# Patient Record
Sex: Female | Born: 1935
Health system: Southern US, Community
[De-identification: ages and names within clinical notes are randomized; demographics above are authoritative.]

## PROBLEM LIST (undated history)

## (undated) DIAGNOSIS — Z8619 Personal history of other infectious and parasitic diseases: Secondary | ICD-10-CM

## (undated) DIAGNOSIS — I10 Essential (primary) hypertension: Secondary | ICD-10-CM

## (undated) DIAGNOSIS — R001 Bradycardia, unspecified: Secondary | ICD-10-CM

## (undated) DIAGNOSIS — N393 Stress incontinence (female) (male): Secondary | ICD-10-CM

## (undated) DIAGNOSIS — I5189 Other ill-defined heart diseases: Secondary | ICD-10-CM

## (undated) DIAGNOSIS — K810 Acute cholecystitis: Secondary | ICD-10-CM

## (undated) DIAGNOSIS — N179 Acute kidney failure, unspecified: Secondary | ICD-10-CM

## (undated) DIAGNOSIS — M1990 Unspecified osteoarthritis, unspecified site: Secondary | ICD-10-CM

## (undated) DIAGNOSIS — N200 Calculus of kidney: Secondary | ICD-10-CM

## (undated) DIAGNOSIS — N201 Calculus of ureter: Secondary | ICD-10-CM

## (undated) DIAGNOSIS — M069 Rheumatoid arthritis, unspecified: Secondary | ICD-10-CM

## (undated) DIAGNOSIS — I519 Heart disease, unspecified: Secondary | ICD-10-CM

## (undated) DIAGNOSIS — D259 Leiomyoma of uterus, unspecified: Secondary | ICD-10-CM

## (undated) DIAGNOSIS — D649 Anemia, unspecified: Secondary | ICD-10-CM

## (undated) DIAGNOSIS — E876 Hypokalemia: Secondary | ICD-10-CM

## (undated) DIAGNOSIS — K5730 Diverticulosis of large intestine without perforation or abscess without bleeding: Secondary | ICD-10-CM

## (undated) DIAGNOSIS — J302 Other seasonal allergic rhinitis: Secondary | ICD-10-CM

## (undated) DIAGNOSIS — M359 Systemic involvement of connective tissue, unspecified: Secondary | ICD-10-CM

## (undated) DIAGNOSIS — N182 Chronic kidney disease, stage 2 (mild): Secondary | ICD-10-CM

## (undated) DIAGNOSIS — D175 Benign lipomatous neoplasm of intra-abdominal organs: Secondary | ICD-10-CM

## (undated) DIAGNOSIS — M47816 Spondylosis without myelopathy or radiculopathy, lumbar region: Secondary | ICD-10-CM

## (undated) DIAGNOSIS — E782 Mixed hyperlipidemia: Secondary | ICD-10-CM

## (undated) DIAGNOSIS — I499 Cardiac arrhythmia, unspecified: Secondary | ICD-10-CM

## (undated) DIAGNOSIS — B0230 Zoster ocular disease, unspecified: Secondary | ICD-10-CM

## (undated) DIAGNOSIS — K219 Gastro-esophageal reflux disease without esophagitis: Secondary | ICD-10-CM

## (undated) DIAGNOSIS — Z87898 Personal history of other specified conditions: Secondary | ICD-10-CM

## (undated) DIAGNOSIS — M810 Age-related osteoporosis without current pathological fracture: Secondary | ICD-10-CM

## (undated) DIAGNOSIS — E119 Type 2 diabetes mellitus without complications: Secondary | ICD-10-CM

## (undated) HISTORY — DX: Rheumatoid arthritis, unspecified: M06.9

## (undated) HISTORY — DX: Bradycardia, unspecified: R00.1

## (undated) HISTORY — DX: Unspecified osteoarthritis, unspecified site: M19.90

## (undated) HISTORY — DX: Mixed hyperlipidemia: E78.2

## (undated) HISTORY — PX: EYE SURGERY: SHX253

## (undated) HISTORY — PX: POSTERIOR LAMINECTOMY / DECOMPRESSION LUMBAR SPINE: SUR740

## (undated) HISTORY — PX: SHOULDER ARTHROSCOPY WITH DISTAL CLAVICLE RESECTION: SHX5675

## (undated) HISTORY — PX: LUMBAR MICRODISCECTOMY: SHX99

## (undated) HISTORY — DX: Essential (primary) hypertension: I10

## (undated) HISTORY — PX: SPINE SURGERY: SHX786

## (undated) HISTORY — DX: Gastro-esophageal reflux disease without esophagitis: K21.9

## (undated) HISTORY — PX: OTHER SURGICAL HISTORY: SHX169

## (undated) HISTORY — DX: Heart disease, unspecified: I51.9

## (undated) HISTORY — DX: Type 2 diabetes mellitus without complications: E11.9

---

## 1998-08-09 ENCOUNTER — Other Ambulatory Visit: Admission: RE | Admit: 1998-08-09 | Discharge: 1998-08-09 | Payer: Self-pay | Admitting: Obstetrics and Gynecology

## 2000-01-06 HISTORY — PX: OTHER SURGICAL HISTORY: SHX169

## 2000-05-26 ENCOUNTER — Other Ambulatory Visit: Admission: RE | Admit: 2000-05-26 | Discharge: 2000-05-26 | Payer: Self-pay | Admitting: Family Medicine

## 2000-06-23 ENCOUNTER — Encounter (HOSPITAL_COMMUNITY): Admission: RE | Admit: 2000-06-23 | Discharge: 2000-07-23 | Payer: Self-pay | Admitting: Family Medicine

## 2000-07-03 ENCOUNTER — Emergency Department (HOSPITAL_COMMUNITY): Admission: EM | Admit: 2000-07-03 | Discharge: 2000-07-04 | Payer: Self-pay | Admitting: *Deleted

## 2000-07-04 ENCOUNTER — Encounter: Payer: Self-pay | Admitting: *Deleted

## 2000-07-05 ENCOUNTER — Encounter: Payer: Self-pay | Admitting: Family Medicine

## 2000-07-05 ENCOUNTER — Ambulatory Visit (HOSPITAL_COMMUNITY): Admission: RE | Admit: 2000-07-05 | Discharge: 2000-07-05 | Payer: Self-pay | Admitting: Family Medicine

## 2000-07-06 ENCOUNTER — Encounter: Payer: Self-pay | Admitting: Urology

## 2000-07-06 ENCOUNTER — Encounter: Admission: RE | Admit: 2000-07-06 | Discharge: 2000-07-06 | Payer: Self-pay | Admitting: Urology

## 2000-07-11 ENCOUNTER — Emergency Department (HOSPITAL_COMMUNITY): Admission: EM | Admit: 2000-07-11 | Discharge: 2000-07-11 | Payer: Self-pay | Admitting: Emergency Medicine

## 2000-07-14 ENCOUNTER — Encounter: Payer: Self-pay | Admitting: Neurosurgery

## 2000-07-16 ENCOUNTER — Encounter: Payer: Self-pay | Admitting: Neurosurgery

## 2000-07-16 ENCOUNTER — Observation Stay (HOSPITAL_COMMUNITY): Admission: RE | Admit: 2000-07-16 | Discharge: 2000-07-17 | Payer: Self-pay | Admitting: Neurosurgery

## 2000-09-07 ENCOUNTER — Ambulatory Visit (HOSPITAL_COMMUNITY): Admission: RE | Admit: 2000-09-07 | Discharge: 2000-09-07 | Payer: Self-pay | Admitting: *Deleted

## 2000-09-07 ENCOUNTER — Encounter: Payer: Self-pay | Admitting: Family Medicine

## 2002-01-19 ENCOUNTER — Ambulatory Visit (HOSPITAL_COMMUNITY): Admission: RE | Admit: 2002-01-19 | Discharge: 2002-01-19 | Payer: Self-pay | Admitting: Family Medicine

## 2002-01-19 ENCOUNTER — Encounter: Payer: Self-pay | Admitting: Family Medicine

## 2002-04-26 ENCOUNTER — Other Ambulatory Visit: Admission: RE | Admit: 2002-04-26 | Discharge: 2002-04-26 | Payer: Self-pay | Admitting: Dermatology

## 2002-12-05 ENCOUNTER — Ambulatory Visit (HOSPITAL_COMMUNITY): Admission: RE | Admit: 2002-12-05 | Discharge: 2002-12-05 | Payer: Self-pay | Admitting: Orthopedic Surgery

## 2003-01-25 ENCOUNTER — Ambulatory Visit (HOSPITAL_COMMUNITY): Admission: RE | Admit: 2003-01-25 | Discharge: 2003-01-25 | Payer: Self-pay | Admitting: Family Medicine

## 2003-08-02 ENCOUNTER — Ambulatory Visit (HOSPITAL_COMMUNITY): Admission: RE | Admit: 2003-08-02 | Discharge: 2003-08-02 | Payer: Self-pay | Admitting: Family Medicine

## 2003-09-06 ENCOUNTER — Ambulatory Visit (HOSPITAL_COMMUNITY): Admission: RE | Admit: 2003-09-06 | Discharge: 2003-09-06 | Payer: Self-pay | Admitting: Gastroenterology

## 2004-01-28 ENCOUNTER — Ambulatory Visit (HOSPITAL_COMMUNITY): Admission: RE | Admit: 2004-01-28 | Discharge: 2004-01-28 | Payer: Self-pay | Admitting: Family Medicine

## 2004-09-05 ENCOUNTER — Encounter: Payer: Self-pay | Admitting: Family Medicine

## 2005-04-09 ENCOUNTER — Ambulatory Visit (HOSPITAL_COMMUNITY): Admission: RE | Admit: 2005-04-09 | Discharge: 2005-04-09 | Payer: Self-pay | Admitting: Family Medicine

## 2006-04-12 ENCOUNTER — Ambulatory Visit (HOSPITAL_COMMUNITY): Admission: RE | Admit: 2006-04-12 | Discharge: 2006-04-12 | Payer: Self-pay | Admitting: Family Medicine

## 2007-01-06 ENCOUNTER — Encounter: Payer: Self-pay | Admitting: Family Medicine

## 2007-01-06 HISTORY — PX: CATARACT EXTRACTION W/ INTRAOCULAR LENS  IMPLANT, BILATERAL: SHX1307

## 2007-01-06 HISTORY — PX: OTHER SURGICAL HISTORY: SHX169

## 2007-04-21 ENCOUNTER — Ambulatory Visit (HOSPITAL_COMMUNITY): Admission: RE | Admit: 2007-04-21 | Discharge: 2007-04-21 | Payer: Self-pay | Admitting: Family Medicine

## 2007-08-22 ENCOUNTER — Encounter: Payer: Self-pay | Admitting: Family Medicine

## 2008-01-06 HISTORY — PX: EXCISION BONE CYST: SHX6616

## 2008-01-06 HISTORY — PX: OTHER SURGICAL HISTORY: SHX169

## 2008-04-20 ENCOUNTER — Encounter: Payer: Self-pay | Admitting: Family Medicine

## 2008-04-23 ENCOUNTER — Ambulatory Visit (HOSPITAL_COMMUNITY): Admission: RE | Admit: 2008-04-23 | Discharge: 2008-04-23 | Payer: Self-pay | Admitting: Family Medicine

## 2008-05-15 ENCOUNTER — Encounter: Payer: Self-pay | Admitting: Family Medicine

## 2008-05-23 ENCOUNTER — Ambulatory Visit: Payer: Self-pay | Admitting: Family Medicine

## 2008-05-23 DIAGNOSIS — E785 Hyperlipidemia, unspecified: Secondary | ICD-10-CM | POA: Insufficient documentation

## 2008-05-23 DIAGNOSIS — I10 Essential (primary) hypertension: Secondary | ICD-10-CM

## 2008-05-23 DIAGNOSIS — E782 Mixed hyperlipidemia: Secondary | ICD-10-CM | POA: Insufficient documentation

## 2008-05-24 ENCOUNTER — Encounter: Payer: Self-pay | Admitting: Family Medicine

## 2008-05-24 LAB — CONVERTED CEMR LAB
Candida species: NEGATIVE
Chlamydia, DNA Probe: NEGATIVE
GC Probe Amp, Genital: NEGATIVE
Gardnerella vaginalis: NEGATIVE
Trichomonal Vaginitis: NEGATIVE

## 2008-05-30 ENCOUNTER — Encounter: Payer: Self-pay | Admitting: Family Medicine

## 2008-05-31 ENCOUNTER — Encounter: Payer: Self-pay | Admitting: Family Medicine

## 2008-06-29 ENCOUNTER — Encounter: Payer: Self-pay | Admitting: Family Medicine

## 2008-06-29 ENCOUNTER — Other Ambulatory Visit: Admission: RE | Admit: 2008-06-29 | Discharge: 2008-06-29 | Payer: Self-pay | Admitting: Family Medicine

## 2008-06-29 ENCOUNTER — Ambulatory Visit: Payer: Self-pay | Admitting: Family Medicine

## 2008-07-02 ENCOUNTER — Telehealth: Payer: Self-pay | Admitting: Family Medicine

## 2008-07-06 ENCOUNTER — Telehealth: Payer: Self-pay | Admitting: Family Medicine

## 2008-07-16 ENCOUNTER — Encounter: Payer: Self-pay | Admitting: Family Medicine

## 2008-07-25 ENCOUNTER — Ambulatory Visit: Payer: Self-pay | Admitting: Family Medicine

## 2008-08-27 ENCOUNTER — Encounter: Admission: RE | Admit: 2008-08-27 | Discharge: 2008-08-27 | Payer: Self-pay | Admitting: Surgery

## 2008-08-30 ENCOUNTER — Encounter (INDEPENDENT_AMBULATORY_CARE_PROVIDER_SITE_OTHER): Payer: Self-pay | Admitting: Surgery

## 2008-08-30 ENCOUNTER — Ambulatory Visit (HOSPITAL_BASED_OUTPATIENT_CLINIC_OR_DEPARTMENT_OTHER): Admission: RE | Admit: 2008-08-30 | Discharge: 2008-08-30 | Payer: Self-pay | Admitting: Surgery

## 2008-08-30 ENCOUNTER — Encounter: Payer: Self-pay | Admitting: Family Medicine

## 2008-08-31 ENCOUNTER — Telehealth: Payer: Self-pay | Admitting: Family Medicine

## 2008-09-12 ENCOUNTER — Ambulatory Visit: Payer: Self-pay | Admitting: Family Medicine

## 2008-09-16 DIAGNOSIS — M0579 Rheumatoid arthritis with rheumatoid factor of multiple sites without organ or systems involvement: Secondary | ICD-10-CM

## 2008-09-16 DIAGNOSIS — B009 Herpesviral infection, unspecified: Secondary | ICD-10-CM

## 2008-12-06 ENCOUNTER — Encounter: Payer: Self-pay | Admitting: Family Medicine

## 2008-12-07 DIAGNOSIS — R5381 Other malaise: Secondary | ICD-10-CM

## 2008-12-07 DIAGNOSIS — R5383 Other fatigue: Secondary | ICD-10-CM

## 2008-12-14 LAB — CONVERTED CEMR LAB
ALT: 12 units/L (ref 0–35)
AST: 19 units/L (ref 0–37)
Albumin: 4.5 g/dL (ref 3.5–5.2)
Alkaline Phosphatase: 64 units/L (ref 39–117)
CO2: 28 meq/L (ref 19–32)
Calcium: 10.2 mg/dL (ref 8.4–10.5)
Cholesterol: 155 mg/dL (ref 0–200)
Creatinine, Ser: 1.18 mg/dL (ref 0.40–1.20)
HDL: 59 mg/dL (ref >39)
Total Bilirubin: 0.6 mg/dL (ref 0.3–1.2)

## 2008-12-21 ENCOUNTER — Encounter: Payer: Self-pay | Admitting: Family Medicine

## 2008-12-24 ENCOUNTER — Encounter: Payer: Self-pay | Admitting: Family Medicine

## 2008-12-26 ENCOUNTER — Encounter: Payer: Self-pay | Admitting: Family Medicine

## 2009-01-05 HISTORY — PX: OTHER SURGICAL HISTORY: SHX169

## 2009-01-10 ENCOUNTER — Encounter: Payer: Self-pay | Admitting: Family Medicine

## 2009-02-08 ENCOUNTER — Ambulatory Visit: Payer: Self-pay | Admitting: Family Medicine

## 2009-04-22 ENCOUNTER — Ambulatory Visit: Payer: Self-pay | Admitting: Family Medicine

## 2009-04-22 DIAGNOSIS — IMO0002 Reserved for concepts with insufficient information to code with codable children: Secondary | ICD-10-CM

## 2009-05-10 ENCOUNTER — Encounter: Payer: Self-pay | Admitting: Family Medicine

## 2009-05-23 ENCOUNTER — Ambulatory Visit: Payer: Self-pay | Admitting: Family Medicine

## 2009-05-23 DIAGNOSIS — R9431 Abnormal electrocardiogram [ECG] [EKG]: Secondary | ICD-10-CM

## 2009-05-23 DIAGNOSIS — I495 Sick sinus syndrome: Secondary | ICD-10-CM

## 2009-05-23 LAB — CONVERTED CEMR LAB
Albumin: 4.5 g/dL (ref 3.5–5.2)
Alkaline Phosphatase: 71 units/L (ref 39–117)
BUN: 28 mg/dL — ABNORMAL HIGH (ref 6–23)
Bilirubin, Direct: 0.1 mg/dL (ref 0.0–0.3)
CO2: 26 meq/L (ref 19–32)
Chloride: 102 meq/L (ref 96–112)
Creatinine, Ser: 1.35 mg/dL — ABNORMAL HIGH (ref 0.40–1.20)
Eosinophils Absolute: 0.2 10*3/uL (ref 0.0–0.7)
Eosinophils Relative: 3 % (ref 0–5)
Glucose, Bld: 104 mg/dL — ABNORMAL HIGH (ref 70–99)
HCT: 38.3 % (ref 36.0–46.0)
Indirect Bilirubin: 0.5 mg/dL (ref 0.0–0.9)
LDL Cholesterol: 63 mg/dL (ref 0–99)
Lymphs Abs: 1.5 10*3/uL (ref 0.7–4.0)
MCHC: 32.6 g/dL (ref 30.0–36.0)
MCV: 93.2 fL (ref 78.0–100.0)
Monocytes Relative: 8 % (ref 3–12)
Platelets: 161 10*3/uL (ref 150–400)
RBC: 4.11 M/uL (ref 3.87–5.11)
Total Bilirubin: 0.6 mg/dL (ref 0.3–1.2)
WBC: 4.9 10*3/uL (ref 4.0–10.5)

## 2009-05-24 ENCOUNTER — Ambulatory Visit (HOSPITAL_COMMUNITY): Admission: RE | Admit: 2009-05-24 | Discharge: 2009-05-24 | Payer: Self-pay | Admitting: Family Medicine

## 2009-05-24 ENCOUNTER — Encounter: Payer: Self-pay | Admitting: Family Medicine

## 2009-05-28 ENCOUNTER — Encounter: Payer: Self-pay | Admitting: Family Medicine

## 2009-05-28 ENCOUNTER — Telehealth: Payer: Self-pay | Admitting: Family Medicine

## 2009-05-29 ENCOUNTER — Encounter: Payer: Self-pay | Admitting: Family Medicine

## 2009-06-19 ENCOUNTER — Encounter: Payer: Self-pay | Admitting: Family Medicine

## 2009-06-26 ENCOUNTER — Inpatient Hospital Stay (HOSPITAL_COMMUNITY): Admission: RE | Admit: 2009-06-26 | Discharge: 2009-07-01 | Payer: Self-pay | Admitting: Specialist

## 2009-06-27 ENCOUNTER — Encounter: Payer: Self-pay | Admitting: Family Medicine

## 2009-06-27 ENCOUNTER — Ambulatory Visit: Payer: Self-pay | Admitting: Physical Medicine & Rehabilitation

## 2009-07-09 ENCOUNTER — Encounter: Payer: Self-pay | Admitting: Family Medicine

## 2009-07-25 ENCOUNTER — Ambulatory Visit: Payer: Self-pay | Admitting: Family Medicine

## 2009-07-25 DIAGNOSIS — E119 Type 2 diabetes mellitus without complications: Secondary | ICD-10-CM | POA: Insufficient documentation

## 2009-08-02 ENCOUNTER — Telehealth: Payer: Self-pay | Admitting: Family Medicine

## 2009-08-13 ENCOUNTER — Encounter: Payer: Self-pay | Admitting: Family Medicine

## 2009-08-28 ENCOUNTER — Ambulatory Visit: Payer: Self-pay | Admitting: Family Medicine

## 2009-08-28 DIAGNOSIS — M6281 Muscle weakness (generalized): Secondary | ICD-10-CM

## 2009-09-03 ENCOUNTER — Encounter (HOSPITAL_COMMUNITY): Admission: RE | Admit: 2009-09-03 | Discharge: 2009-10-03 | Payer: Self-pay | Admitting: Family Medicine

## 2009-09-03 ENCOUNTER — Encounter: Payer: Self-pay | Admitting: Family Medicine

## 2009-10-02 ENCOUNTER — Encounter: Payer: Self-pay | Admitting: Family Medicine

## 2009-10-04 ENCOUNTER — Encounter (HOSPITAL_COMMUNITY): Admission: RE | Admit: 2009-10-04 | Discharge: 2009-10-04 | Payer: Self-pay | Admitting: Family Medicine

## 2009-10-07 ENCOUNTER — Encounter (HOSPITAL_COMMUNITY): Admission: RE | Admit: 2009-10-07 | Discharge: 2009-11-06 | Payer: Self-pay | Admitting: Family Medicine

## 2009-10-21 ENCOUNTER — Encounter: Payer: Self-pay | Admitting: Family Medicine

## 2009-10-24 LAB — CONVERTED CEMR LAB
BUN: 27 mg/dL — ABNORMAL HIGH (ref 6–23)
Chloride: 105 meq/L (ref 96–112)
Glucose, Bld: 101 mg/dL — ABNORMAL HIGH (ref 70–99)
Indirect Bilirubin: 0.5 mg/dL (ref 0.0–0.9)
LDL Cholesterol: 71 mg/dL (ref 0–99)
Potassium: 3.6 meq/L (ref 3.5–5.3)
Total Protein: 6.7 g/dL (ref 6.0–8.3)
Triglycerides: 102 mg/dL (ref <150)
VLDL: 20 mg/dL (ref 0–40)

## 2009-10-28 ENCOUNTER — Ambulatory Visit: Payer: Self-pay | Admitting: Family Medicine

## 2009-11-18 ENCOUNTER — Telehealth: Payer: Self-pay | Admitting: Family Medicine

## 2009-12-31 ENCOUNTER — Telehealth: Payer: Self-pay | Admitting: Family Medicine

## 2010-01-05 HISTORY — PX: BUNIONECTOMY: SHX129

## 2010-01-26 ENCOUNTER — Encounter: Payer: Self-pay | Admitting: Family Medicine

## 2010-02-04 NOTE — Assessment & Plan Note (Signed)
Summary: ov   Vital Signs:  Patient profile:   77 year old female Menstrual status:  postmenopausal Height:      60 inches Weight:      147 pounds BMI:     28.81 O2 Sat:      99 % Pulse rate:   68 / minute Pulse rhythm:   regular Resp:     16 per minute BP sitting:   112 / 70 Cuff size:   regular  Vitals Entered By: Everitt Amber (February 08, 2009 8:08 AM)  Nutrition Counseling: Patient's BMI is greater than 25 and therefore counseled on weight management options. CC: Follow up chronic problems   CC:  Follow up chronic problems.  History of Present Illness: Reports  that she has been  doing well. since her last visit, she had a 2nd surgery to remove a cyst on her shoulder, this last was successful. Denies recent fever or chills. Denies sinus pressure, nasal congestion , ear pain or sore throat. Denies chest congestion, or cough productive of sputum. Denies chest pain, palpitations, PND, orthopnea or leg swelling. Denies abdominal pain, nausea, vomitting, diarrhea or constipation. Denies change in bowel movements or bloody stool. Denies dysuria , frequency, incontinence or hesitancy. Denies  joint pain, swelling, or reduced mobility. Denies headaches, vertigo, seizures. Denies depression, anxiety or insomnia. Denies  rash, lesions, or itch.     Current Medications (verified): 1)  Crestor 10 Mg Tabs (Rosuvastatin Calcium) .... Take 1 Tablet By Mouth Once A Day 2)  Cardizem La 240 Mg Xr24h-Tab (Diltiazem Hcl Coated Beads) .... Take 1 Tab By Mouth At Bedtime 3)  Meloxicam 15 Mg Tabs (Meloxicam) .... Take 1 Tablet By Mouth Once A Day 4)  Methotrexate 2.5 Mg Tabs (Methotrexate Sodium) .... Take 6 Tabs On Thursdays 5)  Lisinopril-Hydrochlorothiazide 20-25 Mg Tabs (Lisinopril-Hydrochlorothiazide) .... Take 1 Tablet By Mouth Once A Day 6)  Calcium 500/d 500-200 Mg-Unit Tabs (Calcium Carbonate-Vitamin D) .... Take 1 Tablet By Mouth Three Times A Day 7)  Aspir-Low 81 Mg Tbec  (Aspirin) .... Take 1 Tablet By Mouth Once A Day 8)  Acyclovir 400 Mg Tabs (Acyclovir) .... Take 1 Tablet By Mouth Two Times A Day  Allergies (verified): 1)  ! Aspirin  Past History:  Past Surgical History: back surgery- 5 years ago  r cataract surgery   2009  cyst removed from right shoulder 08/30/2008, rept surgery same shoulder 01/10/2009  Review of Systems      See HPI Eyes:  Denies blurring and discharge. Heme:  Denies abnormal bruising and bleeding. Allergy:  Denies hives or rash and itching eyes.  Physical Exam  General:  alert, well-hydrated, and overweight-appearing. HEENT: No facial asymmetry,  EOMI, No sinus tenderness, TM's Clear, oropharynx  pink and moist.   Chest: Clear to auscultation bilaterally.  CVS: S1, S2, No murmurs, No S3.   Abd: Soft, Nontender.  ZO:XWRUEAVWU ROM spine, hips, shoulders and knees.  Ext: No edema.   CNS: CN 2-12 intact, power tone and sensation normal throughout.   Skin: Intact, surgical site on left shoulder is healing well. Psych: Good eye contact, normal affect.  Memory intact, not anxious or depressed appearing.      Impression & Recommendations:  Problem # 1:  RHEUMATOID ARTHRITIS (ICD-714.0) Assessment Unchanged  Problem # 2:  UNSPECIFIED ESSENTIAL HYPERTENSION (ICD-401.9) Assessment: Unchanged  Her updated medication list for this problem includes:    Cardizem La 240 Mg Xr24h-tab (Diltiazem hcl coated beads) .Marland Kitchen... Take 1 tab  by mouth at bedtime    Lisinopril-hydrochlorothiazide 20-25 Mg Tabs (Lisinopril-hydrochlorothiazide) .Marland Kitchen... Take 1 tablet by mouth once a day  BP today: 112/70 Prior BP: 110/70 (09/12/2008)  Labs Reviewed: K+: 3.7 (12/06/2008) Creat: : 1.18 (12/06/2008)   Chol: 155 (12/06/2008)   HDL: 59 (12/06/2008)   LDL: 73 (12/06/2008)   TG: 117 (12/06/2008)  Problem # 3:  HYPERLIPIDEMIA (ICD-272.4) Assessment: Comment Only  Her updated medication list for this problem includes:    Crestor 10 Mg Tabs  (Rosuvastatin calcium) .Marland Kitchen... Take 1 tablet by mouth once a day  Orders: T-Hepatic Function [id number]) T-Lipid Profile [id number])  Labs Reviewed: SGOT: 19 (12/06/2008)   SGPT: 12 (12/06/2008)   HDL:59 (12/06/2008)  LDL:73 (12/06/2008)  Chol:155 (12/06/2008)  Trig:117 (12/06/2008)  Problem # 4:  HSV (ICD-054.9) Assessment: Improved continue prophylactic acyclovir  Complete Medication List: 1)  Crestor 10 Mg Tabs (Rosuvastatin calcium) .... Take 1 tablet by mouth once a day 2)  Cardizem La 240 Mg Xr24h-tab (Diltiazem hcl coated beads) .... Take 1 tab by mouth at bedtime 3)  Meloxicam 15 Mg Tabs (Meloxicam) .... Take 1 tablet by mouth once a day 4)  Methotrexate 2.5 Mg Tabs (Methotrexate sodium) .... Take 6 tabs on thursdays 5)  Lisinopril-hydrochlorothiazide 20-25 Mg Tabs (Lisinopril-hydrochlorothiazide) .... Take 1 tablet by mouth once a day 6)  Calcium 500/d 500-200 Mg-unit Tabs (Calcium carbonate-vitamin d) .... Take 1 tablet by mouth three times a day 7)  Aspir-low 81 Mg Tbec (Aspirin) .... Take 1 tablet by mouth once a day 8)  Acyclovir 400 Mg Tabs (Acyclovir) .... Take 1 tablet by mouth two times a day  Other Orders: T-Basic Metabolic Panel [id number]) T-CBC w/Diff [id number]) T-TSH [id number])  Patient Instructions: 1)  Please schedule a CPE  in 4 .5months. 2)  BMP prior to visit, ICD-9: 3)  Hepatic Panel prior to visit, ICD-9: 4)  Lipid Panel prior to visit, ICD-9:  fasting labs in 4.5 months 5)  TSH prior to visit, ICD-9: 6)  CBC w/ Diff prior to visit, ICD-9: 7)  No med changes at this time, you are doing very well. 8)  PLS remember your Thelma Barge is due in April, pls schedule , if you have probs pls call us. Prescriptions: LISINOPRIL-HYDROCHLOROTHIAZIDE 20-25 MG TABS (LISINOPRIL-HYDROCHLOROTHIAZIDE) Take 1 tablet by mouth once a day  #90 x 2   Entered by:   Everitt Amber   Authorized by:   [name] MD   Signed by:   Everitt Amber on 02/08/2009    Method used:   Printed then faxed to ...       [facility]* (retail)       [address]       [place], [place]  [address]       Ph: [phone]       Fax: [phone]   RxID:   [id number] MELOXICAM 15 MG TABS (MELOXICAM) Take 1 tablet by mouth once a day  #90 x 2   Entered by:   Everitt Amber   Authorized by:   [name] MD   Signed by:   Everitt Amber on 02/08/2009   Method used:   Printed then faxed to ...       [facility]* (retail)       [address]       [place], [place]  [address]       Ph:  [phone]       Fax: [phone]   RxID:   [id number] CARDIZEM LA 240 MG XR24H-TAB (DILTIAZEM HCL COATED BEADS) Take 1 tab by mouth at bedtime  #90 x 2   Entered by:   Everitt Amber   Authorized by:   [name] MD   Signed by:   Everitt Amber on 02/08/2009   Method used:   Printed then faxed to ...       [facility]* (retail)       [address]       [place], [place]  [address]       Ph: [phone]       Fax: [phone]   RxID:   [id number]

## 2010-02-04 NOTE — Medication Information (Signed)
Summary: Tax adviser   Imported By: Lind Guest 04/24/2009 13:06:10  _____________________________________________________________________  External Attachment:    Type:   Image     Comment:   External Document

## 2010-02-04 NOTE — Assessment & Plan Note (Signed)
Summary: OV   Vital Signs:  Patient profile:   77 year old female Menstrual status:  postmenopausal Height:      60 inches Weight:      131.25 pounds BMI:     25.73 O2 Sat:      97 % Pulse rate:   57 / minute Pulse rhythm:   regular Resp:     16 per minute BP sitting:   100 / 60  Vitals Entered By: Everitt Amber LPN (August 28, 2009 8:30 AM)  Nutrition Counseling: Patient's BMI is greater than 25 and therefore counseled on weight management options. CC: Follow up chronic problems, back bothers her when she tries to straighten up and walk    CC:  Follow up chronic problems and back bothers her when she tries to straighten up and walk .  History of Present Illness: Reports  that she has not been doing well Denies recent fever or chills. Denies sinus pressure, nasal congestion , ear pain or sore throat. Denies chest congestion, or cough productive of sputum. Denies chest pain, palpitations, PND, orthopnea or leg swelling. Denies abdominal pain, nausea, vomitting, diarrhea or constipation. Denies change in bowel movements or bloody stool. Denies dysuria , frequency, incontinence or hesitancy. She c/o her back being bent over, and difficulty styraightening it, now wants to go therapy Denies headaches, vertigo, seizures. Denies depression, anxiety or insomnia. Denies  rash, lesions, or itch.     Allergies: 1)  ! Aspirin  Review of Systems      See HPI General:  Complains of fatigue. Eyes:  Complains of vision loss-both eyes; denies blurring, discharge, eye pain, and red eye. MS:  Complains of low back pain; pt reports weakness. Endo:  Denies excessive thirst, excessive urination, and heat intolerance; pt has modified her diet to reduce the risk of developing diabetes. Heme:  Denies abnormal bruising and bleeding. Allergy:  Denies hives or rash and itching eyes.  Physical Exam  General:  Well-developed,well-nourished,in no acute distress; alert,appropriate and cooperative  throughout examination HEENT: No facial asymmetry,  EOMI, No sinus tenderness, TM's Clear, oropharynx  pink and moist.   Chest: Clear to auscultation bilaterally.  CVS: S1, S2, No murmurs, No S3.   Abd: Soft, Nontender.  MS: decreased  ROM spine, hips, shoulders and knees.  Ext: No edema.   CNS: CN 2-12 intact, power tone and sensation normal throughout.   Skin: Intact, no visible lesions or rashes.  Psych: Good eye contact, normal affect.  Memory intact, not anxious or depressed appearing.    Impression & Recommendations:  Problem # 1:  MUSCLE WEAKNESS (GENERALIZED) (ICD-728.87) Assessment Deteriorated  Orders: Physical Therapy Referral (PT)  Problem # 2:  BACK PAIN WITH RADICULOPATHY (ICD-729.2) Assessment: Improved  Problem # 3:  UNSPECIFIED ESSENTIAL HYPERTENSION (ICD-401.9) Assessment: Unchanged  Her updated medication list for this problem includes:    Cardizem La 240 Mg Xr24h-tab (Diltiazem hcl coated beads) .Marland Kitchen... Take 1 tab by mouth at bedtime    Lisinopril-hydrochlorothiazide 20-25 Mg Tabs (Lisinopril-hydrochlorothiazide) .Marland Kitchen... Take 1 tablet by mouth once a day  BP today: 100/60 Prior BP: 110/62 (07/25/2009)  Labs Reviewed: K+: 3.6 (05/23/2009) Creat: : 1.35 (05/23/2009)   Chol: 148 (05/23/2009)   HDL: 67 (05/23/2009)   LDL: 63 (05/23/2009)   TG: 88 (05/23/2009)  Problem # 4:  HYPERLIPIDEMIA (ICD-272.4) Assessment: Unchanged  Her updated medication list for this problem includes:    Crestor 10 Mg Tabs (Rosuvastatin calcium) .Marland Kitchen... Take 1 tablet by mouth once a day  Labs Reviewed: SGOT: 18 (05/23/2009)   SGPT: 13 (05/23/2009)   HDL:67 (05/23/2009), 59 (12/06/2008)  LDL:63 (05/23/2009), 73 (12/06/2008)  Chol:148 (05/23/2009), 155 (12/06/2008)  Trig:88 (05/23/2009), 117 (12/06/2008)  Complete Medication List: 1)  Crestor 10 Mg Tabs (Rosuvastatin calcium) .... Take 1 tablet by mouth once a day 2)  Cardizem La 240 Mg Xr24h-tab (Diltiazem hcl coated beads) ....  Take 1 tab by mouth at bedtime 3)  Meloxicam 15 Mg Tabs (Meloxicam) .... Take 1 tablet by mouth once a day 4)  Methotrexate 2.5 Mg Tabs (Methotrexate sodium) .... Take 6 tabs on thursdays 5)  Lisinopril-hydrochlorothiazide 20-25 Mg Tabs (Lisinopril-hydrochlorothiazide) .... Take 1 tablet by mouth once a day 6)  Calcium 500/d 500-200 Mg-unit Tabs (Calcium carbonate-vitamin d) .... Take 1 tablet by mouth three times a day 7)  Acyclovir 400 Mg Tabs (Acyclovir) .... Take 1 tablet by mouth two times a day 8)  Multivitamins Tabs (Multiple vitamin) .... Take 1 tablet by mouth once a day  Other Orders: Pneumococcal Vaccine ([address]) Admin 1st Vaccine ([address]) Admin 1st Vaccine [facility]) [phone])  Patient Instructions: 1)  F?U as before. 2)  you will get a pneumonia shot today. 3)  pls call and come in mid September for your flu shot. 4)  you will be referred to physical therapy for weakness in the back Prescriptions: MELOXICAM 15 MG TABS (MELOXICAM) Take 1 tablet by mouth once a day  #90 x 0   Entered by:   Everitt Amber LPN   Authorized by:   [name] MD   Signed by:   Everitt Amber LPN on 14/78/2956   Method used:   Printed then faxed to ...       [facility] (mail-order)             , [place]         Ph: [phone]       Fax: [phone]   RxID:   [id number] METHOTREXATE 2.5 MG TABS (METHOTREXATE SODIUM) Take 6 tabs on thursdays  #72 x 0   Entered by:   Everitt Amber LPN   Authorized by:   [name] MD   Signed by:   Everitt Amber LPN on 03/47/4259   Method used:   Printed then faxed to ...       [facility] (mail-order)             , [place]         Ph: [phone]       Fax: [phone]   RxID:   [id number] CARDIZEM LA 240 MG XR24H-TAB (DILTIAZEM HCL COATED BEADS) Take 1 tab by mouth at bedtime  #90 x 1   Entered by:   Everitt Amber LPN   Authorized by:   [name] MD   Signed by:   Everitt Amber LPN on 32/20/2542   Method used:   Printed then faxed to ...       [facility]  (mail-order)             , [place]         Ph: [phone]       Fax: [phone]   RxID:   [id number] CRESTOR 10 MG TABS (ROSUVASTATIN CALCIUM) Take 1 tablet by mouth once a day  #90 x 1   Entered by:   Everitt Amber LPN   Authorized by:   [name] MD   Signed by:   Everitt Amber LPN on 50/09/3816   Method used:   Printed then faxed  to .Marland KitchenMarland Kitchen       [facility] (mail-order)             , [place]         Ph: [phone]       Fax: [phone]   RxID:   [id number]     Pneumovax    Vaccine Type: Pneumovax    Site: right deltoid    Mfr: Merck    Dose: 0.5 ml    Route: IM    Given by: Everitt Amber LPN    Exp. Date: 01/22/2011    Lot #: [id number]

## 2010-02-04 NOTE — Letter (Signed)
Summary: PHYSICAL THERAPY  PHYSICAL THERAPY   Imported By: Lind Guest 10/08/2009 08:30:07  _____________________________________________________________________  External Attachment:    Type:   Image     Comment:   External Document

## 2010-02-04 NOTE — Assessment & Plan Note (Signed)
Summary: office visit   Vital Signs:  Patient profile:   77 year old female Menstrual status:  postmenopausal Height:      60 inches Weight:      131.75 pounds BMI:     25.82 O2 Sat:      98 % on Room air Pulse rate:   57 / minute Pulse rhythm:   regular Resp:     16 per minute BP sitting:   140 / 80  (left arm)  Vitals Entered By: Adella Hare LPN (April 22, 2009 11:18 AM)  Nutrition Counseling: Patient's BMI is greater than 25 and therefore counseled on weight management options.  O2 Flow:  Room air CC: arthritis pain left leg from ankle to groin Is Patient Diabetic? No Pain Assessment Patient in pain? yes     Location: left leg Intensity: 9 Type: sharp Onset of pain  With activity   CC:  arthritis pain left leg from ankle to groin.  History of Present Illness: 1 week h/o low back pain radiating down right leg. no inciting factor noted. Pt has had back surgery in the past. She states she has been using a cane for the past week, and is requesting a walker with a bench to assist her mobility, as she feels unsafe walking for any length of time. She can recall no inciting incident to bring on these symptoms. She denies head or chest congestion. Shee denies incontinence of stool or urine   Current Medications (verified): 1)  Crestor 10 Mg Tabs (Rosuvastatin Calcium) .... Take 1 Tablet By Mouth Once A Day 2)  Cardizem La 240 Mg Xr24h-Tab (Diltiazem Hcl Coated Beads) .... Take 1 Tab By Mouth At Bedtime 3)  Meloxicam 15 Mg Tabs (Meloxicam) .... Take 1 Tablet By Mouth Once A Day 4)  Methotrexate 2.5 Mg Tabs (Methotrexate Sodium) .... Take 6 Tabs On Thursdays 5)  Lisinopril-Hydrochlorothiazide 20-25 Mg Tabs (Lisinopril-Hydrochlorothiazide) .... Take 1 Tablet By Mouth Once A Day 6)  Calcium 500/d 500-200 Mg-Unit Tabs (Calcium Carbonate-Vitamin D) .... Take 1 Tablet By Mouth Three Times A Day 7)  Acyclovir 400 Mg Tabs (Acyclovir) .... Take 1 Tablet By Mouth Two Times A  Day  Allergies (verified): 1)  ! Aspirin  Review of Systems      See HPI General:  Complains of fatigue and weakness; denies chills and fever. Eyes:  Denies blurring and discharge. ENT:  Denies hoarseness, nasal congestion, sinus pressure, and sore throat. CV:  Denies chest pain or discomfort, palpitations, and swelling of feet. Resp:  Denies cough and sputum productive. GI:  Denies abdominal pain, constipation, diarrhea, nausea, and vomiting; asprin causeing abd pain only, states she takes ibuprofen with no problem. GU:  Denies dysuria and urinary frequency. MS:  Complains of low back pain; acute low back pain radiating to right leg. Psych:  Denies anxiety and depression. Heme:  Denies abnormal bruising and bleeding. Allergy:  Complains of seasonal allergies.  Physical Exam  General:  alert, well-hydrated, and overweight-appearing.Pt ambulating with a cane and is limping HEENT: No facial asymmetry,  EOMI, No sinus tenderness, TM's Clear, oropharynx  pink and moist.   Chest: Clear to auscultation bilaterally.  CVS: S1, S2, No murmurs, No S3.   Abd: Soft, Nontender.  ZO:XWRUEAVWU ROM spine,adequate in left hip, decreased in right hip, adequate in  shoulders and knees.  Ext: No edema.   CNS: CN 2-12 intact, power tone and sensation normal throughout.   Skin: Intact, surgical site on left shoulder is  healing well. Psych: Good eye contact, normal affect.  Memory intact, not anxious or depressed appearing.      Impression & Recommendations:  Problem # 1:  BACK PAIN WITH RADICULOPATHY (ICD-729.2) Assessment Deteriorated  Orders: Depo- Medrol 80mg  (J1040) Ketorolac-Toradol 15mg  (N8295) Admin of Therapeutic Inj  intramuscular or subcutaneous ([address]) ibuprofen and prednisopne dose pack sent in, script for walker with wheels provided, pt to call if symptoms worsen or do not resolve within a reasonable time  Problem # 2:  UNSPECIFIED ESSENTIAL HYPERTENSION (ICD-401.9) Assessment:  Deteriorated  Her updated medication list for this problem includes:    Cardizem La 240 Mg Xr24h-tab (Diltiazem hcl coated beads) .Marland Kitchen... Take 1 tab by mouth at bedtime    Lisinopril-hydrochlorothiazide 20-25 Mg Tabs (Lisinopril-hydrochlorothiazide) .Marland Kitchen... Take 1 tablet by mouth once a day  BP today: 140/80 Prior BP: 112/70 (02/08/2009)  Labs Reviewed: K+: 3.7 (12/06/2008) Creat: : 1.18 (12/06/2008)   Chol: 155 (12/06/2008)   HDL: 59 (12/06/2008)   LDL: 73 (12/06/2008)   TG: 117 (12/06/2008)  Problem # 3:  HYPERLIPIDEMIA (ICD-272.4) Assessment: Comment Only  Her updated medication list for this problem includes:    Crestor 10 Mg Tabs (Rosuvastatin calcium) .Marland Kitchen... Take 1 tablet by mouth once a day  Labs Reviewed: SGOT: 19 (12/06/2008)   SGPT: 12 (12/06/2008), will need rept labs in2 months   HDL:59 (12/06/2008)  LDL:73 (12/06/2008)  Chol:155 (12/06/2008)  Trig:117 (12/06/2008)  Complete Medication List: 1)  Crestor 10 Mg Tabs (Rosuvastatin calcium) .... Take 1 tablet by mouth once a day 2)  Cardizem La 240 Mg Xr24h-tab (Diltiazem hcl coated beads) .... Take 1 tab by mouth at bedtime 3)  Meloxicam 15 Mg Tabs (Meloxicam) .... Take 1 tablet by mouth once a day 4)  Methotrexate 2.5 Mg Tabs (Methotrexate sodium) .... Take 6 tabs on thursdays 5)  Lisinopril-hydrochlorothiazide 20-25 Mg Tabs (Lisinopril-hydrochlorothiazide) .... Take 1 tablet by mouth once a day 6)  Calcium 500/d 500-200 Mg-unit Tabs (Calcium carbonate-vitamin d) .... Take 1 tablet by mouth three times a day 7)  Acyclovir 400 Mg Tabs (Acyclovir) .... Take 1 tablet by mouth two times a day 8)  Prednisone (pak) 5 Mg Tabs (Prednisone) .... Use as directed 9)  Ibuprofen 600 Mg Tabs (Ibuprofen) .... Take 1 tablet by mouth three times a day  Patient Instructions: 1)  Please schedule a follow-up appointment in 1 month. 2)  Orvis Brill will get injections today and meds are also sent in. 3)  pls call if  your symptoms are no better in the  next 3 days. Prescriptions: ACYCLOVIR 400 MG TABS (ACYCLOVIR) Take 1 tablet by mouth two times a day  #180 x 0   Entered by:   Adella Hare LPN   Authorized by:   [name] MD   Signed by:   Adella Hare LPN on 86/57/8469   Method used:   Electronically to        [facility]* (mail-order)             ,          Ph: [phone]       Fax: [phone]   RxID:   [id number] CALCIUM 500/D 500-200 MG-UNIT TABS (CALCIUM CARBONATE-VITAMIN D) Take 1 tablet by mouth three times a day  #90 x 11   Entered by:   Adella Hare LPN   Authorized by:   [name] MD   Signed by:   Adella Hare LPN on 75/64/3329   Method used:  Electronically to        [facility]* (mail-order)             ,          Ph: [phone]       Fax: [phone]   RxID:   [id number] LISINOPRIL-HYDROCHLOROTHIAZIDE 20-25 MG TABS (LISINOPRIL-HYDROCHLOROTHIAZIDE) Take 1 tablet by mouth once a day  #90 x 0   Entered by:   Adella Hare LPN   Authorized by:   [name] MD   Signed by:   Adella Hare LPN on 25/85/2778   Method used:   Electronically to        [facility]* (mail-order)             ,          Ph: [phone]       Fax: [phone]   RxID:   [id number] METHOTREXATE 2.5 MG TABS (METHOTREXATE SODIUM) Take 6 tabs on thursdays  #72 x 0   Entered by:   Adella Hare LPN   Authorized by:   [name] MD   Signed by:   Adella Hare LPN on 98/33/8250   Method used:   Electronically to        [facility]* (mail-order)             ,          Ph: [phone]       Fax: [phone]   RxID:   [id number] MELOXICAM 15 MG TABS (MELOXICAM) Take 1 tablet by mouth once a day  #90 x 0   Entered by:   Adella Hare LPN   Authorized by:   [name] MD   Signed by:   Adella Hare LPN on 22/97/9892   Method used:   Electronically to        [facility]* (mail-order)             ,          Ph: [phone]       Fax: [phone]   RxID:    [id number] CARDIZEM LA 240 MG XR24H-TAB (DILTIAZEM HCL COATED BEADS) Take 1 tab by mouth at bedtime  #90 x 0   Entered by:   Adella Hare LPN   Authorized by:   [name] MD   Signed by:   Adella Hare LPN on 41/28/7867   Method used:   Electronically to        [facility]* (mail-order)             ,          Ph: [phone]       Fax: [phone]   RxID:   [id number] CRESTOR 10 MG TABS (ROSUVASTATIN CALCIUM) Take 1 tablet by mouth once a day  #90 x 0   Entered by:   Adella Hare LPN   Authorized by:   [name] MD   Signed by:   Adella Hare LPN on 17/00/1749   Method used:   Electronically to        [facility]* (mail-order)             ,          Ph: [phone]       Fax: [phone]   RxID:   [id number] IBUPROFEN 600 MG TABS (IBUPROFEN) Take 1 tablet by mouth three times a day  #30 x 0   Entered and Authorized by:   [name] MD  Signed by:   [name] MD on 04/22/2009   Method used:   Electronically to        [facility]* (retail)       [address]       [place], [place]  [address]       Ph: [phone]       Fax: [phone]   RxID:   [id number] PREDNISONE (PAK) 5 MG TABS (PREDNISONE) Use as directed  #21 x 0   Entered and Authorized by:   [name] MD   Signed by:   [name] MD on 04/22/2009   Method used:   Electronically to        [facility]* (retail)       [address]       [place], [place]  [address]       Ph: [phone]       Fax: [phone]   RxID:   [id number]    Medication Administration  Injection # 1:    Medication: Depo- Medrol 80mg     Diagnosis: BACK PAIN WITH RADICULOPATHY (ICD-729.2)    Route: IM    Site: RUOQ gluteus    Exp Date: 11/11    Lot #: [id number]    Mfr: [employer]    Patient tolerated injection without complications    Given by: Adella Hare LPN (April 22, 2009 12:00  PM)  Injection # 2:    Medication: Ketorolac-Toradol 15mg     Diagnosis: BACK PAIN WITH RADICULOPATHY (ICD-729.2)    Route: IM    Site: LUOQ gluteus    Exp Date: 12/06/2010    Lot #: [id number]    Mfr: hospira    Comments: toradol 60mg  given    Patient tolerated injection without complications    Given by: Adella Hare LPN (April 22, 2009 12:01 PM)  Orders Added: 1)  Est. Patient Level IV [01093] 2)  Depo- Medrol 80mg  [J1040] 3)  Ketorolac-Toradol 15mg  [J1885] 4)  Admin of Therapeutic Inj  intramuscular or subcutaneous [23557]

## 2010-02-04 NOTE — Letter (Signed)
Summary: walker  walker   Imported By: Lind Guest 05/29/2009 08:15:10  _____________________________________________________________________  External Attachment:    Type:   Image     Comment:   External Document

## 2010-02-04 NOTE — Progress Notes (Signed)
Summary: concerned for sister  Phone Note Call from Patient   Summary of Call: patient called and would like to discuss her sister that is in [place], she states she has a bad cold, she states she has a deep cold in her chest, [facility] Records, please call Mrs. Backstrom at [phone].  Initial call taken by: Curtis Sites,  August 02, 2009 8:58 AM  Follow-up for Phone Call        pls ask her respectfully to b4ing this to the nurse's attention and ask that they discuss with her doc over thereDr. Felecia Shelling, i am unable too prescribe for lillie since she is under his care there Follow-up by: [name] MD,  August 02, 2009 12:25 PM  Additional Follow-up for Phone Call Additional follow up Details #1::        She is aware and will talk to the nurses and fanta Additional Follow-up by: Everitt Amber LPN,  August 02, 2009 1:58 PM

## 2010-02-04 NOTE — Progress Notes (Signed)
Summary: [facility]  [facility]   Imported By: Lind Guest 08/16/2009 15:43:38  _____________________________________________________________________  External Attachment:    Type:   Image     Comment:   External Document

## 2010-02-04 NOTE — Op Note (Signed)
Summary: [facility]  [facility]   Imported By: Lind Guest 02/12/2009 11:24:42  _____________________________________________________________________  External Attachment:    Type:   Image     Comment:   External Document

## 2010-02-04 NOTE — Letter (Signed)
Summary: narcotic contract  narcotic contract   Imported By: Lind Guest 05/27/2009 09:31:36  _____________________________________________________________________  External Attachment:    Type:   Image     Comment:   External Document

## 2010-02-04 NOTE — Progress Notes (Signed)
[facility]: [facility]   Imported By: Lind Guest 07/17/2009 11:29:30  _____________________________________________________________________  External Attachment:    Type:   Image     Comment:   External Document

## 2010-02-04 NOTE — Assessment & Plan Note (Signed)
Summary: office visit   Vital Signs:  Patient profile:   77 year old female Menstrual status:  postmenopausal Height:      60 inches Weight:      135.50 pounds O2 Sat:      97 % on Room air Pulse rate:   72 / minute Pulse rhythm:   regular Resp:     16 per minute BP sitting:   108 / 60  (left arm)  Vitals Entered By: Mauricia Area CMA (October 28, 2009 9:26 AM)  O2 Flow:  Room air CC: follow up   CC:  follow up.  History of Present Illness: Reports  thatshe has been doing well. Denies recent fever or chills. Denies sinus pressure, nasal congestion , ear pain or sore throat. Denies chest congestion, or cough productive of sputum. Denies chest pain, palpitations, PND, orthopnea or leg swelling. Denies abdominal pain, nausea, vomitting, diarrhea or constipation. Denies change in bowel movements or bloody stool. Denies dysuria , frequency, incontinence or hesitancy. Reports that her back pain is greatly improved since both the surgery and her reent therapy. Denies headaches, vertigo, seizures. Denies depression, anxiety or insomnia. Denies  rash, lesions, or itch.     Current Medications (verified): 1)  Crestor 10 Mg Tabs (Rosuvastatin Calcium) .... Take 1 Tablet By Mouth Once A Day 2)  Cardizem La 240 Mg Xr24h-Tab (Diltiazem Hcl Coated Beads) .... Take 1 Tab By Mouth At Bedtime 3)  Meloxicam 15 Mg Tabs (Meloxicam) .... Take 1 Tablet By Mouth Once A Day 4)  Methotrexate 2.5 Mg Tabs (Methotrexate Sodium) .... Take 6 Tabs On Thursdays 5)  Lisinopril-Hydrochlorothiazide 20-25 Mg Tabs (Lisinopril-Hydrochlorothiazide) .... Take 1 Tablet By Mouth Once A Day 6)  Calcium 500/d 500-200 Mg-Unit Tabs (Calcium Carbonate-Vitamin D) .... Take 1 Tablet By Mouth Three Times A Day 7)  Acyclovir 400 Mg Tabs (Acyclovir) .... Take 1 Tablet By Mouth Two Times A Day 8)  Multivitamins  Tabs (Multiple Vitamin) .... Take 1 Tablet By Mouth Once A Day  Allergies (verified): 1)  ! Aspirin  Review  of Systems      See HPI General:  Complains of weakness. Eyes:  Denies eye pain and red eye. MS:  Complains of joint pain, low back pain, muscle aches, and stiffness. Endo:  Denies excessive thirst and excessive urination. Heme:  Denies abnormal bruising and bleeding. Allergy:  Denies hives or rash and itching eyes.  Physical Exam  General:  Well-developed,well-nourished,in no acute distress; alert,appropriate and cooperative throughout examination HEENT: No facial asymmetry,  EOMI, No sinus tenderness, TM's Clear, oropharynx  pink and moist.   Chest: Clear to auscultation bilaterally.  CVS: S1, S2, No murmurs, No S3.   Abd: Soft, Nontender.  MS: Decreased  ROM spine,and normal in  hips, shoulders and knees.  Ext: No edema.   CNS: CN 2-12 intact, power tone and sensation normal throughout.   Skin: Intact, no visible lesions or rashes.  Psych: Good eye contact, normal affect.  Memory intact, not anxious or depressed appearing.    Impression & Recommendations:  Problem # 1:  BACK PAIN WITH RADICULOPATHY (ICD-729.2) Assessment Improved  Problem # 2:  HYPERLIPIDEMIA (ICD-272.4) Assessment: Unchanged  Her updated medication list for this problem includes:    Crestor 10 Mg Tabs (Rosuvastatin calcium) .Marland Kitchen... Take 1 tablet by mouth once a day  Orders: T-Lipid Profile [id number]) T-Hepatic Function [phone])  Labs Reviewed: SGOT: 24 (10/24/2009)   SGPT: 18 (10/24/2009)   HDL:66 (10/24/2009), 67 (05/23/2009)  LDL:71 (  10/24/2009), 63 (05/23/2009)  Chol:157 (10/24/2009), 148 (05/23/2009)  Trig:102 (10/24/2009), 88 (05/23/2009)  Problem # 3:  RHEUMATOID ARTHRITIS (ICD-714.0) Assessment: Unchanged  Orders: Medicare Electronic Prescription (H4742)  Problem # 4:  UNSPECIFIED ESSENTIAL HYPERTENSION (ICD-401.9) Assessment: Unchanged  Her updated medication list for this problem includes:    Cardizem La 240 Mg Xr24h-tab (Diltiazem hcl coated beads) .Marland Kitchen... Take 1 tab by mouth at  bedtime    Lisinopril-hydrochlorothiazide 20-25 Mg Tabs (Lisinopril-hydrochlorothiazide) .Marland Kitchen... Take 1 tablet by mouth once a day  Orders: T-Basic Metabolic Panel [phone])  BP today: 108/60 Prior BP: 100/60 (08/28/2009)  Labs Reviewed: K+: 3.6 (10/24/2009) Creat: : 1.04 (10/24/2009)   Chol: 157 (10/24/2009)   HDL: 66 (10/24/2009)   LDL: 71 (10/24/2009)   TG: 102 (10/24/2009)  Problem # 5:  IMPAIRED FASTING GLUCOSE (ICD-790.21) Assessment: Improved  Labs Reviewed: Creat: 1.04 (10/24/2009)     Complete Medication List: 1)  Crestor 10 Mg Tabs (Rosuvastatin calcium) .... Take 1 tablet by mouth once a day 2)  Cardizem La 240 Mg Xr24h-tab (Diltiazem hcl coated beads) .... Take 1 tab by mouth at bedtime 3)  Meloxicam 15 Mg Tabs (Meloxicam) .... Take 1 tablet by mouth once a day 4)  Methotrexate 2.5 Mg Tabs (Methotrexate sodium) .... Take 6 tabs on thursdays 5)  Lisinopril-hydrochlorothiazide 20-25 Mg Tabs (Lisinopril-hydrochlorothiazide) .... Take 1 tablet by mouth once a day 6)  Calcium 500/d 500-200 Mg-unit Tabs (Calcium carbonate-vitamin d) .... Take 1 tablet by mouth three times a day 7)  Acyclovir 400 Mg Tabs (Acyclovir) .... Take 1 tablet by mouth two times a day 8)  Multivitamins Tabs (Multiple vitamin) .... Take 1 tablet by mouth once a day  Other Orders: T-TSH [phone]) T-Vitamin D (25-Hydroxy) [id number]) Influenza Vaccine NON MCR [phone])  Patient Instructions: 1)  F/U end February or early March. 2)  You are not diabetic, but you still need to watch and cut back on sweets and carbs. 3)  Your cholesterol is great, pls cut back th crestor to one tablet at BEDTIME on Monday, Wednesday, Friday and Sunday only 4)  Pls follow a low fat diet. 5)  I am very happy that you are pain free. Prescriptions: METHOTREXATE 2.5 MG TABS (METHOTREXATE SODIUM) Take 6 tabs on thursdays  #72 x 3   Entered by:   Jaime Boothe LPN   Authorized by:     MD   Signed by:    Jaime Boothe LPN on 10/28/2009   Method used:   Electronically to        [facility]* (retail)       [address]       [place], [place]  [address]       Ph: [phone]       Fax: [phone]   RxID:   [id number]    Orders Added: 1)  Est. Patient Level IV [46270] 2)  Medicare Electronic Prescription [G8553] 3)  T-Basic Metabolic Panel [phone] 4)  T-Lipid Profile [[id number]] 5)  T-Hepatic Function [[id number]] 6)  T-TSH [[id number]] 7)  T-Vitamin D (25-Hydroxy) [[id number]] 8)  Influenza Vaccine NON MCR [00028]   Immunizations Administered:  Influenza Vaccine # 1:    Vaccine Type: Fluvax Non-MCR    Site: right deltoid    Mfr: novartis    Dose: 0.5 ml    Route: IM    Given by: Adella Hare LPN    Exp. Date: 05/2010    Lot #: [id number]  VIS given: 07/30/09 version given October 28, 2009.   Immunizations Administered:  Influenza Vaccine # 1:    Vaccine Type: Fluvax Non-MCR    Site: right deltoid    Mfr: novartis    Dose: 0.5 ml    Route: IM    Given by: Adella Hare LPN    Exp. Date: 05/2010    Lot #: [id number]    VIS given: 07/30/09 version given October 28, 2009.

## 2010-02-04 NOTE — Assessment & Plan Note (Signed)
Summary: OV   Vital Signs:  Patient profile:   77 year old female Menstrual status:  postmenopausal Height:      60 inches Weight:      143.25 pounds Pulse rate:   45 / minute Pulse rhythm:   regular BP sitting:   110 / 70  (right arm)  Vitals Entered By: Everitt Amber LPN (May 23, 2009 3:16 PM)  History of Present Illness: Pt is in today crying stating she has been having uncontrolled pain from back disease with nerve compression. She has been to ortho who attempted a prednisone taper reportedly with no result , and was subsequently referred to neurosurg, who has advised her that she needs surgery. She has been unable to sleep because of pain. She is here for medical clearance for surgery.  Allergies: 1)  ! Aspirin  Review of Systems      See HPI General:  Complains of fatigue, malaise, and sleep disorder. Eyes:  Denies discharge. ENT:  Denies nasal congestion, sinus pressure, and sore throat. CV:  Denies chest pain or discomfort, difficulty breathing while lying down, palpitations, shortness of breath with exertion, and swelling of feet. Resp:  Denies cough and sputum productive. GI:  Denies abdominal pain, constipation, diarrhea, nausea, and vomiting. GU:  Denies dysuria and urinary frequency. MS:  Complains of low back pain, mid back pain, and muscle weakness. Derm:  Denies itching, lesion(s), and rash. Neuro:  Denies headaches, seizures, and sensation of room spinning. Psych:  Complains of anxiety and depression; denies suicidal thoughts/plans, thoughts of violence, and unusual visions or sounds; due to uncontrolled pain. Endo:  Denies excessive thirst and excessive urination. Heme:  Denies abnormal bruising, bleeding, and enlarge lymph nodes. Allergy:  Denies hives or rash, itching eyes, and seasonal allergies.  Physical Exam  General:  Well-developed,well-nourished,in no acute distress; alertpt crying due to pain. HEENT: No facial asymmetry,  EOMI, No sinus  tenderness, TM's Clear, oropharynx  pink and moist.   Chest: Clear to auscultation bilaterally.  CVS: S1, S2, No murmurs, No S3.   Abd: Soft, Nontender.  ZO:[id number]  ROM spine, hips, shoulders and knees.  Ext: No edema.   CNS: CN 2-12 intact, power tone and sensation normal throughout.   Skin: Intact, no visible lesions or rashes.  Psych: Good eye contact, normal affect.  Memory intact, not anxious or depressed appearing.    Impression & Recommendations:  Problem # 1:  BACK PAIN WITH RADICULOPATHY (ICD-729.2) Assessment Deteriorated needs preop clearance for surgery  Problem # 2:  SINUS BRADYCARDIA (ICD-427.81) Assessment: Comment Only card to see pt for med clearance  Problem # 3:  UNSPECIFIED ESSENTIAL HYPERTENSION (ICD-401.9) Assessment: Comment Only  Her updated medication list for this problem includes:    Cardizem La 240 Mg Xr24h-tab (Diltiazem hcl coated beads) .Marland Kitchen... Take 1 tab by mouth at bedtime    Lisinopril-hydrochlorothiazide 20-25 Mg Tabs (Lisinopril-hydrochlorothiazide) .Marland Kitchen... Take 1 tablet by mouth once a day  Orders: EKG w/ Interpretation (93000)  sinus brady with non specific tst changes CXR- 2view (CXR) Cardiology Referral (Cardiology)  Problem # 4:  RHEUMATOID ARTHRITIS (ICD-714.0) Assessment: Unchanged continue methotrexate  Problem # 5:  HSV (ICD-054.9) Assessment: Comment Only continue acyclovir daily for prevention  Complete Medication List: 1)  Crestor 10 Mg Tabs (Rosuvastatin calcium) .... Take 1 tablet by mouth once a day 2)  Cardizem La 240 Mg Xr24h-tab (Diltiazem hcl coated beads) .... Take 1 tab by mouth at bedtime 3)  Meloxicam 15 Mg Tabs (Meloxicam) .Marland KitchenMarland KitchenMarland Kitchen  Take 1 tablet by mouth once a day 4)  Methotrexate 2.5 Mg Tabs (Methotrexate sodium) .... Take 6 tabs on thursdays 5)  Lisinopril-hydrochlorothiazide 20-25 Mg Tabs (Lisinopril-hydrochlorothiazide) .... Take 1 tablet by mouth once a day 6)  Calcium 500/d 500-200 Mg-unit Tabs (Calcium  carbonate-vitamin d) .... Take 1 tablet by mouth three times a day 7)  Acyclovir 400 Mg Tabs (Acyclovir) .... Take 1 tablet by mouth two times a day 8)  Tramadol Hcl 50 Mg Tabs (Tramadol hcl) .... Take 1 tab by mouth at bedtime  as needed 9)  Duragesic-25 25 Mcg/hr Pt72 (Fentanyl) .... Change every 3 days  Other Orders: T-Basic Metabolic Panel [phone]) T-Hepatic Function [phone]) T-Lipid Profile [phone]) T-CBC w/Diff [phone]) T-Vitamin D (25-Hydroxy) [phone])  Patient Instructions: 1)  f/u in 6 weeks. 2)  you will be referred to cardiology for further evaluation. 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       [facility]* (retail)       [address]       [place], [place]  [address]       Ph: [phone]       Fax: [phone]   RxID:   [id number]

## 2010-02-04 NOTE — Miscellaneous (Signed)
Summary: NARC REFILL  Clinical Lists Changes  Medications: Rx of DURAGESIC-25 25 MCG/HR PT72 (FENTANYL) change every 3 days;  #10 x 0;  Signed;  Entered by: Everitt Amber LPN;  Authorized by: [name] MD;  Method used: Handwritten    Prescriptions: DURAGESIC-25 25 MCG/HR PT72 (FENTANYL) change every 3 days  #10 x 0   Entered by:   Everitt Amber LPN   Authorized by:   [name] MD   Signed by:   Everitt Amber LPN on 62/95/2841   Method used:   Handwritten   RxID:   [id number]

## 2010-02-04 NOTE — Consult Note (Signed)
Summary: Consultation Report  Consultation Report   Imported By: Lind Guest 07/02/2009 13:00:34  _____________________________________________________________________  External Attachment:    Type:   Image     Comment:   External Document

## 2010-02-04 NOTE — Progress Notes (Signed)
Summary: medication  Phone Note Call from Patient   Summary of Call: patient needs her Acyclovir 400 mg and methotrexatc 2.5 mg called into [facility] ASAP please.  There number is [phone] Initial call taken by: Curtis Sites,  November 18, 2009 4:14 PM    Prescriptions: ACYCLOVIR 400 MG TABS (ACYCLOVIR) Take 1 tablet by mouth two times a day  #180 x 0   Entered by:   Adella Hare LPN   Authorized by:   [name] MD   Signed by:   Adella Hare LPN on 84/69/6295   Method used:   Faxed to ...       [facility] (mail-order)             , [place]         Ph: [phone]       Fax: [phone]   RxID:   [id number] METHOTREXATE 2.5 MG TABS (METHOTREXATE SODIUM) Take 6 tabs on thursdays  #72 x 0   Entered by:   Adella Hare LPN   Authorized by:   [name] MD   Signed by:   Adella Hare LPN on 33/29/5188   Method used:   Faxed to ...       [facility] (mail-order)             , [place]         Ph: [phone]       Fax: [phone]   RxID:   [id number]

## 2010-02-04 NOTE — Assessment & Plan Note (Signed)
Summary: F UP   Vital Signs:  Patient profile:   77 year old female Menstrual status:  postmenopausal Height:      60 inches Weight:      135.50 pounds BMI:     26.56 O2 Sat:      96 % Pulse rate:   54 / minute Pulse rhythm:   regular Resp:     16 per minute BP sitting:   110 / 62 Cuff size:   regular  Vitals Entered By: Everitt Amber LPN (July 25, 2009 9:51 AM)  Nutrition Counseling: Patient's BMI is greater than 25 and therefore counseled on weight management options. CC: Follow up chronic problems   CC:  Follow up chronic problems.  History of Present Illness: Reports  that she has been doing well, following her recent back surgery. She had a few in home rehab sessions, and since then, she has continued to improve.She no longer uses or needs the duragesic patch. During her recent hospitalisation, she was found to have an elevated HBA1C in the prediabetic range, and this needs to be followed closely.She is asymtomatic as far as diabetes is concerned, and of note , in the recent past she has been on rpeated steroid bursts to address her pain issues. Denies recent fever or chills. Denies sinus pressure, nasal congestion , ear pain or sore throat. Denies chest congestion, or cough productive of sputum. Denies chest pain, palpitations, PND, orthopnea or leg swelling. Denies abdominal pain, nausea, vomitting, diarrhea or constipation. Denies change in bowel movements or bloody stool. Denies dysuria , frequency, incontinence or hesitancy.  Denies depression, anxiety or insomnia. Denies  rash, lesions, or itch.     Current Medications (verified): 1)  Crestor 10 Mg Tabs (Rosuvastatin Calcium) .... Take 1 Tablet By Mouth Once A Day 2)  Cardizem La 240 Mg Xr24h-Tab (Diltiazem Hcl Coated Beads) .... Take 1 Tab By Mouth At Bedtime 3)  Meloxicam 15 Mg Tabs (Meloxicam) .... Take 1 Tablet By Mouth Once A Day 4)  Methotrexate 2.5 Mg Tabs (Methotrexate Sodium) .... Take 6 Tabs On  Thursdays 5)  Lisinopril-Hydrochlorothiazide 20-25 Mg Tabs (Lisinopril-Hydrochlorothiazide) .... Take 1 Tablet By Mouth Once A Day 6)  Calcium 500/d 500-200 Mg-Unit Tabs (Calcium Carbonate-Vitamin D) .... Take 1 Tablet By Mouth Three Times A Day 7)  Acyclovir 400 Mg Tabs (Acyclovir) .... Take 1 Tablet By Mouth Two Times A Day 8)  Duragesic-25 25 Mcg/hr Pt72 (Fentanyl) .... Change Every 3 Days 9)  Multivitamins  Tabs (Multiple Vitamin) .... Take 1 Tablet By Mouth Once A Day  Allergies (verified): 1)  ! Aspirin  Past History:  Past medical, surgical, family and social histories (including risk factors) reviewed, and no changes noted (except as noted below).  Past Medical History: Hypertension hyperlipidemia iMPAIRED GLUCOSE TOLERANCE 2011  Past Surgical History: back surgery- , 2002 and 2011  r cataract surgery   2009,, and left cataract in 200  cyst removed from right shoulder 08/30/2008, rept surgery same shoulder 01/10/2009 BACK SURGERY 2011  Family History: Reviewed history from 05/23/2008 and no changes required. Mother-deceased-stroke Father-Deceased-Cancer 2 brothers-deceased-was on dialysis for years before death 2 sisters-diabetic  Social History: Reviewed history from 05/23/2008 and no changes required. Married x 54 yrs. one daughter. Never Smoked, second hand Alcohol use-no Drug use-no Retired, tobacco industry  Review of Systems      See HPI Eyes:  Denies discharge and red eye. MS:  Complains of joint pain, low back pain, mid back pain, and stiffness;  markedly improved back pain, has reduced mobility of shoulders , and hips. Endo:  Denies cold intolerance, excessive hunger, excessive thirst, excessive urination, heat intolerance, polyuria, and weight change. Heme:  Denies abnormal bruising and bleeding. Allergy:  Denies hives or rash and itching eyes.  Physical Exam  General:  Well-developed,well-nourished,in no acute distress; alert,appropriate and  cooperative throughout examination HEENT: No facial asymmetry,  EOMI, No sinus tenderness, TM's Clear, oropharynx  pink and moist.   Chest: Clear to auscultation bilaterally.  CVS: S1, S2, No murmurs, No S3.   Abd: Soft, Nontender.  MS: decreased OM spine, hips, shoulders and knees.  Ext: No edema.   CNS: CN 2-12 intact, power tone and sensation normal throughout.   Skin: Intact, no visible lesions or rashes.  Psych: Good eye contact, normal affect.  Memory intact, not anxious or depressed appearing.    Impression & Recommendations:  Problem # 1:  IMPAIRED FASTING GLUCOSE (ICD-790.21) Assessment Comment Only  Orders: T- Hemoglobin A1C ([id number]), pt counselled re the need to reduce carbs and sweets and to lose weight  Problem # 2:  BACK PAIN WITH RADICULOPATHY (ICD-729.2) Assessment: Improved s/p recent back surgery  Problem # 3:  HYPERLIPIDEMIA (ICD-272.4) Assessment: Improved  Her updated medication list for this problem includes:    Crestor 10 Mg Tabs (Rosuvastatin calcium) .Marland Kitchen... Take 1 tablet by mouth once a day  Orders: T-Hepatic Function ([id number]) T-Lipid Profile [phone])  Labs Reviewed: SGOT: 18 (05/23/2009)   SGPT: 13 (05/23/2009)   HDL:67 (05/23/2009), 59 (12/06/2008)  LDL:63 (05/23/2009), 73 (12/06/2008)  Chol:148 (05/23/2009), 155 (12/06/2008)  Trig:88 (05/23/2009), 117 (12/06/2008)  Complete Medication List: 1)  Crestor 10 Mg Tabs (Rosuvastatin calcium) .... Take 1 tablet by mouth once a day 2)  Cardizem La 240 Mg Xr24h-tab (Diltiazem hcl coated beads) .... Take 1 tab by mouth at bedtime 3)  Meloxicam 15 Mg Tabs (Meloxicam) .... Take 1 tablet by mouth once a day 4)  Methotrexate 2.5 Mg Tabs (Methotrexate sodium) .... Take 6 tabs on thursdays 5)  Lisinopril-hydrochlorothiazide 20-25 Mg Tabs (Lisinopril-hydrochlorothiazide) .... Take 1 tablet by mouth once a day 6)  Calcium 500/d 500-200 Mg-unit Tabs (Calcium carbonate-vitamin d) .... Take 1 tablet  by mouth three times a day 7)  Acyclovir 400 Mg Tabs (Acyclovir) .... Take 1 tablet by mouth two times a day 8)  Multivitamins Tabs (Multiple vitamin) .... Take 1 tablet by mouth once a day  Other Orders: T-Basic Metabolic Panel [phone]) T-Vitamin D (25-Hydroxy) [phone])  Patient Instructions: 1)  Please schedule a follow-up appointment in 3 months. 2)  I am extremely happythat you have done so well with your surgery. 3)  Keep active, and continue to do well and straighten the back 4)  Discontinue duragesic, and continue all other meds please

## 2010-02-04 NOTE — Progress Notes (Signed)
Summary: Office Visit  Office Visit   Imported By: Lind Guest 05/31/2009 08:43:19  _____________________________________________________________________  External Attachment:    Type:   Image     Comment:   External Document

## 2010-02-04 NOTE — Letter (Signed)
Summary: rehabilitation center  rehabilitation center   Imported By: Lind Guest 09/18/2009 08:56:32  _____________________________________________________________________  External Attachment:    Type:   Image     Comment:   External Document

## 2010-02-04 NOTE — Progress Notes (Signed)
Summary: nurse   Phone Note Call from Patient   Summary of Call: would like to talk with nurse about surgery. [phone]  Initial call taken by: Rudene Anda,  May 28, 2009 3:52 PM  Follow-up for Phone Call        wanted you to know that either the patch or the nighttime pain meds are making her vomit. She had her cardio eval today for clearance and they faxed everything over for her surgery, Still hasn't heard when its going to be but she is not going to use the patches anymore.  Follow-up by: Everitt Amber LPN,  May 28, 2009 4:13 PM     Appended Document: nurse  do not take the pill anymore, pt advised of this

## 2010-02-04 NOTE — Letter (Signed)
Summary: PHYSICAL THERAPY REPORT  PHYSICAL THERAPY REPORT   Imported By: Lind Guest 10/22/2009 09:17:41  _____________________________________________________________________  External Attachment:    Type:   Image     Comment:   External Document

## 2010-02-04 NOTE — Letter (Signed)
Summary: TRANSFERRED RECORDS  TRANSFERRED RECORDS   Imported By: Lind Guest 05/29/2009 11:28:11  _____________________________________________________________________  External Attachment:    Type:   Image     Comment:   External Document

## 2010-02-04 NOTE — Letter (Signed)
Summary: MEDICAL RELEASE  MEDICAL RELEASE   Imported By: Lind Guest 05/10/2009 10:28:19  _____________________________________________________________________  External Attachment:    Type:   Image     Comment:   External Document

## 2010-02-06 NOTE — Progress Notes (Signed)
Summary: [facility] / MEDICINE  Phone Note Call from Patient   Summary of Call: [facility] TOLD HER TO CALL us AND SEND IN FOR HER ACYCLOVIR Initial call taken by: Lind Guest,  December 31, 2009 1:45 PM    Prescriptions: ACYCLOVIR 400 MG TABS (ACYCLOVIR) Take 1 tablet by mouth two times a day  #180 x 0   Entered by:   Adella Hare LPN   Authorized by:   [name] MD   Signed by:   Adella Hare LPN on 04/54/0981   Method used:   Faxed to ...       [facility] (mail-order)             , [place]         Ph: [phone]       Fax: [phone]   RxID:   [id number]

## 2010-02-07 NOTE — Progress Notes (Signed)
[facility]   Imported By: Lind Guest 05/30/2008 09:01:42  _____________________________________________________________________  External Attachment:    Type:   Image     Comment:   External Document

## 2010-02-18 ENCOUNTER — Telehealth: Payer: Self-pay | Admitting: Family Medicine

## 2010-02-26 NOTE — Progress Notes (Signed)
Summary: medicines  Phone Note Call from Patient   Summary of Call: needs her   lisinopril,  acylovir, and meloxicam   [facility] [place] [facility] refills Initial call taken by: Lind Guest,  February 18, 2010 4:04 PM    Prescriptions: ACYCLOVIR 400 MG TABS (ACYCLOVIR) Take 1 tablet by mouth two times a day  #180 x 0   Entered by:   Everitt Amber LPN   Authorized by:   [name] MD   Signed by:   Everitt Amber LPN on 54/09/8117   Method used:   [facility]        [facility] [phone][facility]retail[facility]       [address]       [place], [place]  [address]       Ph: [phone]       Fax: [phone]   RxID:   [id number] LISINOPRIL-HYDROCHLOROTHIAZIDE 20-25 MG TABS (LISINOPRIL-HYDROCHLOROTHIAZIDE) Take 1 tablet by mouth once a day  #90 x 0   Entered by:   Everitt Amber LPN   Authorized by:   [name] MD   Signed by:   Everitt Amber LPN on 34/74/2595   Method used:   [facility]        [facility] [phone]* (retail)       [address]       [place], [place]  [address]       Ph: [phone]       Fax: [phone]   RxID:   [id number] MELOXICAM 15 MG TABS (MELOXICAM) Take 1 tablet by mouth once a day  #90 x 0   Entered by:   Everitt Amber LPN   Authorized by:   [name] MD   Signed by:   Everitt Amber LPN on 62/37/6283   Method used:   [facility]ronically to        [facility] [phone]* (retail)       [address]       [place], [place]  [address]       Ph: [phone]       Fax: [phone]   RxID:   [id number]

## 2010-03-03 LAB — CONVERTED CEMR LAB
ALT: 12 units/L (ref 0–35)
Albumin: 4.4 g/dL (ref 3.5–5.2)
Bilirubin, Direct: 0.1 mg/dL (ref 0.0–0.3)
Cholesterol: 143 mg/dL (ref 0–200)
Glucose, Bld: 100 mg/dL — ABNORMAL HIGH (ref 70–99)
HDL: 47 mg/dL (ref >39)
Potassium: 3.9 meq/L (ref 3.5–5.3)
Sodium: 140 meq/L (ref 135–145)
TSH: 1.426 microintl units/mL (ref 0.350–4.500)
Total CHOL/HDL Ratio: 3
Total Protein: 7 g/dL (ref 6.0–8.3)
Triglycerides: 92 mg/dL (ref <150)
VLDL: 18 mg/dL (ref 0–40)
Vit D, 25-Hydroxy: 31 ng/mL (ref 30–89)

## 2010-03-06 ENCOUNTER — Encounter: Payer: Self-pay | Admitting: Family Medicine

## 2010-03-06 ENCOUNTER — Ambulatory Visit (INDEPENDENT_AMBULATORY_CARE_PROVIDER_SITE_OTHER): Payer: Medicare Other | Admitting: Family Medicine

## 2010-03-06 DIAGNOSIS — M069 Rheumatoid arthritis, unspecified: Secondary | ICD-10-CM

## 2010-03-06 DIAGNOSIS — I498 Other specified cardiac arrhythmias: Secondary | ICD-10-CM

## 2010-03-06 DIAGNOSIS — E785 Hyperlipidemia, unspecified: Secondary | ICD-10-CM

## 2010-03-06 DIAGNOSIS — I10 Essential (primary) hypertension: Secondary | ICD-10-CM

## 2010-03-10 ENCOUNTER — Encounter: Payer: Self-pay | Admitting: Family Medicine

## 2010-03-13 ENCOUNTER — Encounter: Payer: Self-pay | Admitting: *Deleted

## 2010-03-18 NOTE — Assessment & Plan Note (Signed)
Summary: follow up   Vital Signs:  Patient profile:   77 year old female Menstrual status:  postmenopausal Height:      60 inches Weight:      134.25 pounds BMI:     26.31 O2 Sat:      98 % Pulse rate:   41 / minute Pulse rhythm:   regular Resp:     16 per minute BP sitting:   110 / 60  Vitals Entered By: Everitt Amber LPN (March 06, 6043 8:59 AM)  Nutrition Counseling: Patient's BMI is greater than 25 and therefore counseled on weight management options. CC: Follow up chronic problems   CC:  Follow up chronic problems.  History of Present Illness: Pt has a 1 month h/o increased exerional fatigue. She denies palpitations, leg swelling, PND or orthopnea. She has intermittent chest discomfort, noo aggravating or relieving factiors noted, no radiaiting, no associated nausea, diaphoresis or leg swelling, PND or orthopnea. She has had no new meds. Reports  that she otherwise has no concernas and is here for routine follopw up Denies recent fever or chills. Denies sinus pressure, nasal congestion , ear pain or sore throat. Denies chest congestion, or cough productive of sputum.  Denies abdominal pain, nausea, vomitting, diarrhea or constipation. Denies change in bowel movements or bloody stool. Denies dysuria , frequency, incontinence or hesitancyDenies  joint pain, swelling, or reduced mobility. Denies headaches, vertigo, seizures. Denies depression, anxiety or insomnia. Denies  rash, lesions, or itch.     Allergies (verified): 1)  ! Aspirin  Review of Systems      See HPI General:  Complains of fatigue and malaise. Eyes:  Denies discharge and eye pain. MS:  Complains of low back pain and mid back pain; chronic and controlled. Endo:  Denies cold intolerance, excessive hunger, excessive thirst, and excessive urination. Heme:  Denies abnormal bruising and bleeding. Allergy:  Denies hives or rash and itching eyes.  Physical Exam  General:  Well-developed,well-nourished,in  no acute distress; alert,appropriate and cooperative throughout examination HEENT: No facial asymmetry,  EOMI, No sinus tenderness,  oropharynx  pink and moist.   Chest: Clear to auscultation bilaterally. No reproducible chest wall tenderness CVS: S1, S2, No murmurs, No S3.   Abd: Soft, Nontender.  MS: Decreased  ROM spine,and normal in  hips, shoulders and knees.  Ext: No edema.   CNS: CN 2-12 intact, power tone and sensation normal throughout.   Skin: Intact, no visible lesions or rashes.  Psych: Good eye contact, normal affect.  Memory intact, not anxious or depressed appearing.    Impression & Recommendations:  Problem # 1:  ABNORMAL ELECTROCARDIOGRAM (ICD-794.31) Assessment Comment Only  Orders: Cardiology Referral (Cardiology) bradycardia , rate is 41, with possible anterior ischemias and 1 month h/o fatigue  Problem # 2:  HYPERLIPIDEMIA (ICD-272.4) Assessment: Unchanged  Her updated medication list for this problem includes:    Crestor 10 Mg Tabs (Rosuvastatin calcium) .Marland Kitchen... Take 1 tablet by mouth once a day  Labs Reviewed: SGOT: 19 (03/03/2010)   SGPT: 12 (03/03/2010)   HDL:47 (03/03/2010), 66 (10/24/2009)  LDL:78 (03/03/2010), 71 (10/24/2009)  Chol:143 (03/03/2010), 157 (10/24/2009)  Trig:92 (03/03/2010), 102 (10/24/2009)  Problem # 3:  BACK PAIN WITH RADICULOPATHY (ICD-729.2) Assessment: Improved  Problem # 4:  UNSPECIFIED ESSENTIAL HYPERTENSION (ICD-401.9) Assessment: Comment Only  The following medications were removed from the medication list:    Cardizem La 240 Mg Xr24h-tab (Diltiazem hcl coated beads) .Marland Kitchen... Take 1 tab by mouth at bedtime Her  updated medication list for this problem includes:    Lisinopril-hydrochlorothiazide 20-25 Mg Tabs (Lisinopril-hydrochlorothiazide) .Marland Kitchen... Take 1 tablet by mouth once a day    Cardizem Cd 120 Mg Xr24h-cap (Diltiazem hcl coated beads) .Marland Kitchen... Take 1 capsule by mouth once a day stop cardizem 240mg  effective 03/06/2010  BP  today: 110/60 Prior BP: 108/60 (10/28/2009)  Labs Reviewed: K+: 3.9 (03/03/2010) Creat: : 1.06 (03/03/2010)   Chol: 143 (03/03/2010)   HDL: 47 (03/03/2010)   LDL: 78 (03/03/2010)   TG: 92 (03/03/2010)  Complete Medication List: 1)  Crestor 10 Mg Tabs (Rosuvastatin calcium) .... Take 1 tablet by mouth once a day 2)  Meloxicam 15 Mg Tabs (Meloxicam) .... Take 1 tablet by mouth once a day 3)  Methotrexate 2.5 Mg Tabs (Methotrexate sodium) .... Take 6 tabs on thursdays 4)  Lisinopril-hydrochlorothiazide 20-25 Mg Tabs (Lisinopril-hydrochlorothiazide) .... Take 1 tablet by mouth once a day 5)  Calcium 500/d 500-200 Mg-unit Tabs (Calcium carbonate-vitamin d) .... Take 1 tablet by mouth three times a day 6)  Acyclovir 400 Mg Tabs (Acyclovir) .... Take 1 tablet by mouth two times a day 7)  Multivitamins Tabs (Multiple vitamin) .... Take 1 tablet by mouth once a day 8)  Cardizem Cd 120 Mg Xr24h-cap (Diltiazem hcl coated beads) .... Take 1 capsule by mouth once a day stop cardizem 240mg  effective 03/06/2010  Other Orders: EKG w/ Interpretation (93000)  Patient Instructions: 1)  cPE  in 4 to 4.5 months, also mD visit in 2.5 weeks 2)  Mamogram is due in May, pls sched in April 3)  your labs are great,  4)  Belated Happy birthday, and congrats on the upcoming wedding. 5)  Keep the card games  going 6)  yOUR heart rate is slow, pLS sTOP cardizem 240 mg today. 7)  sTART new cardizem 120mg  one daily today, you will be referred for cardiology to asses you because of the slow heart rate and fatigue with an abnormal ekg Prescriptions: CARDIZEM CD 120 MG XR24H-CAP (DILTIAZEM HCL COATED BEADS) Take 1 capsule by mouth once a day stop cardizem 240mg  effective 03/06/2010  #30 x 2   Entered and Authorized by:   [name] MD   Signed by:   [name] MD on 03/06/2010   Method used:   Print[facility] ...       [facility]  [address] [phone]* (retail)       [address]       [place], [place]  [address]       Ph: [phone]       Fax: [phone]   RxID:   [id number]    Orders Added: 1)  Est. Patient Level IV [24401] 2)  EKG w/ Interpretation [93000] 3)  Cardiology Referral [Cardiology]

## 2010-03-18 NOTE — Letter (Signed)
Summary: [facility]/C [facility]  D/C [facility]   Imported By: Lind Guest 03/10/2010 14:46:10  _____________________________________________________________________  External Attachment:    Type:   Image     Comment:   External Document

## 2010-03-23 LAB — BASIC METABOLIC PANEL
BUN: 13 mg/dL (ref 6–23)
BUN: 16 mg/dL (ref 6–23)
BUN: 18 mg/dL (ref 6–23)
CO2: 28 mEq/L (ref 19–32)
Calcium: 10.4 mg/dL (ref 8.4–10.5)
Calcium: 9.3 mg/dL (ref 8.4–10.5)
Calcium: 9.5 mg/dL (ref 8.4–10.5)
Chloride: 100 mEq/L (ref 96–112)
Creatinine, Ser: 1.06 mg/dL (ref 0.4–1.2)
Creatinine, Ser: 1.14 mg/dL (ref 0.4–1.2)
GFR calc Af Amer: >60 mL/min (ref >60)
GFR calc non Af Amer: 42 mL/min — ABNORMAL LOW (ref >60)
GFR calc non Af Amer: 51 mL/min — ABNORMAL LOW (ref >60)
GFR calc non Af Amer: 58 mL/min — ABNORMAL LOW (ref >60)
Glucose, Bld: 116 mg/dL — ABNORMAL HIGH (ref 70–99)
Glucose, Bld: 155 mg/dL — ABNORMAL HIGH (ref 70–99)
Sodium: 141 mEq/L (ref 135–145)

## 2010-03-23 LAB — URINE MICROSCOPIC-ADD ON

## 2010-03-23 LAB — URINALYSIS, ROUTINE W REFLEX MICROSCOPIC
Bilirubin Urine: NEGATIVE
Bilirubin Urine: NEGATIVE
Glucose, UA: NEGATIVE mg/dL
Ketones, ur: NEGATIVE mg/dL
Specific Gravity, Urine: 1.008 (ref 1.005–1.030)
Specific Gravity, Urine: 1.008 (ref 1.005–1.030)
Urobilinogen, UA: 0.2 mg/dL (ref 0.0–1.0)
pH: 6.5 (ref 5.0–8.0)
pH: 7 (ref 5.0–8.0)
pH: 7.5 (ref 5.0–8.0)

## 2010-03-23 LAB — GLUCOSE, CAPILLARY
Glucose-Capillary: 100 mg/dL — ABNORMAL HIGH (ref 70–99)
Glucose-Capillary: 102 mg/dL — ABNORMAL HIGH (ref 70–99)
Glucose-Capillary: 113 mg/dL — ABNORMAL HIGH (ref 70–99)
Glucose-Capillary: 166 mg/dL — ABNORMAL HIGH (ref 70–99)
Glucose-Capillary: 180 mg/dL — ABNORMAL HIGH (ref 70–99)
Glucose-Capillary: 84 mg/dL (ref 70–99)
Glucose-Capillary: 90 mg/dL (ref 70–99)
Glucose-Capillary: 94 mg/dL (ref 70–99)

## 2010-03-23 LAB — CBC
HCT: 25.9 % — ABNORMAL LOW (ref 36.0–46.0)
Hemoglobin: 8.7 g/dL — ABNORMAL LOW (ref 12.0–15.0)
MCH: 31.3 pg (ref 26.0–34.0)
MCHC: 32.4 g/dL (ref 30.0–36.0)
MCHC: 33 g/dL (ref 30.0–36.0)
MCV: 96.3 fL (ref 78.0–100.0)
Platelets: 182 10*3/uL (ref 150–400)
RBC: 2.77 MIL/uL — ABNORMAL LOW (ref 3.87–5.11)
RDW: 15.1 % (ref 11.5–15.5)
RDW: 15.5 % (ref 11.5–15.5)
WBC: 10.5 10*3/uL (ref 4.0–10.5)

## 2010-03-23 LAB — HEMOGLOBIN A1C: Hgb A1c MFr Bld: 6.5 % — ABNORMAL HIGH (ref <5.7)

## 2010-03-23 LAB — URINE CULTURE: Special Requests: NEGATIVE

## 2010-03-24 LAB — APTT: aPTT: 27 seconds (ref 24–37)

## 2010-03-24 LAB — URINALYSIS, ROUTINE W REFLEX MICROSCOPIC
Glucose, UA: NEGATIVE mg/dL
Nitrite: NEGATIVE
pH: 6.5 (ref 5.0–8.0)

## 2010-03-24 LAB — COMPREHENSIVE METABOLIC PANEL
AST: 23 U/L (ref 0–37)
BUN: 22 mg/dL (ref 6–23)
CO2: 30 mEq/L (ref 19–32)
Calcium: 10.6 mg/dL — ABNORMAL HIGH (ref 8.4–10.5)
Creatinine, Ser: 1.29 mg/dL — ABNORMAL HIGH (ref 0.4–1.2)
GFR calc Af Amer: 49 mL/min — ABNORMAL LOW (ref >60)
GFR calc non Af Amer: 40 mL/min — ABNORMAL LOW (ref >60)

## 2010-03-24 LAB — CBC
HCT: 34.3 % — ABNORMAL LOW (ref 36.0–46.0)
MCHC: 34 g/dL (ref 30.0–36.0)
MCV: 95.7 fL (ref 78.0–100.0)
RBC: 3.59 MIL/uL — ABNORMAL LOW (ref 3.87–5.11)

## 2010-03-24 LAB — SURGICAL PCR SCREEN: Staphylococcus aureus: NEGATIVE

## 2010-03-24 LAB — PROTIME-INR
INR: 1.1 (ref 0.00–1.49)
Prothrombin Time: 14.1 seconds (ref 11.6–15.2)

## 2010-03-24 LAB — URINE MICROSCOPIC-ADD ON

## 2010-03-31 ENCOUNTER — Other Ambulatory Visit: Payer: Self-pay | Admitting: Family Medicine

## 2010-04-01 MED ORDER — DILTIAZEM HCL ER COATED BEADS 120 MG PO CP24: 120.0000 mg | ORAL_CAPSULE | Freq: Every day | ORAL | Status: DC

## 2010-04-07 ENCOUNTER — Encounter: Payer: Self-pay | Admitting: Cardiology

## 2010-04-07 ENCOUNTER — Ambulatory Visit: Payer: Medicare Other | Admitting: Cardiology

## 2010-04-08 ENCOUNTER — Encounter: Payer: Self-pay | Admitting: Cardiology

## 2010-04-08 ENCOUNTER — Ambulatory Visit (INDEPENDENT_AMBULATORY_CARE_PROVIDER_SITE_OTHER): Payer: Medicare Other | Admitting: Cardiology

## 2010-04-08 VITALS — BP 147/68 | HR 62 | Ht 61.0 in | Wt 138.0 lb

## 2010-04-08 DIAGNOSIS — I498 Other specified cardiac arrhythmias: Secondary | ICD-10-CM

## 2010-04-08 DIAGNOSIS — I10 Essential (primary) hypertension: Secondary | ICD-10-CM

## 2010-04-08 DIAGNOSIS — R5383 Other fatigue: Secondary | ICD-10-CM

## 2010-04-08 DIAGNOSIS — I495 Sick sinus syndrome: Secondary | ICD-10-CM

## 2010-04-08 DIAGNOSIS — E785 Hyperlipidemia, unspecified: Secondary | ICD-10-CM

## 2010-04-08 DIAGNOSIS — R5381 Other malaise: Secondary | ICD-10-CM

## 2010-04-08 DIAGNOSIS — R9431 Abnormal electrocardiogram [ECG] [EKG]: Secondary | ICD-10-CM

## 2010-04-08 NOTE — Assessment & Plan Note (Signed)
Possibly related to symptomatic bradycardia, and therefore agree with reduction in Cardizem CD dosing. She does report some improvement after the recent dose change, and one could consider discontinuing the medicine altogether, perhaps choosing Norvasc for blood pressure control without adverse reduction in heart rate.

## 2010-04-08 NOTE — Assessment & Plan Note (Signed)
Blood pressure elevated today. If upward trend remains a problem with reduced dose Cardizem CD, it might be worth considering discontinuing Cardizem altogether, and using Norvasc which should not adversely affect heart rate. Other medical therapy looks reasonable.

## 2010-04-08 NOTE — Assessment & Plan Note (Signed)
Could be contributing to fatigue. Agree with reduction in Cardizem CD dosing.

## 2010-04-08 NOTE — Assessment & Plan Note (Addendum)
Overall nonspecific, intervals are normal, poor R wave progression is noted with nonspecific ST-T changes. In light of her fatigue, a 2-D echocardiogram will also be obtained to exclude any significant cardiac structural abnormalities or LV dysfunction.

## 2010-04-08 NOTE — Progress Notes (Signed)
Clinical Summary [name] is a 77 y.o.female referred for cardiology consultation by Dr. Lodema Hong. She presents with a gradual, several month history of exertional fatigue. She has been noted to be bradycardic in the past, and when she saw Dr. Lodema Hong most recently, Cardizem CD was reduced from 240 mg to 120 mg daily. [name] has not experienced any definite dizziness or syncope, no exertional chest pain. She has NYHA class 2-3 dyspnea exertion at most. She feels that with this reduction in Cardizem dosing, her symptoms are improving.  ECG is reviewed below, showing sinus bradycardia in the 50s, decreased anterior R-wave progression with nonspecific ST-T wave changes.  She otherwise reports no orthopnea, PND, lower extremity edema. She has had chronic problems over the years with arthritic pain.   Allergies  Allergen Reactions  . Aspirin     REACTION: burns stomach    Current outpatient prescriptions:acyclovir (ZOVIRAX) 400 MG tablet, Take 400 mg by mouth 2 (two) times daily. Take 1 tablet by mouth two times a day , Disp: , Rfl: ;  calcium-vitamin D (CALCIUM 500+D) 500-200 MG-UNIT per tablet, Take 1 tablet by mouth 3 (three) times daily. Take one tablet by mouth three times a day , Disp: , Rfl: ;  diltiazem (CARDIZEM CD) 120 MG 24 hr capsule, Take 1 capsule (120 mg total) by mouth daily., Disp: 30 capsule, Rfl: 3 lisinopril-hydrochlorothiazide (PRINZIDE,ZESTORETIC) 20-25 MG per tablet, Take 1 tablet by mouth daily. Take one tablet by mouth once a day , Disp: , Rfl: ;  meloxicam (MOBIC) 15 MG tablet, Take 15 mg by mouth daily. Take 1 tablet by mouth once a day , Disp: , Rfl: ;  methotrexate 2.5 MG tablet, Take 2.5 mg by mouth as directed. Take 6 tablets by mouth on Thursdays , Disp: , Rfl:  Multiple Vitamin (MULTIVITAMIN) capsule, Take 1 capsule by mouth daily. Take 1 tablet by mouth once a day , Disp: , Rfl: ;  rosuvastatin (CRESTOR) 10 MG tablet, Take 10 mg by mouth daily. Take one tablet by mouth  once a day , Disp: , Rfl:   Past Medical History  Diagnosis Date  . Essential hypertension, benign   . Mixed hyperlipidemia   . Rheumatoid arthritis   . Osteoarthritis   . Bradycardia     Sinus - noted 6/11    Past Surgical History  Procedure Date  . Bilateral cataract surgery 2009  . L2-l3 discectomy 2002  . Right shoulder cyst excision 2010  . L3-l4, l4-l5 lumbar decompression 2011    Family History  Problem Relation Age of Onset  . Stroke Mother   . Cancer Father   . Diabetes Sister   . Diabetes Sister   . Kidney failure Brother   . Kidney failure Brother     Social History [name] reports that she has never smoked. She has never used smokeless tobacco. [name] reports that she does not drink alcohol.  Review of Systems No fevers, chills, or unusual weight change. No chest pain, progressive shortness of breath, cough, hemoptysis, or wheezing. No palpitations, dizziness, or syncope. No dysphasia or odynophagia. Stable appetite with no abdominal pain, melena, or hematochezia. No orthopnea, PND, or lower extremity edema. No focal motor weakness, memory problems, or speech deficits. Otherwise systems reviewed and negative except as already outlined.   Physical Examination Filed Vitals:   04/08/10 1339  BP: 147/68  Pulse: 62  Normally nourished appearing elderly woman, looking somewhat younger than stated age, in no acute distress. HEENT:  Conjunctiva and lids are normal, oropharynx with moist mucosa. Neck: Supple, no elevated JVP or carotid bruits, without megaly. Lungs: Clear to auscultation, nontender. Cardiac: Regular rate and rhythm, no S3 or gallop, soft systolic murmur at the base. Abdomen: Soft, nontender, bowel sounds present. Skin: Warm and dry. Musculoseletal: Arthritic deformities noted, evidence of rheumatoid arthritis. Extremities: No pitting edema, distal pulses full. Neuropsychiatric: Alert and oriented x3, affect appropriate   ECG Sinus  bradycardia at 53 beats per minute, PR interval 170 ms, QRS 86 ms, diminished anterior R-wave progression, nonspecific ST-T changes.   Problem List and Plan

## 2010-04-08 NOTE — Patient Instructions (Signed)
Your physician recommends that you schedule a follow-up appointment in: as needed Your physician has requested that you have an echocardiogram. Echocardiography is a painless test that uses sound waves to create images of your heart. It provides your doctor with information about the size and shape of your heart and how well your heart's chambers and valves are working. This procedure takes approximately one hour. There are no restrictions for this procedure. Your physician recommends that you continue on your current medications as directed. Please refer to the Current Medication list given to you today.

## 2010-04-08 NOTE — Assessment & Plan Note (Signed)
Followed by Dr. Lodema Hong, on Crestor.

## 2010-04-12 LAB — DIFFERENTIAL
Basophils Absolute: 0 10*3/uL (ref 0.0–0.1)
Basophils Relative: 1 % (ref 0–1)
Lymphocytes Relative: 31 % (ref 12–46)
Neutro Abs: 2.6 10*3/uL (ref 1.7–7.7)
Neutrophils Relative %: 58 % (ref 43–77)

## 2010-04-12 LAB — BASIC METABOLIC PANEL
BUN: 30 mg/dL — ABNORMAL HIGH (ref 6–23)
CO2: 28 mEq/L (ref 19–32)
Calcium: 10.1 mg/dL (ref 8.4–10.5)
Creatinine, Ser: 1.47 mg/dL — ABNORMAL HIGH (ref 0.4–1.2)
GFR calc non Af Amer: 35 mL/min — ABNORMAL LOW (ref >60)
Glucose, Bld: 85 mg/dL (ref 70–99)
Sodium: 140 mEq/L (ref 135–145)

## 2010-04-12 LAB — CBC
MCHC: 34.2 g/dL (ref 30.0–36.0)
Platelets: 231 10*3/uL (ref 150–400)
RDW: 15.1 % (ref 11.5–15.5)

## 2010-04-14 ENCOUNTER — Ambulatory Visit (HOSPITAL_COMMUNITY)
Admission: RE | Admit: 2010-04-14 | Discharge: 2010-04-14 | Disposition: A | Discharge status: Home / Self Care | Payer: Medicare Other | Source: Ambulatory Visit | Attending: Cardiology | Admitting: Cardiology

## 2010-04-14 DIAGNOSIS — I10 Essential (primary) hypertension: Secondary | ICD-10-CM | POA: Insufficient documentation

## 2010-04-14 DIAGNOSIS — I4891 Unspecified atrial fibrillation: Secondary | ICD-10-CM | POA: Insufficient documentation

## 2010-04-17 ENCOUNTER — Encounter: Payer: Self-pay | Admitting: Cardiology

## 2010-04-28 ENCOUNTER — Encounter: Payer: Self-pay | Admitting: Family Medicine

## 2010-04-29 ENCOUNTER — Encounter: Payer: Self-pay | Admitting: Family Medicine

## 2010-04-29 ENCOUNTER — Ambulatory Visit (INDEPENDENT_AMBULATORY_CARE_PROVIDER_SITE_OTHER): Payer: Medicare Other | Admitting: Family Medicine

## 2010-04-29 ENCOUNTER — Other Ambulatory Visit: Payer: Self-pay | Admitting: Family Medicine

## 2010-04-29 VITALS — BP 110/60 | HR 69 | Resp 16 | Ht 59.5 in | Wt 139.0 lb

## 2010-04-29 DIAGNOSIS — I10 Essential (primary) hypertension: Secondary | ICD-10-CM

## 2010-04-29 DIAGNOSIS — Z139 Encounter for screening, unspecified: Secondary | ICD-10-CM

## 2010-04-29 DIAGNOSIS — E785 Hyperlipidemia, unspecified: Secondary | ICD-10-CM

## 2010-04-29 DIAGNOSIS — Z23 Encounter for immunization: Secondary | ICD-10-CM

## 2010-04-29 DIAGNOSIS — M069 Rheumatoid arthritis, unspecified: Secondary | ICD-10-CM

## 2010-04-29 MED ORDER — ROSUVASTATIN CALCIUM 10 MG PO TABS: 10.0000 mg | ORAL_TABLET | Freq: Every day | ORAL | Status: DC

## 2010-04-29 MED ORDER — LISINOPRIL-HYDROCHLOROTHIAZIDE 20-25 MG PO TABS: 1.0000 | ORAL_TABLET | Freq: Every day | ORAL | Status: DC

## 2010-04-29 MED ORDER — METHOTREXATE SODIUM 2.5 MG PO TABS: 2.5000 mg | ORAL_TABLET | ORAL | Status: DC

## 2010-04-29 MED ORDER — MELOXICAM 15 MG PO TABS: 15.0000 mg | ORAL_TABLET | Freq: Every day | ORAL | Status: DC

## 2010-04-29 NOTE — Patient Instructions (Signed)
F/u in 4 months.  I am happy that you are doing better.  Continue current medication.

## 2010-04-30 NOTE — Progress Notes (Signed)
  Subjective:    Patient ID: Stacy Meyers, female    DOB: [birth year], 77 y.o.   MRN: [id number]  HPI The PT is here for follow up and re-evaluation of chronic medical conditions, medication management and review of recent lab and radiology data.  Preventive health is updated, specifically  Cancer screening, and Immunization.   Questions or concerns regarding consultations or procedures which the PT has had in the interim are  Addressed. She has been evaluated by cardiology  Since last seen The PT denies any adverse reactions to current medications since the last visit.  There are no new concerns.  There are no specific complaints       Review of Systems Denies recent fever or chills. Denies sinus pressure, nasal congestion, ear pain or sore throat. Denies chest congestion, productive cough or wheezing. Denies chest pains, palpitations, paroxysmal nocturnal dyspnea, orthopnea and leg swelling Denies abdominal pain, nausea, vomiting,diarrhea or constipation.  Denies rectal bleeding or change in bowel movement. Denies dysuria, frequency, hesitancy or incontinence. Denies joint pain, swelling and limitation in mobility. Denies headaches, seizure, numbness, or tingling. Denies depression, anxiety or insomnia. Denies skin break down or rash.        Objective:   Physical Exam    Patient alert and oriented and in no Cardiopulmonary distress.  HEENT: No facial asymmetry, EOMI, no sinus tenderness, TM's clear, Oropharynx pink and moist.  Neck supple no adenopathy.  Chest: Clear to auscultation bilaterally.  CVS: S1, S2 no murmurs, no S3.  ABD: Soft non tender. Bowel sounds normal.  Ext: No edema  [id number]  ROM spine,adequate in shoulders, hips and knees.  Skin: Intact, no ulcerations or rash noted.  Psych: Good eye contact, normal affect. Memory intact not anxious or depressed appearing.  CNS: CN 2-12 intact, power, tone and sensation normal  throughout.     Assessment & Plan:  1.Hypertension:Controlled, no changes in medication.  2. Bradycardia: resolved with change in med 3. Osteoarthritis : unchanged 4. HSV2: controlled on med

## 2010-05-20 NOTE — Op Note (Signed)
Stacy Meyers, Stacy Meyers                 ACCOUNT NO.:  [id number]   MEDICAL RECORD NO.:  [id number]          PATIENT TYPE:  AMB   LOCATION:  [facility]                          FACILITY:  [facility]   PHYSICIAN:  Thomas A. Cornett, M.D.DATE OF BIRTH:  [birth year]   DATE OF PROCEDURE:  08/30/2008  DATE OF DISCHARGE:                               OPERATIVE REPORT   PREOPERATIVE DIAGNOSIS:  Right shoulder cyst measuring 3 x 4 cm.   POSTOPERATIVE DIAGNOSIS:  Subcutaneous right shoulder cyst measuring 3.4  cm.   PROCEDURE:  Excision of right shoulder cyst measuring 3 x 40 cm,  subcutaneous.   SURGEON:  Maisie Fus A. Cornett, MD   ANESTHESIA:  MAC with 0.25% Sensorcaine.   SPECIMEN:  Right shoulder cyst to pathology.   INDICATIONS FOR PROCEDURE:  The patient is a 77 year old female with a  right shoulder cyst.  It presented about a year ago and Dr. [name] aspirated this, but has returned causing more discomfort and she  wished to have it removed.  She presents today for the above.  This was  felt to be a possible synovial cyst.   DESCRIPTION OF PROCEDURE:  The patient was brought to the operating room  and placed supine.  After induction of general anesthesia, the right  shoulder region was prepped and draped in a sterile fashion.  Sensorcaine 0.25% with epinephrine was injected over the cyst overlying  the right shoulder overlying the deltoid muscle.  Incision was made and  the entire cyst capsule was identified.  We then excised the cyst off  the muscle.  The fluid inside was a viscous clear fluid consistent with  a synovial cyst.  The entire cyst was removed.  I saw no communication  with the joint cavity.  Once this was removed, I saw no evidence of any  leakage of any joint fluid.  We then closed the wound after irrigating  in layers using a 3-0 Vicryl deep layer to secure the skin back down to  the muscle and 4-0 Monocryl was used to close the skin in a subcuticular  fashion.  Dermabond  was applied.  A compression dressing was were  applied on the top of this.  All final counts of sponge, needle, and  instrument were found to be correct in this portion of the case.  The  patient was awoke and taken to recovery in satisfactory condition.      Thomas A. Cornett, M.D.  Electronically Signed     TAC/MEDQ  D:  08/30/2008  T:  08/31/2008  Job:  [id number]   cc:   Sheliah Mends, MD

## 2010-05-23 NOTE — Op Note (Signed)
[facility]  Patient:    Stacy Meyers, Stacy Meyers                        MRN: [id number] Proc. Date: 07/16/00 Adm. Date:  [id number] Attending:  Coletta Memos                           Operative Report  PREOPERATIVE DIAGNOSES: 1. Bilateral disk herniation at L2-3 on the left side. 2. Left L2 radiculopathy.  POSTOPERATIVE DIAGNOSES: 1. Bilateral disk herniation at L2-3 on the left side. 2. Left L2 radiculopathy.  OPERATION:  Far lateral diskectomy with microdissection L2-3, left side.  COMPLICATIONS:  None.  SURGEON:  Kyle L. Franky Macho, M.D.  ASSISTANT:  Cristi Loron, M.D.  INDICATIONS:  Ms. Baxendale is a 77 year old woman who has had severe pain radiating into the groin and anterior thigh.  On MRI, she has a large lateral disk herniation at L2-3 on the left side.  She has obvious compression of the left L2 nerve root.  DESCRIPTION OF PROCEDURE:   Ms. Lombardozzi was brought to the operating room, intubated, placed under general anesthesia without difficulty.  She was placed prone on the Wilson frame, and all pressure points were properly padded.  Her back was prepped, and she was draped in a sterile fashion.  I took a localizing x-ray, and it shows the needle to be pointing at the spinous process of L2.  Using that as a guide, I infiltrated 0.5% lidocaine 1:200,000 epinephrine, approximately 14 cc into the paraspinous musculature underneath the skin in the midline.  I then made my skin incision with a #10 blade, took this down to the thoracolumbar fascia.  I exposed the lamina and retracted out to the level of the facet.  I placed the double interganglionic knife inferior to the lamina of L2.  Inferior to the lamina I had exposed, and this was L2 on x-ray.  I then used that and further exposed the lamina.  I then exposed the pars interarticularis of L2.   After doing that, I brought the microscope into the operative field and then used the high-speed air  drill to drill out a small portion of the pars.  I got underneath the bone and removed the ligamentum flavum and exposed the nerve root.  The L2 pedicle was then clearly identified as was the nerve root, and there was a large hump of disk which was caudal to the nerve root, pressing it up against the pedicle.  I opened it with a #15 blade and then removed disk material in a progressive fashion. Dr. Lovell Sheehan helped with removal of the disk material.  I then inspected underneath the L2 nerve root and felt that there was no more compression.  I then irrigated.  I then placed Depo-Medrol 2 cc.  I then closed the wound in layered fashion with Vicryl sutures.  I took the microscope out of the field and reapproximated the skin edges with Vicryl.  Dermabond was used for a sterile dressing.  The patient was then rolled supine and extubated, moving all extremities well. DD:  07/16/00 TD:  07/16/00 Job: [id number]

## 2010-05-23 NOTE — H&P (Signed)
[place]. [facility]  Patient:    Stacy Meyers, Stacy Meyers                          MRN: [id number] O Adm. Date:  07/16/00 Attending:  Ronaldo Miyamoto L. Franky Macho, M.D.                         History and Physical  ADMITTING DIAGNOSES: 1. Displaced disk, left L2-3. 2. Left L2 radiculopathy.  INDICATIONS:  Stacy Meyers is a 77 year old retired woman who has had severe pain in the left lower extremity for approximately the last month.  It has gotten worse over the last two weeks.  She took a trip to the [facility] Emergency Room, had a CT scan done and was told she had both gallstones and kidney stones.  She was then sent to see Dr. Geraldo Pitter and then Dr. Lindaann Slough for the kidney stones.  It was his impression that the pain which radiated into the left groin and thigh anteriorly was not consistent with nephrolithiasis, so he ordered an MRI scan of the lumbar spine which showed a large disk at L2-3, extra- and intraforaminally.  Stacy Meyers has not had any weakness in the lower extremities, but she has had pain which is constant and made worse by standing or walking.  She does have some tingling in the left thigh.  She has had no bowel or bladder dysfunction.  She is mildly constipated since starting to take pain medications.  FAMILY HISTORY:  Her mother died at age 86 with cerebrovascular accident. Father died at age 68 with cancer.  PAST SURGICAL HISTORY:  She has had no previous operations.  ALLERGIES:  She has an allergy to ASPIRIN.  SOCIAL HISTORY:  She does not smoke, drink or use illicit drugs.  She has lost 15 to 20 pounds in the past three months.  She is 5-feet tall and weighs 138 pounds.  REVIEW OF SYSTEMS:  Positive for weight loss, eye glasses, eye infections, balance problems, sinus headaches, sore throat, chest pain, hypertension, hypercholesterolemia, swelling in her feet, leg pain with walking, shortness of breath, indigestion, nausea, vomiting,  abdominal pain, UTIs, painful urination, blood in her urine, nephrolithiasis, back pain, arthritis, increased appetite and excessive urination and thirst.  She denies allergic, hematologic, psychiatric, neurologic and skin problems.  PHYSICAL EXAMINATION:  VITAL SIGNS:  She has a pulse of 64.  NEUROLOGIC:  She is alert and oriented x 4 and answers all questions appropriate.  Memory, language, attention span and fund of knowledge are normal.  Cranial nerve exam is as follows:  Pupils are equal, round and reactive to light with full extraocular movements and normal visual fields. She has a normal funduscopic exam.  Hearing is intact to finger rub bilaterally.  She has symmetric facial sensation and movement.  Uvula elevates in the midline.  Shoulder shrug normal.  Tongue protrudes in the midline.  She has 5/5 strength in the upper and lower extremities.  She can take a 14-inch step without difficulty.  She can toe walk and heel walk and do deep knee bends.  She has 2+ reflexes at the knees and ankles with no clonus.  The toes are downgoing to plantar stimulation.  Pinprick and proprioception are normal.   NECK:  She has no cervical masses or bruits.  LUNGS:  Lung fields are clear.  HEART:  Regular rhythm and rate.  There are  no murmurs or rubs.  Pulses good at the wrists and feet bilaterally.  IMAGING STUDY:  MRI shows a displaced disk at L2-3 on the left side which is intra- and extraforaminal, compromising the left L2 nerve root.  No thecal sac involvement or L3 nerve root involvement.  ASSESSMENT AND PLAN:  Stacy Meyers is a 77 year old woman with an acute left L2 radiculopathy.  This is in agreement with her pain and the disk on MRI. Due to the size of the disk, I do not think conservative treatment would be beneficial and I have recommended and she has agreed to undergo operative therapy.  Risks of the procedure including bleeding, infection, no pain relief, bowel or bladder  dysfunction, need for further surgery and recurrent disk herniation were explained.  She understands and wishes to proceed. DD:  07/16/00 TD:  07/16/00 Job: [id number]

## 2010-05-23 NOTE — Op Note (Signed)
NAME:  Stacy Meyers, Stacy Meyers                           ACCOUNT NO.:  [id number]   MEDICAL RECORD NO.:  [id number]                   PATIENT TYPE:  AMB   LOCATION:  ENDO                                 FACILITY:  [facility]   PHYSICIAN:  Jordan Hawks. Elnoria Howard, MD                 DATE OF BIRTH:  July 10, 1932   DATE OF PROCEDURE:  09/06/2003  DATE OF DISCHARGE:                                 OPERATIVE REPORT   PROCEDURE:  Colonoscopy.   SURGEON:  Jordan Hawks. Elnoria Howard, M.D.   INDICATIONS FOR PROCEDURE:  Average risk colorectal cancer screening.   INSTRUMENT USED:  Olympus adult colonoscope.   PHYSICAL EXAMINATION:  GENERAL APPEARANCE:  The patient is in no acute  distress, alert and oriented.  VITAL SIGNS:  Stable.  LUNGS:  Clear to auscultation bilaterally.  CARDIOVASCULAR:  Regular rate and rhythm without murmurs, rubs, or gallops.  ABDOMEN:  Obese, soft, nontender and nondistended with positive bowel  sounds.   CONSENT:  Informed consent was obtained, explaining the risks of bleeding,  infection, perforation, risk of medication reactions, 10% missed rate for  small colon cancer or polyp and the risk of death.   DESCRIPTION OF PROCEDURE:  After adequate sedation was achieved with Versed  6.5 mg IV and Demerol 75 mg IV, the colonoscope was inserted in the anus and  advanced to the cecum under direct visualization in the left lateral  decubitus position.  The patient was noted to have a redundant and floppy  colon, particularly in the sigmoid and descending colon region.  The patient  was noted to have a good prep and multiple large diverticula were seen in  the ascending colon.  No masses, polyps, inflammation, ulcerations or  erosions were noted throughout the colon.  The patient was documented to  have external hemorrhoids on retroflexion. The patient tolerated the  procedure well.  No complications were encountered.   RECOMMENDATIONS:  1.  The patient should maintain a high fiber diet.  2.  Repeat  colonoscopy in 10 years.                                               Jordan Hawks Elnoria Howard, MD    PDH/MEDQ  D:  09/06/2003  T:  09/06/2003  Job:  [id number]

## 2010-05-27 ENCOUNTER — Ambulatory Visit (HOSPITAL_COMMUNITY)
Admission: RE | Admit: 2010-05-27 | Discharge: 2010-05-27 | Disposition: A | Discharge status: Home / Self Care | Payer: Medicare Other | Source: Ambulatory Visit | Attending: Family Medicine | Admitting: Family Medicine

## 2010-05-27 DIAGNOSIS — Z1231 Encounter for screening mammogram for malignant neoplasm of breast: Secondary | ICD-10-CM | POA: Insufficient documentation

## 2010-05-27 DIAGNOSIS — Z139 Encounter for screening, unspecified: Secondary | ICD-10-CM

## 2010-05-28 ENCOUNTER — Other Ambulatory Visit: Payer: Self-pay | Admitting: Family Medicine

## 2010-06-11 ENCOUNTER — Other Ambulatory Visit: Payer: Self-pay | Admitting: Family Medicine

## 2010-06-11 MED ORDER — ACYCLOVIR 400 MG PO TABS: 400.0000 mg | ORAL_TABLET | Freq: Two times a day (BID) | ORAL | Status: DC

## 2010-06-11 NOTE — Telephone Encounter (Signed)
Sent as requested.

## 2010-06-13 ENCOUNTER — Other Ambulatory Visit: Payer: Self-pay | Admitting: Family Medicine

## 2010-06-15 ENCOUNTER — Other Ambulatory Visit: Payer: Self-pay | Admitting: Family Medicine

## 2010-07-14 ENCOUNTER — Telehealth: Payer: Self-pay | Admitting: Family Medicine

## 2010-07-14 DIAGNOSIS — M069 Rheumatoid arthritis, unspecified: Secondary | ICD-10-CM

## 2010-07-14 MED ORDER — ACYCLOVIR 400 MG PO TABS: 400.0000 mg | ORAL_TABLET | Freq: Two times a day (BID) | ORAL | Status: DC

## 2010-07-14 MED ORDER — DILTIAZEM HCL ER COATED BEADS 120 MG PO CP24: 120.0000 mg | ORAL_CAPSULE | Freq: Every day | ORAL | Status: DC

## 2010-07-14 MED ORDER — METHOTREXATE SODIUM 2.5 MG PO TABS: 2.5000 mg | ORAL_TABLET | ORAL | Status: DC

## 2010-07-14 NOTE — Telephone Encounter (Signed)
meds sent as requested 

## 2010-08-22 ENCOUNTER — Other Ambulatory Visit: Payer: Self-pay | Admitting: Family Medicine

## 2010-08-28 ENCOUNTER — Ambulatory Visit (INDEPENDENT_AMBULATORY_CARE_PROVIDER_SITE_OTHER): Payer: Medicare Other | Admitting: Family Medicine

## 2010-08-28 ENCOUNTER — Encounter: Payer: Self-pay | Admitting: Family Medicine

## 2010-08-28 VITALS — BP 124/78 | HR 61 | Temp 98.0°F | Resp 16 | Ht 59.5 in | Wt 137.1 lb

## 2010-08-28 DIAGNOSIS — R7301 Impaired fasting glucose: Secondary | ICD-10-CM

## 2010-08-28 DIAGNOSIS — IMO0002 Reserved for concepts with insufficient information to code with codable children: Secondary | ICD-10-CM

## 2010-08-28 DIAGNOSIS — J302 Other seasonal allergic rhinitis: Secondary | ICD-10-CM

## 2010-08-28 DIAGNOSIS — M549 Dorsalgia, unspecified: Secondary | ICD-10-CM

## 2010-08-28 DIAGNOSIS — I10 Essential (primary) hypertension: Secondary | ICD-10-CM

## 2010-08-28 DIAGNOSIS — J309 Allergic rhinitis, unspecified: Secondary | ICD-10-CM

## 2010-08-28 DIAGNOSIS — M069 Rheumatoid arthritis, unspecified: Secondary | ICD-10-CM

## 2010-08-28 DIAGNOSIS — E785 Hyperlipidemia, unspecified: Secondary | ICD-10-CM

## 2010-08-28 MED ORDER — METHYLPREDNISOLONE ACETATE 80 MG/ML IJ SUSP
80.0000 mg | Freq: Once | INTRAMUSCULAR | Status: AC
  Administered 2010-08-28: 80 mg via INTRAMUSCULAR

## 2010-08-28 MED ORDER — KETOROLAC TROMETHAMINE 30 MG/ML IJ SOLN
60.0000 mg | Freq: Once | INTRAMUSCULAR | Status: AC
  Administered 2010-08-28: 60 mg via INTRAMUSCULAR

## 2010-08-28 MED ORDER — PREDNISONE (PAK) 5 MG PO TABS: 5.0000 mg | ORAL_TABLET | ORAL | Status: DC

## 2010-08-28 MED ORDER — FLUTICASONE PROPIONATE 50 MCG/ACT NA SUSP: 1.0000 | Freq: Every day | NASAL | Status: DC

## 2010-08-28 MED ORDER — ACYCLOVIR 400 MG PO TABS: 400.0000 mg | ORAL_TABLET | Freq: Two times a day (BID) | ORAL | Status: DC

## 2010-08-28 MED ORDER — DILTIAZEM HCL ER COATED BEADS 120 MG PO CP24: 120.0000 mg | ORAL_CAPSULE | Freq: Every day | ORAL | Status: DC

## 2010-08-28 NOTE — Patient Instructions (Signed)
CPE   Early January.  Fasting labs before next visit.  You have uncontrolled allergies and arthritis flare , injections and med sent in.  Call if you get fever, or chills in the next week with green sputum.

## 2010-08-31 NOTE — Assessment & Plan Note (Signed)
Uncontrolled , medication prescribed 

## 2010-08-31 NOTE — Assessment & Plan Note (Signed)
Controlled, no change in medication  

## 2010-08-31 NOTE — Assessment & Plan Note (Signed)
Deteriorated, anti inflammatory injection administered

## 2010-08-31 NOTE — Progress Notes (Signed)
  Subjective:    Patient ID: Stacy Meyers, female    DOB: Jun 29, 1932, 77 y.o.   MRN: [id number]  HPI The PT is here for follow up and re-evaluation of chronic medical conditions, medication management and review of any available recent lab and radiology data.  Preventive health is updated, specifically  Cancer screening and Immunization.   Questions or concerns regarding consultations or procedures which the PT has had in the interim are  addressed. The PT denies any adverse reactions to current medications since the last visit.  3 day h/o excessive nasal congestion with clear drainage, excessive sneezing and cough.She denies any fever or chills or green sputum 1 week  H/o increased back pain radiating down right leg, no associited trauma     Review of Systems See HPI Denies recent fever or chills. C/o sinus pressure  Denies chest congestion, productive cough or wheezing. Denies chest pains, palpitations and leg swelling Denies abdominal pain, nausea, vomiting,diarrhea or constipation.   Denies dysuria, frequency, hesitancy or incontinence. Denies headaches, seizures, numbness, or tingling. Denies depression, anxiety or insomnia. Denies skin break down or rash.        Objective:   Physical Exam Patient alert and oriented and in no cardiopulmonary distress.  HEENT: No facial asymmetry, EOMI, no sinus tenderness,  oropharynx pink and moist.  Neck supple no adenopathy.Nasal mucosa erythematous and edematous  Chest: Clear to auscultation bilaterally.  CVS: S1, S2 no murmurs, no S3.  ABD: Soft non tender. Bowel sounds normal.  Ext: No edema  MS: decreased  ROM spine,adequate in  shoulders, hips and knees.  Skin: Intact, no ulcerations or rash noted.  Psych: Good eye contact, normal affect. Memory intact not anxious or depressed appearing.  CNS: CN 2-12 intact, power, tone and sensation normal throughout.        Assessment & Plan:

## 2010-09-17 ENCOUNTER — Other Ambulatory Visit: Payer: Self-pay | Admitting: Family Medicine

## 2010-09-17 ENCOUNTER — Ambulatory Visit (INDEPENDENT_AMBULATORY_CARE_PROVIDER_SITE_OTHER): Payer: Medicare Other

## 2010-09-17 DIAGNOSIS — Z23 Encounter for immunization: Secondary | ICD-10-CM

## 2010-09-17 MED ORDER — INFLUENZA VAC TYPES A & B PF IM SUSP: 0.5000 mL | Freq: Once | INTRAMUSCULAR | Status: DC

## 2010-09-19 NOTE — Progress Notes (Signed)
Patient received with no adverse effects

## 2010-09-22 ENCOUNTER — Other Ambulatory Visit: Payer: Self-pay | Admitting: Family Medicine

## 2010-10-10 ENCOUNTER — Telehealth: Payer: Self-pay | Admitting: Family Medicine

## 2010-10-10 DIAGNOSIS — M069 Rheumatoid arthritis, unspecified: Secondary | ICD-10-CM

## 2010-10-10 MED ORDER — METHOTREXATE SODIUM 2.5 MG PO TABS: ORAL_TABLET | ORAL | Status: DC

## 2010-10-10 NOTE — Telephone Encounter (Signed)
Refill sent.

## 2010-10-11 ENCOUNTER — Other Ambulatory Visit: Payer: Self-pay | Admitting: Family Medicine

## 2010-12-11 ENCOUNTER — Other Ambulatory Visit: Payer: Self-pay | Admitting: Family Medicine

## 2010-12-13 ENCOUNTER — Encounter (HOSPITAL_COMMUNITY): Payer: Self-pay | Admitting: *Deleted

## 2010-12-13 ENCOUNTER — Other Ambulatory Visit: Payer: Self-pay

## 2010-12-13 ENCOUNTER — Inpatient Hospital Stay (HOSPITAL_COMMUNITY)
Admission: EM | Admit: 2010-12-13 | Discharge: 2010-12-20 | DRG: 418 | Disposition: A | Discharge status: Home / Self Care | Payer: Medicare Other | Attending: Internal Medicine | Admitting: Internal Medicine

## 2010-12-13 DIAGNOSIS — I10 Essential (primary) hypertension: Secondary | ICD-10-CM | POA: Diagnosis present

## 2010-12-13 DIAGNOSIS — D649 Anemia, unspecified: Secondary | ICD-10-CM | POA: Diagnosis present

## 2010-12-13 DIAGNOSIS — R8281 Pyuria: Secondary | ICD-10-CM | POA: Diagnosis present

## 2010-12-13 DIAGNOSIS — N179 Acute kidney failure, unspecified: Secondary | ICD-10-CM | POA: Diagnosis present

## 2010-12-13 DIAGNOSIS — D62 Acute posthemorrhagic anemia: Secondary | ICD-10-CM | POA: Diagnosis not present

## 2010-12-13 DIAGNOSIS — I498 Other specified cardiac arrhythmias: Secondary | ICD-10-CM | POA: Diagnosis not present

## 2010-12-13 DIAGNOSIS — I519 Heart disease, unspecified: Secondary | ICD-10-CM | POA: Diagnosis present

## 2010-12-13 DIAGNOSIS — I495 Sick sinus syndrome: Secondary | ICD-10-CM | POA: Diagnosis present

## 2010-12-13 DIAGNOSIS — M0579 Rheumatoid arthritis with rheumatoid factor of multiple sites without organ or systems involvement: Secondary | ICD-10-CM | POA: Diagnosis present

## 2010-12-13 DIAGNOSIS — R82998 Other abnormal findings in urine: Secondary | ICD-10-CM | POA: Diagnosis present

## 2010-12-13 DIAGNOSIS — I5189 Other ill-defined heart diseases: Secondary | ICD-10-CM | POA: Diagnosis present

## 2010-12-13 DIAGNOSIS — K8000 Calculus of gallbladder with acute cholecystitis without obstruction: Principal | ICD-10-CM | POA: Diagnosis present

## 2010-12-13 DIAGNOSIS — R079 Chest pain, unspecified: Secondary | ICD-10-CM | POA: Diagnosis present

## 2010-12-13 DIAGNOSIS — K821 Hydrops of gallbladder: Secondary | ICD-10-CM | POA: Diagnosis present

## 2010-12-13 DIAGNOSIS — K8020 Calculus of gallbladder without cholecystitis without obstruction: Secondary | ICD-10-CM | POA: Diagnosis present

## 2010-12-13 DIAGNOSIS — M069 Rheumatoid arthritis, unspecified: Secondary | ICD-10-CM | POA: Diagnosis present

## 2010-12-13 DIAGNOSIS — E785 Hyperlipidemia, unspecified: Secondary | ICD-10-CM

## 2010-12-13 DIAGNOSIS — K810 Acute cholecystitis: Secondary | ICD-10-CM | POA: Diagnosis present

## 2010-12-13 DIAGNOSIS — E876 Hypokalemia: Secondary | ICD-10-CM | POA: Diagnosis present

## 2010-12-13 HISTORY — DX: Anemia, unspecified: D64.9

## 2010-12-13 HISTORY — DX: Acute cholecystitis: K81.0

## 2010-12-13 HISTORY — DX: Essential (primary) hypertension: I10

## 2010-12-13 HISTORY — DX: Other ill-defined heart diseases: I51.89

## 2010-12-13 HISTORY — DX: Acute kidney failure, unspecified: N17.9

## 2010-12-13 HISTORY — DX: Hypokalemia: E87.6

## 2010-12-13 HISTORY — DX: Systemic involvement of connective tissue, unspecified: M35.9

## 2010-12-13 HISTORY — DX: Hypercalcemia: E83.52

## 2010-12-13 MED ORDER — GI COCKTAIL ~~LOC~~
30.0000 mL | Freq: Once | ORAL | Status: AC
  Administered 2010-12-14: 30 mL via ORAL
  Filled 2010-12-13: qty 30

## 2010-12-13 NOTE — ED Notes (Signed)
Pt c/o pain in lower back/rib cage area that radiates around to chest.

## 2010-12-14 ENCOUNTER — Emergency Department (HOSPITAL_COMMUNITY): Payer: Medicare Other

## 2010-12-14 ENCOUNTER — Observation Stay (HOSPITAL_COMMUNITY): Payer: Medicare Other

## 2010-12-14 ENCOUNTER — Encounter (HOSPITAL_COMMUNITY): Payer: Self-pay | Admitting: Internal Medicine

## 2010-12-14 ENCOUNTER — Other Ambulatory Visit: Payer: Self-pay

## 2010-12-14 DIAGNOSIS — R079 Chest pain, unspecified: Secondary | ICD-10-CM | POA: Diagnosis present

## 2010-12-14 DIAGNOSIS — K8020 Calculus of gallbladder without cholecystitis without obstruction: Secondary | ICD-10-CM | POA: Diagnosis present

## 2010-12-14 DIAGNOSIS — D649 Anemia, unspecified: Secondary | ICD-10-CM | POA: Diagnosis present

## 2010-12-14 DIAGNOSIS — N179 Acute kidney failure, unspecified: Secondary | ICD-10-CM | POA: Diagnosis present

## 2010-12-14 DIAGNOSIS — E876 Hypokalemia: Secondary | ICD-10-CM

## 2010-12-14 DIAGNOSIS — I10 Essential (primary) hypertension: Secondary | ICD-10-CM | POA: Diagnosis present

## 2010-12-14 HISTORY — DX: Acute kidney failure, unspecified: N17.9

## 2010-12-14 HISTORY — DX: Hypokalemia: E87.6

## 2010-12-14 HISTORY — DX: Anemia, unspecified: D64.9

## 2010-12-14 LAB — DIFFERENTIAL
Basophils Relative: 0 % (ref 0–1)
Eosinophils Absolute: 0.1 10*3/uL (ref 0.0–0.7)
Monocytes Absolute: 0.2 10*3/uL (ref 0.1–1.0)
Monocytes Relative: 2 % — ABNORMAL LOW (ref 3–12)

## 2010-12-14 LAB — COMPREHENSIVE METABOLIC PANEL
Albumin: 4 g/dL (ref 3.5–5.2)
BUN: 27 mg/dL — ABNORMAL HIGH (ref 6–23)
Creatinine, Ser: 1.46 mg/dL — ABNORMAL HIGH (ref 0.50–1.10)
Total Protein: 6.7 g/dL (ref 6.0–8.3)

## 2010-12-14 LAB — CARDIAC PANEL(CRET KIN+CKTOT+MB+TROPI)
Relative Index: 2 (ref 0.0–2.5)
Troponin I: <0.3 ng/mL (ref <0.30)
Troponin I: <0.3 ng/mL (ref <0.30)

## 2010-12-14 LAB — POCT I-STAT TROPONIN I

## 2010-12-14 LAB — CBC
HCT: 33.3 % — ABNORMAL LOW (ref 36.0–46.0)
Hemoglobin: 11.3 g/dL — ABNORMAL LOW (ref 12.0–15.0)
MCH: 32.9 pg (ref 26.0–34.0)
MCHC: 33.9 g/dL (ref 30.0–36.0)

## 2010-12-14 LAB — HEPATIC FUNCTION PANEL
ALT: 14 U/L (ref 0–35)
AST: 21 U/L (ref 0–37)
Alkaline Phosphatase: 76 U/L (ref 39–117)
Bilirubin, Direct: <0.1 mg/dL (ref 0.0–0.3)
Total Bilirubin: 0.5 mg/dL (ref 0.3–1.2)

## 2010-12-14 MED ORDER — ROSUVASTATIN CALCIUM 20 MG PO TABS
10.0000 mg | ORAL_TABLET | Freq: Every day | ORAL | Status: DC
  Administered 2010-12-14 – 2010-12-18 (×4): 10 mg via ORAL
  Filled 2010-12-14 (×4): qty 1

## 2010-12-14 MED ORDER — LISINOPRIL 10 MG PO TABS
20.0000 mg | ORAL_TABLET | Freq: Every day | ORAL | Status: DC
  Administered 2010-12-14 – 2010-12-20 (×6): 20 mg via ORAL
  Filled 2010-12-14 (×6): qty 2

## 2010-12-14 MED ORDER — POTASSIUM CHLORIDE IN NACL 40-0.9 MEQ/L-% IV SOLN
INTRAVENOUS | Status: AC
  Administered 2010-12-14: 12:00:00 via INTRAVENOUS
  Filled 2010-12-14: qty 1000

## 2010-12-14 MED ORDER — FENTANYL CITRATE 0.05 MG/ML IJ SOLN
25.0000 ug | INTRAMUSCULAR | Status: DC | PRN
  Administered 2010-12-14 – 2010-12-17 (×2): 25 ug via INTRAVENOUS
  Filled 2010-12-14 (×2): qty 2

## 2010-12-14 MED ORDER — SODIUM CHLORIDE 0.9 % IJ SOLN
INTRAMUSCULAR | Status: AC
  Filled 2010-12-14: qty 3

## 2010-12-14 MED ORDER — NITROGLYCERIN 0.4 MG SL SUBL
0.4000 mg | SUBLINGUAL_TABLET | Freq: Once | SUBLINGUAL | Status: AC
  Administered 2010-12-14: 0.4 mg via SUBLINGUAL

## 2010-12-14 MED ORDER — FENTANYL CITRATE 0.05 MG/ML IJ SOLN
25.0000 ug | Freq: Once | INTRAMUSCULAR | Status: AC
  Administered 2010-12-14: 25 ug via INTRAVENOUS
  Filled 2010-12-14: qty 2

## 2010-12-14 MED ORDER — MORPHINE SULFATE 4 MG/ML IJ SOLN
INTRAMUSCULAR | Status: AC
  Administered 2010-12-14: 1 mg
  Filled 2010-12-14: qty 1

## 2010-12-14 MED ORDER — ONDANSETRON HCL 4 MG/2ML IJ SOLN
4.0000 mg | Freq: Four times a day (QID) | INTRAMUSCULAR | Status: DC | PRN
  Administered 2010-12-14 – 2010-12-17 (×3): 4 mg via INTRAVENOUS
  Filled 2010-12-14 (×3): qty 2

## 2010-12-14 MED ORDER — IOHEXOL 350 MG/ML SOLN
100.0000 mL | Freq: Once | INTRAVENOUS | Status: AC | PRN
  Administered 2010-12-14: 100 mL via INTRAVENOUS

## 2010-12-14 MED ORDER — ASPIRIN EC 81 MG PO TBEC
81.0000 mg | DELAYED_RELEASE_TABLET | Freq: Every day | ORAL | Status: DC
  Administered 2010-12-14 – 2010-12-18 (×4): 81 mg via ORAL
  Filled 2010-12-14 (×4): qty 1

## 2010-12-14 MED ORDER — NITROGLYCERIN 0.4 MG SL SUBL
0.4000 mg | SUBLINGUAL_TABLET | Freq: Once | SUBLINGUAL | Status: AC
  Administered 2010-12-14: 0.4 mg via SUBLINGUAL
  Filled 2010-12-14: qty 25

## 2010-12-14 MED ORDER — POTASSIUM CHLORIDE 20 MEQ PO PACK
40.0000 meq | PACK | Freq: Once | ORAL | Status: AC
  Administered 2010-12-14: 40 meq via ORAL
  Filled 2010-12-14: qty 2

## 2010-12-14 MED ORDER — PANTOPRAZOLE SODIUM 40 MG IV SOLR
40.0000 mg | INTRAVENOUS | Status: DC
  Administered 2010-12-14 – 2010-12-15 (×2): 40 mg via INTRAVENOUS
  Filled 2010-12-14: qty 40

## 2010-12-14 MED ORDER — ONDANSETRON HCL 4 MG PO TABS
4.0000 mg | ORAL_TABLET | Freq: Four times a day (QID) | ORAL | Status: DC | PRN
  Administered 2010-12-19: 4 mg via ORAL
  Filled 2010-12-14: qty 1

## 2010-12-14 MED ORDER — DILTIAZEM HCL ER COATED BEADS 120 MG PO CP24
120.0000 mg | ORAL_CAPSULE | Freq: Every day | ORAL | Status: DC
  Administered 2010-12-14 – 2010-12-20 (×6): 120 mg via ORAL
  Filled 2010-12-14 (×6): qty 1

## 2010-12-14 MED ORDER — NITROGLYCERIN 2 % TD OINT
1.0000 [in_us] | TOPICAL_OINTMENT | Freq: Three times a day (TID) | TRANSDERMAL | Status: DC
  Administered 2010-12-14: 1 [in_us] via TOPICAL
  Filled 2010-12-14: qty 1

## 2010-12-14 MED ORDER — HYDROCHLOROTHIAZIDE 25 MG PO TABS
25.0000 mg | ORAL_TABLET | Freq: Every day | ORAL | Status: DC
Start: 2010-12-14 — End: 2010-12-14

## 2010-12-14 MED ORDER — MORPHINE SULFATE 2 MG/ML IJ SOLN
1.0000 mg | INTRAMUSCULAR | Status: DC | PRN
  Administered 2010-12-14: 1 mg via INTRAVENOUS
  Filled 2010-12-14: qty 1

## 2010-12-14 MED ORDER — POTASSIUM CHLORIDE CRYS ER 20 MEQ PO TBCR
20.0000 meq | EXTENDED_RELEASE_TABLET | Freq: Two times a day (BID) | ORAL | Status: AC
  Administered 2010-12-14: 20 meq via ORAL
  Filled 2010-12-14 (×2): qty 1

## 2010-12-14 MED ORDER — ALUM & MAG HYDROXIDE-SIMETH 200-200-20 MG/5ML PO SUSP
30.0000 mL | Freq: Four times a day (QID) | ORAL | Status: DC | PRN
  Administered 2010-12-14: 30 mL via ORAL
  Filled 2010-12-14: qty 30

## 2010-12-14 MED ORDER — LISINOPRIL-HYDROCHLOROTHIAZIDE 20-25 MG PO TABS: 1.0000 | ORAL_TABLET | Freq: Every day | ORAL | Status: DC

## 2010-12-14 NOTE — ED Provider Notes (Signed)
History     CSN: [id number] Arrival date & time: 12/13/2010 11:47 PM   First MD Initiated Contact with Patient 12/13/10 2355      Chief Complaint  Patient presents with  . Chest Pain  . Back Pain    rib pain    (Consider location/radiation/quality/duration/timing/severity/associated sxs/prior treatment) Patient is a 77 y.o. female presenting with chest pain and back pain. The history is provided by the patient. No language interpreter was used.  Chest Pain The chest pain began 3 - 5 hours ago. Chest pain occurs constantly. The chest pain is unchanged. Associated with: nothing. At its most intense, the pain is at 8/10. The pain is currently at 8/10. The severity of the pain is severe. The quality of the pain is described as sharp. The pain does not radiate (originally left axillary x 1 hour now sscp ). Exacerbated by: nothing. Pertinent negatives for primary symptoms include no fever, no fatigue, no syncope, no shortness of breath, no cough, no wheezing, no palpitations, no nausea, no vomiting, no dizziness and no altered mental status.  Pertinent negatives for associated symptoms include no claudication, no diaphoresis, no lower extremity edema, no near-syncope and no weakness. She tried nothing for the symptoms. Risk factors include being elderly (htn and cholesterol).  Her past medical history is significant for hypertension.  Pertinent negatives for past medical history include no aneurysm.  Pertinent negatives for family medical history include: family history of aortic dissection.  Procedure history is negative for cardiac catheterization.    Back Pain  Associated symptoms include chest pain. Pertinent negatives include no fever and no weakness.    Past Medical History  Diagnosis Date  . Essential hypertension, benign   . Mixed hyperlipidemia   . Rheumatoid arthritis   . Osteoarthritis   . Bradycardia     Sinus - noted 6/11    Past Surgical History  Procedure Date  .  Bilateral cataract surgery 2009  . L2-l3 discectomy 2002  . Right shoulder cyst excision 2010  . L3-l4, l4-l5 lumbar decompression 2011    Family History  Problem Relation Age of Onset  . Stroke Mother   . Cancer Father   . Diabetes Sister   . Diabetes Sister   . Kidney failure Brother   . Kidney failure Brother     History  Substance Use Topics  . Smoking status: Never Smoker   . Smokeless tobacco: Never Used   Comment: Second hand smoke   . Alcohol Use: No    OB History    Grav Para Term Preterm Abortions TAB SAB Ect Mult Living                  Review of Systems  Constitutional: Negative for fever, diaphoresis and fatigue.  HENT: Negative for facial swelling.   Eyes: Negative for discharge.  Respiratory: Negative for cough, shortness of breath and wheezing.   Cardiovascular: Positive for chest pain. Negative for palpitations, claudication, syncope and near-syncope.  Gastrointestinal: Negative for nausea and vomiting.  Genitourinary: Negative for difficulty urinating.  Musculoskeletal: Positive for back pain.  Neurological: Negative for dizziness and weakness.  Hematological: Negative.   Psychiatric/Behavioral: Negative.  Negative for altered mental status.    Allergies  Aspirin  Home Medications   Current Outpatient Rx  Name Route Sig Dispense Refill  . ACYCLOVIR 400 MG PO TABS Oral Take 1 tablet (400 mg total) by mouth 2 (two) times daily. 180 tablet 0  . ASPIRIN 81 MG PO TABS  Oral Take 81 mg by mouth daily.      Marland Kitchen DILTIAZEM HCL ER COATED BEADS 120 MG PO CP24 Oral Take 1 capsule (120 mg total) by mouth daily. 90 capsule 0  . LISINOPRIL-HYDROCHLOROTHIAZIDE 20-25 MG PO TABS  TAKE 1 TABLET BY MOUTH ONCE A DAY 90 tablet 0  . MELOXICAM 15 MG PO TABS  TAKE 1 TABLET BY MOUTH ONCE A DAY 90 tablet 0  . METHOTREXATE SODIUM 2.5 MG PO TABS  Take 6 tablets by mouth on Thursdays 72 tablet 1  . ROSUVASTATIN CALCIUM 10 MG PO TABS Oral Take 1 tablet (10 mg total) by mouth  daily. Take one tablet by mouth once a day 90 tablet 2    BP 176/69  Temp 98.3 F (36.8 C)  Resp 20  Ht 5\' 2"  (1.575 m)  Wt 138 lb (62.596 kg)  BMI 25.24 kg/m2  SpO2 99%  Physical Exam  Vitals reviewed. Constitutional: She is oriented to person, place, and time. She appears well-developed and well-nourished.  HENT:  Head: Normocephalic and atraumatic.  Eyes: Conjunctivae and EOM are normal. Pupils are equal, round, and reactive to light.  Neck: Normal range of motion. Neck supple. No thyromegaly present.  Cardiovascular: Normal rate and regular rhythm.  Exam reveals no friction rub.   Pulmonary/Chest: Effort normal and breath sounds normal. She has no wheezes. She has no rales. She exhibits no tenderness.  Abdominal: Soft. Bowel sounds are normal. There is no tenderness. There is no rebound and no guarding.  Musculoskeletal: Normal range of motion. She exhibits no edema.  Neurological: She is alert and oriented to person, place, and time.  Skin: Skin is warm and dry.  Psychiatric: Thought content normal.    ED Course  Procedures (including critical care time)   Labs Reviewed  CBC  DIFFERENTIAL  COMPREHENSIVE METABOLIC PANEL  LIPASE, BLOOD  I-STAT TROPONIN I   No results found.   No diagnosis found.    MDM   Date: 12/14/2010  Rate: 79  Rhythm: sinus arrhythmia  QRS Axis: normal  Intervals: normal  ST/T Wave abnormalities: nonspecific ST changes  Conduction Disutrbances:none  Narrative Interpretation:   Old EKG Reviewed: none available   The patient appears reasonably stabilized for admission considering the current resources, flow, and capabilities available in the ED at this time, and I doubt any other [facility] requiring further screening and/or treatment in the ED prior to admission.  MDM Reviewed: vitals Interpretation: labs, ECG and x-ray Consults: admitting MD       Keairra Bardon K Ilham Roughton-Rasch, MD 12/14/10 [phone]

## 2010-12-14 NOTE — ED Notes (Signed)
Family at bedside. 

## 2010-12-14 NOTE — ED Notes (Signed)
Pt states chest pain and back pain now relieved after 3rd nitro, complains now only of stomach pain.

## 2010-12-14 NOTE — Progress Notes (Signed)
The patient is a 77 year old woman with a past medical history significant for hypertension, bradycardia, hyperlipidemia, and rheumatoid arthritis. She was admitted this morning by Dr. Onalee Hua for chest pain. The patient was briefly seen. Her lab data and vital signs were reviewed.  The the CT angiogram of her chest was negative for PE, however, it did reveal a distended gallbladder with a stone in the gallbladder neck. This prompted an order for an ultrasound of her abdomen. It revealed a 2.7 cm gallstone in the gallbladder neck, but no radiographic signs of acute cholecystitis. A HIDA scan will be ordered as it is suspected that she is having biliary colic.  She is also noted to have hypokalemia, acute renal insufficiency, and hypercalcemia. Hydrochlorothiazide will be discontinued. She will be continued on IV fluid hydration with potassium chloride added. We will replete potassium chloride orally as well. We'll follow her electrolytes closely.  We'll downgrade her diet to a clear liquid diet as she had an episode of nausea and vomiting this morning.

## 2010-12-14 NOTE — ED Notes (Signed)
Family at bedside. Patient was helped in using the bedpan.

## 2010-12-14 NOTE — ED Notes (Signed)
Pt states C/P decreased to a 4 from a 6; pt can tell a effect of the nitro; pt states stomach continues to hurt

## 2010-12-14 NOTE — ED Notes (Signed)
Pt c/o CP started at 2100 on 12/8 non radiating center of chest; no N/V

## 2010-12-14 NOTE — ED Notes (Signed)
Second dose nitro given 0.4mg 

## 2010-12-14 NOTE — H&P (Signed)
PCP:   [name], MD, MD   Chief Complaint:  Chest pain radiating to the back for one day  HPI: 77 year old female presents with substernal chest pain and pain between the blades of her shoulders and the back that has been waxing and waning over the last several hours. She has heartburn she thought this was initially just her regular heartburn however when the pain started in the back she got concerned and came to the emergency department. She denies any diaphoresis nausea vomiting or shortness of breath associated with chest pain. She states it sharp in nature and but also burning. She denies any fevers at home or any recent illnesses. The pain comes and goes and it had gotten better in the emergency department but now she is in the room and she does got to the bathroom and the pain is back now. She denies any coughing up any blood she denies any cough denies any dizziness.  Review of Systems:  Otherwise negative  Past Medical History: Past Medical History  Diagnosis Date  . Essential hypertension, benign   . Mixed hyperlipidemia   . Rheumatoid arthritis   . Osteoarthritis   . Bradycardia     Sinus - noted 6/11   Past Surgical History  Procedure Date  . Bilateral cataract surgery 2009  . L2-l3 discectomy 2002  . Right shoulder cyst excision 2010  . L3-l4, l4-l5 lumbar decompression 2011    Medications: Prior to Admission medications   Medication Sig Start Date End Date Taking? Authorizing Provider  acyclovir (ZOVIRAX) 400 MG tablet Take 1 tablet (400 mg total) by mouth 2 (two) times daily. 08/28/10  Yes [name], MD  aspirin 81 MG tablet Take 81 mg by mouth daily.     Yes Historical Provider, MD  diltiazem (CARDIZEM CD) 120 MG 24 hr capsule Take 1 capsule (120 mg total) by mouth daily. 08/28/10  Yes [name], MD  lisinopril-hydrochlorothiazide (PRINZIDE,ZESTORETIC) 20-25 MG per tablet TAKE 1 TABLET BY MOUTH ONCE A DAY 10/11/10  Yes [name], MD    meloxicam (MOBIC) 15 MG tablet TAKE 1 TABLET BY MOUTH ONCE A DAY 12/11/10  Yes [name], MD  methotrexate 2.5 MG tablet Take 6 tablets by mouth on Thursdays 10/10/10  Yes [name], MD  rosuvastatin (CRESTOR) 10 MG tablet Take 1 tablet (10 mg total) by mouth daily. Take one tablet by mouth once a day 04/29/10  Yes [name], MD    Allergies:   Allergies  Allergen Reactions  . Aspirin     REACTION: burns stomach (uncoated)    Social History:  reports that she has never smoked. She has never used smokeless tobacco. She reports that she does not drink alcohol or use illicit drugs.  Family History: Family History  Problem Relation Age of Onset  . Stroke Mother   . Cancer Father   . Diabetes Sister   . Diabetes Sister   . Kidney failure Brother   . Kidney failure Brother     Physical Exam: Filed Vitals:   12/14/10 0111 12/14/10 0144 12/14/10 0150 12/14/10 0350  BP: 100/51 130/65 135/56 128/56  Pulse: 52 55 64 46  Temp:    98.1 F (36.7 C)  TempSrc:    Oral  Resp:    20  Height:    5' 1.5" (1.562 m)  Weight:    61.1 kg (134 lb 11.2 oz)  SpO2:    98%   General appearance: alert, cooperative and no distress Resp:  clear to auscultation bilaterally Cardio: regular rate and rhythm, S1, S2 normal, no murmur, click, rub or gallop GI: soft, non-tender; bowel sounds normal; no masses,  no organomegaly Extremities: extremities normal, atraumatic, no cyanosis or edema Pulses: 2+ and symmetric Skin: Skin color, texture, turgor normal. No rashes or lesions Neurologic: Grossly normal   Labs on Admission:   [facility] 12/14/10 0011  NA 139  K 3.2*  CL 99  CO2 30  GLUCOSE 121*  BUN 27*  CREATININE 1.46*  CALCIUM 11.2*  MG --  PHOS --    Basename 12/14/10 0011  AST 23  ALT 15  ALKPHOS 83  BILITOT 0.4  PROT 6.7  ALBUMIN 4.0    Basename 12/14/10 0011  LIPASE 29  AMYLASE --    Basename 12/14/10 0011  WBC 7.2  NEUTROABS 5.2  HGB 11.3*  HCT  33.3*  MCV 97.1  PLT 243   Radiological Exams on Admission: Dg Chest 2 View  12/14/2010  *RADIOLOGY REPORT*  Clinical Data: Chest pain and left shoulder pain  CHEST - 2 VIEW  Comparison: 05/24/2009  Findings: The heart size and pulmonary vascularity are normal. The lungs appear clear and expanded without focal air space disease or consolidation. No blunting of the costophrenic angles.  Scattered calcified granulomas in the lungs.  Degenerative changes in the thoracic spine and shoulders.  Stable appearance since previous study.  IMPRESSION: No evidence of active pulmonary disease.  Original Report Authenticated By: Marlon Pel, M.D.    Assessment/Plan Present on Admission:  77 year old female with a history of hypertension rheumatoid arthritis who presents with atypical chest pain that radiates to her back  .Chest pain with radiation to the back we'll obtained a stat CTA of her chest to rule out dissection and also ascertain for pulmonary emboli. We will serial cardiac enzymes place on telemetry provide her with some morphine right now and nitroglycerin paste. We'll also obtain a 2-D echo which may be able to be done as an outpatient if her other workup is negative. Placed on coated baby aspirin 12-lead EKG shows no acute issues. History rheumatoid arthritis this is stable Hypertension stable  Bryanda Mikel A 12/14/2010, 5:02 AM

## 2010-12-14 NOTE — ED Notes (Signed)
Patient is resting comfortably. 

## 2010-12-14 NOTE — ED Notes (Signed)
Pt rates CP at 2 continues at center of chest. 2 SL nitro have been given, Last dose 7 min. B/P 108/60 HR 59

## 2010-12-15 ENCOUNTER — Observation Stay (HOSPITAL_COMMUNITY): Payer: Medicare Other

## 2010-12-15 ENCOUNTER — Encounter (HOSPITAL_COMMUNITY): Payer: Self-pay

## 2010-12-15 DIAGNOSIS — I059 Rheumatic mitral valve disease, unspecified: Secondary | ICD-10-CM

## 2010-12-15 DIAGNOSIS — I5189 Other ill-defined heart diseases: Secondary | ICD-10-CM | POA: Diagnosis present

## 2010-12-15 LAB — IRON AND TIBC
Iron: 13 ug/dL — ABNORMAL LOW (ref 42–135)
Saturation Ratios: 6 % — ABNORMAL LOW (ref 20–55)
UIBC: 214 ug/dL (ref 125–400)

## 2010-12-15 LAB — COMPREHENSIVE METABOLIC PANEL
Albumin: 3.2 g/dL — ABNORMAL LOW (ref 3.5–5.2)
BUN: 15 mg/dL (ref 6–23)
Calcium: 10.4 mg/dL (ref 8.4–10.5)
Creatinine, Ser: 0.93 mg/dL (ref 0.50–1.10)
GFR calc Af Amer: 67 mL/min — ABNORMAL LOW (ref >90)
Glucose, Bld: 130 mg/dL — ABNORMAL HIGH (ref 70–99)
Potassium: 3.9 mEq/L (ref 3.5–5.1)
Total Protein: 6.1 g/dL (ref 6.0–8.3)

## 2010-12-15 LAB — CBC
HCT: 31.1 % — ABNORMAL LOW (ref 36.0–46.0)
Hemoglobin: 10.4 g/dL — ABNORMAL LOW (ref 12.0–15.0)
MCV: 98.1 fL (ref 78.0–100.0)
RBC: 3.17 MIL/uL — ABNORMAL LOW (ref 3.87–5.11)
RDW: 14.7 % (ref 11.5–15.5)
WBC: 11.4 10*3/uL — ABNORMAL HIGH (ref 4.0–10.5)

## 2010-12-15 LAB — CARDIAC PANEL(CRET KIN+CKTOT+MB+TROPI)
Relative Index: INVALID (ref 0.0–2.5)
Troponin I: <0.3 ng/mL (ref <0.30)

## 2010-12-15 LAB — T4, FREE: Free T4: 0.97 ng/dL (ref 0.80–1.80)

## 2010-12-15 MED ORDER — TECHNETIUM TC 99M MEBROFENIN IV KIT
5.0000 | PACK | Freq: Once | INTRAVENOUS | Status: AC | PRN
  Administered 2010-12-15: 5.2 via INTRAVENOUS

## 2010-12-15 MED ORDER — POTASSIUM CHLORIDE IN NACL 20-0.9 MEQ/L-% IV SOLN
INTRAVENOUS | Status: DC
  Administered 2010-12-15: 16:00:00 via INTRAVENOUS

## 2010-12-15 MED ORDER — MORPHINE SULFATE 2 MG/ML IJ SOLN: 2.0000 mg | Freq: Once | INTRAMUSCULAR | Status: DC

## 2010-12-15 MED ORDER — ACYCLOVIR 400 MG PO TABS
400.0000 mg | ORAL_TABLET | Freq: Two times a day (BID) | ORAL | Status: DC
  Administered 2010-12-15: 400 mg via ORAL
  Filled 2010-12-15 (×4): qty 1

## 2010-12-15 MED ORDER — PIPERACILLIN-TAZOBACTAM 3.375 G IVPB
INTRAVENOUS | Status: AC
  Filled 2010-12-15: qty 100

## 2010-12-15 MED ORDER — PANTOPRAZOLE SODIUM 40 MG PO TBEC
40.0000 mg | DELAYED_RELEASE_TABLET | Freq: Every day | ORAL | Status: DC
  Administered 2010-12-16 – 2010-12-20 (×4): 40 mg via ORAL
  Filled 2010-12-15 (×5): qty 1

## 2010-12-15 MED ORDER — SODIUM CHLORIDE 0.9 % IJ SOLN
INTRAMUSCULAR | Status: AC
  Administered 2010-12-15: 14:00:00
  Filled 2010-12-15: qty 6

## 2010-12-15 MED ORDER — SODIUM CHLORIDE 0.9 % IJ SOLN
INTRAMUSCULAR | Status: AC
  Administered 2010-12-15: 16:00:00
  Filled 2010-12-15: qty 3

## 2010-12-15 MED ORDER — ACYCLOVIR 200 MG PO CAPS
ORAL_CAPSULE | ORAL | Status: AC
  Filled 2010-12-15: qty 2

## 2010-12-15 MED ORDER — PIPERACILLIN-TAZOBACTAM 3.375 G IVPB
3.3750 g | Freq: Three times a day (TID) | INTRAVENOUS | Status: DC
  Administered 2010-12-15 – 2010-12-19 (×12): 3.375 g via INTRAVENOUS
  Filled 2010-12-15 (×21): qty 50

## 2010-12-15 NOTE — Progress Notes (Signed)
UR Chart Review Completed  

## 2010-12-15 NOTE — Consult Note (Signed)
ANTIBIOTIC CONSULT NOTE - INITIAL  Pharmacy Consult for Zosyn Indication: empiric ABX Rx  Allergies  Allergen Reactions  . Aspirin     REACTION: burns stomach (uncoated)   Patient Measurements: Height: 5' 1.5" (156.2 cm) Weight: 134 lb 11.2 oz (61.1 kg) IBW/kg (Calculated) : 48.95   Vital Signs: Temp: 98.6 F (37 C) (12/10 1442) Temp src: Oral (12/10 1442) BP: 117/71 mmHg (12/10 1442) Pulse Rate: 57  (12/10 1442) Intake/Output from previous day: 12/09 0701 - 12/10 0700 In: 1070 [P.O.:120; I.V.:950] Out: 276 [Urine:275; Emesis/NG output:1] Intake/Output from this shift:    Labs:  [facility] 12/15/10 0459 12/14/10 0011  WBC 11.4* 7.2  HGB 10.4* 11.3*  PLT 207 243  LABCREA -- --  CREATININE 0.93 1.46*   Estimated Creatinine Clearance: 43.7 ml/min (by C-G formula based on Cr of 0.93). No results found for this basename: VANCOTROUGH:2,VANCOPEAK:2,VANCORANDOM:2,GENTTROUGH:2,GENTPEAK:2,GENTRANDOM:2,TOBRATROUGH:2,TOBRAPEAK:2,TOBRARND:2,AMIKACINPEAK:2,AMIKACINTROU:2,AMIKACIN:2, in the last 72 hours   Microbiology: No results found for this or any previous visit (from the past 720 hour(s)).  Medical History: Past Medical History  Diagnosis Date  . Essential hypertension, benign   . Mixed hyperlipidemia   . Rheumatoid arthritis   . Osteoarthritis   . Bradycardia     Sinus - noted 6/11  . Hypokalemia 12/14/2010  . HTN (hypertension) 12/14/2010  . ARF (acute renal failure) 12/14/2010  . Hypercalcemia 12/14/2010  . Anemia 12/14/2010  . Cholelithiasis 12/14/2010  . Collagen vascular disease   . Diastolic dysfunction 12/15/2010    Grade 1 per 2-D echocardiogram   Medications:  Scheduled:    . acyclovir  400 mg Oral BID  . aspirin EC  81 mg Oral Daily  . diltiazem  120 mg Oral Daily  . lisinopril  20 mg Oral Daily  . pantoprazole  40 mg Oral Q1200  . piperacillin-tazobactam (ZOSYN)  IV  3.375 g Intravenous Q8H  . potassium chloride  20 mEq Oral BID  . rosuvastatin  10  mg Oral Daily  . sodium chloride      . sodium chloride      . sodium chloride      . DISCONTD:  morphine injection  2 mg Intravenous Once  . DISCONTD: pantoprazole (PROTONIX) IV  40 mg Intravenous Q24H   Assessment: SCr improved clcr > 40  Goal of Therapy:  Eradicate infection.  Plan: Zosyn 3.375gm iv q8hrs Monitor labs per protocol  Valrie Hart A 12/15/2010,6:13 PM

## 2010-12-15 NOTE — Progress Notes (Signed)
*  PRELIMINARY RESULTS* Echocardiogram 2D Echocardiogram has been performed.  Stacy Meyers 12/15/2010, 10:46 AM

## 2010-12-15 NOTE — Progress Notes (Signed)
Subjective: The patient says that she feels a little bit better. She says that her abdomen is sore but it is less sore than yesterday. She still has some upper and lower back pain. She denies further nausea.  Objective: Vital signs in last 24 hours: Filed Vitals:   12/14/10 2144 12/15/10 0428 12/15/10 1228 12/15/10 1442  BP: 122/75 118/60  117/71  Pulse: 79 66  57  Temp: 98.3 F (36.8 C) 99.4 F (37.4 C)  98.6 F (37 C)  TempSrc: Oral Oral  Oral  Resp: 16 20  18   Height:      Weight:      SpO2: 99% 99% 100%     Intake/Output Summary (Last 24 hours) at 12/15/10 1453 Last data filed at 12/14/10 2149  Gross per 24 hour  Intake    120 ml  Output    276 ml  Net   -156 ml    Weight change:   Exam: General: The patient is a pleasant elderly after American woman who is currently laying in bed in no acute distress. Lungs: Decreased breath sounds in the bases otherwise clear. Heart: S1, S2, with a soft systolic murmur. Abdomen: Positive bowel sounds, soft, mildly tender in the epigastrium, no rebound, no guarding, no masses. Extremities: No pedal edema.  Lab Results: Basic Metabolic Panel:  Basename 12/15/10 0459 12/14/10 0011  NA 137 139  K 3.9 3.2*  CL 104 99  CO2 26 30  GLUCOSE 130* 121*  BUN 15 27*  CREATININE 0.93 1.46*  CALCIUM 10.4 11.2*  MG -- --  PHOS -- --   Liver Function Tests:  [facility] 12/15/10 0459 12/14/10 0753  AST 17 21  ALT 13 14  ALKPHOS 69 76  BILITOT 0.5 0.5  PROT 6.1 6.2  ALBUMIN 3.2* 3.5    Basename 12/14/10 0753 12/14/10 0011  LIPASE 20 29  AMYLASE -- --   No results found for this basename: AMMONIA:2 in the last 72 hours CBC:  Basename 12/15/10 0459 12/14/10 0011  WBC 11.4* 7.2  NEUTROABS -- 5.2  HGB 10.4* 11.3*  HCT 31.1* 33.3*  MCV 98.1 97.1  PLT 207 243   Cardiac Enzymes:  Basename 12/14/10 1543 12/14/10 0753 12/14/10  CKTOTAL 98 109 75  CKMB 2.0 2.2 1.7  CKMBINDEX -- -- --  TROPONINI <0.30 <0.30 <0.30   BNP: No  components found with this basename: POCBNP:3 D-Dimer: No results found for this basename: DDIMER:2 in the last 72 hours CBG: No results found for this basename: GLUCAP:6 in the last 72 hours Hemoglobin A1C: No results found for this basename: HGBA1C in the last 72 hours Fasting Lipid Panel: No results found for this basename: CHOL,HDL,LDLCALC,TRIG,CHOLHDL,LDLDIRECT in the last 72 hours Thyroid Function Tests:  Basename 12/15/10 0459  TSH 0.490  T4TOTAL --  FREET4 0.97  T3FREE --  THYROIDAB --   Anemia Panel: No results found for this basename: VITAMINB12,FOLATE,FERRITIN,TIBC,IRON,RETICCTPCT in the last 72 hours Coagulation: No results found for this basename: LABPROT:2,INR:2 in the last 72 hours Urine Drug Screen: Drugs of Abuse  No results found for this basename: labopia, cocainscrnur, labbenz, amphetmu, thcu, labbarb    Alcohol Level: No results found for this basename: ETH:2 in the last 72 hours  Micro: No results found for this or any previous visit (from the past 240 hour(s)).  Studies/Results: Dg Chest 2 View  12/14/2010  *RADIOLOGY REPORT*  Clinical Data: Chest pain and left shoulder pain  CHEST - 2 VIEW  Comparison: 05/24/2009  Findings:  The heart size and pulmonary vascularity are normal. The lungs appear clear and expanded without focal air space disease or consolidation. No blunting of the costophrenic angles.  Scattered calcified granulomas in the lungs.  Degenerative changes in the thoracic spine and shoulders.  Stable appearance since previous study.  IMPRESSION: No evidence of active pulmonary disease.  Original Report Authenticated By: Marlon Pel, M.D.   Ct Angio Chest W/cm &/or Wo Cm  12/14/2010  *RADIOLOGY REPORT*  Clinical Data:  Left shoulder and arm pain.  Chest pressure.  CT ANGIOGRAPHY CHEST WITH CONTRAST  Technique:  Multidetector CT imaging of the chest was performed using the standard protocol during bolus administration of intravenous  contrast.  Multiplanar CT image reconstructions including MIPs were obtained to evaluate the vascular anatomy.  Contrast: OMNIPAQUE IOHEXOL 350 MG/ML IV SOLN  Comparison:  None  Findings:  Technically adequate study with good opacification of the central and segmental pulmonary arteries.  No focal filling defects.  No evidence of significant pulmonary embolus.  Normal caliber thoracic aorta without evidence of dissection.  Mild calcification in the aorta.  Calcification in the coronary arteries.  No pleural effusions.  No significant lymphadenopathy in the chest.  Scattered fibrosis in the lungs.  Mild dependent atelectasis.  No focal airspace consolidation or interstitial infiltration.  No pneumothorax.  Airways appear patent.  Small esophageal hiatal hernia.  The esophagus is decompressed. Suggestion of a cyst in the lateral segment left lobe of the liver. Large stone in the neck of the gallbladder measuring about 2.5 cm diameter.  The gallbladder appears moderately distended without wall thickening or edema.  Degenerative changes in the thoracic spine and shoulders.  Review of the MIP images confirms the above findings.  IMPRESSION: No evidence of significant pulmonary embolus.  Incidental note of a distended gallbladder with stone in the gallbladder neck.  Coronary calcifications.  Esophageal hiatal hernia.  Original Report Authenticated By: Marlon Pel, M.D.   US Abdomen Complete  12/14/2010  *RADIOLOGY REPORT*  Clinical Data:  Abdominal pain, evaluate for cholelithiasis  COMPLETE ABDOMINAL ULTRASOUND  Comparison:  Partial comparison to CT chest dated 12/14/2010  Findings:  Gallbladder:  2.1 cm gallstone in the gallbladder neck.  No gallbladder wall thickening or pericholecystic fluid.  Negative sonographic Murphy's sign.  Common bile duct:  Measures 8 mm.  Liver:  No focal lesion identified.  Within normal limits in parenchymal echogenicity.  IVC:  Appears normal.  Pancreas:  Poorly visualized  due to overlying bowel gas.  Spleen:  Measures 2.7 cm.  Right Kidney:  Measures 8.3 cm.  1.6 x 1.1 x 1.4 cm lateral interpolar cyst.  No hydronephrosis.  Left Kidney:  Measures 11.8 cm.  Cortical thinning.  2.0 x 2.0 x 2.1 cm upper pole cyst.  Abdominal aorta:  No aneurysm identified.  Atherosclerotic calcifications.  IMPRESSION: Cholelithiasis, without associated findings to suggest acute cholecystitis.  Negative sonographic Murphy's sign.  Original Report Authenticated By: Charline Bills, M.D.    Medications: I have reviewed the patient's current medications.  Assessment: Principal Problem:  *Chest pain Active Problems:  SINUS BRADYCARDIA  Rheumatoid arthritis  Hypokalemia  HTN (hypertension)  ARF (acute renal failure)  Hypercalcemia  Anemia  Cholelithiasis  Diastolic dysfunction   1.Chest pain. Myocardial infarction ruled out. CT angiogram of the chest was negative for PE. Her 2-D echocardiogram revealed preserved LV function and mild grade 1 diastolic dysfunction. Protonix has been ordered empirically for possible esophagitis or gastroesophageal reflux disease.  Cholelithiasis with  a large gallstone in the gallbladder neck. It is possible that the patient's chest pain may be coming from biliary colic. Her lipase and liver transaminases are unremarkable. HIDA scan is pending. If abnormal, will consult general surgery.  Acute renal failure, secondary to prerenal azotemia. Resolved with IV fluids.  Hypokalemia. Resolved with repletion.  Hypercalcemia. Likely secondary to dehydration. Resolved with IV fluid hydration and discontinuation of hydrochlorothiazide.  Sinus bradycardia. She is asymptomatic. This may be secondary to calcium channel blocker. Her TSH and free T4 are within normal limits.  Normocytic anemia. Anemia studies are pending.  Rheumatoid arthritis. She is treated chronically with methotrexate and Mobic.  Hypertension. She is being maintained on diltiazem and  lisinopril, however hydrochlorothiazide was discontinued due to to hypercalcemia and dehydration.    Plan: Continue symptomatic treatment. Will check the results of the HIDA scan. We'll check the results of the anemia studies.    LOS: 2 days   Terryann Verbeek 12/15/2010, 2:53 PM

## 2010-12-16 ENCOUNTER — Encounter (HOSPITAL_COMMUNITY): Payer: Self-pay | Admitting: Internal Medicine

## 2010-12-16 DIAGNOSIS — R8281 Pyuria: Secondary | ICD-10-CM | POA: Diagnosis present

## 2010-12-16 DIAGNOSIS — K810 Acute cholecystitis: Secondary | ICD-10-CM | POA: Diagnosis present

## 2010-12-16 LAB — BASIC METABOLIC PANEL
BUN: 16 mg/dL (ref 6–23)
CO2: 27 mEq/L (ref 19–32)
Chloride: 104 mEq/L (ref 96–112)
GFR calc non Af Amer: 42 mL/min — ABNORMAL LOW (ref >90)
Glucose, Bld: 103 mg/dL — ABNORMAL HIGH (ref 70–99)
Potassium: 4 mEq/L (ref 3.5–5.1)
Sodium: 138 mEq/L (ref 135–145)

## 2010-12-16 LAB — URINALYSIS, ROUTINE W REFLEX MICROSCOPIC
Ketones, ur: NEGATIVE mg/dL
Nitrite: NEGATIVE
Protein, ur: NEGATIVE mg/dL
Urobilinogen, UA: 0.2 mg/dL (ref 0.0–1.0)

## 2010-12-16 LAB — CBC
HCT: 28.5 % — ABNORMAL LOW (ref 36.0–46.0)
Hemoglobin: 9.5 g/dL — ABNORMAL LOW (ref 12.0–15.0)
MCH: 32.8 pg (ref 26.0–34.0)
MCV: 98.3 fL (ref 78.0–100.0)
RBC: 2.9 MIL/uL — ABNORMAL LOW (ref 3.87–5.11)

## 2010-12-16 LAB — URINE MICROSCOPIC-ADD ON

## 2010-12-16 LAB — FERRITIN: Ferritin: 137 ng/mL (ref 10–291)

## 2010-12-16 MED ORDER — ACYCLOVIR 200 MG PO CAPS
400.0000 mg | ORAL_CAPSULE | Freq: Two times a day (BID) | ORAL | Status: DC
  Administered 2010-12-16 – 2010-12-20 (×8): 400 mg via ORAL
  Filled 2010-12-16 (×13): qty 2

## 2010-12-16 NOTE — Progress Notes (Signed)
Subjective: The patient says that she feels a little bit better. She is tolerating her full liquid diet. She has had no further nausea or vomiting. Her chest pain has resolved. She still has some discomfort in her back and epigastrium.  Objective: Vital signs in last 24 hours: Filed Vitals:   12/15/10 1228 12/15/10 1442 12/15/10 2200 12/16/10 0549  BP:  117/71 120/70 112/73  Pulse:  57 82 64  Temp:  98.6 F (37 C) 100.7 F (38.2 C) 98.4 F (36.9 C)  TempSrc:  Oral Oral Oral  Resp:  18  22  Height:      Weight:      SpO2: 100%  94% 99%    Intake/Output Summary (Last 24 hours) at 12/16/10 1107 Last data filed at 12/16/10 0900  Gross per 24 hour  Intake    120 ml  Output    700 ml  Net   -580 ml    Weight change:   Exam: General: The patient is a pleasant elderly after American woman who is currently laying in bed in no acute distress. Lungs: Decreased breath sounds in the bases otherwise clear. Heart: S1, S2, with a soft systolic murmur. Abdomen: Positive bowel sounds, soft, mildly tender in the epigastrium, no rebound, no guarding, no masses. Extremities: No pedal edema. Back: Mild tenderness around further thoracic- lumbar muscles without spasm.  Lab Results: Basic Metabolic Panel:  Basename 12/16/10 0529 12/15/10 0459  NA 138 137  K 4.0 3.9  CL 104 104  CO2 27 26  GLUCOSE 103* 130*  BUN 16 15  CREATININE 1.22* 0.93  CALCIUM 10.0 10.4  MG -- --  PHOS -- --   Liver Function Tests:  [facility] 12/15/10 0459 12/14/10 0753  AST 17 21  ALT 13 14  ALKPHOS 69 76  BILITOT 0.5 0.5  PROT 6.1 6.2  ALBUMIN 3.2* 3.5    Basename 12/14/10 0753 12/14/10 0011  LIPASE 20 29  AMYLASE -- --   No results found for this basename: AMMONIA:2 in the last 72 hours CBC:  Basename 12/16/10 0529 12/15/10 0459 12/14/10 0011  WBC 10.0 11.4* --  NEUTROABS -- -- 5.2  HGB 9.5* 10.4* --  HCT 28.5* 31.1* --  MCV 98.3 98.1 --  PLT 192 207 --   Cardiac Enzymes:  Basename  12/14/10 1543 12/14/10 0753 12/14/10  CKTOTAL 98 109 75  CKMB 2.0 2.2 1.7  CKMBINDEX -- -- --  TROPONINI <0.30 <0.30 <0.30   BNP: No components found with this basename: POCBNP:3 D-Dimer: No results found for this basename: DDIMER:2 in the last 72 hours CBG: No results found for this basename: GLUCAP:6 in the last 72 hours Hemoglobin A1C: No results found for this basename: HGBA1C in the last 72 hours Fasting Lipid Panel: No results found for this basename: CHOL,HDL,LDLCALC,TRIG,CHOLHDL,LDLDIRECT in the last 72 hours Thyroid Function Tests:  Basename 12/15/10 0459  TSH 0.490  T4TOTAL --  FREET4 0.97  T3FREE --  THYROIDAB --   Anemia Panel:  Basename 12/15/10 0459  VITAMINB12 318  FOLATE --  FERRITIN 137  TIBC 227*  IRON 13*  RETICCTPCT --   Coagulation: No results found for this basename: LABPROT:2,INR:2 in the last 72 hours Urine Drug Screen: Drugs of Abuse  No results found for this basename: labopia,  cocainscrnur,  labbenz,  amphetmu,  thcu,  labbarb    Alcohol Level: No results found for this basename: ETH:2 in the last 72 hours  Micro: No results found for this or  any previous visit (from the past 240 hour(s)).  Studies/Results: Nm Hepatobiliary Liver Func  12/15/2010  *RADIOLOGY REPORT*  Clinical Data: Cholelithiasis, pain  NUCLEAR MEDICINE HEPATOHBILIARY INCLUDE GB  Radiopharmaceutical: 5.2 mCi Tc-34m mebrofenin  Comparison: None Correlation:  Ultrasound abdomen 12/14/2010  Findings: Normal hepatocellular function with prompt tracer excretion from bloodstream. Prompt excretion of tracer into the biliary tree. Small bowel visualized by 12 minutes. Gallbladder does not visualize by 1 hour. Patient unable to receive morphine, has experienced significant nausea and vomiting with morphine administrations in past 24 hours. Delayed images of the gallbladder were performed at 1.5 and 4 hours. No delayed visualization of the gallbladder is identified by 4 hours.  Findings are compatible with cystic duct obstruction and acute cholecystitis.  IMPRESSION: Normal hepatocellular function. Nonvisualization of the gallbladder by 4 hours compatible with cystic duct obstruction or acute cholecystitis.  Original Report Authenticated By: Lollie Marrow, M.D.    Medications: I have reviewed the patient's current medications.  Assessment: Principal Problem:  *Chest pain Active Problems:  SINUS BRADYCARDIA  Rheumatoid arthritis  Hypokalemia  HTN (hypertension)  ARF (acute renal failure)  Hypercalcemia  Anemia  Cholelithiasis  Diastolic dysfunction  Pyuria  Acute cholecystitis   1.Chest pain. Myocardial infarction ruled out. CT angiogram of the chest was negative for PE. Her 2-D echocardiogram revealed preserved LV function and mild grade 1 diastolic dysfunction. Protonix was added empirically. It is likely that her chest pain was a consequence of acute cholecystitis.  Cholelithiasis with a large gallstone in the gallbladder neck and acute cholecystitis per HIDA scan and clinically. She is symptomatic and will likely require cholecystectomy. I discussed the patient with general surgeon Dr. Leticia Penna. He recommended starting Zosyn empirically which was done yesterday. We will await his official evaluation and consultation. Currently, the patient is much more comfortable and she is tolerating a full liquid diet. Her liver transaminases and lipase have been unremarkable.  Acute renal failure, secondary to prerenal azotemia. Resolved with IV fluids.  Hypokalemia. Resolved with repletion.  Hypercalcemia. Likely secondary to dehydration. Resolved with IV fluid hydration and discontinuation of hydrochlorothiazide.  Sinus bradycardia. She is asymptomatic. This may be secondary to calcium channel blocker. Her TSH and free T4 are within normal limits.  Normocytic anemia. Her anemia panel is indicative of mild iron deficiency. Also, it is likely that she has anemia of  chronic disease given her rheumatoid arthritis. We'll hold off on starting iron supplementation until after discharge..  Rheumatoid arthritis. She is treated chronically with methotrexate and Mobic.  Hypertension. She is being maintained on diltiazem and lisinopril, however hydrochlorothiazide was discontinued due to to hypercalcemia and dehydration.    Plan: Continue symptomatic treatment. Awaiting general surgery's formal evaluation.    LOS: 3 days   Stacy Meyers 12/16/2010, 11:07 AM

## 2010-12-16 NOTE — Consult Note (Signed)
Reason for Consult:Abdominal pain (RUQ) Referring Physician: [facility]  Stacy Meyers is an 77 y.o. female.  HPI: Patient presented with several day history of sharp sub sternal and epigastric abdominal pain. No relieving or exacerbating factors.  +Chills but no fevers.  Some nausea.  NO emesis.  No change in BM although did have one episode of constipation ~1-2 wks ago.  No continued effects.  NO melena, hematochezia.  No history of Juandice.  No significant family history for biliary disease.  1 pregnancy.  Questionable changes with fatty food intake.  Past Medical History  Diagnosis Date  . Essential hypertension, benign   . Mixed hyperlipidemia   . Rheumatoid arthritis   . Osteoarthritis   . Bradycardia     Sinus - noted 6/11  . Hypokalemia 12/14/2010  . HTN (hypertension) 12/14/2010  . ARF (acute renal failure) 12/14/2010  . Hypercalcemia 12/14/2010  . Anemia 12/14/2010  . Cholelithiasis 12/14/2010  . Collagen vascular disease   . Diastolic dysfunction 12/15/2010    Grade 1 per 2-D echocardiogram  . Acute cholecystitis 12/16/2010    Past Surgical History  Procedure Date  . Bilateral cataract surgery 2009  . L2-l3 discectomy 2002  . Right shoulder cyst excision 2010  . L3-l4, l4-l5 lumbar decompression 2011    Family History  Problem Relation Age of Onset  . Stroke Mother   . Cancer Father   . Diabetes Sister   . Diabetes Sister   . Kidney failure Brother   . Kidney failure Brother     Social History:  reports that she has never smoked. She has never used smokeless tobacco. She reports that she does not drink alcohol or use illicit drugs.  Allergies:  Allergies  Allergen Reactions  . Aspirin     REACTION: burns stomach (uncoated)    Medications:  I have reviewed the patient's current medications. Prior to Admission:  Prescriptions prior to admission  Medication Sig Dispense Refill  . acyclovir (ZOVIRAX) 400 MG tablet Take 1 tablet (400 mg total) by  mouth 2 (two) times daily.  180 tablet  0  . aspirin 81 MG tablet Take 81 mg by mouth daily.        Marland Kitchen diltiazem (CARDIZEM CD) 120 MG 24 hr capsule Take 1 capsule (120 mg total) by mouth daily.  90 capsule  0  . lisinopril-hydrochlorothiazide (PRINZIDE,ZESTORETIC) 20-25 MG per tablet TAKE 1 TABLET BY MOUTH ONCE A DAY  90 tablet  0  . meloxicam (MOBIC) 15 MG tablet TAKE 1 TABLET BY MOUTH ONCE A DAY  90 tablet  0  . methotrexate 2.5 MG tablet Take 6 tablets by mouth on Thursdays  72 tablet  1  . rosuvastatin (CRESTOR) 10 MG tablet Take 10 mg by mouth every other day.         Scheduled:   . acyclovir  400 mg Oral BID  . aspirin EC  81 mg Oral Daily  . diltiazem  120 mg Oral Daily  . lisinopril  20 mg Oral Daily  . pantoprazole  40 mg Oral Q1200  . piperacillin-tazobactam (ZOSYN)  IV  3.375 g Intravenous Q8H  . rosuvastatin  10 mg Oral Daily  . DISCONTD: acyclovir  400 mg Oral BID   Continuous:   . DISCONTD: 0.9 % NaCl with KCl 20 mEq / L 50 mL/hr at 12/15/10 1747   WJX:BJYN & mag hydroxide-simeth, fentaNYL, ondansetron (ZOFRAN) IV, ondansetron  Results for orders placed during the hospital encounter of 12/13/10 (from  the past 48 hour(s))  COMPREHENSIVE METABOLIC PANEL     Status: Abnormal   Collection Time   12/15/10  4:59 AM      Component Value Range Comment   Sodium 137  135 - 145 (mEq/L)    Potassium 3.9  3.5 - 5.1 (mEq/L) DELTA CHECK NOTED   Chloride 104  96 - 112 (mEq/L)    CO2 26  19 - 32 (mEq/L)    Glucose, Bld 130 (*) 70 - 99 (mg/dL)    BUN 15  6 - 23 (mg/dL)    Creatinine, Ser 1.19  0.50 - 1.10 (mg/dL)    Calcium 14.7  8.4 - 10.5 (mg/dL)    Total Protein 6.1  6.0 - 8.3 (g/dL)    Albumin 3.2 (*) 3.5 - 5.2 (g/dL)    AST 17  0 - 37 (U/L)    ALT 13  0 - 35 (U/L)    Alkaline Phosphatase 69  39 - 117 (U/L)    Total Bilirubin 0.5  0.3 - 1.2 (mg/dL)    GFR calc non Af Amer 58 (*) >90 (mL/min)    GFR calc Af Amer 67 (*) >90 (mL/min)   CBC     Status: Abnormal   Collection  Time   12/15/10  4:59 AM      Component Value Range Comment   WBC 11.4 (*) 4.0 - 10.5 (K/uL)    RBC 3.17 (*) 3.87 - 5.11 (MIL/uL)    Hemoglobin 10.4 (*) 12.0 - 15.0 (g/dL)    HCT 82.9 (*) 56.2 - 46.0 (%)    MCV 98.1  78.0 - 100.0 (fL)    MCH 32.8  26.0 - 34.0 (pg)    MCHC 33.4  30.0 - 36.0 (g/dL)    RDW 13.0  86.5 - 78.4 (%)    Platelets 207  150 - 400 (K/uL)   TSH     Status: Normal   Collection Time   12/15/10  4:59 AM      Component Value Range Comment   TSH 0.490  0.350 - 4.500 (uIU/mL)   T4, FREE     Status: Normal   Collection Time   12/15/10  4:59 AM      Component Value Range Comment   Free T4 0.97  0.80 - 1.80 (ng/dL)   FERRITIN     Status: Normal   Collection Time   12/15/10  4:59 AM      Component Value Range Comment   Ferritin 137  10 - 291 (ng/mL)   IRON AND TIBC     Status: Abnormal   Collection Time   12/15/10  4:59 AM      Component Value Range Comment   Iron 13 (*) 42 - 135 (ug/dL)    TIBC 696 (*) 295 - 470 (ug/dL)    Saturation Ratios 6 (*) 20 - 55 (%)    UIBC 214  125 - 400 (ug/dL)   VITAMIN M84     Status: Normal   Collection Time   12/15/10  4:59 AM      Component Value Range Comment   Vitamin B-12 318  211 - 911 (pg/mL)   URINALYSIS, ROUTINE W REFLEX MICROSCOPIC     Status: Abnormal   Collection Time   12/16/10  4:04 AM      Component Value Range Comment   Color, Urine YELLOW  YELLOW     APPearance CLEAR  CLEAR     Specific Gravity, Urine <1.005 (*) 1.005 -  1.030     pH 6.0  5.0 - 8.0     Glucose, UA NEGATIVE  NEGATIVE (mg/dL)    Hgb urine dipstick SMALL (*) NEGATIVE     Bilirubin Urine NEGATIVE  NEGATIVE     Ketones, ur NEGATIVE  NEGATIVE (mg/dL)    Protein, ur NEGATIVE  NEGATIVE (mg/dL)    Urobilinogen, UA 0.2  0.0 - 1.0 (mg/dL)    Nitrite NEGATIVE  NEGATIVE     Leukocytes, UA TRACE (*) NEGATIVE    URINE MICROSCOPIC-ADD ON     Status: Abnormal   Collection Time   12/16/10  4:04 AM      Component Value Range Comment   Squamous  Epithelial / LPF FEW (*) RARE     WBC, UA 3-6  <3 (WBC/hpf)    RBC / HPF 3-6  <3 (RBC/hpf)   BASIC METABOLIC PANEL     Status: Abnormal   Collection Time   12/16/10  5:29 AM      Component Value Range Comment   Sodium 138  135 - 145 (mEq/L)    Potassium 4.0  3.5 - 5.1 (mEq/L)    Chloride 104  96 - 112 (mEq/L)    CO2 27  19 - 32 (mEq/L)    Glucose, Bld 103 (*) 70 - 99 (mg/dL)    BUN 16  6 - 23 (mg/dL)    Creatinine, Ser 1.61 (*) 0.50 - 1.10 (mg/dL)    Calcium 09.6  8.4 - 10.5 (mg/dL)    GFR calc non Af Amer 42 (*) >90 (mL/min)    GFR calc Af Amer 49 (*) >90 (mL/min)   CBC     Status: Abnormal   Collection Time   12/16/10  5:29 AM      Component Value Range Comment   WBC 10.0  4.0 - 10.5 (K/uL)    RBC 2.90 (*) 3.87 - 5.11 (MIL/uL)    Hemoglobin 9.5 (*) 12.0 - 15.0 (g/dL)    HCT 04.5 (*) 40.9 - 46.0 (%)    MCV 98.3  78.0 - 100.0 (fL)    MCH 32.8  26.0 - 34.0 (pg)    MCHC 33.3  30.0 - 36.0 (g/dL)    RDW 81.1  91.4 - 78.2 (%)    Platelets 192  150 - 400 (K/uL)     Nm Hepatobiliary Liver Func  12/15/2010  *RADIOLOGY REPORT*  Clinical Data: Cholelithiasis, pain  NUCLEAR MEDICINE HEPATOHBILIARY INCLUDE GB  Radiopharmaceutical: 5.2 mCi Tc-34m mebrofenin  Comparison: None Correlation:  Ultrasound abdomen 12/14/2010  Findings: Normal hepatocellular function with prompt tracer excretion from bloodstream. Prompt excretion of tracer into the biliary tree. Small bowel visualized by 12 minutes. Gallbladder does not visualize by 1 hour. Patient unable to receive morphine, has experienced significant nausea and vomiting with morphine administrations in past 24 hours. Delayed images of the gallbladder were performed at 1.5 and 4 hours. No delayed visualization of the gallbladder is identified by 4 hours. Findings are compatible with cystic duct obstruction and acute cholecystitis.  IMPRESSION: Normal hepatocellular function. Nonvisualization of the gallbladder by 4 hours compatible with cystic duct  obstruction or acute cholecystitis.  Original Report Authenticated By: Lollie Marrow, M.D.    Review of Systems  Constitutional: Positive for chills. Negative for fever, weight loss, malaise/fatigue and diaphoresis.  HENT: Negative.   Eyes: Negative.   Respiratory: Negative.   Cardiovascular: Positive for chest pain. Negative for palpitations.  Gastrointestinal: Positive for nausea and abdominal pain (Epigastric). Negative for vomiting,  diarrhea, constipation, blood in stool and melena.  Genitourinary: Negative.   Skin: Negative.   Neurological: Negative.  Negative for weakness.  Endo/Heme/Allergies: Negative.   Psychiatric/Behavioral: Negative.    Blood pressure 107/56, pulse 56, temperature 98.7 F (37.1 C), temperature source Oral, resp. rate 18, height 5' 1.5" (1.562 m), weight 61.1 kg (134 lb 11.2 oz), SpO2 100.00%. Physical Exam  Constitutional: She is oriented to person, place, and time. She appears well-developed and well-nourished.       elderly  HENT:  Head: Normocephalic and atraumatic.  Eyes: Conjunctivae and EOM are normal. Pupils are equal, round, and reactive to light. No scleral icterus.  Neck: Normal range of motion. Neck supple. No thyromegaly present.  Cardiovascular: Normal rate, regular rhythm and normal heart sounds.   Respiratory: Effort normal and breath sounds normal. No respiratory distress. She has no wheezes.  GI: Soft. Bowel sounds are normal. She exhibits no distension and no mass. There is tenderness (mild to moderate RUQ tenderness.  Questionable Murphy's.  No diffuse peritoneal signs.). There is no rebound and no guarding.  Musculoskeletal: Normal range of motion.  Lymphadenopathy:    She has no cervical adenopathy.  Neurological: She is alert and oriented to person, place, and time.  Skin: Skin is warm and dry. No erythema.    Assessment/Plan: Acute cholecystitis.  Risks benefits and alternatives to surgical intervention were discussed with the  patient.  Continue diet as tolerated.  NPO at midnight for pre-op.  Continue coverage with Zosyn.  Continue DVT prophylaxis.  Will plan to proceed to OR in AM as discussed with patient and family.  Alessandra Sawdey C 12/16/2010, 9:39 PM

## 2010-12-17 ENCOUNTER — Encounter (HOSPITAL_COMMUNITY)
Admission: EM | Disposition: A | Discharge status: Home / Self Care | Payer: Self-pay | Source: Home / Self Care | Attending: Internal Medicine

## 2010-12-17 ENCOUNTER — Encounter (HOSPITAL_COMMUNITY): Payer: Self-pay | Admitting: Anesthesiology

## 2010-12-17 ENCOUNTER — Other Ambulatory Visit: Payer: Self-pay | Admitting: General Surgery

## 2010-12-17 ENCOUNTER — Observation Stay (HOSPITAL_COMMUNITY): Payer: Medicare Other | Admitting: Anesthesiology

## 2010-12-17 HISTORY — PX: CHOLECYSTECTOMY: SHX55

## 2010-12-17 LAB — URINE CULTURE

## 2010-12-17 LAB — DIFFERENTIAL
Basophils Absolute: 0 10*3/uL (ref 0.0–0.1)
Lymphocytes Relative: 10 % — ABNORMAL LOW (ref 12–46)
Monocytes Absolute: 0.8 10*3/uL (ref 0.1–1.0)
Neutro Abs: 6.7 10*3/uL (ref 1.7–7.7)

## 2010-12-17 LAB — CBC
HCT: 28.9 % — ABNORMAL LOW (ref 36.0–46.0)
Platelets: 195 10*3/uL (ref 150–400)
RBC: 2.94 MIL/uL — ABNORMAL LOW (ref 3.87–5.11)
RDW: 14.6 % (ref 11.5–15.5)
WBC: 8.4 10*3/uL (ref 4.0–10.5)

## 2010-12-17 LAB — BASIC METABOLIC PANEL
Chloride: 107 mEq/L (ref 96–112)
GFR calc non Af Amer: 47 mL/min — ABNORMAL LOW (ref >90)
Glucose, Bld: 97 mg/dL (ref 70–99)
Potassium: 3.5 mEq/L (ref 3.5–5.1)
Sodium: 140 mEq/L (ref 135–145)

## 2010-12-17 LAB — SURGICAL PCR SCREEN: Staphylococcus aureus: NEGATIVE

## 2010-12-17 SURGERY — LAPAROSCOPIC CHOLECYSTECTOMY
Anesthesia: General | Site: Abdomen | Wound class: Contaminated

## 2010-12-17 MED ORDER — BUPIVACAINE HCL (PF) 0.5 % IJ SOLN
INTRAMUSCULAR | Status: AC
  Filled 2010-12-17: qty 30

## 2010-12-17 MED ORDER — BUPIVACAINE HCL (PF) 0.5 % IJ SOLN
INTRAMUSCULAR | Status: DC | PRN
  Administered 2010-12-17: 10 mL

## 2010-12-17 MED ORDER — FENTANYL CITRATE 0.05 MG/ML IJ SOLN
INTRAMUSCULAR | Status: DC | PRN
  Administered 2010-12-17 (×3): 50 ug via INTRAVENOUS

## 2010-12-17 MED ORDER — ONDANSETRON HCL 4 MG/2ML IJ SOLN
INTRAMUSCULAR | Status: AC
  Administered 2010-12-17: 4 mg via INTRAVENOUS
  Filled 2010-12-17: qty 2

## 2010-12-17 MED ORDER — GLYCOPYRROLATE 0.2 MG/ML IJ SOLN
INTRAMUSCULAR | Status: DC | PRN
  Administered 2010-12-17: .4 mg via INTRAVENOUS

## 2010-12-17 MED ORDER — LIDOCAINE HCL (PF) 1 % IJ SOLN
INTRAMUSCULAR | Status: AC
  Filled 2010-12-17: qty 5

## 2010-12-17 MED ORDER — GLYCOPYRROLATE 0.2 MG/ML IJ SOLN
INTRAMUSCULAR | Status: AC
  Filled 2010-12-17: qty 2

## 2010-12-17 MED ORDER — ONDANSETRON HCL 4 MG/2ML IJ SOLN
4.0000 mg | Freq: Once | INTRAMUSCULAR | Status: AC
  Administered 2010-12-17: 4 mg via INTRAVENOUS

## 2010-12-17 MED ORDER — ENOXAPARIN SODIUM 40 MG/0.4ML ~~LOC~~ SOLN
40.0000 mg | SUBCUTANEOUS | Status: DC
  Administered 2010-12-18 – 2010-12-20 (×3): 40 mg via SUBCUTANEOUS
  Filled 2010-12-17 (×3): qty 0.4

## 2010-12-17 MED ORDER — ONDANSETRON HCL 4 MG/2ML IJ SOLN: 4.0000 mg | Freq: Once | INTRAMUSCULAR | Status: DC | PRN

## 2010-12-17 MED ORDER — MIDAZOLAM HCL 2 MG/2ML IJ SOLN
1.0000 mg | INTRAMUSCULAR | Status: DC | PRN
  Administered 2010-12-17: 2 mg via INTRAVENOUS

## 2010-12-17 MED ORDER — MIDAZOLAM HCL 2 MG/2ML IJ SOLN
INTRAMUSCULAR | Status: AC
  Administered 2010-12-17: 2 mg via INTRAVENOUS
  Filled 2010-12-17: qty 2

## 2010-12-17 MED ORDER — ENOXAPARIN SODIUM 40 MG/0.4ML ~~LOC~~ SOLN: 40.0000 mg | SUBCUTANEOUS | Status: DC

## 2010-12-17 MED ORDER — FENTANYL CITRATE 0.05 MG/ML IJ SOLN
25.0000 ug | INTRAMUSCULAR | Status: DC | PRN
  Administered 2010-12-17 (×2): 50 ug via INTRAVENOUS

## 2010-12-17 MED ORDER — SODIUM CHLORIDE 0.9 % IR SOLN
Status: DC | PRN
  Administered 2010-12-17: 1000 mL

## 2010-12-17 MED ORDER — LACTATED RINGERS IV SOLN
INTRAVENOUS | Status: DC
  Administered 2010-12-17 (×3): via INTRAVENOUS

## 2010-12-17 MED ORDER — PROPOFOL 10 MG/ML IV EMUL
INTRAVENOUS | Status: AC
  Filled 2010-12-17: qty 20

## 2010-12-17 MED ORDER — ROCURONIUM BROMIDE 100 MG/10ML IV SOLN
INTRAVENOUS | Status: DC | PRN
  Administered 2010-12-17: 25 mg via INTRAVENOUS

## 2010-12-17 MED ORDER — ROCURONIUM BROMIDE 50 MG/5ML IV SOLN
INTRAVENOUS | Status: AC
  Filled 2010-12-17: qty 1

## 2010-12-17 MED ORDER — MORPHINE SULFATE 2 MG/ML IJ SOLN
1.0000 mg | INTRAMUSCULAR | Status: DC | PRN
  Administered 2010-12-17 (×2): 2 mg via INTRAVENOUS
  Filled 2010-12-17 (×2): qty 1

## 2010-12-17 MED ORDER — FENTANYL CITRATE 0.05 MG/ML IJ SOLN
INTRAMUSCULAR | Status: AC
  Administered 2010-12-17: 50 ug via INTRAVENOUS
  Filled 2010-12-17: qty 2

## 2010-12-17 MED ORDER — ROSUVASTATIN CALCIUM 20 MG PO TABS
10.0000 mg | ORAL_TABLET | ORAL | Status: DC
  Administered 2010-12-19: 10 mg via ORAL
  Filled 2010-12-17: qty 1

## 2010-12-17 MED ORDER — NEOSTIGMINE METHYLSULFATE 1 MG/ML IJ SOLN
INTRAMUSCULAR | Status: DC | PRN
  Administered 2010-12-17: 2 mg via INTRAVENOUS

## 2010-12-17 MED ORDER — PROPOFOL 10 MG/ML IV BOLUS
INTRAVENOUS | Status: DC | PRN
  Administered 2010-12-17: 110 mg via INTRAVENOUS

## 2010-12-17 MED ORDER — ENOXAPARIN SODIUM 40 MG/0.4ML ~~LOC~~ SOLN
40.0000 mg | Freq: Once | SUBCUTANEOUS | Status: AC
  Administered 2010-12-17: 40 mg via SUBCUTANEOUS
  Filled 2010-12-17: qty 0.4

## 2010-12-17 MED ORDER — SODIUM CHLORIDE 0.9 % IR SOLN
Status: DC | PRN
  Administered 2010-12-17: 3000 mL

## 2010-12-17 MED ORDER — LIDOCAINE HCL 1 % IJ SOLN
INTRAMUSCULAR | Status: DC | PRN
  Administered 2010-12-17: 30 mg via INTRADERMAL

## 2010-12-17 MED ORDER — HEMOSTATIC AGENTS (NO CHARGE) OPTIME
TOPICAL | Status: DC | PRN
  Administered 2010-12-17: 1 via TOPICAL

## 2010-12-17 MED ORDER — LACTATED RINGERS IV SOLN: INTRAVENOUS | Status: DC

## 2010-12-17 MED ORDER — HYDROCODONE-ACETAMINOPHEN 5-325 MG PO TABS
1.0000 | ORAL_TABLET | ORAL | Status: DC | PRN
  Administered 2010-12-18: 2 via ORAL
  Administered 2010-12-18 – 2010-12-20 (×5): 1 via ORAL
  Filled 2010-12-17 (×2): qty 1
  Filled 2010-12-17: qty 2
  Filled 2010-12-17 (×3): qty 1

## 2010-12-17 MED ORDER — FENTANYL CITRATE 0.05 MG/ML IJ SOLN
INTRAMUSCULAR | Status: AC
  Administered 2010-12-17: 50 ug via INTRAVENOUS
  Filled 2010-12-17: qty 5

## 2010-12-17 SURGICAL SUPPLY — 48 items
APL SKNCLS STERI-STRIP NONHPOA (GAUZE/BANDAGES/DRESSINGS) ×1
APPLIER CLIP UNV 5X34 EPIX (ENDOMECHANICALS) ×2 IMPLANT
APR XCLPCLP 20M/L UNV 34X5 (ENDOMECHANICALS) ×1
BAG HAMPER (MISCELLANEOUS) ×2 IMPLANT
BAG SPEC RTRVL LRG 6X4 10 (ENDOMECHANICALS) ×1
BENZOIN TINCTURE PRP APPL 2/3 (GAUZE/BANDAGES/DRESSINGS) ×2 IMPLANT
CLOTH BEACON ORANGE TIMEOUT ST (SAFETY) ×2 IMPLANT
COVER LIGHT HANDLE STERIS (MISCELLANEOUS) ×4 IMPLANT
DECANTER SPIKE VIAL GLASS SM (MISCELLANEOUS) ×2 IMPLANT
DEVICE TROCAR PUNCTURE CLOSURE (ENDOMECHANICALS) ×2 IMPLANT
DURAPREP 26ML APPLICATOR (WOUND CARE) ×2 IMPLANT
ELECT REM PT RETURN 9FT ADLT (ELECTROSURGICAL) ×2
ELECTRODE REM PT RTRN 9FT ADLT (ELECTROSURGICAL) ×1 IMPLANT
EVACUATOR DRAINAGE 10X20 100CC (DRAIN) IMPLANT
EVACUATOR SILICONE 100CC (DRAIN) ×2
FILTER SMOKE EVAC LAPAROSHD (FILTER) ×2 IMPLANT
FORMALIN 10 PREFIL 120ML (MISCELLANEOUS) ×2 IMPLANT
GLOVE BIOGEL PI IND STRL 7.0 (GLOVE) IMPLANT
GLOVE BIOGEL PI IND STRL 7.5 (GLOVE) ×1 IMPLANT
GLOVE BIOGEL PI IND STRL 8.5 (GLOVE) IMPLANT
GLOVE BIOGEL PI INDICATOR 7.0 (GLOVE) ×1
GLOVE BIOGEL PI INDICATOR 7.5 (GLOVE) ×1
GLOVE BIOGEL PI INDICATOR 8.5 (GLOVE) ×1
GLOVE ECLIPSE 6.5 STRL STRAW (GLOVE) ×1 IMPLANT
GLOVE ECLIPSE 7.0 STRL STRAW (GLOVE) ×2 IMPLANT
GLOVE ECLIPSE 8.0 STRL XLNG CF (GLOVE) ×1 IMPLANT
GOWN BRE IMP SLV AUR XL STRL (GOWN DISPOSABLE) ×2 IMPLANT
GOWN PREVENTION PLUS XLARGE (GOWN DISPOSABLE) IMPLANT
GOWN STRL REIN 3XL LVL4 (GOWN DISPOSABLE) ×1 IMPLANT
GOWN STRL REIN XL XLG (GOWN DISPOSABLE) ×3 IMPLANT
HEMOSTAT SNOW SURGICEL 2X4 (HEMOSTASIS) ×2 IMPLANT
INST SET LAPROSCOPIC AP (KITS) ×2 IMPLANT
IV NS IRRIG 3000ML ARTHROMATIC (IV SOLUTION) ×2 IMPLANT
KIT ROOM TURNOVER APOR (KITS) ×2 IMPLANT
KIT TROCAR LAP CHOLE (TROCAR) ×2 IMPLANT
MANIFOLD NEPTUNE II (INSTRUMENTS) ×2 IMPLANT
PACK LAP CHOLE LZT030E (CUSTOM PROCEDURE TRAY) ×2 IMPLANT
PAD ARMBOARD 7.5X6 YLW CONV (MISCELLANEOUS) ×2 IMPLANT
POUCH SPECIMEN RETRIEVAL 10MM (ENDOMECHANICALS) ×2 IMPLANT
SET BASIN LINEN APH (SET/KITS/TRAYS/PACK) ×2 IMPLANT
SET TUBE IRRIG SUCTION NO TIP (IRRIGATION / IRRIGATOR) ×1 IMPLANT
SLEEVE Z-THREAD 5X100MM (TROCAR) ×2 IMPLANT
STRIP CLOSURE SKIN 1/2X4 (GAUZE/BANDAGES/DRESSINGS) ×2 IMPLANT
SUT ETHILON 3 0 FSL (SUTURE) ×1 IMPLANT
SUT MNCRL AB 4-0 PS2 18 (SUTURE) ×4 IMPLANT
SUT VIC AB 2-0 CT2 27 (SUTURE) ×4 IMPLANT
SYR 20CC LL (SYRINGE) ×1 IMPLANT
WARMER LAPAROSCOPE (MISCELLANEOUS) ×2 IMPLANT

## 2010-12-17 NOTE — Op Note (Signed)
Patient:  Stacy Meyers  DOB:  [birth year]  MRN:  [id number]   Preop Diagnosis:  Acute cholecystitis  Postop Diagnosis:  Hydrops of the gallbladder  Procedure:  Laparoscopic cholecystectomy with intra-abdominal drain placement  Surgeon:  Dr. Tilford Pillar  Anes:  General endotracheal  Indications:  Patient is a 77 year old female presented with right upper quadrant epigastric abdominal pain. Workup and evaluation was consistent for acute cholecystitis. Risks benefits alternatives of a laparoscopic possible open cholecystectomy were discussed at length the patient and family. Their questions and concerns were addressed patient was aware of risk of bleeding, infection, bile leak, small bowel and a, common bile duct injury, as well as intraoperative cardiac and pulmonary events. After obtaining consent patient was taken to the operating room for the planned procedure.  Procedure note:  Patient was taken to the OR was placed in supine position on the OR table which time the general anesthetic is a Optician, dispensing. Once patient was asleep she was endotracheally intubated by the nurse anesthetist. Her abdomen is then prepped and draped in usual fashion. A stab incision is created supraumbilically with 11 blade scalpel. Additional dissection down to subcuticular tissues carried using a Coker clamp was utilized to grasp the anterior wall fascia and lift this anteriorly. A Veress needle inserted saline drop test is utilized confirm intraperitoneal placement and then pneumoperitoneum was obtained. Once sufficient pneumoperitoneum was obtained an 11 mm trochars inserted over a laparoscope allowing visualization the trocar entering into the peritoneal cavity. At this time the inner cannulas removed the laparoscope was reinserted there is no is a trocar peritoneal placement injury. At this point the remaining trochars replaced with a 5 mm trocar in the epigastrium a 5 mm troponin midline between the other 2 midline  trochars. At one trochars placed in the right lateral bowel wall. At this point the patient's placed into a reverse Trendelenburg left lateral decubitus position. The omentum is intimately adherent to the fundus of the gallbladder. Upon bluntly stripped in the omentum off of the fundus of the gallbladder was noted the gallbladder is constant and thickened. A Weck needle is brought to the field was utilized to aspirate the gallbladder and contents. The returning aspirate was clear consistent with hydrops of the gallbladder. Upon decompression of the gallbladder the fundus is grasped and lifted up and over the right lobe the liver. Blunt dissection was carried out using the tip of the suction irrigator to dissect the medial and free of the overlying peritoneum. At this point the cystic duct is identified entering into the infundibulum. A window was created behind the cystic duct. 3 endoclips placed proximally one distally and the cystic duct was divided between 2 most distal clips. Similarly the cystic artery is identified with 2 endoclips placed proximally one distally and the cystic artery divided between 2 most is a clips. At this point I began dissection of the gallbladder free from the gallbladder fossa. This dissection was used due to the amount and inflammation and the limited to the electrocautery. Hemostasis was obtained during this dissection with electrocautery off the surface the gallbladder fossa remained raw. Upon freeing the gallbladder is placed into an Endo Catch bag having exchanged the 10 mm scope for a 5 mm scope. The Endo Catch bag was placed into the right lower quadrant and the laparoscopic was again exchanged for a 10 mm scope. At this point I copiously irrigated the surgical field. Inspection the gallbladder fossa and indicated it was extremely Molly Maduro using along the  edges of the gallbladder fossa. This was slowed with electrocautery by also opted to place a piece of Surgicel snow into the  gallbladder fossa to assist with hemostasis. The endoclips were inspected there is no evidence of bleeding or bile leak. At this time I turned my attention to placement of the JP drain.  A 10 flat JP drain was advanced to the epigastric trocar site and was retrieved through the right lateral trocar site. The drain was placed adjacent to the gallbladder fossa and up along the right colon gutter. At this point attention was turned to closure. The gallbladder was retrieved was removed through the umbilical trocar site and intact Endo Catch bag. The gallbladder was placed in the back table was sent as a permanent specimen to pathology. Additionally at this point the pneumoperitoneum was evacuated the trochars removed, a 2-0 Vicryl suture was utilized to reapproximate the fascia at the umbilical trocar site. Local anesthetic was instilled. A 4-0 Monocryl utilized to reapproximate this incisions 3 midline incisions. A 3-0 nylon was utilized to secure the drain to the skin at the right lateral trocar site. At this point the skin was washed and dried a moistened dry towel. Benzoin is applied around the midline incisions. Half-inch Steri-Strips are placed in the midline incisions. A drain sponge was placed around the drain on the right lateral trocar site. At this point the drapes removed the patient was allowed to come out of general static is transferred to the postanesthetic care unit in stable condition. At the conclusion of the procedure all instrument sponge and needle counts are correct. Patient tolerated seizure well.  Complications:  None apparent  EBL:  350 ML  Specimen:  Gallbladder and large stone

## 2010-12-17 NOTE — Anesthesia Procedure Notes (Signed)
Procedure Name: Intubation Date/Time: 12/17/2010 10:54 AM Performed by: Glynn Octave Pre-anesthesia Checklist: Patient identified, Patient being monitored, Timeout performed, Emergency Drugs available and Suction available Patient Re-evaluated:Patient Re-evaluated prior to inductionOxygen Delivery Method: Circle System Utilized Preoxygenation: Pre-oxygenation with 100% oxygen Intubation Type: IV induction Ventilation: Mask ventilation without difficulty Laryngoscope Size: Mac and 3 Grade View: Grade II Tube type: Oral Tube size: 7.0 mm Number of attempts: 1 Airway Equipment and Method: stylet Placement Confirmation: ETT inserted through vocal cords under direct vision,  positive ETCO2 and breath sounds checked- equal and bilateral Secured at: 20 cm Tube secured with: Tape Dental Injury: Teeth and Oropharynx as per pre-operative assessment

## 2010-12-17 NOTE — Progress Notes (Signed)
UR Chart Review Completed  

## 2010-12-17 NOTE — Anesthesia Postprocedure Evaluation (Signed)
  Anesthesia Post-op Note  Patient: Stacy Meyers  Procedure(s) Performed:  LAPAROSCOPIC CHOLECYSTECTOMY  Patient Location: PACU  Anesthesia Type: General  Level of Consciousness: awake, alert  and oriented  Airway and Oxygen Therapy: Patient Spontanous Breathing and Patient connected to face mask oxygen  Post-op Pain: none  Post-op Assessment: Post-op Vital signs reviewed, Patient's Cardiovascular Status Stable, Respiratory Function Stable and No signs of Nausea or vomiting  Post-op Vital Signs: Reviewed and stable  Complications: No apparent anesthesia complications

## 2010-12-17 NOTE — Transfer of Care (Signed)
Immediate Anesthesia Transfer of Care Note  Patient: Stacy Meyers  Procedure(s) Performed:  LAPAROSCOPIC CHOLECYSTECTOMY  Patient Location: PACU  Anesthesia Type: General  Level of Consciousness: awake, alert  and oriented  Airway & Oxygen Therapy: Patient Spontanous Breathing  Post-op Assessment: Report given to PACU RN  Post vital signs: Reviewed and stable  Complications: No apparent anesthesia complications

## 2010-12-17 NOTE — Progress Notes (Signed)
Day of Surgery  Subjective: Patient's symptomatology feels somewhat better. No pain. No nausea or vomiting.  Objective: Vital signs in last 24 hours: Temp:  [98.5 F (36.9 C)-99.2 F (37.3 C)] 98.5 F (36.9 C) (12/12 0948) Pulse Rate:  [56-68] 62  (12/12 0948) Resp:  [18-28] 27  (12/12 1010) BP: (107-151)/(56-76) 151/71 mmHg (12/12 1010) SpO2:  [96 %-100 %] 97 % (12/12 1010) Last BM Date: 12/17/10  Intake/Output from previous day: 12/11 0701 - 12/12 0700 In: 7771.3 [P.O.:600; I.V.:6921.3; IV Piggyback:250] Out: 300 [Urine:300] Intake/Output this shift:    General appearance: alert and no distress Resp: clear to auscultation bilaterally Cardio: regular rate and rhythm GI: Positive bowel sounds, soft, flat, thin, mild to moderate right upper quadrant abdominal pain. No classic Eulah Pont sign is elicited. No diffuse peritoneal signs.  Lab Results:   [facility] 12/17/10 0530 12/16/10 0529  WBC 8.4 10.0  HGB 9.7* 9.5*  HCT 28.9* 28.5*  PLT 195 192   BMET  Basename 12/17/10 0530 12/16/10 0529  NA 140 138  K 3.5 4.0  CL 107 104  CO2 27 27  GLUCOSE 97 103*  BUN 11 16  CREATININE 1.11* 1.22*  CALCIUM 9.6 10.0   PT/INR No results found for this basename: LABPROT:2,INR:2 in the last 72 hours ABG No results found for this basename: PHART:2,PCO2:2,PO2:2,HCO3:2 in the last 72 hours  Studies/Results: Nm Hepatobiliary Liver Func  12/15/2010  *RADIOLOGY REPORT*  Clinical Data: Cholelithiasis, pain  NUCLEAR MEDICINE HEPATOHBILIARY INCLUDE GB  Radiopharmaceutical: 5.2 mCi Tc-60m mebrofenin  Comparison: None Correlation:  Ultrasound abdomen 12/14/2010  Findings: Normal hepatocellular function with prompt tracer excretion from bloodstream. Prompt excretion of tracer into the biliary tree. Small bowel visualized by 12 minutes. Gallbladder does not visualize by 1 hour. Patient unable to receive morphine, has experienced significant nausea and vomiting with morphine administrations in past  24 hours. Delayed images of the gallbladder were performed at 1.5 and 4 hours. No delayed visualization of the gallbladder is identified by 4 hours. Findings are compatible with cystic duct obstruction and acute cholecystitis.  IMPRESSION: Normal hepatocellular function. Nonvisualization of the gallbladder by 4 hours compatible with cystic duct obstruction or acute cholecystitis.  Original Report Authenticated By: Lollie Marrow, M.D.    Anti-infectives: Anti-infectives     Start     Dose/Rate Route Frequency Ordered Stop   12/16/10 1000   acyclovir (ZOVIRAX) 200 MG capsule 400 mg        400 mg Oral 2 times daily 12/16/10 0807     12/15/10 2200   acyclovir (ZOVIRAX) tablet 400 mg  Status:  Discontinued        400 mg Oral 2 times daily 12/15/10 1746 12/16/10 0808   12/15/10 1900   piperacillin-tazobactam (ZOSYN) IVPB 3.375 g        3.375 g 12.5 mL/hr over 240 Minutes Intravenous Every 8 hours 12/15/10 1812            Assessment/Plan: s/p Procedure(s): LAPAROSCOPIC CHOLECYSTECTOMY Acute cholecystitis. Risks benefits alternatives of a laparoscopic possible open cholecystectomy were again discussed with the patient and family including but not limited to risk of bleeding, infection, bile leak, small bowel injury, common bile duct injury, as well as intraoperative cardiac and pulmonary events. Her questions and concerns are addressed. Patient is still on IV antibiotics. Still on DVT prophylaxis. Will plan to proceed to the operating room as discussed for a laparoscopic possible open cholecystectomy. Patient's questions and concerns as well as family's questions and concerns are  addressed the fullest.  LOS: 4 days    Camrie Stock C 12/17/2010

## 2010-12-17 NOTE — Anesthesia Preprocedure Evaluation (Addendum)
Anesthesia Evaluation  Patient identified by MRN, date of birth, ID band Patient awake    Reviewed: Allergy & Precautions, H&P , NPO status , Patient's Chart, lab work & pertinent test results  History of Anesthesia Complications Negative for: history of anesthetic complications  Airway Mallampati: II      Dental  (+) Teeth Intact   Pulmonary neg pulmonary ROS,    Pulmonary exam normal       Cardiovascular hypertension, Pt. on medications + dysrhythmias (hx irreg, not evident now.) Regular Normal    Neuro/Psych    GI/Hepatic   Endo/Other    Renal/GU      Musculoskeletal  (+) Arthritis -, Osteoarthritis,    Abdominal   Peds  Hematology   Anesthesia Other Findings   Reproductive/Obstetrics                           Anesthesia Physical Anesthesia Plan  ASA: II  Anesthesia Plan: General   Post-op Pain Management:    Induction: Intravenous  Airway Management Planned: Oral ETT  Additional Equipment:   Intra-op Plan:   Post-operative Plan: Extubation in OR  Informed Consent: I have reviewed the patients History and Physical, chart, labs and discussed the procedure including the risks, benefits and alternatives for the proposed anesthesia with the patient or authorized representative who has indicated his/her understanding and acceptance.     Plan Discussed with:   Anesthesia Plan Comments:         Anesthesia Quick Evaluation

## 2010-12-17 NOTE — Progress Notes (Signed)
Subjective: Patient is seen just before being taken down to the OR. She says that she had a good night. She is still having some epigastric discomfort but good results with analgesics.  Objective: Vital signs in last 24 hours: Filed Vitals:   12/17/10 1020 12/17/10 1025 12/17/10 1030 12/17/10 1035  BP: 145/68 145/70 142/65 149/63  Pulse:      Temp:      TempSrc:      Resp: 28 23 26 25   Height:      Weight:      SpO2: 96% 93% 93% 93%    Intake/Output Summary (Last 24 hours) at 12/17/10 1152 Last data filed at 12/17/10 1148  Gross per 24 hour  Intake 7951.33 ml  Output    400 ml  Net 7551.33 ml    Weight change:   Exam: General: The patient is a pleasant elderly after American woman who is currently laying in bed in no acute distress. Lungs: Decreased breath sounds in the bases otherwise clear. Heart: S1, S2, with a soft systolic murmur. Abdomen: Positive bowel sounds, soft, mildly tender in the epigastrium, no rebound, no guarding, no masses. Extremities: No pedal edema. Back: Mild tenderness around further thoracic- lumbar muscles without spasm.  Lab Results: Basic Metabolic Panel:  Basename 12/17/10 0530 12/16/10 0529  NA 140 138  K 3.5 4.0  CL 107 104  CO2 27 27  GLUCOSE 97 103*  BUN 11 16  CREATININE 1.11* 1.22*  CALCIUM 9.6 10.0  MG -- --  PHOS -- --   Liver Function Tests:  Basename 12/15/10 0459  AST 17  ALT 13  ALKPHOS 69  BILITOT 0.5  PROT 6.1  ALBUMIN 3.2*   No results found for this basename: LIPASE:2,AMYLASE:2 in the last 72 hours No results found for this basename: AMMONIA:2 in the last 72 hours CBC:  Basename 12/17/10 0530 12/16/10 0529  WBC 8.4 10.0  NEUTROABS 6.7 --  HGB 9.7* 9.5*  HCT 28.9* 28.5*  MCV 98.3 98.3  PLT 195 192   Cardiac Enzymes:  Basename 12/14/10 1543  CKTOTAL 98  CKMB 2.0  CKMBINDEX --  TROPONINI <0.30   BNP: No components found with this basename: POCBNP:3 D-Dimer: No results found for this basename:  DDIMER:2 in the last 72 hours CBG: No results found for this basename: GLUCAP:6 in the last 72 hours Hemoglobin A1C: No results found for this basename: HGBA1C in the last 72 hours Fasting Lipid Panel: No results found for this basename: CHOL,HDL,LDLCALC,TRIG,CHOLHDL,LDLDIRECT in the last 72 hours Thyroid Function Tests:  Basename 12/15/10 0459  TSH 0.490  T4TOTAL --  FREET4 0.97  T3FREE --  THYROIDAB --   Anemia Panel:  Basename 12/15/10 0459  VITAMINB12 318  FOLATE --  FERRITIN 137  TIBC 227*  IRON 13*  RETICCTPCT --   Coagulation: No results found for this basename: LABPROT:2,INR:2 in the last 72 hours Urine Drug Screen: Drugs of Abuse  No results found for this basename: labopia,  cocainscrnur,  labbenz,  amphetmu,  thcu,  labbarb    Alcohol Level: No results found for this basename: ETH:2 in the last 72 hours  Micro: Recent Results (from the past 240 hour(s))  SURGICAL PCR SCREEN     Status: Normal   Collection Time   12/17/10 12:06 AM      Component Value Range Status Comment   MRSA, PCR NEGATIVE  NEGATIVE  Final    Staphylococcus aureus NEGATIVE  NEGATIVE  Final     Studies/Results:  Nm Hepatobiliary Liver Func  12/15/2010  *RADIOLOGY REPORT*  Clinical Data: Cholelithiasis, pain  NUCLEAR MEDICINE HEPATOHBILIARY INCLUDE GB  Radiopharmaceutical: 5.2 mCi Tc-30m mebrofenin  Comparison: None Correlation:  Ultrasound abdomen 12/14/2010  Findings: Normal hepatocellular function with prompt tracer excretion from bloodstream. Prompt excretion of tracer into the biliary tree. Small bowel visualized by 12 minutes. Gallbladder does not visualize by 1 hour. Patient unable to receive morphine, has experienced significant nausea and vomiting with morphine administrations in past 24 hours. Delayed images of the gallbladder were performed at 1.5 and 4 hours. No delayed visualization of the gallbladder is identified by 4 hours. Findings are compatible with cystic duct obstruction  and acute cholecystitis.  IMPRESSION: Normal hepatocellular function. Nonvisualization of the gallbladder by 4 hours compatible with cystic duct obstruction or acute cholecystitis.  Original Report Authenticated By: Lollie Marrow, M.D.    Medications: I have reviewed the patient's current medications.  Assessment: Principal Problem:  *Chest pain Active Problems:  SINUS BRADYCARDIA  Rheumatoid arthritis  Hypokalemia  HTN (hypertension)  ARF (acute renal failure)  Hypercalcemia  Anemia  Cholelithiasis  Diastolic dysfunction  Pyuria  Acute cholecystitis   1.Chest pain. Myocardial infarction ruled out. CT angiogram of the chest was negative for PE. Her 2-D echocardiogram revealed preserved LV function and mild grade 1 diastolic dysfunction. Protonix was added empirically. It is likely that her chest pain was a consequence of acute cholecystitis.  Cholelithiasis with a large gallstone in the gallbladder neck and acute cholecystitis per HIDA scan and clinically. She will undergo cholecystectomy today. She is on Zosyn empirically.  Acute renal failure, secondary to prerenal azotemia. Resolving with IV fluids.  Hypokalemia. Resolved with repletion.  Hypercalcemia. Likely secondary to dehydration. Resolved with IV fluid hydration and discontinuation of hydrochlorothiazide.  Sinus bradycardia. She is asymptomatic. This may be secondary to calcium channel blocker. Her TSH and free T4 are within normal limits.  Normocytic anemia. Her anemia panel is indicative of mild iron deficiency. Also, it is likely that she has anemia of chronic disease given her rheumatoid arthritis. We'll hold off on starting iron supplementation until after discharge..  Rheumatoid arthritis. She is treated chronically with methotrexate and Mobic.  Hypertension. She is being maintained on diltiazem and lisinopril, however hydrochlorothiazide was discontinued due to to hypercalcemia and  dehydration.    Plan: Continue symptomatic treatment. Cholecystectomy today.    LOS: 4 days   Shalandra Leu 12/17/2010, 11:52 AM

## 2010-12-18 ENCOUNTER — Encounter (HOSPITAL_COMMUNITY): Payer: Self-pay | Admitting: Anesthesiology

## 2010-12-18 LAB — CBC
HCT: 24.9 % — ABNORMAL LOW (ref 36.0–46.0)
Hemoglobin: 8.3 g/dL — ABNORMAL LOW (ref 12.0–15.0)
RBC: 2.55 MIL/uL — ABNORMAL LOW (ref 3.87–5.11)
WBC: 7 10*3/uL (ref 4.0–10.5)

## 2010-12-18 LAB — BASIC METABOLIC PANEL
BUN: 11 mg/dL (ref 6–23)
CO2: 26 mEq/L (ref 19–32)
Chloride: 105 mEq/L (ref 96–112)
GFR calc non Af Amer: 45 mL/min — ABNORMAL LOW (ref >90)
Glucose, Bld: 88 mg/dL (ref 70–99)
Potassium: 3.5 mEq/L (ref 3.5–5.1)
Sodium: 138 mEq/L (ref 135–145)

## 2010-12-18 LAB — HEMOGLOBIN AND HEMATOCRIT, BLOOD: HCT: 23.6 % — ABNORMAL LOW (ref 36.0–46.0)

## 2010-12-18 NOTE — Progress Notes (Signed)
1 Day Post-Op  Subjective: Comfortable.  NO nausea.  No fevers or chills.  Pain well controlled.  Ambulating.  Some lightheadedness.  No CP or SOB.  Objective: Vital signs in last 24 hours: Temp:  [97.7 F (36.5 C)-98.7 F (37.1 C)] 98.7 F (37.1 C) (12/13 1004) Pulse Rate:  [63-71] 67  (12/13 1004) Resp:  [16-20] 20  (12/13 1004) BP: (105-132)/(61-76) 106/61 mmHg (12/13 1004) SpO2:  [97 %-100 %] 98 % (12/13 1004) Last BM Date: 12/17/10  Intake/Output from previous day: 12/12 0701 - 12/13 0700 In: 1030 [I.V.:1000; IV Piggyback:30] Out: 1690 [Urine:850; Drains:540; Blood:300] Intake/Output this shift: Total I/O In: -  Out: 450 [Urine:450]  General appearance: alert and no distress Resp: clear to auscultation bilaterally Cardio: regular rate and rhythm GI: +bs, soft, flat, mild expected tenderness.  NO peritoneal signs.  JP serosang.  Lab Results:   Basename 12/18/10 0504 12/17/10 0530  WBC 7.0 8.4  HGB 8.3* 9.7*  HCT 24.9* 28.9*  PLT 184 195   BMET  Basename 12/18/10 0504 12/17/10 0530  NA 138 140  K 3.5 3.5  CL 105 107  CO2 26 27  GLUCOSE 88 97  BUN 11 11  CREATININE 1.15* 1.11*  CALCIUM 9.1 9.6   PT/INR No results found for this basename: LABPROT:2,INR:2 in the last 72 hours ABG No results found for this basename: PHART:2,PCO2:2,PO2:2,HCO3:2 in the last 72 hours  Studies/Results: No results found.  Anti-infectives: Anti-infectives     Start     Dose/Rate Route Frequency Ordered Stop   12/16/10 1000   acyclovir (ZOVIRAX) 200 MG capsule 400 mg        400 mg Oral 2 times daily 12/16/10 0807     12/15/10 2200   acyclovir (ZOVIRAX) tablet 400 mg  Status:  Discontinued        400 mg Oral 2 times daily 12/15/10 1746 12/16/10 0808   12/15/10 1900   piperacillin-tazobactam (ZOSYN) IVPB 3.375 g        3.375 g 12.5 mL/hr over 240 Minutes Intravenous Every 8 hours 12/15/10 1812            Assessment/Plan: s/p Procedure(s): LAPAROSCOPIC  CHOLECYSTECTOMY Continue to advance diet.  Acute blood loss anemia likely compounded by continued fluid replacement, will re-check H&H for this afternoon.  If less than 8 will transfuse as discussed.  Continue activity as tolerated.  Likely d/c over the weekend if stable.  LOS: 5 days    Stacy Meyers C 12/18/2010

## 2010-12-18 NOTE — Anesthesia Postprocedure Evaluation (Signed)
  Anesthesia Post-op Note  Patient: Stacy Meyers  Procedure(s) Performed:  LAPAROSCOPIC CHOLECYSTECTOMY  Patient Location: room 332  Anesthesia Type: General  Level of Consciousness: awake, alert , oriented and patient cooperative  Airway and Oxygen Therapy: Patient Spontanous Breathing  Post-op Pain: none  Post-op Assessment: Post-op Vital signs reviewed, Patient's Cardiovascular Status Stable, Respiratory Function Stable, Patent Airway, No signs of Nausea or vomiting, Adequate PO intake and Pain level controlled  Post-op Vital Signs: Reviewed and stable  Complications: No apparent anesthesia complications

## 2010-12-18 NOTE — Consult Note (Signed)
ANTIBIOTIC CONSULT NOTE  Pharmacy Consult for Zosyn Indication: empiric ABX Rx  Allergies  Allergen Reactions  . Aspirin     REACTION: burns stomach (uncoated)   Patient Measurements: Height: 5' 1.5" (156.2 cm) Weight: 134 lb 11.2 oz (61.1 kg) IBW/kg (Calculated) : 48.95   Vital Signs: Temp: 98.1 F (36.7 C) (12/13 0551) Temp src: Oral (12/13 0551) BP: 127/75 mmHg (12/13 0551) Pulse Rate: 71  (12/13 0551) Intake/Output from previous day: 12/12 0701 - 12/13 0700 In: 1030 [I.V.:1000; IV Piggyback:30] Out: 1690 [Urine:850; Drains:540; Blood:300] Intake/Output from this shift:    Labs:  [facility] 12/18/10 0504 12/17/10 0530 12/16/10 0529  WBC 7.0 8.4 10.0  HGB 8.3* 9.7* 9.5*  PLT 184 195 192  LABCREA -- -- --  CREATININE 1.15* 1.11* 1.22*   Estimated Creatinine Clearance: 35.3 ml/min (by C-G formula based on Cr of 1.15). No results found for this basename: VANCOTROUGH:2,VANCOPEAK:2,VANCORANDOM:2,GENTTROUGH:2,GENTPEAK:2,GENTRANDOM:2,TOBRATROUGH:2,TOBRAPEAK:2,TOBRARND:2,AMIKACINPEAK:2,AMIKACINTROU:2,AMIKACIN:2, in the last 72 hours   Microbiology: Recent Results (from the past 720 hour(s))  URINE CULTURE     Status: Normal   Collection Time   12/16/10  4:04 AM      Component Value Range Status Comment   Specimen Description URINE, CLEAN CATCH   Final    Special Requests NONE   Final    Setup Time [id number]   Final    Colony Count NO GROWTH   Final    Culture NO GROWTH   Final    Report Status 12/17/2010 FINAL   Final   SURGICAL PCR SCREEN     Status: Normal   Collection Time   12/17/10 12:06 AM      Component Value Range Status Comment   MRSA, PCR NEGATIVE  NEGATIVE  Final    Staphylococcus aureus NEGATIVE  NEGATIVE  Final     Medical History: Past Medical History  Diagnosis Date  . Essential hypertension, benign   . Mixed hyperlipidemia   . Rheumatoid arthritis   . Osteoarthritis   . Bradycardia     Sinus - noted 6/11  . Hypokalemia 12/14/2010  . HTN  (hypertension) 12/14/2010  . ARF (acute renal failure) 12/14/2010  . Hypercalcemia 12/14/2010  . Anemia 12/14/2010  . Cholelithiasis 12/14/2010  . Collagen vascular disease   . Diastolic dysfunction 12/15/2010    Grade 1 per 2-D echocardiogram  . Acute cholecystitis 12/16/2010   Medications:  Scheduled:     . acyclovir  400 mg Oral BID  . aspirin EC  81 mg Oral Daily  . diltiazem  120 mg Oral Daily  . enoxaparin  40 mg Subcutaneous Q24H  . glycopyrrolate      . lidocaine      . lisinopril  20 mg Oral Daily  . ondansetron (ZOFRAN) IV  4 mg Intravenous Once  . pantoprazole  40 mg Oral Q1200  . piperacillin-tazobactam (ZOSYN)  IV  3.375 g Intravenous Q8H  . propofol      . rocuronium      . rosuvastatin  10 mg Oral Daily  . rosuvastatin  10 mg Oral QODAY  . DISCONTD: enoxaparin  40 mg Subcutaneous Q24H   Assessment: Estimated Creatinine Clearance: 35.3 ml/min (by C-G formula based on Cr of 1.15). S/P Laparoscopic cholecystectomy with intra-abdominal drain placement on 12/17/2010.  Goal of Therapy:  Eradicate infection.  Plan: Zosyn 3.375gm iv q8hrs Monitor labs per protocol  Mady Gemma 12/18/2010,9:54 AM

## 2010-12-18 NOTE — Progress Notes (Signed)
Subjective: Tolerating solid food.  Objective: Vital signs in last 24 hours: Filed Vitals:   12/18/10 0206 12/18/10 0551 12/18/10 1004 12/18/10 1430  BP: 116/72 127/75 106/61 103/62  Pulse: 67 71 67 62  Temp: 98 F (36.7 C) 98.1 F (36.7 C) 98.7 F (37.1 C) 98.5 F (36.9 C)  TempSrc: Oral Oral    Resp: 20 16 20 20   Height:      Weight:      SpO2: 99% 97% 98% 95%    Intake/Output Summary (Last 24 hours) at 12/18/10 1807 Last data filed at 12/18/10 1803  Gross per 24 hour  Intake     50 ml  Output   1105 ml  Net  -1055 ml    Weight change:   Walking around with nurse tech  Lab Results: Basic Metabolic Panel:  Basename 12/18/10 0504 12/17/10 0530  NA 138 140  K 3.5 3.5  CL 105 107  CO2 26 27  GLUCOSE 88 97  BUN 11 11  CREATININE 1.15* 1.11*  CALCIUM 9.1 9.6  MG -- --  PHOS -- --   Liver Function Tests: No results found for this basename: AST:2,ALT:2,ALKPHOS:2,BILITOT:2,PROT:2,ALBUMIN:2 in the last 72 hours No results found for this basename: LIPASE:2,AMYLASE:2 in the last 72 hours No results found for this basename: AMMONIA:2 in the last 72 hours CBC:  Basename 12/18/10 1622 12/18/10 0504 12/17/10 0530  WBC -- 7.0 8.4  NEUTROABS -- -- 6.7  HGB 7.9* 8.3* --  HCT 23.6* 24.9* --  MCV -- 97.6 98.3  PLT -- 184 195   Cardiac Enzymes: No results found for this basename: CKTOTAL:3,CKMB:3,CKMBINDEX:3,TROPONINI:3 in the last 72 hours BNP: No components found with this basename: POCBNP:3 D-Dimer: No results found for this basename: DDIMER:2 in the last 72 hours CBG: No results found for this basename: GLUCAP:6 in the last 72 hours Hemoglobin A1C: No results found for this basename: HGBA1C in the last 72 hours Fasting Lipid Panel: No results found for this basename: CHOL,HDL,LDLCALC,TRIG,CHOLHDL,LDLDIRECT in the last 72 hours Thyroid Function Tests: No results found for this basename: TSH,T4TOTAL,FREET4,T3FREE,THYROIDAB in the last 72 hours Anemia  Panel: No results found for this basename: VITAMINB12,FOLATE,FERRITIN,TIBC,IRON,RETICCTPCT in the last 72 hours Coagulation: No results found for this basename: LABPROT:2,INR:2 in the last 72 hours Urine Drug Screen: Drugs of Abuse  No results found for this basename: labopia,  cocainscrnur,  labbenz,  amphetmu,  thcu,  labbarb    Alcohol Level: No results found for this basename: ETH:2 in the last 72 hours  Micro: Recent Results (from the past 240 hour(s))  URINE CULTURE     Status: Normal   Collection Time   12/16/10  4:04 AM      Component Value Range Status Comment   Specimen Description URINE, CLEAN CATCH   Final    Special Requests NONE   Final    Setup Time [id number]   Final    Colony Count NO GROWTH   Final    Culture NO GROWTH   Final    Report Status 12/17/2010 FINAL   Final   SURGICAL PCR SCREEN     Status: Normal   Collection Time   12/17/10 12:06 AM      Component Value Range Status Comment   MRSA, PCR NEGATIVE  NEGATIVE  Final    Staphylococcus aureus NEGATIVE  NEGATIVE  Final     Assessment:  Doing well post cholecystectomy. Management per Dr. Dian Situ. Medical issues stable.   LOS: 5 days  Stacy Meyers L 12/18/2010, 6:07 PM

## 2010-12-19 LAB — BASIC METABOLIC PANEL
BUN: 10 mg/dL (ref 6–23)
CO2: 27 mEq/L (ref 19–32)
Chloride: 103 mEq/L (ref 96–112)
GFR calc Af Amer: 55 mL/min — ABNORMAL LOW (ref >90)
Potassium: 3.5 mEq/L (ref 3.5–5.1)

## 2010-12-19 LAB — CBC
HCT: 32.1 % — ABNORMAL LOW (ref 36.0–46.0)
Hemoglobin: 10.9 g/dL — ABNORMAL LOW (ref 12.0–15.0)
MCH: 31.8 pg (ref 26.0–34.0)
MCHC: 34 g/dL (ref 30.0–36.0)
MCV: 93.6 fL (ref 78.0–100.0)
Platelets: 211 K/uL (ref 150–400)
RBC: 3.43 MIL/uL — ABNORMAL LOW (ref 3.87–5.11)
RDW: 15.7 % — ABNORMAL HIGH (ref 11.5–15.5)
WBC: 6.9 K/uL (ref 4.0–10.5)

## 2010-12-19 MED ORDER — SODIUM CHLORIDE 0.9 % IJ SOLN
3.0000 mL | Freq: Two times a day (BID) | INTRAMUSCULAR | Status: DC
  Administered 2010-12-19 – 2010-12-20 (×3): 3 mL via INTRAVENOUS
  Filled 2010-12-19: qty 3
  Filled 2010-12-19: qty 6

## 2010-12-19 MED ORDER — SODIUM CHLORIDE 0.9 % IJ SOLN
3.0000 mL | INTRAMUSCULAR | Status: DC | PRN
  Administered 2010-12-19 – 2010-12-20 (×4): 3 mL via INTRAVENOUS

## 2010-12-19 NOTE — Progress Notes (Signed)
Subjective: Tolerating solid food.  Objective: Vital signs in last 24 hours: Filed Vitals:   12/19/10 0547 12/19/10 0657 12/19/10 0945 12/19/10 1411  BP: 133/74 122/71 112/69 133/71  Pulse: 68 71 64 61  Temp: 98.1 F (36.7 C) 97.9 F (36.6 C) 98.1 F (36.7 C) 97.6 F (36.4 C)  TempSrc: Oral Oral Oral Oral  Resp: 20 20 18 18   Height:      Weight:      SpO2:  98%  94%    Intake/Output Summary (Last 24 hours) at 12/19/10 1629 Last data filed at 12/19/10 1400  Gross per 24 hour  Intake   1613 ml  Output    605 ml  Net   1008 ml    Weight change:   Comfortable Lungs: CTA w/o WRR CV RRR w/o MGR Abd: S, NT, ND Ext: no CCE  Lab Results: Basic Metabolic Panel:  Basename 12/19/10 1110 12/18/10 0504  NA 137 138  K 3.5 3.5  CL 103 105  CO2 27 26  GLUCOSE 89 88  BUN 10 11  CREATININE 1.10 1.15*  CALCIUM 9.5 9.1  MG -- --  PHOS -- --   Liver Function Tests: No results found for this basename: AST:2,ALT:2,ALKPHOS:2,BILITOT:2,PROT:2,ALBUMIN:2 in the last 72 hours No results found for this basename: LIPASE:2,AMYLASE:2 in the last 72 hours No results found for this basename: AMMONIA:2 in the last 72 hours CBC:  Basename 12/19/10 1110 12/18/10 1622 12/18/10 0504 12/17/10 0530  WBC 6.9 -- 7.0 --  NEUTROABS -- -- -- 6.7  HGB 10.9* 7.9* -- --  HCT 32.1* 23.6* -- --  MCV 93.6 -- 97.6 --  PLT 211 -- 184 --   Cardiac Enzymes: No results found for this basename: CKTOTAL:3,CKMB:3,CKMBINDEX:3,TROPONINI:3 in the last 72 hours BNP: No components found with this basename: POCBNP:3 D-Dimer: No results found for this basename: DDIMER:2 in the last 72 hours CBG: No results found for this basename: GLUCAP:6 in the last 72 hours Hemoglobin A1C: No results found for this basename: HGBA1C in the last 72 hours Fasting Lipid Panel: No results found for this basename: CHOL,HDL,LDLCALC,TRIG,CHOLHDL,LDLDIRECT in the last 72 hours Thyroid Function Tests: No results found for this  basename: TSH,T4TOTAL,FREET4,T3FREE,THYROIDAB in the last 72 hours Anemia Panel: No results found for this basename: VITAMINB12,FOLATE,FERRITIN,TIBC,IRON,RETICCTPCT in the last 72 hours Coagulation: No results found for this basename: LABPROT:2,INR:2 in the last 72 hours Urine Drug Screen: Drugs of Abuse  No results found for this basename: labopia,  cocainscrnur,  labbenz,  amphetmu,  thcu,  labbarb    Alcohol Level: No results found for this basename: ETH:2 in the last 72 hours  Micro: Recent Results (from the past 240 hour(s))  URINE CULTURE     Status: Normal   Collection Time   12/16/10  4:04 AM      Component Value Range Status Comment   Specimen Description URINE, CLEAN CATCH   Final    Special Requests NONE   Final    Setup Time [id number]   Final    Colony Count NO GROWTH   Final    Culture NO GROWTH   Final    Report Status 12/17/2010 FINAL   Final   SURGICAL PCR SCREEN     Status: Normal   Collection Time   12/17/10 12:06 AM      Component Value Range Status Comment   MRSA, PCR NEGATIVE  NEGATIVE  Final    Staphylococcus aureus NEGATIVE  NEGATIVE  Final     Assessment:  Hemoglobin improved.  Home tomorrow if stable   LOS: 6 days   Stacy Meyers L 12/19/2010, 4:29 PM

## 2010-12-19 NOTE — Progress Notes (Signed)
2 Days Post-Op  Subjective: Feels much better. Tolerating regular diet. Normal bowel function. Sore but no significant nominal pain. No nausea.  Objective: Vital signs in last 24 hours: Temp:  [97.8 F (36.6 Meyers)-98.5 F (36.9 Meyers)] 98.1 F (36.7 Meyers) (12/14 0945) Pulse Rate:  [60-73] 64  (12/14 0945) Resp:  [18-20] 18  (12/14 0945) BP: (102-140)/(58-84) 112/69 mmHg (12/14 0945) SpO2:  [93 %-98 %] 98 % (12/14 0657) Last BM Date: 12/17/10  Intake/Output from previous day: 12/13 0701 - 12/14 0700 In: 1250 [I.V.:500; Blood:700; IV Piggyback:50] Out: 865 [Urine:850; Drains:15] Intake/Output this shift:    General appearance: alert and no distress GI: Positive bowel sounds, soft, flat, moderate right upper quadrant abdominal tenderness as expected. No peritoneal signs. JP drainage is serosang more on the serous side. Seizures are clean dry and intact  Lab Results:   Basename 12/18/10 1622 12/18/10 0504 12/17/10 0530  WBC -- 7.0 8.4  HGB 7.9* 8.3* --  HCT 23.6* 24.9* --  PLT -- 184 195   BMET  Basename 12/18/10 0504 12/17/10 0530  NA 138 140  K 3.5 3.5  CL 105 107  CO2 26 27  GLUCOSE 88 97  BUN 11 11  CREATININE 1.15* 1.11*  CALCIUM 9.1 9.6   PT/INR No results found for this basename: LABPROT:2,INR:2 in the last 72 hours ABG No results found for this basename: PHART:2,PCO2:2,PO2:2,HCO3:2 in the last 72 hours  Studies/Results: No results found.  Anti-infectives: Anti-infectives     Start     Dose/Rate Route Frequency Ordered Stop   12/16/10 1000   acyclovir (ZOVIRAX) 200 MG capsule 400 mg        400 mg Oral 2 times daily 12/16/10 0807     12/15/10 2200   acyclovir (ZOVIRAX) tablet 400 mg  Status:  Discontinued        400 mg Oral 2 times daily 12/15/10 1746 12/16/10 0808   12/15/10 1900   piperacillin-tazobactam (ZOSYN) IVPB 3.375 g        3.375 g 12.5 mL/hr over 240 Minutes Intravenous Every 8 hours 12/15/10 1812            Assessment/Plan: s/p  Procedure(s): LAPAROSCOPIC CHOLECYSTECTOMY Acute blood loss anemia. Patient has responded appropriately clinically care transfusions. Morning labs are still pending. I did discuss with the patient continuing to monitor hemoglobin levels however given her current evaluation my suspicion of any ongoing bleeding is extremely low. At this point we'll continue regular diet. Continue activity as tolerated. As long as hemoglobin is stable we'll likely consider discharge tomorrow.  LOS: 6 days    Stacy Meyers 12/19/2010

## 2010-12-20 LAB — CBC
Platelets: 228 10*3/uL (ref 150–400)
RBC: 3.48 MIL/uL — ABNORMAL LOW (ref 3.87–5.11)
WBC: 6.9 10*3/uL (ref 4.0–10.5)

## 2010-12-20 LAB — BASIC METABOLIC PANEL
CO2: 26 mEq/L (ref 19–32)
Chloride: 104 mEq/L (ref 96–112)
Sodium: 140 mEq/L (ref 135–145)

## 2010-12-20 LAB — TYPE AND SCREEN
Antibody Screen: NEGATIVE
Unit division: 0

## 2010-12-20 MED ORDER — HYDROCODONE-ACETAMINOPHEN 5-325 MG PO TABS: 1.0000 | ORAL_TABLET | ORAL | Status: AC | PRN

## 2010-12-20 MED ORDER — DIPHENHYDRAMINE HCL 25 MG PO CAPS
50.0000 mg | ORAL_CAPSULE | Freq: Four times a day (QID) | ORAL | Status: DC | PRN
  Administered 2010-12-20: 50 mg via ORAL
  Filled 2010-12-20: qty 2

## 2010-12-20 NOTE — Discharge Summary (Signed)
Physician Discharge Summary  Patient ID: THULA STEWART MRN: [id number] DOB/AGE: 04-25-1932 77 y.o.  Admit date: 12/13/2010 Discharge date: 12/20/2010  Admission Diagnoses: Chest pain Discharge Diagnoses: Acute cholecystitis (hydrops of the gallbladder) Principal Problem:  *Chest pain Active Problems:  SINUS BRADYCARDIA  Rheumatoid arthritis  Hypokalemia  HTN (hypertension)  ARF (acute renal failure)  Hypercalcemia  Anemia  Cholelithiasis  Diastolic dysfunction  Pyuria  Acute cholecystitis   Discharged Condition: stable  Hospital Course: Patient was admitted by the hospitalist service for chest pain. Workup was conducted which alleviated a cardiac etiology. Workup did demonstrate acute cholecystitis at which time surgical intervention was discussed. She did eventually go to the operating room and was noted to have hydrops of the gallbladder. She tolerated procedure well. She did have acute blood loss anemia following the procedure and did require transfusion of packed red blood cells. She responded to this appropriately. At this point she remained stable. She is comfortable. She is tolerating a regular diet. Plans are made for discharge home.  Consults: general surgery  Significant Diagnostic Studies: radiology: CT scan: Abdomen and pelvis  Treatments: surgery: Cholecystectomy  Discharge Exam: Blood pressure 143/80, pulse 77, temperature 97.6 F (36.4 C), temperature source Oral, resp. rate 18, height 5' 1.5" (1.562 m), weight 61.1 kg (134 lb 11.2 oz), SpO2 94.00%. General appearance: alert and no distress Resp: clear to auscultation bilaterally Cardio: regular rate and rhythm GI: Positive bowel sounds, soft, expected tenderness. No peritoneal signs. Incisions are clean dry and intact. Steri-Strips are in place. JP drain was removed today by myself.  Disposition:   Discharge Orders    Future Appointments: Provider: Department: Dept Phone: Center:   01/08/2011 8:30 AM  [name], MD [facility] [phone] [facility]     Future Orders Please Complete By Expires   Diet - low sodium heart healthy      Increase activity slowly      Discharge instructions      Comments:   Increase activity as tolerated.   Driving Restrictions      Comments:   No driving while on pain medications.    Lifting restrictions      Comments:   No lifting over 20lbs for 4-5 weeks post-op.   Discharge wound care:      Comments:   Clean surgical sites with soap and water.  May shower the morning after surgery unless instructed by Dr. Leticia Penna otherwise.  No soaking for 2-3 weeks.    If adhesive strips are in place, they may be removed in 1-2 weeks while in the shower.  Anticipate some drainage from drain site.   Call MD for:  temperature >100.4      Call MD for:  persistant nausea and vomiting      Call MD for:  severe uncontrolled pain      Call MD for:  redness, tenderness, or signs of infection (pain, swelling, redness, odor or green/yellow discharge around incision site)        Current Discharge Medication List    START taking these medications   Details  HYDROcodone-acetaminophen (NORCO) 5-325 MG per tablet Take 1-2 tablets by mouth every 4 (four) hours as needed. Qty: 45 tablet, Refills: 0      CONTINUE these medications which have NOT CHANGED   Details  acyclovir (ZOVIRAX) 400 MG tablet Take 1 tablet (400 mg total) by mouth 2 (two) times daily. Qty: 180 tablet, Refills: 0    aspirin 81 MG tablet Take 81 mg  by mouth daily.      diltiazem (CARDIZEM CD) 120 MG 24 hr capsule Take 1 capsule (120 mg total) by mouth daily. Qty: 90 capsule, Refills: 0    lisinopril-hydrochlorothiazide (PRINZIDE,ZESTORETIC) 20-25 MG per tablet TAKE 1 TABLET BY MOUTH ONCE A DAY Qty: 90 tablet, Refills: 0    meloxicam (MOBIC) 15 MG tablet TAKE 1 TABLET BY MOUTH ONCE A DAY Qty: 90 tablet, Refills: 0    methotrexate 2.5 MG tablet Take 6 tablets by mouth on Thursdays Qty: 72  tablet, Refills: 1   Associated Diagnoses: Rheumatoid arthritis    rosuvastatin (CRESTOR) 10 MG tablet Take 10 mg by mouth every other day.         Follow-up Information    Follow up with [name], MD in 2 weeks.      Follow up with Plez Belton C in 1 week.   Contact information:   [address] [place] [address] [phone]          Signed: Fabio Bering 12/20/2010, 3:15 PM

## 2010-12-21 NOTE — Progress Notes (Signed)
Discharge instructions and prescriptions given, verbalized understanding, condition stable, out via w/c with staff.

## 2010-12-24 ENCOUNTER — Encounter (HOSPITAL_COMMUNITY): Payer: Self-pay | Admitting: General Surgery

## 2010-12-26 ENCOUNTER — Other Ambulatory Visit: Payer: Self-pay | Admitting: Family Medicine

## 2011-01-05 LAB — LIPID PANEL
Cholesterol: 165 mg/dL (ref 0–200)
HDL: 40 mg/dL (ref >39)
Total CHOL/HDL Ratio: 4.1 Ratio

## 2011-01-05 LAB — BASIC METABOLIC PANEL
BUN: 29 mg/dL — ABNORMAL HIGH (ref 6–23)
CO2: 28 mEq/L (ref 19–32)
Chloride: 104 mEq/L (ref 96–112)
Glucose, Bld: 106 mg/dL — ABNORMAL HIGH (ref 70–99)
Potassium: 3.7 mEq/L (ref 3.5–5.3)
Sodium: 140 mEq/L (ref 135–145)

## 2011-01-05 LAB — HEPATIC FUNCTION PANEL
ALT: 12 U/L (ref 0–35)
AST: 19 U/L (ref 0–37)
Albumin: 4 g/dL (ref 3.5–5.2)
Alkaline Phosphatase: 76 U/L (ref 39–117)
Total Bilirubin: 0.5 mg/dL (ref 0.3–1.2)

## 2011-01-05 LAB — HEMOGLOBIN A1C: Hgb A1c MFr Bld: 5.2 % (ref <5.7)

## 2011-01-07 ENCOUNTER — Encounter: Payer: Self-pay | Admitting: Family Medicine

## 2011-01-08 ENCOUNTER — Other Ambulatory Visit (HOSPITAL_COMMUNITY)
Admission: RE | Admit: 2011-01-08 | Discharge: 2011-01-08 | Disposition: A | Discharge status: Home / Self Care | Payer: Medicare Other | Source: Ambulatory Visit | Attending: Family Medicine | Admitting: Family Medicine

## 2011-01-08 ENCOUNTER — Ambulatory Visit (INDEPENDENT_AMBULATORY_CARE_PROVIDER_SITE_OTHER): Payer: Medicare Other | Admitting: Family Medicine

## 2011-01-08 ENCOUNTER — Encounter: Payer: Self-pay | Admitting: Family Medicine

## 2011-01-08 VITALS — BP 110/70 | HR 58 | Resp 16 | Ht 59.5 in | Wt 132.0 lb

## 2011-01-08 DIAGNOSIS — Z0000 Encounter for general adult medical examination without abnormal findings: Secondary | ICD-10-CM

## 2011-01-08 DIAGNOSIS — E785 Hyperlipidemia, unspecified: Secondary | ICD-10-CM

## 2011-01-08 DIAGNOSIS — I10 Essential (primary) hypertension: Secondary | ICD-10-CM

## 2011-01-08 DIAGNOSIS — M069 Rheumatoid arthritis, unspecified: Secondary | ICD-10-CM

## 2011-01-08 DIAGNOSIS — Z124 Encounter for screening for malignant neoplasm of cervix: Secondary | ICD-10-CM | POA: Insufficient documentation

## 2011-01-08 DIAGNOSIS — B356 Tinea cruris: Secondary | ICD-10-CM | POA: Insufficient documentation

## 2011-01-08 MED ORDER — MELOXICAM 15 MG PO TABS: 15.0000 mg | ORAL_TABLET | Freq: Every day | ORAL | Status: DC

## 2011-01-08 MED ORDER — METHOTREXATE SODIUM 2.5 MG PO TABS: ORAL_TABLET | ORAL | Status: DC

## 2011-01-08 MED ORDER — DILTIAZEM HCL ER COATED BEADS 120 MG PO CP24: 120.0000 mg | ORAL_CAPSULE | Freq: Every day | ORAL | Status: DC

## 2011-01-08 MED ORDER — ROSUVASTATIN CALCIUM 10 MG PO TABS: 10.0000 mg | ORAL_TABLET | ORAL | Status: DC

## 2011-01-08 MED ORDER — LISINOPRIL-HYDROCHLOROTHIAZIDE 20-25 MG PO TABS: 1.0000 | ORAL_TABLET | Freq: Every day | ORAL | Status: DC

## 2011-01-08 MED ORDER — ACYCLOVIR 400 MG PO TABS: 400.0000 mg | ORAL_TABLET | Freq: Two times a day (BID) | ORAL | Status: DC

## 2011-01-08 MED ORDER — TERBINAFINE HCL 1 % EX CREA: TOPICAL_CREAM | Freq: Two times a day (BID) | CUTANEOUS | Status: DC

## 2011-01-08 NOTE — Patient Instructions (Addendum)
F/u in 4.5 months.  Call if you need me before.  Fasting labs next week.  No med changes.  I hope you continue to do well  Med is sent in for the rash

## 2011-01-10 ENCOUNTER — Encounter: Payer: Self-pay | Admitting: Family Medicine

## 2011-01-10 NOTE — Assessment & Plan Note (Signed)
Controlled, no change in medication  

## 2011-01-10 NOTE — Assessment & Plan Note (Signed)
Controlled, no change in medication  rept labs prior to next visit

## 2011-01-10 NOTE — Progress Notes (Signed)
Subjective:     Patient ID: Stacy Meyers, female   DOB: Jun 04, 1932, 78 y.o.   MRN: [id number]  HPI The PT is here for annual exam and re-evaluation of chronic medical conditions, medication management and review of any available recent lab and radiology data.  Preventive health is updated, specifically  Cancer screening and Immunization.   Questions or concerns regarding consultations or procedures which the PT has had in the interim are  Addressed.She had acute cholecystitis followed by uncomplicated cholecystectomy since her last visit The PT denies any adverse reactions to current medications since the last visit.  There are no new concerns.  C/o pruritic rash in inguinal region       Review of Systems See HPI Denies recent fever or chills. Denies sinus pressure, nasal congestion, ear pain or sore throat. Denies chest congestion, productive cough or wheezing. Denies chest pains, palpitations and leg swelling Denies abdominal pain, nausea, vomiting,diarrhea or constipation.   Denies dysuria, frequency, hesitancy or incontinence. Denies joint pain, swelling and limitation in mobility. Denies headaches, seizures, numbness, or tingling. Denies depression, anxiety or insomnia.         Objective:   Physical Exam Pleasant well nourished female, alert and oriented x 3, in no cardio-pulmonary distress. Afebrile. HEENT No facial trauma or asymetry. Sinuses non tender.  EOMI, PERTL,   External ears normal, tympanic membranes clear. Oropharynx moist, no exudate, poor dentition. Neck: supple, no adenopathy,JVD or thyromegaly.No bruits.  Chest: Clear to ascultation bilaterally.No crackles or wheezes. Non tender to palpation  Breast: No asymetry,no masses. No nipple discharge or inversion. No axillary or supraclavicular adenopathy  Cardiovascular system; Heart sounds normal,  S1 and  S2 ,no S3.  No murmur, or thrill. Apical beat not displaced Peripheral pulses  normal.  Abdomen: Soft, non tender, no organomegaly or masses. No bruits. Bowel sounds normal. No guarding, tenderness or rebound.  Rectal:  No mass. Guaiac negative stool.  GU: External genitalia normal. No lesions. Vaginal canal normal.No discharge. Uterus atrophc, no adnexal masses, no cervical motion or adnexal tenderness.  Musculoskeletal exam: Decreased though adequate ROM  of spine,adequate in  hips , shoulders and knees. No deformity ,swelling or crepitus noted. No muscle wasting or atrophy.   Neurologic: Cranial nerves 2 to 12 intact. Power, tone ,sensation and reflexes normal throughout. No disturbance in gait. No tremor.  Skin: Intact, .tinea cruris Pigmentation normal throughout  Psych; Normal mood and affect. Judgement and concentration normal     Assessment:         Plan:

## 2011-01-10 NOTE — Assessment & Plan Note (Signed)
Stable on chronic medication, no change

## 2011-01-10 NOTE — Assessment & Plan Note (Signed)
Acute flare med prescribed 

## 2011-01-27 DIAGNOSIS — E785 Hyperlipidemia, unspecified: Secondary | ICD-10-CM | POA: Diagnosis not present

## 2011-01-27 DIAGNOSIS — Z0000 Encounter for general adult medical examination without abnormal findings: Secondary | ICD-10-CM | POA: Diagnosis not present

## 2011-01-28 LAB — LIPID PANEL
Cholesterol: 153 mg/dL (ref 0–200)
Total CHOL/HDL Ratio: 3.4 Ratio
Triglycerides: 114 mg/dL (ref <150)
VLDL: 23 mg/dL (ref 0–40)

## 2011-01-28 LAB — COMPREHENSIVE METABOLIC PANEL
AST: 17 U/L (ref 0–37)
Alkaline Phosphatase: 79 U/L (ref 39–117)
BUN: 39 mg/dL — ABNORMAL HIGH (ref 6–23)
Calcium: 10.6 mg/dL — ABNORMAL HIGH (ref 8.4–10.5)
Chloride: 103 mEq/L (ref 96–112)
Creat: 1 mg/dL (ref 0.50–1.10)

## 2011-02-18 ENCOUNTER — Ambulatory Visit (INDEPENDENT_AMBULATORY_CARE_PROVIDER_SITE_OTHER): Payer: Medicare Other | Admitting: Family Medicine

## 2011-02-18 ENCOUNTER — Encounter: Payer: Self-pay | Admitting: Family Medicine

## 2011-02-18 VITALS — BP 104/62 | HR 61 | Temp 98.9°F | Resp 16 | Ht 59.5 in | Wt 130.1 lb

## 2011-02-18 DIAGNOSIS — M069 Rheumatoid arthritis, unspecified: Secondary | ICD-10-CM

## 2011-02-18 DIAGNOSIS — E785 Hyperlipidemia, unspecified: Secondary | ICD-10-CM | POA: Diagnosis not present

## 2011-02-18 DIAGNOSIS — J209 Acute bronchitis, unspecified: Secondary | ICD-10-CM | POA: Insufficient documentation

## 2011-02-18 DIAGNOSIS — I10 Essential (primary) hypertension: Secondary | ICD-10-CM

## 2011-02-18 MED ORDER — CEFTRIAXONE SODIUM 1 G IJ SOLR
500.0000 mg | Freq: Once | INTRAMUSCULAR | Status: AC
  Administered 2011-02-18: 500 mg via INTRAMUSCULAR

## 2011-02-18 MED ORDER — AZITHROMYCIN 250 MG PO TABS: ORAL_TABLET | ORAL | Status: AC

## 2011-02-18 MED ORDER — BENZONATATE 100 MG PO CAPS: 100.0000 mg | ORAL_CAPSULE | Freq: Four times a day (QID) | ORAL | Status: DC | PRN

## 2011-02-18 NOTE — Progress Notes (Signed)
  Subjective:    Patient ID: Stacy Meyers, female    DOB: 22-Mar-1932, 78 y.o.   MRN: [id number]  HPI 5 day h/o generalized aches with head and chest congestion, cough productive of yellow sputum, generalized body aches and fatigue   Review of Systems See HPI c/o recent fever or chills. Denies sinus pressure, nasal congestion, ear pain or sore throat.  Denies chest pains, palpitations and leg swelling Denies abdominal pain, nausea, vomiting,diarrhea or constipation.   Denies dysuria, frequency, hesitancy or incontinence. Denies joint pain, swelling and limitation in mobility. Denies headaches, seizures, numbness, or tingling. Denies depression, anxiety or insomnia. Denies skin break down or rash.        Objective:   Physical Exam  Patient alert and oriented and in no cardiopulmonary distress.Ill appearing  HEENT: No facial asymmetry, EOMI, no sinus tenderness,  oropharynx pink and moist.  Neck supple no adenopathy.  Chest:decreased air entry, bilateral crackles, no wheezes CVS: S1, S2 no murmurs, no S3.  ABD: Soft non tender. Bowel sounds normal.  Ext: No edema  MS: Adequate ROM spine, shoulders, hips and knees.       Assessment & Plan:

## 2011-02-18 NOTE — Patient Instructions (Signed)
F/u as before.  You will get Rocephin 500mg IiM in the office , and an antibioric and decongestants are sent to the pharmacy.  Happy Birthday, and I hope you feel better soon!

## 2011-03-08 NOTE — Assessment & Plan Note (Signed)
Hyperlipidemia:Low fat diet discussed and encouraged.  Controlled, no change in medication   

## 2011-03-08 NOTE — Assessment & Plan Note (Signed)
Antibiotic administered and prescribed 

## 2011-03-08 NOTE — Assessment & Plan Note (Signed)
Controlled, no change in medication  

## 2011-03-18 ENCOUNTER — Other Ambulatory Visit: Payer: Self-pay | Admitting: Family Medicine

## 2011-04-07 ENCOUNTER — Ambulatory Visit (INDEPENDENT_AMBULATORY_CARE_PROVIDER_SITE_OTHER): Payer: Medicare Other | Admitting: Family Medicine

## 2011-04-07 ENCOUNTER — Encounter: Payer: Self-pay | Admitting: Family Medicine

## 2011-04-07 ENCOUNTER — Ambulatory Visit (HOSPITAL_COMMUNITY)
Admission: RE | Admit: 2011-04-07 | Discharge: 2011-04-07 | Disposition: A | Discharge status: Home / Self Care | Payer: Medicare Other | Source: Ambulatory Visit | Attending: Family Medicine | Admitting: Family Medicine

## 2011-04-07 VITALS — BP 110/66 | HR 70 | Temp 98.5°F | Resp 16 | Ht 59.5 in | Wt 125.8 lb

## 2011-04-07 DIAGNOSIS — I10 Essential (primary) hypertension: Secondary | ICD-10-CM

## 2011-04-07 DIAGNOSIS — J209 Acute bronchitis, unspecified: Secondary | ICD-10-CM | POA: Diagnosis not present

## 2011-04-07 DIAGNOSIS — R634 Abnormal weight loss: Secondary | ICD-10-CM | POA: Diagnosis not present

## 2011-04-07 DIAGNOSIS — M069 Rheumatoid arthritis, unspecified: Secondary | ICD-10-CM | POA: Diagnosis not present

## 2011-04-07 DIAGNOSIS — J40 Bronchitis, not specified as acute or chronic: Secondary | ICD-10-CM | POA: Diagnosis not present

## 2011-04-07 DIAGNOSIS — I517 Cardiomegaly: Secondary | ICD-10-CM | POA: Insufficient documentation

## 2011-04-07 DIAGNOSIS — R9389 Abnormal findings on diagnostic imaging of other specified body structures: Secondary | ICD-10-CM | POA: Diagnosis not present

## 2011-04-07 MED ORDER — LEVOFLOXACIN 750 MG PO TABS: 750.0000 mg | ORAL_TABLET | Freq: Every day | ORAL | Status: AC

## 2011-04-07 MED ORDER — CEFTRIAXONE SODIUM 1 G IJ SOLR
500.0000 mg | Freq: Once | INTRAMUSCULAR | Status: AC
Start: 2011-04-07 — End: 2011-04-07
  Administered 2011-04-07: 500 mg via INTRAMUSCULAR

## 2011-04-07 MED ORDER — ALBUTEROL SULFATE (5 MG/ML) 0.5% IN NEBU
2.5000 mg | INHALATION_SOLUTION | Freq: Once | RESPIRATORY_TRACT | Status: AC
  Administered 2011-04-07: 2.5 mg via RESPIRATORY_TRACT

## 2011-04-07 MED ORDER — BENZONATATE 100 MG PO CAPS: 100.0000 mg | ORAL_CAPSULE | Freq: Four times a day (QID) | ORAL | Status: DC | PRN

## 2011-04-07 MED ORDER — IPRATROPIUM BROMIDE 0.02 % IN SOLN
0.5000 mg | Freq: Once | RESPIRATORY_TRACT | Status: AC
  Administered 2011-04-07: 0.5 mg via RESPIRATORY_TRACT

## 2011-04-07 NOTE — Patient Instructions (Addendum)
F/u in 2.5 weeks.   You are being treated for acute bronchitis. You need a CXR. Medication is sent to the pharmacy  And you will have a breathing treatment and rocephin in the office also.   You are being referred to Dr Karilyn Cota regarding weight loss and early satiety, and we will attempt to locate your last colonoscopy

## 2011-04-07 NOTE — Progress Notes (Signed)
  Subjective:    Patient ID: Stacy Meyers, female    DOB: [birth year], 78 y.o.   MRN: [id number]  HPI 4 day h/o deep cough with congestion chills and low grade fever. Feels weak, has occasional nausea. Denies nasal or sinus congestion, no sore throat. Significant unintentional weight loss with early satiety, ost noted following her cholecystectomy less than 6 months ago. Denies chane in bowel movements   Review of Systems See HPI  Denies chest pains, palpitations and leg swelling Denies abdominal pain, nausea, vomiting,diarrhea or constipation.   Denies dysuria, frequency, hesitancy or incontinence. Denies joint pain, swelling and limitation in mobility. Denies headaches, seizures, numbness, or tingling. Denies depression, anxiety or insomnia. Denies skin break down or rash.        Objective:   Physical Exam Patient alert and oriented and in no cardiopulmonary distress.Ill appearing  HEENT: No facial asymmetry, EOMI, no sinus tenderness,  oropharynx pink and moist.  Neck supple no adenopathy.  Chest: decreased air entry, scattered crackles, no wheezes CVS: S1, S2 no murmurs, no S3.  ABD: Soft non tender. Bowel sounds normal.  Ext: No edema  MS: Adequate ROM spine, shoulders, hips and knees.  Skin: Intact, no ulcerations or rash noted.  Psych: Good eye contact, normal affect. Memory intact not anxious or depressed appearing.  CNS: CN 2-12 intact, power, tone and sensation normal throughout.        Assessment & Plan:

## 2011-04-10 ENCOUNTER — Ambulatory Visit (INDEPENDENT_AMBULATORY_CARE_PROVIDER_SITE_OTHER): Payer: Medicare Other | Admitting: Family Medicine

## 2011-04-10 ENCOUNTER — Ambulatory Visit (HOSPITAL_COMMUNITY)
Admission: RE | Admit: 2011-04-10 | Discharge: 2011-04-10 | Disposition: A | Discharge status: Home / Self Care | Payer: Medicare Other | Source: Ambulatory Visit | Attending: Family Medicine | Admitting: Family Medicine

## 2011-04-10 ENCOUNTER — Encounter: Payer: Self-pay | Admitting: Family Medicine

## 2011-04-10 VITALS — BP 110/70 | HR 73 | Resp 18 | Ht 59.5 in | Wt 122.0 lb

## 2011-04-10 DIAGNOSIS — IMO0001 Reserved for inherently not codable concepts without codable children: Secondary | ICD-10-CM

## 2011-04-10 DIAGNOSIS — I10 Essential (primary) hypertension: Secondary | ICD-10-CM

## 2011-04-10 DIAGNOSIS — N3090 Cystitis, unspecified without hematuria: Secondary | ICD-10-CM

## 2011-04-10 DIAGNOSIS — R109 Unspecified abdominal pain: Secondary | ICD-10-CM | POA: Diagnosis not present

## 2011-04-10 DIAGNOSIS — R350 Frequency of micturition: Secondary | ICD-10-CM | POA: Diagnosis not present

## 2011-04-10 DIAGNOSIS — R112 Nausea with vomiting, unspecified: Secondary | ICD-10-CM | POA: Insufficient documentation

## 2011-04-10 DIAGNOSIS — E860 Dehydration: Secondary | ICD-10-CM

## 2011-04-10 DIAGNOSIS — N3000 Acute cystitis without hematuria: Secondary | ICD-10-CM

## 2011-04-10 LAB — HEPATIC FUNCTION PANEL
ALT: 16 U/L (ref 0–35)
AST: 20 U/L (ref 0–37)
Albumin: 3.9 g/dL (ref 3.5–5.2)
Alkaline Phosphatase: 76 U/L (ref 39–117)

## 2011-04-10 LAB — BASIC METABOLIC PANEL
BUN: 32 mg/dL — ABNORMAL HIGH (ref 6–23)
Chloride: 97 mEq/L (ref 96–112)
Glucose, Bld: 104 mg/dL — ABNORMAL HIGH (ref 70–99)
Potassium: 3.4 mEq/L — ABNORMAL LOW (ref 3.5–5.3)
Sodium: 137 mEq/L (ref 135–145)

## 2011-04-10 LAB — CBC WITH DIFFERENTIAL/PLATELET
HCT: 34.2 % — ABNORMAL LOW (ref 36.0–46.0)
Hemoglobin: 11.4 g/dL — ABNORMAL LOW (ref 12.0–15.0)
Lymphocytes Relative: 17 % (ref 12–46)
Lymphs Abs: 1.1 10*3/uL (ref 0.7–4.0)
Monocytes Absolute: 0.3 10*3/uL (ref 0.1–1.0)
Monocytes Relative: 5 % (ref 3–12)
Neutro Abs: 4.8 10*3/uL (ref 1.7–7.7)
Neutrophils Relative %: 77 % (ref 43–77)
RBC: 3.61 MIL/uL — ABNORMAL LOW (ref 3.87–5.11)
WBC: 6.2 10*3/uL (ref 4.0–10.5)

## 2011-04-10 LAB — POCT URINALYSIS DIPSTICK
Ketones, UA: NEGATIVE
Leukocytes, UA: NEGATIVE
Protein, UA: 100
Spec Grav, UA: >=1.03

## 2011-04-10 LAB — LIPASE: Lipase: 17 U/L (ref 11–59)

## 2011-04-10 MED ORDER — ONDANSETRON HCL 4 MG/2ML IJ SOLN
4.0000 mg | Freq: Once | INTRAMUSCULAR | Status: AC
  Administered 2011-04-10: 4 mg via INTRAMUSCULAR

## 2011-04-10 MED ORDER — PROMETHAZINE HCL 12.5 MG PO TABS: 12.5000 mg | ORAL_TABLET | Freq: Four times a day (QID) | ORAL | Status: DC | PRN

## 2011-04-10 NOTE — Progress Notes (Signed)
  Subjective:    Patient ID: Stacy Meyers, female    DOB: [birth year], 78 y.o.   MRN: [id number]  HPI  Pt rerturns feeling weaker and more ill than she did 3 days ago. States she is unable to keep anything down, vomiting constantly, and denies diareah. No other family member similarly affected. C/o increased urinary frequency with passing small quantities of urine, and mild dysuria. Denies flank pain, has had  chills , no documented fever  Review of Systems See HPI Denies recent fever or chills. Denies chest pains, palpitations and leg swelling Denies joint pain, swelling and limitation in mobility. Denies headaches, seizures, numbness, or tingling. Denies depression, anxiety or insomnia. Denies skin break down or rash.        Objective:   Physical Exam Patient alert and oriented and in no cardiopulmonary distress.Ill appearing  HEENT: No facial asymmetry, EOMI, no sinus tenderness,  Mucosa dry.  Neck supple no adenopathy.  Chest: adequate air entry, few crackles , no wheezes CVS: S1, S2 no murmurs, no S3.  ABD: Soft diffuse superficial tenderness, no guarding or rebound, hyperactive bowel sounds. No renal angle tenderness . No suprapubic tenderness Ext: No edema  MS: Adequate ROM spine, shoulders, hips and knees.  Skin: Intact, no ulcerations or rash noted.  Psych: Good eye contact, normal affect. Memory intact not anxious or depressed appearing.  CNS: CN 2-12 intact, power, tone and sensation normal throughout.        Assessment & Plan:

## 2011-04-10 NOTE — Patient Instructions (Signed)
F/u in 2 weeks.  You will get injection for nausea today in the office and medication is sent to your pharmacy, this has a sleepy side effect, so be careful.  Labs will be done today and an xray of your abdomen. We will contact you at home today with the results, if very abnormal i will send you to the Ed for further evaluation  I hope that you feel better soon  You will get information on the BRAT diet, it is important to drink small quantities often so that you do not get dehydrated

## 2011-04-11 DIAGNOSIS — N3001 Acute cystitis with hematuria: Secondary | ICD-10-CM | POA: Insufficient documentation

## 2011-04-11 DIAGNOSIS — N3000 Acute cystitis without hematuria: Secondary | ICD-10-CM | POA: Insufficient documentation

## 2011-04-11 HISTORY — DX: Acute cystitis with hematuria: N30.01

## 2011-04-11 LAB — URINALYSIS
Glucose, UA: NEGATIVE mg/dL
Ketones, ur: NEGATIVE mg/dL
Protein, ur: 30 mg/dL — AB
Specific Gravity, Urine: 1.024 (ref 1.005–1.030)
Urobilinogen, UA: 0.2 mg/dL (ref 0.0–1.0)

## 2011-04-11 NOTE — Assessment & Plan Note (Signed)
Controlled, no change in medication  

## 2011-04-11 NOTE — Assessment & Plan Note (Signed)
Acute onset, symptomatic treatment and investigation , with close follow up

## 2011-04-11 NOTE — Assessment & Plan Note (Signed)
Acutely symptomatic, in house UA abnormal , will send to lab for further evaluation

## 2011-04-12 LAB — URINE CULTURE: Colony Count: NO GROWTH

## 2011-04-13 DIAGNOSIS — E860 Dehydration: Secondary | ICD-10-CM | POA: Diagnosis not present

## 2011-04-13 NOTE — Progress Notes (Signed)
Addended by: Kandis Fantasia B on: 04/13/2011 05:07 PM   Modules accepted: Orders

## 2011-04-13 NOTE — Assessment & Plan Note (Signed)
Referral to GI for further eval, most has occurred since cholecystecytomy

## 2011-04-13 NOTE — Assessment & Plan Note (Signed)
Stable on medication

## 2011-04-13 NOTE — Assessment & Plan Note (Signed)
Controlled, no change in medication  

## 2011-04-13 NOTE — Assessment & Plan Note (Signed)
Acute episode, antibiotic administered and prescribed 

## 2011-04-15 DIAGNOSIS — E860 Dehydration: Secondary | ICD-10-CM | POA: Diagnosis not present

## 2011-04-15 LAB — BASIC METABOLIC PANEL
CO2: 26 mEq/L (ref 19–32)
Calcium: 10.4 mg/dL (ref 8.4–10.5)
Chloride: 101 mEq/L (ref 96–112)
Glucose, Bld: 104 mg/dL — ABNORMAL HIGH (ref 70–99)
Sodium: 140 mEq/L (ref 135–145)

## 2011-04-16 ENCOUNTER — Inpatient Hospital Stay (HOSPITAL_COMMUNITY)
Admission: EM | Admit: 2011-04-16 | Discharge: 2011-04-18 | DRG: 684 | Disposition: A | Discharge status: Home / Self Care | Payer: Medicare Other | Attending: Internal Medicine | Admitting: Internal Medicine

## 2011-04-16 ENCOUNTER — Encounter (HOSPITAL_COMMUNITY): Payer: Self-pay | Admitting: *Deleted

## 2011-04-16 DIAGNOSIS — K429 Umbilical hernia without obstruction or gangrene: Secondary | ICD-10-CM | POA: Diagnosis present

## 2011-04-16 DIAGNOSIS — N281 Cyst of kidney, acquired: Secondary | ICD-10-CM | POA: Diagnosis not present

## 2011-04-16 DIAGNOSIS — R9431 Abnormal electrocardiogram [ECG] [EKG]: Secondary | ICD-10-CM

## 2011-04-16 DIAGNOSIS — E782 Mixed hyperlipidemia: Secondary | ICD-10-CM | POA: Diagnosis present

## 2011-04-16 DIAGNOSIS — M1990 Unspecified osteoarthritis, unspecified site: Secondary | ICD-10-CM | POA: Diagnosis present

## 2011-04-16 DIAGNOSIS — Z79899 Other long term (current) drug therapy: Secondary | ICD-10-CM

## 2011-04-16 DIAGNOSIS — E860 Dehydration: Secondary | ICD-10-CM | POA: Diagnosis not present

## 2011-04-16 DIAGNOSIS — N289 Disorder of kidney and ureter, unspecified: Secondary | ICD-10-CM

## 2011-04-16 DIAGNOSIS — T59811A Toxic effect of smoke, accidental (unintentional), initial encounter: Secondary | ICD-10-CM | POA: Diagnosis present

## 2011-04-16 DIAGNOSIS — D649 Anemia, unspecified: Secondary | ICD-10-CM | POA: Diagnosis present

## 2011-04-16 DIAGNOSIS — I498 Other specified cardiac arrhythmias: Secondary | ICD-10-CM

## 2011-04-16 DIAGNOSIS — I5189 Other ill-defined heart diseases: Secondary | ICD-10-CM

## 2011-04-16 DIAGNOSIS — E785 Hyperlipidemia, unspecified: Secondary | ICD-10-CM

## 2011-04-16 DIAGNOSIS — R011 Cardiac murmur, unspecified: Secondary | ICD-10-CM | POA: Diagnosis present

## 2011-04-16 DIAGNOSIS — B356 Tinea cruris: Secondary | ICD-10-CM

## 2011-04-16 DIAGNOSIS — R112 Nausea with vomiting, unspecified: Secondary | ICD-10-CM | POA: Diagnosis present

## 2011-04-16 DIAGNOSIS — B009 Herpesviral infection, unspecified: Secondary | ICD-10-CM

## 2011-04-16 DIAGNOSIS — R5381 Other malaise: Secondary | ICD-10-CM | POA: Diagnosis not present

## 2011-04-16 DIAGNOSIS — T59891A Toxic effect of other specified gases, fumes and vapors, accidental (unintentional), initial encounter: Secondary | ICD-10-CM | POA: Diagnosis present

## 2011-04-16 DIAGNOSIS — M069 Rheumatoid arthritis, unspecified: Secondary | ICD-10-CM | POA: Diagnosis present

## 2011-04-16 DIAGNOSIS — I495 Sick sinus syndrome: Secondary | ICD-10-CM

## 2011-04-16 DIAGNOSIS — M6281 Muscle weakness (generalized): Secondary | ICD-10-CM

## 2011-04-16 DIAGNOSIS — R634 Abnormal weight loss: Secondary | ICD-10-CM | POA: Diagnosis present

## 2011-04-16 DIAGNOSIS — E869 Volume depletion, unspecified: Secondary | ICD-10-CM | POA: Diagnosis not present

## 2011-04-16 DIAGNOSIS — R7301 Impaired fasting glucose: Secondary | ICD-10-CM

## 2011-04-16 DIAGNOSIS — Z9089 Acquired absence of other organs: Secondary | ICD-10-CM | POA: Diagnosis not present

## 2011-04-16 DIAGNOSIS — N179 Acute kidney failure, unspecified: Principal | ICD-10-CM | POA: Diagnosis present

## 2011-04-16 DIAGNOSIS — I10 Essential (primary) hypertension: Secondary | ICD-10-CM | POA: Diagnosis present

## 2011-04-16 DIAGNOSIS — R5383 Other fatigue: Secondary | ICD-10-CM | POA: Diagnosis not present

## 2011-04-16 DIAGNOSIS — N3000 Acute cystitis without hematuria: Secondary | ICD-10-CM

## 2011-04-16 DIAGNOSIS — E876 Hypokalemia: Secondary | ICD-10-CM | POA: Diagnosis present

## 2011-04-16 DIAGNOSIS — J209 Acute bronchitis, unspecified: Secondary | ICD-10-CM | POA: Diagnosis not present

## 2011-04-16 DIAGNOSIS — J302 Other seasonal allergic rhinitis: Secondary | ICD-10-CM

## 2011-04-16 DIAGNOSIS — IMO0002 Reserved for concepts with insufficient information to code with codable children: Secondary | ICD-10-CM

## 2011-04-16 DIAGNOSIS — R8281 Pyuria: Secondary | ICD-10-CM

## 2011-04-16 DIAGNOSIS — Y92009 Unspecified place in unspecified non-institutional (private) residence as the place of occurrence of the external cause: Secondary | ICD-10-CM

## 2011-04-16 LAB — URINALYSIS, ROUTINE W REFLEX MICROSCOPIC
Glucose, UA: NEGATIVE mg/dL
Hgb urine dipstick: NEGATIVE
Leukocytes, UA: NEGATIVE
Protein, ur: NEGATIVE mg/dL
Specific Gravity, Urine: >1.03 — ABNORMAL HIGH (ref 1.005–1.030)
Urobilinogen, UA: 0.2 mg/dL (ref 0.0–1.0)

## 2011-04-16 LAB — DIFFERENTIAL
Basophils Absolute: 0 10*3/uL (ref 0.0–0.1)
Basophils Relative: 0 % (ref 0–1)
Eosinophils Absolute: 0 10*3/uL (ref 0.0–0.7)
Eosinophils Relative: 1 % (ref 0–5)
Monocytes Absolute: 0.5 10*3/uL (ref 0.1–1.0)
Monocytes Relative: 11 % (ref 3–12)

## 2011-04-16 LAB — CBC
HCT: 31.9 % — ABNORMAL LOW (ref 36.0–46.0)
Hemoglobin: 10.8 g/dL — ABNORMAL LOW (ref 12.0–15.0)
MCH: 31.6 pg (ref 26.0–34.0)
MCHC: 33.9 g/dL (ref 30.0–36.0)
MCV: 93.3 fL (ref 78.0–100.0)
RDW: 15.9 % — ABNORMAL HIGH (ref 11.5–15.5)

## 2011-04-16 LAB — COMPREHENSIVE METABOLIC PANEL
AST: 20 U/L (ref 0–37)
Albumin: 4 g/dL (ref 3.5–5.2)
BUN: 50 mg/dL — ABNORMAL HIGH (ref 6–23)
Calcium: 11.4 mg/dL — ABNORMAL HIGH (ref 8.4–10.5)
Creatinine, Ser: 2.21 mg/dL — ABNORMAL HIGH (ref 0.50–1.10)
GFR calc non Af Amer: 20 mL/min — ABNORMAL LOW (ref >90)
Total Bilirubin: 0.3 mg/dL (ref 0.3–1.2)

## 2011-04-16 LAB — CARDIAC PANEL(CRET KIN+CKTOT+MB+TROPI)
Relative Index: 1.7 (ref 0.0–2.5)
Total CK: 103 U/L (ref 7–177)

## 2011-04-16 LAB — PROTIME-INR
INR: 1.16 (ref 0.00–1.49)
Prothrombin Time: 15 seconds (ref 11.6–15.2)

## 2011-04-16 MED ORDER — ACETAMINOPHEN 650 MG RE SUPP: 650.0000 mg | Freq: Four times a day (QID) | RECTAL | Status: DC | PRN

## 2011-04-16 MED ORDER — SODIUM CHLORIDE 0.9 % IJ SOLN
INTRAMUSCULAR | Status: AC
  Administered 2011-04-17: 3 mL
  Filled 2011-04-16: qty 3

## 2011-04-16 MED ORDER — SODIUM CHLORIDE 0.9 % IV SOLN
INTRAVENOUS | Status: AC
  Administered 2011-04-16: 20:00:00 via INTRAVENOUS

## 2011-04-16 MED ORDER — POTASSIUM CHLORIDE IN NACL 20-0.9 MEQ/L-% IV SOLN
INTRAVENOUS | Status: AC
  Administered 2011-04-16: 21:00:00 via INTRAVENOUS

## 2011-04-16 MED ORDER — ONDANSETRON HCL 4 MG PO TABS: 4.0000 mg | ORAL_TABLET | Freq: Four times a day (QID) | ORAL | Status: DC | PRN

## 2011-04-16 MED ORDER — ONDANSETRON HCL 4 MG/2ML IJ SOLN
4.0000 mg | Freq: Once | INTRAMUSCULAR | Status: AC
  Administered 2011-04-16: 4 mg via INTRAVENOUS
  Filled 2011-04-16: qty 2

## 2011-04-16 MED ORDER — ATORVASTATIN CALCIUM 20 MG PO TABS
20.0000 mg | ORAL_TABLET | Freq: Every day | ORAL | Status: DC
  Administered 2011-04-17: 20 mg via ORAL
  Filled 2011-04-16: qty 1

## 2011-04-16 MED ORDER — ALBUTEROL SULFATE HFA 108 (90 BASE) MCG/ACT IN AERS
2.0000 | INHALATION_SPRAY | Freq: Four times a day (QID) | RESPIRATORY_TRACT | Status: DC
  Administered 2011-04-16 – 2011-04-18 (×6): 2 via RESPIRATORY_TRACT
  Filled 2011-04-16: qty 6.7

## 2011-04-16 MED ORDER — SODIUM CHLORIDE 0.9 % IV SOLN
INTRAVENOUS | Status: DC
  Administered 2011-04-16: 19:00:00 via INTRAVENOUS

## 2011-04-16 MED ORDER — BENZONATATE 100 MG PO CAPS: 100.0000 mg | ORAL_CAPSULE | Freq: Four times a day (QID) | ORAL | Status: DC | PRN

## 2011-04-16 MED ORDER — DILTIAZEM HCL ER COATED BEADS 120 MG PO CP24
120.0000 mg | ORAL_CAPSULE | Freq: Every day | ORAL | Status: DC
  Administered 2011-04-17 – 2011-04-18 (×2): 120 mg via ORAL
  Filled 2011-04-16 (×2): qty 1

## 2011-04-16 MED ORDER — ONDANSETRON HCL 4 MG/2ML IJ SOLN: 4.0000 mg | Freq: Four times a day (QID) | INTRAMUSCULAR | Status: DC | PRN

## 2011-04-16 MED ORDER — ACETAMINOPHEN 325 MG PO TABS: 650.0000 mg | ORAL_TABLET | Freq: Four times a day (QID) | ORAL | Status: DC | PRN

## 2011-04-16 MED ORDER — ACYCLOVIR 400 MG PO TABS
400.0000 mg | ORAL_TABLET | Freq: Two times a day (BID) | ORAL | Status: DC
  Administered 2011-04-16: 400 mg via ORAL
  Filled 2011-04-16 (×2): qty 1

## 2011-04-16 MED ORDER — PANTOPRAZOLE SODIUM 40 MG IV SOLR
40.0000 mg | Freq: Two times a day (BID) | INTRAVENOUS | Status: DC
  Administered 2011-04-16 – 2011-04-18 (×4): 40 mg via INTRAVENOUS
  Filled 2011-04-16 (×4): qty 40

## 2011-04-16 MED ORDER — ALBUTEROL SULFATE (5 MG/ML) 0.5% IN NEBU: 2.5000 mg | INHALATION_SOLUTION | RESPIRATORY_TRACT | Status: DC | PRN

## 2011-04-16 MED ORDER — FLUTICASONE PROPIONATE 50 MCG/ACT NA SUSP
1.0000 | Freq: Every day | NASAL | Status: DC
  Administered 2011-04-17 – 2011-04-18 (×2): 1 via NASAL
  Filled 2011-04-16: qty 16

## 2011-04-16 MED ORDER — ACYCLOVIR 200 MG PO CAPS
ORAL_CAPSULE | ORAL | Status: AC
  Filled 2011-04-16: qty 2

## 2011-04-16 MED ORDER — SODIUM CHLORIDE 0.9 % IV BOLUS (SEPSIS)
500.0000 mL | Freq: Once | INTRAVENOUS | Status: AC
  Administered 2011-04-16: 500 mL via INTRAVENOUS

## 2011-04-16 NOTE — H&P (Signed)
Stacy Meyers is an 78 y.o. female.    PCP: [name], MD, MD   Chief Complaint: Dehydration  HPI: This is a 78 year old, African American female, who is quite pleasant to talk to, who has a history of hypertension, Rheumatoid arthritis, who was in her usual state of health, she tells me, till back in December when she had some abdominal pain and was diagnosed with the cholelithiasis. She underwent surgery and had her gallbladder removed. Subsequently everything was going fine till about 3 weeks ago, when she started having nausea along with vomiting. She started throwing up anything that she would eat or drink. She also had dry heaves. The symptoms would be there day and night. She's lost about 10 pounds over the course of the last 2 months. The vomiting would be about 4-5 times during the course of the day without any blood. She denies any abdominal pain. She denies any diarrhea. She's never had these symptoms before. Denies any history of diabetes. She does mention, that she's got a knot in her stomach, which is more prominent when she is standing up and goes away when she's lying down. However, she doesn't have any pain associated with the knot. She went to her doctor's office last week and was diagnosed with acute bronchitis. She was treated with antibiotics and inhalers and she is much improved. She still has a lingering cough. However, it should, be noted that her symptoms of nausea and vomiting predates the diagnosis of bronchitis. And, then on Wednesday she had blood work done, and the results of which were available today, which showed that patient was dehydrated, and she was asked to come in to the hospital for further evaluation.   Home Medications: Prior to Admission medications   Medication Sig Start Date End Date Taking? Authorizing Provider  acyclovir (ZOVIRAX) 400 MG tablet Take 1 tablet (400 mg total) by mouth 2 (two) times daily. 01/08/11  Yes Kerri Perches, MD    benzonatate (TESSALON PERLES) 100 MG capsule Take 1 capsule (100 mg total) by mouth every 6 (six) hours as needed for cough. 04/07/11 04/06/12 Yes Kerri Perches, MD  diltiazem (CARDIZEM CD) 120 MG 24 hr capsule Take 1 capsule (120 mg total) by mouth daily. 01/08/11  Yes Kerri Perches, MD  fluticasone (FLONASE) 50 MCG/ACT nasal spray Place 1 spray into the nose daily.   Yes Historical Provider, MD  levofloxacin (LEVAQUIN) 750 MG tablet Take 1 tablet (750 mg total) by mouth daily. 04/07/11 04/17/11 Yes Kerri Perches, MD  lisinopril-hydrochlorothiazide (PRINZIDE,ZESTORETIC) 20-25 MG per tablet Take 1 tablet by mouth daily. 01/08/11  Yes Kerri Perches, MD  meloxicam (MOBIC) 15 MG tablet Take 1 tablet (15 mg total) by mouth daily. 01/08/11  Yes Kerri Perches, MD  methotrexate 2.5 MG tablet Take 6 tablets by mouth on Thursdays 01/08/11  Yes Kerri Perches, MD  promethazine (PHENERGAN) 12.5 MG tablet Take 12.5 mg by mouth every 6 (six) hours as needed. For nausea and vomiting   Yes Historical Provider, MD  rosuvastatin (CRESTOR) 10 MG tablet Take 1 tablet (10 mg total) by mouth every other day. 01/08/11  Yes Kerri Perches, MD    Allergies:  Allergies  Allergen Reactions  . Aspirin     REACTION: burns stomach (uncoated)    Past Medical History: Past Medical History  Diagnosis Date  . Essential hypertension, benign   . Mixed hyperlipidemia   . Rheumatoid arthritis   . Osteoarthritis   .  Bradycardia     Sinus - noted 6/11  . Hypokalemia 12/14/2010  . HTN (hypertension) 12/14/2010  . ARF (acute renal failure) 12/14/2010  . Hypercalcemia 12/14/2010  . Anemia 12/14/2010  . Cholelithiasis 12/14/2010  . Collagen vascular disease   . Diastolic dysfunction 12/15/2010    Grade 1 per 2-D echocardiogram  . Acute cholecystitis 12/16/2010    Past Surgical History  Procedure Date  . Bilateral cataract surgery 2009  . L2-l3 discectomy 2002  . Right shoulder cyst excision 2010  .  L3-l4, l4-l5 lumbar decompression 2011  . Cholecystectomy 12/17/2010    Procedure: LAPAROSCOPIC CHOLECYSTECTOMY;  Surgeon: Fabio Bering;  Location: [facility];  Service: General;  Laterality: N/A;  . Spine surgery     Social History:  reports that she has never smoked. She has never used smokeless tobacco. She reports that she does not drink alcohol or use illicit drugs. her husband, however, does smoke quite heavily and, so, that is quite significant secondhand smoking.  Family History:  Family History  Problem Relation Age of Onset  . Stroke Mother   . Cancer Father   . Diabetes Sister   . Diabetes Sister   . Kidney failure Brother   . Kidney failure Brother     Review of Systems - History obtained from the patient General ROS: positive for  - fatigue Psychological ROS: negative Ophthalmic ROS: negative ENT ROS: negative Allergy and Immunology ROS: negative Hematological and Lymphatic ROS: negative Endocrine ROS: negative Respiratory ROS: positive for - cough Cardiovascular ROS: no chest pain or dyspnea on exertion Gastrointestinal ROS: as in hpi Genito-Urinary ROS: negative Musculoskeletal ROS: negative Neurological ROS: negative Dermatological ROS: negative  Physical Examination Blood pressure 134/74, pulse 71, temperature 97.9 F (36.6 C), temperature source Oral, resp. rate 20, height 5\' 2"  (1.575 m), weight 54.432 kg (120 lb), SpO2 100.00%.  General appearance: alert, cooperative, appears stated age and no distress Head: Normocephalic, without obvious abnormality, atraumatic Eyes: conjunctivae/corneas clear. PERRL, EOM's intact.  Throat: Dry. Mucous membranes were noted Neck: no adenopathy, no carotid bruit, no JVD, supple, symmetrical, trachea midline and thyroid not enlarged, symmetric, no tenderness/mass/nodules Resp: Few scattered wheezing, especially in the right lung. no crackles Cardio: regular rate and rhythm, S1, S2 normal, systolic murmur was appreciated  in the aortic area. No click, rub or gallop GI: soft, non-tender; bowel sounds normal; no masses,  no organomegaly. Umbilical hernia is present, which was easily reducible Extremities: extremities normal, atraumatic, no cyanosis or edema. Changes of rheumatoid arthritis were noted Pulses: 2+ and symmetric Skin: Skin color, texture, turgor normal. No rashes or lesions Lymph nodes: Cervical, supraclavicular, and axillary nodes normal. Neurologic: Grossly normal  Laboratory Data: Results for orders placed during the hospital encounter of 04/16/11 (from the past 48 hour(s))  CBC     Status: Abnormal   Collection Time   04/16/11  4:47 PM      Component Value Range Comment   WBC 4.5  4.0 - 10.5 (K/uL)    RBC 3.42 (*) 3.87 - 5.11 (MIL/uL)    Hemoglobin 10.8 (*) 12.0 - 15.0 (g/dL)    HCT 10.9 (*) 60.4 - 46.0 (%)    MCV 93.3  78.0 - 100.0 (fL)    MCH 31.6  26.0 - 34.0 (pg)    MCHC 33.9  30.0 - 36.0 (g/dL)    RDW 54.0 (*) 98.1 - 15.5 (%)    Platelets 155  150 - 400 (K/uL)   DIFFERENTIAL  Status: Normal   Collection Time   04/16/11  4:47 PM      Component Value Range Comment   Neutrophils Relative 50  43 - 77 (%)    Neutro Abs 2.3  1.7 - 7.7 (K/uL)    Lymphocytes Relative 38  12 - 46 (%)    Lymphs Abs 1.7  0.7 - 4.0 (K/uL)    Monocytes Relative 11  3 - 12 (%)    Monocytes Absolute 0.5  0.1 - 1.0 (K/uL)    Eosinophils Relative 1  0 - 5 (%)    Eosinophils Absolute 0.0  0.0 - 0.7 (K/uL)    Basophils Relative 0  0 - 1 (%)    Basophils Absolute 0.0  0.0 - 0.1 (K/uL)   COMPREHENSIVE METABOLIC PANEL     Status: Abnormal   Collection Time   04/16/11  4:47 PM      Component Value Range Comment   Sodium 140  135 - 145 (mEq/L)    Potassium 3.4 (*) 3.5 - 5.1 (mEq/L)    Chloride 99  96 - 112 (mEq/L)    CO2 30  19 - 32 (mEq/L)    Glucose, Bld 82  70 - 99 (mg/dL)    BUN 50 (*) 6 - 23 (mg/dL)    Creatinine, Ser 4.78 (*) 0.50 - 1.10 (mg/dL)    Calcium 29.5 (*) 8.4 - 10.5 (mg/dL)    Total Protein  7.3  6.0 - 8.3 (g/dL)    Albumin 4.0  3.5 - 5.2 (g/dL)    AST 20  0 - 37 (U/L)    ALT 9  0 - 35 (U/L)    Alkaline Phosphatase 68  39 - 117 (U/L)    Total Bilirubin 0.3  0.3 - 1.2 (mg/dL)    GFR calc non Af Amer 20 (*) >90 (mL/min)    GFR calc Af Amer 24 (*) >90 (mL/min)   PROTIME-INR     Status: Normal   Collection Time   04/16/11  4:47 PM      Component Value Range Comment   Prothrombin Time 15.0  11.6 - 15.2 (seconds)    INR 1.16  0.00 - 1.49    APTT     Status: Normal   Collection Time   04/16/11  4:47 PM      Component Value Range Comment   aPTT 28  24 - 37 (seconds)   URINALYSIS, ROUTINE W REFLEX MICROSCOPIC     Status: Abnormal   Collection Time   04/16/11  5:09 PM      Component Value Range Comment   Color, Urine YELLOW  YELLOW     APPearance CLEAR  CLEAR     Specific Gravity, Urine >1.030 (*) 1.005 - 1.030     pH 5.5  5.0 - 8.0     Glucose, UA NEGATIVE  NEGATIVE (mg/dL)    Hgb urine dipstick NEGATIVE  NEGATIVE     Bilirubin Urine NEGATIVE  NEGATIVE     Ketones, ur TRACE (*) NEGATIVE (mg/dL)    Protein, ur NEGATIVE  NEGATIVE (mg/dL)    Urobilinogen, UA 0.2  0.0 - 1.0 (mg/dL)    Nitrite NEGATIVE  NEGATIVE     Leukocytes, UA NEGATIVE  NEGATIVE  MICROSCOPIC NOT DONE ON URINES WITH NEGATIVE PROTEIN, BLOOD, LEUKOCYTES, NITRITE, OR GLUCOSE <1000 mg/dL.    Radiology Reports: No results found.  Electrocardiogram: Not done yet  Assessment/Plan  Principal Problem:  *ARF (acute renal failure) Active Problems:  Hypokalemia  HTN (hypertension)  Hypercalcemia  Anemia  Weight loss, non-intentional  Nausea & vomiting  Systolic murmur   #1 acute renal failure with dehydration: This is likely due to the fact, that she's had nausea, vomiting, for the last 3 weeks. With IV hydration this should improve.  #2 hypercalcemia: This is likely due to her dehydrated state. We will recheck her calcium levels when she is better hydrated and if it still remains persistently high  further evaluation may be necessary.  #3 nausea, vomiting, weight loss: Etiology for this remains unclear. Abdominal x-rays done 5 days ago, were unremarkable. LFTs are normal. She's not a diabetic. We will go ahead and check an EKG. We will get an ultrasound of her abdomen. She'll be given a trial of liquids tonight as she hasn't had any vomiting since this morning. If she continues to have symptoms GI evaluation may be necessary. We will also give her proton pump inhibitors  #4 hypokalemia: Will be repleted.  #5 systolic murmur in the aortic area. Encouraged the patient to discuss this further with her PCP.  #6 history of hypertension. Monitor her blood pressure closely. We will hold her ACE inhibitor and her diuretic because of the renal failure.  #7 history of anemia: This is been chronic since last year. Defer management to PCP.  Is a full code. DVT, prophylaxis will be initiated.  Further management decisions will depend on results of further testing and patient's response to treatment.  [facility]  [facility] Pager [phone]  04/16/2011, 7:36 PM

## 2011-04-16 NOTE — ED Notes (Signed)
Pt presents to ED per Dr simpson's office instructions to r/o dehydration. Pt states Has not "felt good in 3 weeks", N/V/D, treated with phenergan at home without success. Pt reports weakness, decreased appetite and weight loss.  Tenting skin, dry oral mucous and dry flaky skin noted.

## 2011-04-16 NOTE — ED Notes (Signed)
Sent in from Dr [employer] office for dehydration, recent weight loss, history of nausea and vomiting

## 2011-04-16 NOTE — ED Provider Notes (Signed)
History     CSN: [id number]  Arrival date & time 04/16/11  1337   First MD Initiated Contact with Patient 04/16/11 1623      Chief Complaint  Patient presents with  . Dehydration    (Consider location/radiation/quality/duration/timing/severity/associated sxs/prior treatment) HPI Patient with nausea and vomiting began three weeks ago.  Patient had labs checked at Dr. Anthony Sar office yesterday.  Dr. Lodema Hong called today and told patient she needed to come to hospital based on labs.  Patient denies fever, light headedness, but weak.  She denies abdominal pain or fever.  States she has lost 10 lbs in two months. Constipation with small amount of stool last night.   Past Medical History  Diagnosis Date  . Essential hypertension, benign   . Mixed hyperlipidemia   . Rheumatoid arthritis   . Osteoarthritis   . Bradycardia     Sinus - noted 6/11  . Hypokalemia 12/14/2010  . HTN (hypertension) 12/14/2010  . ARF (acute renal failure) 12/14/2010  . Hypercalcemia 12/14/2010  . Anemia 12/14/2010  . Cholelithiasis 12/14/2010  . Collagen vascular disease   . Diastolic dysfunction 12/15/2010    Grade 1 per 2-D echocardiogram  . Acute cholecystitis 12/16/2010    Past Surgical History  Procedure Date  . Bilateral cataract surgery 2009  . L2-l3 discectomy 2002  . Right shoulder cyst excision 2010  . L3-l4, l4-l5 lumbar decompression 2011  . Cholecystectomy 12/17/2010    Procedure: LAPAROSCOPIC CHOLECYSTECTOMY;  Surgeon: Fabio Bering;  Location: [facility];  Service: General;  Laterality: N/A;  . Spine surgery     Family History  Problem Relation Age of Onset  . Stroke Mother   . Cancer Father   . Diabetes Sister   . Diabetes Sister   . Kidney failure Brother   . Kidney failure Brother     History  Substance Use Topics  . Smoking status: Never Smoker   . Smokeless tobacco: Never Used   Comment: Second hand smoke   . Alcohol Use: No    OB History    Grav Para Term Preterm  Abortions TAB SAB Ect Mult Living                  Review of Systems  All other systems reviewed and are negative.    Allergies  Aspirin  Home Medications   Current Outpatient Rx  Name Route Sig Dispense Refill  . ACYCLOVIR 400 MG PO TABS Oral Take 1 tablet (400 mg total) by mouth 2 (two) times daily. 180 tablet 5  . BENZONATATE 100 MG PO CAPS Oral Take 1 capsule (100 mg total) by mouth every 6 (six) hours as needed for cough. 30 capsule 0  . DILTIAZEM HCL ER COATED BEADS 120 MG PO CP24 Oral Take 1 capsule (120 mg total) by mouth daily. 90 capsule 5  . FLUTICASONE PROPIONATE 50 MCG/ACT NA SUSP Nasal Place 1 spray into the nose daily.    Marland Kitchen LEVOFLOXACIN 750 MG PO TABS Oral Take 1 tablet (750 mg total) by mouth daily. 7 tablet 0  . LISINOPRIL-HYDROCHLOROTHIAZIDE 20-25 MG PO TABS Oral Take 1 tablet by mouth daily. 90 tablet 4  . MELOXICAM 15 MG PO TABS Oral Take 1 tablet (15 mg total) by mouth daily. 90 tablet 5  . METHOTREXATE SODIUM 2.5 MG PO TABS  Take 6 tablets by mouth on Thursdays 72 tablet 5  . PROMETHAZINE HCL 12.5 MG PO TABS Oral Take 12.5 mg by mouth every 6 (six) hours  as needed. For nausea and vomiting    . ROSUVASTATIN CALCIUM 10 MG PO TABS Oral Take 1 tablet (10 mg total) by mouth every other day. 90 tablet 4    BP 134/74  Pulse 71  Temp(Src) 97.9 F (36.6 C) (Oral)  Resp 20  Ht 5\' 2"  (1.575 m)  Wt 120 lb (54.432 kg)  BMI 21.95 kg/m2  SpO2 100%  Physical Exam  Nursing note and vitals reviewed. Constitutional: She appears well-developed and well-nourished.  HENT:  Head: Normocephalic and atraumatic.  Eyes: EOM are normal. Pupils are equal, round, and reactive to light.       Conjunctiva pale  Neck: Normal range of motion. Neck supple.  Cardiovascular: Normal rate, regular rhythm, normal heart sounds and intact distal pulses.   Pulmonary/Chest: Effort normal and breath sounds normal.  Abdominal: Soft. Bowel sounds are normal.  Musculoskeletal: Normal range of  motion.  Neurological: She is alert.  Skin: Skin is warm and dry.  Psychiatric: She has a normal mood and affect. Thought content normal.    ED Course  Procedures (including critical care time)  Labs Reviewed - No data to display No results found.   No diagnosis found.    MDM   Results for orders placed during the hospital encounter of 04/16/11  CBC      Component Value Range   WBC 4.5  4.0 - 10.5 (K/uL)   RBC 3.42 (*) 3.87 - 5.11 (MIL/uL)   Hemoglobin 10.8 (*) 12.0 - 15.0 (g/dL)   HCT 16.1 (*) 09.6 - 46.0 (%)   MCV 93.3  78.0 - 100.0 (fL)   MCH 31.6  26.0 - 34.0 (pg)   MCHC 33.9  30.0 - 36.0 (g/dL)   RDW 04.5 (*) 40.9 - 15.5 (%)   Platelets 155  150 - 400 (K/uL)  DIFFERENTIAL      Component Value Range   Neutrophils Relative 50  43 - 77 (%)   Neutro Abs 2.3  1.7 - 7.7 (K/uL)   Lymphocytes Relative 38  12 - 46 (%)   Lymphs Abs 1.7  0.7 - 4.0 (K/uL)   Monocytes Relative 11  3 - 12 (%)   Monocytes Absolute 0.5  0.1 - 1.0 (K/uL)   Eosinophils Relative 1  0 - 5 (%)   Eosinophils Absolute 0.0  0.0 - 0.7 (K/uL)   Basophils Relative 0  0 - 1 (%)   Basophils Absolute 0.0  0.0 - 0.1 (K/uL)  COMPREHENSIVE METABOLIC PANEL      Component Value Range   Sodium 140  135 - 145 (mEq/L)   Potassium 3.4 (*) 3.5 - 5.1 (mEq/L)   Chloride 99  96 - 112 (mEq/L)   CO2 30  19 - 32 (mEq/L)   Glucose, Bld 82  70 - 99 (mg/dL)   BUN 50 (*) 6 - 23 (mg/dL)   Creatinine, Ser 8.11 (*) 0.50 - 1.10 (mg/dL)   Calcium 91.4 (*) 8.4 - 10.5 (mg/dL)   Total Protein 7.3  6.0 - 8.3 (g/dL)   Albumin 4.0  3.5 - 5.2 (g/dL)   AST 20  0 - 37 (U/L)   ALT 9  0 - 35 (U/L)   Alkaline Phosphatase 68  39 - 117 (U/L)   Total Bilirubin 0.3  0.3 - 1.2 (mg/dL)   GFR calc non Af Amer 20 (*) >90 (mL/min)   GFR calc Af Amer 24 (*) >90 (mL/min)  URINALYSIS, ROUTINE W REFLEX MICROSCOPIC      Component Value  Range   Color, Urine YELLOW  YELLOW    APPearance CLEAR  CLEAR    Specific Gravity, Urine >1.030 (*) 1.005 -  1.030    pH 5.5  5.0 - 8.0    Glucose, UA NEGATIVE  NEGATIVE (mg/dL)   Hgb urine dipstick NEGATIVE  NEGATIVE    Bilirubin Urine NEGATIVE  NEGATIVE    Ketones, ur TRACE (*) NEGATIVE (mg/dL)   Protein, ur NEGATIVE  NEGATIVE (mg/dL)   Urobilinogen, UA 0.2  0.0 - 1.0 (mg/dL)   Nitrite NEGATIVE  NEGATIVE    Leukocytes, UA NEGATIVE  NEGATIVE   PROTIME-INR      Component Value Range   Prothrombin Time 15.0  11.6 - 15.2 (seconds)   INR 1.16  0.00 - 1.49   APTT      Component Value Range   aPTT 28  24 - 37 (seconds)   Patient with renal insufficiency due to volume depletion. She is also anemic with some decrease in her hemoglobin. Hemoccult is pending. Discussed with Dr. Karilyn Cota and patient will be admitted for volume repletion. She will be admitted to team 2.      Hilario Quarry, MD 04/16/11 2059

## 2011-04-17 ENCOUNTER — Inpatient Hospital Stay (HOSPITAL_COMMUNITY): Payer: Medicare Other

## 2011-04-17 LAB — CBC
HCT: 27.8 % — ABNORMAL LOW (ref 36.0–46.0)
Hemoglobin: 9.3 g/dL — ABNORMAL LOW (ref 12.0–15.0)
MCH: 31.4 pg (ref 26.0–34.0)
MCHC: 33.5 g/dL (ref 30.0–36.0)
RDW: 15.8 % — ABNORMAL HIGH (ref 11.5–15.5)

## 2011-04-17 LAB — TSH: TSH: 0.915 u[IU]/mL (ref 0.350–4.500)

## 2011-04-17 LAB — COMPREHENSIVE METABOLIC PANEL
ALT: 7 U/L (ref 0–35)
AST: 17 U/L (ref 0–37)
Alkaline Phosphatase: 54 U/L (ref 39–117)
CO2: 28 mEq/L (ref 19–32)
Calcium: 9.8 mg/dL (ref 8.4–10.5)
GFR calc Af Amer: 27 mL/min — ABNORMAL LOW (ref >90)
GFR calc non Af Amer: 23 mL/min — ABNORMAL LOW (ref >90)
Glucose, Bld: 88 mg/dL (ref 70–99)
Potassium: 3.7 mEq/L (ref 3.5–5.1)
Sodium: 140 mEq/L (ref 135–145)
Total Protein: 5.9 g/dL — ABNORMAL LOW (ref 6.0–8.3)

## 2011-04-17 MED ORDER — ACYCLOVIR 200 MG PO CAPS
400.0000 mg | ORAL_CAPSULE | Freq: Two times a day (BID) | ORAL | Status: DC
  Administered 2011-04-17 – 2011-04-18 (×3): 400 mg via ORAL
  Filled 2011-04-17 (×7): qty 2

## 2011-04-17 MED ORDER — SODIUM CHLORIDE 0.9 % IJ SOLN
INTRAMUSCULAR | Status: AC
  Administered 2011-04-17: 10 mL
  Filled 2011-04-17: qty 3

## 2011-04-17 MED ORDER — SODIUM CHLORIDE 0.9 % IJ SOLN
INTRAMUSCULAR | Status: AC
  Administered 2011-04-17: 20 mL
  Filled 2011-04-17: qty 3

## 2011-04-17 NOTE — Progress Notes (Signed)
   CARE MANAGEMENT NOTE 04/17/2011  Patient:  Stacy Meyers, Stacy Meyers   Account Number:  [id number]  Date Initiated:  04/17/2011  Documentation initiated by:  Rosemary Holms  Subjective/Objective Assessment:   Pt admitted from home. Admitted with dehydration and wants to go home today     Action/Plan:   Spoke to pt at bedside and discussed HH. Pt choose [facility] if HH ordered.   Anticipated DC Date:  04/17/2011   Anticipated DC Plan:  HOME/SELF CARE      DC Planning Services  CM consult      Choice offered to / List presented to:             Status of service:  In process, will continue to follow Medicare Important Message given?   (If response is "NO", the following Medicare IM given date fields will be blank) Date Medicare IM given:   Date Additional Medicare IM given:    Discharge Disposition:    Per UR Regulation:    If discussed at Long Length of Stay Meetings, dates discussed:    Comments:  04/17/11 1600 [place] Blyden Leanord Hawking RN BSN CM

## 2011-04-17 NOTE — Progress Notes (Signed)
UR Chart Review Completed  

## 2011-04-17 NOTE — Progress Notes (Signed)
Subjective: This lady was admitted with nausea and vomiting and was clinically and biochemically dehydrated. She has been started on intravenous fluids. She feels better. She wants to start a diet. She is due to have ultrasound of the abdomen. The etiology of her dehydration and symptoms and not entirely clear at this point. She does not appear to have a UTI. She had cholecystectomy, laparoscopically, in December 2012 and seemed to be doing well postoperatively. She has gotten sick in the last 3 weeks or so. She denies having any significant diarrhea.           Physical Exam: Blood pressure 104/68, pulse 66, temperature 98.2 F (36.8 C), temperature source Oral, resp. rate 20, height 5\' 2"  (1.575 m), weight 54.432 kg (120 lb), SpO2 96.00%. She does look systemically well at this point. She is not clinically dehydrated now. Abdomen is soft and really nontender. She does have an umbilical hernia which reduces easily. The scars secondary to her laparoscopic cholecystectomy appear to be noninfected. Heart sounds are present and normal. Lung fields are clear. She is alert and oriented.   Investigations:  Recent Results (from the past 240 hour(s))  URINE CULTURE     Status: Normal   Collection Time   04/10/11 11:20 AM      Component Value Range Status Comment   Colony Count NO GROWTH   Final    Organism ID, Bacteria NO GROWTH   Final      Basic Metabolic Panel:  Basename 04/17/11 0557 04/16/11 1647  NA 140 140  K 3.7 3.4*  CL 105 99  CO2 28 30  GLUCOSE 88 82  BUN 43* 50*  CREATININE 2.00* 2.21*  CALCIUM 9.8 11.4*  MG -- --  PHOS -- --   Liver Function Tests:  [facility] 04/17/11 0557 04/16/11 1647  AST 17 20  ALT 7 9  ALKPHOS 54 68  BILITOT 0.3 0.3  PROT 5.9* 7.3  ALBUMIN 3.1* 4.0     CBC:  Basename 04/17/11 0557 04/16/11 1647  WBC 4.6 4.5  NEUTROABS -- 2.3  HGB 9.3* 10.8*  HCT 27.8* 31.9*  MCV 93.9 93.3  PLT 147* 155        Medications: I have reviewed the  patient's current medications.  Impression: 1. Dehydration/acute renal failure, improving. 2. Nausea and vomiting, unclear source. 3. Hypercalcemia, improved. 4. Hypertension.      Plan: 1. Continue IV fluids. 2. Await ultrasound of the abdomen results. 3. Start a diet.     LOS: 1 day   Wilson Singer Pager [phone]  04/17/2011, 10:17 AM

## 2011-04-18 LAB — CBC
HCT: 27.8 % — ABNORMAL LOW (ref 36.0–46.0)
Hemoglobin: 9.5 g/dL — ABNORMAL LOW (ref 12.0–15.0)
MCH: 32 pg (ref 26.0–34.0)
MCHC: 34.2 g/dL (ref 30.0–36.0)
MCV: 93.6 fL (ref 78.0–100.0)
Platelets: 135 K/uL — ABNORMAL LOW (ref 150–400)
RBC: 2.97 MIL/uL — ABNORMAL LOW (ref 3.87–5.11)
RDW: 16 % — ABNORMAL HIGH (ref 11.5–15.5)
WBC: 5.6 K/uL (ref 4.0–10.5)

## 2011-04-18 LAB — COMPREHENSIVE METABOLIC PANEL
AST: 14 U/L (ref 0–37)
Albumin: 2.7 g/dL — ABNORMAL LOW (ref 3.5–5.2)
BUN: 29 mg/dL — ABNORMAL HIGH (ref 6–23)
Calcium: 9.5 mg/dL (ref 8.4–10.5)
Creatinine, Ser: 1.63 mg/dL — ABNORMAL HIGH (ref 0.50–1.10)
Total Protein: 5.3 g/dL — ABNORMAL LOW (ref 6.0–8.3)

## 2011-04-18 MED ORDER — SODIUM CHLORIDE 0.9 % IJ SOLN
INTRAMUSCULAR | Status: AC
  Administered 2011-04-18: 10 mL
  Filled 2011-04-18: qty 3

## 2011-04-18 MED ORDER — SODIUM CHLORIDE 0.9 % IJ SOLN
INTRAMUSCULAR | Status: AC
  Filled 2011-04-18: qty 3

## 2011-04-18 NOTE — Discharge Summary (Signed)
Physician Discharge Summary  Patient ID: Stacy Meyers MRN: [id number] DOB/AGE: April 05, 1932 78 y.o. Primary Care Physician:Margaret Lodema Hong, MD, MD Admit date: 04/16/2011 Discharge date: 04/18/2011    Discharge Diagnoses:  1. Dehydration/acute renal failure, improving. 2. Hypercalcemia secondary to dehydration resolved. 3. Anemia, will likely need outpatient investigation. No active bleeding. 4. Hypertension.    Medication List  As of 04/18/2011 10:40 AM   STOP taking these medications         levofloxacin 750 MG tablet      lisinopril-hydrochlorothiazide 20-25 MG per tablet      meloxicam 15 MG tablet         TAKE these medications         acyclovir 400 MG tablet   Commonly known as: ZOVIRAX   Take 1 tablet (400 mg total) by mouth 2 (two) times daily.      benzonatate 100 MG capsule   Commonly known as: TESSALON   Take 1 capsule (100 mg total) by mouth every 6 (six) hours as needed for cough.      diltiazem 120 MG 24 hr capsule   Commonly known as: CARDIZEM CD   Take 1 capsule (120 mg total) by mouth daily.      fluticasone 50 MCG/ACT nasal spray   Commonly known as: FLONASE   Place 1 spray into the nose daily.      methotrexate 2.5 MG tablet   Take 6 tablets by mouth on Thursdays      promethazine 12.5 MG tablet   Commonly known as: PHENERGAN   Take 12.5 mg by mouth every 6 (six) hours as needed. For nausea and vomiting      rosuvastatin 10 MG tablet   Commonly known as: CRESTOR   Take 1 tablet (10 mg total) by mouth every other day.            Discharged Condition: Stable and improved.    Consults: None.  Significant Diagnostic Studies: Dg Chest 2 View  04/07/2011  *RADIOLOGY REPORT*  Clinical Data: Recurrent bronchitis for 2 months  CHEST - 2 VIEW  Comparison: Chest x-ray of 12/14/2010 and CT chest of the same date  Findings: No active infiltrate or effusion is seen.  Mediastinal contours are stable.  The heart is mildly enlarged and stable.  The  descending thoracic aorta is slightly ectatic.  No acute bony abnormality is seen.  IMPRESSION: Stable chest x-ray.  No active lung disease.  Stable borderline cardiomegaly.  Original Report Authenticated By: Juline Patch, M.D.   US Abdomen Complete  04/17/2011  *RADIOLOGY REPORT*  Clinical Data:  Nausea, vomiting  COMPLETE ABDOMINAL ULTRASOUND  Comparison:  12/14/2010  Findings:  Gallbladder:  Surgically absent  Common bile duct:  4.5 mm in diameter within normal limits.  Liver:  No focal lesion identified. A cyst is noted in the left hepatic lobe measures 1.6 x 2.1 x 1.7 cm.  IVC:  Appears normal.  Pancreas:  No focal abnormality seen.  Spleen:  Measures 6 cm in length.  Normal echogenicity.  Right Kidney:  Measures 9.3 cm in length. No hydronephrosis or diagnostic renal calculus again noted a cyst measures 1.7 x 1.5 cm on the prior exam measures 1.6 x 1.4 cm.  Left Kidney:  Measures 12.5 cm in length.  No hydronephrosis or diagnostic renal calculus.  Again noted left renal cyst in upper pole measures 2.2 x 1.7 cm on the prior exam measures 2 x 2.1 cm.  Abdominal aorta:  No aneurysm  identified. Measures up to 2.1 cm in diameter.  Again noted atherosclerotic calcifications.  IMPRESSION:  1.  Surgically absent gallbladder.  Normal CBD. 2.  Bilateral renal cyst again noted.  No hydronephrosis or diagnostic renal calculus. 3.  No aortic aneurysm.  Original Report Authenticated By: Natasha Mead, M.D.   Dg Abd 2 Views  04/10/2011  *RADIOLOGY REPORT*  Clinical Data: Abdominal pain with nausea and vomiting.  ABDOMEN - 2 VIEW  Comparison: Radiographs dated 06/18/2009  Findings: No free air or free fluid in the abdomen.  Bowel gas pattern is normal.  Calcified fibroid in the pelvis.  No acute osseous abnormality.  Mild arthritic changes of both hips and evidence of previous lower lumbar spine surgery.  Previous cholecystectomy.  Tiny calcifications in both kidneys.  IMPRESSION: No acute abnormalities of the abdomen.   Original Report Authenticated By: Gwynn Burly, M.D.    Lab Results: Basic Metabolic Panel:  Basename 04/18/11 0436 04/17/11 0557  NA 141 140  K 4.1 3.7  CL 109 105  CO2 25 28  GLUCOSE 121* 88  BUN 29* 43*  CREATININE 1.63* 2.00*  CALCIUM 9.5 9.8  MG -- --  PHOS -- --   Liver Function Tests:  [facility] 04/18/11 0436 04/17/11 0557  AST 14 17  ALT 6 7  ALKPHOS 51 54  BILITOT 0.2* 0.3  PROT 5.3* 5.9*  ALBUMIN 2.7* 3.1*     CBC:  Basename 04/18/11 0436 04/17/11 0557 04/16/11 1647  WBC 5.6 4.6 --  NEUTROABS -- -- 2.3  HGB 9.5* 9.3* --  HCT 27.8* 27.8* --  MCV 93.6 93.9 --  PLT 135* 147* --    Recent Results (from the past 240 hour(s))  URINE CULTURE     Status: Normal   Collection Time   04/10/11 11:20 AM      Component Value Range Status Comment   Colony Count NO GROWTH   Final    Organism ID, Bacteria NO GROWTH   Final      Hospital Course: This very pleasant 79 year old lady was admitted with symptoms of nausea with vomiting. She had lost 10 pounds in the last 2 months or so. When she came to the emergency room she was dehydrated and in acute renal failure. There was no obvious cause of the symptoms and simple intravenous fluids seem to have improved her significantly. She did get an ultrasound of the abdomen as she was complaining of some abdominal pain and this was unremarkable. In particular there was no evidence of hydronephrosis and no other intra-abdominal pathology. Today she is extremely well and wants to go home.  Discharge Exam: Blood pressure 114/66, pulse 61, temperature 98.1 F (36.7 C), temperature source Oral, resp. rate 20, height 5\' 2"  (1.575 m), weight 54.432 kg (120 lb), SpO2 96.00%. She looks systemically well. She looks much better hydrated. Heart sounds are present nontender abdomen is soft nontender. She has a reducible umbilical hernia. She is alert and oriented without focal neurological signs.  Disposition: Home. I've encouraged her to  make sure she drinks plenty of oral fluids when she does go home. She will see her primary care physician in due course.  Discharge Orders    Future Appointments: Provider: Department: Dept Phone: Center:   04/24/2011 10:00 AM Kerri Perches, MD [facility] [phone] [facility]   06/08/2011 9:30 AM Kerri Perches, MD [facility] [phone] [facility]     Future Orders Please Complete By Expires   Diet - low  sodium heart healthy      Increase activity slowly         Follow-up Information    Follow up with [name], MD .         Signed: Wilson Singer Pager [phone]  04/18/2011, 10:40 AM

## 2011-04-18 NOTE — Progress Notes (Signed)
Writer reviewed and discussed discharge medications and instructions, verbalized understanding.  Encouraged to call with any questions that may arise.  Pt took all belongings and staff member wheel chaired pt out in stable condition and in no distress.   Encouraged to make follow up appt with Dr. Lodema Meyers on Monday morning.

## 2011-04-24 ENCOUNTER — Ambulatory Visit (INDEPENDENT_AMBULATORY_CARE_PROVIDER_SITE_OTHER): Payer: Medicare Other | Admitting: Family Medicine

## 2011-04-24 ENCOUNTER — Telehealth: Payer: Self-pay | Admitting: Family Medicine

## 2011-04-24 ENCOUNTER — Encounter: Payer: Self-pay | Admitting: Family Medicine

## 2011-04-24 VITALS — BP 130/70 | HR 59 | Resp 15 | Ht 59.5 in | Wt 124.0 lb

## 2011-04-24 DIAGNOSIS — R112 Nausea with vomiting, unspecified: Secondary | ICD-10-CM | POA: Diagnosis not present

## 2011-04-24 DIAGNOSIS — I10 Essential (primary) hypertension: Secondary | ICD-10-CM | POA: Diagnosis not present

## 2011-04-24 DIAGNOSIS — J309 Allergic rhinitis, unspecified: Secondary | ICD-10-CM | POA: Diagnosis not present

## 2011-04-24 DIAGNOSIS — J302 Other seasonal allergic rhinitis: Secondary | ICD-10-CM

## 2011-04-24 DIAGNOSIS — E785 Hyperlipidemia, unspecified: Secondary | ICD-10-CM

## 2011-04-24 DIAGNOSIS — D649 Anemia, unspecified: Secondary | ICD-10-CM

## 2011-04-24 NOTE — Assessment & Plan Note (Signed)
Controlled, no change in medication  

## 2011-04-24 NOTE — Progress Notes (Signed)
  Subjective:    Patient ID: Stacy Meyers, female    DOB: August 08, 1932, 78 y.o.   MRN: [id number]  HPI Pt recently hospitalized with acute renal failure due to dehydration from recurrent nausea and vomiting. She feels much better, and has adjustment in medications, no longer on lisinopril/hctz, also mobic Pt noted to be anemic, was referred to dr Karilyn Cota , but prefers to see dr Darrick Penna so I will refer her there . Concerned about umbilical hernia, however easily reducible, so I have reassured her no surgical intervention needed. Appetite and energy are both good     Review of Systems Please advise patient Cholesterol is elevated.  They need to increase fresh or frozen fruit and vegetable intake.  Reduce red meats max twice weekly.  Increase white meat, baked, grilled or broiled e.g. Fish, [place] breast and chicken breast.   Denies recent fever or chills. Denies sinus pressure, nasal congestion, ear pain or sore throat. Denies chest congestion, productive cough or wheezing. Denies chest pains, palpitations and leg swelling Denies abdominal pain, nausea, vomiting,diarrhea or constipation.   Denies dysuria, frequency, hesitancy or incontinence. Chronic  joint pain, swelling and limitation in mobility. Denies headaches, seizures, numbness, or tingling. Denies depression, anxiety or insomnia. Denies skin break down or rash.       . Objective:   Physical Exam  Patient alert and oriented and in no cardiopulmonary distress.Marked improvement in appearance  HEENT: No facial asymmetry, EOMI, no sinus tenderness,  oropharynx pink and moist.  Neck supple no adenopathy.  Chest: Clear to auscultation bilaterally.  CVS: S1, S2 no murmurs, no S3.  ABD: Soft non tender. Bowel sounds normal.Paraumbilical hernia, easily reucible  Ext: No edema  MS: Adequate though reduced  ROM spine, shoulders, hips and knees.  Skin: Intact, no ulcerations or rash noted.  Psych: Good eye contact, normal  affect. Memory intact not anxious or depressed appearing.  CNS: CN 2-12 intact, power, tone and sensation normal throughout.       Assessment & Plan:

## 2011-04-24 NOTE — Assessment & Plan Note (Signed)
Improved significantly. 

## 2011-04-24 NOTE — Patient Instructions (Signed)
F/u first week in June. Please call if you need to see me before  Chem 7, non fasting on 05/04/2011   Chem 7 non fasting in June before visit.  Per your request, you are referred to Dr Darrick Penna re anemia, since your last colonoscopy was 8 years ago. Please cancel your appointment with Dr Karilyn Cota, since you prefer to see Dr Darrick Penna  Medications as per discharge summary list as we reviewed them

## 2011-04-24 NOTE — Assessment & Plan Note (Signed)
Hyperlipidemia:Low fat diet discussed and encouraged.   Continue crestor

## 2011-04-24 NOTE — Assessment & Plan Note (Signed)
Currently increased symptoms, on flonase

## 2011-04-27 ENCOUNTER — Telehealth: Payer: Self-pay | Admitting: Family Medicine

## 2011-04-27 NOTE — Telephone Encounter (Signed)
Pt has OV with NUR office °

## 2011-04-27 NOTE — Telephone Encounter (Signed)
This patient was in my work queue and already has OV scheduled with Dr Patty Sermons office on Wednesday.

## 2011-04-28 DIAGNOSIS — D649 Anemia, unspecified: Secondary | ICD-10-CM | POA: Diagnosis not present

## 2011-04-29 ENCOUNTER — Telehealth (INDEPENDENT_AMBULATORY_CARE_PROVIDER_SITE_OTHER): Payer: Self-pay | Admitting: *Deleted

## 2011-04-29 ENCOUNTER — Ambulatory Visit (INDEPENDENT_AMBULATORY_CARE_PROVIDER_SITE_OTHER): Payer: Medicare Other | Admitting: Internal Medicine

## 2011-04-29 ENCOUNTER — Other Ambulatory Visit (INDEPENDENT_AMBULATORY_CARE_PROVIDER_SITE_OTHER): Payer: Self-pay | Admitting: *Deleted

## 2011-04-29 ENCOUNTER — Encounter (INDEPENDENT_AMBULATORY_CARE_PROVIDER_SITE_OTHER): Payer: Self-pay | Admitting: Internal Medicine

## 2011-04-29 VITALS — BP 132/62 | HR 56 | Temp 97.7°F | Ht 62.0 in | Wt 124.4 lb

## 2011-04-29 DIAGNOSIS — R634 Abnormal weight loss: Secondary | ICD-10-CM

## 2011-04-29 DIAGNOSIS — R112 Nausea with vomiting, unspecified: Secondary | ICD-10-CM

## 2011-04-29 DIAGNOSIS — D649 Anemia, unspecified: Secondary | ICD-10-CM | POA: Diagnosis not present

## 2011-04-29 DIAGNOSIS — R1013 Epigastric pain: Secondary | ICD-10-CM

## 2011-04-29 DIAGNOSIS — R5383 Other fatigue: Secondary | ICD-10-CM

## 2011-04-29 DIAGNOSIS — R531 Weakness: Secondary | ICD-10-CM

## 2011-04-29 DIAGNOSIS — E860 Dehydration: Secondary | ICD-10-CM | POA: Diagnosis not present

## 2011-04-29 DIAGNOSIS — R6883 Chills (without fever): Secondary | ICD-10-CM

## 2011-04-29 DIAGNOSIS — R5381 Other malaise: Secondary | ICD-10-CM | POA: Diagnosis not present

## 2011-04-29 DIAGNOSIS — N19 Unspecified kidney failure: Secondary | ICD-10-CM

## 2011-04-29 MED ORDER — PEG-KCL-NACL-NASULF-NA ASC-C 100 G PO SOLR: 1.0000 | Freq: Once | ORAL | Status: DC

## 2011-04-29 NOTE — Progress Notes (Addendum)
Subjective:     Patient ID: Stacy Meyers, female   DOB: 09/06/1932, 78 y.o.   MRN: [id number]  HPI Referred by Dr. Lodema Hong for unintentional weight loss.  Since February she has lost about 6 pounds. Recent CBC  04/18/11 revealed H and H 9.5 and 27.8, MCV 93.6.  12/18/2011 H and H 7.9 and 23.6. In December she received 2 units of blood. Cholecystectomy in December. She denies prior hx of anemia. Her stools are brown in color. No melena or bright red rectal bleeding. She says her appetite is good since recent stay in hospital 2 weeks ago for acute renal failure and dehydration. No abdominal pain.  No dysphagia. No acid reflux. Last colonoscopy 2005 Dr. Elnoria Howard: Multiple large diverticula were seen in the ascending colon. No masses, polyps, inflammation, ulcerations or erosions were noted throughout the colon. 12/2010: Iron 13, TIBC 227, Ferritin `137 05/01/2011 Iron 33,UIBC211, TIBC 244, %Sat 14, Ferritin  Review of Systems see  hpi Current Outpatient Prescriptions  Medication Sig Dispense Refill  . acetaminophen (TYLENOL) 500 MG tablet Take 500 mg by mouth every 6 (six) hours as needed.      Marland Kitchen acyclovir (ZOVIRAX) 400 MG tablet Take 1 tablet (400 mg total) by mouth 2 (two) times daily.  180 tablet  5  . benzonatate (TESSALON PERLES) 100 MG capsule Take 1 capsule (100 mg total) by mouth every 6 (six) hours as needed for cough.  30 capsule  0  . fluticasone (FLONASE) 50 MCG/ACT nasal spray Place 1 spray into the nose daily.      . methotrexate 2.5 MG tablet Take 6 tablets by mouth on Thursdays  72 tablet  5  . promethazine (PHENERGAN) 12.5 MG tablet Take 12.5 mg by mouth every 6 (six) hours as needed. For nausea and vomiting      . rosuvastatin (CRESTOR) 10 MG tablet Take 1 tablet (10 mg total) by mouth every other day.  90 tablet  4  . diltiazem (CARDIZEM CD) 120 MG 24 hr capsule Take 1 capsule (120 mg total) by mouth daily.  90 capsule  5  . peg 3350 powder (MOVIPREP) SOLR Take 1 kit (100 g total) by  mouth once.  1 kit  0   Past Medical History  Diagnosis Date  . Essential hypertension, benign   . Mixed hyperlipidemia   . Rheumatoid arthritis   . Osteoarthritis   . Bradycardia     Sinus - noted 6/11  . Hypokalemia 12/14/2010  . HTN (hypertension) 12/14/2010  . ARF (acute renal failure) 12/14/2010  . Hypercalcemia 12/14/2010  . Anemia 12/14/2010  . Cholelithiasis 12/14/2010  . Collagen vascular disease   . Diastolic dysfunction 12/15/2010    Grade 1 per 2-D echocardiogram  . Acute cholecystitis 12/16/2010   Past Surgical History  Procedure Date  . Bilateral cataract surgery 2009  . L2-l3 discectomy 2002  . Right shoulder cyst excision 2010  . L3-l4, l4-l5 lumbar decompression 2011  . Cholecystectomy 12/17/2010    Procedure: LAPAROSCOPIC CHOLECYSTECTOMY;  Surgeon: Fabio Bering;  Location: [facility];  Service: General;  Laterality: N/A;  . Spine surgery    History   Social History  . Marital Status: Married    Spouse Name: N/A    Number of Children: 1  . Years of Education: N/A   Occupational History  . Retired     Therapist, music   Social History Main Topics  . Smoking status: Never Smoker   . Smokeless tobacco: Never Used  Comment: Second hand smoke   . Alcohol Use: No  . Drug Use: No  . Sexually Active: Not on file   Other Topics Concern  . Not on file   Social History Narrative  . No narrative on file   Family Status  Relation Status Death Age  . Mother Deceased   . Father Deceased   . Sister Alive   . Sister Alive   . Brother Deceased   . Brother Deceased    Allergies  Allergen Reactions  . Aspirin     REACTION: burns stomach (uncoated)       Objective:   Physical ExamAlert and oriented. Skin warm and dry. Oral mucosa is moist.   . Sclera anicteric, conjunctivae is pink. Thyroid not enlarged. No cervical lymphadenopathy. Lungs clear. Heart regular rate and rhythm.  Abdomen is soft. Bowel sounds are positive. No hepatomegaly. No abdominal masses  felt. No tenderness.  No edema to lower extremities. Patient is alert and oriented.  Filed Vitals:   04/29/11 1006  Height: 5\' 2"  (1.575 m)  Weight: 124 lb 6.4 oz (56.427 kg)        Assessment:    anemia. GI blood loss needs to be ruled. Colonic neoplasm needs to be ruled out.    Plan:   Iron, ferritin, TIBC Colonoscopy

## 2011-04-29 NOTE — Telephone Encounter (Signed)
Patient needs movi prep 

## 2011-04-30 LAB — CBC WITH DIFFERENTIAL/PLATELET
Basophils Absolute: 0 10*3/uL (ref 0.0–0.1)
Eosinophils Relative: 2 % (ref 0–5)
HCT: 30.9 % — ABNORMAL LOW (ref 36.0–46.0)
Lymphocytes Relative: 25 % (ref 12–46)
Lymphs Abs: 1.2 10*3/uL (ref 0.7–4.0)
MCV: 96.6 fL (ref 78.0–100.0)
Monocytes Absolute: 0.4 10*3/uL (ref 0.1–1.0)
RDW: 17 % — ABNORMAL HIGH (ref 11.5–15.5)
WBC: 4.6 10*3/uL (ref 4.0–10.5)

## 2011-05-01 LAB — FERRITIN

## 2011-05-01 LAB — IRON AND TIBC

## 2011-05-02 ENCOUNTER — Other Ambulatory Visit: Payer: Self-pay | Admitting: Family Medicine

## 2011-05-12 ENCOUNTER — Telehealth: Payer: Self-pay | Admitting: Family Medicine

## 2011-05-12 NOTE — Telephone Encounter (Signed)
Spoke with EMS, they need to contact oncology I have not seen her for years  Spoke with her mother expres

## 2011-05-18 ENCOUNTER — Encounter (INDEPENDENT_AMBULATORY_CARE_PROVIDER_SITE_OTHER): Payer: Self-pay

## 2011-05-20 ENCOUNTER — Encounter (HOSPITAL_COMMUNITY): Payer: Self-pay | Admitting: Pharmacy Technician

## 2011-05-21 MED ORDER — SODIUM CHLORIDE 0.45 % IV SOLN
Freq: Once | INTRAVENOUS | Status: AC
  Administered 2011-05-22: 10:00:00 via INTRAVENOUS

## 2011-05-22 ENCOUNTER — Encounter (HOSPITAL_COMMUNITY): Payer: Self-pay | Admitting: *Deleted

## 2011-05-22 ENCOUNTER — Encounter (HOSPITAL_COMMUNITY)
Admission: RE | Disposition: A | Discharge status: Home / Self Care | Payer: Self-pay | Source: Ambulatory Visit | Attending: Internal Medicine

## 2011-05-22 ENCOUNTER — Ambulatory Visit (HOSPITAL_COMMUNITY)
Admission: RE | Admit: 2011-05-22 | Discharge: 2011-05-22 | Disposition: A | Discharge status: Home / Self Care | Payer: Medicare Other | Source: Ambulatory Visit | Attending: Internal Medicine | Admitting: Internal Medicine

## 2011-05-22 DIAGNOSIS — K5730 Diverticulosis of large intestine without perforation or abscess without bleeding: Secondary | ICD-10-CM | POA: Insufficient documentation

## 2011-05-22 DIAGNOSIS — D175 Benign lipomatous neoplasm of intra-abdominal organs: Secondary | ICD-10-CM | POA: Insufficient documentation

## 2011-05-22 DIAGNOSIS — R634 Abnormal weight loss: Secondary | ICD-10-CM

## 2011-05-22 DIAGNOSIS — K644 Residual hemorrhoidal skin tags: Secondary | ICD-10-CM | POA: Diagnosis not present

## 2011-05-22 DIAGNOSIS — D509 Iron deficiency anemia, unspecified: Secondary | ICD-10-CM

## 2011-05-22 DIAGNOSIS — K6389 Other specified diseases of intestine: Secondary | ICD-10-CM

## 2011-05-22 DIAGNOSIS — D649 Anemia, unspecified: Secondary | ICD-10-CM

## 2011-05-22 HISTORY — PX: COLONOSCOPY: SHX5424

## 2011-05-22 SURGERY — COLONOSCOPY
Anesthesia: Moderate Sedation

## 2011-05-22 MED ORDER — MEPERIDINE HCL 50 MG/ML IJ SOLN
INTRAMUSCULAR | Status: DC | PRN
  Administered 2011-05-22 (×2): 25 mg via INTRAVENOUS

## 2011-05-22 MED ORDER — MEPERIDINE HCL 50 MG/ML IJ SOLN
INTRAMUSCULAR | Status: AC
  Filled 2011-05-22: qty 1

## 2011-05-22 MED ORDER — MIDAZOLAM HCL 5 MG/5ML IJ SOLN
INTRAMUSCULAR | Status: DC | PRN
  Administered 2011-05-22 (×2): 1 mg via INTRAVENOUS
  Administered 2011-05-22: 2 mg via INTRAVENOUS
  Administered 2011-05-22: 1 mg via INTRAVENOUS

## 2011-05-22 MED ORDER — STERILE WATER FOR IRRIGATION IR SOLN
Status: DC | PRN
  Administered 2011-05-22: 10:00:00

## 2011-05-22 MED ORDER — MIDAZOLAM HCL 5 MG/5ML IJ SOLN
INTRAMUSCULAR | Status: DC
Start: 2011-05-22 — End: 2011-05-22
  Filled 2011-05-22: qty 10

## 2011-05-22 NOTE — Op Note (Signed)
COLONOSCOPY PROCEDURE REPORT  PATIENT:  Stacy Meyers  MR#:  [id number] Birthdate:  [birth year], 78 y.o., female Endoscopist:  Dr. Malissa Hippo, MD Referred By:  Dr. [name], MD Procedure Date: 05/22/2011  Procedure:   Colonoscopy  Indications:  Patient is 78 year old African female who was recently evaluated for anorexia and weight loss and found to have anemia with low serum iron low saturation but normal serum ferritin. Her stool is guaiac-negative and there is no history of melena or rectal bleeding. She is actually feeling better and her appetite has returned. She is undergoing colonoscopy for diagnostic purposes. Her last exam was several years ago  Informed Consent:  The procedure and risks were reviewed with the patient and informed consent was obtained.  Medications:  Demerol 50 mg IV Versed 5 mg IV  Description of procedure:  After a digital rectal exam was performed, that colonoscope was advanced from the anus through the rectum and colon to the area of the cecum, ileocecal valve and appendiceal orifice. The cecum was deeply intubated. These structures were well-seen and photographed for the record. From the level of the cecum and ileocecal valve, the scope was slowly and cautiously withdrawn. The mucosal surfaces were carefully surveyed utilizing scope tip to flexion to facilitate fold flattening as needed. The scope was pulled down into the rectum where a thorough exam including retroflexion was performed.  Findings:   Prep satisfactory. Diverticula throughout the colon quite a few at ascending colon. Tortuous splenic flexure and some difficulty encountered in passing the scope across it. 20 mm submucosal lipoma at proximal transverse colon just distal to hepatic flexure. It was palpated with closed biopsy forceps. Normal rectal mucosa. Prominent hemorrhoids below the dentate line  Therapeutic/Diagnostic Maneuvers Performed:  None  Complications:  None  Cecal  Withdrawal Time:  7 minutes  Impression:  Examination performed to cecum. Pancolonic diverticulosis. Most of the diverticula are located at ascending colon. 20 mm submucosal lipoma at proximal transverse colon. External  hemorrhoids  Recommendations:  Standard instructions given. Since patient is feeling better we'll hold off further workup. She will have CBC next month at the time of office visit with Dr. Lodema Hong.  Fae Blossom U  05/22/2011 11:42 AM  CC: Dr. [name], MD, MD & Dr. Bonnetta Barry ref. provider found

## 2011-05-22 NOTE — Discharge Instructions (Signed)
Resume usual medications. High-fiber diet. No driving for 24 hours.  Colonoscopy Care After Read the instructions outlined below and refer to this sheet in the next few weeks. These discharge instructions provide you with general information on caring for yourself after you leave the hospital. Your doctor may also give you specific instructions. While your treatment has been planned according to the most current medical practices available, unavoidable complications occasionally occur. If you have any problems or questions after discharge, call your doctor. HOME CARE INSTRUCTIONS ACTIVITY:  You may resume your regular activity, but move at a slower pace for the next 24 hours.   Take frequent rest periods for the next 24 hours.   Walking will help get rid of the air and reduce the bloated feeling in your belly (abdomen).   No driving for 24 hours (because of the medicine (anesthesia) used during the test).   You may shower.   Do not sign any important legal documents or operate any machinery for 24 hours (because of the anesthesia used during the test).  NUTRITION:  Drink plenty of fluids.   You may resume your normal diet as instructed by your doctor.   Begin with a light meal and progress to your normal diet. Heavy or fried foods are harder to digest and may make you feel sick to your stomach (nauseated).   Avoid alcoholic beverages for 24 hours or as instructed.  MEDICATIONS:  You may resume your normal medications unless your doctor tells you otherwise.  WHAT TO EXPECT TODAY:  Some feelings of bloating in the abdomen.   Passage of more gas than usual.   Spotting of blood in your stool or on the toilet paper.  IF YOU HAD POLYPS REMOVED DURING THE COLONOSCOPY:  No aspirin products for 7 days or as instructed.   No alcohol for 7 days or as instructed.   Eat a soft diet for the next 24 hours.  FINDING OUT THE RESULTS OF YOUR TEST Not all test results are available  during your visit. If your test results are not back during the visit, make an appointment with your caregiver to find out the results. Do not assume everything is normal if you have not heard from your caregiver or the medical facility. It is important for you to follow up on all of your test results.  SEEK IMMEDIATE MEDICAL CARE IF:  You have more than a spotting of blood in your stool.   Your belly is swollen (abdominal distention).   You are nauseated or vomiting.   You have a fever.   You have abdominal pain or discomfort that is severe or gets worse throughout the day.   High Fiber Diet A high fiber diet changes your normal diet to include more whole grains, legumes, fruits, and vegetables. Changes in the diet involve replacing refined carbohydrates with unrefined foods. The calorie level of the diet is essentially unchanged. The Dietary Reference Intake (recommended amount) for adult males is 38 g per day. For adult females, it is 25 g per day. Pregnant and lactating women should consume 28 g of fiber per day. Fiber is the intact part of a plant that is not broken down during digestion. Functional fiber is fiber that has been isolated from the plant to provide a beneficial effect in the body. PURPOSE  Increase stool bulk.   Ease and regulate bowel movements.   Lower cholesterol.  INDICATIONS THAT YOU NEED MORE FIBER  Constipation and hemorrhoids.   Uncomplicated  diverticulosis (intestine condition) and irritable bowel syndrome.   Weight management.   As a protective measure against hardening of the arteries (atherosclerosis), diabetes, and cancer.  NOTE OF CAUTION If you have a digestive or bowel problem, ask your caregiver for advice before adding high fiber foods to your diet. Some of the following medical problems are such that a high fiber diet should not be used without consulting your caregiver:  Acute diverticulitis (intestine infection).   Partial small bowel  obstructions.   Complicated diverticular disease involving bleeding, rupture (perforation), or abscess (boil, furuncle).   Presence of autonomic neuropathy (nerve damage) or gastric paresis (stomach cannot empty itself).  GUIDELINES FOR INCREASING FIBER  Start adding fiber to the diet slowly. A gradual increase of about 5 more grams (2 slices of whole-wheat bread, 2 servings of most fruits or vegetables, or 1 bowl of high fiber cereal) per day is best. Too rapid an increase in fiber may result in constipation, flatulence, and bloating.   Drink enough water and fluids to keep your urine clear or pale yellow. Water, juice, or caffeine-free drinks are recommended. Not drinking enough fluid may cause constipation.   Eat a variety of high fiber foods rather than one type of fiber.   Try to increase your intake of fiber through using high fiber foods rather than fiber pills or supplements that contain small amounts of fiber.   The goal is to change the types of food eaten. Do not supplement your present diet with high fiber foods, but replace foods in your present diet.  INCLUDE A VARIETY OF FIBER SOURCES  Replace refined and processed grains with whole grains, canned fruits with fresh fruits, and incorporate other fiber sources. White rice, white breads, and most bakery goods contain little or no fiber.   Brown whole-grain rice, buckwheat oats, and many fruits and vegetables are all good sources of fiber. These include: broccoli, Brussels sprouts, cabbage, cauliflower, beets, sweet potatoes, white potatoes (skin on), carrots, tomatoes, eggplant, squash, berries, fresh fruits, and dried fruits.   Cereals appear to be the richest source of fiber. Cereal fiber is found in whole grains and bran. Bran is the fiber-rich outer coat of cereal grain, which is largely removed in refining. In whole-grain cereals, the bran remains. In breakfast cereals, the largest amount of fiber is found in those with "bran"  in their names. The fiber content is sometimes indicated on the label.   You may need to include additional fruits and vegetables each day.   In baking, for 1 cup white flour, you may use the following substitutions:   1 cup whole-wheat flour minus 2 tbs.    cup white flour plus  cup whole-wheat flour.  Diverticulosis Diverticulosis is a common condition that develops when small pouches (diverticula) form in the wall of the colon. The risk of diverticulosis increases with age. It happens more often in people who eat a low-fiber diet. Most individuals with diverticulosis have no symptoms. Those individuals with symptoms usually experience abdominal pain, constipation, or loose stools (diarrhea). HOME CARE INSTRUCTIONS  Increase the amount of fiber in your diet as directed by your caregiver or dietician. This may reduce symptoms of diverticulosis.  Your caregiver may recommend taking a dietary fiber supplement.  Drink at least 6 to 8 glasses of water each day to prevent constipation.  Try not to strain when you have a bowel movement.  Your caregiver may recommend avoiding nuts and seeds to prevent complications, although this is still  an uncertain benefit.  Only take over-the-counter or prescription medicines for pain, discomfort, or fever as directed by your caregiver.  FOODS WITH HIGH FIBER CONTENT INCLUDE: Fruits. Apple, peach, pear, tangerine, raisins, prunes.  Vegetables. Brussels sprouts, asparagus, broccoli, cabbage, carrot, cauliflower, romaine lettuce, spinach, summer squash, tomato, winter squash, zucchini.  Starchy Vegetables. Baked beans, kidney beans, lima beans, split peas, lentils, potatoes (with skin).  Grains. Whole wheat bread, brown rice, bran flake cereal, plain oatmeal, white rice, shredded wheat, bran muffins.  SEEK IMMEDIATE MEDICAL CARE IF:  You develop increasing pain or severe bloating.  You have an oral temperature above 102 F (38.9 C), not controlled by  medicine.  You develop vomiting or bowel movements that are bloody or black.  Document Released: 09/19/2003 Document Revised: 12/11/2010 Document Reviewed: 05/22/2009 [facility] Patient Information 2012 [place], [place].

## 2011-05-22 NOTE — H&P (Signed)
Stacy Meyers is an 78 y.o. female.   Chief Complaint: Patient is here for colonoscopy. HPI: Patient is 78 year old African female who was recently by Dr. Lodema Hong for anorexia weight loss and anemia. Stool has been guaiac negative. There is no history of melena or rectal bleeding. Patient's serum iron and saturation were low but ferritin was normal. She says she is feeling better. She believes she developed anorexia and weight loss secondary to bronchitis. She denies abdominal pain diarrhea or constipation. Family history is negative for colorectal carcinoma.  Past Medical History  Diagnosis Date  . Essential hypertension, benign   . Mixed hyperlipidemia   . Rheumatoid arthritis   . Osteoarthritis   . Bradycardia     Sinus - noted 6/11  . Hypokalemia 12/14/2010  . HTN (hypertension) 12/14/2010  . ARF (acute renal failure) 12/14/2010  . Hypercalcemia 12/14/2010  . Anemia 12/14/2010  . Cholelithiasis 12/14/2010  . Collagen vascular disease   . Diastolic dysfunction 12/15/2010    Grade 1 per 2-D echocardiogram  . Acute cholecystitis 12/16/2010    Past Surgical History  Procedure Date  . Bilateral cataract surgery 2009  . L2-l3 discectomy 2002  . Right shoulder cyst excision 2010  . L3-l4, l4-l5 lumbar decompression 2011  . Cholecystectomy 12/17/2010    Procedure: LAPAROSCOPIC CHOLECYSTECTOMY;  Surgeon: Fabio Bering;  Location: [facility];  Service: General;  Laterality: N/A;  . Spine surgery     Family History  Problem Relation Age of Onset  . Stroke Mother   . Cancer Father   . Diabetes Sister   . Diabetes Sister   . Kidney failure Brother   . Kidney failure Brother    Social History:  reports that she has never smoked. She has never used smokeless tobacco. She reports that she does not drink alcohol or use illicit drugs.  Allergies:  Allergies  Allergen Reactions  . Aspirin Other (See Comments)    REACTION: burns stomach (uncoated)    Medications Prior to Admission    Medication Sig Dispense Refill  . acetaminophen (TYLENOL) 500 MG tablet Take 1,000 mg by mouth every 6 (six) hours as needed. FOR PAIN      . acyclovir (ZOVIRAX) 400 MG tablet Take 1 tablet (400 mg total) by mouth 2 (two) times daily.  180 tablet  5  . diltiazem (CARDIZEM CD) 120 MG 24 hr capsule       . fluticasone (FLONASE) 50 MCG/ACT nasal spray Place 1 spray into the nose every morning.       . methotrexate 2.5 MG tablet Take 15 mg by mouth once a week. Take 6 tablets by mouth on Thursdays      . peg 3350 powder (MOVIPREP) SOLR Take 1 kit (100 g total) by mouth once.  1 kit  0  . promethazine (PHENERGAN) 12.5 MG tablet Take 12.5 mg by mouth every 6 (six) hours as needed. For nausea and vomiting      . rosuvastatin (CRESTOR) 10 MG tablet Take 1 tablet (10 mg total) by mouth every other day.  90 tablet  4  . trolamine salicylate (ASPERCREME) 10 % cream Apply 1 application topically 2 (two) times daily as needed. FOR ARTHRITIS        No results found for this or any previous visit (from the past 48 hour(s)). No results found.  ROS  Blood pressure 168/74, pulse 75, temperature 98.2 F (36.8 C), temperature source Oral, resp. rate 18, height 5\' 3"  (1.6 m), weight 123  lb (55.792 kg), SpO2 100.00%. Physical Exam  Constitutional: She appears well-developed and well-nourished.  HENT:  Mouth/Throat: Oropharynx is clear and moist.  Eyes: Conjunctivae are normal. No scleral icterus.  Neck: No thyromegaly present.  Cardiovascular: Normal rate, regular rhythm and normal heart sounds.   No murmur heard. Respiratory: Effort normal and breath sounds normal.  GI: Soft. She exhibits no distension and no mass. There is no tenderness.  Musculoskeletal: She exhibits no edema.  Lymphadenopathy:    She has no cervical adenopathy.  Neurological: She is alert.  Skin: Skin is warm and dry.     Assessment/Plan Anemia with low serum iron and saturation. Diagnostic colonoscopy  Kyonna Frier  U 05/22/2011, 10:38 AM

## 2011-05-27 ENCOUNTER — Encounter (HOSPITAL_COMMUNITY): Payer: Self-pay | Admitting: Internal Medicine

## 2011-06-08 ENCOUNTER — Encounter: Payer: Self-pay | Admitting: Family Medicine

## 2011-06-08 ENCOUNTER — Ambulatory Visit (INDEPENDENT_AMBULATORY_CARE_PROVIDER_SITE_OTHER): Payer: Medicare Other | Admitting: Family Medicine

## 2011-06-08 VITALS — HR 67 | Resp 16 | Ht 59.5 in | Wt 122.0 lb

## 2011-06-08 DIAGNOSIS — M25562 Pain in left knee: Secondary | ICD-10-CM | POA: Insufficient documentation

## 2011-06-08 DIAGNOSIS — I10 Essential (primary) hypertension: Secondary | ICD-10-CM

## 2011-06-08 DIAGNOSIS — M25569 Pain in unspecified knee: Secondary | ICD-10-CM

## 2011-06-08 DIAGNOSIS — E785 Hyperlipidemia, unspecified: Secondary | ICD-10-CM | POA: Diagnosis not present

## 2011-06-08 DIAGNOSIS — M069 Rheumatoid arthritis, unspecified: Secondary | ICD-10-CM | POA: Diagnosis not present

## 2011-06-08 MED ORDER — PREDNISONE (PAK) 5 MG PO TABS: 5.0000 mg | ORAL_TABLET | ORAL | Status: DC

## 2011-06-08 MED ORDER — METHYLPREDNISOLONE ACETATE 40 MG/ML IJ SUSP
40.0000 mg | Freq: Once | INTRAMUSCULAR | Status: AC
  Administered 2011-06-08: 40 mg via INTRAMUSCULAR

## 2011-06-08 MED ORDER — KETOROLAC TROMETHAMINE 30 MG/ML IJ SOLN: 30.0000 mg | Freq: Once | INTRAMUSCULAR | Status: DC

## 2011-06-08 MED ORDER — KETOROLAC TROMETHAMINE 60 MG/2ML IJ SOLN
30.0000 mg | Freq: Once | INTRAMUSCULAR | Status: AC
  Administered 2011-06-08: 30 mg via INTRAMUSCULAR

## 2011-06-08 MED ORDER — RANITIDINE HCL 150 MG PO TABS: 150.0000 mg | ORAL_TABLET | Freq: Every day | ORAL | Status: DC

## 2011-06-08 NOTE — Patient Instructions (Addendum)
F/u in 2 month  Injections today for knee pain and medication also sent in.  OK to take ONE alleve tablet once daily for the next 1 week also.   Please accept my condolence at your recent loss

## 2011-06-08 NOTE — Progress Notes (Signed)
  Subjective:    Patient ID: Stacy Meyers, female    DOB: November 20, 1932, 78 y.o.   MRN: [id number]  HPI The PT is here for follow up and re-evaluation of chronic medical conditions, medication management and review of any available recent lab and radiology data.  Preventive health is updated, specifically  Cancer screening and Immunization.   Questions or concerns regarding consultations or procedures which the PT has had in the interim are  addressed. The PT denies any adverse reactions to current medications since the last visit.  Recently unexpectedly lost her only daughter, and now her grandson 1 week later was hit by a car, she is devastated, but doing the best she can. C/o increased and uncontrolled bilateral knee pain, denies falls, wants injections today for this      Review of Systems See HPI Denies recent fever or chills. Denies sinus pressure, nasal congestion, ear pain or sore throat. Denies chest congestion, productive cough or wheezing. Denies chest pains, palpitations and leg swelling Denies abdominal pain, nausea, vomiting,diarrhea or constipation.   Denies dysuria, frequency, hesitancy or incontinence.  Denies headaches, seizures, numbness, or tingling.  Denies skin break down or rash.        Objective:   Physical Exam Patient alert and oriented and in no cardiopulmonary distress.  HEENT: No facial asymmetry, EOMI, no sinus tenderness,  oropharynx pink and moist.  Neck supple no adenopathy.  Chest: Clear to auscultation bilaterally.  CVS: S1, S2 no murmurs, no S3.  ABD: Soft non tender. Bowel sounds normal.  Ext: No edema  MS: Adequate ROM spine, shoulders, hips and reduced in the knees.  Skin: Intact, no ulcerations or rash noted.  Psych: Good eye contact, normal affect. Memory intact anxious and  depressed appearing.  CNS: CN 2-12 intact, power, tone and sensation normal throughout.        Assessment & Plan:

## 2011-06-15 ENCOUNTER — Other Ambulatory Visit: Payer: Self-pay | Admitting: Family Medicine

## 2011-06-15 DIAGNOSIS — Z139 Encounter for screening, unspecified: Secondary | ICD-10-CM

## 2011-06-16 ENCOUNTER — Ambulatory Visit (HOSPITAL_COMMUNITY)
Admission: RE | Admit: 2011-06-16 | Discharge: 2011-06-16 | Disposition: A | Discharge status: Home / Self Care | Payer: Medicare Other | Source: Ambulatory Visit | Attending: Family Medicine | Admitting: Family Medicine

## 2011-06-16 DIAGNOSIS — Z1231 Encounter for screening mammogram for malignant neoplasm of breast: Secondary | ICD-10-CM | POA: Diagnosis not present

## 2011-06-16 DIAGNOSIS — Z139 Encounter for screening, unspecified: Secondary | ICD-10-CM

## 2011-06-21 NOTE — Assessment & Plan Note (Signed)
Stable on chronic meds 

## 2011-06-21 NOTE — Assessment & Plan Note (Signed)
Increased and uncontrolled, injections in the office today

## 2011-06-21 NOTE — Assessment & Plan Note (Signed)
Controlled, no change in medication  

## 2011-06-27 ENCOUNTER — Other Ambulatory Visit: Payer: Self-pay | Admitting: Family Medicine

## 2011-08-10 ENCOUNTER — Encounter: Payer: Self-pay | Admitting: Family Medicine

## 2011-08-10 ENCOUNTER — Ambulatory Visit (INDEPENDENT_AMBULATORY_CARE_PROVIDER_SITE_OTHER): Payer: Medicare Other | Admitting: Family Medicine

## 2011-08-10 VITALS — BP 130/76 | HR 63 | Resp 18 | Ht 59.5 in | Wt 128.1 lb

## 2011-08-10 DIAGNOSIS — B009 Herpesviral infection, unspecified: Secondary | ICD-10-CM

## 2011-08-10 DIAGNOSIS — E785 Hyperlipidemia, unspecified: Secondary | ICD-10-CM

## 2011-08-10 DIAGNOSIS — I10 Essential (primary) hypertension: Secondary | ICD-10-CM | POA: Diagnosis not present

## 2011-08-10 DIAGNOSIS — M069 Rheumatoid arthritis, unspecified: Secondary | ICD-10-CM

## 2011-08-10 NOTE — Assessment & Plan Note (Signed)
On daily anti viral to reduce frequency.

## 2011-08-10 NOTE — Assessment & Plan Note (Signed)
Controlled, no change in medication DASH diet and commitment to daily physical activity for a minimum of 30 minutes discussed and encouraged, as a part of hypertension management. The importance of attaining a healthy weight is also discussed.  

## 2011-08-10 NOTE — Progress Notes (Signed)
  Subjective:    Patient ID: Stacy Meyers, female    DOB: 24-Dec-1932, 78 y.o.   MRN: [id number]  HPI The PT is here for follow up and re-evaluation of chronic medical conditions, medication management and review of any available recent lab and radiology data.  Preventive health is updated, specifically  Cancer screening and Immunization.   Questions or concerns regarding consultations or procedures which the PT has had in the interim are  addressed. The PT denies any adverse reactions to current medications since the last visit.  There are no new concerns.  There are no specific complaints       Review of Systems See HPI Denies recent fever or chills. Denies sinus pressure, nasal congestion, ear pain or sore throat. Denies chest congestion, productive cough or wheezing. Denies chest pains, palpitations and leg swelling Denies abdominal pain, nausea, vomiting,diarrhea or constipation.   Denies dysuria, frequency, hesitancy or incontinence. Denies joint pain, swelling and limitation in mobility. Denies headaches, seizures, numbness, or tingling. Denies depression, anxiety or insomnia. Denies skin break down or rash.        Objective:   Physical Exam  Patient alert and oriented and in no cardiopulmonary distress.  HEENT: No facial asymmetry, EOMI, no sinus tenderness,  oropharynx pink and moist.  Neck supple no adenopathy.  Chest: Clear to auscultation bilaterally.  CVS: S1, S2 no murmurs, no S3.  ABD: Soft non tender. Bowel sounds normal.  Ext: No edema  MS: Adequate ROM spine, shoulders, hips and knees.  Skin: Intact, no ulcerations or rash noted.  Psych: Good eye contact, normal affect. Memory intact not anxious or depressed appearing.  CNS: CN 2-12 intact, power, tone and sensation normal throughout.      Assessment & Plan:

## 2011-08-10 NOTE — Assessment & Plan Note (Signed)
Recent flare affecting right ankle, corrected with advil

## 2011-08-10 NOTE — Patient Instructions (Addendum)
Annual wellness in 3.5 months  Please call in October for your flu vaccine  No medication changes at this time  I am happy and thankful that you are much better  You need a shingles vaccine, you will get a script for this

## 2011-08-10 NOTE — Assessment & Plan Note (Signed)
Hyperlipidemia:Low fat diet discussed and encouraged.  Controlled, no change in medication Updated labs needed

## 2011-08-14 DIAGNOSIS — H52229 Regular astigmatism, unspecified eye: Secondary | ICD-10-CM | POA: Diagnosis not present

## 2011-08-14 DIAGNOSIS — H353190 Nonexudative age-related macular degeneration, unspecified eye, stage unspecified: Secondary | ICD-10-CM | POA: Diagnosis not present

## 2011-08-14 DIAGNOSIS — H524 Presbyopia: Secondary | ICD-10-CM | POA: Diagnosis not present

## 2011-08-14 DIAGNOSIS — H5200 Hypermetropia, unspecified eye: Secondary | ICD-10-CM | POA: Diagnosis not present

## 2011-09-20 ENCOUNTER — Other Ambulatory Visit: Payer: Self-pay | Admitting: Family Medicine

## 2011-10-07 ENCOUNTER — Other Ambulatory Visit: Payer: Self-pay | Admitting: Family Medicine

## 2011-11-16 ENCOUNTER — Encounter: Payer: Self-pay | Admitting: Family Medicine

## 2011-11-16 ENCOUNTER — Ambulatory Visit (INDEPENDENT_AMBULATORY_CARE_PROVIDER_SITE_OTHER): Payer: Medicare Other | Admitting: Family Medicine

## 2011-11-16 VITALS — BP 120/72 | HR 67 | Resp 15 | Ht 59.5 in | Wt 130.1 lb

## 2011-11-16 DIAGNOSIS — M069 Rheumatoid arthritis, unspecified: Secondary | ICD-10-CM

## 2011-11-16 DIAGNOSIS — J40 Bronchitis, not specified as acute or chronic: Secondary | ICD-10-CM | POA: Diagnosis not present

## 2011-11-16 MED ORDER — ALBUTEROL SULFATE HFA 108 (90 BASE) MCG/ACT IN AERS: 2.0000 | INHALATION_SPRAY | RESPIRATORY_TRACT | Status: DC | PRN

## 2011-11-16 MED ORDER — AZITHROMYCIN 250 MG PO TABS: ORAL_TABLET | ORAL | Status: AC

## 2011-11-16 MED ORDER — PREDNISONE 10 MG PO TABS: ORAL_TABLET | ORAL | Status: DC

## 2011-11-16 NOTE — Progress Notes (Signed)
  Subjective:    Patient ID: Stacy Meyers, female    DOB: September 24, 1932, 78 y.o.   MRN: [id number]  HPI  Patient presents with a cough with congestion for the past week. She's a history of bronchitis requiring hospitalization one year ago. She has no history of COPD or asthma and is a nonsmoker. Cough is productive of white sputum. She's also had sneezing and occasional wheezing. She albuterol inhaler a  couple of times but needs this refilled. Her husband is also sick. She tried over-the-counter Alka-Seltzer.   She also thinks her methotrexate is not working for her rheumatoid arthritis. Evidently she's been on this for a few years and is no longer an active followup with rheumatology. She states that she occasionally uses Tylenol and takes an Aleve once in a while for her pain which helps significantly.  Review of Systems  GEN- denies fatigue, fever, weight loss,weakness, recent illness HEENT- denies eye drainage, change in vision, nasal discharge CVS- denies chest pain, palpitations RESP- denies SOB, +cough,+ wheeze ABD- denies N/V, change in stools, abd pain GU- denies dysuria, hematuria, dribbling, incontinence MSK- + joint pain, +muscle aches, injury Neuro- denies headache, dizziness, syncope, seizure activity      Objective:   Physical Exam GEN- NAD, alert and oriented x3 HEENT- PERRL, EOMI, non injected sclera, pink conjunctiva, MMM, non injected, TM clear bilat no effusion, no maxillary sinus tenderness, ,  Clear Nasal drainage  Neck- Supple, no LAD CVS- RRR, no murmur RESP-mild rhonchi, few bilateral wheeze, normal WOB, speaking in full sentences, no retractions ABD-NABS,soft,NT,ND  EXT- No edema Pulses- Radial 2+         Assessment & Plan:

## 2011-11-16 NOTE — Patient Instructions (Signed)
I am treating for bronchitis  Take the antibiotics- Zpak Prednisone 1 tablet twice a day for 5 days  Use the inhaler at bedtime, and then every 4 hours as needed during the day  Continue your other medications Call if you do not improve Keep previous f/u appointment

## 2011-11-16 NOTE — Assessment & Plan Note (Signed)
I think with her current illness she feels her arthritis may be worse, explained how illness can cause more muscle aches and joint pains. i advised her to continue MTX, her previous visits she had been doing well. She can use Aleve for short period of time, otherwise acetaminophen. She has f/u with PCP in December

## 2011-11-16 NOTE — Assessment & Plan Note (Signed)
Antibiotics, low dose prednisone, albuterol inhaler, no signs of respiratory distress or hypoxia

## 2011-12-10 ENCOUNTER — Ambulatory Visit (INDEPENDENT_AMBULATORY_CARE_PROVIDER_SITE_OTHER): Payer: Medicare Other | Admitting: Family Medicine

## 2011-12-10 ENCOUNTER — Encounter: Payer: Self-pay | Admitting: Family Medicine

## 2011-12-10 VITALS — BP 128/70 | HR 54 | Resp 16 | Ht 59.5 in | Wt 134.0 lb

## 2011-12-10 DIAGNOSIS — M069 Rheumatoid arthritis, unspecified: Secondary | ICD-10-CM

## 2011-12-10 DIAGNOSIS — Z0000 Encounter for general adult medical examination without abnormal findings: Secondary | ICD-10-CM

## 2011-12-10 DIAGNOSIS — R5381 Other malaise: Secondary | ICD-10-CM

## 2011-12-10 DIAGNOSIS — D649 Anemia, unspecified: Secondary | ICD-10-CM | POA: Diagnosis not present

## 2011-12-10 DIAGNOSIS — I10 Essential (primary) hypertension: Secondary | ICD-10-CM | POA: Diagnosis not present

## 2011-12-10 DIAGNOSIS — R7301 Impaired fasting glucose: Secondary | ICD-10-CM

## 2011-12-10 DIAGNOSIS — E785 Hyperlipidemia, unspecified: Secondary | ICD-10-CM | POA: Diagnosis not present

## 2011-12-10 MED ORDER — MELOXICAM 7.5 MG PO TABS: ORAL_TABLET | ORAL | Status: AC

## 2011-12-10 MED ORDER — FLUTICASONE PROPIONATE 50 MCG/ACT NA SUSP: 1.0000 | Freq: Every morning | NASAL | Status: DC

## 2011-12-10 MED ORDER — DILTIAZEM HCL ER COATED BEADS 120 MG PO CP24: ORAL_CAPSULE | ORAL | Status: DC

## 2011-12-10 NOTE — Patient Instructions (Addendum)
F/U in 4 month. Call if you need me before please.  Please get the flu vaccine at your pharmacy, you need this   After you finish this course of methotrexate, stop, use mobic one daily as needed for arthritis pain  Fasting lipid, cmp and cbc in 4 months, just before next visit

## 2011-12-10 NOTE — Progress Notes (Signed)
Subjective:    Patient ID: Stacy Meyers, female    DOB: 11-15-1932, 78 y.o.   MRN: [id number]  HPI Preventive Screening-Counseling & Management   Patient present here today for a Medicare annual wellness visit.   Current Problems (verified)   Medications Prior to Visit Allergies (verified)   PAST HISTORY  Family :history: 2sisters living one deceased. One sister had dementia, no know cancer in siblings, her daughter did have breast cancer  Social History Married, only child died in the past 12 months, unexpectedly, still adjusting to the loss , but overall coping well No  nicotine, alcohol or illicit drug use   Risk Factors  Current exercise habits:  walks approx 15 to 20  minutes at least 3 days per week, will try to increase this  Dietary issues discussed:low fat, low carb , low sodium diet rich in fruit and veg   Cardiac risk factors:   Depression Screen Grieving loss of her only daughter in may 2013, coping well using er faith (Note: if answer to either of the following is "Yes", a more complete depression screening is indicated)   Over the past two weeks, have you felt down, depressed or hopeless? No  Over the past two weeks, have you felt little interest or pleasure in doing things? No  Have you lost interest or pleasure in daily life? No  Do you often feel hopeless? No  Do you cry easily over simple problems? No   Activities of Daily Living  In your present state of health, do you have any difficulty performing the following activities?  Driving?: No Managing money?: No Feeding yourself?:No Getting from bed to chair?:sometimes has had back surgery Climbing a flight of stairs?:sometimes Preparing food and eating?:No Bathing or showering?:No Getting dressed?:No Getting to the toilet?:No Using the toilet?:No Moving around from place to place?: No  Fall Risk Assessment In the past year have you fallen or had a near fall?:No Are you currently taking any  medications that make you dizziness?:No   Hearing Difficulties: No Do you often ask people to speak up or repeat themselves?:No Do you experience ringing or noises in your ears?:No Do you have difficulty understanding soft or whispered voices?:No  Cognitive Testing  Alert? Yes Normal Appearance?Yes  Oriented to person? Yes Place? Yes  Time? Yes  Displays appropriate judgment?Yes  Can read the correct time from a watch face? yes Are you having problems remembering things?No  Advanced Directives have been discussed with the patient?Yes , full code but no interest in prolonging support if brain dead or extremely poor prognosis   List the Names of Other Physician/Practitioners you currently use: Dr Charise Killian, Dr Donato Heinz dentist     Assessment:    Annual Wellness Exam   Plan:    During the course of the visit the patient was educated and counseled about appropriate screening and preventive services including:  A healthy diet is rich in fruit, vegetables and whole grains. Poultry fish, nuts and beans are a healthy choice for protein rather then red meat. A low sodium diet and drinking 64 ounces of water daily is generally recommended. Oils and sweet should be limited. Carbohydrates especially for those who are diabetic or overweight, should be limited to 30-45 gram per meal. It is important to eat on a regular schedule, at least 3 times daily. Snacks should be primarily fruits, vegetables or nuts. It is important that you exercise regularly at least 30 minutes 5 times a week. If you develop chest  pain, have severe difficulty breathing, or feel very tired, stop exercising immediately and seek medical attention  Immunization reviewed and updated. Cancer screening reviewed and updated    Patient Instructions (the written plan) was given to the patient.  Medicare Attestation  I have personally reviewed:  The patient's medical and social history  Their use of alcohol, tobacco or illicit  drugs  Their current medications and supplements  The patient's functional ability including ADLs,fall risks, home safety risks, cognitive, and hearing and visual impairment  Diet and physical activities  Evidence for depression or mood disorders  The patient's weight, height, BMI, and visual acuity have been recorded in the chart. I have made referrals, counseling, and provided education to the patient based on review of the above and I have provided the patient with a written personalized care plan for preventive services.   Dr. Charise Killian   Review of Systems     Objective:   Physical Exam        Assessment & Plan:

## 2011-12-27 DIAGNOSIS — Z0000 Encounter for general adult medical examination without abnormal findings: Secondary | ICD-10-CM | POA: Insufficient documentation

## 2011-12-27 NOTE — Assessment & Plan Note (Signed)
Annual wellnes exam completed as documented. Pt has a high level of function , small risk is slightly increased due to orthopedic issues, several back surgeries in the past. Memory and depression screen are normal Pt is cognizant of good health habits and is interested in maintaining good health for as long as possible. End of life issues have been discussed and she has done so in recent past when hospitalized

## 2011-12-28 ENCOUNTER — Other Ambulatory Visit: Payer: Self-pay | Admitting: Family Medicine

## 2012-01-25 ENCOUNTER — Other Ambulatory Visit: Payer: Self-pay | Admitting: Family Medicine

## 2012-02-04 ENCOUNTER — Other Ambulatory Visit: Payer: Self-pay

## 2012-02-04 MED ORDER — ALBUTEROL SULFATE HFA 108 (90 BASE) MCG/ACT IN AERS: 2.0000 | INHALATION_SPRAY | RESPIRATORY_TRACT | Status: DC | PRN

## 2012-03-23 IMAGING — MG MM DIGITAL SCREENING
4 series · 4 of 4 positions shown · non-contrast
Comparison: none

DG SCREEN MAMMOGRAM BILATERAL
Bilateral CC and MLO view(s) were taken.
Technologist: [REDACTED]

DIGITAL SCREENING MAMMOGRAM WITH CAD:
There are scattered fibroglandular densities.  No masses or malignant type calcifications are 
identified.  Compared with prior studies.
Images were processed with CAD.

[L CC]
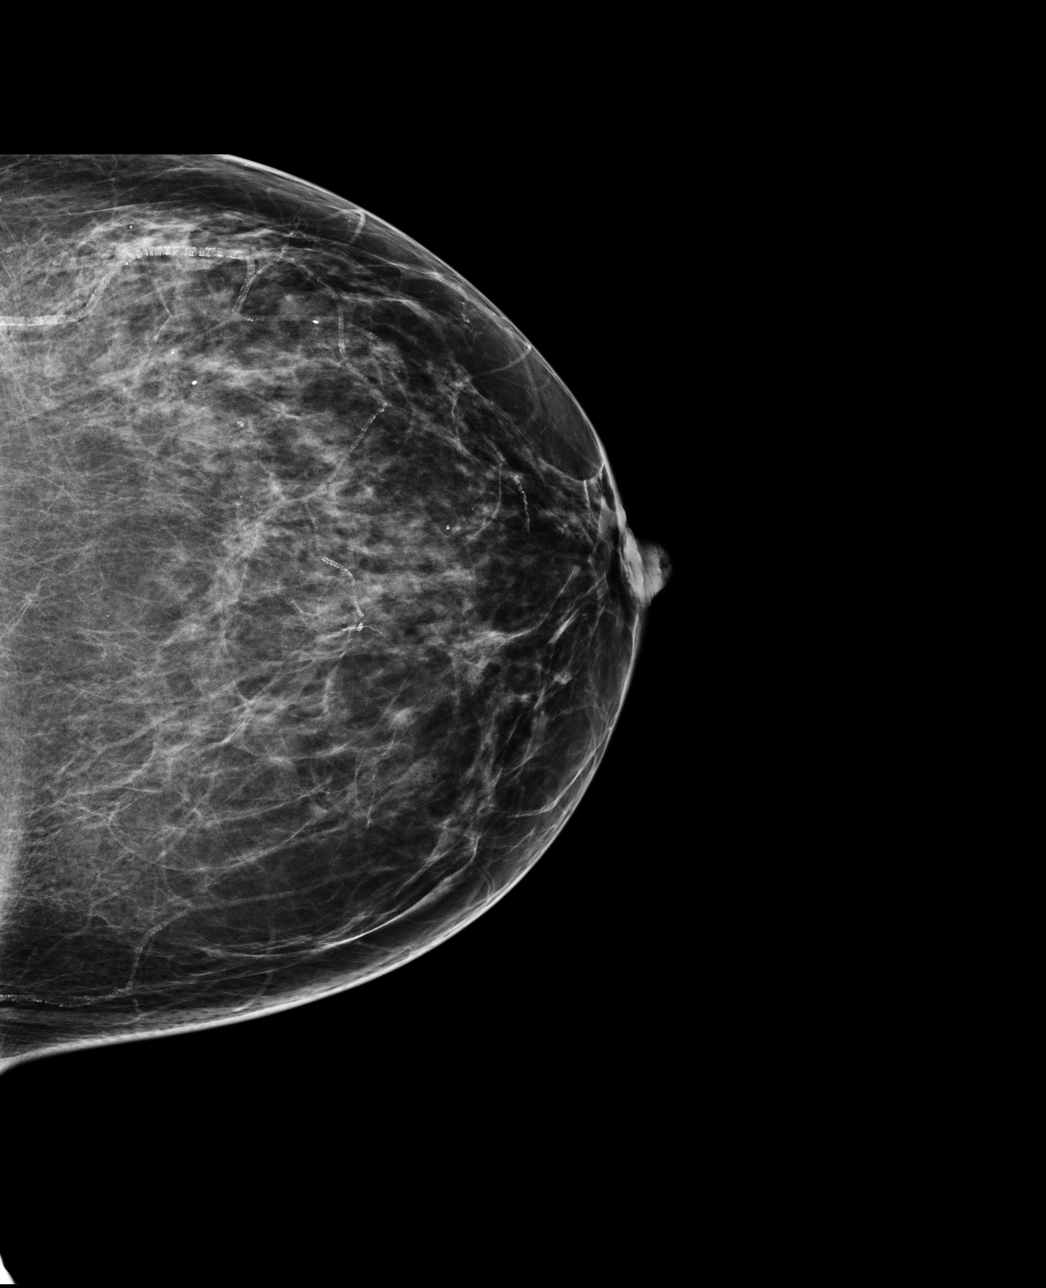

[L MLO]
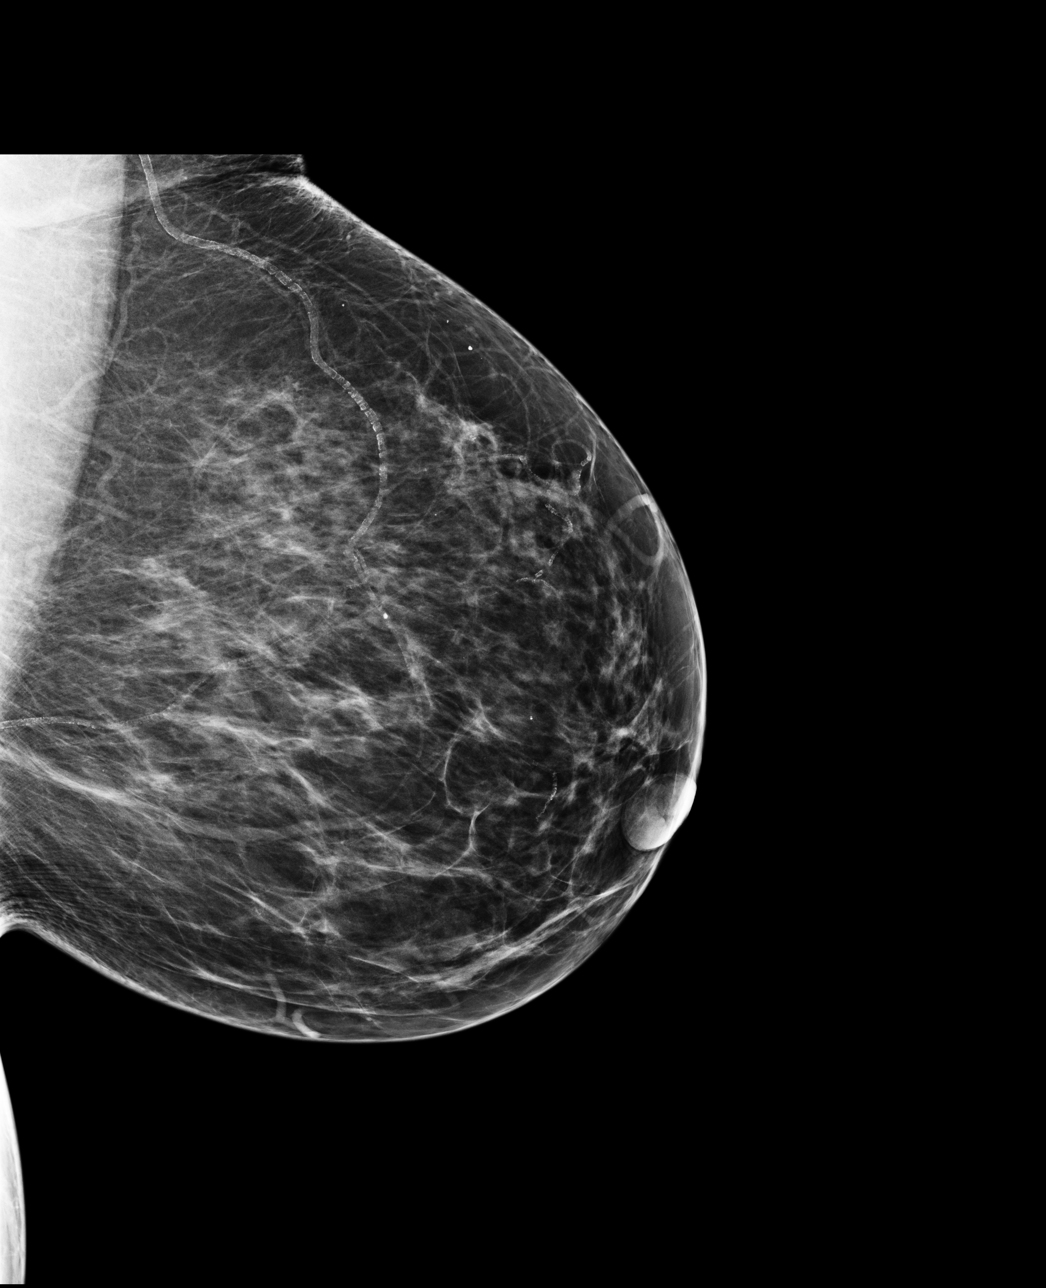

[R CC]
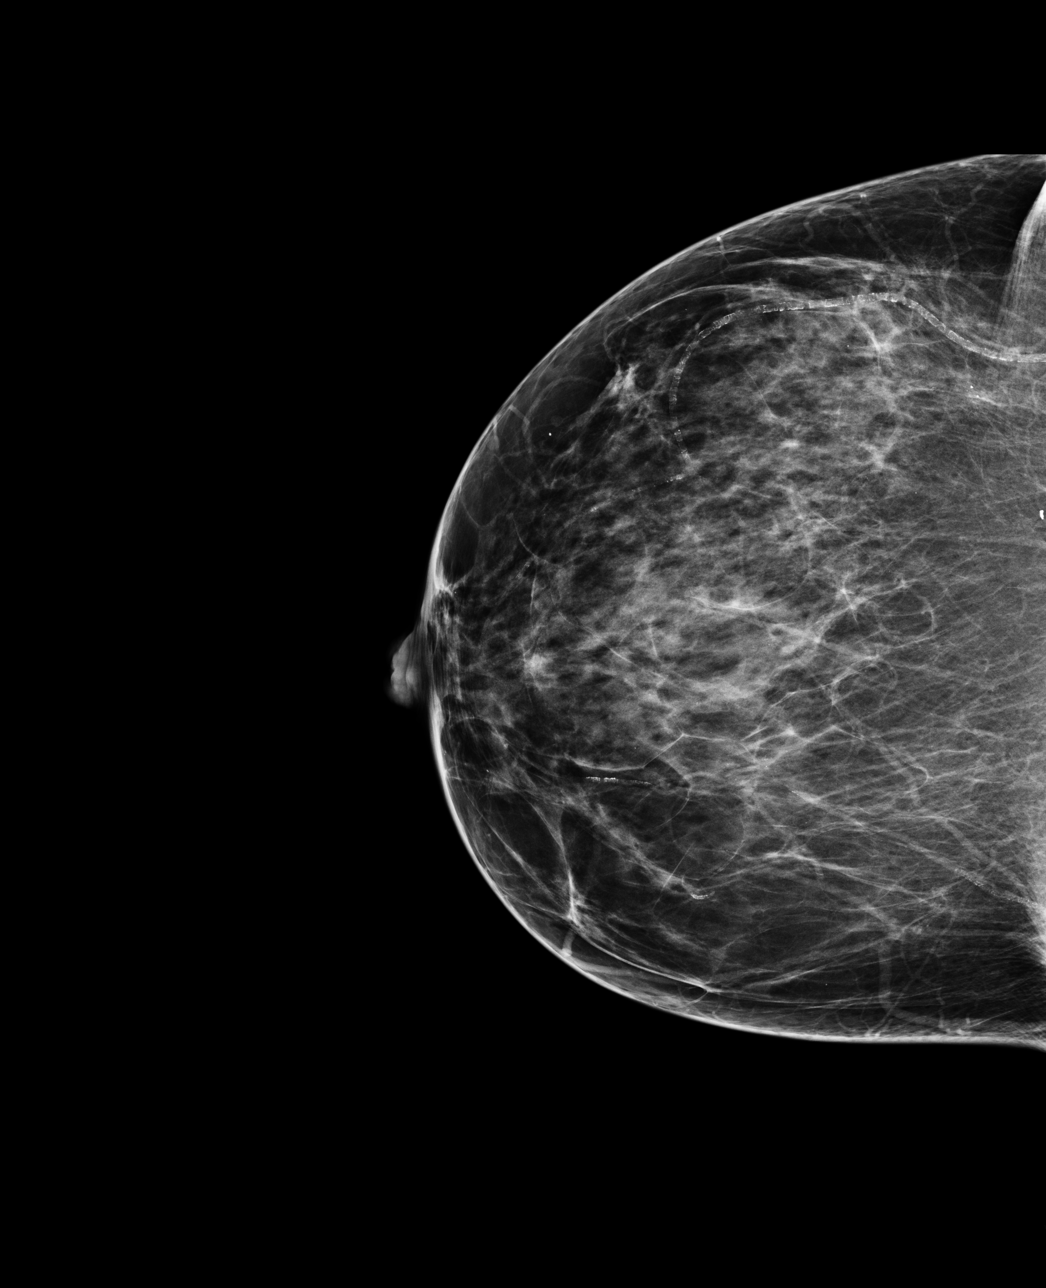

[R MLO]
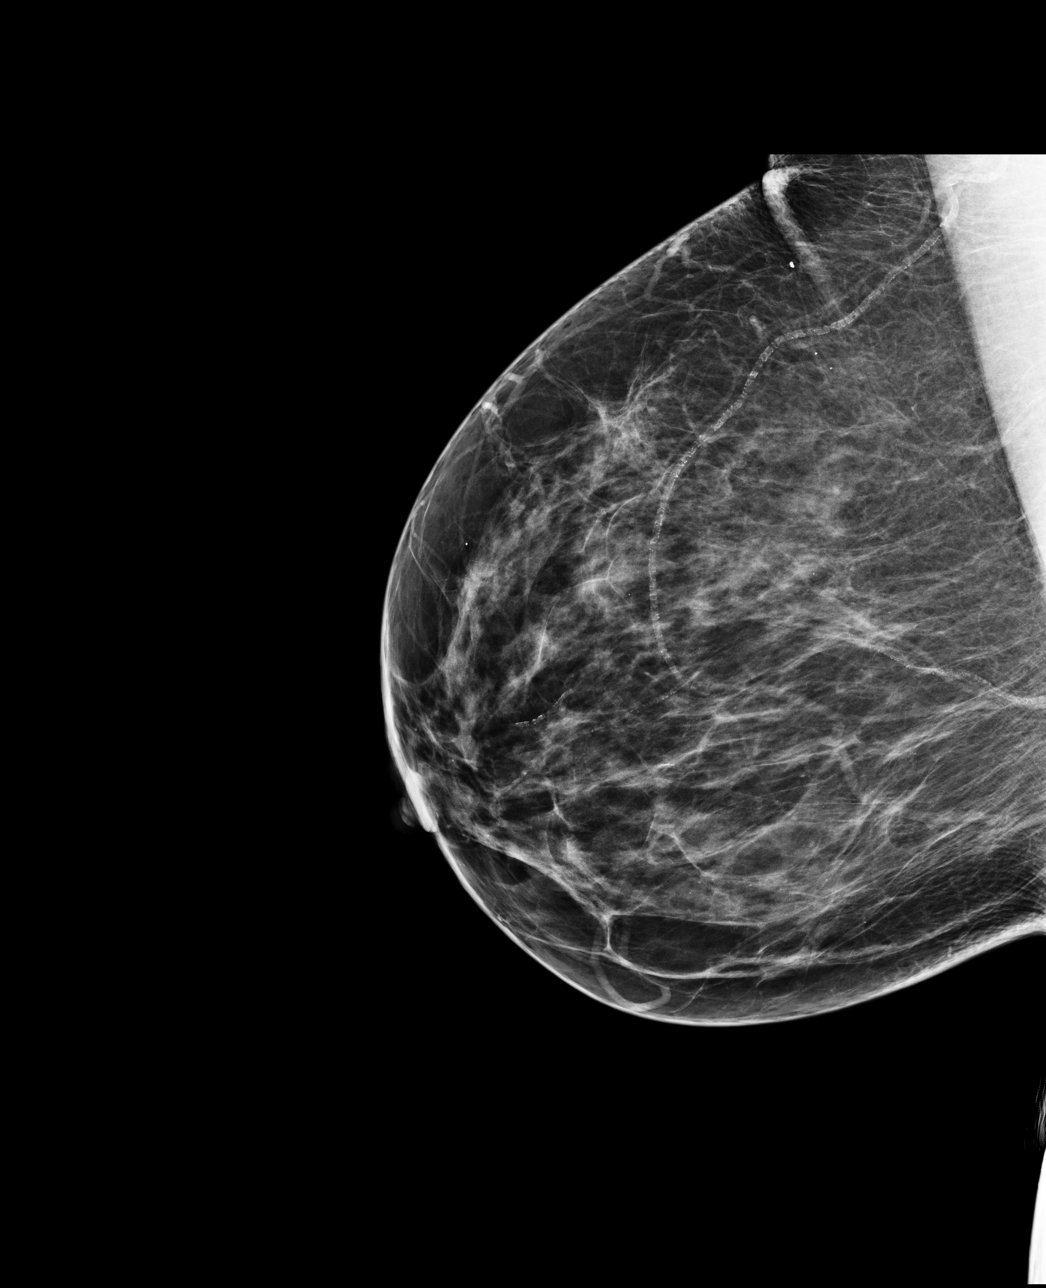

[4 of 4 positions shown; findings below may reference images not displayed]

IMPRESSION: No specific mammographic evidence of malignancy.  Next screening mammogram is recommended in one 
year.

A result letter of this screening mammogram will be mailed directly to the patient.

ASSESSMENT: Negative - BI-RADS 1

Screening mammogram in 1 year.
,

## 2012-03-23 IMAGING — CR DG CHEST 2V
2 series · 2 of 2 positions shown · non-contrast
Comparison: PA and lateral chest 08/27/2008.

CLINICAL DATA: Hypertension.

CHEST - 2 VIEW

[view not recorded (1 of 2)]
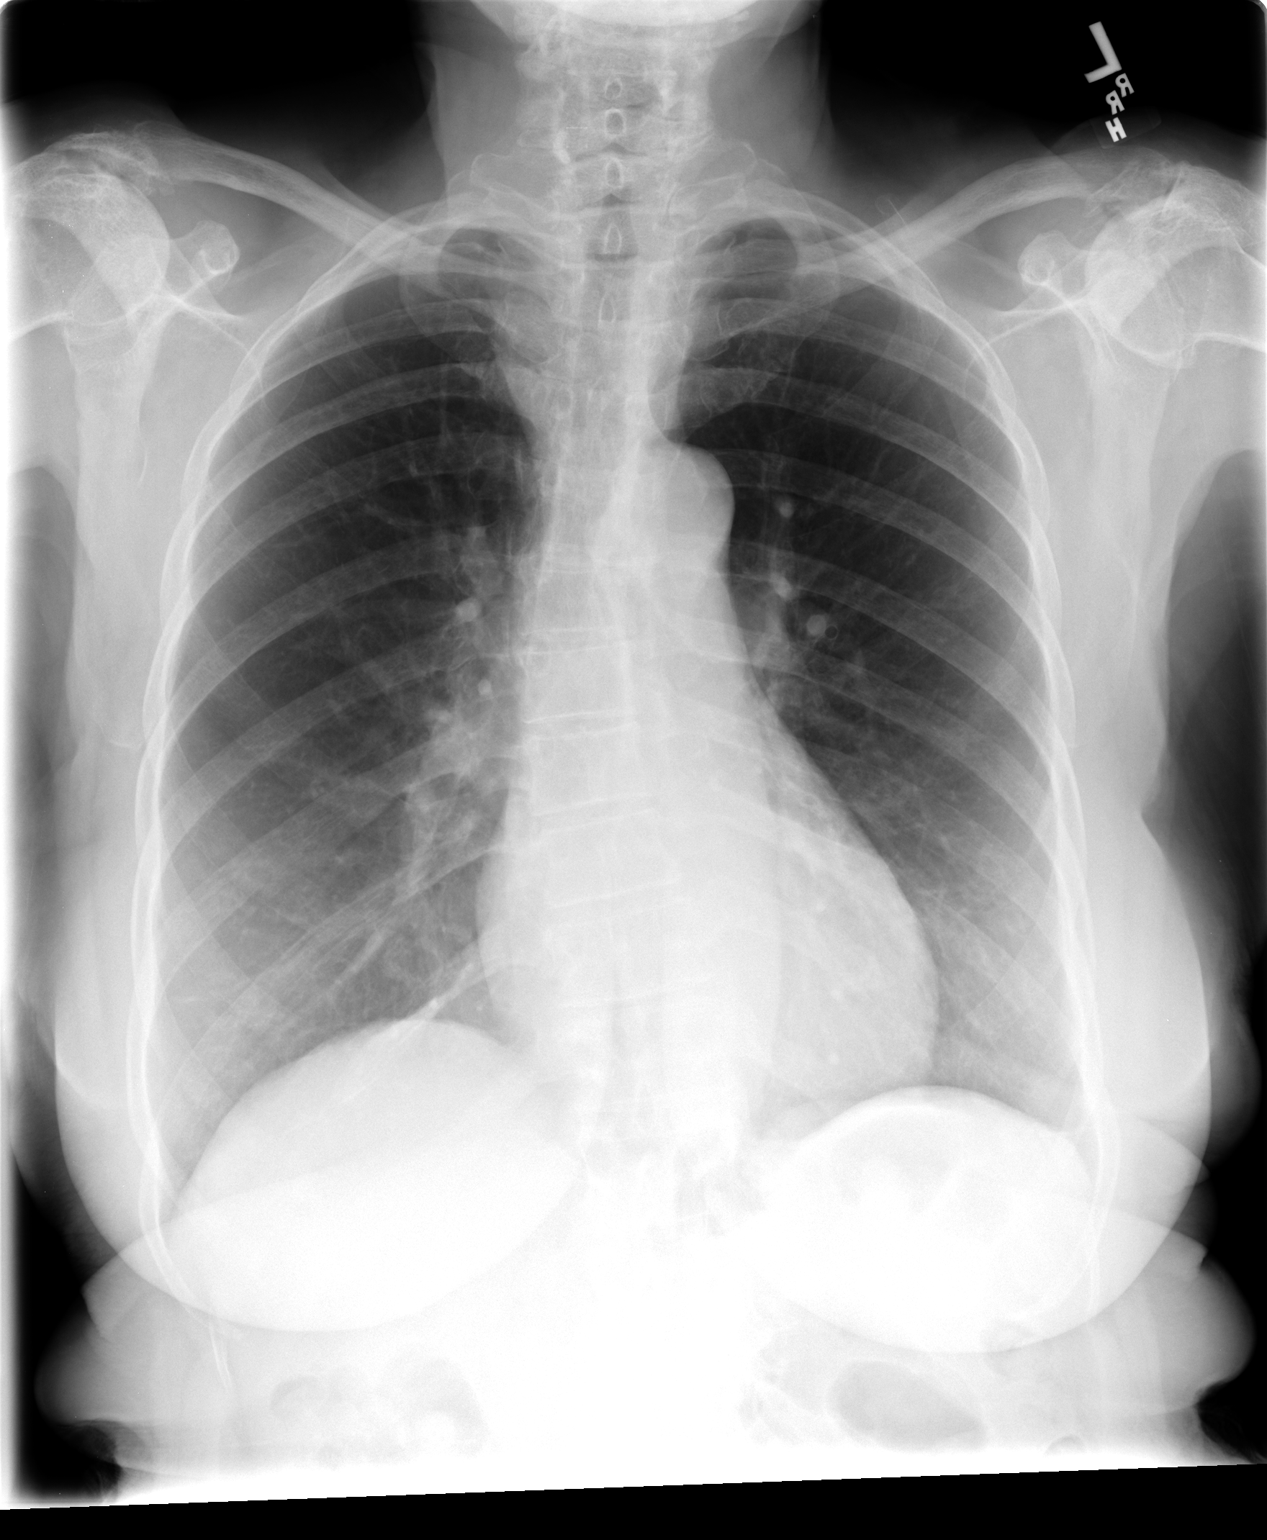

[view not recorded (2 of 2)]
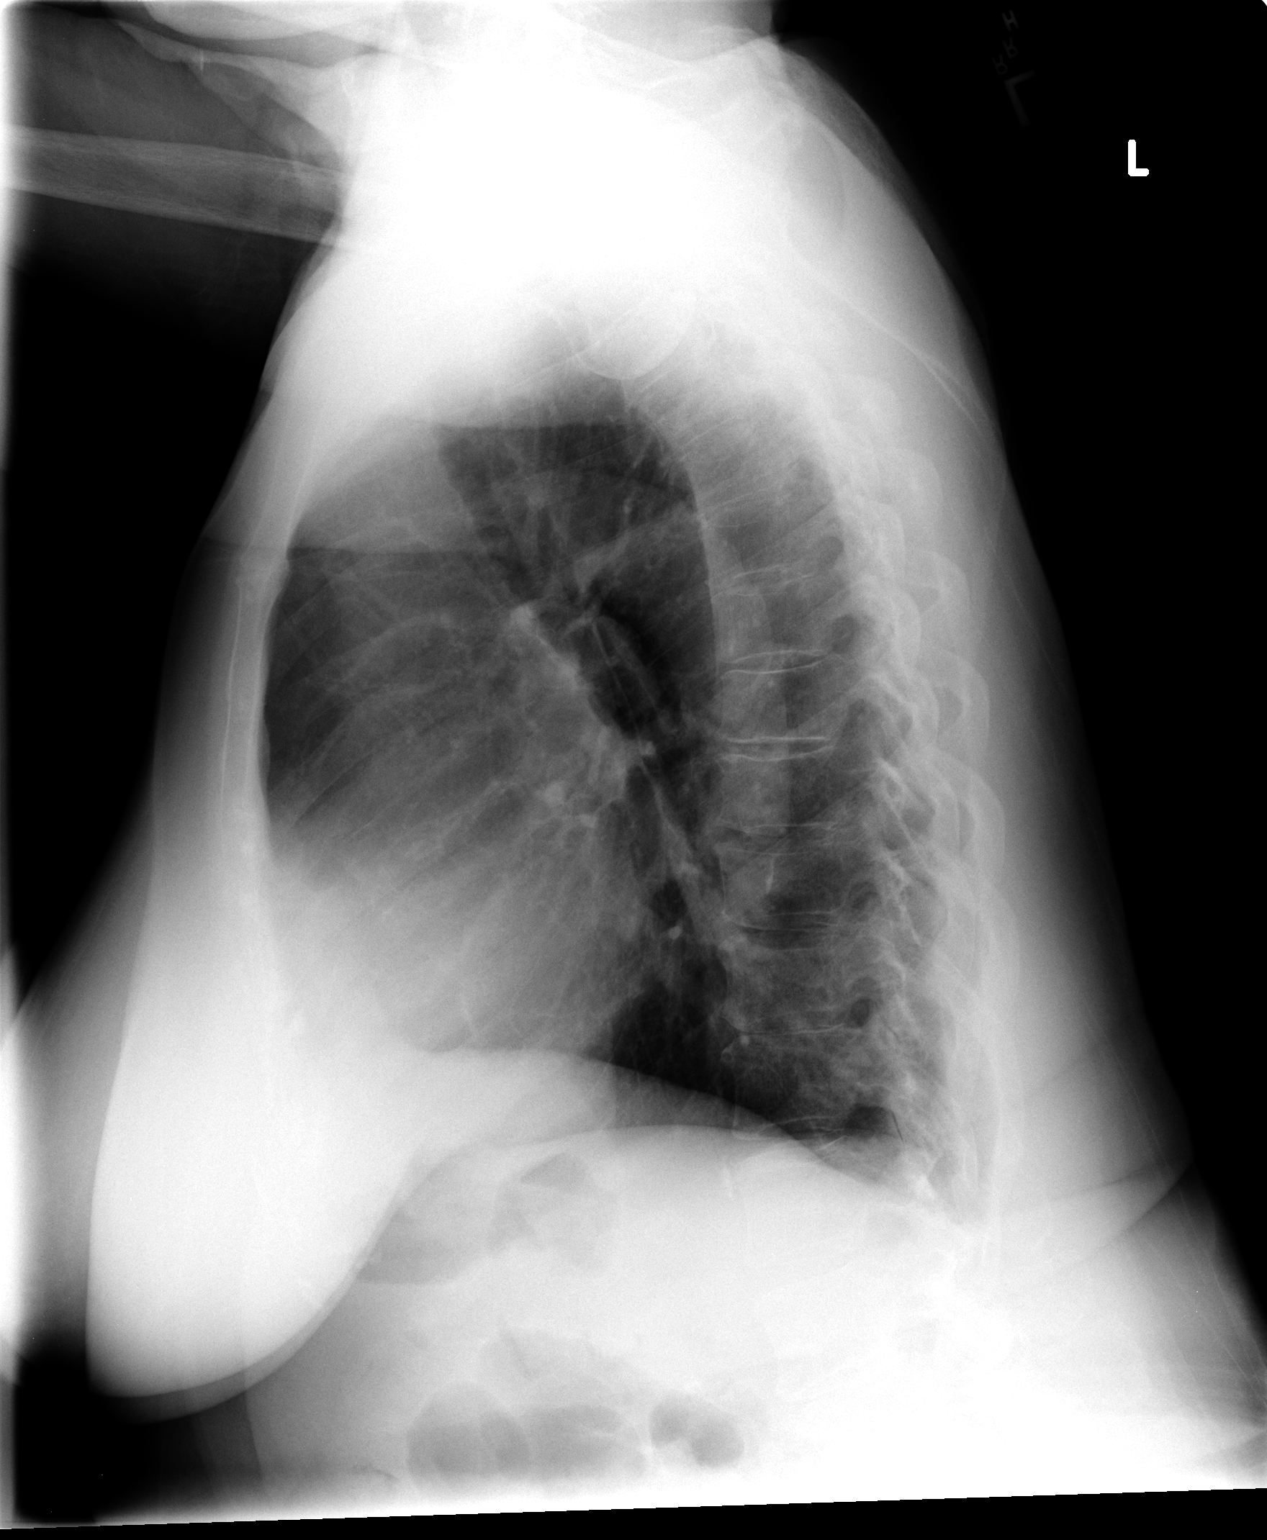

[2 of 2 positions shown; findings below may reference images not displayed]

FINDINGS: Lungs are clear.  No pleural effusion.  Heart size
normal.  No focal bony abnormality.  Degenerative disease about the
shoulders noted.
IMPRESSION: No acute disease.

## 2012-03-28 ENCOUNTER — Other Ambulatory Visit: Payer: Self-pay | Admitting: Family Medicine

## 2012-04-12 DIAGNOSIS — E785 Hyperlipidemia, unspecified: Secondary | ICD-10-CM | POA: Diagnosis not present

## 2012-04-12 DIAGNOSIS — D649 Anemia, unspecified: Secondary | ICD-10-CM | POA: Diagnosis not present

## 2012-04-12 DIAGNOSIS — I10 Essential (primary) hypertension: Secondary | ICD-10-CM | POA: Diagnosis not present

## 2012-04-12 LAB — CBC WITH DIFFERENTIAL/PLATELET
Basophils Absolute: 0 10*3/uL (ref 0.0–0.1)
Eosinophils Absolute: 0.1 10*3/uL (ref 0.0–0.7)
Eosinophils Relative: 3 % (ref 0–5)
Lymphocytes Relative: 32 % (ref 12–46)
Lymphs Abs: 1.8 10*3/uL (ref 0.7–4.0)
MCH: 30.3 pg (ref 26.0–34.0)
Neutrophils Relative %: 58 % (ref 43–77)
Platelets: 233 10*3/uL (ref 150–400)
RBC: 4.03 MIL/uL (ref 3.87–5.11)
RDW: 14.3 % (ref 11.5–15.5)
WBC: 5.6 10*3/uL (ref 4.0–10.5)

## 2012-04-12 LAB — LIPID PANEL
HDL: 43 mg/dL (ref >39)
Total CHOL/HDL Ratio: 3.1 Ratio
VLDL: 19 mg/dL (ref 0–40)

## 2012-04-12 LAB — COMPREHENSIVE METABOLIC PANEL
ALT: 9 U/L (ref 0–35)
AST: 16 U/L (ref 0–37)
CO2: 26 mEq/L (ref 19–32)
Creat: 0.9 mg/dL (ref 0.50–1.10)
Sodium: 140 mEq/L (ref 135–145)
Total Bilirubin: 0.5 mg/dL (ref 0.3–1.2)
Total Protein: 7 g/dL (ref 6.0–8.3)

## 2012-04-13 ENCOUNTER — Ambulatory Visit (INDEPENDENT_AMBULATORY_CARE_PROVIDER_SITE_OTHER): Payer: Medicare Other | Admitting: Family Medicine

## 2012-04-13 ENCOUNTER — Encounter: Payer: Self-pay | Admitting: Family Medicine

## 2012-04-13 VITALS — BP 140/80 | HR 80 | Resp 16 | Ht 59.5 in | Wt 140.4 lb

## 2012-04-13 DIAGNOSIS — E785 Hyperlipidemia, unspecified: Secondary | ICD-10-CM

## 2012-04-13 DIAGNOSIS — I10 Essential (primary) hypertension: Secondary | ICD-10-CM | POA: Diagnosis not present

## 2012-04-13 DIAGNOSIS — M069 Rheumatoid arthritis, unspecified: Secondary | ICD-10-CM | POA: Diagnosis not present

## 2012-04-13 DIAGNOSIS — R5383 Other fatigue: Secondary | ICD-10-CM

## 2012-04-13 DIAGNOSIS — B009 Herpesviral infection, unspecified: Secondary | ICD-10-CM

## 2012-04-13 DIAGNOSIS — R5381 Other malaise: Secondary | ICD-10-CM | POA: Diagnosis not present

## 2012-04-13 MED ORDER — MELOXICAM 7.5 MG PO TABS: 7.5000 mg | ORAL_TABLET | Freq: Every day | ORAL | Status: DC

## 2012-04-13 NOTE — Progress Notes (Signed)
  Subjective:    Patient ID: Stacy Meyers, female    DOB: 1932-06-30, 79 y.o.   MRN: [id number]  HPI The PT is here for follow up and re-evaluation of chronic medical conditions, medication management and review of any available recent lab and radiology data.  Preventive health is updated, specifically  Cancer screening and Immunization.   Questions or concerns regarding consultations or procedures which the PT has had in the interim are  addressed. The PT denies any adverse reactions to current medications since the last visit.  There are no new concerns.  There are no specific complaints       Review of Systems    See HPI Denies recent fever or chills. Denies sinus pressure, nasal congestion, ear pain or sore throat. Denies chest congestion, productive cough or wheezing. Denies chest pains, palpitations and leg swelling Denies abdominal pain, nausea, vomiting,diarrhea or constipation.   Denies dysuria, frequency, hesitancy or incontinence. Improved  joint pain, on new med Denies headaches, seizures, numbness, or tingling. Denies depression, anxiety or insomnia. Denies skin break down or rash.     Objective:   Physical Exam  Patient alert and oriented and in no cardiopulmonary distress.  HEENT: No facial asymmetry, EOMI, no sinus tenderness,  oropharynx pink and moist.  Neck supple no adenopathy.  Chest: Clear to auscultation bilaterally.  CVS: S1, S2 no murmurs, no S3.  ABD: Soft non tender. Bowel sounds normal.  Ext: No edema  MS: Adequate though reduced  ROM spine, shoulders, hips and knees.  Skin: Intact, no ulcerations or rash noted.  Psych: Good eye contact, normal affect. Memory intact not anxious or depressed appearing.  CNS: CN 2-12 intact, power, tone and sensation normal throughout.       Assessment & Plan:

## 2012-04-13 NOTE — Patient Instructions (Addendum)
Annual wellness early October, call if you need  Me beforre  Blood pressure, and labs are excellent. Exam is good  No changes in meds  i am thankful that you feel well  You need to take calcium with D 1200mg /1000IU  OK to take shingles shot, since you are not on methotrexate  Fasting lipid, cmp, TSH in October

## 2012-04-14 ENCOUNTER — Ambulatory Visit: Payer: Medicare Other | Admitting: Family Medicine

## 2012-04-16 NOTE — Assessment & Plan Note (Signed)
Controlled, no change in medication DASH diet and commitment to daily physical activity for a minimum of 30 minutes discussed and encouraged, as a part of hypertension management. The importance of attaining a healthy weight is also discussed.  

## 2012-04-16 NOTE — Assessment & Plan Note (Signed)
Improved, feels better off methotrexate and on meloxicam

## 2012-04-16 NOTE — Assessment & Plan Note (Signed)
Controlled, no change in medication  

## 2012-04-16 NOTE — Assessment & Plan Note (Signed)
Controlled, no change in medication Hyperlipidemia:Low fat diet discussed and encouraged.  \ 

## 2012-05-10 ENCOUNTER — Telehealth: Payer: Self-pay | Admitting: Family Medicine

## 2012-05-10 NOTE — Telephone Encounter (Signed)
pls set up ov for eval of new headache and hand swelling this week if possible please

## 2012-05-10 NOTE — Telephone Encounter (Signed)
pls refer to neurology re headaches, I have entered the order

## 2012-05-11 ENCOUNTER — Encounter: Payer: Self-pay | Admitting: Family Medicine

## 2012-05-11 ENCOUNTER — Ambulatory Visit (INDEPENDENT_AMBULATORY_CARE_PROVIDER_SITE_OTHER): Payer: Medicare Other | Admitting: Family Medicine

## 2012-05-11 VITALS — BP 170/80 | HR 60 | Resp 16 | Ht 59.5 in | Wt 141.1 lb

## 2012-05-11 DIAGNOSIS — R51 Headache: Secondary | ICD-10-CM

## 2012-05-11 DIAGNOSIS — I10 Essential (primary) hypertension: Secondary | ICD-10-CM

## 2012-05-11 DIAGNOSIS — J302 Other seasonal allergic rhinitis: Secondary | ICD-10-CM

## 2012-05-11 DIAGNOSIS — M67439 Ganglion, unspecified wrist: Secondary | ICD-10-CM | POA: Insufficient documentation

## 2012-05-11 DIAGNOSIS — M6740 Ganglion, unspecified site: Secondary | ICD-10-CM | POA: Diagnosis not present

## 2012-05-11 DIAGNOSIS — M67432 Ganglion, left wrist: Secondary | ICD-10-CM

## 2012-05-11 DIAGNOSIS — J309 Allergic rhinitis, unspecified: Secondary | ICD-10-CM

## 2012-05-11 DIAGNOSIS — R519 Headache, unspecified: Secondary | ICD-10-CM | POA: Insufficient documentation

## 2012-05-11 DIAGNOSIS — M069 Rheumatoid arthritis, unspecified: Secondary | ICD-10-CM

## 2012-05-11 MED ORDER — AMLODIPINE BESYLATE 2.5 MG PO TABS: 2.5000 mg | ORAL_TABLET | Freq: Every day | ORAL | Status: DC

## 2012-05-11 MED ORDER — BUTALBITAL-APAP-CAFFEINE 50-325-40 MG PO TABS: ORAL_TABLET | ORAL | Status: DC

## 2012-05-11 NOTE — Progress Notes (Signed)
  Subjective:    Patient ID: Stacy Meyers, female    DOB: [birth year], 79 y.o.   MRN: [id number]  HPI Pt in with new concerns of frontal headache at night which she is waking up wit also for the past week. Has noted increased allergy symptoms of frontal pressure also. No fever chills , sore throat or productive cough. Also c/o painful and enlarging cyst on wrist which she wuld like removed, no inciting trauma , nod/c from lesion   Review of Systems See HPI Denies recent fever or chills.  Denies chest congestion, productive cough or wheezing. Denies chest pains, palpitations and leg swelling Denies abdominal pain, nausea, vomiting,diarrhea or constipation.   Denies dysuria, frequency, hesitancy or incontinence. Denies joint pain, swelling and limitation in mobility. Denies , seizures, numbness, or tingling. Denies depression, anxiety or insomnia.1 year anniversarry of the death of her only child Denies skin break down or rash.        Objective:   Physical Exam Patient alert and oriented and in no cardiopulmonary distress.  HEENT: No facial asymmetry, EOMI, frontal  sinus tenderness,  oropharynx pink and moist.  Neck supple no adenopathy.TM clear bilaterally  Chest: Clear to auscultation bilaterally.  CVS: S1, S2 no murmurs, no S3.  ABD: Soft non tender. Bowel sounds normal.  Ext: No edema  MS: Adequate ROM spine, shoulders, hips and knees.  Skin: Intact, no ulcerations or rash noted.  Psych: Good eye contact, normal affect. Memory intact not anxious or depressed appearing.  CNS: CN 2-12 intact, power, tone and sensation normal throughout.        Assessment & Plan:

## 2012-05-11 NOTE — Patient Instructions (Addendum)
F/u in 4 weeks, pls call if headache persists.  New additional medication for blood pressure, start taking at 9pm every night  New medication for headache, 1 at night only if needed.   You are referred to Dr Hilda Lias re cyst on hand  No lab needed today, sugar was excellent when last checked.  Please stop over eating sweets!

## 2012-05-15 NOTE — Assessment & Plan Note (Signed)
Improved and stable on meloxicam

## 2012-05-15 NOTE — Assessment & Plan Note (Signed)
1 week h/o night time headache , no focal deficits, trial of fioricet , as needed

## 2012-05-15 NOTE — Assessment & Plan Note (Signed)
Increased and uncontrolled , contributing to new headaches

## 2012-05-15 NOTE — Assessment & Plan Note (Signed)
Uncontrolled.Likely contributing to new headaches Dose increase in norvasc.DASH diet and commitment to daily physical activity for a minimum of 30 minutes discussed and encouraged, as a part of hypertension management. The importance of attaining a healthy weight is also discussed.

## 2012-05-15 NOTE — Assessment & Plan Note (Signed)
Painful and inconvenient , refer to ortho

## 2012-05-15 NOTE — Progress Notes (Addendum)
  Subjective:    Patient ID: Stacy Meyers, female    DOB: December 23, 1932, 79 y.o.   MRN: [id number]  HPI    Review of Systems     Objective:   Physical Exam Ganglion cyst of left  wrist       Assessment & Plan:

## 2012-05-16 ENCOUNTER — Other Ambulatory Visit: Payer: Self-pay | Admitting: Family Medicine

## 2012-05-16 ENCOUNTER — Telehealth: Payer: Self-pay | Admitting: Family Medicine

## 2012-05-16 DIAGNOSIS — I10 Essential (primary) hypertension: Secondary | ICD-10-CM | POA: Diagnosis not present

## 2012-05-16 DIAGNOSIS — M6740 Ganglion, unspecified site: Secondary | ICD-10-CM | POA: Diagnosis not present

## 2012-05-16 MED ORDER — PREDNISONE (PAK) 5 MG PO TABS: 5.0000 mg | ORAL_TABLET | ORAL | Status: DC

## 2012-05-16 NOTE — Telephone Encounter (Signed)
Spoke with pt , pls refer to dr Gerilyn Pilgrim for headaches, new and uncontrolled also for brain scan. She alsoc c/o increased joint pain, prednisone dose pack has been sent in and she is aware

## 2012-05-18 ENCOUNTER — Ambulatory Visit (HOSPITAL_COMMUNITY)
Admission: RE | Admit: 2012-05-18 | Discharge: 2012-05-18 | Disposition: A | Discharge status: Home / Self Care | Payer: Medicare Other | Source: Ambulatory Visit | Attending: Family Medicine | Admitting: Family Medicine

## 2012-05-18 ENCOUNTER — Encounter (HOSPITAL_COMMUNITY): Payer: Self-pay

## 2012-05-18 DIAGNOSIS — R51 Headache: Secondary | ICD-10-CM | POA: Insufficient documentation

## 2012-05-23 DIAGNOSIS — M159 Polyosteoarthritis, unspecified: Secondary | ICD-10-CM | POA: Diagnosis not present

## 2012-05-23 DIAGNOSIS — I776 Arteritis, unspecified: Secondary | ICD-10-CM | POA: Diagnosis not present

## 2012-05-23 DIAGNOSIS — I10 Essential (primary) hypertension: Secondary | ICD-10-CM | POA: Diagnosis not present

## 2012-05-25 DIAGNOSIS — R51 Headache: Secondary | ICD-10-CM | POA: Diagnosis not present

## 2012-05-25 DIAGNOSIS — I776 Arteritis, unspecified: Secondary | ICD-10-CM | POA: Diagnosis not present

## 2012-06-01 ENCOUNTER — Encounter (HOSPITAL_COMMUNITY): Payer: Self-pay | Admitting: Pharmacy Technician

## 2012-06-01 ENCOUNTER — Other Ambulatory Visit: Payer: Self-pay | Admitting: Radiology

## 2012-06-01 ENCOUNTER — Other Ambulatory Visit: Payer: Self-pay

## 2012-06-01 ENCOUNTER — Encounter (HOSPITAL_COMMUNITY)
Admission: RE | Admit: 2012-06-01 | Discharge: 2012-06-01 | Disposition: A | Discharge status: Home / Self Care | Payer: Medicare Other | Source: Ambulatory Visit | Attending: Orthopaedic Surgery | Admitting: Orthopaedic Surgery

## 2012-06-01 ENCOUNTER — Encounter (HOSPITAL_COMMUNITY): Payer: Self-pay

## 2012-06-01 DIAGNOSIS — Z01810 Encounter for preprocedural cardiovascular examination: Secondary | ICD-10-CM | POA: Diagnosis not present

## 2012-06-01 DIAGNOSIS — M6740 Ganglion, unspecified site: Secondary | ICD-10-CM | POA: Diagnosis not present

## 2012-06-01 DIAGNOSIS — Z79899 Other long term (current) drug therapy: Secondary | ICD-10-CM | POA: Diagnosis not present

## 2012-06-01 DIAGNOSIS — Z01812 Encounter for preprocedural laboratory examination: Secondary | ICD-10-CM | POA: Diagnosis not present

## 2012-06-01 DIAGNOSIS — I10 Essential (primary) hypertension: Secondary | ICD-10-CM | POA: Diagnosis not present

## 2012-06-01 HISTORY — DX: Cardiac arrhythmia, unspecified: I49.9

## 2012-06-01 LAB — CBC WITH DIFFERENTIAL/PLATELET
Basophils Absolute: 0 10*3/uL (ref 0.0–0.1)
Basophils Relative: 0 % (ref 0–1)
Eosinophils Relative: 0 % (ref 0–5)
HCT: 37.8 % (ref 36.0–46.0)
Hemoglobin: 12.4 g/dL (ref 12.0–15.0)
MCH: 29.9 pg (ref 26.0–34.0)
MCHC: 32.8 g/dL (ref 30.0–36.0)
MCV: 91.1 fL (ref 78.0–100.0)
Monocytes Absolute: 0.1 10*3/uL (ref 0.1–1.0)
Monocytes Relative: 1 % — ABNORMAL LOW (ref 3–12)
Neutro Abs: 7.1 10*3/uL (ref 1.7–7.7)
RDW: 13.4 % (ref 11.5–15.5)

## 2012-06-01 LAB — URINALYSIS, ROUTINE W REFLEX MICROSCOPIC
Glucose, UA: 250 mg/dL — AB
Leukocytes, UA: NEGATIVE
Protein, ur: 30 mg/dL — AB
Specific Gravity, Urine: >1.03 — ABNORMAL HIGH (ref 1.005–1.030)
Urobilinogen, UA: 0.2 mg/dL (ref 0.0–1.0)

## 2012-06-01 LAB — COMPREHENSIVE METABOLIC PANEL
AST: 15 U/L (ref 0–37)
Albumin: 3.7 g/dL (ref 3.5–5.2)
BUN: 23 mg/dL (ref 6–23)
Calcium: 10.4 mg/dL (ref 8.4–10.5)
Chloride: 104 mEq/L (ref 96–112)
Creatinine, Ser: 0.81 mg/dL (ref 0.50–1.10)
GFR calc non Af Amer: 68 mL/min — ABNORMAL LOW (ref >90)
Total Bilirubin: 0.3 mg/dL (ref 0.3–1.2)

## 2012-06-01 LAB — URINE MICROSCOPIC-ADD ON

## 2012-06-01 MED ORDER — CHLORHEXIDINE GLUCONATE 4 % EX LIQD: 60.0000 mL | Freq: Once | CUTANEOUS | Status: DC

## 2012-06-01 NOTE — Patient Instructions (Addendum)
Stacy Meyers  06/01/2012   Your procedure is scheduled on:  06/02/2012  Report to [facility] at  920  AM.  Call this number if you have problems the morning of surgery: [phone]   Remember:   Do not eat food or drink liquids after midnight.   Take these medicines the morning of surgery with A SIP OF WATER: norvasc,fioricet,diltiazem, mobic, prednisone   Do not wear jewelry, make-up or nail polish.  Do not wear lotions, powders, or perfumes.   Do not shave 48 hours prior to surgery. Men may shave face and neck.  Do not bring valuables to the hospital.  Contacts, dentures or bridgework may not be worn into surgery.  Leave suitcase in the car. After surgery it may be brought to your room.  For patients admitted to the hospital, checkout time is 11:00 AM the day of discharge.   Patients discharged the day of surgery will not be allowed to drive  home.  Name and phone number of your driver: family  Special Instructions: Shower using CHG 2 nights before surgery and the night before surgery.  If you shower the day of surgery use CHG.  Use special wash - you have one bottle of CHG for all showers.  You should use approximately 1/3 of the bottle for each shower.   Please read over the following fact sheets that you were given: Pain Booklet, Coughing and Deep Breathing, MRSA Information, Surgical Site Infection Prevention, Anesthesia Post-op Instructions and Care and Recovery After Surgery Ganglion Cyst A ganglion cyst is a noncancerous, fluid-filled lump that occurs near joints or tendons. The ganglion cyst grows out of a joint or the lining of a tendon. It most often develops in the hand or wrist but can also develop in the shoulder, elbow, hip, knee, ankle, or foot. The round or oval ganglion can be pea sized or larger than a grape. Increased activity may enlarge the size of the cyst because more fluid starts to build up.  CAUSES  It is not completely known what causes a ganglion cyst to  grow. However, it may be related to:  Inflammation or irritation around the joint.  An injury.  Repetitive movements or overuse.  Arthritis. SYMPTOMS  A lump most often appears in the hand or wrist, but can occur in other areas of the body. Generally, the lump is painless without other symptoms. However, sometimes pain can be felt during activity or when pressure is applied to the lump. The lump may even be tender to the touch. Tingling, pain, numbness, or muscle weakness can occur if the ganglion cyst presses on a nerve. Your grip may be weak and you may have less movement in your joints.  DIAGNOSIS  Ganglion cysts are most often diagnosed based on a physical exam, noting where the cyst is and how it looks. Your caregiver will feel the lump and may shine a light alongside it. If it is a ganglion, a light often shines through it. Your caregiver may order an X-ray, ultrasound, or MRI to rule out other conditions. TREATMENT  Ganglions usually go away on their own without treatment. If pain or other symptoms are involved, treatment may be needed. Treatment is also needed if the ganglion limits your movement or if it gets infected. Treatment options include:  Wearing a wrist or finger brace or splint.  Taking anti-inflammatory medicine.  Draining fluid from the lump with a needle (aspiration).  Injecting a steroid into  the joint.  Surgery to remove the ganglion cyst and its stalk that is attached to the joint or tendon. However, ganglion cysts can grow back. HOME CARE INSTRUCTIONS   Do not press on the ganglion, poke it with a needle, or hit it with a heavy object. You may rub the lump gently and often. Sometimes fluid moves out of the cyst.  Only take medicines as directed by your caregiver.  Wear your brace or splint as directed by your caregiver. SEEK MEDICAL CARE IF:   Your ganglion becomes larger or more painful.  You have increased redness, red streaks, or swelling.  You have  pus coming from the lump.  You have weakness or numbness in the affected area. MAKE SURE YOU:   Understand these instructions.  Will watch your condition.  Will get help right away if you are not doing well or get worse. Document Released: 12/20/1999 Document Revised: 09/16/2011 Document Reviewed: 02/15/2007 [facility] Patient Information 2014 [place], [place]. PATIENT INSTRUCTIONS POST-ANESTHESIA  IMMEDIATELY FOLLOWING SURGERY:  Do not drive or operate machinery for the first twenty four hours after surgery.  Do not make any important decisions for twenty four hours after surgery or while taking narcotic pain medications or sedatives.  If you develop intractable nausea and vomiting or a severe headache please notify your doctor immediately.  FOLLOW-UP:  Please make an appointment with your surgeon as instructed. You do not need to follow up with anesthesia unless specifically instructed to do so.  WOUND CARE INSTRUCTIONS (if applicable):  Keep a dry clean dressing on the anesthesia/puncture wound site if there is drainage.  Once the wound has quit draining you may leave it open to air.  Generally you should leave the bandage intact for twenty four hours unless there is drainage.  If the epidural site drains for more than 36-48 hours please call the anesthesia department.  QUESTIONS?:  Please feel free to call your physician or the hospital operator if you have any questions, and they will be happy to assist you.

## 2012-06-01 NOTE — H&P (Signed)
Stacy Meyers is an 79 y.o. female.   Chief Complaint: ganglion cyst left dorsal wrist HPI: She has had a ganglion cyst of the left dorsal wrist for several weeks.  It began growing slowly but now it is bothering her a lot.  She saw me a couple of weeks ago and had it aspirated.  It returned in two days she says.  She says it is uncomfortable.  She would like to have it excised.  It is not red.  It does not cause numbness.  It does not limit her ROM.  It is just tender and bothersome.  I have told her it could recur even if excised.  I have gone over the risks and imponderables of the procedure of surgical excision as an outpatient.  It is an elective case.  She asked appropriate questions and appears to understand.  She wants to do it as soon as possible.  I have told her I have an opening tomorrow and she said that would be ideal.    Past Medical History  Diagnosis Date  . Essential hypertension, benign   . Mixed hyperlipidemia   . Rheumatoid arthritis(714.0)   . Osteoarthritis   . Bradycardia     Sinus - noted 6/11  . Hypokalemia 12/14/2010  . HTN (hypertension) 12/14/2010  . ARF (acute renal failure) 12/14/2010  . Hypercalcemia 12/14/2010  . Anemia 12/14/2010  . Cholelithiasis 12/14/2010  . Collagen vascular disease   . Diastolic dysfunction 12/15/2010    Grade 1 per 2-D echocardiogram  . Acute cholecystitis 12/16/2010    Past Surgical History  Procedure Laterality Date  . Bilateral cataract surgery  2009  . L2-l3 discectomy  2002  . Right shoulder cyst excision  2010  . L3-l4, l4-l5 lumbar decompression  2011  . Cholecystectomy  12/17/2010    Procedure: LAPAROSCOPIC CHOLECYSTECTOMY;  Surgeon: Fabio Bering;  Location: [facility] ORS;  Service: General;  Laterality: N/A;  . Colonoscopy  05/22/2011    Procedure: COLONOSCOPY;  Surgeon: Malissa Hippo, MD;  Location: [facility] END[facility] SUITE;  Service: Endoscopy;  Laterality: N/A;  1045  . Spine surgery      twice, approx 2006, 2011    Family History   Problem Relation Age of Onset  . Stroke Mother   . Cancer Father   . Diabetes Sister   . Diabetes Sister   . Kidney failure Brother   . Kidney failure Brother    Social History:  reports that she has never smoked. She has never used smokeless tobacco. She reports that she does not drink alcohol or use illicit drugs.  Allergies:  Allergies  Allergen Reactions  . Aspirin Other (See Comments)    REACTION: burns stomach (uncoated)    No prescriptions prior to admission    No results found for this or any previous visit (from the past 48 hour(s)). No results found.  Review of Systems  Cardiovascular:       History of hypertension, heart disease.  Musculoskeletal: Positive for back pain and joint pain (Pain left dorsal wrist from ganglion cyst for several weeks.  Aspirated but it recurred.  Tender.  Bothers her.).    There were no vitals taken for this visit. Physical Exam  Constitutional: She is oriented to person, place, and time. She appears well-developed and well-nourished.  HENT:  Head: Normocephalic and atraumatic.  Eyes: Conjunctivae and EOM are normal. Pupils are equal, round, and reactive to light.  Neck: Normal range of motion. Neck supple.  Cardiovascular: Normal rate, regular rhythm, normal heart sounds and intact distal pulses.   Respiratory: Effort normal and breath sounds normal.  GI: Soft. Bowel sounds are normal.  Musculoskeletal: She exhibits tenderness (Left dorsal wrist with small ganglion about 4 cm x 1.5 cm, in center of dorsal wrist.  Tender.  No redness.  No change in ROM of wrist.  NV intact.).       Hands: Neurological: She is alert and oriented to person, place, and time. She has normal reflexes.  Skin: Skin is warm and dry.  Psychiatric: She has a normal mood and affect. Her behavior is normal. Judgment and thought content normal.     Assessment/Plan Ganglion cyst left dorsal wrist.  For surgical excision as an outpatient. She will have a volar  splint post operative and then have sutures and the splint removed in two weeks.  Kieu Quiggle 06/01/2012, 9:22 AM

## 2012-06-02 ENCOUNTER — Encounter (HOSPITAL_COMMUNITY)
Admission: RE | Disposition: A | Discharge status: Home / Self Care | Payer: Self-pay | Source: Ambulatory Visit | Attending: Orthopaedic Surgery

## 2012-06-02 ENCOUNTER — Ambulatory Visit (HOSPITAL_COMMUNITY)
Admission: RE | Admit: 2012-06-02 | Discharge: 2012-06-02 | Disposition: A | Discharge status: Home / Self Care | Payer: Medicare Other | Source: Ambulatory Visit | Attending: Orthopaedic Surgery | Admitting: Orthopaedic Surgery

## 2012-06-02 ENCOUNTER — Encounter (HOSPITAL_COMMUNITY): Payer: Self-pay | Admitting: *Deleted

## 2012-06-02 ENCOUNTER — Encounter (HOSPITAL_COMMUNITY): Payer: Self-pay | Admitting: Anesthesiology

## 2012-06-02 ENCOUNTER — Ambulatory Visit (HOSPITAL_COMMUNITY): Payer: Medicare Other | Admitting: Anesthesiology

## 2012-06-02 DIAGNOSIS — M6740 Ganglion, unspecified site: Secondary | ICD-10-CM | POA: Diagnosis not present

## 2012-06-02 DIAGNOSIS — I10 Essential (primary) hypertension: Secondary | ICD-10-CM | POA: Diagnosis not present

## 2012-06-02 DIAGNOSIS — Z01810 Encounter for preprocedural cardiovascular examination: Secondary | ICD-10-CM | POA: Insufficient documentation

## 2012-06-02 DIAGNOSIS — Z01812 Encounter for preprocedural laboratory examination: Secondary | ICD-10-CM | POA: Insufficient documentation

## 2012-06-02 DIAGNOSIS — Z79899 Other long term (current) drug therapy: Secondary | ICD-10-CM | POA: Insufficient documentation

## 2012-06-02 HISTORY — PX: GANGLION CYST EXCISION: SHX1691

## 2012-06-02 SURGERY — EXCISION, GANGLION CYST, WRIST
Anesthesia: Monitor Anesthesia Care | Site: Wrist | Laterality: Left | Wound class: Clean

## 2012-06-02 MED ORDER — FENTANYL CITRATE 0.05 MG/ML IJ SOLN
INTRAMUSCULAR | Status: AC
  Filled 2012-06-02: qty 2

## 2012-06-02 MED ORDER — HYDROCODONE-ACETAMINOPHEN 7.5-325 MG PO TABS: 1.0000 | ORAL_TABLET | ORAL | Status: DC | PRN

## 2012-06-02 MED ORDER — PROPOFOL INFUSION 10 MG/ML OPTIME
INTRAVENOUS | Status: DC | PRN
  Administered 2012-06-02: 25 ug/kg/min via INTRAVENOUS

## 2012-06-02 MED ORDER — PROPOFOL 10 MG/ML IV BOLUS
INTRAVENOUS | Status: DC | PRN
  Administered 2012-06-02: 12 mg via INTRAVENOUS
  Administered 2012-06-02 (×5): 6 mg via INTRAVENOUS

## 2012-06-02 MED ORDER — GLYCOPYRROLATE 0.2 MG/ML IJ SOLN
INTRAMUSCULAR | Status: DC | PRN
  Administered 2012-06-02: 0.2 mg via INTRAVENOUS

## 2012-06-02 MED ORDER — LACTATED RINGERS IV SOLN
INTRAVENOUS | Status: DC
  Administered 2012-06-02: 1000 mL via INTRAVENOUS

## 2012-06-02 MED ORDER — SODIUM CHLORIDE 0.9 % IJ SOLN
INTRAMUSCULAR | Status: AC
  Filled 2012-06-02: qty 10

## 2012-06-02 MED ORDER — LIDOCAINE HCL (PF) 1 % IJ SOLN
INTRAMUSCULAR | Status: AC
  Filled 2012-06-02: qty 5

## 2012-06-02 MED ORDER — SODIUM CHLORIDE 0.9 % IR SOLN
Status: DC | PRN
  Administered 2012-06-02: 1000 mL

## 2012-06-02 MED ORDER — FENTANYL CITRATE 0.05 MG/ML IJ SOLN
25.0000 ug | INTRAMUSCULAR | Status: DC | PRN
  Administered 2012-06-02: 25 ug via INTRAVENOUS

## 2012-06-02 MED ORDER — LIDOCAINE HCL (PF) 0.5 % IJ SOLN
INTRAMUSCULAR | Status: AC
  Filled 2012-06-02: qty 50

## 2012-06-02 MED ORDER — MIDAZOLAM HCL 2 MG/2ML IJ SOLN
1.0000 mg | INTRAMUSCULAR | Status: DC | PRN
  Administered 2012-06-02: 2 mg via INTRAVENOUS

## 2012-06-02 MED ORDER — ONDANSETRON HCL 4 MG/2ML IJ SOLN: 4.0000 mg | Freq: Once | INTRAMUSCULAR | Status: DC | PRN

## 2012-06-02 MED ORDER — MIDAZOLAM HCL 2 MG/2ML IJ SOLN
INTRAMUSCULAR | Status: AC
  Filled 2012-06-02: qty 2

## 2012-06-02 MED ORDER — FENTANYL CITRATE 0.05 MG/ML IJ SOLN
INTRAMUSCULAR | Status: DC | PRN
  Administered 2012-06-02 (×4): 25 ug via INTRAVENOUS

## 2012-06-02 MED ORDER — PROPOFOL 10 MG/ML IV EMUL
INTRAVENOUS | Status: AC
  Filled 2012-06-02: qty 20

## 2012-06-02 MED ORDER — GLYCOPYRROLATE 0.2 MG/ML IJ SOLN
INTRAMUSCULAR | Status: AC
  Filled 2012-06-02: qty 1

## 2012-06-02 MED ORDER — HYDRALAZINE HCL 20 MG/ML IJ SOLN
INTRAMUSCULAR | Status: DC | PRN
  Administered 2012-06-02 (×4): 2 mg via INTRAVENOUS

## 2012-06-02 MED ORDER — FENTANYL CITRATE 0.05 MG/ML IJ SOLN
25.0000 ug | INTRAMUSCULAR | Status: DC | PRN
  Administered 2012-06-02 (×3): 50 ug via INTRAVENOUS

## 2012-06-02 MED ORDER — HYDRALAZINE HCL 20 MG/ML IJ SOLN
INTRAMUSCULAR | Status: AC
  Filled 2012-06-02: qty 1

## 2012-06-02 SURGICAL SUPPLY — 38 items
BAG HAMPER (MISCELLANEOUS) ×2 IMPLANT
BANDAGE ELASTIC 4 VELCRO NS (GAUZE/BANDAGES/DRESSINGS) ×2 IMPLANT
BANDAGE ESMARK 4X12 BL STRL LF (DISPOSABLE) ×1 IMPLANT
BLADE MINI RND TIP GREEN BEAV (BLADE) ×1 IMPLANT
BNDG CMPR 12X4 ELC STRL LF (DISPOSABLE) ×1
BNDG ESMARK 4X12 BLUE STRL LF (DISPOSABLE) ×2
CLOTH BEACON ORANGE TIMEOUT ST (SAFETY) ×2 IMPLANT
COVER LIGHT HANDLE STERIS (MISCELLANEOUS) ×4 IMPLANT
CUFF TOURNIQUET SINGLE 18IN (TOURNIQUET CUFF) ×2 IMPLANT
DRSG XEROFORM 1X8 (GAUZE/BANDAGES/DRESSINGS) ×2 IMPLANT
DURAPREP 26ML APPLICATOR (WOUND CARE) ×2 IMPLANT
GLOVE BIO SURGEON STRL SZ8 (GLOVE) ×2 IMPLANT
GLOVE BIO SURGEON STRL SZ8.5 (GLOVE) ×2 IMPLANT
GLOVE BIOGEL PI IND STRL 7.0 (GLOVE) IMPLANT
GLOVE BIOGEL PI INDICATOR 7.0 (GLOVE) ×2
GLOVE EXAM NITRILE MD LF STRL (GLOVE) ×2 IMPLANT
GLOVE OPTIFIT SS 6.5 STRL BRWN (GLOVE) ×1 IMPLANT
GLOVE SS BIOGEL STRL SZ 6.5 (GLOVE) IMPLANT
GLOVE SUPERSENSE BIOGEL SZ 6.5 (GLOVE) ×1
GOWN STRL REIN XL XLG (GOWN DISPOSABLE) ×4 IMPLANT
KIT ROOM TURNOVER APOR (KITS) ×2 IMPLANT
MANIFOLD NEPTUNE II (INSTRUMENTS) ×2 IMPLANT
NS IRRIG 1000ML POUR BTL (IV SOLUTION) ×2 IMPLANT
PACK BASIC LIMB (CUSTOM PROCEDURE TRAY) ×2 IMPLANT
PAD ARMBOARD 7.5X6 YLW CONV (MISCELLANEOUS) ×2 IMPLANT
PAD CAST 3X4 CTTN HI CHSV (CAST SUPPLIES) IMPLANT
PADDING CAST COTTON 3X4 STRL (CAST SUPPLIES) ×2
SET BASIN LINEN APH (SET/KITS/TRAYS/PACK) ×2 IMPLANT
SPLINT IMMOBILIZER J 3INX20FT (CAST SUPPLIES) ×1
SPLINT J IMMOBILIZER 3X20FT (CAST SUPPLIES) ×1 IMPLANT
SPONGE GAUZE 4X4 12PLY (GAUZE/BANDAGES/DRESSINGS) ×2 IMPLANT
SUT CHROMIC 3 0 SH 27 (SUTURE) ×2 IMPLANT
SUT ETHILON 3 0 FSL (SUTURE) ×2 IMPLANT
SUT PLAIN 3 0 SH 27IN (SUTURE) IMPLANT
SUT SILK 2 0 FSL 18 (SUTURE) IMPLANT
TAPE CLOTH SILK CARING 3INX10 (GAUZE/BANDAGES/DRESSINGS) ×1 IMPLANT
TOWEL OR 17X26 4PK STRL BLUE (TOWEL DISPOSABLE) ×2 IMPLANT
WATER STERILE IRR 1000ML POUR (IV SOLUTION) ×2 IMPLANT

## 2012-06-02 NOTE — Progress Notes (Signed)
The History and Physical is unchanged. I have examined the patient. The patient is medically able to have surgery on the left dorsal wrist . Darreld Mclean

## 2012-06-02 NOTE — Anesthesia Postprocedure Evaluation (Signed)
Anesthesia Post Note  Patient: Stacy Meyers  Procedure(s) Performed: Procedure(s) (LRB): Excision ganglion left dorsal wrist (Left)  Anesthesia type: MAC  Patient location: PACU  Post pain: Pain level controlled  Post assessment: Post-op Vital signs reviewed, Patient's Cardiovascular Status Stable, Respiratory Function Stable, Patent Airway, No signs of Nausea or vomiting and Pain level controlled  Last Vitals:  Filed Vitals:   06/02/12 1003  BP: 129/63  Pulse:   Temp: 36.7 C  Resp: 24    Post vital signs: Reviewed and stable  Level of consciousness: awake and alert   Complications: No apparent anesthesia complications

## 2012-06-02 NOTE — Transfer of Care (Signed)
Immediate Anesthesia Transfer of Care Note  Patient: Stacy Meyers  Procedure(s) Performed: Procedure(s) (LRB): Excision ganglion left dorsal wrist (Left)  Patient Location: PACU  Anesthesia Type: MAC  Level of Consciousness: awake  Airway & Oxygen Therapy: Patient Spontanous Breathing. Nasal cannula  Post-op Assessment: Report given to PACU RN, Post -op Vital signs reviewed and stable and Patient moving all extremities  Post vital signs: Reviewed and stable  Complications: No apparent anesthesia complications

## 2012-06-02 NOTE — Brief Op Note (Signed)
06/02/2012  9:54 AM  PATIENT:  Sherol Dade  79 y.o. female  PRE-OPERATIVE DIAGNOSIS:  ganglion cyst left wrist  POST-OPERATIVE DIAGNOSIS:  ganglion cyst left wrist  PROCEDURE:  Procedure(s): Excision ganglion left dorsal wrist (Left) dorsally  SURGEON:  Surgeon(s) and Role:    * Darreld Mclean, MD - Primary  PHYSICIAN ASSISTANT:   ASSISTANTS: none   ANESTHESIA:   regional  EBL:  Total I/O In: 600 [I.V.:600] Out: 0   BLOOD ADMINISTERED:none  DRAINS: none   LOCAL MEDICATIONS USED:  NONE  SPECIMEN:  Source of Specimen:  left dorsal wrist  DISPOSITION OF SPECIMEN:  PATHOLOGY  COUNTS:  YES  TOURNIQUET:  * Missing tourniquet times found for documented tourniquets in log:  [id number] *  DICTATION: .Other Dictation: Dictation Number [id number]  PLAN OF CARE: Discharge to home after PACU  PATIENT DISPOSITION:  PACU - hemodynamically stable.   Delay start of Pharmacological VTE agent (>24hrs) due to surgical blood loss or risk of bleeding: not applicable

## 2012-06-02 NOTE — Anesthesia Preprocedure Evaluation (Signed)
Anesthesia Evaluation  Patient identified by MRN, date of birth, ID band Patient awake    Reviewed: Allergy & Precautions, H&P , NPO status , Patient's Chart, lab work & pertinent test results  History of Anesthesia Complications Negative for: history of anesthetic complications  Airway Mallampati: II      Dental  (+) Teeth Intact   Pulmonary neg pulmonary ROS,    Pulmonary exam normal       Cardiovascular hypertension, Pt. on medications + dysrhythmias (hx irreg, not evident now.) Rhythm:Regular Rate:Normal     Neuro/Psych    GI/Hepatic   Endo/Other    Renal/GU      Musculoskeletal  (+) Arthritis -, Osteoarthritis,    Abdominal   Peds  Hematology   Anesthesia Other Findings   Reproductive/Obstetrics                           Anesthesia Physical Anesthesia Plan  ASA: III  Anesthesia Plan: Bier Block and MAC   Post-op Pain Management:    Induction: Intravenous  Airway Management Planned: Nasal Cannula  Additional Equipment:   Intra-op Plan:   Post-operative Plan:   Informed Consent: I have reviewed the patients History and Physical, chart, labs and discussed the procedure including the risks, benefits and alternatives for the proposed anesthesia with the patient or authorized representative who has indicated his/her understanding and acceptance.     Plan Discussed with:   Anesthesia Plan Comments:         Anesthesia Quick Evaluation

## 2012-06-02 NOTE — Anesthesia Procedure Notes (Signed)
Procedure Name: MAC Date/Time: 06/02/2012 9:10 AM Performed by: Franco Nones Pre-anesthesia Checklist: Patient identified, Emergency Drugs available, Suction available, Timeout performed and Patient being monitored Patient Re-evaluated:Patient Re-evaluated prior to inductionOxygen Delivery Method: Nasal Cannula    Anesthesia Regional Block:  Bier block (IV Regional)  Pre-Anesthetic Checklist: ,, timeout performed, Correct Patient, Correct Site, Correct Laterality, Correct Procedure,, site marked, surgical consent,, at surgeon's request  Laterality: Left     Needles:  Injection technique: Single-shot  Needle Type: Other      Needle Gauge: 22 and 22 G    Additional Needles: Bier block (IV Regional)  Nerve Stimulator or Paresthesia:   Additional Responses:  Pulse checked post tourniquet inflation. IV NSL discontinued post injection. Narrative:  Start time: 06/02/2012 9:18 AM End time: 06/02/2012 9:22 AM  Performed by: Personally  Resident/CRNA: Minerva Areola  Bier block (IV Regional)

## 2012-06-03 ENCOUNTER — Encounter (HOSPITAL_COMMUNITY): Payer: Self-pay | Admitting: Orthopaedic Surgery

## 2012-06-03 DIAGNOSIS — M6740 Ganglion, unspecified site: Secondary | ICD-10-CM | POA: Diagnosis not present

## 2012-06-03 NOTE — Op Note (Signed)
NAMEMASSIE, COGLIANO                 ACCOUNT NO.:  [id number]  MEDICAL RECORD NO.:  [id number]  LOCATION:  [facility]                          FACILITY:  [facility]  PHYSICIAN:  J. Darreld Mclean, M.D. DATE OF BIRTH:  05/22/1932  DATE OF PROCEDURE: DATE OF DISCHARGE:  06/02/2012                              OPERATIVE REPORT   PREOPERATIVE DIAGNOSIS:  Ganglion cyst, left dorsal wrist.  POSTOPERATIVE DIAGNOSIS:  Ganglion cyst, left dorsal wrist.  PROCEDURE:  Excision ganglion cyst, left dorsal wrist.  ANESTHESIA:  Regional Bier block.  Please refer to anesthesia record for tourniquet time.  No drains.  Volar plaster splint applied during the procedure.  SURGEON:  J. Darreld Mclean, M.D.  INDICATIONS:  The patient is a 79 year old female who has developed a cyst on the dorsum on the left wrist over the last several months.  I attempted aspiration, but the cyst returned after several days. She requested to have it excised.  It is causing no numbness and no decreased range of motion of her wrist.  She says it is very irritating. I went over the risks and imponderables particularly including the possibility of that recurring postoperatively.  She appears to understand the risk of procedure and elective outpatient procedure.  DESCRIPTION OF PROCEDURE:  The patient was seen in the holding area and the left wrist was identified as correct surgical site.  I placed a mark over the lesion.  The patient was brought to the operating room and given Bier block anesthesia while supine.  The patient was prepped and draped in usual manner.  Generalized time-out identifying the patient as Ms. Cudd and doing a left dorsal wrist for excision of the ganglion.  All instrumentation were properly positioned and working.  The OR team knew each other.  Incision made directly over the cyst.  With careful dissection, the cyst was exposed.  Had a large broad-based stalk and went down to the joint itself.  I excised  the lesion.  The retinaculum around the wrist was repaired using 2-0 chromic and then 2-0 plain and subcuticular 3-0 nylon suture was used in the skin.  Tourniquet was deflated in usual fashion.  She had sterile dressing applied and a volar plaster splint applied.  ACE bandage applied loosely.  She will be given appropriate analgesia for pain.  I will see her in the office in approximately 2 weeks.  She will continue wearing the splint.  If any difficulties, she will contact me through the office hospital beeper system.          ______________________________ Shela Commons. Darreld Mclean, M.D.     JWK/MEDQ  D:  06/02/2012  T:  06/03/2012  Job:  [id number]

## 2012-06-08 ENCOUNTER — Ambulatory Visit: Payer: Medicare Other | Admitting: Family Medicine

## 2012-06-28 DIAGNOSIS — R51 Headache: Secondary | ICD-10-CM | POA: Diagnosis not present

## 2012-07-05 ENCOUNTER — Other Ambulatory Visit: Payer: Self-pay | Admitting: Family Medicine

## 2012-07-20 ENCOUNTER — Other Ambulatory Visit: Payer: Self-pay | Admitting: Family Medicine

## 2012-07-27 ENCOUNTER — Encounter: Payer: Self-pay | Admitting: Family Medicine

## 2012-07-27 ENCOUNTER — Ambulatory Visit (INDEPENDENT_AMBULATORY_CARE_PROVIDER_SITE_OTHER): Payer: Medicare Other | Admitting: Family Medicine

## 2012-07-27 VITALS — BP 164/82 | HR 84 | Resp 16 | Wt 144.8 lb

## 2012-07-27 DIAGNOSIS — E785 Hyperlipidemia, unspecified: Secondary | ICD-10-CM | POA: Diagnosis not present

## 2012-07-27 DIAGNOSIS — M25469 Effusion, unspecified knee: Secondary | ICD-10-CM

## 2012-07-27 DIAGNOSIS — I10 Essential (primary) hypertension: Secondary | ICD-10-CM | POA: Diagnosis not present

## 2012-07-27 DIAGNOSIS — M069 Rheumatoid arthritis, unspecified: Secondary | ICD-10-CM

## 2012-07-27 DIAGNOSIS — M25461 Effusion, right knee: Secondary | ICD-10-CM

## 2012-07-27 MED ORDER — PREDNISONE 5 MG PO TABS: 5.0000 mg | ORAL_TABLET | Freq: Two times a day (BID) | ORAL | Status: AC

## 2012-07-27 MED ORDER — METHYLPREDNISOLONE ACETATE 40 MG/ML IJ SUSP
40.0000 mg | Freq: Once | INTRAMUSCULAR | Status: AC
  Administered 2012-07-27: 40 mg via INTRAMUSCULAR

## 2012-07-27 MED ORDER — KETOROLAC TROMETHAMINE 60 MG/2ML IJ SOLN
30.0000 mg | Freq: Once | INTRAMUSCULAR | Status: AC
  Administered 2012-07-27: 30 mg via INTRAMUSCULAR

## 2012-07-27 MED ORDER — AMLODIPINE BESYLATE 2.5 MG PO TABS: 2.5000 mg | ORAL_TABLET | Freq: Every day | ORAL | Status: DC

## 2012-07-27 NOTE — Assessment & Plan Note (Signed)
Acute right knee swelling with reduced mobility. Toradol and depo medrol in office followed by prednisone x5 days Pt to call back if no relief for ortho referral

## 2012-07-27 NOTE — Assessment & Plan Note (Signed)
Controlled, no change in medication Hyperlipidemia:Low fat diet discussed and encouraged.  \ 

## 2012-07-27 NOTE — Addendum Note (Signed)
Addended by: Kandis Fantasia B on: 07/27/2012 02:49 PM   Modules accepted: Orders

## 2012-07-27 NOTE — Assessment & Plan Note (Signed)
Uncontrolled, inadvertently, never started the amlodipine prescribed at last visit. Importance of same discussed , she will start tooday

## 2012-07-27 NOTE — Patient Instructions (Addendum)
F/u in mid September, please call if you need me before   Your blood pressure is high, please collect and start taking amlodipine 2.5mg  once daily, continue the medication you have been on before  Toradol 30mg  and depo medrol 40mg  im in the office today for arthritis in right knee followed by prednisone for 5 days.  You are referred to rheumatologist in [facility] for help with your rheumatoid arthritis, we will call you with the appointment

## 2012-07-27 NOTE — Assessment & Plan Note (Signed)
Severe disease had been on methotrexate for years, recently discontinued in the past year. Would benefit from rheumatology eval, she is referred for same

## 2012-07-27 NOTE — Progress Notes (Signed)
  Subjective:    Patient ID: Stacy Meyers, female    DOB: 1932/05/30, 79 y.o.   MRN: [id number]  HPI 3 day h/o increased pain and swelling of right knee esp posterior aspect, with no inciting trauma  Increased deformity and stiffness in fingers off methotrexate, will refer to rheumaotology.     Review of Systems See HPI Denies recent fever or chills. Denies sinus pressure, nasal congestion, ear pain or sore throat. Denies chest congestion, productive cough or wheezing. Denies chest pains, palpitations and leg swelling Denies abdominal pain, nausea, vomiting,diarrhea or constipation.   Denies dysuria, frequency, hesitancy or incontinence.  Denies headaches, seizures, numbness, or tingling. Denies depression, anxiety or insomnia. Denies skin break down or rash.        Objective:   Physical Exam  Patient alert and oriented and in no cardiopulmonary distress.  HEENT: No facial asymmetry, EOMI, no sinus tenderness,  oropharynx pink and moist.  Neck supple no adenopathy.  Chest: Clear to auscultation bilaterally.  CVS: S1, S2 no murmurs, no S3.  ABD: Soft non tender. Bowel sounds normal.  Ext: No edema  MS: Adequate though reduced ROM spine, shoulders, hips and decreased in right knee.  Skin: Intact, no ulcerations or rash noted.  Psych: Good eye contact, normal affect. Memory intact not anxious or depressed appearing.  CNS: CN 2-12 intact, power, tone and sensation normal throughout.       Assessment & Plan:

## 2012-08-11 DIAGNOSIS — M2040 Other hammer toe(s) (acquired), unspecified foot: Secondary | ICD-10-CM | POA: Diagnosis not present

## 2012-08-11 DIAGNOSIS — M2010 Hallux valgus (acquired), unspecified foot: Secondary | ICD-10-CM | POA: Diagnosis not present

## 2012-08-29 DIAGNOSIS — H5200 Hypermetropia, unspecified eye: Secondary | ICD-10-CM | POA: Diagnosis not present

## 2012-08-29 DIAGNOSIS — H353190 Nonexudative age-related macular degeneration, unspecified eye, stage unspecified: Secondary | ICD-10-CM | POA: Diagnosis not present

## 2012-08-29 DIAGNOSIS — H52229 Regular astigmatism, unspecified eye: Secondary | ICD-10-CM | POA: Diagnosis not present

## 2012-09-09 DIAGNOSIS — R300 Dysuria: Secondary | ICD-10-CM | POA: Diagnosis not present

## 2012-09-09 DIAGNOSIS — M25549 Pain in joints of unspecified hand: Secondary | ICD-10-CM | POA: Diagnosis not present

## 2012-09-09 DIAGNOSIS — R5381 Other malaise: Secondary | ICD-10-CM | POA: Diagnosis not present

## 2012-09-09 DIAGNOSIS — Z111 Encounter for screening for respiratory tuberculosis: Secondary | ICD-10-CM | POA: Diagnosis not present

## 2012-09-09 DIAGNOSIS — Z79899 Other long term (current) drug therapy: Secondary | ICD-10-CM | POA: Diagnosis not present

## 2012-09-09 DIAGNOSIS — M25569 Pain in unspecified knee: Secondary | ICD-10-CM | POA: Diagnosis not present

## 2012-09-09 DIAGNOSIS — M069 Rheumatoid arthritis, unspecified: Secondary | ICD-10-CM | POA: Diagnosis not present

## 2012-09-09 DIAGNOSIS — M2550 Pain in unspecified joint: Secondary | ICD-10-CM | POA: Diagnosis not present

## 2012-09-09 DIAGNOSIS — M25579 Pain in unspecified ankle and joints of unspecified foot: Secondary | ICD-10-CM | POA: Diagnosis not present

## 2012-09-20 ENCOUNTER — Other Ambulatory Visit: Payer: Self-pay | Admitting: Family Medicine

## 2012-09-27 DIAGNOSIS — M25549 Pain in joints of unspecified hand: Secondary | ICD-10-CM | POA: Diagnosis not present

## 2012-09-27 DIAGNOSIS — M25569 Pain in unspecified knee: Secondary | ICD-10-CM | POA: Diagnosis not present

## 2012-09-27 DIAGNOSIS — M25529 Pain in unspecified elbow: Secondary | ICD-10-CM | POA: Diagnosis not present

## 2012-09-27 DIAGNOSIS — M069 Rheumatoid arthritis, unspecified: Secondary | ICD-10-CM | POA: Diagnosis not present

## 2012-09-28 DIAGNOSIS — Z79899 Other long term (current) drug therapy: Secondary | ICD-10-CM | POA: Diagnosis not present

## 2012-10-12 DIAGNOSIS — M2550 Pain in unspecified joint: Secondary | ICD-10-CM | POA: Diagnosis not present

## 2012-10-12 DIAGNOSIS — R5381 Other malaise: Secondary | ICD-10-CM | POA: Diagnosis not present

## 2012-10-12 DIAGNOSIS — Z23 Encounter for immunization: Secondary | ICD-10-CM | POA: Diagnosis not present

## 2012-10-12 DIAGNOSIS — Z111 Encounter for screening for respiratory tuberculosis: Secondary | ICD-10-CM | POA: Diagnosis not present

## 2012-10-12 DIAGNOSIS — Z79899 Other long term (current) drug therapy: Secondary | ICD-10-CM | POA: Diagnosis not present

## 2012-10-12 DIAGNOSIS — R300 Dysuria: Secondary | ICD-10-CM | POA: Diagnosis not present

## 2012-10-13 ENCOUNTER — Encounter (INDEPENDENT_AMBULATORY_CARE_PROVIDER_SITE_OTHER): Payer: Self-pay

## 2012-10-13 ENCOUNTER — Encounter: Payer: Self-pay | Admitting: Family Medicine

## 2012-10-13 ENCOUNTER — Ambulatory Visit (INDEPENDENT_AMBULATORY_CARE_PROVIDER_SITE_OTHER): Payer: Medicare Other | Admitting: Family Medicine

## 2012-10-13 VITALS — BP 140/70 | HR 74 | Resp 16 | Ht 59.5 in | Wt 144.0 lb

## 2012-10-13 DIAGNOSIS — M069 Rheumatoid arthritis, unspecified: Secondary | ICD-10-CM

## 2012-10-13 DIAGNOSIS — I10 Essential (primary) hypertension: Secondary | ICD-10-CM | POA: Diagnosis not present

## 2012-10-13 DIAGNOSIS — Z13820 Encounter for screening for osteoporosis: Secondary | ICD-10-CM | POA: Diagnosis not present

## 2012-10-13 DIAGNOSIS — J309 Allergic rhinitis, unspecified: Secondary | ICD-10-CM | POA: Diagnosis not present

## 2012-10-13 DIAGNOSIS — J302 Other seasonal allergic rhinitis: Secondary | ICD-10-CM

## 2012-10-13 DIAGNOSIS — E785 Hyperlipidemia, unspecified: Secondary | ICD-10-CM

## 2012-10-13 MED ORDER — DILTIAZEM HCL ER COATED BEADS 120 MG PO CP24: ORAL_CAPSULE | ORAL | Status: DC

## 2012-10-13 MED ORDER — AMLODIPINE BESYLATE 2.5 MG PO TABS: 2.5000 mg | ORAL_TABLET | Freq: Every day | ORAL | Status: DC

## 2012-10-13 MED ORDER — ALENDRONATE SODIUM 70 MG PO TABS: 70.0000 mg | ORAL_TABLET | ORAL | Status: DC

## 2012-10-13 MED ORDER — CALCIUM CARBONATE-VITAMIN D 500-200 MG-UNIT PO TABS: 1.0000 | ORAL_TABLET | Freq: Two times a day (BID) | ORAL | Status: DC

## 2012-10-13 MED ORDER — ROSUVASTATIN CALCIUM 10 MG PO TABS: ORAL_TABLET | ORAL | Status: DC

## 2012-10-13 NOTE — Patient Instructions (Signed)
Pelvic and breast in 4 month, call if you need me before  You are referred for bone density test.  STOP meloxicam, ranitidine and albuterol, I have taken those away.  Start daily calcium and a once  Weekly bone builder , this is sent to your pharmacy  I am trying to get labs from yesterday to see blood tests done and urine,before I order labs for next visit, will call about these if needed

## 2012-10-13 NOTE — Progress Notes (Signed)
  Subjective:    Patient ID: Stacy Meyers, female    DOB: [birth year], 79 y.o.   MRN: [id number]  HPI The PT is here for follow up and re-evaluation of chronic medical conditions, medication management and review of any available recent lab and radiology data.  Preventive health is updated, specifically  Cancer screening and Immunization.   Questions or concerns regarding consultations or procedures which the PT has had in the interim are  Addressed.Has been seeing rheumatologist and is extremely happy with the results of reduced pain and increased mobility The PT denies any adverse reactions to current medications since the last visit.  There are no new concerns.  There are no specific complaints       Review of Systems See HPI Denies recent fever or chills. Denies sinus pressure, nasal congestion, ear pain or sore throat. Denies chest congestion, productive cough or wheezing. Denies chest pains, palpitations and leg swelling Denies abdominal pain, nausea, vomiting,diarrhea or constipation.   Denies dysuria, frequency, hesitancy or incontinence.  Denies headaches, seizures, numbness, or tingling. Denies depression, anxiety or insomnia. Denies skin break down or rash.        Objective:   Physical Exam  Patient alert and oriented and in no cardiopulmonary distress.  HEENT: No facial asymmetry, EOMI, no sinus tenderness,  oropharynx pink and moist.  Neck supple no adenopathy.  Chest: Clear to auscultation bilaterally.  CVS: S1, S2 no murmurs, no S3.  ABD: Soft non tender. Bowel sounds normal.  Ext: No edema  MS: Adequate ROM spine, shoulders, hips and knees.  Skin: Intact, no ulcerations or rash noted.  Psych: Good eye contact, normal affect. Memory intact not anxious or depressed appearing.  CNS: CN 2-12 intact, power, tone and sensation normal throughout.       Assessment & Plan:

## 2012-10-15 NOTE — Assessment & Plan Note (Signed)
Controlled, no change in medication DASH diet and commitment to daily physical activity for a minimum of 30 minutes discussed and encouraged, as a part of hypertension management. The importance of attaining a healthy weight is also discussed.  

## 2012-10-15 NOTE — Assessment & Plan Note (Signed)
Controlled, no change in medication Updated lab needed  

## 2012-10-15 NOTE — Assessment & Plan Note (Addendum)
Now being treated by rheumatologist with marked improvement in symptoms, is able to do things she had not beenm for years, pain free on low dose prednisone , recently started on arava

## 2012-10-15 NOTE — Assessment & Plan Note (Signed)
No current flare, continue flonase as needed

## 2012-10-19 ENCOUNTER — Ambulatory Visit (HOSPITAL_COMMUNITY)
Admission: RE | Admit: 2012-10-19 | Discharge: 2012-10-19 | Disposition: A | Discharge status: Home / Self Care | Payer: Medicare Other | Source: Ambulatory Visit | Attending: Family Medicine | Admitting: Family Medicine

## 2012-10-19 DIAGNOSIS — M810 Age-related osteoporosis without current pathological fracture: Secondary | ICD-10-CM | POA: Diagnosis not present

## 2012-10-19 DIAGNOSIS — Z13820 Encounter for screening for osteoporosis: Secondary | ICD-10-CM

## 2012-11-06 ENCOUNTER — Other Ambulatory Visit: Payer: Self-pay | Admitting: Family Medicine

## 2012-12-08 DIAGNOSIS — M069 Rheumatoid arthritis, unspecified: Secondary | ICD-10-CM | POA: Diagnosis not present

## 2012-12-08 DIAGNOSIS — M25519 Pain in unspecified shoulder: Secondary | ICD-10-CM | POA: Diagnosis not present

## 2012-12-08 DIAGNOSIS — Z09 Encounter for follow-up examination after completed treatment for conditions other than malignant neoplasm: Secondary | ICD-10-CM | POA: Diagnosis not present

## 2012-12-08 DIAGNOSIS — M25529 Pain in unspecified elbow: Secondary | ICD-10-CM | POA: Diagnosis not present

## 2012-12-12 DIAGNOSIS — Z79899 Other long term (current) drug therapy: Secondary | ICD-10-CM | POA: Diagnosis not present

## 2012-12-13 ENCOUNTER — Telehealth: Payer: Self-pay | Admitting: Family Medicine

## 2012-12-13 DIAGNOSIS — I10 Essential (primary) hypertension: Secondary | ICD-10-CM

## 2012-12-13 NOTE — Telephone Encounter (Signed)
Will forward to pcp

## 2012-12-13 NOTE — Telephone Encounter (Signed)
pls get lab from the office and hand the result to me so I can address this

## 2012-12-14 ENCOUNTER — Other Ambulatory Visit: Payer: Self-pay | Admitting: Family Medicine

## 2012-12-14 MED ORDER — POTASSIUM CHLORIDE CRYS ER 20 MEQ PO TBCR: 20.0000 meq | EXTENDED_RELEASE_TABLET | Freq: Every day | ORAL | Status: DC

## 2012-12-14 NOTE — Telephone Encounter (Signed)
I have called Dr Tama Gander office twice and had them to fax her results to both fax numbers but its not coming through. She did tell me that her potassium was 3.3. Please advise

## 2012-12-14 NOTE — Telephone Encounter (Signed)
I have sent klor con 20 meq one daily to her pharmacy let her know and she needs chem 7 drawn first week of January for f/u , pls order and let her know

## 2012-12-16 NOTE — Addendum Note (Signed)
Addended by: Abner Greenspan on: 12/16/2012 08:33 AM   Modules accepted: Orders

## 2012-12-16 NOTE — Telephone Encounter (Signed)
Patient aware and lab order mailed to her

## 2012-12-19 ENCOUNTER — Other Ambulatory Visit: Payer: Self-pay | Admitting: Family Medicine

## 2013-01-10 ENCOUNTER — Other Ambulatory Visit: Payer: Self-pay | Admitting: Family Medicine

## 2013-01-10 DIAGNOSIS — E785 Hyperlipidemia, unspecified: Secondary | ICD-10-CM | POA: Diagnosis not present

## 2013-01-10 DIAGNOSIS — I10 Essential (primary) hypertension: Secondary | ICD-10-CM | POA: Diagnosis not present

## 2013-01-10 LAB — BASIC METABOLIC PANEL
BUN: 23 mg/dL (ref 6–23)
CO2: 27 mEq/L (ref 19–32)
Calcium: 9.8 mg/dL (ref 8.4–10.5)
Chloride: 105 mEq/L (ref 96–112)
Creat: 0.89 mg/dL (ref 0.50–1.10)
Glucose, Bld: 87 mg/dL (ref 70–99)
Potassium: 3.7 mEq/L (ref 3.5–5.3)
Sodium: 139 mEq/L (ref 135–145)

## 2013-01-11 LAB — HEPATIC FUNCTION PANEL
ALBUMIN: 3.8 g/dL (ref 3.5–5.2)
ALK PHOS: 56 U/L (ref 39–117)
ALT: 9 U/L (ref 0–35)
AST: 16 U/L (ref 0–37)
BILIRUBIN INDIRECT: 0.2 mg/dL (ref 0.0–0.9)
Bilirubin, Direct: 0.1 mg/dL (ref 0.0–0.3)
TOTAL PROTEIN: 6.6 g/dL (ref 6.0–8.3)
Total Bilirubin: 0.3 mg/dL (ref 0.3–1.2)

## 2013-01-11 LAB — LIPID PANEL
CHOLESTEROL: 148 mg/dL (ref 0–200)
HDL: 60 mg/dL (ref >39)
LDL Cholesterol: 71 mg/dL (ref 0–99)
TRIGLYCERIDES: 83 mg/dL (ref <150)
Total CHOL/HDL Ratio: 2.5 Ratio
VLDL: 17 mg/dL (ref 0–40)

## 2013-02-16 ENCOUNTER — Other Ambulatory Visit (HOSPITAL_COMMUNITY)
Admission: RE | Admit: 2013-02-16 | Discharge: 2013-02-16 | Disposition: A | Discharge status: Home / Self Care | Payer: Medicare Other | Source: Ambulatory Visit | Attending: Family Medicine | Admitting: Family Medicine

## 2013-02-16 ENCOUNTER — Ambulatory Visit (INDEPENDENT_AMBULATORY_CARE_PROVIDER_SITE_OTHER): Payer: Medicare Other | Admitting: Family Medicine

## 2013-02-16 ENCOUNTER — Encounter (INDEPENDENT_AMBULATORY_CARE_PROVIDER_SITE_OTHER): Payer: Self-pay

## 2013-02-16 ENCOUNTER — Encounter: Payer: Self-pay | Admitting: Family Medicine

## 2013-02-16 VITALS — BP 144/80 | HR 64 | Resp 16 | Ht 59.0 in | Wt 148.0 lb

## 2013-02-16 DIAGNOSIS — Z124 Encounter for screening for malignant neoplasm of cervix: Secondary | ICD-10-CM

## 2013-02-16 DIAGNOSIS — Z1211 Encounter for screening for malignant neoplasm of colon: Secondary | ICD-10-CM | POA: Diagnosis not present

## 2013-02-16 DIAGNOSIS — I10 Essential (primary) hypertension: Secondary | ICD-10-CM

## 2013-02-16 DIAGNOSIS — Z0000 Encounter for general adult medical examination without abnormal findings: Secondary | ICD-10-CM

## 2013-02-16 DIAGNOSIS — R7301 Impaired fasting glucose: Secondary | ICD-10-CM

## 2013-02-16 DIAGNOSIS — Z1231 Encounter for screening mammogram for malignant neoplasm of breast: Secondary | ICD-10-CM

## 2013-02-16 DIAGNOSIS — E785 Hyperlipidemia, unspecified: Secondary | ICD-10-CM

## 2013-02-16 DIAGNOSIS — Z79899 Other long term (current) drug therapy: Secondary | ICD-10-CM | POA: Diagnosis not present

## 2013-02-16 DIAGNOSIS — IMO0002 Reserved for concepts with insufficient information to code with codable children: Secondary | ICD-10-CM

## 2013-02-16 LAB — HEMOCCULT GUIAC POC 1CARD (OFFICE): Fecal Occult Blood, POC: NEGATIVE

## 2013-02-16 NOTE — Assessment & Plan Note (Signed)
The patient is for an annual exam. Health maintenance is reviewed and updated, specifically  recommended,  screening tests and immunizations. Recent lab and radiologic data is reviewed also.

## 2013-02-16 NOTE — Patient Instructions (Signed)
F/U in  4 months, call if you need me before  It is important that you exercise regularly at least 30 minutes 5 times a week. If you develop chest pain, have severe difficulty breathing, or feel very tired, stop exercising immediately and seek medical attention    A healthy diet is rich in fruit, vegetables and whole grains. Poultry fish, nuts and beans are a healthy choice for protein rather then red meat. A low sodium diet and drinking 64 ounces of water daily is generally recommended. Oils and sweet should be limited. Carbohydrates especially for those who are diabetic or overweight, should be limited to 45 to 60 gram per meal. It is important to eat on a regular schedule, at least 3 times daily. Snacks should be primarily fruits, vegetables or nuts.  Adequate rest, generally 6 to 8 hours per night is important for good health.Good sleep hygiene involves setting a regular bedtime, and turning off all sound and light in your sleep environment.Limiting caffeine intake will also help with the ability to rest well  Lab work needs to be done 3 to 5 days before your follow up visit please.Fasting lipid, cmp, TSH, hBA1C  All medications need to be brought to every visit  Mammogram past due , this will be scheduled

## 2013-02-19 ENCOUNTER — Encounter: Payer: Self-pay | Admitting: Family Medicine

## 2013-02-19 NOTE — Progress Notes (Signed)
   Subjective:    Patient ID: Stacy Meyers, female    DOB: 1932-10-01, 80 y.o.   MRN: [id number]  HPI The patient is for an annual exam. Health maintenance is reviewed and updated, specifically  recommended,  screening tests and immunizations. Recent lab and radiologic data is reviewed also.    Review of Systems See HPI Denies recent fever or chills. Denies sinus pressure, nasal congestion, ear pain or sore throat. Denies chest congestion, productive cough or wheezing. Denies chest pains, palpitations and leg swelling Denies abdominal pain, nausea, vomiting,diarrhea or constipation.   Denies dysuria, frequency, hesitancy or incontinence. Denies joint pain, swelling and limitation in mobility. Denies headaches, seizures, numbness, or tingling. Denies depression, anxiety or insomnia. Denies skin break down or rash.        Objective:   Physical Exam Pleasant well nourished female, alert and oriented x 3, in no cardio-pulmonary distress. Afebrile. HEENT No facial trauma or asymetry. Sinuses non tender.  EOMI, PERTL, fundoscopic exam  no hemorhage or exudate.  External ears normal, tympanic membranes clear. Oropharynx moist, no exudate, fairly good dentition. Neck: supple, no adenopathy,JVD or thyromegaly.No bruits.  Chest: Clear to ascultation bilaterally.No crackles or wheezes. Non tender to palpation  Breast: No asymetry,no masses. No nipple discharge or inversion. No axillary or supraclavicular adenopathy  Cardiovascular system; Heart sounds normal,  S1 and  S2 ,no S3.  No murmur, or thrill. Apical beat not displaced Peripheral pulses normal.  Abdomen: Soft, non tender, no organomegaly or masses. No bruits. Bowel sounds normal. No guarding, tenderness or rebound.  Rectal:  No mass. Guaiac negative stool.  GU: External genitalia normal. No lesions. Vaginal canal normal.No discharge. Uterus atrophic, no adnexal masses, no cervical motion or adnexal  tenderness.  Musculoskeletal exam: Decreased  ROM of spine,adequate in  hips , shoulders and knees. Marked deformity of fingers due to rheumatoid disease. No muscle wasting or atrophy.   Neurologic: Cranial nerves 2 to 12 intact. Power, tone ,sensation and reflexes normal throughout. No disturbance in gait. No tremor.  Skin: Intact, no ulceration, erythema , scaling or rash noted. Pigmentation normal throughout  Psych; Normal mood and affect. Judgement and concentration normal        Assessment & Plan:  Routine general medical examination at a health care facility The patient is for an annual exam. Health maintenance is reviewed and updated, specifically  recommended,  screening tests and immunizations. Recent lab and radiologic data is reviewed also.

## 2013-02-21 ENCOUNTER — Ambulatory Visit (HOSPITAL_COMMUNITY): Payer: Medicare Other

## 2013-02-28 ENCOUNTER — Ambulatory Visit (HOSPITAL_COMMUNITY): Payer: Medicare Other

## 2013-03-06 ENCOUNTER — Ambulatory Visit (HOSPITAL_COMMUNITY)
Admission: RE | Admit: 2013-03-06 | Discharge: 2013-03-06 | Disposition: A | Discharge status: Home / Self Care | Payer: Medicare Other | Source: Ambulatory Visit | Attending: Family Medicine | Admitting: Family Medicine

## 2013-03-06 DIAGNOSIS — Z1231 Encounter for screening mammogram for malignant neoplasm of breast: Secondary | ICD-10-CM | POA: Diagnosis not present

## 2013-04-04 DIAGNOSIS — H353190 Nonexudative age-related macular degeneration, unspecified eye, stage unspecified: Secondary | ICD-10-CM | POA: Diagnosis not present

## 2013-04-04 DIAGNOSIS — H524 Presbyopia: Secondary | ICD-10-CM | POA: Diagnosis not present

## 2013-04-06 ENCOUNTER — Other Ambulatory Visit: Payer: Self-pay | Admitting: Family Medicine

## 2013-04-08 ENCOUNTER — Other Ambulatory Visit: Payer: Self-pay | Admitting: Family Medicine

## 2013-04-10 ENCOUNTER — Other Ambulatory Visit: Payer: Self-pay

## 2013-04-10 ENCOUNTER — Telehealth: Payer: Self-pay | Admitting: Family Medicine

## 2013-04-10 MED ORDER — ACYCLOVIR 400 MG PO TABS: ORAL_TABLET | ORAL | Status: DC

## 2013-04-10 NOTE — Telephone Encounter (Signed)
Attempted to reach patient to see what pharmacy she prefers.  No answer.  Will send to local pharmacy since patient states that she is out of medicine.

## 2013-04-12 DIAGNOSIS — Z79899 Other long term (current) drug therapy: Secondary | ICD-10-CM | POA: Diagnosis not present

## 2013-04-25 DIAGNOSIS — Z09 Encounter for follow-up examination after completed treatment for conditions other than malignant neoplasm: Secondary | ICD-10-CM | POA: Diagnosis not present

## 2013-04-25 DIAGNOSIS — M2550 Pain in unspecified joint: Secondary | ICD-10-CM | POA: Diagnosis not present

## 2013-04-25 DIAGNOSIS — M069 Rheumatoid arthritis, unspecified: Secondary | ICD-10-CM | POA: Diagnosis not present

## 2013-05-01 ENCOUNTER — Other Ambulatory Visit: Payer: Self-pay

## 2013-05-01 MED ORDER — POTASSIUM CHLORIDE CRYS ER 20 MEQ PO TBCR: 20.0000 meq | EXTENDED_RELEASE_TABLET | Freq: Every day | ORAL | Status: DC

## 2013-05-01 MED ORDER — ALENDRONATE SODIUM 70 MG PO TABS: 70.0000 mg | ORAL_TABLET | ORAL | Status: DC

## 2013-06-14 DIAGNOSIS — Z79899 Other long term (current) drug therapy: Secondary | ICD-10-CM | POA: Diagnosis not present

## 2013-07-06 ENCOUNTER — Other Ambulatory Visit: Payer: Self-pay | Admitting: Family Medicine

## 2013-07-10 NOTE — Telephone Encounter (Signed)
Pt called stating her pharmacy has been trying to call about her RX. Pt said her acyclovir needs to be refilled, [facility]. Please advise [phone] pt said she is out and needs it today

## 2013-07-27 ENCOUNTER — Ambulatory Visit: Payer: Medicare Other | Admitting: Family Medicine

## 2013-07-27 DIAGNOSIS — M069 Rheumatoid arthritis, unspecified: Secondary | ICD-10-CM | POA: Diagnosis not present

## 2013-07-27 DIAGNOSIS — M25519 Pain in unspecified shoulder: Secondary | ICD-10-CM | POA: Diagnosis not present

## 2013-07-27 DIAGNOSIS — M25529 Pain in unspecified elbow: Secondary | ICD-10-CM | POA: Diagnosis not present

## 2013-07-27 DIAGNOSIS — M25549 Pain in joints of unspecified hand: Secondary | ICD-10-CM | POA: Diagnosis not present

## 2013-07-31 DIAGNOSIS — I10 Essential (primary) hypertension: Secondary | ICD-10-CM | POA: Diagnosis not present

## 2013-07-31 DIAGNOSIS — R7301 Impaired fasting glucose: Secondary | ICD-10-CM | POA: Diagnosis not present

## 2013-07-31 DIAGNOSIS — E785 Hyperlipidemia, unspecified: Secondary | ICD-10-CM | POA: Diagnosis not present

## 2013-07-31 LAB — COMPREHENSIVE METABOLIC PANEL
ALBUMIN: 3.9 g/dL (ref 3.5–5.2)
ALT: 9 U/L (ref 0–35)
AST: 12 U/L (ref 0–37)
Alkaline Phosphatase: 55 U/L (ref 39–117)
BUN: 29 mg/dL — ABNORMAL HIGH (ref 6–23)
CHLORIDE: 103 meq/L (ref 96–112)
CO2: 28 meq/L (ref 19–32)
Calcium: 10.7 mg/dL — ABNORMAL HIGH (ref 8.4–10.5)
Creat: 1.03 mg/dL (ref 0.50–1.10)
GLUCOSE: 96 mg/dL (ref 70–99)
POTASSIUM: 3.8 meq/L (ref 3.5–5.3)
SODIUM: 139 meq/L (ref 135–145)
TOTAL PROTEIN: 6.5 g/dL (ref 6.0–8.3)
Total Bilirubin: 0.3 mg/dL (ref 0.2–1.2)

## 2013-07-31 LAB — TSH: TSH: 1.782 u[IU]/mL (ref 0.350–4.500)

## 2013-07-31 LAB — LIPID PANEL
Cholesterol: 127 mg/dL (ref 0–200)
HDL: 59 mg/dL (ref >39)
LDL CALC: 53 mg/dL (ref 0–99)
Total CHOL/HDL Ratio: 2.2 Ratio
Triglycerides: 77 mg/dL (ref <150)
VLDL: 15 mg/dL (ref 0–40)

## 2013-07-31 LAB — HEMOGLOBIN A1C
HEMOGLOBIN A1C: 6.5 % — AB (ref <5.7)
Mean Plasma Glucose: 140 mg/dL — ABNORMAL HIGH (ref <117)

## 2013-08-03 ENCOUNTER — Ambulatory Visit (INDEPENDENT_AMBULATORY_CARE_PROVIDER_SITE_OTHER): Payer: Medicare Other | Admitting: Family Medicine

## 2013-08-03 ENCOUNTER — Encounter: Payer: Self-pay | Admitting: Family Medicine

## 2013-08-03 ENCOUNTER — Encounter (INDEPENDENT_AMBULATORY_CARE_PROVIDER_SITE_OTHER): Payer: Self-pay

## 2013-08-03 VITALS — BP 140/70 | HR 75 | Resp 16 | Ht 59.5 in | Wt 148.1 lb

## 2013-08-03 DIAGNOSIS — Z23 Encounter for immunization: Secondary | ICD-10-CM | POA: Insufficient documentation

## 2013-08-03 DIAGNOSIS — R7301 Impaired fasting glucose: Secondary | ICD-10-CM

## 2013-08-03 DIAGNOSIS — I10 Essential (primary) hypertension: Secondary | ICD-10-CM

## 2013-08-03 DIAGNOSIS — E785 Hyperlipidemia, unspecified: Secondary | ICD-10-CM | POA: Diagnosis not present

## 2013-08-03 DIAGNOSIS — M069 Rheumatoid arthritis, unspecified: Secondary | ICD-10-CM

## 2013-08-03 NOTE — Patient Instructions (Addendum)
F/u in 4 month, call if you need me before  Prevnar today   Exam today is good except that Bp slightly high, eat mor veg and reduce salt , walk every day and try to lose 3 to 5 pounds  Blood sugar is high, reduce sweets, pies , cookies, you do not want to have to take medication for diabetes!  HBA1C, CBC and chem 7 and EGFR in 4 month, non fast  We will review list with fruits on low in sugar with you

## 2013-08-03 NOTE — Assessment & Plan Note (Addendum)
Slightly elevated, lifestyle change only DASH diet and commitment to daily physical activity for a minimum of 30 minutes discussed and encouraged, as a part of hypertension management. The importance of attaining a healthy weight is also discussed.

## 2013-08-03 NOTE — Assessment & Plan Note (Signed)
Controlled, no change in medication Hyperlipidemia:Low fat diet discussed and encouraged.  \ 

## 2013-08-03 NOTE — Assessment & Plan Note (Signed)
prevnar administered

## 2013-08-03 NOTE — Assessment & Plan Note (Signed)
Patient educated about the importance of limiting  Carbohydrate intake , the need to commit to daily physical activity for a minimum of 30 minutes , and to commit weight loss. The fact that changes in all these areas will reduce or eliminate all together the development of diabetes is stressed.   Updated lab needed at/ before next visit.  

## 2013-08-03 NOTE — Progress Notes (Signed)
   Subjective:    Patient ID: Stacy Meyers, female    DOB: [birth year], 80 y.o.   MRN: [id number]  HPI The PT is here for follow up and re-evaluation of chronic medical conditions, medication management and review of any available recent lab and radiology data.  Preventive health is updated, specifically  Cancer screening and Immunization.   Questions or concerns regarding consultations or procedures which the PT has had in the interim are  Addressed.Seeing rheumatology, difficulty with access to desired medication for her arthritis and is back on methotrexate, joints are doing fairluy well, also maintained on daily prednisone The PT denies any adverse reactions to current medications since the last visit.  C/o sore painful bump on left labial fold x 1 wewek wants it checked, denies drainage form this ,  Fever  Or chills     Review of Systems See HPI Denies recent fever or chills. Denies sinus pressure, nasal congestion, ear pain or sore throat. Denies chest congestion, productive cough or wheezing. Denies chest pains, palpitations and leg swelling Denies abdominal pain, nausea, vomiting,diarrhea or constipation.   Denies dysuria, frequency, hesitancy or incontinence.  Denies headaches, seizures, numbness, or tingling. Denies depression, anxiety or insomnia.         Objective:   Physical Exam BP 140/70  Pulse 75  Resp 16  Ht 4' 11.5" (1.511 m)  Wt 148 lb 1.9 oz (67.187 kg)  BMI 29.43 kg/m2  SpO2 96% Patient alert and oriented and in no cardiopulmonary distress.  HEENT: No facial asymmetry, EOMI,   oropharynx pink and moist.  Neck supple no JVD, no mass.  Chest: Clear to auscultation bilaterally.  CVS: S1, S2 no murmurs, no S3.Regular rate.  ABD: Soft non tender.  Genitalia: external genitalia examined and palpated, no lesion present at this time in area of concern Ext: No edema  MS: Adequate ROM spine, shoulders, hips and knees.Deformity of digits from rheumatoid   disease  Skin: Intact, no ulcerations or rash noted.  Psych: Good eye contact, normal affect. Memory intact not anxious or depressed appearing.  CNS: CN 2-12 intact, power,  normal throughout.no focal deficits noted.        Assessment & Plan:  HTN (hypertension) Slightly elevated, lifestyle change only DASH diet and commitment to daily physical activity for a minimum of 30 minutes discussed and encouraged, as a part of hypertension management. The importance of attaining a healthy weight is also discussed.   HYPERLIPIDEMIA Controlled, no change in medication Hyperlipidemia:Low fat diet discussed and encouraged.    Impaired fasting glucose Patient educated about the importance of limiting  Carbohydrate intake , the need to commit to daily physical activity for a minimum of 30 minutes , and to commit weight loss. The fact that changes in all these areas will reduce or eliminate all together the development of diabetes is stressed.   Updated lab needed at/ before next visit.   Need for vaccination with 13-polyvalent pneumococcal conjugate vaccine prevnar administered  Rheumatoid arthritis(714.0) Improved pain and function on methotrexate and prednisone, treated by rheumatology

## 2013-08-06 NOTE — Assessment & Plan Note (Signed)
Improved pain and function on methotrexate and prednisone, treated by rheumatology

## 2013-08-17 DIAGNOSIS — Z79899 Other long term (current) drug therapy: Secondary | ICD-10-CM | POA: Diagnosis not present

## 2013-09-17 ENCOUNTER — Other Ambulatory Visit: Payer: Self-pay | Admitting: Family Medicine

## 2013-10-02 ENCOUNTER — Other Ambulatory Visit: Payer: Self-pay

## 2013-10-02 MED ORDER — ACYCLOVIR 400 MG PO TABS: ORAL_TABLET | ORAL | Status: DC

## 2013-10-04 ENCOUNTER — Other Ambulatory Visit: Payer: Self-pay

## 2013-10-04 MED ORDER — AMLODIPINE BESYLATE 2.5 MG PO TABS: ORAL_TABLET | ORAL | Status: DC

## 2013-10-04 MED ORDER — DILTIAZEM HCL ER COATED BEADS 120 MG PO CP24: ORAL_CAPSULE | ORAL | Status: DC

## 2013-10-05 DIAGNOSIS — Z23 Encounter for immunization: Secondary | ICD-10-CM | POA: Diagnosis not present

## 2013-10-28 ENCOUNTER — Other Ambulatory Visit: Payer: Self-pay | Admitting: Family Medicine

## 2013-10-30 DIAGNOSIS — Z79899 Other long term (current) drug therapy: Secondary | ICD-10-CM | POA: Diagnosis not present

## 2013-10-30 DIAGNOSIS — M5137 Other intervertebral disc degeneration, lumbosacral region: Secondary | ICD-10-CM | POA: Diagnosis not present

## 2013-10-30 DIAGNOSIS — M19041 Primary osteoarthritis, right hand: Secondary | ICD-10-CM | POA: Diagnosis not present

## 2013-10-30 DIAGNOSIS — M0589 Other rheumatoid arthritis with rheumatoid factor of multiple sites: Secondary | ICD-10-CM | POA: Diagnosis not present

## 2013-12-04 ENCOUNTER — Encounter: Payer: Self-pay | Admitting: Family Medicine

## 2013-12-04 ENCOUNTER — Other Ambulatory Visit: Payer: Self-pay | Admitting: Family Medicine

## 2013-12-04 ENCOUNTER — Ambulatory Visit (INDEPENDENT_AMBULATORY_CARE_PROVIDER_SITE_OTHER): Payer: Medicare Other | Admitting: Family Medicine

## 2013-12-04 ENCOUNTER — Encounter (INDEPENDENT_AMBULATORY_CARE_PROVIDER_SITE_OTHER): Payer: Self-pay

## 2013-12-04 VITALS — BP 136/70 | HR 61 | Resp 16 | Ht 60.0 in | Wt 148.0 lb

## 2013-12-04 DIAGNOSIS — E785 Hyperlipidemia, unspecified: Secondary | ICD-10-CM | POA: Diagnosis not present

## 2013-12-04 DIAGNOSIS — N3090 Cystitis, unspecified without hematuria: Secondary | ICD-10-CM | POA: Diagnosis not present

## 2013-12-04 DIAGNOSIS — R7309 Other abnormal glucose: Secondary | ICD-10-CM | POA: Diagnosis not present

## 2013-12-04 DIAGNOSIS — H547 Unspecified visual loss: Secondary | ICD-10-CM | POA: Diagnosis not present

## 2013-12-04 DIAGNOSIS — R7301 Impaired fasting glucose: Secondary | ICD-10-CM

## 2013-12-04 DIAGNOSIS — M069 Rheumatoid arthritis, unspecified: Secondary | ICD-10-CM

## 2013-12-04 DIAGNOSIS — I10 Essential (primary) hypertension: Secondary | ICD-10-CM

## 2013-12-04 DIAGNOSIS — R7303 Prediabetes: Secondary | ICD-10-CM

## 2013-12-04 DIAGNOSIS — R351 Nocturia: Secondary | ICD-10-CM | POA: Diagnosis not present

## 2013-12-04 LAB — CBC WITH DIFFERENTIAL/PLATELET
BASOS PCT: 0 % (ref 0–1)
Basophils Absolute: 0 10*3/uL (ref 0.0–0.1)
Eosinophils Absolute: 0.1 10*3/uL (ref 0.0–0.7)
Eosinophils Relative: 1 % (ref 0–5)
HCT: 38.2 % (ref 36.0–46.0)
Hemoglobin: 12.8 g/dL (ref 12.0–15.0)
Lymphocytes Relative: 35 % (ref 12–46)
Lymphs Abs: 2.6 10*3/uL (ref 0.7–4.0)
MCH: 31.4 pg (ref 26.0–34.0)
MCHC: 33.5 g/dL (ref 30.0–36.0)
MCV: 93.6 fL (ref 78.0–100.0)
MONOS PCT: 7 % (ref 3–12)
MPV: 8.9 fL — AB (ref 9.4–12.4)
Monocytes Absolute: 0.5 10*3/uL (ref 0.1–1.0)
NEUTROS ABS: 4.3 10*3/uL (ref 1.7–7.7)
NEUTROS PCT: 57 % (ref 43–77)
Platelets: 239 10*3/uL (ref 150–400)
RBC: 4.08 MIL/uL (ref 3.87–5.11)
RDW: 15.3 % (ref 11.5–15.5)
WBC: 7.5 10*3/uL (ref 4.0–10.5)

## 2013-12-04 LAB — POCT URINALYSIS DIPSTICK
BILIRUBIN UA: NEGATIVE
Glucose, UA: NEGATIVE
Ketones, UA: NEGATIVE
LEUKOCYTES UA: NEGATIVE
Nitrite, UA: NEGATIVE
Protein, UA: NEGATIVE
SPEC GRAV UA: 1.02
Urobilinogen, UA: 0.2
pH, UA: 5.5

## 2013-12-04 NOTE — Assessment & Plan Note (Signed)
Patient educated about the importance of limiting  Carbohydrate intake , the need to commit to daily physical activity for a minimum of 30 minutes , and to commit weight loss. The fact that changes in all these areas will reduce or eliminate all together the development of diabetes is stressed.   Updated lab needed at/ before next visit.  

## 2013-12-04 NOTE — Assessment & Plan Note (Signed)
Notes reduced vision refer for eye exam

## 2013-12-04 NOTE — Progress Notes (Signed)
   Subjective:    Patient ID: Stacy Meyers, female    DOB: 1932/09/03, 80 y.o.   MRN: [id number]  HPI The PT is here for follow up and re-evaluation of chronic medical conditions, medication management and review of any available recent lab and radiology data.  Preventive health is updated, specifically  Cancer screening and Immunization.   Questions or concerns regarding consultations or procedures which the PT has had in the interim are  addressed. The PT denies any adverse reactions to current medications since the last visit.  C/o excess urination esp at night, excess thirst, fatigue and reduced vision, has been over eating raisins to help her excess urination unfortunately and has had HBa1C of 6.5 earlier this  Year Marked improvement in joint pain and stiffness, and some days walsk as much as 2 miles     Review of Systems See HPI Denies recent fever or chills. Denies sinus pressure, nasal congestion, ear pain or sore throat. Denies chest congestion, productive cough or wheezing. Denies chest pains, palpitations and leg swelling Denies abdominal pain, nausea, vomiting,diarrhea or constipation.   Denies uncontrolled  joint pain, swelling and limitation in mobility. Denies headaches, seizures, numbness, or tingling. Denies depression, anxiety or insomnia. Denies skin break down or rash.        Objective:   Physical Exam BP 136/70 mmHg  Pulse 61  Resp 16  Ht 5' (1.524 m)  Wt 148 lb (67.132 kg)  BMI 28.90 kg/m2  SpO2 96% Patient alert and oriented and in no cardiopulmonary distress.  HEENT: No facial asymmetry, EOMI,   oropharynx pink and moist.  Neck supple no JVD, no mass.  Chest: Clear to auscultation bilaterally.  CVS: S1, S2 no murmurs, no S3.Regular rate.  ABD: Soft non tender.   Ext: No edema  MS: Adequate though reduced  ROM spine, shoulders, hips and knees.Swelling and deformity of IP joints  Skin: Intact, no ulcerations or rash noted.  Psych: Good  eye contact, normal affect. Memory intact not anxious or depressed appearing.  CNS: CN 2-12 intact, power,  normal throughout.no focal deficits noted.        Assessment & Plan:  Essential hypertension, benign Controlled, no change in medication DASH diet and commitment to daily physical activity for a minimum of 30 minutes discussed and encouraged, as a part of hypertension management. The importance of attaining a healthy weight is also discussed.   Impaired fasting glucose Patient educated about the importance of limiting  Carbohydrate intake , the need to commit to daily physical activity for a minimum of 30 minutes , and to commit weight loss. The fact that changes in all these areas will reduce or eliminate all together the development of diabetes is stressed.   Updated lab needed at/ before next visit.   Rheumatoid arthritis Improvement in pain stiffness and function since starting care by rheumatology  Hyperlipidemia Controlled, no change in medication Hyperlipidemia:Low fat diet discussed and encouraged.  Updated lab needed at/ before next visit.   Nocturia urianlysis in office is slightly abnormal, will send on for further testing  Poor vision Notes reduced vision refer for eye exam

## 2013-12-04 NOTE — Assessment & Plan Note (Signed)
Improvement in pain stiffness and function since starting care by rheumatology

## 2013-12-04 NOTE — Assessment & Plan Note (Signed)
urianlysis in office is slightly abnormal, will send on for further testing

## 2013-12-04 NOTE — Patient Instructions (Addendum)
F/u in 3.5 month, call if you need me before  You need HBA1C, cmp and EGFR, CBC, we are checking to see if you need to return for rept blood draw today  Urine is being tested for infection, stop eating excess raisins as this raises your blood sugar please   You are referred for eye exam  Continue to keep active  Need HBA1C, fasting lipid , cmp and eGFR in 3.5 month 

## 2013-12-04 NOTE — Assessment & Plan Note (Signed)
Controlled, no change in medication Hyperlipidemia:Low fat diet discussed and encouraged.  Updated lab needed at/ before next visit.  

## 2013-12-04 NOTE — Assessment & Plan Note (Signed)
Controlled, no change in medication DASH diet and commitment to daily physical activity for a minimum of 30 minutes discussed and encouraged, as a part of hypertension management. The importance of attaining a healthy weight is also discussed.  

## 2013-12-05 LAB — COMPLETE METABOLIC PANEL WITH GFR
ALBUMIN: 4 g/dL (ref 3.5–5.2)
ALT: 9 U/L (ref 0–35)
AST: 15 U/L (ref 0–37)
Alkaline Phosphatase: 50 U/L (ref 39–117)
BUN: 22 mg/dL (ref 6–23)
CALCIUM: 10 mg/dL (ref 8.4–10.5)
CHLORIDE: 106 meq/L (ref 96–112)
CO2: 25 meq/L (ref 19–32)
Creat: 1.02 mg/dL (ref 0.50–1.10)
GFR, EST AFRICAN AMERICAN: 60 mL/min
GFR, Est Non African American: 52 mL/min — ABNORMAL LOW
GLUCOSE: 93 mg/dL (ref 70–99)
POTASSIUM: 3.8 meq/L (ref 3.5–5.3)
SODIUM: 142 meq/L (ref 135–145)
TOTAL PROTEIN: 6.4 g/dL (ref 6.0–8.3)
Total Bilirubin: 0.5 mg/dL (ref 0.2–1.2)

## 2013-12-05 LAB — HEMOGLOBIN A1C
Hgb A1c MFr Bld: 6.3 % — ABNORMAL HIGH (ref <5.7)
Mean Plasma Glucose: 134 mg/dL — ABNORMAL HIGH (ref <117)

## 2013-12-05 NOTE — Addendum Note (Signed)
Addended by: Eual Fines on: 12/05/2013 08:16 AM   Modules accepted: Orders

## 2013-12-06 LAB — URINE CULTURE
Colony Count: NO GROWTH
Organism ID, Bacteria: NO GROWTH

## 2013-12-13 ENCOUNTER — Other Ambulatory Visit: Payer: Self-pay | Admitting: Family Medicine

## 2014-01-02 ENCOUNTER — Other Ambulatory Visit: Payer: Self-pay | Admitting: Family Medicine

## 2014-02-02 DIAGNOSIS — M2550 Pain in unspecified joint: Secondary | ICD-10-CM | POA: Diagnosis not present

## 2014-02-27 DIAGNOSIS — Z79899 Other long term (current) drug therapy: Secondary | ICD-10-CM | POA: Diagnosis not present

## 2014-03-02 DIAGNOSIS — M0579 Rheumatoid arthritis with rheumatoid factor of multiple sites without organ or systems involvement: Secondary | ICD-10-CM | POA: Diagnosis not present

## 2014-03-02 DIAGNOSIS — M810 Age-related osteoporosis without current pathological fracture: Secondary | ICD-10-CM | POA: Diagnosis not present

## 2014-03-02 DIAGNOSIS — R899 Unspecified abnormal finding in specimens from other organs, systems and tissues: Secondary | ICD-10-CM | POA: Diagnosis not present

## 2014-03-02 DIAGNOSIS — Z79899 Other long term (current) drug therapy: Secondary | ICD-10-CM | POA: Diagnosis not present

## 2014-03-10 ENCOUNTER — Other Ambulatory Visit: Payer: Self-pay | Admitting: Family Medicine

## 2014-03-20 ENCOUNTER — Other Ambulatory Visit: Payer: Self-pay | Admitting: Family Medicine

## 2014-03-20 DIAGNOSIS — Z1231 Encounter for screening mammogram for malignant neoplasm of breast: Secondary | ICD-10-CM

## 2014-03-21 ENCOUNTER — Ambulatory Visit (HOSPITAL_COMMUNITY)
Admission: RE | Admit: 2014-03-21 | Discharge: 2014-03-21 | Disposition: A | Discharge status: Home / Self Care | Payer: Medicare Other | Source: Ambulatory Visit | Attending: Family Medicine | Admitting: Family Medicine

## 2014-03-21 DIAGNOSIS — Z1231 Encounter for screening mammogram for malignant neoplasm of breast: Secondary | ICD-10-CM | POA: Diagnosis not present

## 2014-03-30 DIAGNOSIS — E785 Hyperlipidemia, unspecified: Secondary | ICD-10-CM | POA: Diagnosis not present

## 2014-03-30 DIAGNOSIS — R7309 Other abnormal glucose: Secondary | ICD-10-CM | POA: Diagnosis not present

## 2014-03-30 LAB — LIPID PANEL
CHOL/HDL RATIO: 1.8 ratio
CHOLESTEROL: 120 mg/dL (ref 0–200)
HDL: 65 mg/dL (ref >=46)
LDL Cholesterol: 38 mg/dL (ref 0–99)
Triglycerides: 84 mg/dL (ref <150)
VLDL: 17 mg/dL (ref 0–40)

## 2014-03-30 LAB — HEMOGLOBIN A1C
Hgb A1c MFr Bld: 6.6 % — ABNORMAL HIGH (ref <5.7)
MEAN PLASMA GLUCOSE: 143 mg/dL — AB (ref <117)

## 2014-03-31 ENCOUNTER — Other Ambulatory Visit: Payer: Self-pay | Admitting: Family Medicine

## 2014-04-03 ENCOUNTER — Ambulatory Visit (INDEPENDENT_AMBULATORY_CARE_PROVIDER_SITE_OTHER): Payer: Medicare Other | Admitting: Family Medicine

## 2014-04-03 ENCOUNTER — Encounter: Payer: Self-pay | Admitting: Family Medicine

## 2014-04-03 VITALS — BP 120/70 | HR 60 | Resp 16 | Ht 60.0 in | Wt 147.4 lb

## 2014-04-03 DIAGNOSIS — R7309 Other abnormal glucose: Secondary | ICD-10-CM | POA: Diagnosis not present

## 2014-04-03 DIAGNOSIS — M069 Rheumatoid arthritis, unspecified: Secondary | ICD-10-CM | POA: Diagnosis not present

## 2014-04-03 DIAGNOSIS — R7303 Prediabetes: Secondary | ICD-10-CM

## 2014-04-03 DIAGNOSIS — I10 Essential (primary) hypertension: Secondary | ICD-10-CM | POA: Diagnosis not present

## 2014-04-03 DIAGNOSIS — I5189 Other ill-defined heart diseases: Secondary | ICD-10-CM

## 2014-04-03 DIAGNOSIS — J302 Other seasonal allergic rhinitis: Secondary | ICD-10-CM | POA: Diagnosis not present

## 2014-04-03 DIAGNOSIS — I519 Heart disease, unspecified: Secondary | ICD-10-CM

## 2014-04-03 DIAGNOSIS — E663 Overweight: Secondary | ICD-10-CM

## 2014-04-03 DIAGNOSIS — E785 Hyperlipidemia, unspecified: Secondary | ICD-10-CM

## 2014-04-03 NOTE — Patient Instructions (Signed)
Annual Wellness in 4 month, call if you need me before  It is important that you exercise regularly at least 30 minutes 5 times a week. If you develop chest pain, have severe difficulty breathing, or feel very tired, stop exercising immediately and seek medical attention    Labs are excellent  Non fast cmp and EGFR in May when you are getting labs  Stop sweet tea so blood sugar improves

## 2014-04-04 ENCOUNTER — Other Ambulatory Visit: Payer: Self-pay | Admitting: Family Medicine

## 2014-04-28 DIAGNOSIS — E663 Overweight: Secondary | ICD-10-CM | POA: Insufficient documentation

## 2014-04-28 NOTE — Assessment & Plan Note (Signed)
Deteriorated, pt diabetic managed with diet, now on daily prednisone for rheumatoid arthritis.  Patient educated about the importance of limiting  Carbohydrate intake , the need to commit to daily physical activity for a minimum of 30 minutes , and to commit weight loss. The fact that changes in all these areas will reduce or eliminate all together the need to take medication for  diabetes is stressed.   Diabetic Labs Latest Ref Rng 03/30/2014 12/04/2013 07/31/2013 01/10/2013 06/01/2012  HbA1c <5.7 % 6.6(H) 6.3(H) 6.5(H) - -  Chol 0 - 200 mg/dL 120 - 127 148 -  HDL >=46 mg/dL 65 - 59 60 -  Calc LDL 0 - 99 mg/dL 38 - 53 71 -  Triglycerides <150 mg/dL 84 - 77 83 -  Creatinine 0.50 - 1.10 mg/dL - 1.02 1.03 0.89 0.81   BP/Weight 04/03/2014 12/04/2013 08/03/2013 02/16/2013 10/13/2012 07/27/2012 04/23/6220  Systolic BP 979 892 119 417 408 144 818  Diastolic BP 70 70 70 80 70 82 77  Wt. (Lbs) 147.4 148 148.12 148 144 144.8 142  BMI 28.79 28.9 29.43 29.88 28.61 30.27 29.69   No flowsheet data found.

## 2014-04-28 NOTE — Assessment & Plan Note (Signed)
Controlled, no change in medication Hyperlipidemia:Low fat diet discussed and encouraged.   Lipid Panel  Lab Results  Component Value Date   CHOL 120 03/30/2014   HDL 65 03/30/2014   LDLCALC 38 03/30/2014   TRIG 84 03/30/2014   CHOLHDL 1.8 03/30/2014

## 2014-04-28 NOTE — Assessment & Plan Note (Signed)
Controlled, no change in medication  

## 2014-04-28 NOTE — Progress Notes (Signed)
Stacy Meyers     MRN: [id number]      DOB: 11-Dec-1932   HPI Stacy Meyers is here for follow up and re-evaluation of chronic medical conditions, medication management and review of any available recent lab and radiology data.  Preventive health is updated, specifically  Cancer screening and Immunization.   Questions or concerns regarding consultations or procedures which the PT has had in the interim are  addressed. The PT denies any adverse reactions to current medications since the last visit.  There are no new concerns.  There are no specific complaints   ROS Denies recent fever or chills. Denies sinus pressure, nasal congestion, ear pain or sore throat. Denies chest congestion, productive cough or wheezing. Denies chest pains, palpitations and leg swelling Denies abdominal pain, nausea, vomiting,diarrhea or constipation.   Denies dysuria, frequency, hesitancy or incontinence. Denies joint pain, swelling and limitation in mobility. Denies headaches, seizures, numbness, or tingling. Denies depression, anxiety or insomnia. Denies skin break down or rash.   PE  BP 120/70 mmHg  Pulse 60  Resp 16  Ht 5' (1.524 m)  Wt 147 lb 6.4 oz (66.86 kg)  BMI 28.79 kg/m2  SpO2 98%  Patient alert and oriented and in no cardiopulmonary distress.  HEENT: No facial asymmetry, EOMI,   oropharynx pink and moist.  Neck supple no JVD, no mass.  Chest: Clear to auscultation bilaterally.  CVS: S1, S2 no murmurs, no S3.Regular rate.  ABD: Soft non tender.   Ext: No edema  MS: Adequate ROM spine, shoulders, hips and knees.  Skin: Intact, no ulcerations or rash noted.  Psych: Good eye contact, normal affect. Memory intact not anxious or depressed appearing.  CNS: CN 2-12 intact, power,  normal throughout.no focal deficits noted.   Assessment & Plan   Essential hypertension, benign Controlled, no change in medication DASH diet and commitment to daily physical activity for a minimum of 30  minutes discussed and encouraged, as a part of hypertension management. The importance of attaining a healthy weight is also discussed.  BP/Weight 04/03/2014 12/04/2013 08/03/2013 02/16/2013 10/13/2012 07/27/2012 0/16/0109  Systolic BP 323 557 322 025 427 062 376  Diastolic BP 70 70 70 80 70 82 77  Wt. (Lbs) 147.4 148 148.12 148 144 144.8 142  BMI 28.79 28.9 29.43 29.88 28.61 30.27 29.69         Seasonal allergies Controlled, no change in medication    Prediabetes Deteriorated, pt diabetic managed with diet, now on daily prednisone for rheumatoid arthritis.  Patient educated about the importance of limiting  Carbohydrate intake , the need to commit to daily physical activity for a minimum of 30 minutes , and to commit weight loss. The fact that changes in all these areas will reduce or eliminate all together the need to take medication for  diabetes is stressed.   Diabetic Labs Latest Ref Rng 03/30/2014 12/04/2013 07/31/2013 01/10/2013 06/01/2012  HbA1c <5.7 % 6.6(H) 6.3(H) 6.5(H) - -  Chol 0 - 200 mg/dL 120 - 127 148 -  HDL >=46 mg/dL 65 - 59 60 -  Calc LDL 0 - 99 mg/dL 38 - 53 71 -  Triglycerides <150 mg/dL 84 - 77 83 -  Creatinine 0.50 - 1.10 mg/dL - 1.02 1.03 0.89 0.81   BP/Weight 04/03/2014 12/04/2013 08/03/2013 02/16/2013 10/13/2012 07/27/2012 2/83/1517  Systolic BP 616 073 710 626 948 546 270  Diastolic BP 70 70 70 80 70 82 77  Wt. (Lbs) 147.4 148 148.12 148 144 144.8  142  BMI 28.79 28.9 29.43 29.88 28.61 30.27 29.69   No flowsheet data found.      Rheumatoid arthritis Marked improvement in pain control and function of hands, continue with rheumatology   Hyperlipidemia LDL goal <100 Controlled, no change in medication Hyperlipidemia:Low fat diet discussed and encouraged.   Lipid Panel  Lab Results  Component Value Date   CHOL 120 03/30/2014   HDL 65 03/30/2014   LDLCALC 38 03/30/2014   TRIG 84 03/30/2014   CHOLHDL 1.8 76/22/6333         Diastolic  dysfunction Asymptomatic and stable   Overweight (BMI 25.0-29.9) Unchanged Patient re-educated about  the importance of commitment to a  minimum of 150 minutes of exercise per week.  The importance of healthy food choices with portion control discussed. Encouraged to start a food diary, count calories and to consider  joining a support group. Sample diet sheets offered. Goals set by the patient for the next several months.   Weight /BMI 04/03/2014 12/04/2013 08/03/2013  WEIGHT 147 lb 6.4 oz 148 lb 148 lb 1.9 oz  HEIGHT 5\' 0"  5\' 0"  4' 11.5"  BMI 28.79 kg/m2 28.9 kg/m2 29.43 kg/m2    Current exercise per week 150 minutes.

## 2014-04-28 NOTE — Assessment & Plan Note (Signed)
Asymptomatic and stable 

## 2014-04-28 NOTE — Assessment & Plan Note (Signed)
Controlled, no change in medication DASH diet and commitment to daily physical activity for a minimum of 30 minutes discussed and encouraged, as a part of hypertension management. The importance of attaining a healthy weight is also discussed.  BP/Weight 04/03/2014 12/04/2013 08/03/2013 02/16/2013 10/13/2012 07/27/2012 2/84/1324  Systolic BP 401 027 253 664 403 474 259  Diastolic BP 70 70 70 80 70 82 77  Wt. (Lbs) 147.4 148 148.12 148 144 144.8 142  BMI 28.79 28.9 29.43 29.88 28.61 30.27 29.69

## 2014-04-28 NOTE — Assessment & Plan Note (Signed)
Marked improvement in pain control and function of hands, continue with rheumatology

## 2014-04-28 NOTE — Assessment & Plan Note (Signed)
Unchanged Patient re-educated about  the importance of commitment to a  minimum of 150 minutes of exercise per week.  The importance of healthy food choices with portion control discussed. Encouraged to start a food diary, count calories and to consider  joining a support group. Sample diet sheets offered. Goals set by the patient for the next several months.   Weight /BMI 04/03/2014 12/04/2013 08/03/2013  WEIGHT 147 lb 6.4 oz 148 lb 148 lb 1.9 oz  HEIGHT 5\' 0"  5\' 0"  4' 11.5"  BMI 28.79 kg/m2 28.9 kg/m2 29.43 kg/m2    Current exercise per week 150 minutes.

## 2014-04-30 DIAGNOSIS — Z79899 Other long term (current) drug therapy: Secondary | ICD-10-CM | POA: Diagnosis not present

## 2014-05-04 DIAGNOSIS — N182 Chronic kidney disease, stage 2 (mild): Secondary | ICD-10-CM | POA: Diagnosis not present

## 2014-05-04 DIAGNOSIS — N179 Acute kidney failure, unspecified: Secondary | ICD-10-CM | POA: Diagnosis not present

## 2014-05-07 ENCOUNTER — Other Ambulatory Visit: Payer: Self-pay | Admitting: Nephrology

## 2014-05-07 DIAGNOSIS — N182 Chronic kidney disease, stage 2 (mild): Secondary | ICD-10-CM

## 2014-05-10 ENCOUNTER — Ambulatory Visit: Payer: Medicare Other

## 2014-05-10 ENCOUNTER — Ambulatory Visit
Admission: RE | Admit: 2014-05-10 | Discharge: 2014-05-10 | Disposition: A | Discharge status: Home / Self Care | Payer: Medicare Other | Source: Ambulatory Visit | Attending: Nephrology | Admitting: Nephrology

## 2014-05-10 DIAGNOSIS — N189 Chronic kidney disease, unspecified: Secondary | ICD-10-CM | POA: Diagnosis not present

## 2014-05-10 DIAGNOSIS — N182 Chronic kidney disease, stage 2 (mild): Secondary | ICD-10-CM

## 2014-05-11 ENCOUNTER — Other Ambulatory Visit: Payer: Medicare Other

## 2014-05-14 ENCOUNTER — Ambulatory Visit (INDEPENDENT_AMBULATORY_CARE_PROVIDER_SITE_OTHER): Payer: Medicare Other

## 2014-05-14 VITALS — Wt 147.0 lb

## 2014-05-14 DIAGNOSIS — Z7689 Persons encountering health services in other specified circumstances: Secondary | ICD-10-CM | POA: Diagnosis not present

## 2014-05-14 DIAGNOSIS — Z111 Encounter for screening for respiratory tuberculosis: Secondary | ICD-10-CM

## 2014-05-16 LAB — TB SKIN TEST
Induration: 0 mm
TB Skin Test: NEGATIVE

## 2014-05-28 DIAGNOSIS — M0579 Rheumatoid arthritis with rheumatoid factor of multiple sites without organ or systems involvement: Secondary | ICD-10-CM | POA: Diagnosis not present

## 2014-05-28 DIAGNOSIS — M810 Age-related osteoporosis without current pathological fracture: Secondary | ICD-10-CM | POA: Diagnosis not present

## 2014-05-28 DIAGNOSIS — Z79899 Other long term (current) drug therapy: Secondary | ICD-10-CM | POA: Diagnosis not present

## 2014-05-28 DIAGNOSIS — N189 Chronic kidney disease, unspecified: Secondary | ICD-10-CM | POA: Diagnosis not present

## 2014-06-13 DIAGNOSIS — N182 Chronic kidney disease, stage 2 (mild): Secondary | ICD-10-CM | POA: Diagnosis not present

## 2014-06-13 DIAGNOSIS — N179 Acute kidney failure, unspecified: Secondary | ICD-10-CM | POA: Diagnosis not present

## 2014-06-19 ENCOUNTER — Telehealth: Payer: Self-pay | Admitting: Family Medicine

## 2014-06-19 NOTE — Telephone Encounter (Signed)
Pls le her know I have a msg from her kidney Doc requesting her to stop calcium and D and she needs to follow this advice based on hisercommendations, thanks

## 2014-06-22 NOTE — Telephone Encounter (Signed)
Patient has stopped the med as recommended

## 2014-07-04 ENCOUNTER — Other Ambulatory Visit: Payer: Self-pay | Admitting: Family Medicine

## 2014-07-19 DIAGNOSIS — N183 Chronic kidney disease, stage 3 (moderate): Secondary | ICD-10-CM | POA: Diagnosis not present

## 2014-07-19 DIAGNOSIS — N179 Acute kidney failure, unspecified: Secondary | ICD-10-CM | POA: Diagnosis not present

## 2014-07-19 DIAGNOSIS — N182 Chronic kidney disease, stage 2 (mild): Secondary | ICD-10-CM | POA: Diagnosis not present

## 2014-07-23 ENCOUNTER — Other Ambulatory Visit: Payer: Self-pay | Admitting: Family Medicine

## 2014-07-26 DIAGNOSIS — Z79899 Other long term (current) drug therapy: Secondary | ICD-10-CM | POA: Diagnosis not present

## 2014-07-30 DIAGNOSIS — N189 Chronic kidney disease, unspecified: Secondary | ICD-10-CM | POA: Diagnosis not present

## 2014-07-30 DIAGNOSIS — M810 Age-related osteoporosis without current pathological fracture: Secondary | ICD-10-CM | POA: Diagnosis not present

## 2014-07-30 DIAGNOSIS — M0579 Rheumatoid arthritis with rheumatoid factor of multiple sites without organ or systems involvement: Secondary | ICD-10-CM | POA: Diagnosis not present

## 2014-07-30 DIAGNOSIS — M5137 Other intervertebral disc degeneration, lumbosacral region: Secondary | ICD-10-CM | POA: Diagnosis not present

## 2014-09-03 ENCOUNTER — Encounter: Payer: Self-pay | Admitting: Family Medicine

## 2014-09-03 ENCOUNTER — Ambulatory Visit (INDEPENDENT_AMBULATORY_CARE_PROVIDER_SITE_OTHER): Payer: Medicare Other | Admitting: Family Medicine

## 2014-09-03 VITALS — BP 142/70 | HR 64 | Resp 18 | Ht 60.0 in | Wt 146.0 lb

## 2014-09-03 DIAGNOSIS — Z23 Encounter for immunization: Secondary | ICD-10-CM | POA: Diagnosis not present

## 2014-09-03 DIAGNOSIS — R7309 Other abnormal glucose: Secondary | ICD-10-CM

## 2014-09-03 DIAGNOSIS — R7303 Prediabetes: Secondary | ICD-10-CM

## 2014-09-03 DIAGNOSIS — I10 Essential (primary) hypertension: Secondary | ICD-10-CM

## 2014-09-03 DIAGNOSIS — Z0000 Encounter for general adult medical examination without abnormal findings: Secondary | ICD-10-CM | POA: Insufficient documentation

## 2014-09-03 DIAGNOSIS — E785 Hyperlipidemia, unspecified: Secondary | ICD-10-CM

## 2014-09-03 NOTE — Progress Notes (Signed)
Subjective:    Patient ID: Stacy Meyers, female    DOB: [birth year], 81 y.o.   MRN: [id number]  HPI Preventive Screening-Counseling & Management   Patient present here today for a Medicare annual wellness visit.   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 No  Over the past two weeks, have you felt little interest or pleasure in doing things? No  Have you lost interest or pleasure in daily life? No  Do you often feel hopeless? No  Do you cry easily over simple problems? No   Activities of Daily Living  In your present state of health, do you have any difficulty performing the following activities?  Driving?: No Managing money?: No Feeding yourself?:No Getting from bed to chair?:No Climbing a flight of stairs?:No Preparing food and eating?:No Bathing or showering?:No Getting dressed?:No Getting to the toilet?:No Using the toilet?:No Moving around from place to place?: No  Fall Risk Assessment In the past year have you fallen or had a near fall?:No Are you currently taking any medications that make you dizzy?:No   Hearing Difficulties: No Do you often ask people to speak up or repeat themselves?:No Do you experience ringing or noises in your ears?:No Do you have difficulty understanding soft or whispered voices?:No  Cognitive Testing  Alert? Yes Normal Appearance?Yes  Oriented to person? Yes Place? Yes  Time? Yes    Displays appropriate judgment?Yes  Can read the correct time from a watch face? yes Are you having problems remembering things?No age appropriate  Advanced Directives have been discussed with the patient?Yes and brochure provided, full code   List the Names of Other Physician/Practitioners you currently use:  careteams updated   Indicate any recent Medical Services you may have received from other than [facility] providers in the past year (date may be approximate).   Assessment:    Annual Wellness Exam   Plan:    Medicare Attestation  I have personally reviewed:  The patient's medical and social history  Their use of alcohol, tobacco or illicit drugs  Their current medications and supplements  The patient's functional ability including ADLs,fall risks, home safety risks, cognitive, and hearing and visual impairment  Diet and physical activities  Evidence for depression or mood disorders  The patient's weight, height, BMI, and visual acuity have been recorded in the chart. I have made referrals, counseling, and provided education to the patient based on review of the above and I have provided the patient with a written personalized care plan for preventive services.      Review of Systems     Objective:   Physical Exam BP 142/70 mmHg  Pulse 64  Resp 18  Ht 5' (1.524 m)  Wt 146 lb 0.6 oz (66.243 kg)  BMI 28.52 kg/m2  SpO2 95%        Assessment & Plan:  Medicare annual wellness visit, subsequent Annual exam as documented. Counseling done  re healthy lifestyle  involving commitment to 150 minutes exercise per week, heart healthy diet, and attaining healthy weight.The importance of adequate sleep also discussed. Regular seat belt use and home safety, is also discussed. Changes in health habits are decided on by the patient with goals and time frames  set for achieving them. Immunization and cancer screening needs are specifically addressed at this visit.   Need for  prophylactic vaccination and inoculation against influenza After obtaining informed consent, the vaccine is  administered by LPN.

## 2014-09-03 NOTE — Patient Instructions (Signed)
F/u in January, call if you need me before  Fasting labs in January  Keep up excellent health habits, eat more vegetables , and less salt so that blood pressure inmproves  Flu vaccine today  Please work on good  health habits so that your health will improve. 1. Commitment to daily physical activity for 30 to 60  minutes, if you are able to do this.  2. Commitment to wise food choices. Aim for half of your  food intake to be vegetable and fruit, one quarter starchy foods, and one quarter protein. Try to eat on a regular schedule  3 meals per day, snacking between meals should be limited to vegetables or fruits or small portions of nuts. 64 ounces of water per day is generally recommended, unless you have specific health conditions, like heart failure or kidney failure where you will need to limit fluid intake.  3. Commitment to sufficient and a  good quality of physical and mental rest daily, generally between 6 to 8 hours per day.  WITH PERSISTANCE AND PERSEVERANCE, THE IMPOSSIBLE , BECOMES THE NORM!  Thanks for choosing [facility], we consider it a privelige to serve you.

## 2014-09-03 NOTE — Assessment & Plan Note (Signed)

## 2014-09-10 ENCOUNTER — Encounter: Payer: Self-pay | Admitting: Family Medicine

## 2014-09-10 DIAGNOSIS — Z23 Encounter for immunization: Secondary | ICD-10-CM | POA: Insufficient documentation

## 2014-09-10 NOTE — Assessment & Plan Note (Signed)
After obtaining informed consent, the vaccine is  administered by LPN.  

## 2014-09-12 ENCOUNTER — Other Ambulatory Visit: Payer: Self-pay | Admitting: Family Medicine

## 2014-09-15 ENCOUNTER — Other Ambulatory Visit: Payer: Self-pay | Admitting: Family Medicine

## 2014-09-21 ENCOUNTER — Other Ambulatory Visit: Payer: Self-pay | Admitting: Family Medicine

## 2014-10-03 ENCOUNTER — Other Ambulatory Visit: Payer: Self-pay | Admitting: Family Medicine

## 2014-10-15 ENCOUNTER — Other Ambulatory Visit: Payer: Self-pay | Admitting: Family Medicine

## 2014-11-05 DIAGNOSIS — Z79899 Other long term (current) drug therapy: Secondary | ICD-10-CM | POA: Diagnosis not present

## 2014-12-19 ENCOUNTER — Other Ambulatory Visit: Payer: Self-pay | Admitting: Family Medicine

## 2015-01-24 DIAGNOSIS — E785 Hyperlipidemia, unspecified: Secondary | ICD-10-CM | POA: Diagnosis not present

## 2015-01-24 DIAGNOSIS — Z79899 Other long term (current) drug therapy: Secondary | ICD-10-CM | POA: Diagnosis not present

## 2015-01-24 DIAGNOSIS — I10 Essential (primary) hypertension: Secondary | ICD-10-CM | POA: Diagnosis not present

## 2015-01-24 DIAGNOSIS — R7309 Other abnormal glucose: Secondary | ICD-10-CM | POA: Diagnosis not present

## 2015-01-25 DIAGNOSIS — M810 Age-related osteoporosis without current pathological fracture: Secondary | ICD-10-CM | POA: Diagnosis not present

## 2015-01-25 DIAGNOSIS — M4056 Lordosis, unspecified, lumbar region: Secondary | ICD-10-CM | POA: Diagnosis not present

## 2015-01-25 DIAGNOSIS — M0579 Rheumatoid arthritis with rheumatoid factor of multiple sites without organ or systems involvement: Secondary | ICD-10-CM | POA: Diagnosis not present

## 2015-01-25 DIAGNOSIS — Z79899 Other long term (current) drug therapy: Secondary | ICD-10-CM | POA: Diagnosis not present

## 2015-01-25 LAB — COMPLETE METABOLIC PANEL WITH GFR
ALBUMIN: 4.1 g/dL (ref 3.6–5.1)
ALT: 10 U/L (ref 6–29)
AST: 16 U/L (ref 10–35)
Alkaline Phosphatase: 60 U/L (ref 33–130)
BILIRUBIN TOTAL: 0.6 mg/dL (ref 0.2–1.2)
BUN: 21 mg/dL (ref 7–25)
CALCIUM: 10 mg/dL (ref 8.6–10.4)
CO2: 28 mmol/L (ref 20–31)
CREATININE: 1.08 mg/dL — AB (ref 0.60–0.88)
Chloride: 103 mmol/L (ref 98–110)
GFR, Est African American: 56 mL/min — ABNORMAL LOW (ref >=60)
GFR, Est Non African American: 49 mL/min — ABNORMAL LOW (ref >=60)
Glucose, Bld: 89 mg/dL (ref 65–99)
Potassium: 4.1 mmol/L (ref 3.5–5.3)
Sodium: 139 mmol/L (ref 135–146)
Total Protein: 6.3 g/dL (ref 6.1–8.1)

## 2015-01-25 LAB — LIPID PANEL
CHOL/HDL RATIO: 2.4 ratio (ref <=5.0)
CHOLESTEROL: 125 mg/dL (ref 125–200)
HDL: 52 mg/dL (ref >=46)
LDL Cholesterol: 51 mg/dL (ref <130)
TRIGLYCERIDES: 111 mg/dL (ref <150)
VLDL: 22 mg/dL (ref <30)

## 2015-01-25 LAB — TSH: TSH: 2.354 u[IU]/mL (ref 0.350–4.500)

## 2015-01-25 LAB — HEMOGLOBIN A1C
Hgb A1c MFr Bld: 6.6 % — ABNORMAL HIGH (ref <5.7)
Mean Plasma Glucose: 143 mg/dL — ABNORMAL HIGH (ref <117)

## 2015-02-04 ENCOUNTER — Ambulatory Visit (INDEPENDENT_AMBULATORY_CARE_PROVIDER_SITE_OTHER): Payer: Medicare Other | Admitting: Family Medicine

## 2015-02-04 ENCOUNTER — Other Ambulatory Visit: Payer: Self-pay | Admitting: Family Medicine

## 2015-02-04 ENCOUNTER — Encounter: Payer: Self-pay | Admitting: Family Medicine

## 2015-02-04 VITALS — BP 130/74 | HR 60 | Resp 18 | Ht 60.0 in | Wt 143.0 lb

## 2015-02-04 DIAGNOSIS — B009 Herpesviral infection, unspecified: Secondary | ICD-10-CM

## 2015-02-04 DIAGNOSIS — E785 Hyperlipidemia, unspecified: Secondary | ICD-10-CM | POA: Diagnosis not present

## 2015-02-04 DIAGNOSIS — E663 Overweight: Secondary | ICD-10-CM

## 2015-02-04 DIAGNOSIS — M069 Rheumatoid arthritis, unspecified: Secondary | ICD-10-CM

## 2015-02-04 DIAGNOSIS — I10 Essential (primary) hypertension: Secondary | ICD-10-CM | POA: Diagnosis not present

## 2015-02-04 MED ORDER — ROSUVASTATIN CALCIUM 5 MG PO TABS: ORAL_TABLET | ORAL | Status: DC

## 2015-02-04 NOTE — Patient Instructions (Signed)
Annual physical exam in 4 month, call if you need me sooner  HAPPY BIRTHDAY in [place]...83!  Fasting lab in 4 months  REDUCE crestor to 5mg  tablet, though script states take every day, please take every other day  Follow diet rich in vegetable and fruit and keep active  Care not to fall  Thankful you feel and are doing well!

## 2015-02-04 NOTE — Progress Notes (Signed)
Subjective:    Patient ID: Stacy Meyers, female    DOB: 11-02-1932, 82 y.o.   MRN: [id number]  HPI   Stacy Meyers     MRN: [id number]      DOB: February 07, 1932   HPI Stacy Meyers is here for follow up and re-evaluation of chronic medical conditions, medication management and review of any available recent lab and radiology data.  Preventive health is updated, specifically  Cancer screening and Immunization.   Questions or concerns regarding consultations or procedures which the PT has had in the interim are  addressed. The PT denies any adverse reactions to current medications since the last visit.  There are no new concerns.  There are no specific complaints   ROS Denies recent fever or chills. Denies sinus pressure, nasal congestion, ear pain or sore throat. Denies chest congestion, productive cough or wheezing. Denies chest pains, palpitations and leg swelling Denies abdominal pain, nausea, vomiting,diarrhea or constipation.   Denies dysuria, frequency, hesitancy or incontinence. Denies uncontrolled joint pain, swelling and limitation in mobility.Reports improved level of function Denies headaches, seizures, numbness, or tingling. Denies depression, anxiety or insomnia. Denies skin break down or rash.   PE  BP 130/74 mmHg  Pulse 60  Resp 18  Ht 5' (1.524 m)  Wt 143 lb (64.864 kg)  BMI 27.93 kg/m2  SpO2 99%  Patient alert and oriented and in no cardiopulmonary distress.  HEENT: No facial asymmetry, EOMI,   oropharynx pink and moist.  Neck supple no JVD, no mass.  Chest: Clear to auscultation bilaterally.  CVS: S1, S2 no murmurs, no S3.Regular rate.  ABD: Soft non tender.   Ext: No edema  MS: Adequate though reduced  ROM spine, shoulders, hips and knees.Deformity of fingers is decreased   Skin: Intact, no ulcerations or rash noted.  Psych: Good eye contact, normal affect. Memory intact not anxious or depressed appearing.  CNS: CN 2-12 intact, power,  normal  throughout.no focal deficits noted.   Assessment & Plan  Essential hypertension, benign Controlled, no change in medication DASH diet and commitment to daily physical activity for a minimum of 30 minutes discussed and encouraged, as a part of hypertension management. The importance of attaining a healthy weight is also discussed.  BP/Weight 02/04/2015 09/03/2014 05/14/2014 04/03/2014 12/04/2013 08/03/2013 [id number]  Systolic BP [id number] - [id number]  Diastolic BP 74 70 - 70 70 70 80  Wt. (Lbs) 143 146.04 147 147.4 148 148.12 148  BMI 27.93 28.52 28.71 28.79 28.9 29.43 29.88        Rheumatoid arthritis Improved pain and function, treated by rheumatology, doing very well, needs daily prednisone for function , attempt at weaning off was unsuccessful  Hyperlipidemia LDL goal <100 Hyperlipidemia:Low fat diet discussed and encouraged.   Lipid Panel  Lab Results  Component Value Date   CHOL 125 01/24/2015   HDL 52 01/24/2015   LDLCALC 51 01/24/2015   TRIG 111 01/24/2015   CHOLHDL 2.4 01/24/2015   Reduce crestor to alt day, may be able to d/c in the future     Herpes simplex virus (HSV) infection Maintains daily antiviral, asymptomatic  Overweight (BMI 25.0-29.9) Improved . Patient re-educated about  the importance of commitment to a  minimum of 150 minutes of exercise per week.  The importance of healthy food choices with portion control discussed. Encouraged to start a food diary, count calories and to consider  joining a support group. Sample diet sheets offered. Goals set by  the patient for the next several months.   Weight /BMI 02/04/2015 09/03/2014 05/14/2014  WEIGHT 143 lb 146 lb 0.6 oz 147 lb  HEIGHT 5\' 0"  5\' 0"  -  BMI 27.93 kg/m2 28.52 kg/m2 28.71 kg/m2    Current exercise per week 120 minutes.       Review of Systems     Objective:   Physical Exam        Assessment & Plan:

## 2015-02-18 NOTE — Assessment & Plan Note (Signed)
Controlled, no change in medication DASH diet and commitment to daily physical activity for a minimum of 30 minutes discussed and encouraged, as a part of hypertension management. The importance of attaining a healthy weight is also discussed.  BP/Weight 02/04/2015 09/03/2014 05/14/2014 04/03/2014 12/04/2013 08/03/2013 [id number]  Systolic BP [id number] - [id number]  Diastolic BP 74 70 - 70 70 70 80  Wt. (Lbs) 143 146.04 147 147.4 148 148.12 148  BMI 27.93 28.52 28.71 28.79 28.9 29.43 29.88

## 2015-02-18 NOTE — Assessment & Plan Note (Signed)
Improved . Patient re-educated about  the importance of commitment to a  minimum of 150 minutes of exercise per week.  The importance of healthy food choices with portion control discussed. Encouraged to start a food diary, count calories and to consider  joining a support group. Sample diet sheets offered. Goals set by the patient for the next several months.   Weight /BMI 02/04/2015 09/03/2014 05/14/2014  WEIGHT 143 lb 146 lb 0.6 oz 147 lb  HEIGHT 5\' 0"  5\' 0"  -  BMI 27.93 kg/m2 28.52 kg/m2 28.71 kg/m2    Current exercise per week 120 minutes.

## 2015-02-18 NOTE — Assessment & Plan Note (Signed)
Maintains daily antiviral, asymptomatic

## 2015-02-18 NOTE — Assessment & Plan Note (Signed)
Hyperlipidemia:Low fat diet discussed and encouraged.   Lipid Panel  Lab Results  Component Value Date   CHOL 125 01/24/2015   HDL 52 01/24/2015   LDLCALC 51 01/24/2015   TRIG 111 01/24/2015   CHOLHDL 2.4 01/24/2015   Reduce crestor to alt day, may be able to d/c in the future

## 2015-02-18 NOTE — Assessment & Plan Note (Addendum)
Improved pain and function, treated by rheumatology, doing very well, needs daily prednisone for function , attempt at weaning off was unsuccessful

## 2015-03-02 ENCOUNTER — Other Ambulatory Visit: Payer: Self-pay | Admitting: Family Medicine

## 2015-04-02 DIAGNOSIS — Z79899 Other long term (current) drug therapy: Secondary | ICD-10-CM | POA: Diagnosis not present

## 2015-04-15 ENCOUNTER — Other Ambulatory Visit: Payer: Self-pay | Admitting: Family Medicine

## 2015-04-20 ENCOUNTER — Other Ambulatory Visit: Payer: Self-pay | Admitting: Family Medicine

## 2015-04-26 ENCOUNTER — Other Ambulatory Visit: Payer: Self-pay | Admitting: Family Medicine

## 2015-04-26 DIAGNOSIS — Z1231 Encounter for screening mammogram for malignant neoplasm of breast: Secondary | ICD-10-CM

## 2015-05-01 ENCOUNTER — Ambulatory Visit (HOSPITAL_COMMUNITY): Payer: Medicare Other

## 2015-05-02 ENCOUNTER — Ambulatory Visit (HOSPITAL_COMMUNITY)
Admission: RE | Admit: 2015-05-02 | Discharge: 2015-05-02 | Disposition: A | Discharge status: Home / Self Care | Payer: Medicare Other | Source: Ambulatory Visit | Attending: Family Medicine | Admitting: Family Medicine

## 2015-05-02 DIAGNOSIS — Z1231 Encounter for screening mammogram for malignant neoplasm of breast: Secondary | ICD-10-CM | POA: Insufficient documentation

## 2015-05-24 DIAGNOSIS — E785 Hyperlipidemia, unspecified: Secondary | ICD-10-CM | POA: Diagnosis not present

## 2015-05-25 LAB — LIPID PANEL
Cholesterol: 150 mg/dL (ref 125–200)
HDL: 57 mg/dL (ref >=46)
LDL CALC: 75 mg/dL (ref <130)
Total CHOL/HDL Ratio: 2.6 Ratio (ref <=5.0)
Triglycerides: 92 mg/dL (ref <150)
VLDL: 18 mg/dL (ref <30)

## 2015-05-26 ENCOUNTER — Other Ambulatory Visit: Payer: Self-pay | Admitting: Family Medicine

## 2015-05-28 ENCOUNTER — Ambulatory Visit (INDEPENDENT_AMBULATORY_CARE_PROVIDER_SITE_OTHER): Payer: Medicare Other | Admitting: Family Medicine

## 2015-05-28 ENCOUNTER — Encounter: Payer: Self-pay | Admitting: Family Medicine

## 2015-05-28 VITALS — BP 128/74 | HR 60 | Resp 16 | Ht 60.0 in | Wt 145.0 lb

## 2015-05-28 DIAGNOSIS — M069 Rheumatoid arthritis, unspecified: Secondary | ICD-10-CM

## 2015-05-28 DIAGNOSIS — M25561 Pain in right knee: Secondary | ICD-10-CM | POA: Insufficient documentation

## 2015-05-28 DIAGNOSIS — M19012 Primary osteoarthritis, left shoulder: Secondary | ICD-10-CM

## 2015-05-28 DIAGNOSIS — I10 Essential (primary) hypertension: Secondary | ICD-10-CM

## 2015-05-28 DIAGNOSIS — E785 Hyperlipidemia, unspecified: Secondary | ICD-10-CM | POA: Diagnosis not present

## 2015-05-28 DIAGNOSIS — Z1211 Encounter for screening for malignant neoplasm of colon: Secondary | ICD-10-CM

## 2015-05-28 DIAGNOSIS — R7303 Prediabetes: Secondary | ICD-10-CM

## 2015-05-28 DIAGNOSIS — M85411 Solitary bone cyst, right shoulder: Secondary | ICD-10-CM

## 2015-05-28 DIAGNOSIS — M19011 Primary osteoarthritis, right shoulder: Secondary | ICD-10-CM | POA: Insufficient documentation

## 2015-05-28 DIAGNOSIS — Z111 Encounter for screening for respiratory tuberculosis: Secondary | ICD-10-CM

## 2015-05-28 LAB — HEMOCCULT GUIAC POC 1CARD (OFFICE): FECAL OCCULT BLD: NEGATIVE

## 2015-05-28 MED ORDER — POTASSIUM CHLORIDE CRYS ER 20 MEQ PO TBCR: 20.0000 meq | EXTENDED_RELEASE_TABLET | Freq: Every day | ORAL | Status: DC

## 2015-05-28 MED ORDER — ROSUVASTATIN CALCIUM 5 MG PO TABS: ORAL_TABLET | ORAL | Status: DC

## 2015-05-28 NOTE — Assessment & Plan Note (Signed)
Controlled, no change in medication  

## 2015-05-28 NOTE — Progress Notes (Signed)
Subjective:    Patient ID: Stacy Meyers, female    DOB: 1932-10-27, 82 y.o.   MRN: [id number]  HPI   Stacy Meyers     MRN: [id number]      DOB: Oct 18, 1932   HPI Stacy Meyers is here for follow up and re-evaluation of chronic medical conditions, medication management and review of any available recent lab and radiology data.  Preventive health is updated, specifically  Cancer screening and Immunization.   Questions or concerns regarding consultations or procedures which the PT has had in the interim are  addressed. The PT denies any adverse reactions to current medications since the last visit.  C/o weakness in right leg x 4 month, no falls, and increased pain and reduced mobility of right knee , also both shoulders, needs help with dressing and grooming herself due to marked limitation in mobility of the shoulders, and notes cyst "popped up" on her right shoulder in past several days Needs TB skin test placed ROS Denies recent fever or chills. Denies sinus pressure, nasal congestion, ear pain or sore throat. Denies chest congestion, productive cough or wheezing. Denies chest pains, palpitations and leg swelling Denies abdominal pain, nausea, vomiting,diarrhea or constipation.   Denies dysuria, frequency, hesitancy or incontinence.  Denies headaches, seizures, numbness, or tingling. Denies depression, anxiety or insomnia. Denies skin break down or rash.   PE  BP 128/74 mmHg  Pulse 60  Resp 16  Ht 5' (1.524 m)  Wt 145 lb (65.772 kg)  BMI 28.32 kg/m2  SpO2 98%  Patient alert and oriented and in no cardiopulmonary distress.  HEENT: No facial asymmetry, EOMI,   oropharynx pink and moist.  Neck decreased ROM no JVD, no mass.  Chest: Clear to auscultation bilaterally.  CVS: S1, S2 no murmurs, no S3.Regular rate.  ABD: Soft non tender.   Ext: No edema  [id number] ROM spine, markedly reduced ROM shoulders, deformity of right knee, cyst in posterior fossa right knee, valgus  deformity of right knee with abnormal gait Cyst on right shoulder, max dia approx 3cm , non tender  Skin: Intact, no ulcerations or rash noted.  Psych: Good eye contact, normal affect. Memory intact not anxious or depressed appearing.  CNS: CN 2-12 intact, power,  normal throughout.no focal deficits noted.   Assessment & Plan   Solitary bone cyst of right shoulder Painless cyst on right shoulder x 4 to 6 weeks, refer to ortho for eval and management  Right knee pain Deformity and stiffness of right knee with Bakers cyst, limitation in mobility, with unsteady gait worsening in past 4 to 6 weeks, refer to ortho for eval and management Fall precautions discussed, uses a cane at times, esp when outside of her home  Osteoarthritis of both shoulders Severe limitation in mobility of both shoulders, impairing ability to function independently, unable to fully extend shoulders or raise them above her head, refer to OT/PT twice weekly for 6 weeks  Essential hypertension, benign Controlled, no change in medication   Hyperlipidemia LDL goal <100 Hyperlipidemia:Low fat diet discussed and encouraged.   Lipid Panel  Lab Results  Component Value Date   CHOL 150 05/24/2015   HDL 57 05/24/2015   LDLCALC 75 05/24/2015   TRIG 92 05/24/2015   CHOLHDL 2.6 05/24/2015  Controlled, no change in medication       Rheumatoid arthritis Significant joint deformity and limitation in mobility, treated by rheumatology  Prediabetes Patient educated about the importance of limiting  Carbohydrate intake ,  the need to commit to daily physical activity for a minimum of 30 minutes , and to commit weight loss. The fact that changes in all these areas will reduce or eliminate all together the development of diabetes is stressed.  Updated lab needed at/ before next visit.   Diabetic Labs Latest Ref Rng 05/24/2015 01/24/2015 03/30/2014 12/04/2013 07/31/2013  HbA1c <5.7 % - 6.6(H) 6.6(H) 6.3(H) 6.5(H)  Chol  125 - 200 mg/dL 150 125 120 - 127  HDL >=46 mg/dL 57 52 65 - 59  Calc LDL <130 mg/dL 75 51 38 - 53  Triglycerides <150 mg/dL 92 111 84 - 77  Creatinine 0.60 - 0.88 mg/dL - 1.08(H) - 1.02 1.03   BP/Weight 05/28/2015 02/04/2015 09/03/2014 05/14/2014 04/03/2014 12/04/2013 [id number]  Systolic BP 0000000 [id number] - 123456 [id number]  Diastolic BP 74 74 70 - 70 70 70  Wt. (Lbs) 145 143 146.04 147 147.4 148 148.12  BMI 28.32 27.93 28.52 28.71 28.79 28.9 29.43   No flowsheet data found.          Review of Systems     Objective:   Physical Exam        Assessment & Plan:

## 2015-05-28 NOTE — Assessment & Plan Note (Signed)
Deformity and stiffness of right knee with Bakers cyst, limitation in mobility, with unsteady gait worsening in past 4 to 6 weeks, refer to ortho for eval and management Fall precautions discussed, uses a cane at times, esp when outside of her home

## 2015-05-28 NOTE — Assessment & Plan Note (Signed)
Patient educated about the importance of limiting  Carbohydrate intake , the need to commit to daily physical activity for a minimum of 30 minutes , and to commit weight loss. The fact that changes in all these areas will reduce or eliminate all together the development of diabetes is stressed.  Updated lab needed at/ before next visit.   Diabetic Labs Latest Ref Rng 05/24/2015 01/24/2015 03/30/2014 12/04/2013 07/31/2013  HbA1c <5.7 % - 6.6(H) 6.6(H) 6.3(H) 6.5(H)  Chol 125 - 200 mg/dL 150 125 120 - 127  HDL >=46 mg/dL 57 52 65 - 59  Calc LDL <130 mg/dL 75 51 38 - 53  Triglycerides <150 mg/dL 92 111 84 - 77  Creatinine 0.60 - 0.88 mg/dL - 1.08(H) - 1.02 1.03   BP/Weight 05/28/2015 02/04/2015 09/03/2014 05/14/2014 04/03/2014 12/04/2013 [id number]  Systolic BP 0000000 [id number] A999333 - 123456 [id number]  Diastolic BP 74 74 70 - 70 70 70  Wt. (Lbs) 145 143 146.04 147 147.4 148 148.12  BMI 28.32 27.93 28.52 28.71 28.79 28.9 29.43   No flowsheet data found.

## 2015-05-28 NOTE — Assessment & Plan Note (Signed)
Painless cyst on right shoulder x 4 to 6 weeks, refer to ortho for eval and management

## 2015-05-28 NOTE — Assessment & Plan Note (Signed)
Severe limitation in mobility of both shoulders, impairing ability to function independently, unable to fully extend shoulders or raise them above her head, refer to OT/PT twice weekly for 6 weeks

## 2015-05-28 NOTE — Progress Notes (Signed)
   Subjective:    Patient ID: Stacy Meyers, female    DOB: 02-Mar-1932, 82 y.o.   MRN: [id number]  HPI    Review of Systems     Objective:   Physical Exam  Rectal: no mass, heme negative stool      Assessment & Plan:

## 2015-05-28 NOTE — Assessment & Plan Note (Signed)
Significant joint deformity and limitation in mobility, treated by rheumatology

## 2015-05-28 NOTE — Assessment & Plan Note (Signed)
Hyperlipidemia:Low fat diet discussed and encouraged.   Lipid Panel  Lab Results  Component Value Date   CHOL 150 05/24/2015   HDL 57 05/24/2015   LDLCALC 75 05/24/2015   TRIG 92 05/24/2015   CHOLHDL 2.6 05/24/2015  Controlled, no change in medication

## 2015-05-28 NOTE — Patient Instructions (Addendum)
Annual wellness in 4.5 month, call if you need me sooner  TB test placed today, return on Thursday for reading Rectal exam shows no blood in stool  You are referred to Dr Luna Glasgow to evaluate cyst on right shoulder and also your right knee   Use cane so you do not fall  Excellent cholesterol.  You are referred to OT /PT for help with reduced shoulder mobility , twice weekly for 6 weeks  Pls have non fasting lab HBA1C drawn in July when you get labs for your rheumatologist  Thank you  for choosing [facility]. We consider it a privelige to serve you.  Delivering excellent health care in a caring and  compassionate way is our goal.  Partnering with you,  so that together we can achieve this goal is our strategy.     Fall Prevention in the Home  Falls can cause injuries. They can happen to people of all ages. There are many things you can do to make your home safe and to help prevent falls.  WHAT CAN I DO ON THE OUTSIDE OF MY HOME?  Regularly fix the edges of walkways and driveways and fix any cracks.  Remove anything that might make you trip as you walk through a door, such as a raised step or threshold.  Trim any bushes or trees on the path to your home.  Use bright outdoor lighting.  Clear any walking paths of anything that might make someone trip, such as rocks or tools.  Regularly check to see if handrails are loose or broken. Make sure that both sides of any steps have handrails.  Any raised decks and porches should have guardrails on the edges.  Have any leaves, snow, or ice cleared regularly.  Use sand or salt on walking paths during winter.  Clean up any spills in your garage right away. This includes oil or grease spills. WHAT CAN I DO IN THE BATHROOM?   Use night lights.  Install grab bars by the toilet and in the tub and shower. Do not use towel bars as grab bars.  Use non-skid mats or decals in the tub or shower.  If you need to sit down in  the shower, use a plastic, non-slip stool.  Keep the floor dry. Clean up any water that spills on the floor as soon as it happens.  Remove soap buildup in the tub or shower regularly.  Attach bath mats securely with double-sided non-slip rug tape.  Do not have throw rugs and other things on the floor that can make you trip. WHAT CAN I DO IN THE BEDROOM?  Use night lights.  Make sure that you have a light by your bed that is easy to reach.  Do not use any sheets or blankets that are too big for your bed. They should not hang down onto the floor.  Have a firm chair that has side arms. You can use this for support while you get dressed.  Do not have throw rugs and other things on the floor that can make you trip. WHAT CAN I DO IN THE KITCHEN?  Clean up any spills right away.  Avoid walking on wet floors.  Keep items that you use a lot in easy-to-reach places.  If you need to reach something above you, use a strong step stool that has a grab bar.  Keep electrical cords out of the way.  Do not use floor polish or wax that makes floors slippery.  If you must use wax, use non-skid floor wax.  Do not have throw rugs and other things on the floor that can make you trip. WHAT CAN I DO WITH MY STAIRS?  Do not leave any items on the stairs.  Make sure that there are handrails on both sides of the stairs and use them. Fix handrails that are broken or loose. Make sure that handrails are as long as the stairways.  Check any carpeting to make sure that it is firmly attached to the stairs. Fix any carpet that is loose or worn.  Avoid having throw rugs at the top or bottom of the stairs. If you do have throw rugs, attach them to the floor with carpet tape.  Make sure that you have a light switch at the top of the stairs and the bottom of the stairs. If you do not have them, ask someone to add them for you. WHAT ELSE CAN I DO TO HELP PREVENT FALLS?  Wear shoes that:  Do not have high  heels.  Have rubber bottoms.  Are comfortable and fit you well.  Are closed at the toe. Do not wear sandals.  If you use a stepladder:  Make sure that it is fully opened. Do not climb a closed stepladder.  Make sure that both sides of the stepladder are locked into place.  Ask someone to hold it for you, if possible.  Clearly mark and make sure that you can see:  Any grab bars or handrails.  First and last steps.  Where the edge of each step is.  Use tools that help you move around (mobility aids) if they are needed. These include:  Canes.  Walkers.  Scooters.  Crutches.  Turn on the lights when you go into a dark area. Replace any light bulbs as soon as they burn out.  Set up your furniture so you have a clear path. Avoid moving your furniture around.  If any of your floors are uneven, fix them.  If there are any pets around you, be aware of where they are.  Review your medicines with your doctor. Some medicines can make you feel dizzy. This can increase your chance of falling. Ask your doctor what other things that you can do to help prevent falls.   This information is not intended to replace advice given to you by your health care provider. Make sure you discuss any questions you have with your health care provider.   Document Released: 10/18/2008 Document Revised: 05/08/2014 Document Reviewed: 01/26/2014 Elsevier Interactive Patient Education [employer].

## 2015-05-30 LAB — TB SKIN TEST
INDURATION: 0 mm
TB SKIN TEST: NEGATIVE

## 2015-06-11 ENCOUNTER — Encounter: Payer: Self-pay | Admitting: Orthopaedic Surgery

## 2015-06-11 ENCOUNTER — Ambulatory Visit (INDEPENDENT_AMBULATORY_CARE_PROVIDER_SITE_OTHER): Payer: Medicare Other

## 2015-06-11 ENCOUNTER — Telehealth: Payer: Self-pay | Admitting: Family Medicine

## 2015-06-11 ENCOUNTER — Ambulatory Visit (INDEPENDENT_AMBULATORY_CARE_PROVIDER_SITE_OTHER): Payer: Medicare Other | Admitting: Orthopaedic Surgery

## 2015-06-11 VITALS — BP 114/67 | HR 65 | Temp 97.5°F | Ht 58.75 in | Wt 142.6 lb

## 2015-06-11 DIAGNOSIS — M05711 Rheumatoid arthritis with rheumatoid factor of right shoulder without organ or systems involvement: Secondary | ICD-10-CM

## 2015-06-11 DIAGNOSIS — M25561 Pain in right knee: Secondary | ICD-10-CM

## 2015-06-11 DIAGNOSIS — J302 Other seasonal allergic rhinitis: Secondary | ICD-10-CM

## 2015-06-11 DIAGNOSIS — M25511 Pain in right shoulder: Secondary | ICD-10-CM | POA: Diagnosis not present

## 2015-06-11 DIAGNOSIS — I10 Essential (primary) hypertension: Secondary | ICD-10-CM

## 2015-06-11 NOTE — Telephone Encounter (Signed)
Spoke with patient and notified her that pcp is unavailable.  Advised her that she try otc Sudafed x 5 days.  She will seek care at urgent care if not feeling any better by Thursday 6/8

## 2015-06-11 NOTE — Telephone Encounter (Signed)
Patient is complaining of coughing a lot, bringing up a lot of white mucos, congestion is all in her head, symptoms about a week, denies fever

## 2015-06-11 NOTE — Telephone Encounter (Signed)
Noted that patient is currently at ortho being seen.  Will contact later in the day

## 2015-06-11 NOTE — Progress Notes (Signed)
Subjective:  My right shoulder hurts and has a cyst    Patient ID: Stacy Meyers, female    DOB: 29-Sep-1932, 82 y.o.   MRN: [id number]  Shoulder Pain  The pain is present in the right shoulder. This is a chronic problem. The current episode started more than 1 year ago. There has been no history of extremity trauma. The problem occurs daily. The problem has been gradually worsening. The quality of the pain is described as aching. The pain is at a severity of 3/10. The pain is mild. The symptoms are aggravated by activity and cold. She has tried acetaminophen, heat, NSAIDS and rest for the symptoms. The treatment provided moderate relief. Family history includes rheumatoid arthritis. Family history does not include gout. There is no history of diabetes or gout.   She has long history of rheumatoid arthritis involving multiple joints including the right shoulder, both hands and right knee.  The right shoulder has developed a large cystic lesion over the [facility] joint area that is tender and bothering her over the last several months.  It is not red, it has not drained.  She has no trauma, no paresthesias.   Review of Systems  HENT: Negative for congestion.   Respiratory: Negative for cough and shortness of breath.   Cardiovascular: Negative for chest pain and leg swelling.  Endocrine: Positive for cold intolerance.  Musculoskeletal: Positive for back pain, joint swelling, arthralgias and gait problem. Negative for gout.  Allergic/Immunologic: Positive for environmental allergies.   Past Medical History  Diagnosis Date  . Essential hypertension, benign   . Mixed hyperlipidemia   . Rheumatoid arthritis(714.0)   . Osteoarthritis   . Bradycardia     Sinus - noted 6/11  . Hypokalemia 12/14/2010  . HTN (hypertension) 12/14/2010  . Hypercalcemia 12/14/2010  . Anemia 12/14/2010  . Cholelithiasis 12/14/2010  . Collagen vascular disease ([place])   . Diastolic dysfunction [id number]    Grade 1 per 2-D  echocardiogram  . Acute cholecystitis 12/16/2010  . ARF (acute renal failure) ([place]) 12/14/2010  . Dysrhythmia     Past Surgical History  Procedure Laterality Date  . Bilateral cataract surgery  2009  . L2-l3 discectomy  2002  . Right shoulder cyst excision  2010  . L3-l4, l4-l5 lumbar decompression  2011  . Cholecystectomy  12/17/2010    Procedure: LAPAROSCOPIC CHOLECYSTECTOMY;  Surgeon: Donato Heinz;  Location: [facility] ORS;  Service: General;  Laterality: N/A;  . Colonoscopy  05/22/2011    Procedure: COLONOSCOPY;  Surgeon: Rogene Houston, MD;  Location: [facility] ENDO SUITE;  Service: Endoscopy;  Laterality: N/A;  1045  . Bunionectomy Right 2012  . Ganglion cyst excision Left 06/02/2012    Procedure: Excision ganglion left dorsal wrist;  Surgeon: Sanjuana Kava, MD;  Location: [facility] ORS;  Service: Orthopedics;  Laterality: Left;  Marland Kitchen Eye surgery Bilateral     cataract  . Spine surgery      twice, approx 2006, 2011    Current Outpatient Prescriptions on File Prior to Visit  Medication Sig Dispense Refill  . acetaminophen (TYLENOL) 500 MG tablet Take 1,000 mg by mouth every 6 (six) hours as needed. Reported on 05/28/2015    . acyclovir (ZOVIRAX) 400 MG tablet TAKE 1 TABLET BY MOUTH TWICE A DAY 180 tablet 2  . alendronate (FOSAMAX) 70 MG tablet TAKE 1 TABLET BY MOUTH EVERY 7 DAYS. TAKE WITH A FULL GLASS OF WATER ON AN EMPTY STOMACH. 12 tablet 1  . amLODipine (NORVASC)  2.5 MG tablet TAKE 1 TABLET BY MOUTH EVERY DAY 90 tablet 1  . calcium-vitamin D (OSCAL 500/200 D-3) 500-200 MG-UNIT per tablet Take 1 tablet by mouth 2 (two) times daily. 180 tablet 3  . diltiazem (CARDIZEM CD) 120 MG 24 hr capsule TAKE ONE CAPSULE BY MOUTH EVERY DAY 90 capsule 1  . folic acid (FOLVITE) 1 MG tablet Take 1 mg by mouth daily.    . methotrexate (RHEUMATREX) 2.5 MG tablet Take 2.5 mg by mouth once a week. Caution:Chemotherapy. Protect from light.    . potassium chloride SA (KLOR-CON M20) 20 MEQ tablet Take 1 tablet (20 mEq  total) by mouth daily. 90 tablet 1  . predniSONE (DELTASONE) 5 MG tablet Take 5 mg by mouth daily.    . rosuvastatin (CRESTOR) 5 MG tablet One tablet every other day 90 tablet 1   No current facility-administered medications on file prior to visit.    Social History   Social History  . Marital Status: Married    Spouse Name: N/A  . Number of Children: 1  . Years of Education: N/A   Occupational History  . Retired     Astronomer   Social History Main Topics  . Smoking status: Never Smoker   . Smokeless tobacco: Never Used     Comment: Second hand smoke   . Alcohol Use: No  . Drug Use: No  . Sexual Activity: Not Currently   Other Topics Concern  . Not on file   Social History Narrative    BP 114/67 mmHg  Pulse 65  Temp(Src) 97.5 F (36.4 C)  Ht 4' 10.75" (1.492 m)  Wt 142 lb 9.6 oz (64.683 kg)  BMI 29.06 kg/m2     Objective:   Physical Exam  Constitutional: She is oriented to person, place, and time. She appears well-developed and well-nourished.  HENT:  Head: Normocephalic and atraumatic.  Eyes: Conjunctivae and EOM are normal. Pupils are equal, round, and reactive to light.  Neck: Normal range of motion. Neck supple.  Cardiovascular: Normal rate, regular rhythm and intact distal pulses.   Pulmonary/Chest: Effort normal.  Abdominal: Soft.  Musculoskeletal: She exhibits tenderness (She has rheumatoid deformities of the hands bilaterally, ulnar deviation.  Right shoulder limited motion, large cyst over the [facility] joint size of very large  bing cherry.  Not red.).       Right shoulder: She exhibits decreased range of motion, tenderness and crepitus.       Arms: Neurological: She is alert and oriented to person, place, and time. She displays normal reflexes. No cranial nerve deficit. She exhibits normal muscle tone. Coordination normal.  Skin: Skin is warm and dry.  Psychiatric: She has a normal mood and affect. Her behavior is normal. Judgment and thought content  normal.    X-rays were done of the right shoulder, reported separately.  PROCEDURE NOTE  After appropriate prep and patient permission, I aspirated by sterile technique the large cyst of the right shoulder over the [facility] joint of over 8 cc of clear gelatinous material tolerated well.     Assessment & Plan:   Encounter Diagnoses  Name Primary?  . Rheumatoid arthritis involving right shoulder with positive rheumatoid factor ([place])   . Right shoulder pain Yes  . Right knee pain   . Essential hypertension, benign   . Seasonal allergies    Return in one month.  Call if any problem.  Electronically Signed Sanjuana Kava, MD 6/6/201710:45 AM

## 2015-06-16 ENCOUNTER — Telehealth: Payer: Self-pay | Admitting: Family Medicine

## 2015-06-16 MED ORDER — MONTELUKAST SODIUM 10 MG PO TABS: 10.0000 mg | ORAL_TABLET | Freq: Every day | ORAL | Status: DC

## 2015-06-16 MED ORDER — PROMETHAZINE-DM 6.25-15 MG/5ML PO SYRP: ORAL_SOLUTION | ORAL | Status: DC

## 2015-06-16 MED ORDER — PENICILLIN V POTASSIUM 500 MG PO TABS: 500.0000 mg | ORAL_TABLET | Freq: Three times a day (TID) | ORAL | Status: DC

## 2015-06-16 NOTE — Telephone Encounter (Signed)
2 week h/o head congestion, did have fever now resolved, weak, coughing all night, pt aware meds have  Been sent in, penicillin, singulair and  Phenergan dm, she will  Keep appt in am also

## 2015-06-17 ENCOUNTER — Ambulatory Visit (INDEPENDENT_AMBULATORY_CARE_PROVIDER_SITE_OTHER): Payer: Medicare Other | Admitting: Family Medicine

## 2015-06-17 ENCOUNTER — Ambulatory Visit (HOSPITAL_COMMUNITY)
Admission: RE | Admit: 2015-06-17 | Discharge: 2015-06-17 | Disposition: A | Discharge status: Home / Self Care | Payer: Medicare Other | Source: Ambulatory Visit | Attending: Family Medicine | Admitting: Family Medicine

## 2015-06-17 ENCOUNTER — Encounter: Payer: Self-pay | Admitting: Family Medicine

## 2015-06-17 VITALS — BP 120/74 | HR 91 | Temp 98.3°F | Resp 16 | Ht <= 58 in | Wt 143.4 lb

## 2015-06-17 DIAGNOSIS — J209 Acute bronchitis, unspecified: Secondary | ICD-10-CM

## 2015-06-17 DIAGNOSIS — R05 Cough: Secondary | ICD-10-CM | POA: Diagnosis not present

## 2015-06-17 DIAGNOSIS — I10 Essential (primary) hypertension: Secondary | ICD-10-CM | POA: Diagnosis not present

## 2015-06-17 DIAGNOSIS — M069 Rheumatoid arthritis, unspecified: Secondary | ICD-10-CM | POA: Diagnosis not present

## 2015-06-17 DIAGNOSIS — J0101 Acute recurrent maxillary sinusitis: Secondary | ICD-10-CM

## 2015-06-17 DIAGNOSIS — J0100 Acute maxillary sinusitis, unspecified: Secondary | ICD-10-CM | POA: Insufficient documentation

## 2015-06-17 MED ORDER — IPRATROPIUM BROMIDE 0.02 % IN SOLN
0.5000 mg | Freq: Once | RESPIRATORY_TRACT | Status: AC
  Administered 2015-06-17: 0.5 mg via RESPIRATORY_TRACT

## 2015-06-17 MED ORDER — METHYLPREDNISOLONE ACETATE 80 MG/ML IJ SUSP
80.0000 mg | Freq: Once | INTRAMUSCULAR | Status: AC
  Administered 2015-06-17: 80 mg via INTRAMUSCULAR

## 2015-06-17 MED ORDER — BENZONATATE 100 MG PO CAPS: 100.0000 mg | ORAL_CAPSULE | Freq: Two times a day (BID) | ORAL | Status: DC | PRN

## 2015-06-17 MED ORDER — ALBUTEROL SULFATE (2.5 MG/3ML) 0.083% IN NEBU
2.5000 mg | INHALATION_SOLUTION | Freq: Once | RESPIRATORY_TRACT | Status: AC
  Administered 2015-06-17: 2.5 mg via RESPIRATORY_TRACT

## 2015-06-17 MED ORDER — PREDNISONE 5 MG PO TABS: ORAL_TABLET | ORAL | Status: DC

## 2015-06-17 MED ORDER — CEFTRIAXONE SODIUM 500 MG IJ SOLR
500.0000 mg | Freq: Once | INTRAMUSCULAR | Status: AC
  Administered 2015-06-17: 500 mg via INTRAMUSCULAR

## 2015-06-17 MED ORDER — PROMETHAZINE-CODEINE 6.25-10 MG/5ML PO SYRP: ORAL_SOLUTION | ORAL | Status: DC

## 2015-06-17 NOTE — Progress Notes (Signed)
   Subjective:    Patient ID: Stacy Meyers, female    DOB: 1932/10/07, 82 y.o.   MRN: [id number]  HPI 2 week h/o worsening head and chest congestion, associated with fever and chills intermittently. Nasal drainage has thickened , and is yellowish green, and at times bloody. Sputum is thick and yellow. C/o bilateral ear pressure, denies hearing loss and sore throat. Increasing fatigue , poor appetitie and sleep disturbed by cough. No improvement with OTC medication. Has been out of [place] for 3 weeks due to illness    Review of Systems See HPI  Denies chest pains, palpitations and leg swelling Denies abdominal pain, nausea, vomiting,diarrhea or constipation.   Denies dysuria, frequency, hesitancy or incontinence. Denies joint pain, swelling and limitation in mobility. Denies headaches, seizures, numbness, or tingling. Denies depression, anxiety or insomnia. Denies skin break down or rash.        Objective:   Physical Exam BP 120/74 mmHg  Pulse 91  Temp(Src) 98.3 F (36.8 C)  Resp 16  Ht 4\' 10"  (1.473 m)  Wt 143 lb 6.4 oz (65.046 kg)  BMI 29.98 kg/m2  SpO2 97% Patient alert and oriented and in no cardiopulmonary distress.  HEENT: No facial asymmetry, EOMI,   oropharynx pink and moist.  Neck supple no JVD, no mass. Nasal congestion, erythema and edema , mild maxillary tenderness Chest: decreased air entry, bilateral wheeze, few crackles in bases  CVS: S1, S2 no murmurs, no S3.Regular rate.  ABD: Soft non tender.   Ext: No edema  MS: Adequate ROM spine, shoulders, hips and knees.  Skin: Intact, no ulcerations or rash noted.  Psych: Good eye contact, normal affect. Memory intact not anxious or depressed appearing.  CNS: CN 2-12 intact, power,  normal throughout.no focal deficits noted.        Assessment & Plan:  Acute bronchitis Neb treatment, Rocephin and depo medrol in office, continue penicillinn, tessalon perles added and pred dose pack and cough  suppressant  Acute maxillary sinusitis Antibiotic prescribed  Essential hypertension, benign Controlled, no change in medication   Rheumatoid arthritis Reports flare of pain with sudafed , which she stopped taking

## 2015-06-17 NOTE — Patient Instructions (Signed)
F/u as before, call if you need me sooner  You are treated for sinusitis and acute bronchitis.  CXR today  PLEASE take entire course of penicillin prescribed  New medication sent are prednisone, once finished prescribed course resume once daily prednisone Tessalon perles to help to clear chest, and cough syrup, use only at night, has sleepy side effect, careful not to fall

## 2015-06-17 NOTE — Assessment & Plan Note (Signed)
Reports flare of pain with sudafed , which she stopped taking

## 2015-06-17 NOTE — Assessment & Plan Note (Signed)
Controlled, no change in medication  

## 2015-06-17 NOTE — Assessment & Plan Note (Signed)
Antibiotic prescribed 

## 2015-06-17 NOTE — Assessment & Plan Note (Addendum)
Neb treatment, Rocephin and depo medrol in office, continue penicillinn, tessalon perles added and pred dose pack and cough suppressant

## 2015-06-20 ENCOUNTER — Encounter: Payer: Self-pay | Admitting: Orthopaedic Surgery

## 2015-06-20 ENCOUNTER — Ambulatory Visit (INDEPENDENT_AMBULATORY_CARE_PROVIDER_SITE_OTHER): Payer: Medicare Other | Admitting: Orthopaedic Surgery

## 2015-06-20 VITALS — BP 145/77 | HR 65 | Temp 98.1°F | Ht <= 58 in | Wt 140.0 lb

## 2015-06-20 DIAGNOSIS — I10 Essential (primary) hypertension: Secondary | ICD-10-CM

## 2015-06-20 DIAGNOSIS — M05711 Rheumatoid arthritis with rheumatoid factor of right shoulder without organ or systems involvement: Secondary | ICD-10-CM | POA: Diagnosis not present

## 2015-06-20 DIAGNOSIS — J302 Other seasonal allergic rhinitis: Secondary | ICD-10-CM

## 2015-06-20 DIAGNOSIS — M25511 Pain in right shoulder: Secondary | ICD-10-CM

## 2015-06-20 NOTE — Progress Notes (Signed)
CC:  That cyst has more fluid in it  She has large cystic structure off the superior right shoulder.  I aspirated it of considerable fluid last visit on 06-11-15.  It has begun to get more fluid in it and she would like to have it aspirated.  It is not red.  She has a cyst size of a small grape today.  Encounter Diagnoses  Name Primary?  . Rheumatoid arthritis involving right shoulder with positive rheumatoid factor ([place]) Yes  . Right shoulder pain   . Essential hypertension, benign   . Seasonal allergies    I aspirated the cyst of about 4 cc of gelatinous fluid by sterile technique tolerated well.  Return in one month.  Call if any problem.  Electronically Signed Sanjuana Kava, MD 6/15/20179:37 AM

## 2015-06-27 ENCOUNTER — Other Ambulatory Visit: Payer: Self-pay | Admitting: Family Medicine

## 2015-06-28 ENCOUNTER — Other Ambulatory Visit: Payer: Self-pay

## 2015-06-28 DIAGNOSIS — E785 Hyperlipidemia, unspecified: Secondary | ICD-10-CM

## 2015-06-28 MED ORDER — ROSUVASTATIN CALCIUM 5 MG PO TABS: ORAL_TABLET | ORAL | Status: DC

## 2015-07-10 ENCOUNTER — Ambulatory Visit: Payer: Medicare Other | Admitting: Orthopaedic Surgery

## 2015-07-22 DIAGNOSIS — Z79899 Other long term (current) drug therapy: Secondary | ICD-10-CM | POA: Diagnosis not present

## 2015-07-23 ENCOUNTER — Ambulatory Visit: Payer: Medicare Other | Admitting: Orthopaedic Surgery

## 2015-07-25 DIAGNOSIS — Z09 Encounter for follow-up examination after completed treatment for conditions other than malignant neoplasm: Secondary | ICD-10-CM | POA: Diagnosis not present

## 2015-07-25 DIAGNOSIS — M79642 Pain in left hand: Secondary | ICD-10-CM | POA: Diagnosis not present

## 2015-07-25 DIAGNOSIS — M79641 Pain in right hand: Secondary | ICD-10-CM | POA: Diagnosis not present

## 2015-07-25 DIAGNOSIS — M79671 Pain in right foot: Secondary | ICD-10-CM | POA: Diagnosis not present

## 2015-07-25 DIAGNOSIS — M0579 Rheumatoid arthritis with rheumatoid factor of multiple sites without organ or systems involvement: Secondary | ICD-10-CM | POA: Diagnosis not present

## 2015-07-25 DIAGNOSIS — R768 Other specified abnormal immunological findings in serum: Secondary | ICD-10-CM | POA: Diagnosis not present

## 2015-07-25 DIAGNOSIS — M79672 Pain in left foot: Secondary | ICD-10-CM | POA: Diagnosis not present

## 2015-08-10 ENCOUNTER — Other Ambulatory Visit: Payer: Self-pay | Admitting: Family Medicine

## 2015-08-16 ENCOUNTER — Other Ambulatory Visit: Payer: Self-pay | Admitting: Family Medicine

## 2015-09-20 DIAGNOSIS — Z23 Encounter for immunization: Secondary | ICD-10-CM | POA: Diagnosis not present

## 2015-10-02 ENCOUNTER — Other Ambulatory Visit: Payer: Self-pay | Admitting: Family Medicine

## 2015-10-02 DIAGNOSIS — I10 Essential (primary) hypertension: Secondary | ICD-10-CM | POA: Diagnosis not present

## 2015-10-02 DIAGNOSIS — N182 Chronic kidney disease, stage 2 (mild): Secondary | ICD-10-CM | POA: Diagnosis not present

## 2015-10-03 ENCOUNTER — Other Ambulatory Visit: Payer: Self-pay

## 2015-10-03 ENCOUNTER — Other Ambulatory Visit: Payer: Self-pay | Admitting: Family Medicine

## 2015-10-03 MED ORDER — AMLODIPINE BESYLATE 2.5 MG PO TABS: 2.5000 mg | ORAL_TABLET | Freq: Every day | ORAL | 1 refills | Status: DC

## 2015-10-03 MED ORDER — DILTIAZEM HCL ER COATED BEADS 120 MG PO CP24: 120.0000 mg | ORAL_CAPSULE | Freq: Every day | ORAL | 1 refills | Status: DC

## 2015-11-07 ENCOUNTER — Telehealth: Payer: Self-pay | Admitting: Radiology

## 2015-11-07 DIAGNOSIS — Z79631 Long term (current) use of antimetabolite agent: Secondary | ICD-10-CM

## 2015-11-07 DIAGNOSIS — Z79899 Other long term (current) drug therapy: Secondary | ICD-10-CM

## 2015-11-07 NOTE — Telephone Encounter (Signed)
Last visit 07/25/15 Next visit Labs 07/2015 past due  Can not refill w/o labs Will call pt to advise

## 2015-11-07 NOTE — Telephone Encounter (Signed)
Refill request received via fax for Methotrexate 4/wk Oral

## 2015-11-08 NOTE — Telephone Encounter (Signed)
Called patient to advise labs due

## 2015-11-09 ENCOUNTER — Other Ambulatory Visit: Payer: Self-pay | Admitting: Rheumatology

## 2015-11-09 DIAGNOSIS — R7309 Other abnormal glucose: Secondary | ICD-10-CM | POA: Diagnosis not present

## 2015-11-09 DIAGNOSIS — R7303 Prediabetes: Secondary | ICD-10-CM | POA: Diagnosis not present

## 2015-11-09 DIAGNOSIS — Z79899 Other long term (current) drug therapy: Secondary | ICD-10-CM | POA: Diagnosis not present

## 2015-11-09 LAB — COMPLETE METABOLIC PANEL WITH GFR
ALT: 8 U/L (ref 6–29)
AST: 13 U/L (ref 10–35)
Albumin: 4.1 g/dL (ref 3.6–5.1)
Alkaline Phosphatase: 55 U/L (ref 33–130)
BUN: 21 mg/dL (ref 7–25)
CALCIUM: 9.8 mg/dL (ref 8.6–10.4)
CHLORIDE: 106 mmol/L (ref 98–110)
CO2: 27 mmol/L (ref 20–31)
Creat: 0.97 mg/dL — ABNORMAL HIGH (ref 0.60–0.88)
GFR, Est African American: 63 mL/min (ref >=60)
GFR, Est Non African American: 55 mL/min — ABNORMAL LOW (ref >=60)
Glucose, Bld: 92 mg/dL (ref 65–99)
POTASSIUM: 3.8 mmol/L (ref 3.5–5.3)
SODIUM: 140 mmol/L (ref 135–146)
Total Bilirubin: 0.7 mg/dL (ref 0.2–1.2)
Total Protein: 6.5 g/dL (ref 6.1–8.1)

## 2015-11-09 LAB — CBC WITH DIFFERENTIAL/PLATELET
BASOS PCT: 1 %
Basophils Absolute: 56 cells/uL (ref 0–200)
EOS ABS: 112 {cells}/uL (ref 15–500)
Eosinophils Relative: 2 %
HEMATOCRIT: 39.4 % (ref 35.0–45.0)
HEMOGLOBIN: 13.3 g/dL (ref 11.7–15.5)
LYMPHS ABS: 2184 {cells}/uL (ref 850–3900)
Lymphocytes Relative: 39 %
MCH: 31.5 pg (ref 27.0–33.0)
MCHC: 33.8 g/dL (ref 32.0–36.0)
MCV: 93.4 fL (ref 80.0–100.0)
MONO ABS: 336 {cells}/uL (ref 200–950)
MPV: 8.7 fL (ref 7.5–12.5)
Monocytes Relative: 6 %
NEUTROS PCT: 52 %
Neutro Abs: 2912 cells/uL (ref 1500–7800)
Platelets: 253 10*3/uL (ref 140–400)
RBC: 4.22 MIL/uL (ref 3.80–5.10)
RDW: 13.5 % (ref 11.0–15.0)
WBC: 5.6 10*3/uL (ref 3.8–10.8)

## 2015-11-10 LAB — HEMOGLOBIN A1C
HEMOGLOBIN A1C: 6.4 % — AB (ref <5.7)
MEAN PLASMA GLUCOSE: 137 mg/dL

## 2015-11-11 ENCOUNTER — Telehealth: Payer: Self-pay | Admitting: Radiology

## 2015-11-11 MED ORDER — METHOTREXATE 2.5 MG PO TABS: 10.0000 mg | ORAL_TABLET | ORAL | 0 refills | Status: DC

## 2015-11-11 NOTE — Telephone Encounter (Signed)
Labs Friday WNL Ok to refill per Dr Estanislado Pandy

## 2015-11-11 NOTE — Telephone Encounter (Signed)
Call pt/ labs c/w previous

## 2015-11-11 NOTE — Telephone Encounter (Signed)
I have called patient to advise labs are normal  

## 2015-11-11 NOTE — Addendum Note (Signed)
Addended byCandice Camp on: 11/11/2015 10:42 AM   Modules accepted: Orders

## 2015-11-25 ENCOUNTER — Encounter: Payer: Medicare Other | Admitting: Family Medicine

## 2015-11-26 ENCOUNTER — Encounter: Payer: Self-pay | Admitting: Family Medicine

## 2015-11-26 ENCOUNTER — Ambulatory Visit (INDEPENDENT_AMBULATORY_CARE_PROVIDER_SITE_OTHER): Payer: Medicare Other | Admitting: Family Medicine

## 2015-11-26 DIAGNOSIS — J209 Acute bronchitis, unspecified: Secondary | ICD-10-CM

## 2015-11-26 MED ORDER — PREDNISONE 20 MG PO TABS: 20.0000 mg | ORAL_TABLET | Freq: Two times a day (BID) | ORAL | 0 refills | Status: AC

## 2015-11-26 MED ORDER — AZITHROMYCIN 250 MG PO TABS: ORAL_TABLET | ORAL | 0 refills | Status: DC

## 2015-11-26 MED ORDER — BENZONATATE 100 MG PO CAPS: 100.0000 mg | ORAL_CAPSULE | Freq: Two times a day (BID) | ORAL | 0 refills | Status: DC | PRN

## 2015-11-26 NOTE — Patient Instructions (Signed)
Drink plenty of water Take the z pak as instructed Take the prednisone twice a day for 5 days Use the tessalon as needed for cough Use you inhaler as needed for wheezing Call if not better by Monday

## 2015-11-26 NOTE — Progress Notes (Signed)
Chief Complaint  Patient presents with  . Cough    x 4 days   Here for cough Wheeze and sputum production No fever or chills Chest wall is sore from coughing Sinus congestion and PND No sore throat or ear pain No nausea or vomiting Has been using her inhaler No exposure pneumonia or illness   Patient Active Problem List   Diagnosis Date Noted  . Acute bronchitis 06/17/2015  . Acute maxillary sinusitis 06/17/2015  . Solitary bone cyst of right shoulder 05/28/2015  . Right knee pain 05/28/2015  . Osteoarthritis of both shoulders 05/28/2015  . Overweight (BMI 25.0-29.9) 04/28/2014  . Nocturia 12/04/2013  . Ganglion cyst of wrist 05/11/2012  . Systolic murmur [id number]  . Diastolic dysfunction [id number]  . Seasonal allergies 08/28/2010  . Prediabetes 07/25/2009  . BACK PAIN WITH RADICULOPATHY 04/22/2009  . Herpes simplex virus (HSV) infection 09/16/2008  . Rheumatoid arthritis ([place]) 09/16/2008  . Hyperlipidemia LDL goal <100 05/23/2008  . Essential hypertension, benign 05/23/2008    Outpatient Encounter Prescriptions as of 11/26/2015  Medication Sig  . acetaminophen (TYLENOL) 500 MG tablet Take 1,000 mg by mouth every 6 (six) hours as needed. Reported on 05/28/2015  . acyclovir (ZOVIRAX) 400 MG tablet TAKE 1 TABLET BY MOUTH TWICE A DAY  . alendronate (FOSAMAX) 70 MG tablet TAKE 1 TABLET BY MOUTH EVERY 7 DAYS. TAKE WITH A FULL GLASS OF WATER ON AN EMPTY STOMACH.  Marland Kitchen amLODipine (NORVASC) 2.5 MG tablet Take 1 tablet (2.5 mg total) by mouth daily.  . benzonatate (TESSALON) 100 MG capsule Take 1 capsule (100 mg total) by mouth 2 (two) times daily as needed for cough.  . diltiazem (CARDIZEM CD) 120 MG 24 hr capsule TAKE ONE CAPSULE BY MOUTH EVERY DAY  . folic acid (FOLVITE) 1 MG tablet Take 1 mg by mouth daily.  . methotrexate (RHEUMATREX) 2.5 MG tablet Take 4 tablets (10 mg total) by mouth once a week. Caution:Chemotherapy. Protect from light.  . montelukast (SINGULAIR) 10  MG tablet TAKE 1 TABLET (10 MG TOTAL) BY MOUTH AT BEDTIME.  Marland Kitchen potassium chloride SA (KLOR-CON M20) 20 MEQ tablet Take 1 tablet (20 mEq total) by mouth daily.  . promethazine-codeine (PHENERGAN WITH CODEINE) 6.25-10 MG/5ML syrup One teaspoon at bedtime, as needed, for excessive cough  . promethazine-dextromethorphan (PROMETHAZINE-DM) 6.25-15 MG/5ML syrup One teaspoon at bedtime as needed, for excessive cough  . rosuvastatin (CRESTOR) 5 MG tablet One tablet every other day  . azithromycin (ZITHROMAX Z-PAK) 250 MG tablet tad  . calcium-vitamin D (OSCAL 500/200 D-3) 500-200 MG-UNIT per tablet Take 1 tablet by mouth 2 (two) times daily.  . predniSONE (DELTASONE) 20 MG tablet Take 1 tablet (20 mg total) by mouth 2 (two) times daily with a meal.   No facility-administered encounter medications on file as of 11/26/2015.     Allergies  Allergen Reactions  . Aspirin Other (See Comments)    REACTION: burns stomach (uncoated)  . Sudafed [Pseudoephedrine Hcl] Other (See Comments)    Increased joint pain    Review of Systems  Constitutional: Negative for chills, diaphoresis and fever.  HENT: Positive for congestion, postnasal drip, rhinorrhea and sinus pressure.   Eyes: Negative for photophobia and redness.  Respiratory: Positive for cough, shortness of breath and wheezing.   Cardiovascular: Positive for chest pain. Negative for palpitations and leg swelling.  Gastrointestinal: Negative for constipation and diarrhea.  Genitourinary: Positive for urgency. Negative for difficulty urinating.  Musculoskeletal: Positive for arthralgias.  Neurological: Negative  for dizziness and headaches.  Psychiatric/Behavioral: Negative for dysphoric mood. The patient is not nervous/anxious.     BP 138/64 (BP Location: Right Arm, Patient Position: Sitting, Cuff Size: Normal)   Pulse 60   Temp 98.6 F (37 C) (Oral)   Resp 16   Ht 4\' 10"  (1.473 m)   Wt 140 lb 0.6 oz (63.5 kg)   SpO2 93%   BMI 29.27 kg/m    Physical Exam  Constitutional: She is oriented to person, place, and time. She appears well-developed and well-nourished. No distress.  HENT:  Head: Normocephalic and atraumatic.  Right Ear: External ear normal.  Left Ear: External ear normal.  Mouth/Throat: Oropharynx is clear and moist.  Bone torus lower gums  Eyes: Conjunctivae are normal. Pupils are equal, round, and reactive to light.  Neck: Normal range of motion.  Cardiovascular: Normal rate and regular rhythm.   Murmur heard. Pulmonary/Chest: Effort normal. She has wheezes.  Abdominal: Soft. Bowel sounds are normal. She exhibits no distension.  Musculoskeletal: Normal range of motion.  Lymphadenopathy:    She has no cervical adenopathy.  Neurological: She is alert and oriented to person, place, and time.  Psychiatric: She has a normal mood and affect. Her behavior is normal. Thought content normal.    ASSESSMENT/PLAN:  1. Acute bronchitis, unspecified organism \ - benzonatate (TESSALON) 100 MG capsule; Take 1 capsule (100 mg total) by mouth 2 (two) times daily as needed for cough.  Dispense: 20 capsule; Refill: 0   Patient Instructions  Drink plenty of water Take the z pak as instructed Take the prednisone twice a day for 5 days Use the tessalon as needed for cough Use you inhaler as needed for wheezing Call if not better by Monday   Raylene Everts, MD

## 2015-12-05 ENCOUNTER — Ambulatory Visit (INDEPENDENT_AMBULATORY_CARE_PROVIDER_SITE_OTHER): Payer: Medicare Other | Admitting: Family Medicine

## 2015-12-05 ENCOUNTER — Encounter: Payer: Self-pay | Admitting: Family Medicine

## 2015-12-05 VITALS — BP 108/64 | HR 73 | Resp 16 | Ht <= 58 in | Wt 137.0 lb

## 2015-12-05 DIAGNOSIS — R7303 Prediabetes: Secondary | ICD-10-CM | POA: Diagnosis not present

## 2015-12-05 DIAGNOSIS — I10 Essential (primary) hypertension: Secondary | ICD-10-CM

## 2015-12-05 DIAGNOSIS — E785 Hyperlipidemia, unspecified: Secondary | ICD-10-CM

## 2015-12-05 DIAGNOSIS — E559 Vitamin D deficiency, unspecified: Secondary | ICD-10-CM

## 2015-12-05 DIAGNOSIS — J302 Other seasonal allergic rhinitis: Secondary | ICD-10-CM | POA: Diagnosis not present

## 2015-12-05 DIAGNOSIS — E663 Overweight: Secondary | ICD-10-CM

## 2015-12-05 MED ORDER — METHYLPREDNISOLONE ACETATE 80 MG/ML IJ SUSP
80.0000 mg | Freq: Once | INTRAMUSCULAR | Status: AC
  Administered 2015-12-05: 80 mg via INTRAMUSCULAR

## 2015-12-05 MED ORDER — AZELASTINE HCL 0.1 % NA SOLN: 2.0000 | Freq: Two times a day (BID) | NASAL | 12 refills | Status: DC

## 2015-12-05 MED ORDER — PREDNISONE 5 MG (21) PO TBPK
5.0000 mg | ORAL_TABLET | ORAL | 0 refills | Status: DC
Start: 2015-12-05 — End: 2015-12-25

## 2015-12-05 NOTE — Assessment & Plan Note (Signed)
Uncontrolled, commit to daily singulair. Depo medrol 80 mg im and pred 5 mg dose pack add astellin

## 2015-12-05 NOTE — Patient Instructions (Addendum)
F/u in 3.5 months, call if you need me sooner  Your cough is from uncontrolled allergies Commit PLEASE once daily singulair Nasal spray , Astelin twice daily Prednisone dose pack for 6 days , then back  To your regular dose of prednisone Depo medrol given IM in office today  Fasting lipid, cmp , hBA1C and vit D in 3.5 months  Thanks for choosing [facility], we consider it a privelige to serve you.

## 2015-12-06 NOTE — Assessment & Plan Note (Signed)
Hyperlipidemia:Low fat diet discussed and encouraged. Controlled, no change in medication   Lipid Panel  Lab Results  Component Value Date   CHOL 150 05/24/2015   HDL 57 05/24/2015   LDLCALC 75 05/24/2015   TRIG 92 05/24/2015   CHOLHDL 2.6 05/24/2015

## 2015-12-06 NOTE — Assessment & Plan Note (Signed)
Patient educated about the importance of limiting  Carbohydrate intake , the need to commit to daily physical activity for a minimum of 30 minutes , and to commit weight loss. The fact that changes in all these areas will reduce or eliminate all together the development of diabetes is stressed.  Updated lab needed at/ before next visit.   Diabetic Labs Latest Ref Rng & Units 11/09/2015 05/24/2015 01/24/2015 03/30/2014 12/04/2013  HbA1c <5.7 % 6.4(H) - 6.6(H) 6.6(H) 6.3(H)  Chol 125 - 200 mg/dL - 150 125 120 -  HDL >=46 mg/dL - 57 52 65 -  Calc LDL <130 mg/dL - 75 51 38 -  Triglycerides <150 mg/dL - 92 111 84 -  Creatinine 0.60 - 0.88 mg/dL 0.97(H) - 1.08(H) - 1.02   BP/Weight 12/05/2015 11/26/2015 06/20/2015 06/17/2015 06/11/2015 05/28/2015 [id number]  Systolic BP 123XX123 0000000 Q000111Q 123456 [id number] 0000000 [id number]  Diastolic BP 64 64 77 74 67 74 74  Wt. (Lbs) 137 140.04 140 143.4 142.6 145 143  BMI 28.63 29.27 29.27 29.98 29.06 28.32 27.93   No flowsheet data found.

## 2015-12-06 NOTE — Assessment & Plan Note (Signed)
improved Patient re-educated about  the importance of commitment to a  minimum of 150 minutes of exercise per week.  The importance of healthy food choices with portion control discussed. Encouraged to start a food diary, count calories and to consider  joining a support group. Sample diet sheets offered. Goals set by the patient for the next several months.   Weight /BMI 12/05/2015 11/26/2015 06/20/2015  WEIGHT 137 lb 140 lb 0.6 oz 140 lb  HEIGHT 4\' 10"  4\' 10"  4\' 10"   BMI 28.63 kg/m2 29.27 kg/m2 29.27 kg/m2

## 2015-12-06 NOTE — Assessment & Plan Note (Signed)
Controlled, no change in medication DASH diet and commitment to daily physical activity for a minimum of 30 minutes discussed and encouraged, as a part of hypertension management. The importance of attaining a healthy weight is also discussed.  BP/Weight 12/05/2015 11/26/2015 06/20/2015 06/17/2015 06/11/2015 05/28/2015 [id number]  Systolic BP 123XX123 0000000 Q000111Q 123456 [id number] 0000000 [id number]  Diastolic BP 64 64 77 74 67 74 74  Wt. (Lbs) 137 140.04 140 143.4 142.6 145 143  BMI 28.63 29.27 29.27 29.98 29.06 28.32 27.93

## 2015-12-06 NOTE — Progress Notes (Signed)
Stacy Meyers     MRN: [id number]      DOB: 11-22-1932   HPI Stacy Meyers is here for follow up of acute bronchitis, states the cough is slightly improved however , remains troublesome, especially at night. No recent fever or chills, sputum is thick and clear.also c/o excess nasal drainage with sneezing and clear sputum, denies sore throat or productive cough  ROS  Denies chest pains, palpitations and leg swelling Denies abdominal pain, nausea, vomiting,diarrhea or constipation.   Denies dysuria, frequency, hesitancy or incontinence. Denies joint pain, swelling and limitation in mobility. Denies headaches, seizures, numbness, or tingling. Denies depression, anxiety or insomnia. Denies skin break down or rash.   PE  BP 108/64   Pulse 73   Resp 16   Ht 4\' 10"  (1.473 m)   Wt 137 lb (62.1 kg)   SpO2 96%   BMI 28.63 kg/m   Patient alert and oriented and in no cardiopulmonary distress.  HEENT: No facial asymmetry, EOMI,   oropharynx pink and moist.  Neck supple no JVD, no mass.erythema and edema of nasal mucosa with clear drainage , no sinus tenderness  Chest: adequate air entry, no crackles , few wheezes  CVS: S1, S2 no murmurs, no S3.Regular rate.  ABD: Soft non tender.   Ext: No edema  MS: Adequate ROM spine, shoulders, hips and knees.  Skin: Intact, no ulcerations or rash noted.  Psych: Good eye contact, normal affect. Memory intact not anxious or depressed appearing.  CNS: CN 2-12 intact, power,  normal throughout.no focal deficits noted.   Assessment & Plan  Seasonal allergies Uncontrolled, commit to daily singulair. Depo medrol 80 mg im and pred 5 mg dose pack add astellin  Essential hypertension, benign Controlled, no change in medication DASH diet and commitment to daily physical activity for a minimum of 30 minutes discussed and encouraged, as a part of hypertension management. The importance of attaining a healthy weight is also discussed.  BP/Weight  12/05/2015 11/26/2015 06/20/2015 06/17/2015 06/11/2015 05/28/2015 [id number]  Systolic BP 123XX123 0000000 Q000111Q 123456 [id number] 0000000 [id number]  Diastolic BP 64 64 77 74 67 74 74  Wt. (Lbs) 137 140.04 140 143.4 142.6 145 143  BMI 28.63 29.27 29.27 29.98 29.06 28.32 27.93       Overweight (BMI 25.0-29.9) improved Patient re-educated about  the importance of commitment to a  minimum of 150 minutes of exercise per week.  The importance of healthy food choices with portion control discussed. Encouraged to start a food diary, count calories and to consider  joining a support group. Sample diet sheets offered. Goals set by the patient for the next several months.   Weight /BMI 12/05/2015 11/26/2015 06/20/2015  WEIGHT 137 lb 140 lb 0.6 oz 140 lb  HEIGHT 4\' 10"  4\' 10"  4\' 10"   BMI 28.63 kg/m2 29.27 kg/m2 29.27 kg/m2      Prediabetes Patient educated about the importance of limiting  Carbohydrate intake , the need to commit to daily physical activity for a minimum of 30 minutes , and to commit weight loss. The fact that changes in all these areas will reduce or eliminate all together the development of diabetes is stressed.  Updated lab needed at/ before next visit.   Diabetic Labs Latest Ref Rng & Units 11/09/2015 05/24/2015 01/24/2015 03/30/2014 12/04/2013  HbA1c <5.7 % 6.4(H) - 6.6(H) 6.6(H) 6.3(H)  Chol 125 - 200 mg/dL - 150 125 120 -  HDL >=46 mg/dL - 57 52 65 -  Calc LDL <  130 mg/dL - 75 51 38 -  Triglycerides <150 mg/dL - 92 111 84 -  Creatinine 0.60 - 0.88 mg/dL 0.97(H) - 1.08(H) - 1.02   BP/Weight 12/05/2015 11/26/2015 06/20/2015 06/17/2015 06/11/2015 05/28/2015 [id number]  Systolic BP 123XX123 0000000 Q000111Q 123456 [id number] 0000000 [id number]  Diastolic BP 64 64 77 74 67 74 74  Wt. (Lbs) 137 140.04 140 143.4 142.6 145 143  BMI 28.63 29.27 29.27 29.98 29.06 28.32 27.93   No flowsheet data found.    Hyperlipidemia LDL goal <100 Hyperlipidemia:Low fat diet discussed and encouraged. Controlled, no change in medication   Lipid Panel  Lab  Results  Component Value Date   CHOL 150 05/24/2015   HDL 57 05/24/2015   LDLCALC 75 05/24/2015   TRIG 92 05/24/2015   CHOLHDL 2.6 05/24/2015

## 2015-12-24 DIAGNOSIS — M19071 Primary osteoarthritis, right ankle and foot: Secondary | ICD-10-CM | POA: Insufficient documentation

## 2015-12-24 DIAGNOSIS — M19072 Primary osteoarthritis, left ankle and foot: Secondary | ICD-10-CM

## 2015-12-24 DIAGNOSIS — M170 Bilateral primary osteoarthritis of knee: Secondary | ICD-10-CM | POA: Insufficient documentation

## 2015-12-24 DIAGNOSIS — M47816 Spondylosis without myelopathy or radiculopathy, lumbar region: Secondary | ICD-10-CM | POA: Insufficient documentation

## 2015-12-24 DIAGNOSIS — M19041 Primary osteoarthritis, right hand: Secondary | ICD-10-CM | POA: Insufficient documentation

## 2015-12-24 DIAGNOSIS — M19042 Primary osteoarthritis, left hand: Secondary | ICD-10-CM

## 2015-12-24 NOTE — Progress Notes (Signed)
Office Visit Note  Patient: Stacy Meyers             Date of Birth: 1932/05/27           MRN: [id number]             PCP: [name], MD Referring: Fayrene Helper, MD Visit Date: 12/25/2015 Occupation: '@GUAROCC' @    Subjective:  Follow-up Follow-up on rheumatoid arthritis and high-risk prescription, on methotrexate to per week, folic acid 1 per day, prednisone 5 mg every day  History of Present Illness: Stacy Meyers is a 82 y.o. female  Last seen 07/25/2015 Doing well with rheumatoid arthritis. No flares. History of ulnar deviation. Taking medication as prescribed as described above  Has a follow-up appointment with Dr. Moshe Cipro, PCP. On 03/12/2016.  Patient states that she is drinking more water. She recalls that she is drinking approximately 8 ounces 2-3 times a day.  Activities of Daily Living:  Patient reports morning stiffness for 30 minutes.   Patient Denies nocturnal pain.  Difficulty dressing/grooming: Denies Difficulty climbing stairs: Reports Difficulty getting out of chair: Reports Difficulty using hands for taps, buttons, cutlery, and/or writing: Denies   Review of Systems  Constitutional: Negative for fatigue.  HENT: Negative for mouth sores and mouth dryness.   Eyes: Negative for dryness.  Respiratory: Negative for shortness of breath.   Gastrointestinal: Negative for constipation and diarrhea.  Musculoskeletal: Negative for myalgias and myalgias.  Skin: Negative for sensitivity to sunlight.  Psychiatric/Behavioral: Negative for decreased concentration and sleep disturbance.    PMFS History:  Patient Active Problem List   Diagnosis Date Noted  . History of hypertension 12/25/2015  . History of prediabetes 12/25/2015  . Spondylosis of lumbar region without myelopathy or radiculopathy 12/24/2015  . Primary osteoarthritis of both hands 12/24/2015  . Primary osteoarthritis of both knees 12/24/2015  . Primary osteoarthritis of both feet  12/24/2015  . Solitary bone cyst of right shoulder 05/28/2015  . Right knee pain 05/28/2015  . Osteoarthritis of both shoulders 05/28/2015  . Overweight (BMI 25.0-29.9) 04/28/2014  . Nocturia 12/04/2013  . Ganglion cyst of wrist 05/11/2012  . Systolic murmur 18/29/9371  . Diastolic dysfunction 69/67/8938  . Seasonal allergies 08/28/2010  . Prediabetes 07/25/2009  . BACK PAIN WITH RADICULOPATHY 04/22/2009  . Herpes simplex virus (HSV) infection 09/16/2008  . Rheumatoid arthritis with rheumatoid factor of multiple sites without organ or systems involvement ([place]) 09/16/2008  . Hyperlipidemia LDL goal <100 05/23/2008  . Essential hypertension, benign 05/23/2008    Past Medical History:  Diagnosis Date  . Acute cholecystitis 12/16/2010  . Anemia 12/14/2010  . ARF (acute renal failure) ([place]) 12/14/2010  . Bradycardia    Sinus - noted 6/11  . Cholelithiasis 12/14/2010  . Collagen vascular disease ([place])   . Diastolic dysfunction 10/21/5100   Grade 1 per 2-D echocardiogram  . Dysrhythmia   . Essential hypertension, benign   . HTN (hypertension) 12/14/2010  . Hypercalcemia 12/14/2010  . Hypokalemia 12/14/2010  . Mixed hyperlipidemia   . Osteoarthritis   . Rheumatoid arthritis(714.0)     Family History  Problem Relation Age of Onset  . Stroke Mother   . Cancer Father   . Diabetes Sister   . Diabetes Sister   . Kidney failure Brother   . Kidney failure Brother    Past Surgical History:  Procedure Laterality Date  . Bilateral cataract surgery  2009  . BUNIONECTOMY Right 2012  . CHOLECYSTECTOMY  12/17/2010   Procedure: LAPAROSCOPIC  CHOLECYSTECTOMY;  Surgeon: Donato Heinz;  Location: [facility] ORS;  Service: General;  Laterality: N/A;  . COLONOSCOPY  05/22/2011   Procedure: COLONOSCOPY;  Surgeon: Rogene Houston, MD;  Location: [facility] END[facility] SU[facility];  Service: Endoscopy;  Laterality: N/A;  1045  . EYE SURGERY Bilateral    cataract  . GANGLION CYST EXCISION Left 06/02/2012   Procedure: Excision  ganglion left dorsal wrist;  Surgeon: Sanjuana Kava, MD;  Location: [facility] ORS;  Service: Orthopedics;  Laterality: Left;  . L2-L3 discectomy  2002  . L3-L4, L4-L5 lumbar decompression  2011  . Right shoulder cyst excision  2010  . SPINE SURGERY     twice, approx 2006, 2011   Social History   Social History Narrative  . No narrative on file     Objective: Vital Signs: BP 130/80   Pulse 68   Resp 14   Ht '4\' 11"'  (1.499 m)   Wt 137 lb (62.1 kg)   BMI 27.67 kg/m    Physical Exam  Constitutional: She is oriented to person, place, and time. She appears well-developed and well-nourished.  HENT:  Head: Normocephalic and atraumatic.  Eyes: EOM are normal. Pupils are equal, round, and reactive to light.  Cardiovascular: Normal rate, regular rhythm and normal heart sounds.  Exam reveals no gallop and no friction rub.   No murmur heard. Pulmonary/Chest: Effort normal and breath sounds normal. She has no wheezes. She has no rales.  Abdominal: Soft. Bowel sounds are normal. She exhibits no distension. There is no tenderness. There is no guarding. No hernia.  Musculoskeletal: Normal range of motion. She exhibits no edema, tenderness or deformity.  Lymphadenopathy:    She has no cervical adenopathy.  Neurological: She is alert and oriented to person, place, and time. Coordination normal.  Skin: Skin is warm and dry. Capillary refill takes less than 2 seconds. No rash noted.  Psychiatric: She has a normal mood and affect. Her behavior is normal.     Musculoskeletal Exam:  Full range of motion of all joints except decreased range of motion of bilateral shoulder joints. It's about 45 of abduction bilaterally. Grip strength is equal and strong bilaterally Fibromyalgia tender points are all absent  CDAI Exam: CDAI Homunculus Exam:   Joint Counts:  CDAI Tender Joint count: 0 CDAI Swollen Joint count: 0  Global Assessments:  Patient Global Assessment: 0 Provider Global Assessment: 0  No  synovitis Mild ulnar deviation No change from the last visit on July 2017  Investigation: Findings:  05/29/2015 negative PPD  07/25/2015 :  X-rays bilateral hands 2 views shows all joint spaces are narrowed.  Specifically, decreased joint space in the DIP, PIP joints, MCP joints, intercarpal, radiocarpal, ulnocarpal joint and ulnar styloid.  No erosion.  There is subluxations to bilateral first MCP.   Orders Only on 11/09/2015  Component Date Value Ref Range Status  . Sodium 11/09/2015 140  135 - 146 mmol/L Final  . Potassium 11/09/2015 3.8  3.5 - 5.3 mmol/L Final  . Chloride 11/09/2015 106  98 - 110 mmol/L Final  . CO2 11/09/2015 27  20 - 31 mmol/L Final  . Glucose, Bld 11/09/2015 92  65 - 99 mg/dL Final  . BUN 11/09/2015 21  7 - 25 mg/dL Final  . Creat 11/09/2015 0.97* 0.60 - 0.88 mg/dL Final   Comment:   For patients > or = 80 years of age: The upper reference limit for Creatinine is approximately 13% higher for people identified as African-American.     Marland Kitchen  Total Bilirubin 11/09/2015 0.7  0.2 - 1.2 mg/dL Final  . Alkaline Phosphatase 11/09/2015 55  33 - 130 U/L Final  . AST 11/09/2015 13  10 - 35 U/L Final  . ALT 11/09/2015 8  6 - 29 U/L Final  . Total Protein 11/09/2015 6.5  6.1 - 8.1 g/dL Final  . Albumin 11/09/2015 4.1  3.6 - 5.1 g/dL Final  . Calcium 11/09/2015 9.8  8.6 - 10.4 mg/dL Final  . GFR, Est African American 11/09/2015 63  >=60 mL/min Final  . GFR, Est Non African American 11/09/2015 55* >=60 mL/min Final  . WBC 11/09/2015 5.6  3.8 - 10.8 K/uL Final  . RBC 11/09/2015 4.22  3.80 - 5.10 MIL/uL Final  . Hemoglobin 11/09/2015 13.3  11.7 - 15.5 g/dL Final  . HCT 11/09/2015 39.4  35.0 - 45.0 % Final  . MCV 11/09/2015 93.4  80.0 - 100.0 fL Final  . MCH 11/09/2015 31.5  27.0 - 33.0 pg Final  . MCHC 11/09/2015 33.8  32.0 - 36.0 g/dL Final  . RDW 11/09/2015 13.5  11.0 - 15.0 % Final  . Platelets 11/09/2015 253  140 - 400 K/uL Final  . MPV 11/09/2015 8.7  7.5 - 12.5 fL  Final  . Neutro Abs 11/09/2015 2912  1,500 - 7,800 cells/uL Final  . Lymphs Abs 11/09/2015 2184  850 - 3,900 cells/uL Final  . Monocytes Absolute 11/09/2015 336  200 - 950 cells/uL Final  . Eosinophils Absolute 11/09/2015 112  15 - 500 cells/uL Final  . Basophils Absolute 11/09/2015 56  0 - 200 cells/uL Final  . Neutrophils Relative % 11/09/2015 52  % Final  . Lymphocytes Relative 11/09/2015 39  % Final  . Monocytes Relative 11/09/2015 6  % Final  . Eosinophils Relative 11/09/2015 2  % Final  . Basophils Relative 11/09/2015 1  % Final  . Smear Review 11/09/2015 Criteria for review not met   Final    Imaging: No results found.  Speciality Comments: No specialty comments available.    Procedures:  No procedures performed Allergies: Aspirin and Sudafed [pseudoephedrine hcl]   Assessment / Plan:     Visit Diagnoses: Rheumatoid arthritis with rheumatoid factor of multiple sites without organ or systems involvement (HCC) - +RF +CCP  High risk medications (not anticoagulants) long-term use - MTX 2/week; Folic 1/qd; prednisone 56m qd long term;  - Plan: CBC with Differential/Platelet, Comprehensive metabolic panel  Chronic kidney disease, unspecified CKD stage - 07/22/2015: ===> Cr = 1.25; GFR = 47 (SEES DR. PFlorene Glen.  Spondylosis of lumbar region without myelopathy or radiculopathy  Primary osteoarthritis of both hands  Primary osteoarthritis of both knees  Primary osteoarthritis of both feet  History of hypertension  History of prediabetes   PLAN ===>  Number 1:  Dr. SMoshe Ciprodoes labs similar to the labs that we need and I have advised the patient to not get those labs to close together and we can probably share the same labs. Her next labs for our office will be due first week of February 2018.   #2: Refill methotrexate to per week ninety-day supply with no refills #3 refill folic acid 1 mg daily ninety-day supply with 4 refills #4 refill prednisone 5 mg every morning 100  pills with 1 refill #5 CBC with differential CMP with GFR every 3 months starting first week of February 2018 #6: PCP orders and monitors patient's osteoporosis #7: Patient's kidney functions have improved versus of July 2017 visit. We will continue to  monitor. Patient states that she is drinking adequate amount of water.  Orders: Orders Placed This Encounter  Procedures  . CBC with Differential/Platelet  . Comprehensive metabolic panel   Meds ordered this encounter  Medications  . methotrexate (RHEUMATREX) 2.5 MG tablet    Sig: Take 2 tablets (5 mg total) by mouth once a week. Caution:Chemotherapy. Protect from light.    Dispense:  24 tablet    Refill:  0    Patient is on low-dose methotrexate OF 2 pills / week; Confirm that patient is taking 2 pills once a week. Any previous prescriptions should be ignored.    Order Specific Question:   Supervising Provider    Answer:   Lyda Perone  . folic acid (FOLVITE) 1 MG tablet    Sig: Take 1 tablet (1 mg total) by mouth daily.    Dispense:  90 tablet    Refill:  4    Order Specific Question:   Supervising Provider    Answer:   Bo Merino [phone]    Face-to-face time spent with patient was 30 minutes. 50% of time was spent in counseling and coordination of care.  Follow-Up Instructions: Return in about 5 months (around 05/24/2016).   Eliezer Lofts, PA-C   I examined and evaluated the patient with Eliezer Lofts PA.She had no synovitis on exam today. The plan of care was discussed as noted above.  Bo Merino, MD

## 2015-12-25 ENCOUNTER — Encounter: Payer: Self-pay | Admitting: Rheumatology

## 2015-12-25 ENCOUNTER — Ambulatory Visit (INDEPENDENT_AMBULATORY_CARE_PROVIDER_SITE_OTHER): Payer: Medicare Other | Admitting: Rheumatology

## 2015-12-25 VITALS — BP 130/80 | HR 68 | Resp 14 | Ht 59.0 in | Wt 137.0 lb

## 2015-12-25 DIAGNOSIS — M0579 Rheumatoid arthritis with rheumatoid factor of multiple sites without organ or systems involvement: Secondary | ICD-10-CM

## 2015-12-25 DIAGNOSIS — M47816 Spondylosis without myelopathy or radiculopathy, lumbar region: Secondary | ICD-10-CM | POA: Diagnosis not present

## 2015-12-25 DIAGNOSIS — Z8679 Personal history of other diseases of the circulatory system: Secondary | ICD-10-CM

## 2015-12-25 DIAGNOSIS — M19071 Primary osteoarthritis, right ankle and foot: Secondary | ICD-10-CM

## 2015-12-25 DIAGNOSIS — M170 Bilateral primary osteoarthritis of knee: Secondary | ICD-10-CM | POA: Diagnosis not present

## 2015-12-25 DIAGNOSIS — M19072 Primary osteoarthritis, left ankle and foot: Secondary | ICD-10-CM | POA: Diagnosis not present

## 2015-12-25 DIAGNOSIS — M19042 Primary osteoarthritis, left hand: Secondary | ICD-10-CM

## 2015-12-25 DIAGNOSIS — Z79899 Other long term (current) drug therapy: Secondary | ICD-10-CM

## 2015-12-25 DIAGNOSIS — Z87898 Personal history of other specified conditions: Secondary | ICD-10-CM

## 2015-12-25 DIAGNOSIS — M19041 Primary osteoarthritis, right hand: Secondary | ICD-10-CM | POA: Diagnosis not present

## 2015-12-25 DIAGNOSIS — N189 Chronic kidney disease, unspecified: Secondary | ICD-10-CM

## 2015-12-25 MED ORDER — FOLIC ACID 1 MG PO TABS: 1.0000 mg | ORAL_TABLET | Freq: Every day | ORAL | 4 refills | Status: AC

## 2015-12-25 MED ORDER — METHOTREXATE 2.5 MG PO TABS: 5.0000 mg | ORAL_TABLET | ORAL | 0 refills | Status: AC

## 2015-12-25 MED ORDER — PREDNISONE 5 MG PO TABS: 5.0000 mg | ORAL_TABLET | Freq: Every day | ORAL | 1 refills | Status: DC

## 2015-12-25 NOTE — Patient Instructions (Signed)
Standing Labs We placed an order today for your standing lab work.    Please come back and get your standing labs in FIRST WEEK OF FEB 2018; NEED CBC W/ DIFF & CMP W/ GFR;   We have open lab Monday through Friday from 8:30-11:30 AM and 1:30-4 PM at the office of Dr. Tresa Moore, PA.   The office is located at [address], [place], [place], [place] [address] No appointment is necessary.   Labs are drawn by [facility].  You may receive a bill from [facility] for your lab work.

## 2015-12-26 ENCOUNTER — Other Ambulatory Visit: Payer: Self-pay | Admitting: *Deleted

## 2016-01-11 ENCOUNTER — Other Ambulatory Visit: Payer: Self-pay | Admitting: Family Medicine

## 2016-02-07 ENCOUNTER — Other Ambulatory Visit: Payer: Self-pay | Admitting: Rheumatology

## 2016-02-07 NOTE — Telephone Encounter (Signed)
Last Visit: 12/25/15 Next Visit: 05/27/16 Labs: 11/09/15 c/w previous   Okay to refill MTX?

## 2016-02-09 ENCOUNTER — Other Ambulatory Visit: Payer: Self-pay | Admitting: Family Medicine

## 2016-02-24 DIAGNOSIS — E785 Hyperlipidemia, unspecified: Secondary | ICD-10-CM | POA: Diagnosis not present

## 2016-02-24 DIAGNOSIS — E559 Vitamin D deficiency, unspecified: Secondary | ICD-10-CM | POA: Diagnosis not present

## 2016-02-24 DIAGNOSIS — I10 Essential (primary) hypertension: Secondary | ICD-10-CM | POA: Diagnosis not present

## 2016-02-24 DIAGNOSIS — R7303 Prediabetes: Secondary | ICD-10-CM | POA: Diagnosis not present

## 2016-02-24 LAB — HEMOGLOBIN A1C
Hgb A1c MFr Bld: 6.8 % — ABNORMAL HIGH (ref <5.7)
Mean Plasma Glucose: 148 mg/dL

## 2016-02-25 ENCOUNTER — Other Ambulatory Visit: Payer: Self-pay | Admitting: Family Medicine

## 2016-02-25 LAB — COMPREHENSIVE METABOLIC PANEL
ALK PHOS: 53 U/L (ref 33–130)
ALT: 10 U/L (ref 6–29)
AST: 13 U/L (ref 10–35)
Albumin: 4.1 g/dL (ref 3.6–5.1)
BILIRUBIN TOTAL: 0.7 mg/dL (ref 0.2–1.2)
BUN: 24 mg/dL (ref 7–25)
CALCIUM: 9.9 mg/dL (ref 8.6–10.4)
CO2: 30 mmol/L (ref 20–31)
CREATININE: 0.97 mg/dL — AB (ref 0.60–0.88)
Chloride: 107 mmol/L (ref 98–110)
GLUCOSE: 123 mg/dL — AB (ref 65–99)
Potassium: 3.8 mmol/L (ref 3.5–5.3)
SODIUM: 142 mmol/L (ref 135–146)
Total Protein: 6.4 g/dL (ref 6.1–8.1)

## 2016-02-25 LAB — LIPID PANEL
CHOLESTEROL: 171 mg/dL (ref <200)
HDL: 63 mg/dL (ref >50)
LDL CALC: 82 mg/dL (ref <100)
TRIGLYCERIDES: 130 mg/dL (ref <150)
Total CHOL/HDL Ratio: 2.7 Ratio (ref <5.0)
VLDL: 26 mg/dL (ref <30)

## 2016-02-25 LAB — VITAMIN D 25 HYDROXY (VIT D DEFICIENCY, FRACTURES): VIT D 25 HYDROXY: 22 ng/mL — AB (ref 30–100)

## 2016-02-28 ENCOUNTER — Other Ambulatory Visit: Payer: Self-pay

## 2016-02-28 MED ORDER — ERGOCALCIFEROL 1.25 MG (50000 UT) PO CAPS: 50000.0000 [IU] | ORAL_CAPSULE | ORAL | 0 refills | Status: DC

## 2016-03-10 ENCOUNTER — Ambulatory Visit: Payer: Medicare Other

## 2016-03-10 ENCOUNTER — Ambulatory Visit: Payer: Medicare Other | Admitting: Family Medicine

## 2016-03-12 ENCOUNTER — Other Ambulatory Visit: Payer: Self-pay | Admitting: Rheumatology

## 2016-03-12 DIAGNOSIS — Z79899 Other long term (current) drug therapy: Secondary | ICD-10-CM | POA: Diagnosis not present

## 2016-03-12 LAB — CBC WITH DIFFERENTIAL/PLATELET
Basophils Absolute: 0 cells/uL (ref 0–200)
Basophils Relative: 0 %
EOS PCT: 2 %
Eosinophils Absolute: 136 cells/uL (ref 15–500)
HCT: 39.3 % (ref 35.0–45.0)
HEMOGLOBIN: 12.9 g/dL (ref 11.7–15.5)
LYMPHS ABS: 1836 {cells}/uL (ref 850–3900)
Lymphocytes Relative: 27 %
MCH: 31 pg (ref 27.0–33.0)
MCHC: 32.8 g/dL (ref 32.0–36.0)
MCV: 94.5 fL (ref 80.0–100.0)
MONOS PCT: 8 %
MPV: 8.9 fL (ref 7.5–12.5)
Monocytes Absolute: 544 cells/uL (ref 200–950)
NEUTROS ABS: 4284 {cells}/uL (ref 1500–7800)
NEUTROS PCT: 63 %
PLATELETS: 242 10*3/uL (ref 140–400)
RBC: 4.16 MIL/uL (ref 3.80–5.10)
RDW: 14.9 % (ref 11.0–15.0)
WBC: 6.8 10*3/uL (ref 3.8–10.8)

## 2016-03-12 LAB — COMPLETE METABOLIC PANEL WITH GFR
ALBUMIN: 4 g/dL (ref 3.6–5.1)
ALK PHOS: 56 U/L (ref 33–130)
ALT: 6 U/L (ref 6–29)
AST: 12 U/L (ref 10–35)
BILIRUBIN TOTAL: 0.4 mg/dL (ref 0.2–1.2)
BUN: 22 mg/dL (ref 7–25)
CALCIUM: 9.9 mg/dL (ref 8.6–10.4)
CO2: 24 mmol/L (ref 20–31)
Chloride: 107 mmol/L (ref 98–110)
Creat: 0.9 mg/dL — ABNORMAL HIGH (ref 0.60–0.88)
GFR, EST NON AFRICAN AMERICAN: 60 mL/min (ref >=60)
GFR, Est African American: 69 mL/min (ref >=60)
Glucose, Bld: 93 mg/dL (ref 65–99)
POTASSIUM: 4.1 mmol/L (ref 3.5–5.3)
Sodium: 143 mmol/L (ref 135–146)
Total Protein: 6.5 g/dL (ref 6.1–8.1)

## 2016-03-13 NOTE — Progress Notes (Signed)
wnl

## 2016-03-16 ENCOUNTER — Telehealth: Payer: Self-pay | Admitting: Radiology

## 2016-03-16 NOTE — Telephone Encounter (Signed)
-----   Message from Bo Merino, MD sent at 03/13/2016  1:32 PM EST ----- wnl

## 2016-03-16 NOTE — Telephone Encounter (Signed)
I have called patient to advise labs are normal  

## 2016-03-25 ENCOUNTER — Ambulatory Visit: Payer: Medicare Other | Admitting: Family Medicine

## 2016-03-27 ENCOUNTER — Telehealth: Payer: Self-pay | Admitting: Family Medicine

## 2016-03-27 ENCOUNTER — Other Ambulatory Visit: Payer: Self-pay | Admitting: Family Medicine

## 2016-04-10 ENCOUNTER — Telehealth: Payer: Self-pay | Admitting: Family Medicine

## 2016-04-14 NOTE — Telephone Encounter (Signed)
She is scheduled for May 2nd at 10:00 and she is aware

## 2016-04-14 NOTE — Telephone Encounter (Signed)
pls schedule follow up app[ointment with me first week in May, I caklled her and she needs to be seen by me, she is expecting a call with appointment , thanks

## 2016-04-22 ENCOUNTER — Ambulatory Visit: Payer: Medicare Other

## 2016-04-22 ENCOUNTER — Telehealth: Payer: Self-pay | Admitting: Family Medicine

## 2016-04-22 NOTE — Telephone Encounter (Signed)
Patient's granddaughter  Thornell Sartorius) calling in ref to her grandmother.  Patient was requesting an appt to see Dr. Moshe Cipro for swelling in both hands. She is scheduled for appt to see [facility] and Dr. Moshe Cipro on 05/06/16.  She is aware that Dr. Moshe Cipro is not in the office until Monday.  In the past patient has been given prednisone to help with the swelling and would be open to trying this again until seeing Dr Moshe Cipro.  Patient uses [facility] in [place]

## 2016-04-22 NOTE — Telephone Encounter (Signed)
She got prednisone from her rheumatologist last visit and was asked to come back in 2 months.  Does not look like she did.  I recommend she follow up with her rheumatologist if her arthritis is flaring up.

## 2016-04-22 NOTE — Telephone Encounter (Signed)
Has appt tomorrow with rheumatology

## 2016-04-23 ENCOUNTER — Encounter: Payer: Self-pay | Admitting: Rheumatology

## 2016-04-23 ENCOUNTER — Ambulatory Visit (INDEPENDENT_AMBULATORY_CARE_PROVIDER_SITE_OTHER): Payer: Medicare Other | Admitting: Rheumatology

## 2016-04-23 VITALS — BP 130/62 | HR 74 | Resp 16 | Ht 62.0 in | Wt 119.0 lb

## 2016-04-23 DIAGNOSIS — M810 Age-related osteoporosis without current pathological fracture: Secondary | ICD-10-CM | POA: Diagnosis not present

## 2016-04-23 DIAGNOSIS — Z79899 Other long term (current) drug therapy: Secondary | ICD-10-CM

## 2016-04-23 DIAGNOSIS — M0579 Rheumatoid arthritis with rheumatoid factor of multiple sites without organ or systems involvement: Secondary | ICD-10-CM

## 2016-04-23 DIAGNOSIS — M25561 Pain in right knee: Secondary | ICD-10-CM | POA: Diagnosis not present

## 2016-04-23 MED ORDER — LIDOCAINE HCL 1 % IJ SOLN
1.5000 mL | INTRAMUSCULAR | Status: AC | PRN
  Administered 2016-04-23: 1.5 mL

## 2016-04-23 MED ORDER — TRIAMCINOLONE ACETONIDE 40 MG/ML IJ SUSP
40.0000 mg | INTRAMUSCULAR | Status: AC | PRN
  Administered 2016-04-23: 40 mg via INTRA_ARTICULAR

## 2016-04-23 NOTE — Progress Notes (Signed)
Office Visit Note  Patient: Stacy Meyers             Date of Birth: 01/11/1932           MRN: [id number]             PCP: [name], MD Referring: Fayrene Helper, MD Visit Date: 04/23/2016 Occupation: _0 @    Subjective:  Pain of the Right Hand and Pain of the Left Hand   History of Present Illness: Stacy Meyers is a 83 y.o. female  Last seen 12/25/2015 and 07/25/2015 Patient presents today because she's had a flare that involves bilateral hands in the right knee. Her daughter, who lives [place], call our office to make the appointment for her today. Patient is actually scheduled to be seen sometime in May 2018 for regular follow-up.  Patient has been taking her medication as prescribed.  Patient has a history of osteoporosis and is on alendronate and this is monitored by her PCP.  She recently had labs done and they're within normal limits    Activities of Daily Living:  Patient reports morning stiffness for 15 minutes.   Patient Reports nocturnal pain.  Difficulty dressing/grooming: Reports Difficulty climbing stairs: Reports Difficulty getting out of chair: Reports Difficulty using hands for taps, buttons, cutlery, and/or writing: Reports   Review of Systems  Constitutional: Negative for fatigue.  HENT: Negative for mouth sores and mouth dryness.   Eyes: Negative for dryness.  Respiratory: Negative for shortness of breath.   Gastrointestinal: Negative for constipation and diarrhea.  Musculoskeletal: Negative for myalgias and myalgias.  Skin: Negative for sensitivity to sunlight.  Psychiatric/Behavioral: Negative for decreased concentration and sleep disturbance.    PMFS History:  Patient Active Problem List   Diagnosis Date Noted  . History of hypertension 12/25/2015  . History of prediabetes 12/25/2015  . Spondylosis of lumbar region without myelopathy or radiculopathy 12/24/2015  . Primary osteoarthritis of both hands 12/24/2015  .  Primary osteoarthritis of both knees 12/24/2015  . Primary osteoarthritis of both feet 12/24/2015  . Solitary bone cyst of right shoulder 05/28/2015  . Right knee pain 05/28/2015  . Osteoarthritis of both shoulders 05/28/2015  . Overweight (BMI 25.0-29.9) 04/28/2014  . Nocturia 12/04/2013  . Ganglion cyst of wrist 05/11/2012  . Systolic murmur 19/62/2297  . Diastolic dysfunction 98/92/1194  . Seasonal allergies 08/28/2010  . Prediabetes 07/25/2009  . BACK PAIN WITH RADICULOPATHY 04/22/2009  . Herpes simplex virus (HSV) infection 09/16/2008  . Rheumatoid arthritis with rheumatoid factor of multiple sites without organ or systems involvement ([place]) 09/16/2008  . Hyperlipidemia LDL goal <100 05/23/2008  . Essential hypertension, benign 05/23/2008    Past Medical History:  Diagnosis Date  . Acute cholecystitis 12/16/2010  . Anemia 12/14/2010  . ARF (acute renal failure) ([place]) 12/14/2010  . Bradycardia    Sinus - noted 6/11  . Cholelithiasis 12/14/2010  . Collagen vascular disease ([place])   . Diastolic dysfunction 17/40/8144   Grade 1 per 2-D echocardiogram  . Dysrhythmia   . Essential hypertension, benign   . HTN (hypertension) 12/14/2010  . Hypercalcemia 12/14/2010  . Hypokalemia 12/14/2010  . Mixed hyperlipidemia   . Osteoarthritis   . Rheumatoid arthritis(714.0)     Family History  Problem Relation Age of Onset  . Stroke Mother   . Cancer Father   . Diabetes Sister   . Diabetes Sister   . Kidney failure Brother   . Kidney failure Brother    Past Surgical History:  Procedure Laterality Date  . Bilateral cataract surgery  2009  . BUNIONECTOMY Right 2012  . CHOLECYSTECTOMY  12/17/2010   Procedure: LAPAROSCOPIC CHOLECYSTECTOMY;  Surgeon: Donato Heinz;  Location: [facility] ORS;  Service: General;  Laterality: N/A;  . COLONOSCOPY  05/22/2011   Procedure: COLONOSCOPY;  Surgeon: Rogene Houston, MD;  Location: [facility] SU[facility];  Service: Endoscopy;  Laterality: N/A;  1045  . EYE SURGERY  Bilateral    cataract  . GANGLION CYST EXCISION Left 06/02/2012   Procedure: Excision ganglion left dorsal wrist;  Surgeon: Sanjuana Kava, MD;  Location: [facility] ORS;  Service: Orthopedics;  Laterality: Left;  . L2-L3 discectomy  2002  . L3-L4, L4-L5 lumbar decompression  2011  . Right shoulder cyst excision  2010  . SPINE SURGERY     twice, approx 2006, 2011   Social History   Social History Narrative  . No narrative on file     Objective: Vital Signs: BP 130/62 (BP Location: Left Arm)   Pulse 74   Resp 16   Ht _0  (1.575 m)   Wt 119 lb (54 kg)   BMI 21.77 kg/m    Physical Exam  Constitutional: She is oriented to person, place, and time. She appears well-developed and well-nourished.  HENT:  Head: Normocephalic and atraumatic.  Eyes: EOM are normal. Pupils are equal, round, and reactive to light.  Cardiovascular: Normal rate, regular rhythm and normal heart sounds.  Exam reveals no gallop and no friction rub.   No murmur heard. Pulmonary/Chest: Effort normal and breath sounds normal. She has no wheezes. She has no rales.  Abdominal: Soft. Bowel sounds are normal. She exhibits no distension. There is no tenderness. There is no guarding. No hernia.  Musculoskeletal: Normal range of motion. She exhibits no edema, tenderness or deformity.  Lymphadenopathy:    She has no cervical adenopathy.  Neurological: She is alert and oriented to person, place, and time. Coordination normal.  Skin: Skin is warm and dry. Capillary refill takes less than 2 seconds. No rash noted.  Psychiatric: She has a normal mood and affect. Her behavior is normal.  Nursing note and vitals reviewed.    Musculoskeletal Exam:  Decrease AB duction of bilateral shoulders. History of same. Can barely make it up to 90  CDAI Exam: CDAI Homunculus Exam:   Joint Counts:  CDAI Tender Joint count: 0 CDAI Swollen Joint count: 0     Investigation: No additional findings.  Orders Only on 03/12/2016    Component Date Value Ref Range Status  . Sodium 03/12/2016 143  135 - 146 mmol/L Final  . Potassium 03/12/2016 4.1  3.5 - 5.3 mmol/L Final  . Chloride 03/12/2016 107  98 - 110 mmol/L Final  . CO2 03/12/2016 24  20 - 31 mmol/L Final  . Glucose, Bld 03/12/2016 93  65 - 99 mg/dL Final  . BUN 03/12/2016 22  7 - 25 mg/dL Final  . Creat 03/12/2016 0.90* 0.60 - 0.88 mg/dL Final   Comment:   For patients > or = 81 years of age: The upper reference limit for Creatinine is approximately 13% higher for people identified as African-American.     . Total Bilirubin 03/12/2016 0.4  0.2 - 1.2 mg/dL Final  . Alkaline Phosphatase 03/12/2016 56  33 - 130 U/L Final  . AST 03/12/2016 12  10 - 35 U/L Final  . ALT 03/12/2016 6  6 - 29 U/L Final  . Total Protein 03/12/2016 6.5  6.1 - 8.1  g/dL Final  . Albumin 03/12/2016 4.0  3.6 - 5.1 g/dL Final  . Calcium 03/12/2016 9.9  8.6 - 10.4 mg/dL Final  . GFR, Est African American 03/12/2016 69  >=60 mL/min Final  . GFR, Est Non African American 03/12/2016 60  >=60 mL/min Final  . WBC 03/12/2016 6.8  3.8 - 10.8 K/uL Final  . RBC 03/12/2016 4.16  3.80 - 5.10 MIL/uL Final  . Hemoglobin 03/12/2016 12.9  11.7 - 15.5 g/dL Final  . HCT 03/12/2016 39.3  35.0 - 45.0 % Final  . MCV 03/12/2016 94.5  80.0 - 100.0 fL Final  . MCH 03/12/2016 31.0  27.0 - 33.0 pg Final  . MCHC 03/12/2016 32.8  32.0 - 36.0 g/dL Final  . RDW 03/12/2016 14.9  11.0 - 15.0 % Final  . Platelets 03/12/2016 242  140 - 400 K/uL Final  . MPV 03/12/2016 8.9  7.5 - 12.5 fL Final  . Neutro Abs 03/12/2016 4284  1,500 - 7,800 cells/uL Final  . Lymphs Abs 03/12/2016 1836  850 - 3,900 cells/uL Final  . Monocytes Absolute 03/12/2016 544  200 - 950 cells/uL Final  . Eosinophils Absolute 03/12/2016 136  15 - 500 cells/uL Final  . Basophils Absolute 03/12/2016 0  0 - 200 cells/uL Final  . Neutrophils Relative % 03/12/2016 63  % Final  . Lymphocytes Relative 03/12/2016 27  % Final  . Monocytes Relative  03/12/2016 8  % Final  . Eosinophils Relative 03/12/2016 2  % Final  . Basophils Relative 03/12/2016 0  % Final  . Smear Review 03/12/2016 Criteria for review not met   Final  Office Visit on 12/05/2015  Component Date Value Ref Range Status  . Cholesterol 02/24/2016 171  <200 mg/dL Final  . Triglycerides 02/24/2016 130  <150 mg/dL Final  . HDL 02/24/2016 63  >50 mg/dL Final  . Total CHOL/HDL Ratio 02/24/2016 2.7  <5.0 Ratio Final  . VLDL 02/24/2016 26  <30 mg/dL Final  . LDL Cholesterol 02/24/2016 82  <100 mg/dL Final  . Sodium 02/24/2016 142  135 - 146 mmol/L Final  . Potassium 02/24/2016 3.8  3.5 - 5.3 mmol/L Final  . Chloride 02/24/2016 107  98 - 110 mmol/L Final  . CO2 02/24/2016 30  20 - 31 mmol/L Final  . Glucose, Bld 02/24/2016 123* 65 - 99 mg/dL Final  . BUN 02/24/2016 24  7 - 25 mg/dL Final  . Creat 02/24/2016 0.97* 0.60 - 0.88 mg/dL Final   Comment:   For patients > or = 81 years of age: The upper reference limit for Creatinine is approximately 13% higher for people identified as African-American.     . Total Bilirubin 02/24/2016 0.7  0.2 - 1.2 mg/dL Final  . Alkaline Phosphatase 02/24/2016 53  33 - 130 U/L Final  . AST 02/24/2016 13  10 - 35 U/L Final  . ALT 02/24/2016 10  6 - 29 U/L Final  . Total Protein 02/24/2016 6.4  6.1 - 8.1 g/dL Final  . Albumin 02/24/2016 4.1  3.6 - 5.1 g/dL Final  . Calcium 02/24/2016 9.9  8.6 - 10.4 mg/dL Final  . Hgb A1c MFr Bld 02/24/2016 6.8* <5.7 % Final   Comment:   For someone without known diabetes, a hemoglobin A1c value of 6.5% or greater indicates that they may have diabetes and this should be confirmed with a follow-up test.   For someone with known diabetes, a value <7% indicates that their diabetes is well controlled and a value  greater than or equal to 7% indicates suboptimal control. A1c targets should be individualized based on duration of diabetes, age, comorbid conditions, and other considerations.   Currently, no  consensus exists for use of hemoglobin A1c for diagnosis of diabetes for children.     . Mean Plasma Glucose 02/24/2016 148  mg/dL Final  . Vit D, 25-Hydroxy 02/24/2016 22* 30 - 100 ng/mL Final   Comment: Vitamin D Status           25-OH Vitamin D        Deficiency                <20 ng/mL        Insufficiency         20 - 29 ng/mL        Optimal             > or = 30 ng/mL   For 25-OH Vitamin D testing on patients on D2-supplementation and patients for whom quantitation of D2 and D3 fractions is required, the QuestAssureD 25-OH VIT D, (D2,D3), LC/MS/MS is recommended: order code [id number] (patients > 2 yrs).   Orders Only on 11/09/2015  Component Date Value Ref Range Status  . Sodium 11/09/2015 140  135 - 146 mmol/L Final  . Potassium 11/09/2015 3.8  3.5 - 5.3 mmol/L Final  . Chloride 11/09/2015 106  98 - 110 mmol/L Final  . CO2 11/09/2015 27  20 - 31 mmol/L Final  . Glucose, Bld 11/09/2015 92  65 - 99 mg/dL Final  . BUN 11/09/2015 21  7 - 25 mg/dL Final  . Creat 11/09/2015 0.97* 0.60 - 0.88 mg/dL Final   Comment:   For patients > or = 81 years of age: The upper reference limit for Creatinine is approximately 13% higher for people identified as African-American.     . Total Bilirubin 11/09/2015 0.7  0.2 - 1.2 mg/dL Final  . Alkaline Phosphatase 11/09/2015 55  33 - 130 U/L Final  . AST 11/09/2015 13  10 - 35 U/L Final  . ALT 11/09/2015 8  6 - 29 U/L Final  . Total Protein 11/09/2015 6.5  6.1 - 8.1 g/dL Final  . Albumin 11/09/2015 4.1  3.6 - 5.1 g/dL Final  . Calcium 11/09/2015 9.8  8.6 - 10.4 mg/dL Final  . GFR, Est African American 11/09/2015 63  >=60 mL/min Final  . GFR, Est Non African American 11/09/2015 55* >=60 mL/min Final  . WBC 11/09/2015 5.6  3.8 - 10.8 K/uL Final  . RBC 11/09/2015 4.22  3.80 - 5.10 MIL/uL Final  . Hemoglobin 11/09/2015 13.3  11.7 - 15.5 g/dL Final  . HCT 11/09/2015 39.4  35.0 - 45.0 % Final  . MCV 11/09/2015 93.4  80.0 - 100.0 fL Final  . MCH  11/09/2015 31.5  27.0 - 33.0 pg Final  . MCHC 11/09/2015 33.8  32.0 - 36.0 g/dL Final  . RDW 11/09/2015 13.5  11.0 - 15.0 % Final  . Platelets 11/09/2015 253  140 - 400 K/uL Final  . MPV 11/09/2015 8.7  7.5 - 12.5 fL Final  . Neutro Abs 11/09/2015 2912  1,500 - 7,800 cells/uL Final  . Lymphs Abs 11/09/2015 2184  850 - 3,900 cells/uL Final  . Monocytes Absolute 11/09/2015 336  200 - 950 cells/uL Final  . Eosinophils Absolute 11/09/2015 112  15 - 500 cells/uL Final  . Basophils Absolute 11/09/2015 56  0 - 200 cells/uL Final  . Neutrophils Relative % 11/09/2015 52  %  Final  . Lymphocytes Relative 11/09/2015 39  % Final  . Monocytes Relative 11/09/2015 6  % Final  . Eosinophils Relative 11/09/2015 2  % Final  . Basophils Relative 11/09/2015 1  % Final  . Smear Review 11/09/2015 Criteria for review not met   Final     Imaging: No results found.  Speciality Comments: No specialty comments available.    Procedures:  Large Joint Inj Date/Time: 04/23/2016 9:13 AM Performed by: Eliezer Lofts Authorized by: Eliezer Lofts   Consent Given by:  Patient Site marked: the procedure site was marked   Timeout: prior to procedure the correct patient, procedure, and site was verified   Indications:  Pain and joint swelling Location:  Knee Site:  R knee Prep: patient was prepped and draped in usual sterile fashion   Needle Size:  27 G Needle Length:  1.5 inches Approach:  Medial Ultrasound Guidance: No   Fluoroscopic Guidance: No   Arthrogram: No   Medications:  1.5 mL lidocaine 1 %; 40 mg triamcinolone acetonide 40 MG/ML Aspiration Attempted: Yes   Patient tolerance:  Patient tolerated the procedure well with no immediate complications   Allergies: Aspirin and Sudafed [pseudoephedrine hcl]   Assessment / Plan:     Visit Diagnoses: Rheumatoid arthritis with rheumatoid factor of multiple sites without organ or systems involvement ([place])  High risk medications (not anticoagulants)  long-term use - 04/23/2016: MTX 2 pills per week,//  folic acid 1 mg daily, // prednisone 5 mg every morning  Osteoporosis, unspecified osteoporosis type, unspecified pathological fracture presence - 04/23/2016: Monitored by PCP.//On alendronate   Plan: #1: Rheumatoid arthritis with rheumatoid factor positive. Patient is doing well with her methotrexate 2 pills per week, folic acid 1 mg daily, prednisone 5 mg daily. She states that recently she had for about couple of days swelling to the MCP joints bilaterally. She attributes that swelling to eating some cake that was made for her. She had to separate occasions of eating the cake. Later that evening on each of those days she had swelling in those joints. She uses sports cream and rubbed her hands really well and that allowed the swelling to go down. The symptoms included warmth stiffness and swelling. My differential diagnosis includes rheumatoid arthritis flare. Currently patient is on 2 pills of methotrexate per week but I suspect she might need to go up to 3 pills per week if she has this flare again. I've asked the patient to give me a call should this happen again. Patient understands and is agreeable is  #2: High risk prescription Methotrexate 2 pills once a week Folic acid 1 mg daily Prednisone 5 mg daily (Note patient may have had a flare about a week ago that affected bilateral MCP. The symptoms included stiffness redness warmth swelling and pain. I've advised the patient to call me so I can increase the methotrexate to 3 per week should this flare happen again.)  #3: Right knee joint pain and swelling. This also could be a flare and patient will benefit from a cortisone injection. I will give her cortisone injection today See procedure note for full details  #4: Return to clinic in May so we can recheck and make sure patient's flare is well controlled and if we need to make any adjustments we can do that at that visit. Note:  There is no synovitis on examination today. However, her right knee is swollen but I don't feel any true effusion.  Orders: No orders of the  defined types were placed in this encounter.  No orders of the defined types were placed in this encounter.   Face-to-face time spent with patient was 30 minutes. 50% of time was spent in counseling and coordination of care.  Follow-Up Instructions: Return in about 5 months (around 09/23/2016) for RA,+RF, MTX 2/wk, folic 1/d; pred 25m qAM,.   NEliezer Lofts PA-C  Note - This record has been created using [employer]  Chart creation errors have been sought, but may not always  have been located. Such creation errors do not reflect on  the standard of medical care.

## 2016-04-27 DIAGNOSIS — H35363 Drusen (degenerative) of macula, bilateral: Secondary | ICD-10-CM | POA: Diagnosis not present

## 2016-04-27 LAB — HM DIABETES EYE EXAM

## 2016-05-06 ENCOUNTER — Encounter: Payer: Self-pay | Admitting: Family Medicine

## 2016-05-06 ENCOUNTER — Ambulatory Visit (INDEPENDENT_AMBULATORY_CARE_PROVIDER_SITE_OTHER): Payer: Medicare Other | Admitting: Family Medicine

## 2016-05-06 ENCOUNTER — Ambulatory Visit (INDEPENDENT_AMBULATORY_CARE_PROVIDER_SITE_OTHER): Payer: Medicare Other

## 2016-05-06 VITALS — BP 132/72 | HR 74 | Resp 16 | Ht 62.0 in | Wt 133.0 lb

## 2016-05-06 VITALS — BP 132/72 | HR 74 | Temp 98.6°F | Ht 62.0 in | Wt 133.0 lb

## 2016-05-06 DIAGNOSIS — Z1239 Encounter for other screening for malignant neoplasm of breast: Secondary | ICD-10-CM

## 2016-05-06 DIAGNOSIS — E785 Hyperlipidemia, unspecified: Secondary | ICD-10-CM | POA: Diagnosis not present

## 2016-05-06 DIAGNOSIS — Z1231 Encounter for screening mammogram for malignant neoplasm of breast: Secondary | ICD-10-CM | POA: Diagnosis not present

## 2016-05-06 DIAGNOSIS — Z0000 Encounter for general adult medical examination without abnormal findings: Secondary | ICD-10-CM | POA: Diagnosis not present

## 2016-05-06 DIAGNOSIS — M0579 Rheumatoid arthritis with rheumatoid factor of multiple sites without organ or systems involvement: Secondary | ICD-10-CM

## 2016-05-06 DIAGNOSIS — R1909 Other intra-abdominal and pelvic swelling, mass and lump: Secondary | ICD-10-CM

## 2016-05-06 DIAGNOSIS — I10 Essential (primary) hypertension: Secondary | ICD-10-CM | POA: Diagnosis not present

## 2016-05-06 DIAGNOSIS — D259 Leiomyoma of uterus, unspecified: Secondary | ICD-10-CM

## 2016-05-06 DIAGNOSIS — E119 Type 2 diabetes mellitus without complications: Secondary | ICD-10-CM

## 2016-05-06 DIAGNOSIS — R7303 Prediabetes: Secondary | ICD-10-CM | POA: Diagnosis not present

## 2016-05-06 NOTE — Patient Instructions (Addendum)
f/u with rectal first week in October, call if you need   You are being referred for a scan of your abdomen and pelvis me before  Non fast hBA1C , chem 7 and EGFR 1 week before visit  It is important that you exercise regularly at least 30 minutes 5 times a week. If you develop chest pain, have severe difficulty breathing, or feel very tired, stop exercising immediately and seek medical attention    LABS are excellent  Watch eating , as in sweets and starchy foods to control blood sugar, eat a lot of vegetables,( measure corn and potato) and fruit is the source of sweet, drink water , aim for 60 ounces daily,  bUT by 7:30 at night   NO CLIMBING on chairs, no falls  Mammogram to be scheduled, will make at checkout  You are being referred for a scan of your abdomen and pelvis, we will call with that appointment

## 2016-05-06 NOTE — Progress Notes (Signed)
Subjective:   Stacy Meyers is a 83 y.o. female who presents for Medicare Annual (Subsequent) preventive examination.  Review of Systems:  Cardiac Risk Factors include: advanced age (>110men, >86 women);dyslipidemia;hypertension;sedentary lifestyle     Objective:     Vitals: BP 132/72   Pulse 74   Temp 98.6 F (37 C) (Oral)   Ht 5\' 2"  (1.575 m)   Wt 133 lb (60.3 kg)   SpO2 99%   BMI 24.33 kg/m   Body mass index is 24.33 kg/m.   Tobacco History  Smoking Status  . Never Smoker  Smokeless Tobacco  . Never Used    Comment: Second hand smoke      Counseling given: Not Answered   Past Medical History:  Diagnosis Date  . Acute cholecystitis 12/16/2010  . Anemia 12/14/2010  . ARF (acute renal failure) ([place]) 12/14/2010  . Bradycardia    Sinus - noted 6/11  . Cholelithiasis 12/14/2010  . Collagen vascular disease ([place])   . Diastolic dysfunction 34/19/3790   Grade 1 per 2-D echocardiogram  . Dysrhythmia   . Essential hypertension, benign   . HTN (hypertension) 12/14/2010  . Hypercalcemia 12/14/2010  . Hypokalemia 12/14/2010  . Mixed hyperlipidemia   . Osteoarthritis   . Rheumatoid arthritis(714.0)    Past Surgical History:  Procedure Laterality Date  . Bilateral cataract surgery  2009  . BUNIONECTOMY Right 2012  . CHOLECYSTECTOMY  12/17/2010   Procedure: LAPAROSCOPIC CHOLECYSTECTOMY;  Surgeon: Donato Heinz;  Location: [facility];  Service: General;  Laterality: N/A;  . COLONOSCOPY  05/22/2011   Procedure: COLONOSCOPY;  Surgeon: Rogene Houston, MD;  Location: [facility];  Service: Endoscopy;  Laterality: N/A;  1045  . EYE SURGERY Bilateral    cataract  . GANGLION CYST EXCISION Left 06/02/2012   Procedure: Excision ganglion left dorsal wrist;  Surgeon: Sanjuana Kava, MD;  Location: [facility];  Service: Orthopedics;  Laterality: Left;  . L2-L3 discectomy  2002  . L3-L4, L4-L5 lumbar decompression  2011  . Right shoulder cyst excision  2010  . SPINE SURGERY     twice,  approx 2006, 2011   Family History  Problem Relation Age of Onset  . Stroke Mother   . Cancer Father   . Diabetes Sister   . Diabetes Sister   . Kidney failure Brother   . Kidney failure Brother   . Pancreatic cancer Sister   . Kidney Stones Sister     Only one Kidney  . Arthritis Sister   . Heart disease Daughter    History  Sexual Activity  . Sexual activity: Not Currently    Outpatient Encounter Prescriptions as of 05/06/2016  Medication Sig  . acetaminophen (TYLENOL) 500 MG tablet Take 1,000 mg by mouth every 6 (six) hours as needed. Reported on 05/28/2015  . acyclovir (ZOVIRAX) 400 MG tablet TAKE 1 TABLET BY MOUTH TWICE A DAY  . alendronate (FOSAMAX) 70 MG tablet TAKE 1 TABLET BY MOUTH EVERY 7 DAYS. TAKE WITH A FULL GLASS OF WATER ON AN EMPTY STOMACH.  Marland Kitchen amLODipine (NORVASC) 2.5 MG tablet TAKE 1 TABLET BY MOUTH EVERY DAY  . azelastine (ASTELIN) 0.1 % nasal spray Place 2 sprays into both nostrils 2 (two) times daily. Use in each nostril as directed (Patient taking differently: Place 2 sprays into both nostrils 2 (two) times daily as needed. Use in each nostril as directed)  . diltiazem (CARDIZEM CD) 120 MG 24 hr capsule TAKE 1 CAPSULE (120 MG TOTAL) BY  MOUTH DAILY.  . ergocalciferol (VITAMIN D2) 50000 units capsule Take 1 capsule (50,000 Units total) by mouth once a week. One capsule once weekly  . folic acid (FOLVITE) 1 MG tablet Take 1 tablet (1 mg total) by mouth daily.  Marland Kitchen KLOR-CON M20 20 MEQ tablet TAKE 1 TABLET (20 MEQ TOTAL) BY MOUTH DAILY.  . methotrexate (RHEUMATREX) 2.5 MG tablet TAKE 4 TABLETS (10 MG TOTAL) BY MOUTH ONCE A WEEK. CAUTION:CHEMOTHERAPY. PROTECT FROM LIGHT. (Patient taking differently: Take 5 mg by mouth once a week. Caution:Chemotherapy. Protect from light. )  . montelukast (SINGULAIR) 10 MG tablet TAKE 1 TABLET (10 MG TOTAL) BY MOUTH AT BEDTIME. (Patient taking differently: Take 10 mg by mouth 2 (two) times daily as needed. )  . predniSONE (DELTASONE) 5 MG  tablet Take 1 tablet (5 mg total) by mouth daily.  . rosuvastatin (CRESTOR) 5 MG tablet One tablet every other day  . [DISCONTINUED] calcium-vitamin D (OSCAL 500/200 D-3) 500-200 MG-UNIT per tablet Take 1 tablet by mouth 2 (two) times daily.  . [DISCONTINUED] diltiazem (TIAZAC) 120 MG 24 hr capsule Take 120 mg by mouth daily.   No facility-administered encounter medications on file as of 05/06/2016.     Activities of Daily Living In your present state of health, do you have any difficulty performing the following activities: 05/06/2016  Hearing? N  Vision? Y  Difficulty concentrating or making decisions? Y  Walking or climbing stairs? Y  Dressing or bathing? N  Doing errands, shopping? N  Preparing Food and eating ? N  Using the Toilet? N  In the past six months, have you accidently leaked urine? N  Do you have problems with loss of bowel control? N  Managing your Medications? N  Managing your Finances? N  Housekeeping or managing your Housekeeping? N  Some recent data might be hidden    Patient Care Team: Fayrene Helper, MD as PCP - General Estanislado Emms, MD as Consulting Physician (Nephrology) Bo Merino, MD as Consulting Physician (Rheumatology)    Assessment:    Exercise Activities and Dietary recommendations Current Exercise Habits: The patient does not participate in regular exercise at present, Exercise limited by: None identified  Goals    . Exercise 3x per week (30 min per time)          Recommend starting a routine exercise program at least 3 days a week for 30-45 minutes at a time as tolerated.        Fall Risk Fall Risk  05/06/2016 02/04/2015 09/03/2014 07/27/2012 07/27/2012  Falls in the past year? Yes No No Yes No  Number falls in past yr: 1 - - 1 -  Injury with Fall? No - - - -  Risk for fall due to : - - - History of fall(s);Impaired balance/gait;Impaired mobility Impaired balance/gait;Impaired mobility  Follow up Education provided;Falls prevention  discussed - - - -   Depression Screen PHQ 2/9 Scores 05/06/2016 02/04/2015 09/03/2014 08/03/2013  PHQ - 2 Score 0 0 0 0  PHQ- 9 Score - - - 2     Cognitive Function     6CIT Screen 05/06/2016  What Year? 0 points  What month? 0 points  What time? 0 points  Count back from 20 0 points  Months in reverse 0 points  Repeat phrase 0 points  Total Score 0    Immunization History  Administered Date(s) Administered  . Influenza Split 11/07/2013  . Influenza Whole 10/28/2009, 09/17/2010  . Influenza,inj,Quad PF,36+ Mos  09/03/2014  . Influenza-Unspecified 10/05/2013  . PPD Test 05/14/2014, 05/28/2015  . Pneumococcal Conjugate-13 08/03/2013  . Pneumococcal Polysaccharide-23 08/28/2009  . Tdap 04/29/2010   Screening Tests Health Maintenance  Topic Date Due  . INFLUENZA VACCINE  08/05/2016  . TETANUS/TDAP  04/28/2020  . DEXA SCAN  Completed  . PNA vac Low Risk Adult  Completed      Plan:    I have personally reviewed and noted the following in the patient's chart:   . Medical and social history . Use of alcohol, tobacco or illicit drugs  . Current medications and supplements . Functional ability and status . Nutritional status . Physical activity . Advanced directives . List of other physicians . Hospitalizations, surgeries, and ER visits in previous 12 months . Vitals . Screenings to include cognitive, depression, and falls . Referrals and appointments  In addition, I have reviewed and discussed with patient certain preventive protocols, quality metrics, and best practice recommendations. A written personalized care plan for preventive services as well as general preventive health recommendations were provided to patient.     Stormy Fabian, LPN  0/01/402

## 2016-05-06 NOTE — Patient Instructions (Addendum)
[name] , Thank you for taking time to come for your Medicare Wellness Visit. I appreciate your ongoing commitment to your health goals. Please review the following plan we discussed and let me know if I can assist you in the future.   Screening recommendations/referrals: Colonoscopy: No longer required Mammogram: Up to date, next due this month if you desire. This is no longer required Bone Density: up to date Recommended yearly ophthalmology/optometry visit for glaucoma screening and checkup Recommended yearly dental visit for hygiene and checkup  Vaccinations: Influenza vaccine: Up to date, next due 08/2016 Pneumococcal vaccine: Up to date Tdap vaccine: Up to date, next due 04/2020 Shingles vaccine: Due, declines     Advanced directives: Please bring a copy of your POA (Power of Attorney) and/or Living Will to your next appointment.   Next appointment: Follow up in 1 year for your annual wellness visit.  Preventive Care 81 Years and Older, Female Preventive care refers to lifestyle choices and visits with your health care provider that can promote health and wellness. What does preventive care include?  A yearly physical exam. This is also called an annual well check.  Dental exams once or twice a year.  Routine eye exams. Ask your health care provider how often you should have your eyes checked.  Personal lifestyle choices, including:  Daily care of your teeth and gums.  Regular physical activity.  Eating a healthy diet.  Avoiding tobacco and drug use.  Limiting alcohol use.  Practicing safe sex.  Taking low-dose aspirin every day.  Taking vitamin and mineral supplements as recommended by your health care provider. What happens during an annual well check? The services and screenings done by your health care provider during your annual well check will depend on your age, overall health, lifestyle risk factors, and family history of disease. Counseling  Your health  care provider may ask you questions about your:  Alcohol use.  Tobacco use.  Drug use.  Emotional well-being.  Home and relationship well-being.  Sexual activity.  Eating habits.  History of falls.  Memory and ability to understand (cognition).  Work and work Statistician.  Reproductive health. Screening  You may have the following tests or measurements:  Height, weight, and BMI.  Blood pressure.  Lipid and cholesterol levels. These may be checked every 5 years, or more frequently if you are over 73 years old.  Skin check.  Lung cancer screening. You may have this screening every year starting at age 81 if you have a 30-pack-year history of smoking and currently smoke or have quit within the past 15 years.  Fecal occult blood test (FOBT) of the stool. You may have this test every year starting at age 81.  Flexible sigmoidoscopy or colonoscopy. You may have a sigmoidoscopy every 5 years or a colonoscopy every 10 years starting at age 81.  Hepatitis C blood test.  Hepatitis B blood test.  Sexually transmitted disease (STD) testing.  Diabetes screening. This is done by checking your blood sugar (glucose) after you have not eaten for a while (fasting). You may have this done every 1-3 years.  Bone density scan. This is done to screen for osteoporosis. You may have this done starting at age 31.  Mammogram. This may be done every 1-2 years. Talk to your health care provider about how often you should have regular mammograms. Talk with your health care provider about your test results, treatment options, and if necessary, the need for more tests. Vaccines  Your health care provider may recommend certain vaccines, such as:  Influenza vaccine. This is recommended every year.  Tetanus, diphtheria, and acellular pertussis (Tdap, Td) vaccine. You may need a Td booster every 10 years.  Zoster vaccine. You may need this after age 78.  Pneumococcal 13-valent conjugate  (PCV13) vaccine. One dose is recommended after age 38.  Pneumococcal polysaccharide (PPSV23) vaccine. One dose is recommended after age 75. Talk to your health care provider about which screenings and vaccines you need and how often you need them. This information is not intended to replace advice given to you by your health care provider. Make sure you discuss any questions you have with your health care provider. Document Released: 01/18/2015 Document Revised: 09/11/2015 Document Reviewed: 10/23/2014 Elsevier Interactive Patient Education  2017 [place] Prevention in the Home Falls can cause injuries. They can happen to people of all ages. There are many things you can do to make your home safe and to help prevent falls. What can I do on the outside of my home?  Regularly fix the edges of walkways and driveways and fix any cracks.  Remove anything that might make you trip as you walk through a door, such as a raised step or threshold.  Trim any bushes or trees on the path to your home.  Use bright outdoor lighting.  Clear any walking paths of anything that might make someone trip, such as rocks or tools.  Regularly check to see if handrails are loose or broken. Make sure that both sides of any steps have handrails.  Any raised decks and porches should have guardrails on the edges.  Have any leaves, snow, or ice cleared regularly.  Use sand or salt on walking paths during winter.  Clean up any spills in your garage right away. This includes oil or grease spills. What can I do in the bathroom?  Use night lights.  Install grab bars by the toilet and in the tub and shower. Do not use towel bars as grab bars.  Use non-skid mats or decals in the tub or shower.  If you need to sit down in the shower, use a plastic, non-slip stool.  Keep the floor dry. Clean up any water that spills on the floor as soon as it happens.  Remove soap buildup in the tub or shower  regularly.  Attach bath mats securely with double-sided non-slip rug tape.  Do not have throw rugs and other things on the floor that can make you trip. What can I do in the bedroom?  Use night lights.  Make sure that you have a light by your bed that is easy to reach.  Do not use any sheets or blankets that are too big for your bed. They should not hang down onto the floor.  Have a firm chair that has side arms. You can use this for support while you get dressed.  Do not have throw rugs and other things on the floor that can make you trip. What can I do in the kitchen?  Clean up any spills right away.  Avoid walking on wet floors.  Keep items that you use a lot in easy-to-reach places.  If you need to reach something above you, use a strong step stool that has a grab bar.  Keep electrical cords out of the way.  Do not use floor polish or wax that makes floors slippery. If you must use wax, use non-skid floor wax.  Do  not have throw rugs and other things on the floor that can make you trip. What can I do with my stairs?  Do not leave any items on the stairs.  Make sure that there are handrails on both sides of the stairs and use them. Fix handrails that are broken or loose. Make sure that handrails are as long as the stairways.  Check any carpeting to make sure that it is firmly attached to the stairs. Fix any carpet that is loose or worn.  Avoid having throw rugs at the top or bottom of the stairs. If you do have throw rugs, attach them to the floor with carpet tape.  Make sure that you have a light switch at the top of the stairs and the bottom of the stairs. If you do not have them, ask someone to add them for you. What else can I do to help prevent falls?  Wear shoes that:  Do not have high heels.  Have rubber bottoms.  Are comfortable and fit you well.  Are closed at the toe. Do not wear sandals.  If you use a stepladder:  Make sure that it is fully  opened. Do not climb a closed stepladder.  Make sure that both sides of the stepladder are locked into place.  Ask someone to hold it for you, if possible.  Clearly mark and make sure that you can see:  Any grab bars or handrails.  First and last steps.  Where the edge of each step is.  Use tools that help you move around (mobility aids) if they are needed. These include:  Canes.  Walkers.  Scooters.  Crutches.  Turn on the lights when you go into a dark area. Replace any light bulbs as soon as they burn out.  Set up your furniture so you have a clear path. Avoid moving your furniture around.  If any of your floors are uneven, fix them.  If there are any pets around you, be aware of where they are.  Review your medicines with your doctor. Some medicines can make you feel dizzy. This can increase your chance of falling. Ask your doctor what other things that you can do to help prevent falls. This information is not intended to replace advice given to you by your health care provider. Make sure you discuss any questions you have with your health care provider. Document Released: 10/18/2008 Document Revised: 05/30/2015 Document Reviewed: 01/26/2014 Elsevier Interactive Patient Education  2017 [employer].

## 2016-05-08 ENCOUNTER — Ambulatory Visit (HOSPITAL_COMMUNITY)
Admission: RE | Admit: 2016-05-08 | Discharge: 2016-05-08 | Disposition: A | Discharge status: Home / Self Care | Payer: Medicare Other | Source: Ambulatory Visit | Attending: Family Medicine | Admitting: Family Medicine

## 2016-05-08 DIAGNOSIS — Z1231 Encounter for screening mammogram for malignant neoplasm of breast: Secondary | ICD-10-CM | POA: Diagnosis not present

## 2016-05-08 DIAGNOSIS — R1900 Intra-abdominal and pelvic swelling, mass and lump, unspecified site: Secondary | ICD-10-CM | POA: Insufficient documentation

## 2016-05-08 DIAGNOSIS — Z1239 Encounter for other screening for malignant neoplasm of breast: Secondary | ICD-10-CM

## 2016-05-08 DIAGNOSIS — D219 Benign neoplasm of connective and other soft tissue, unspecified: Secondary | ICD-10-CM | POA: Insufficient documentation

## 2016-05-08 NOTE — Assessment & Plan Note (Signed)
Well managed by rheumatology with improved level of function on disease modifying agents

## 2016-05-08 NOTE — Assessment & Plan Note (Signed)
Controlled, no change in medication DASH diet and commitment to daily physical activity for a minimum of 30 minutes discussed and encouraged, as a part of hypertension management. The importance of attaining a healthy weight is also discussed.  BP/Weight 05/06/2016 05/06/2016 04/23/2016 12/25/2015 12/05/2015 11/26/2015 8/93/8101  Systolic BP 751 025 852 778 242 353 614  Diastolic BP 72 72 62 80 64 64 77  Wt. (Lbs) 133 133 119 137 137 140.04 140  BMI 24.33 24.33 21.77 27.67 28.63 29.27 29.27

## 2016-05-08 NOTE — Assessment & Plan Note (Signed)
Deteriorated Stacy Meyers is reminded of the importance of commitment to daily physical activity for 30 minutes or more, as able and the need to limit carbohydrate intake to 30 to 60 grams per meal to help with blood sugar control.   The need to take medication as prescribed, test blood sugar as directed, and to call between visits if there is a concern that blood sugar is uncontrolled is also discussed.   Stacy Meyers is reminded of the importance of daily foot exam, annual eye examination, and good blood sugar, blood pressure and cholesterol control.  Diabetic Labs Latest Ref Rng & Units 03/12/2016 02/24/2016 11/09/2015 05/24/2015 01/24/2015  HbA1c <5.7 % - 6.8(H) 6.4(H) - 6.6(H)  Chol <200 mg/dL - 171 - 150 125  HDL >50 mg/dL - 63 - 57 52  Calc LDL <100 mg/dL - 82 - 75 51  Triglycerides <150 mg/dL - 130 - 92 111  Creatinine 0.60 - 0.88 mg/dL 0.90(H) 0.97(H) 0.97(H) - 1.08(H)   BP/Weight 05/06/2016 05/06/2016 04/23/2016 12/25/2015 12/05/2015 11/26/2015 09/27/3005  Systolic BP 622 633 [phone] 734  Diastolic BP 72 72 62 80 64 64 77  Wt. (Lbs) 133 133 119 137 137 140.04 140  BMI 24.33 24.33 21.77 27.67 28.63 29.27 29.27   No flowsheet data found.

## 2016-05-08 NOTE — Progress Notes (Signed)
Stacy Meyers     MRN: [id number]      DOB: 1932/05/28   HPI Stacy Meyers is here for follow up and re-evaluation of chronic medical conditions, medication management and review of any available recent lab and radiology data.  Preventive health is updated, specifically  Cancer screening and Immunization.    The PT denies any adverse reactions to current medications since the last visit.  c/o large stomach, states always has to wear a girdle to hold it in but seems to be getting larger, record review establishes multiple fibroids and recommends follow up  Imaging   Coping with recent loss of her spouse ROS Denies recent fever or chills. Denies sinus pressure, nasal congestion, ear pain or sore throat. Denies chest congestion, productive cough or wheezing. Denies chest pains, palpitations and leg swelling Denies  nausea, vomiting,diarrhea or constipation.   Denies dysuria, frequency, hesitancy or incontinence. Denies uncontrolled  joint pain, swelling and limitation in mobility. Denies headaches, seizures, numbness, or tingling. Denies depression, anxiety or insomnia. Denies skin break down or rash.   PE  BP 132/72   Pulse 74   Resp 16   Ht 5\' 2"  (1.575 m)   Wt 133 lb (60.3 kg)   SpO2 99%   BMI 24.33 kg/m   Patient alert and oriented and in no cardiopulmonary distress.  HEENT: No facial asymmetry, EOMI,   oropharynx pink and moist.  Neck supple no JVD, no mass.  Chest: Clear to auscultation bilaterally.  CVS: S1, S2 no murmurs, no S3.Regular rate.  ABD: Soft, protuberant, lower abdominal mass arising from pelvis   Ext: No edema  MS: Adequate though reduced  ROM spine, shoulders, hips and knees.  Skin: Intact, no ulcerations or rash noted.  Psych: Good eye contact, normal affect. Memory intact not anxious or depressed appearing.  CNS: CN 2-12 intact, power,  normal throughout.no focal deficits noted.   Assessment & Plan  Essential hypertension, benign Controlled,  no change in medication DASH diet and commitment to daily physical activity for a minimum of 30 minutes discussed and encouraged, as a part of hypertension management. The importance of attaining a healthy weight is also discussed.  BP/Weight 05/06/2016 05/06/2016 04/23/2016 12/25/2015 12/05/2015 11/26/2015 5/78/4696  Systolic BP 295 284 132 440 102 725 366  Diastolic BP 72 72 62 80 64 64 77  Wt. (Lbs) 133 133 119 137 137 140.04 140  BMI 24.33 24.33 21.77 27.67 28.63 29.27 29.27       Hyperlipidemia LDL goal <100 Hyperlipidemia:Low fat diet discussed and encouraged.   Lipid Panel  Lab Results  Component Value Date   CHOL 171 02/24/2016   HDL 63 02/24/2016   LDLCALC 82 02/24/2016   TRIG 130 02/24/2016   CHOLHDL 2.7 02/24/2016   Controlled, no change in medication    Rheumatoid arthritis with rheumatoid factor of multiple sites without organ or systems involvement ([place]) Well managed by rheumatology with improved level of function on disease modifying agents  Type 2 diabetes, diet controlled ([place]) Deteriorated Stacy Meyers is reminded of the importance of commitment to daily physical activity for 30 minutes or more, as able and the need to limit carbohydrate intake to 30 to 60 grams per meal to help with blood sugar control.   The need to take medication as prescribed, test blood sugar as directed, and to call between visits if there is a concern that blood sugar is uncontrolled is also discussed.   Stacy Meyers is reminded of the  importance of daily foot exam, annual eye examination, and good blood sugar, blood pressure and cholesterol control.  Diabetic Labs Latest Ref Rng & Units 03/12/2016 02/24/2016 11/09/2015 05/24/2015 01/24/2015  HbA1c <5.7 % - 6.8(H) 6.4(H) - 6.6(H)  Chol <200 mg/dL - 171 - 150 125  HDL >50 mg/dL - 63 - 57 52  Calc LDL <100 mg/dL - 82 - 75 51  Triglycerides <150 mg/dL - 130 - 92 111  Creatinine 0.60 - 0.88 mg/dL 0.90(H) 0.97(H) 0.97(H) - 1.08(H)   BP/Weight  05/06/2016 05/06/2016 04/23/2016 12/25/2015 12/05/2015 11/26/2015 5/42/7062  Systolic BP 376 283 151 761 607 371 062  Diastolic BP 72 72 62 80 64 64 77  Wt. (Lbs) 133 133 119 137 137 140.04 140  BMI 24.33 24.33 21.77 27.67 28.63 29.27 29.27   No flowsheet data found.      Fibroids c/o enlarged abdomen needing to wear a girdle all te time has multiple fibroids in remote past, needs re imaging due to complaint

## 2016-05-08 NOTE — Assessment & Plan Note (Signed)
Hyperlipidemia:Low fat diet discussed and encouraged.   Lipid Panel  Lab Results  Component Value Date   CHOL 171 02/24/2016   HDL 63 02/24/2016   LDLCALC 82 02/24/2016   TRIG 130 02/24/2016   CHOLHDL 2.7 02/24/2016   Controlled, no change in medication

## 2016-05-08 NOTE — Assessment & Plan Note (Signed)
c/o enlarged abdomen needing to wear a girdle all te time has multiple fibroids in remote past, needs re imaging due to complaint

## 2016-05-22 ENCOUNTER — Other Ambulatory Visit: Payer: Self-pay | Admitting: Family Medicine

## 2016-05-22 NOTE — Progress Notes (Signed)
Office Visit Note  Patient: Stacy Meyers             Date of Birth: Apr 27, 1932           MRN: [id number]             PCP: Fayrene Helper, MD Referring: Fayrene Helper, MD Visit Date: 06/04/2016 Occupation: @GUAROCC @    Subjective:  Rheumatoid Arthritis (Follow up, doing good) Here for follow-up on right knee pain  History of Present Illness: Stacy Meyers is a 83 y.o. female   Presents today for follow-up for right knee pain and swelling. We gave her cortisone injection to the right knee on April 2018 visit. Unfortunately, patient states that she did well and then she got worse again lately. She feels that she may improve with another cortisone injection to the right knee. The left knee is doing very well.  Her hands and wrist are doing well. She does not have any pain in neck stiffness or swelling.  In the past she was well controlled with her RA for methotrexate 2 pills per week and folic acid 1 mg daily.  No other problem except patient is under a fair amount of stress secondary to losing her husband in early April 2018.  Activities of Daily Living:  Patient reports morning stiffness for 30 minute.   Patient Reports nocturnal pain.  Difficulty dressing/grooming: Denies Difficulty climbing stairs: Reports Difficulty getting out of chair: Reports Difficulty using hands for taps, buttons, cutlery, and/or writing: Reports   Review of Systems  Constitutional: Negative for fatigue.  HENT: Negative for mouth sores and mouth dryness.   Eyes: Negative for dryness.  Respiratory: Negative for shortness of breath.   Gastrointestinal: Negative for constipation and diarrhea.  Musculoskeletal: Negative for myalgias and myalgias.  Skin: Negative for sensitivity to sunlight.  Psychiatric/Behavioral: Negative for decreased concentration and sleep disturbance.    PMFS History:  Patient Active Problem List   Diagnosis Date Noted  . Fibroids 05/08/2016  . Abdominal mass  05/08/2016  . Spondylosis of lumbar region without myelopathy or radiculopathy 12/24/2015  . Solitary bone cyst of right shoulder 05/28/2015  . Right knee pain 05/28/2015  . Osteoarthritis of both shoulders 05/28/2015  . Nocturia 12/04/2013  . Ganglion cyst of wrist 05/11/2012  . Systolic murmur 93/71/6967  . Diastolic dysfunction 89/38/1017  . Seasonal allergies 08/28/2010  . Type 2 diabetes, diet controlled ([place]) 07/25/2009  . Herpes simplex virus (HSV) infection 09/16/2008  . Rheumatoid arthritis with rheumatoid factor of multiple sites without organ or systems involvement ([place]) 09/16/2008  . Hyperlipidemia LDL goal <100 05/23/2008  . Essential hypertension, benign 05/23/2008    Past Medical History:  Diagnosis Date  . Acute cholecystitis 12/16/2010  . Anemia 12/14/2010  . ARF (acute renal failure) ([place]) 12/14/2010  . Bradycardia    Sinus - noted 6/11  . Cholelithiasis 12/14/2010  . Collagen vascular disease ([place])   . Diastolic dysfunction 51/02/5850   Grade 1 per 2-D echocardiogram  . Dysrhythmia   . Essential hypertension, benign   . HTN (hypertension) 12/14/2010  . Hypercalcemia 12/14/2010  . Hypokalemia 12/14/2010  . Mixed hyperlipidemia   . Osteoarthritis   . Rheumatoid arthritis(714.0)     Family History  Problem Relation Age of Onset  . Stroke Mother   . Cancer Father   . Diabetes Sister   . Diabetes Sister   . Kidney failure Brother   . Kidney failure Brother   . Pancreatic cancer Sister   .  Kidney Stones Sister        Only one Kidney  . Arthritis Sister   . Heart disease Daughter    Past Surgical History:  Procedure Laterality Date  . Bilateral cataract surgery  2009  . BUNIONECTOMY Right 2012  . CHOLECYSTECTOMY  12/17/2010   Procedure: LAPAROSCOPIC CHOLECYSTECTOMY;  Surgeon: Donato Heinz;  Location: [facility];  Service: General;  Laterality: N/A;  . COLONOSCOPY  05/22/2011   Procedure: COLONOSCOPY;  Surgeon: Rogene Houston, MD;  Location: [facility];   Service: Endoscopy;  Laterality: N/A;  1045  . EYE SURGERY Bilateral    cataract  . GANGLION CYST EXCISION Left 06/02/2012   Procedure: Excision ganglion left dorsal wrist;  Surgeon: Sanjuana Kava, MD;  Location: [facility];  Service: Orthopedics;  Laterality: Left;  . L2-L3 discectomy  2002  . L3-L4, L4-L5 lumbar decompression  2011  . Right shoulder cyst excision  2010  . SPINE SURGERY     twice, approx 2006, 2011   Social History   Social History Narrative  . No narrative on file     Objective: Vital Signs: BP (!) 128/50 (BP Location: Left Arm, Patient Position: Sitting, Cuff Size: Normal)   Pulse 63   Resp 16   Ht 5\' 2"  (1.575 m)   Wt 134 lb (60.8 kg)   BMI 24.51 kg/m    Physical Exam  Constitutional: She is oriented to person, place, and time. She appears well-developed and well-nourished.  HENT:  Head: Normocephalic and atraumatic.  Eyes: EOM are normal. Pupils are equal, round, and reactive to light.  Cardiovascular: Normal rate, regular rhythm and normal heart sounds.  Exam reveals no gallop and no friction rub.   No murmur heard. Pulmonary/Chest: Effort normal and breath sounds normal. She has no wheezes. She has no rales.  Abdominal: Soft. Bowel sounds are normal. She exhibits no distension. There is no tenderness. There is no guarding. No hernia.  Musculoskeletal: Normal range of motion. She exhibits no edema, tenderness or deformity.  Lymphadenopathy:    She has no cervical adenopathy.  Neurological: She is alert and oriented to person, place, and time. Coordination normal.  Skin: Skin is warm and dry. Capillary refill takes less than 2 seconds. No rash noted.  Psychiatric: She has a normal mood and affect. Her behavior is normal.  Nursing note and vitals reviewed.    Musculoskeletal Exam:  Full range of motion of all joints Grip strength is equal and strong bilaterally Fibroma also tender points are all absent  CDAI Exam: CDAI Homunculus Exam:   Joint  Counts:  CDAI Tender Joint count: 0 CDAI Swollen Joint count: 0  Global Assessments:  Patient Global Assessment: 5 Provider Global Assessment: 5  CDAI Calculated Score: 10 No synovitis on examination Right knee shows no warmth or effusion Today's right knee is much better than what it was on April 2018 visit She does have severe osteoarthritis of both knees with the right knee hurting and she is not eligible for surgery. I believe there is a overlap of our AN Alway in the right knee. Today the RA is much better in the right knee but she may have it flared up again and for that reason I'm increasing her medication to 3 pills per week and set of 2 pills per week that she is currently taking.   Investigation: No additional findings. CBC Latest Ref Rng & Units 03/12/2016 11/09/2015 12/04/2013  WBC 3.8 - 10.8 K/uL 6.8 5.6 7.5  Hemoglobin 11.7 - 15.5 g/dL 12.9 13.3 12.8  Hematocrit 35.0 - 45.0 % 39.3 39.4 38.2  Platelets 140 - 400 K/uL 242 253 239    CMP Latest Ref Rng & Units 05/25/2016 03/12/2016 02/24/2016  Glucose 65 - 99 mg/dL - 93 123(H)  BUN 7 - 25 mg/dL - 22 24  Creatinine 0.44 - 1.00 mg/dL 1.10(H) 0.90(H) 0.97(H)  Sodium 135 - 146 mmol/L - 143 142  Potassium 3.5 - 5.3 mmol/L - 4.1 3.8  Chloride 98 - 110 mmol/L - 107 107  CO2 20 - 31 mmol/L - 24 30  Calcium 8.6 - 10.4 mg/dL - 9.9 9.9  Total Protein 6.1 - 8.1 g/dL - 6.5 6.4  Total Bilirubin 0.2 - 1.2 mg/dL - 0.4 0.7  Alkaline Phos 33 - 130 U/L - 56 53  AST 10 - 35 U/L - 12 13  ALT 6 - 29 U/L - 6 10    Imaging: Ct Abdomen Pelvis W Contrast  Result Date: 05/25/2016 CLINICAL DATA:  Abdominal swelling and mass. Symptoms for more than 1 year. Known fibroids. EXAM: CT ABDOMEN AND PELVIS WITH CONTRAST TECHNIQUE: Multidetector CT imaging of the abdomen and pelvis was performed using the standard protocol following bolus administration of intravenous contrast. CONTRAST:  174mL ISOVUE-300 IV COMPARISON:  05/10/2014 abdominal ultrasound.  FINDINGS: Lower chest: Diffuse coronary atherosclerotic calcification. No acute finding Hepatobiliary: Simple left hepatic cyst measuring up to 2 cm. No evidence of solid mass.Cholecystectomy. Normal common bile duct diameter. Pancreas: Unremarkable. Spleen: Unremarkable. Adrenals/Urinary Tract: Negative adrenals. Bilateral renal atrophy with mild cortical lobulation. Bilateral nephrolithiasis, greater on the right where there are 3 calculi measuring up to 8 mm in the lower pole. Bilateral simple appearing renal cysts. At least partially duplicated bilateral urinary collecting system. Unremarkable bladder. Stomach/Bowel: No obstruction or inflammation. Diffuse colonic diverticulosis. Negative appendix. Incidental pedunculated lipoma in the proximal colon measuring up to 17 mm in diameter Vascular/Lymphatic: No acute vascular abnormality. Aortoiliac atherosclerosis. No mass or adenopathy. Reproductive:Partially calcified uterine fibroid measuring up to 6 cm. This would not explain abdominal distention. Negative adnexae. Other: No ascites or pneumoperitoneum. Fatty umbilical hernia with broad neck. Musculoskeletal: No acute or aggressive finding. Severe lumbar disc and facet degeneration with grade 1 anterolisthesis at L3-4 and grade 2 anterolisthesis at L4-5, facet mediated. Biforaminal impingement from L3-4 to L5-S1, particularly severe at L4-5 where disc compresses the L4 nerves. Diffuse lower lumbar spinal stenosis despite prior laminectomy at L4. IMPRESSION: 1. No mass or ascites to explain abdominal distention. 2. Moderate fatty umbilical hernia. 3. Colonic diverticulosis, bilateral nephrolithiasis, and 6 cm uterine fibroid. 4. Severe lumbar spine degeneration as described. 5. Aortic Atherosclerosis (ICD10-I70.0). Diffuse coronary calcification. Electronically Signed   By: Monte Fantasia M.D.   On: 05/25/2016 11:45   Mm Screening Breast Tomo Bilateral  Result Date: 05/08/2016 CLINICAL DATA:  Screening. EXAM:  2D DIGITAL SCREENING BILATERAL MAMMOGRAM WITH CAD AND ADJUNCT TOMO COMPARISON:  Previous exam(s). ACR Breast Density Category b: There are scattered areas of fibroglandular density. FINDINGS: There are no findings suspicious for malignancy. Images were processed with CAD. IMPRESSION: No mammographic evidence of malignancy. A result letter of this screening mammogram will be mailed directly to the patient. RECOMMENDATION: Screening mammogram in one year. (Code:SM-B-01Y) BI-RADS CATEGORY  1: Negative. Electronically Signed   By: Lajean Manes M.D.   On: 05/08/2016 14:24    Speciality Comments: No specialty comments available.    Procedures:  No procedures performed Allergies: Aspirin and Sudafed [pseudoephedrine hcl]  Assessment / Plan:     Visit Diagnoses: Rheumatoid arthritis with rheumatoid factor of multiple sites without organ or systems involvement (HCC) - Positive RF, positive anti-CCP, erosive disease with contractures  High risk medication use - MTX 5 mg po q week, Folic acid 1mg  po qd, Prednisone 5 mg po qd( MTX higher dose increased Cr) - Plan: CBC with Differential/Platelet, COMPLETE METABOLIC PANEL WITH GFR, CBC with Differential/Platelet, COMPLETE METABOLIC PANEL WITH GFR  Primary osteoarthritis of both shoulders  Rheumatoid arthritis involving both hands  Rheumatoid arthritis involving both knees  Rheumatoid arthritis involving both feet  Spondylosis of lumbar region  History of hypertension  History of diabetes mellitus  Age-related osteoporosis without current pathological fracture - Fosamax , Vit D per PCP   Plan: #1: Rheumatoid arthritis. Well-controlled in her hands and other joints except the right knee may not be well controlled. Currently she is taking methotrexate 2 pills every week. In the past it was adequately controlling her but lately, since April 2018, she has had right knee pain and swelling for which we gave her cortisone injection on  04/22/2016. Today, she states that she may need another injection in her right knee.  #2: High risk prescription Methotrexate 2 pills per week Folic acid 1 mg per day  #3: OA of the hands DIP PIP prominence; no synovitis  #4: Right knee osteoarthritis greater than left knee osteoarthritis. Causing pain. Offered patient brace. Patient already has an Ace wrap which she will use for the right knee. If the Ace wrap becomes uncomfortable or aggravating because he keeps sliding off, she has the option of buying over-the-counter sleep brace. I will give her prescription for Shields brace in case that proves to be more beneficial to the patient. It is up to the patient which one she uses in which when she finds the most comfortable.  #5: CBC with differential CMP with GFR today. She will increase her methotrexate 3 per week She'll return in about 6-8 weeks so we can check her and see how she is doing and redraw her blood at that time. We are worried about her kidney function and how the methotrexate effects the kidney function. In the past we have decreased her methotrexate because we were worried that the elevated creatinine was coming from use of methotrexate. Unfortunately since her right knee is having an overlap of RA and OA, she may benefit from taking 3 methotrexate per week.  Orders: Orders Placed This Encounter  Procedures  . CBC with Differential/Platelet  . COMPLETE METABOLIC PANEL WITH GFR   Meds ordered this encounter  Medications  . methotrexate (RHEUMATREX) 2.5 MG tablet    Sig: Take 3 tablets (7.5 mg total) by mouth once a week. Caution:Chemotherapy. Protect from light.    Dispense:  36 tablet    Refill:  0    Order Specific Question:   Supervising Provider    Answer:   Bo Merino [phone]    Face-to-face time spent with patient was 30 minutes. 50% of time was spent in counseling and coordination of care.  Follow-Up Instructions: Return in about 6 weeks (around  07/16/2016) for RA, RA & OA right knee, mtx --> 3/wk now; recheck kidney in 6 wks.   Essie Gehret Carlyon Shadow, PA-C  She had warmth on palpation of her right knee joint. We discussed use of any brace. We'll continue current treatment for right now. I examined and evaluated the patient with Eliezer Lofts PA. The plan of care was discussed as  noted above.  Bo Merino, MDNote - This record has been created using [employer].  Chart creation errors have been sought, but may not always  have been located. Such creation errors do not reflect on  the standard of medical care.

## 2016-05-25 ENCOUNTER — Ambulatory Visit (HOSPITAL_COMMUNITY)
Admission: RE | Admit: 2016-05-25 | Discharge: 2016-05-25 | Disposition: A | Discharge status: Home / Self Care | Payer: Medicare Other | Source: Ambulatory Visit | Attending: Family Medicine | Admitting: Family Medicine

## 2016-05-25 DIAGNOSIS — M5136 Other intervertebral disc degeneration, lumbar region: Secondary | ICD-10-CM | POA: Insufficient documentation

## 2016-05-25 DIAGNOSIS — D259 Leiomyoma of uterus, unspecified: Secondary | ICD-10-CM

## 2016-05-25 DIAGNOSIS — K429 Umbilical hernia without obstruction or gangrene: Secondary | ICD-10-CM | POA: Diagnosis not present

## 2016-05-25 DIAGNOSIS — K5730 Diverticulosis of large intestine without perforation or abscess without bleeding: Secondary | ICD-10-CM | POA: Diagnosis not present

## 2016-05-25 DIAGNOSIS — R1909 Other intra-abdominal and pelvic swelling, mass and lump: Secondary | ICD-10-CM

## 2016-05-25 DIAGNOSIS — I700 Atherosclerosis of aorta: Secondary | ICD-10-CM | POA: Insufficient documentation

## 2016-05-25 DIAGNOSIS — I2510 Atherosclerotic heart disease of native coronary artery without angina pectoris: Secondary | ICD-10-CM | POA: Diagnosis not present

## 2016-05-25 LAB — POCT I-STAT CREATININE: Creatinine, Ser: 1.1 mg/dL — ABNORMAL HIGH (ref 0.44–1.00)

## 2016-05-25 MED ORDER — IOPAMIDOL (ISOVUE-300) INJECTION 61%
100.0000 mL | Freq: Once | INTRAVENOUS | Status: AC | PRN
  Administered 2016-05-25: 100 mL via INTRAVENOUS

## 2016-05-26 ENCOUNTER — Other Ambulatory Visit: Payer: Self-pay | Admitting: Family Medicine

## 2016-05-26 DIAGNOSIS — I10 Essential (primary) hypertension: Secondary | ICD-10-CM

## 2016-05-26 DIAGNOSIS — I2510 Atherosclerotic heart disease of native coronary artery without angina pectoris: Secondary | ICD-10-CM

## 2016-05-26 DIAGNOSIS — E119 Type 2 diabetes mellitus without complications: Secondary | ICD-10-CM

## 2016-05-27 ENCOUNTER — Ambulatory Visit: Payer: Medicare Other | Admitting: Rheumatology

## 2016-06-02 ENCOUNTER — Other Ambulatory Visit: Payer: Self-pay | Admitting: *Deleted

## 2016-06-02 DIAGNOSIS — Z79899 Other long term (current) drug therapy: Secondary | ICD-10-CM

## 2016-06-04 ENCOUNTER — Ambulatory Visit (INDEPENDENT_AMBULATORY_CARE_PROVIDER_SITE_OTHER): Payer: Medicare Other | Admitting: Rheumatology

## 2016-06-04 ENCOUNTER — Encounter: Payer: Self-pay | Admitting: Rheumatology

## 2016-06-04 VITALS — BP 128/50 | HR 63 | Resp 16 | Ht 62.0 in | Wt 134.0 lb

## 2016-06-04 DIAGNOSIS — M05761 Rheumatoid arthritis with rheumatoid factor of right knee without organ or systems involvement: Secondary | ICD-10-CM | POA: Diagnosis not present

## 2016-06-04 DIAGNOSIS — M05771 Rheumatoid arthritis with rheumatoid factor of right ankle and foot without organ or systems involvement: Secondary | ICD-10-CM

## 2016-06-04 DIAGNOSIS — M05772 Rheumatoid arthritis with rheumatoid factor of left ankle and foot without organ or systems involvement: Secondary | ICD-10-CM

## 2016-06-04 DIAGNOSIS — M47816 Spondylosis without myelopathy or radiculopathy, lumbar region: Secondary | ICD-10-CM

## 2016-06-04 DIAGNOSIS — M05762 Rheumatoid arthritis with rheumatoid factor of left knee without organ or systems involvement: Secondary | ICD-10-CM

## 2016-06-04 DIAGNOSIS — M810 Age-related osteoporosis without current pathological fracture: Secondary | ICD-10-CM | POA: Diagnosis not present

## 2016-06-04 DIAGNOSIS — Z79899 Other long term (current) drug therapy: Secondary | ICD-10-CM

## 2016-06-04 DIAGNOSIS — Z8639 Personal history of other endocrine, nutritional and metabolic disease: Secondary | ICD-10-CM

## 2016-06-04 DIAGNOSIS — M19011 Primary osteoarthritis, right shoulder: Secondary | ICD-10-CM | POA: Diagnosis not present

## 2016-06-04 DIAGNOSIS — M05742 Rheumatoid arthritis with rheumatoid factor of left hand without organ or systems involvement: Secondary | ICD-10-CM

## 2016-06-04 DIAGNOSIS — M19012 Primary osteoarthritis, left shoulder: Secondary | ICD-10-CM

## 2016-06-04 DIAGNOSIS — I2510 Atherosclerotic heart disease of native coronary artery without angina pectoris: Secondary | ICD-10-CM

## 2016-06-04 DIAGNOSIS — M0579 Rheumatoid arthritis with rheumatoid factor of multiple sites without organ or systems involvement: Secondary | ICD-10-CM

## 2016-06-04 DIAGNOSIS — M05741 Rheumatoid arthritis with rheumatoid factor of right hand without organ or systems involvement: Secondary | ICD-10-CM | POA: Diagnosis not present

## 2016-06-04 DIAGNOSIS — Z8679 Personal history of other diseases of the circulatory system: Secondary | ICD-10-CM | POA: Diagnosis not present

## 2016-06-04 LAB — COMPLETE METABOLIC PANEL WITH GFR
ALBUMIN: 3.8 g/dL (ref 3.6–5.1)
ALK PHOS: 59 U/L (ref 33–130)
ALT: 8 U/L (ref 6–29)
AST: 12 U/L (ref 10–35)
BILIRUBIN TOTAL: 0.3 mg/dL (ref 0.2–1.2)
BUN: 27 mg/dL — ABNORMAL HIGH (ref 7–25)
CALCIUM: 10.2 mg/dL (ref 8.6–10.4)
CO2: 26 mmol/L (ref 20–31)
CREATININE: 1.06 mg/dL — AB (ref 0.60–0.88)
Chloride: 108 mmol/L (ref 98–110)
GFR, EST AFRICAN AMERICAN: 57 mL/min — AB (ref >=60)
GFR, Est Non African American: 49 mL/min — ABNORMAL LOW (ref >=60)
Glucose, Bld: 105 mg/dL — ABNORMAL HIGH (ref 65–99)
Potassium: 3.8 mmol/L (ref 3.5–5.3)
Sodium: 144 mmol/L (ref 135–146)
TOTAL PROTEIN: 6.2 g/dL (ref 6.1–8.1)

## 2016-06-04 LAB — CBC WITH DIFFERENTIAL/PLATELET
BASOS ABS: 0 {cells}/uL (ref 0–200)
BASOS PCT: 0 %
EOS ABS: 82 {cells}/uL (ref 15–500)
Eosinophils Relative: 1 %
HEMATOCRIT: 39.4 % (ref 35.0–45.0)
HEMOGLOBIN: 12.6 g/dL (ref 11.7–15.5)
Lymphocytes Relative: 15 %
Lymphs Abs: 1230 cells/uL (ref 850–3900)
MCH: 30.3 pg (ref 27.0–33.0)
MCHC: 32 g/dL (ref 32.0–36.0)
MCV: 94.7 fL (ref 80.0–100.0)
MONO ABS: 246 {cells}/uL (ref 200–950)
MPV: 8.9 fL (ref 7.5–12.5)
Monocytes Relative: 3 %
NEUTROS ABS: 6642 {cells}/uL (ref 1500–7800)
Neutrophils Relative %: 81 %
Platelets: 273 10*3/uL (ref 140–400)
RBC: 4.16 MIL/uL (ref 3.80–5.10)
RDW: 14.4 % (ref 11.0–15.0)
WBC: 8.2 10*3/uL (ref 3.8–10.8)

## 2016-06-04 MED ORDER — METHOTREXATE 2.5 MG PO TABS: 7.5000 mg | ORAL_TABLET | ORAL | 0 refills | Status: DC

## 2016-06-17 ENCOUNTER — Ambulatory Visit (INDEPENDENT_AMBULATORY_CARE_PROVIDER_SITE_OTHER): Payer: Medicare Other | Admitting: Cardiology

## 2016-06-17 ENCOUNTER — Encounter: Payer: Self-pay | Admitting: Cardiology

## 2016-06-17 VITALS — BP 132/70 | HR 52 | Ht 59.5 in | Wt 132.0 lb

## 2016-06-17 DIAGNOSIS — E119 Type 2 diabetes mellitus without complications: Secondary | ICD-10-CM

## 2016-06-17 DIAGNOSIS — I2510 Atherosclerotic heart disease of native coronary artery without angina pectoris: Secondary | ICD-10-CM | POA: Diagnosis not present

## 2016-06-17 DIAGNOSIS — R001 Bradycardia, unspecified: Secondary | ICD-10-CM | POA: Diagnosis not present

## 2016-06-17 DIAGNOSIS — E782 Mixed hyperlipidemia: Secondary | ICD-10-CM

## 2016-06-17 DIAGNOSIS — I10 Essential (primary) hypertension: Secondary | ICD-10-CM

## 2016-06-17 MED ORDER — CLOPIDOGREL BISULFATE 75 MG PO TABS: 75.0000 mg | ORAL_TABLET | Freq: Every day | ORAL | 3 refills | Status: DC

## 2016-06-17 NOTE — Progress Notes (Signed)
Clinical Summary Stacy Meyers is a 83 y.o.female seen today as a new patient. Last seen by Dr Domenic Polite in 2012. Referred by Dr Moshe Cipro for CAD risk factor modification.   1. Bradycardia - seen prevoiusly in cardiology clinic for low heart rates - she denies any recent symptoms. Remains on low dose diltiatzem   2. HTN - does not check at home - compliant with meds  3. Hyperlipidemia - 02/2016 TC 171 TG 130 HDL 63 LDL 82 - she is on crestor  4. DM2 - diet controlled - burning stomach on aspirin, has not been taking.     Past Medical History:  Diagnosis Date  . Acute cholecystitis 12/16/2010  . Anemia 12/14/2010  . ARF (acute renal failure) ([place]) 12/14/2010  . Bradycardia    Sinus - noted 6/11  . Cholelithiasis 12/14/2010  . Collagen vascular disease ([place])   . Diastolic dysfunction 72/53/6644   Grade 1 per 2-D echocardiogram  . Dysrhythmia   . Essential hypertension, benign   . HTN (hypertension) 12/14/2010  . Hypercalcemia 12/14/2010  . Hypokalemia 12/14/2010  . Mixed hyperlipidemia   . Osteoarthritis   . Rheumatoid arthritis(714.0)      Allergies  Allergen Reactions  . Aspirin Other (See Comments)    REACTION: burns stomach (uncoated)  . Sudafed [Pseudoephedrine Hcl] Other (See Comments)    Increased joint pain     Current Outpatient Prescriptions  Medication Sig Dispense Refill  . acetaminophen (TYLENOL) 500 MG tablet Take 1,000 mg by mouth every 6 (six) hours as needed. Reported on 05/28/2015    . acyclovir (ZOVIRAX) 400 MG tablet TAKE 1 TABLET BY MOUTH TWICE A DAY 180 tablet 1  . alendronate (FOSAMAX) 70 MG tablet TAKE 1 TABLET BY MOUTH EVERY 7 DAYS. TAKE WITH A FULL GLASS OF WATER ON AN EMPTY STOMACH. 12 tablet 1  . amLODipine (NORVASC) 2.5 MG tablet TAKE 1 TABLET BY MOUTH EVERY DAY 90 tablet 1  . azelastine (ASTELIN) 0.1 % nasal spray Place 2 sprays into both nostrils 2 (two) times daily. Use in each nostril as directed (Patient taking differently: Place 2  sprays into both nostrils 2 (two) times daily as needed. Use in each nostril as directed) 30 mL 12  . diltiazem (CARDIZEM CD) 120 MG 24 hr capsule TAKE 1 CAPSULE (120 MG TOTAL) BY MOUTH DAILY.  1  . folic acid (FOLVITE) 1 MG tablet Take 1 tablet (1 mg total) by mouth daily. 90 tablet 4  . KLOR-CON M20 20 MEQ tablet TAKE 1 TABLET (20 MEQ TOTAL) BY MOUTH DAILY. 90 tablet 1  . methotrexate (RHEUMATREX) 2.5 MG tablet Take 3 tablets (7.5 mg total) by mouth once a week. Caution:Chemotherapy. Protect from light. 36 tablet 0  . montelukast (SINGULAIR) 10 MG tablet TAKE 1 TABLET (10 MG TOTAL) BY MOUTH AT BEDTIME. (Patient taking differently: Take 10 mg by mouth 2 (two) times daily as needed. ) 30 tablet 3  . predniSONE (DELTASONE) 5 MG tablet Take 1 tablet (5 mg total) by mouth daily. 90 tablet 1  . rosuvastatin (CRESTOR) 5 MG tablet One tablet every other day 90 tablet 1  . Vitamin D, Ergocalciferol, (DRISDOL) 50000 units CAPS capsule TAKE 1 CAPSULE (50,000 UNITS TOTAL) BY MOUTH ONCE A WEEK. 12 capsule 0   No current facility-administered medications for this visit.      Past Surgical History:  Procedure Laterality Date  . Bilateral cataract surgery  2009  . BUNIONECTOMY Right 2012  . CHOLECYSTECTOMY  12/17/2010   Procedure: LAPAROSCOPIC CHOLECYSTECTOMY;  Surgeon: Donato Heinz;  Location: [facility];  Service: General;  Laterality: N/A;  . COLONOSCOPY  05/22/2011   Procedure: COLONOSCOPY;  Surgeon: Rogene Houston, MD;  Location: [facility];  Service: Endoscopy;  Laterality: N/A;  1045  . EYE SURGERY Bilateral    cataract  . GANGLION CYST EXCISION Left 06/02/2012   Procedure: Excision ganglion left dorsal wrist;  Surgeon: Sanjuana Kava, MD;  Location: [facility];  Service: Orthopedics;  Laterality: Left;  . L2-L3 discectomy  2002  . L3-L4, L4-L5 lumbar decompression  2011  . Right shoulder cyst excision  2010  . SPINE SURGERY     twice, approx 2006, 2011     Allergies  Allergen Reactions  .  Aspirin Other (See Comments)    REACTION: burns stomach (uncoated)  . Sudafed [Pseudoephedrine Hcl] Other (See Comments)    Increased joint pain      Family History  Problem Relation Age of Onset  . Stroke Mother   . Cancer Father   . Diabetes Sister   . Diabetes Sister   . Kidney failure Brother   . Kidney failure Brother   . Pancreatic cancer Sister   . Kidney Stones Sister        Only one Kidney  . Arthritis Sister   . Heart disease Daughter      Social History Ms. Coberly reports that she has never smoked. She has never used smokeless tobacco. Ms. Klos reports that she does not drink alcohol.   Review of Systems CONSTITUTIONAL: No weight loss, fever, chills, weakness or fatigue.  HEENT: Eyes: No visual loss, blurred vision, double vision or yellow sclerae.No hearing loss, sneezing, congestion, runny nose or sore throat.  SKIN: No rash or itching.  CARDIOVASCULAR: per hpi RESPIRATORY: No shortness of breath, cough or sputum.  GASTROINTESTINAL: No anorexia, nausea, vomiting or diarrhea. No abdominal pain or blood.  GENITOURINARY: No burning on urination, no polyuria NEUROLOGICAL: No headache, dizziness, syncope, paralysis, ataxia, numbness or tingling in the extremities. No change in bowel or bladder control.  MUSCULOSKELETAL: No muscle, back pain, joint pain or stiffness.  LYMPHATICS: No enlarged nodes. No history of splenectomy.  PSYCHIATRIC: No history of depression or anxiety.  ENDOCRINOLOGIC: No reports of sweating, cold or heat intolerance. No polyuria or polydipsia.  Marland Kitchen   Physical Examination Vitals:   06/17/16 0838 06/17/16 0843  BP: (!) 136/56 132/70  Pulse: (!) 52    Vitals:   06/17/16 0838  Weight: 132 lb (59.9 kg)  Height: 4' 11.5" (1.511 m)    Gen: resting comfortably, no acute distress HEENT: no scleral icterus, pupils equal round and reactive, no palptable cervical adenopathy,  CV: rate 55, no m/r/g, no jvd Resp: Clear to auscultation  bilaterally GI: abdomen is soft, non-tender, non-distended, normal bowel sounds, no hepatosplenomegaly MSK: extremities are warm, no edema.  Skin: warm, no rash Neuro:  no focal deficits Psych: appropriate affect    Assessment and Plan  1. Bradycardia - mild sinus bradycardia by ekg in clinic today, no symptoms - if progresses over time would discontinue diltiazem  2. HTN - at goal. Would consider stopping dilt and titrating up her amlodopine, currently she is on 2 CCBs  3. Hyperlipidemia - reasonable control, contiue crestor   4. DM2 - glucose control per pcp - her 10 year risk for ASCVD is nearly 40%, she is high risk. Has not been able to take aspirin due to adverse reaction -  given her high risk, we will start plavix 75mg  daily for primary prevention.   F/u 1 year  Arnoldo Lenis, M.D.

## 2016-06-17 NOTE — Patient Instructions (Signed)
Medication Instructions:  START PLAVIX 75 MG ONCE DAILY   Labwork: NONE  Testing/Procedures: NONE  Follow-Up: Your physician wants you to follow-up in: [place] DR. BRANCH.  You will receive a reminder letter in the mail two months in advance. If you don't receive a letter, please call our office to schedule the follow-up appointment.   Any Other Special Instructions Will Be Listed Below (If Applicable).     If you need a refill on your cardiac medications before your next appointment, please call your pharmacy.

## 2016-06-21 ENCOUNTER — Other Ambulatory Visit: Payer: Self-pay | Admitting: Family Medicine

## 2016-07-06 ENCOUNTER — Telehealth: Payer: Self-pay | Admitting: Family Medicine

## 2016-07-06 NOTE — Telephone Encounter (Signed)
Spoke with pt and advised her to let the cardiology office where she hd the EKG done to notify them of this

## 2016-07-06 NOTE — Telephone Encounter (Signed)
Patient left message today on nurse line stating that she had an EKG on 13th of June and was told if she noted any bruising after the procedure that she should contact her PCP, so she is calling to let you know there is bruising.    Note:  I returned patient's call to get more detailed info, but got voice mail    cb

## 2016-07-13 NOTE — Progress Notes (Signed)
Office Visit Note  Patient: Stacy Meyers             Date of Birth: [birth year]           MRN: [id number]             PCP: Fayrene Helper, MD Referring: Fayrene Helper, MD Visit Date: 07/14/2016 Occupation: _0 @    Subjective:  No chief complaint on file.   History of Present Illness: Stacy Meyers is a 83 y.o. female  Whose last seen in our office on 04/23/2016 for rheumatoid arthritis (with rheumatoid factor positive), methotrexate 2 pills per week, folic acid 1 mg daily, prednisone 5 mg daily, right knee joint pain and swelling for which she got a cortisone injection. She was reviewed with sit to return back so we can ensure that she is not having any synovitis/flares to her right knee and Return in about 6 weeks (around 07/16/2016) for RA, RA & OA right knee, mtx --> 3/wk now; recheck kidney in 6 wks.  Today, patient reports that she is doing very well with her right knee as well as the increased dose of methotrexate at 3 per week. She continues to tolerate the medication well and has not any flares with her right knee. She continues to take folic acid 1 mg daily and prednisone 5 mg every day.  Patient reports that her PCP did EKG recently and felt that she would benefit from being on a daily Plavix dose.  Activities of Daily Living:  Patient reports morning stiffness for 10 minutes.   Patient Denies nocturnal pain.  Difficulty dressing/grooming: Reports Difficulty climbing stairs: Reports Difficulty getting out of chair: Reports Difficulty using hands for taps, buttons, cutlery, and/or writing: Reports   Review of Systems  Constitutional: Negative for fatigue.  HENT: Negative for mouth sores and mouth dryness.   Eyes: Negative for dryness.  Respiratory: Negative for shortness of breath.   Gastrointestinal: Negative for constipation and diarrhea.  Musculoskeletal: Negative for myalgias and myalgias.  Skin: Negative for sensitivity to sunlight.    Psychiatric/Behavioral: Negative for decreased concentration and sleep disturbance.    PMFS History:  Patient Active Problem List   Diagnosis Date Noted  . Fibroids 05/08/2016  . Abdominal mass 05/08/2016  . Spondylosis of lumbar region without myelopathy or radiculopathy 12/24/2015  . Solitary bone cyst of right shoulder 05/28/2015  . Right knee pain 05/28/2015  . Osteoarthritis of both shoulders 05/28/2015  . Nocturia 12/04/2013  . Ganglion cyst of wrist 05/11/2012  . Systolic murmur 56/31/4970  . Diastolic dysfunction 26/37/8588  . Seasonal allergies 08/28/2010  . Type 2 diabetes, diet controlled ([place]) 07/25/2009  . Herpes simplex virus (HSV) infection 09/16/2008  . Rheumatoid arthritis with rheumatoid factor of multiple sites without organ or systems involvement ([place]) 09/16/2008  . Hyperlipidemia LDL goal <100 05/23/2008  . Essential hypertension, benign 05/23/2008    Past Medical History:  Diagnosis Date  . Acute cholecystitis 12/16/2010  . Anemia 12/14/2010  . ARF (acute renal failure) ([place]) 12/14/2010  . Bradycardia    Sinus - noted 6/11  . Cholelithiasis 12/14/2010  . Collagen vascular disease ([place])   . Diastolic dysfunction 50/27/7412   Grade 1 per 2-D echocardiogram  . Dysrhythmia   . Essential hypertension, benign   . HTN (hypertension) 12/14/2010  . Hypercalcemia 12/14/2010  . Hypokalemia 12/14/2010  . Mixed hyperlipidemia   . Osteoarthritis   . Rheumatoid arthritis(714.0)     Family History  Problem Relation Age  of Onset  . Stroke Mother   . Cancer Father   . Diabetes Sister   . Diabetes Sister   . Kidney failure Brother   . Kidney failure Brother   . Pancreatic cancer Sister   . Kidney Stones Sister        Only one Kidney  . Arthritis Sister   . Heart disease Daughter    Past Surgical History:  Procedure Laterality Date  . Bilateral cataract surgery  2009  . BUNIONECTOMY Right 2012  . CHOLECYSTECTOMY  12/17/2010   Procedure: LAPAROSCOPIC  CHOLECYSTECTOMY;  Surgeon: Donato Heinz;  Location: [facility];  Service: General;  Laterality: N/A;  . COLONOSCOPY  05/22/2011   Procedure: COLONOSCOPY;  Surgeon: Rogene Houston, MD;  Location: [facility];  Service: Endoscopy;  Laterality: N/A;  1045  . EYE SURGERY Bilateral    cataract  . GANGLION CYST EXCISION Left 06/02/2012   Procedure: Excision ganglion left dorsal wrist;  Surgeon: Sanjuana Kava, MD;  Location: [facility];  Service: Orthopedics;  Laterality: Left;  . L2-L3 discectomy  2002  . L3-L4, L4-L5 lumbar decompression  2011  . Right shoulder cyst excision  2010  . SPINE SURGERY     twice, approx 2006, 2011   Social History   Social History Narrative  . No narrative on file     Objective: Vital Signs: There were no vitals taken for this visit.   Physical Exam  Constitutional: She is oriented to person, place, and time. She appears well-developed and well-nourished.  HENT:  Head: Normocephalic and atraumatic.  Eyes: EOM are normal. Pupils are equal, round, and reactive to light.  Cardiovascular: Normal rate, regular rhythm and normal heart sounds.  Exam reveals no gallop and no friction rub.   No murmur heard. Pulmonary/Chest: Effort normal and breath sounds normal. She has no wheezes. She has no rales.  Abdominal: Soft. Bowel sounds are normal. She exhibits no distension. There is no tenderness. There is no guarding. No hernia.  Musculoskeletal: Normal range of motion. She exhibits no edema, tenderness or deformity.  Lymphadenopathy:    She has no cervical adenopathy.  Neurological: She is alert and oriented to person, place, and time. Coordination normal.  Skin: Skin is warm and dry. Capillary refill takes less than 2 seconds. No rash noted.  Psychiatric: She has a normal mood and affect. Her behavior is normal.  Nursing note and vitals reviewed.    Musculoskeletal Exam:  Full range of motion of all joints except history of decreased abduction to bilateral  shoulders with only 15-20 of abduction of the shoulder; about 20 contracture of bilateral elbows.; Ulnar deviation of bilateral hands. Grip strength is equal and strong bilaterally Fiber myalgia tender points are absent  CDAI Exam: No CDAI exam completed.  No synovitis on examination  Investigation: No additional findings. Office Visit on 06/04/2016  Component Date Value Ref Range Status  . WBC 06/04/2016 8.2  3.8 - 10.8 K/uL Final  . RBC 06/04/2016 4.16  3.80 - 5.10 MIL/uL Final  . Hemoglobin 06/04/2016 12.6  11.7 - 15.5 g/dL Final  . HCT 06/04/2016 39.4  35.0 - 45.0 % Final  . MCV 06/04/2016 94.7  80.0 - 100.0 fL Final  . MCH 06/04/2016 30.3  27.0 - 33.0 pg Final  . MCHC 06/04/2016 32.0  32.0 - 36.0 g/dL Final  . RDW 06/04/2016 14.4  11.0 - 15.0 % Final  . Platelets 06/04/2016 273  140 - 400 K/uL Final  . MPV  06/04/2016 8.9  7.5 - 12.5 fL Final  . Neutro Abs 06/04/2016 6642  1,500 - 7,800 cells/uL Final  . Lymphs Abs 06/04/2016 1230  850 - 3,900 cells/uL Final  . Monocytes Absolute 06/04/2016 246  200 - 950 cells/uL Final  . Eosinophils Absolute 06/04/2016 82  15 - 500 cells/uL Final  . Basophils Absolute 06/04/2016 0  0 - 200 cells/uL Final  . Neutrophils Relative % 06/04/2016 81  % Final  . Lymphocytes Relative 06/04/2016 15  % Final  . Monocytes Relative 06/04/2016 3  % Final  . Eosinophils Relative 06/04/2016 1  % Final  . Basophils Relative 06/04/2016 0  % Final  . Smear Review 06/04/2016 Criteria for review not met   Final  . Sodium 06/04/2016 144  135 - 146 mmol/L Final  . Potassium 06/04/2016 3.8  3.5 - 5.3 mmol/L Final  . Chloride 06/04/2016 108  98 - 110 mmol/L Final  . CO2 06/04/2016 26  20 - 31 mmol/L Final  . Glucose, Bld 06/04/2016 105* 65 - 99 mg/dL Final  . BUN 06/04/2016 27* 7 - 25 mg/dL Final  . Creat 06/04/2016 1.06* 0.60 - 0.88 mg/dL Final   Comment:   For patients > or = 81 years of age: The upper reference limit for Creatinine is approximately 13%  higher for people identified as African-American.     . Total Bilirubin 06/04/2016 0.3  0.2 - 1.2 mg/dL Final  . Alkaline Phosphatase 06/04/2016 59  33 - 130 U/L Final  . AST 06/04/2016 12  10 - 35 U/L Final  . ALT 06/04/2016 8  6 - 29 U/L Final  . Total Protein 06/04/2016 6.2  6.1 - 8.1 g/dL Final  . Albumin 06/04/2016 3.8  3.6 - 5.1 g/dL Final  . Calcium 06/04/2016 10.2  8.6 - 10.4 mg/dL Final  . GFR, Est African American 06/04/2016 57* >=60 mL/min Final  . GFR, Est Non African American 06/04/2016 49* >=60 mL/min Final  Hospital Outpatient Visit on 05/25/2016  Component Date Value Ref Range Status  . Creatinine, Ser 05/25/2016 1.10* 0.44 - 1.00 mg/dL Final  Orders Only on 03/12/2016  Component Date Value Ref Range Status  . Sodium 03/12/2016 143  135 - 146 mmol/L Final  . Potassium 03/12/2016 4.1  3.5 - 5.3 mmol/L Final  . Chloride 03/12/2016 107  98 - 110 mmol/L Final  . CO2 03/12/2016 24  20 - 31 mmol/L Final  . Glucose, Bld 03/12/2016 93  65 - 99 mg/dL Final  . BUN 03/12/2016 22  7 - 25 mg/dL Final  . Creat 03/12/2016 0.90* 0.60 - 0.88 mg/dL Final   Comment:   For patients > or = 81 years of age: The upper reference limit for Creatinine is approximately 13% higher for people identified as African-American.     . Total Bilirubin 03/12/2016 0.4  0.2 - 1.2 mg/dL Final  . Alkaline Phosphatase 03/12/2016 56  33 - 130 U/L Final  . AST 03/12/2016 12  10 - 35 U/L Final  . ALT 03/12/2016 6  6 - 29 U/L Final  . Total Protein 03/12/2016 6.5  6.1 - 8.1 g/dL Final  . Albumin 03/12/2016 4.0  3.6 - 5.1 g/dL Final  . Calcium 03/12/2016 9.9  8.6 - 10.4 mg/dL Final  . GFR, Est African American 03/12/2016 69  >=60 mL/min Final  . GFR, Est Non African American 03/12/2016 60  >=60 mL/min Final  . WBC 03/12/2016 6.8  3.8 - 10.8 K/uL Final  .  RBC 03/12/2016 4.16  3.80 - 5.10 MIL/uL Final  . Hemoglobin 03/12/2016 12.9  11.7 - 15.5 g/dL Final  . HCT 03/12/2016 39.3  35.0 - 45.0 % Final  . MCV  03/12/2016 94.5  80.0 - 100.0 fL Final  . MCH 03/12/2016 31.0  27.0 - 33.0 pg Final  . MCHC 03/12/2016 32.8  32.0 - 36.0 g/dL Final  . RDW 03/12/2016 14.9  11.0 - 15.0 % Final  . Platelets 03/12/2016 242  140 - 400 K/uL Final  . MPV 03/12/2016 8.9  7.5 - 12.5 fL Final  . Neutro Abs 03/12/2016 4284  1,500 - 7,800 cells/uL Final  . Lymphs Abs 03/12/2016 1836  850 - 3,900 cells/uL Final  . Monocytes Absolute 03/12/2016 544  200 - 950 cells/uL Final  . Eosinophils Absolute 03/12/2016 136  15 - 500 cells/uL Final  . Basophils Absolute 03/12/2016 0  0 - 200 cells/uL Final  . Neutrophils Relative % 03/12/2016 63  % Final  . Lymphocytes Relative 03/12/2016 27  % Final  . Monocytes Relative 03/12/2016 8  % Final  . Eosinophils Relative 03/12/2016 2  % Final  . Basophils Relative 03/12/2016 0  % Final  . Smear Review 03/12/2016 Criteria for review not met   Final     Imaging: No results found.  Speciality Comments: No specialty comments available.    Procedures:  No procedures performed Allergies: Aspirin and Sudafed [pseudoephedrine hcl]   Assessment / Plan:     Visit Diagnoses: No diagnosis found.    Plan: #1: Rheumatoid arthritis with positive rheumatoid factor Patient is doing very well with her rheumatoid arthritis. She is on 3 methotrexate that's giving her adequate control. No side effects or adverse reactions to increased dose of methotrexate  #2: High risk prescription Methotrexate 3 pills per week (7.5 mg weekly) Folic acid 1 mg daily Prednisone 5 mg daily Today, I will do a repeat CBC with differential and CMP with GFR to see how she is responding to the higher dose of methotrexate of 3 pills per week.  #3: History of right knee joint pain that required cortisone injection on the last visit. We were considering whether it's a flare of her RA versus osteoarthritis. My suspicion is that patient right knee pain was a flare of her rheumatoid arthritis  #4: Rheumatoid  arthritis affecting bilateral hands, bilateral knees, bilateral feet  #5: Osteoarthritis affecting bilateral shoulders  #6: Spondylosis of lumbar region  #7: Return to clinic in 2 months at which time we'll do a repeat CBC with differential and CP with GFR and confirm that patient is responding well to the increased dose of methotrexate.  #8: On exam today there was bibasilar wheezing but no shortness of breath, no fever. She is not in any acute distress and she is able to speak in complete sentences. She does not get short of breath with ambulation. I've asked the patient to see how she does over the next few days and follow with her PCP, Dr. Moshe Cipro if symptoms worsen. I've asked the patient to make an appointment with her PCP for Monday and canceled the appointment if she does not need to be seen. Patient understands and is agreeable  Orders: No orders of the defined types were placed in this encounter.  No orders of the defined types were placed in this encounter.   Face-to-face time spent with patient was 30 minutes. 50% of time was spent in counseling and coordination of care.  Follow-Up Instructions: No  Follow-up on file.   Eliezer Lofts, PA-C  Note - This record has been created using [employer].  Chart creation errors have been sought, but may not always  have been located. Such creation errors do not reflect on  the standard of medical care.

## 2016-07-14 ENCOUNTER — Ambulatory Visit (INDEPENDENT_AMBULATORY_CARE_PROVIDER_SITE_OTHER): Payer: Medicare Other | Admitting: Rheumatology

## 2016-07-14 ENCOUNTER — Encounter: Payer: Self-pay | Admitting: Rheumatology

## 2016-07-14 VITALS — Resp 12 | Ht 59.0 in | Wt 133.0 lb

## 2016-07-14 DIAGNOSIS — N189 Chronic kidney disease, unspecified: Secondary | ICD-10-CM

## 2016-07-14 DIAGNOSIS — M170 Bilateral primary osteoarthritis of knee: Secondary | ICD-10-CM

## 2016-07-14 DIAGNOSIS — M19072 Primary osteoarthritis, left ankle and foot: Secondary | ICD-10-CM

## 2016-07-14 DIAGNOSIS — M05762 Rheumatoid arthritis with rheumatoid factor of left knee without organ or systems involvement: Secondary | ICD-10-CM

## 2016-07-14 DIAGNOSIS — Z79899 Other long term (current) drug therapy: Secondary | ICD-10-CM | POA: Diagnosis not present

## 2016-07-14 DIAGNOSIS — M05742 Rheumatoid arthritis with rheumatoid factor of left hand without organ or systems involvement: Secondary | ICD-10-CM

## 2016-07-14 DIAGNOSIS — M05771 Rheumatoid arthritis with rheumatoid factor of right ankle and foot without organ or systems involvement: Secondary | ICD-10-CM | POA: Diagnosis not present

## 2016-07-14 DIAGNOSIS — M19071 Primary osteoarthritis, right ankle and foot: Secondary | ICD-10-CM

## 2016-07-14 DIAGNOSIS — M05741 Rheumatoid arthritis with rheumatoid factor of right hand without organ or systems involvement: Secondary | ICD-10-CM

## 2016-07-14 DIAGNOSIS — M05761 Rheumatoid arthritis with rheumatoid factor of right knee without organ or systems involvement: Secondary | ICD-10-CM | POA: Diagnosis not present

## 2016-07-14 DIAGNOSIS — M05772 Rheumatoid arthritis with rheumatoid factor of left ankle and foot without organ or systems involvement: Secondary | ICD-10-CM | POA: Diagnosis not present

## 2016-07-14 DIAGNOSIS — M19042 Primary osteoarthritis, left hand: Secondary | ICD-10-CM

## 2016-07-14 DIAGNOSIS — Z79631 Long term (current) use of antimetabolite agent: Secondary | ICD-10-CM

## 2016-07-14 DIAGNOSIS — M19041 Primary osteoarthritis, right hand: Secondary | ICD-10-CM

## 2016-07-14 DIAGNOSIS — M0579 Rheumatoid arthritis with rheumatoid factor of multiple sites without organ or systems involvement: Secondary | ICD-10-CM | POA: Diagnosis not present

## 2016-07-14 DIAGNOSIS — I2510 Atherosclerotic heart disease of native coronary artery without angina pectoris: Secondary | ICD-10-CM

## 2016-07-14 LAB — CBC WITH DIFFERENTIAL/PLATELET
BASOS PCT: 0 %
Basophils Absolute: 0 cells/uL (ref 0–200)
EOS ABS: 67 {cells}/uL (ref 15–500)
EOS PCT: 1 %
HCT: 40.5 % (ref 35.0–45.0)
Hemoglobin: 13.5 g/dL (ref 11.7–15.5)
LYMPHS ABS: 1072 {cells}/uL (ref 850–3900)
Lymphocytes Relative: 16 %
MCH: 31.3 pg (ref 27.0–33.0)
MCHC: 33.3 g/dL (ref 32.0–36.0)
MCV: 94 fL (ref 80.0–100.0)
MPV: 8.4 fL (ref 7.5–12.5)
Monocytes Absolute: 335 cells/uL (ref 200–950)
Monocytes Relative: 5 %
NEUTROS ABS: 5226 {cells}/uL (ref 1500–7800)
Neutrophils Relative %: 78 %
Platelets: 258 10*3/uL (ref 140–400)
RBC: 4.31 MIL/uL (ref 3.80–5.10)
RDW: 14.6 % (ref 11.0–15.0)
WBC: 6.7 10*3/uL (ref 3.8–10.8)

## 2016-07-14 NOTE — Patient Instructions (Signed)
On exam today there was bibasilar wheezing but no shortness of breath, no fever. I've asked the patient to see how she does over the next few days and follow with her PCP, Dr. Moshe Cipro if symptoms worsen. I've asked the patient to make an appointment with her PCP for Monday and canceled the appointment if she does not need to be seen. Patient understands and is agreeable

## 2016-07-15 LAB — COMPLETE METABOLIC PANEL WITH GFR
ALT: 9 U/L (ref 6–29)
AST: 16 U/L (ref 10–35)
Albumin: 4.1 g/dL (ref 3.6–5.1)
Alkaline Phosphatase: 57 U/L (ref 33–130)
BILIRUBIN TOTAL: 0.5 mg/dL (ref 0.2–1.2)
BUN: 21 mg/dL (ref 7–25)
CO2: 21 mmol/L (ref 20–31)
CREATININE: 0.93 mg/dL — AB (ref 0.60–0.88)
Calcium: 9.9 mg/dL (ref 8.6–10.4)
Chloride: 106 mmol/L (ref 98–110)
GFR, Est African American: 66 mL/min (ref >=60)
GFR, Est Non African American: 57 mL/min — ABNORMAL LOW (ref >=60)
GLUCOSE: 92 mg/dL (ref 65–99)
Potassium: 4.5 mmol/L (ref 3.5–5.3)
SODIUM: 140 mmol/L (ref 135–146)
TOTAL PROTEIN: 6.6 g/dL (ref 6.1–8.1)

## 2016-07-17 ENCOUNTER — Ambulatory Visit: Payer: 59 | Admitting: Rheumatology

## 2016-07-19 ENCOUNTER — Other Ambulatory Visit: Payer: Self-pay | Admitting: Rheumatology

## 2016-07-20 NOTE — Telephone Encounter (Signed)
Last Visit: 07/14/16 Next Visit: 09/21/16  Okay to refill per Dr. Estanislado Pandy

## 2016-08-12 ENCOUNTER — Other Ambulatory Visit: Payer: Self-pay | Admitting: Family Medicine

## 2016-08-12 DIAGNOSIS — E785 Hyperlipidemia, unspecified: Secondary | ICD-10-CM

## 2016-08-19 ENCOUNTER — Other Ambulatory Visit: Payer: Self-pay | Admitting: Family Medicine

## 2016-08-24 ENCOUNTER — Other Ambulatory Visit: Payer: Self-pay | Admitting: Rheumatology

## 2016-08-24 ENCOUNTER — Other Ambulatory Visit: Payer: Self-pay | Admitting: Family Medicine

## 2016-08-24 NOTE — Telephone Encounter (Signed)
07/14/16 last visit  09/21/16 next visit    CBC Latest Ref Rng & Units 07/14/2016 06/04/2016 03/12/2016  WBC 3.8 - 10.8 K/uL 6.7 8.2 6.8  Hemoglobin 11.7 - 15.5 g/dL 13.5 12.6 12.9  Hematocrit 35.0 - 45.0 % 40.5 39.4 39.3  Platelets 140 - 400 K/uL 258 273 242   CMP Latest Ref Rng & Units 07/14/2016 06/04/2016 05/25/2016  Glucose 65 - 99 mg/dL 92 105(H) -  BUN 7 - 25 mg/dL 21 27(H) -  Creatinine 0.60 - 0.88 mg/dL 0.93(H) 1.06(H) 1.10(H)  Sodium 135 - 146 mmol/L 140 144 -  Potassium 3.5 - 5.3 mmol/L 4.5 3.8 -  Chloride 98 - 110 mmol/L 106 108 -  CO2 20 - 31 mmol/L 21 26 -  Calcium 8.6 - 10.4 mg/dL 9.9 10.2 -  Total Protein 6.1 - 8.1 g/dL 6.6 6.2 -  Total Bilirubin 0.2 - 1.2 mg/dL 0.5 0.3 -  Alkaline Phos 33 - 130 U/L 57 59 -  AST 10 - 35 U/L 16 12 -  ALT 6 - 29 U/L 9 8 -    Ok to refill per Dr Estanislado Pandy

## 2016-08-24 NOTE — Telephone Encounter (Signed)
Seen 5 2 18 

## 2016-09-13 NOTE — Progress Notes (Signed)
Office Visit Note  Patient: Stacy Meyers             Date of Birth: 1932-04-12           MRN: [id number]             PCP: Stacy Meyers Referring: Stacy Meyers Visit Date: 09/21/2016 Occupation: @GUAROCC @    Subjective:  Joint stiffnesss.   History of Present Illness: Stacy Meyers is a 83 y.o. female with history of sero positive rheumatoid arthritis. She states she is doing quite well on the current combination of medication. She is taking methotrexate 3 tablets per week along with prednisone 5 mg a day. She states she has tried tapering prednisone in the past but was not successful. She's not having much joint discomfort currently.  Activities of Daily Living:  Patient reports morning stiffness for 5 minutes.   Patient Denies nocturnal pain.  Difficulty dressing/grooming: Denies Difficulty climbing stairs: Reports Difficulty getting out of chair: Reports Difficulty using hands for taps, buttons, cutlery, and/or writing: Denies   Review of Systems  Constitutional: Negative for night sweats, weight gain, weight loss and weakness.  HENT: Positive for mouth dryness. Negative for mouth sores, trouble swallowing, trouble swallowing and nose dryness.   Eyes: Negative for pain, redness, visual disturbance and dryness.  Respiratory: Negative for cough, shortness of breath and difficulty breathing.   Cardiovascular: Positive for hypertension. Negative for chest pain, palpitations, irregular heartbeat and swelling in legs/feet.  Gastrointestinal: Negative for blood in stool, constipation and diarrhea.  Endocrine: Negative for increased urination.  Genitourinary: Negative for vaginal dryness.  Musculoskeletal: Positive for arthralgias, joint pain and morning stiffness. Negative for joint swelling, myalgias, muscle weakness, muscle tenderness and myalgias.  Skin: Negative for color change, rash, hair loss, skin tightness, ulcers and sensitivity to sunlight.    Allergic/Immunologic: Negative for susceptible to infections.  Neurological: Negative for dizziness, memory loss and night sweats.  Hematological: Negative for swollen glands.  Psychiatric/Behavioral: Positive for sleep disturbance. Negative for depressed mood. The patient is nervous/anxious.     PMFS History:  Patient Active Problem List   Diagnosis Date Noted  . Fibroids 05/08/2016  . Abdominal mass 05/08/2016  . Spondylosis of lumbar region without myelopathy or radiculopathy 12/24/2015  . Solitary bone cyst of right shoulder 05/28/2015  . Right knee pain 05/28/2015  . Osteoarthritis of both shoulders 05/28/2015  . Nocturia 12/04/2013  . Ganglion cyst of wrist 05/11/2012  . Systolic murmur 50/53/9767  . Diastolic dysfunction 34/19/3790  . Seasonal allergies 08/28/2010  . Type 2 diabetes, diet controlled ([place]) 07/25/2009  . Herpes simplex virus (HSV) infection 09/16/2008  . Rheumatoid arthritis with rheumatoid factor of multiple sites without organ or systems involvement ([place]) 09/16/2008  . Hyperlipidemia LDL goal <100 05/23/2008  . Essential hypertension, benign 05/23/2008    Past Medical History:  Diagnosis Date  . Acute cholecystitis 12/16/2010  . Anemia 12/14/2010  . ARF (acute renal failure) ([place]) 12/14/2010  . Bradycardia    Sinus - noted 6/11  . Cholelithiasis 12/14/2010  . Collagen vascular disease ([place])   . Diastolic dysfunction 24/09/7351   Grade 1 per 2-D echocardiogram  . Dysrhythmia   . Essential hypertension, benign   . HTN (hypertension) 12/14/2010  . Hypercalcemia 12/14/2010  . Hypokalemia 12/14/2010  . Mixed hyperlipidemia   . Osteoarthritis   . Rheumatoid arthritis(714.0)     Family History  Problem Relation Age of Onset  . Stroke Mother   .  Cancer Father   . Diabetes Sister   . Diabetes Sister   . Kidney failure Brother   . Kidney failure Brother   . Pancreatic cancer Sister   . Kidney Stones Sister        Only one Kidney  . Arthritis Sister    . Heart disease Daughter    Past Surgical History:  Procedure Laterality Date  . Bilateral cataract surgery  2009  . BUNIONECTOMY Right 2012  . CHOLECYSTECTOMY  12/17/2010   Procedure: LAPAROSCOPIC CHOLECYSTECTOMY;  Surgeon: Stacy Meyers;  Location: [facility];  Service: General;  Laterality: N/A;  . COLONOSCOPY  05/22/2011   Procedure: COLONOSCOPY;  Surgeon: Stacy Meyers;  Location: [facility];  Service: Endoscopy;  Laterality: N/A;  1045  . EYE SURGERY Bilateral    cataract  . GANGLION CYST EXCISION Left 06/02/2012   Procedure: Excision ganglion left dorsal wrist;  Surgeon: Stacy Meyers;  Location: [facility];  Service: Orthopedics;  Laterality: Left;  . L2-L3 discectomy  2002  . L3-L4, L4-L5 lumbar decompression  2011  . Right shoulder cyst excision  2010  . SPINE SURGERY     twice, approx 2006, 2011   Social History   Social History Narrative  . No narrative on file     Objective: Vital Signs: BP 134/60   Pulse 74   Resp 16   Ht 4\' 11"  (1.499 m)   Wt 130 lb (59 kg)   BMI 26.26 kg/m    Physical Exam  Constitutional: She is oriented to person, place, and time. She appears well-developed and well-nourished.  HENT:  Head: Normocephalic and atraumatic.  Eyes: Conjunctivae and EOM are normal.  Neck: Normal range of motion.  Cardiovascular: Normal rate, regular rhythm, normal heart sounds and intact distal pulses.   Pulmonary/Chest: Effort normal and breath sounds normal.  Abdominal: Soft. Bowel sounds are normal.  Lymphadenopathy:    She has no cervical adenopathy.  Neurological: She is alert and oriented to person, place, and time.  Skin: Skin is warm and dry. Capillary refill takes less than 2 seconds.  Psychiatric: She has a normal mood and affect. Her behavior is normal.  Nursing note and vitals reviewed.    Musculoskeletal Exam: C-spine good range of motion. Thoracic kyphosis noted. She has limited range of motion of her lumbar spine. Shoulder joints  elbow joints are good range of motion. She has ulnar deviation of all MCP joints with synovial thickening but no synovitis was noted. She has valgus deformity of her right knee joint with synovial thickening without effusion. She has mild valgus deformity in her left knee joint. Ankle joint synovial thickening was noted.  CDAI matern Exam: CDAI Homunculus Exam:   Tenderness:  RLE: tibiofemoral  Joint Counts:  CDAI Tender Joint count: 1 CDAI Swollen Joint count: 0  Global Assessments:  Patient Global Assessment: 3 Provider Global Assessment: 3  CDAI Calculated Score: 7    Investigation: No additional findings.TB Skin test: Negative in 05/2015 CBC Latest Ref Rng & Units 07/14/2016 06/04/2016 03/12/2016  WBC 3.8 - 10.8 K/uL 6.7 8.2 6.8  Hemoglobin 11.7 - 15.5 g/dL 13.5 12.6 12.9  Hematocrit 35.0 - 45.0 % 40.5 39.4 39.3  Platelets 140 - 400 K/uL 258 273 242   CMP Latest Ref Rng & Units 07/14/2016 06/04/2016 05/25/2016  Glucose 65 - 99 mg/dL 92 105(H) -  BUN 7 - 25 mg/dL 21 27(H) -  Creatinine 0.60 - 0.88 mg/dL 0.93(H) 1.06(H) 1.10(H)  Sodium  135 - 146 mmol/L 140 144 -  Potassium 3.5 - 5.3 mmol/L 4.5 3.8 -  Chloride 98 - 110 mmol/L 106 108 -  CO2 20 - 31 mmol/L 21 26 -  Calcium 8.6 - 10.4 mg/dL 9.9 10.2 -  Total Protein 6.1 - 8.1 g/dL 6.6 6.2 -  Total Bilirubin 0.2 - 1.2 mg/dL 0.5 0.3 -  Alkaline Phos 33 - 130 U/L 57 59 -  AST 10 - 35 U/L 16 12 -  ALT 6 - 29 U/L 9 8 -    Imaging: No results found.  Speciality Comments: No specialty comments available.    Procedures:  No procedures performed Allergies: Aspirin and Sudafed [pseudoephedrine hcl]   Assessment / Plan:     Visit Diagnoses: Rheumatoid arthritis with rheumatoid factor of multiple sites without organ or systems involvement ([place]) - Positive RF, positive anti-CCP, erosive disease with contractures. She is  on prednisone 5 mg by mouth daily for multiple months. As she had no synovitis today I discussed tapering her  prednisone down to 4 mg by mouth daily she was hesitant initially when she is agreed to taper. Given her prescription for prednisone 1 mg tablets she will take 4 tablets by mouth every morning until follow-up visit.   High risk medication use - MTX 3 tabs po qwk, folic acid1mg  po qd, prednisone 5 mg po qd. Her labs are stable. We will continue to monitor labs every 3 months.  Primary osteoarthritis of both shoulders: She has some limitation with range of motion  Rheumatoid arthritis involving both hands : She has no active synovitis today.  Rheumatoid arthritis involving both knees: She has valgus deformity in her knee joints more in the right than the left. She has discomfort walking.  Rheumatoid arthritis involving both feet : Chronic changes  DDD (degenerative disc disease), lumbar - L-spine surgery: Chronic pain  Age-related osteoporosis without current pathological fracture - Fosamax , Vit D per PCP   Her other medical problems are listed as follows:  History of hypertension  History of hypercholesterolemia  History of diabetes mellitus  Herpes ocular  History of abdominal hernia  History of bilateral cataract extraction    Orders: No orders of the defined types were placed in this encounter.  Meds ordered this encounter  Medications  . predniSONE (DELTASONE) 1 MG tablet    Sig: Take 4 tablets (4 mg total) by mouth daily with breakfast.    Dispense:  360 tablet    Refill:  0      Follow-Up Instructions: Return in about 5 months (around 02/21/2017) for Rheumatoid arthritis.   Bo Merino, Meyers  Note - This record has been created using [employer].  Chart creation errors have been sought, but may not always  have been located. Such creation errors do not reflect on  the standard of medical care.

## 2016-09-21 ENCOUNTER — Other Ambulatory Visit: Payer: Self-pay | Admitting: Family Medicine

## 2016-09-21 ENCOUNTER — Encounter: Payer: Self-pay | Admitting: Rheumatology

## 2016-09-21 ENCOUNTER — Ambulatory Visit (INDEPENDENT_AMBULATORY_CARE_PROVIDER_SITE_OTHER): Payer: Medicare Other | Admitting: Rheumatology

## 2016-09-21 VITALS — BP 134/60 | HR 74 | Resp 16 | Ht 59.0 in | Wt 130.0 lb

## 2016-09-21 DIAGNOSIS — M19011 Primary osteoarthritis, right shoulder: Secondary | ICD-10-CM

## 2016-09-21 DIAGNOSIS — Z9842 Cataract extraction status, left eye: Secondary | ICD-10-CM

## 2016-09-21 DIAGNOSIS — M05771 Rheumatoid arthritis with rheumatoid factor of right ankle and foot without organ or systems involvement: Secondary | ICD-10-CM

## 2016-09-21 DIAGNOSIS — M19012 Primary osteoarthritis, left shoulder: Secondary | ICD-10-CM

## 2016-09-21 DIAGNOSIS — Z9841 Cataract extraction status, right eye: Secondary | ICD-10-CM

## 2016-09-21 DIAGNOSIS — Z8679 Personal history of other diseases of the circulatory system: Secondary | ICD-10-CM

## 2016-09-21 DIAGNOSIS — M05772 Rheumatoid arthritis with rheumatoid factor of left ankle and foot without organ or systems involvement: Secondary | ICD-10-CM

## 2016-09-21 DIAGNOSIS — M5136 Other intervertebral disc degeneration, lumbar region: Secondary | ICD-10-CM

## 2016-09-21 DIAGNOSIS — M05741 Rheumatoid arthritis with rheumatoid factor of right hand without organ or systems involvement: Secondary | ICD-10-CM | POA: Diagnosis not present

## 2016-09-21 DIAGNOSIS — Z79899 Other long term (current) drug therapy: Secondary | ICD-10-CM | POA: Diagnosis not present

## 2016-09-21 DIAGNOSIS — B0230 Zoster ocular disease, unspecified: Secondary | ICD-10-CM

## 2016-09-21 DIAGNOSIS — Z8719 Personal history of other diseases of the digestive system: Secondary | ICD-10-CM

## 2016-09-21 DIAGNOSIS — N182 Chronic kidney disease, stage 2 (mild): Secondary | ICD-10-CM | POA: Diagnosis not present

## 2016-09-21 DIAGNOSIS — Z8639 Personal history of other endocrine, nutritional and metabolic disease: Secondary | ICD-10-CM

## 2016-09-21 DIAGNOSIS — M05761 Rheumatoid arthritis with rheumatoid factor of right knee without organ or systems involvement: Secondary | ICD-10-CM | POA: Diagnosis not present

## 2016-09-21 DIAGNOSIS — M810 Age-related osteoporosis without current pathological fracture: Secondary | ICD-10-CM

## 2016-09-21 DIAGNOSIS — M069 Rheumatoid arthritis, unspecified: Secondary | ICD-10-CM | POA: Diagnosis not present

## 2016-09-21 DIAGNOSIS — M05762 Rheumatoid arthritis with rheumatoid factor of left knee without organ or systems involvement: Secondary | ICD-10-CM

## 2016-09-21 DIAGNOSIS — M05742 Rheumatoid arthritis with rheumatoid factor of left hand without organ or systems involvement: Secondary | ICD-10-CM

## 2016-09-21 DIAGNOSIS — M0579 Rheumatoid arthritis with rheumatoid factor of multiple sites without organ or systems involvement: Secondary | ICD-10-CM

## 2016-09-21 DIAGNOSIS — I2510 Atherosclerotic heart disease of native coronary artery without angina pectoris: Secondary | ICD-10-CM

## 2016-09-21 DIAGNOSIS — I10 Essential (primary) hypertension: Secondary | ICD-10-CM | POA: Diagnosis not present

## 2016-09-21 MED ORDER — PREDNISONE 1 MG PO TABS: 4.0000 mg | ORAL_TABLET | Freq: Every day | ORAL | 0 refills | Status: DC

## 2016-09-21 NOTE — Telephone Encounter (Signed)
Seen 5 2 18

## 2016-09-21 NOTE — Patient Instructions (Signed)
Decrease prednisone to 4mg  per day, let us know if you have any problems

## 2016-09-23 ENCOUNTER — Other Ambulatory Visit: Payer: Self-pay | Admitting: Family Medicine

## 2016-09-23 NOTE — Telephone Encounter (Signed)
Seen 5 2 18

## 2016-10-02 DIAGNOSIS — R7303 Prediabetes: Secondary | ICD-10-CM | POA: Diagnosis not present

## 2016-10-03 LAB — BASIC METABOLIC PANEL WITH GFR
BUN / CREAT RATIO: 20 (calc) (ref 6–22)
BUN: 19 mg/dL (ref 7–25)
CHLORIDE: 104 mmol/L (ref 98–110)
CO2: 28 mmol/L (ref 20–32)
Calcium: 10.1 mg/dL (ref 8.6–10.4)
Creat: 0.93 mg/dL — ABNORMAL HIGH (ref 0.60–0.88)
GFR, EST NON AFRICAN AMERICAN: 57 mL/min/{1.73_m2} — AB (ref >=60)
GFR, Est African American: 66 mL/min/{1.73_m2} (ref >=60)
Glucose, Bld: 112 mg/dL — ABNORMAL HIGH (ref 65–99)
POTASSIUM: 3.9 mmol/L (ref 3.5–5.3)
Sodium: 141 mmol/L (ref 135–146)

## 2016-10-03 LAB — HEMOGLOBIN A1C
HEMOGLOBIN A1C: 6.2 %{Hb} — AB (ref <5.7)
Mean Plasma Glucose: 131 (calc)
eAG (mmol/L): 7.3 (calc)

## 2016-10-07 ENCOUNTER — Ambulatory Visit (INDEPENDENT_AMBULATORY_CARE_PROVIDER_SITE_OTHER): Payer: Medicare Other | Admitting: Family Medicine

## 2016-10-07 ENCOUNTER — Encounter: Payer: Self-pay | Admitting: Family Medicine

## 2016-10-07 VITALS — BP 157/80 | HR 58 | Temp 97.1°F | Resp 16 | Ht 59.0 in | Wt 135.5 lb

## 2016-10-07 DIAGNOSIS — E559 Vitamin D deficiency, unspecified: Secondary | ICD-10-CM

## 2016-10-07 DIAGNOSIS — E785 Hyperlipidemia, unspecified: Secondary | ICD-10-CM

## 2016-10-07 DIAGNOSIS — E119 Type 2 diabetes mellitus without complications: Secondary | ICD-10-CM | POA: Diagnosis not present

## 2016-10-07 DIAGNOSIS — E1169 Type 2 diabetes mellitus with other specified complication: Secondary | ICD-10-CM

## 2016-10-07 DIAGNOSIS — R7989 Other specified abnormal findings of blood chemistry: Secondary | ICD-10-CM

## 2016-10-07 DIAGNOSIS — M0579 Rheumatoid arthritis with rheumatoid factor of multiple sites without organ or systems involvement: Secondary | ICD-10-CM

## 2016-10-07 DIAGNOSIS — B009 Herpesviral infection, unspecified: Secondary | ICD-10-CM | POA: Diagnosis not present

## 2016-10-07 DIAGNOSIS — E669 Obesity, unspecified: Secondary | ICD-10-CM | POA: Diagnosis not present

## 2016-10-07 DIAGNOSIS — I2510 Atherosclerotic heart disease of native coronary artery without angina pectoris: Secondary | ICD-10-CM

## 2016-10-07 DIAGNOSIS — I10 Essential (primary) hypertension: Secondary | ICD-10-CM | POA: Diagnosis not present

## 2016-10-07 MED ORDER — AMLODIPINE BESYLATE 5 MG PO TABS: 5.0000 mg | ORAL_TABLET | Freq: Every day | ORAL | 4 refills | Status: DC

## 2016-10-07 NOTE — Telephone Encounter (Signed)
Issue resolved.

## 2016-10-07 NOTE — Patient Instructions (Addendum)
F/u in 3 months, 2nd week in January call if you need me sooner  Nurse BP re check first week in November  Microalbumin today  Foot exam today qualifies you for diabetic shoes   You are referred to Podiatrist in [place] for foot and toenail care  New is once daily terbinafine for fungus in toenails for 12 weeks  Blood pressure is high, INCREASE in amlodipine to 5 mg one daily  Fasting lipid, cmp and EGFr, vit D first week in [place]  Excellent blood sugar, without medication , keep this up  [facility] check on [facility] for [facility]  Thank you  for choosing [facility]. We consider it a privelige to serve you.  Delivering excellent health care in a caring and  compassionate way is our goal.  Partnering with you,  so that together we can achieve this goal is our strategy.

## 2016-10-08 LAB — MICROALBUMIN / CREATININE URINE RATIO
Creatinine, Urine: 68 mg/dL (ref 20–275)
Microalb Creat Ratio: 12 mcg/mg creat (ref <30)
Microalb, Ur: 0.8 mg/dL

## 2016-10-11 ENCOUNTER — Encounter: Payer: Self-pay | Admitting: Family Medicine

## 2016-10-11 NOTE — Assessment & Plan Note (Addendum)
Has noted flare in joint pain  with recent reduction in prednisone dose however is trying to see if she is able to tolerate this

## 2016-10-11 NOTE — Assessment & Plan Note (Signed)
Hyperlipidemia:Low fat diet discussed and encouraged.   Lipid Panel  Lab Results  Component Value Date   CHOL 171 02/24/2016   HDL 63 02/24/2016   LDLCALC 82 02/24/2016   TRIG 130 02/24/2016   CHOLHDL 2.7 02/24/2016   Updated lab needed at/ before next visit.

## 2016-10-11 NOTE — Assessment & Plan Note (Signed)
Stacy Meyers is reminded of the importance of commitment to daily physical activity for 30 minutes or more, as able and the need to limit carbohydrate intake to 30 to 60 grams per meal to help with blood sugar control.   Stacy Meyers is reminded of the importance of daily foot exam, annual eye examination, and good blood sugar, blood pressure and cholesterol control.  Diabetic Labs Latest Ref Rng & Units 10/07/2016 10/02/2016 07/14/2016 06/04/2016 05/25/2016  HbA1c <5.7 % of total Hgb - 6.2(H) - - -  Microalbumin mg/dL 0.8 - - - -  Micro/Creat Ratio <30 mcg/mg creat 12 - - - -  Chol <200 mg/dL - - - - -  HDL >50 mg/dL - - - - -  Calc LDL <100 mg/dL - - - - -  Triglycerides <150 mg/dL - - - - -  Creatinine 0.60 - 0.88 mg/dL - 0.93(H) 0.93(H) 1.06(H) 1.10(H)   BP/Weight 10/07/2016 09/21/2016 07/14/2016 06/17/2016 06/04/2016 09/07/7900 4/0/9735  Systolic BP 329 924 - 268 341 962 229  Diastolic BP 80 60 - 70 50 72 72  Wt. (Lbs) 135.5 130 133 132 134 133 133  BMI 27.37 26.26 26.86 26.21 24.51 24.33 24.33   Foot/eye exam completion dates 10/07/2016  Foot Form Completion Done

## 2016-10-11 NOTE — Progress Notes (Signed)
Stacy Meyers     MRN: [id number]      DOB: 08-21-1932   HPI Stacy Meyers is here for follow up and re-evaluation of chronic medical conditions, medication management and review of any available recent lab and radiology data.  Preventive health is updated, specifically  Cancer screening and Immunization.   Questions or concerns regarding consultations or procedures which the PT has had in the interim are  addressed. The PT denies any adverse reactions to current medications since the last visit.  There are no new concerns.  There are no specific complaints   ROS Denies recent fever or chills. Denies sinus pressure, nasal congestion, ear pain or sore throat. Denies chest congestion, productive cough or wheezing. Denies chest pains, palpitations and leg swelling Denies abdominal pain, nausea, vomiting,diarrhea or constipation.   Denies dysuria, frequency, hesitancy or incontinence. C/o  joint pain, swelling and limitation in mobility. Denies headaches, seizures, numbness, or tingling. Denies depression, anxiety or insomnia. Denies skin break down or rash.   PE  BP (!) 157/80 (BP Location: Left Arm, Patient Position: Sitting, Cuff Size: Normal)   Pulse (!) 58   Temp (!) 97.1 F (36.2 C) (Other (Comment))   Resp 16   Ht 4\' 11"  (1.499 m)   Wt 135 lb 8 oz (61.5 kg)   SpO2 97%   BMI 27.37 kg/m   Patient alert and oriented and in no cardiopulmonary distress.  HEENT: No facial asymmetry, EOMI,   oropharynx pink and moist.  Neck supple no JVD, no mass.  Chest: Clear to auscultation bilaterally.  CVS: S1, S2 no murmurs, no S3.Regular rate.  ABD: Soft non tender.   Ext: No edema  MS: Decreased ROM spine, shoulders, hips and knees.Marked deformity of joints in hands  Skin: Intact, no ulcerations or rash noted.  Psych: Good eye contact, normal affect. Memory intact not anxious or depressed appearing.  CNS: CN 2-12 intact, power,  normal throughout.no focal deficits  noted.   Assessment & Plan Essential hypertension, benign Uncontrolled Recheck in 5 weeks DASH diet and commitment to daily physical activity for a minimum of 30 minutes discussed and encouraged, as a part of hypertension management. The importance of attaining a healthy weight is also discussed.  BP/Weight 10/07/2016 09/21/2016 07/14/2016 06/17/2016 06/04/2016 03/09/3297 02/08/2681  Systolic BP 419 622 - 297 989 211 941  Diastolic BP 80 60 - 70 50 72 72  Wt. (Lbs) 135.5 130 133 132 134 133 133  BMI 27.37 26.26 26.86 26.21 24.51 24.33 24.33       Hyperlipidemia LDL goal <100 Hyperlipidemia:Low fat diet discussed and encouraged.   Lipid Panel  Lab Results  Component Value Date   CHOL 171 02/24/2016   HDL 63 02/24/2016   LDLCALC 82 02/24/2016   TRIG 130 02/24/2016   CHOLHDL 2.7 02/24/2016   Updated lab needed at/ before next visit.     Rheumatoid arthritis with rheumatoid factor of multiple sites without organ or systems involvement ([place]) Has noted flare in joint pain  with recent reduction in prednisone dose however is trying to see if she is able to tolerate this   Herpes simplex virus (HSV) infection Maintained on chronic antiviral therapy as she is on methotrexate for her rheumatoid disease  Type 2 diabetes, diet controlled ([place]) Stacy Meyers is reminded of the importance of commitment to daily physical activity for 30 minutes or more, as able and the need to limit carbohydrate intake to 30 to 60 grams per meal to  help with blood sugar control.   Stacy Meyers is reminded of the importance of daily foot exam, annual eye examination, and good blood sugar, blood pressure and cholesterol control.  Diabetic Labs Latest Ref Rng & Units 10/07/2016 10/02/2016 07/14/2016 06/04/2016 05/25/2016  HbA1c <5.7 % of total Hgb - 6.2(H) - - -  Microalbumin mg/dL 0.8 - - - -  Micro/Creat Ratio <30 mcg/mg creat 12 - - - -  Chol <200 mg/dL - - - - -  HDL >50 mg/dL - - - - -  Calc LDL <100 mg/dL - - - -  -  Triglycerides <150 mg/dL - - - - -  Creatinine 0.60 - 0.88 mg/dL - 0.93(H) 0.93(H) 1.06(H) 1.10(H)   BP/Weight 10/07/2016 09/21/2016 07/14/2016 06/17/2016 06/04/2016 05/12/8467 06/07/9526  Systolic BP 413 244 - 010 272 536 644  Diastolic BP 80 60 - 70 50 72 72  Wt. (Lbs) 135.5 130 133 132 134 133 133  BMI 27.37 26.26 26.86 26.21 24.51 24.33 24.33   Foot/eye exam completion dates 10/07/2016  Foot Form Completion Done

## 2016-10-11 NOTE — Assessment & Plan Note (Signed)
Uncontrolled Recheck in 5 weeks DASH diet and commitment to daily physical activity for a minimum of 30 minutes discussed and encouraged, as a part of hypertension management. The importance of attaining a healthy weight is also discussed.  BP/Weight 10/07/2016 09/21/2016 07/14/2016 06/17/2016 06/04/2016 02/07/4142 03/11/163  Systolic BP 800 634 - 949 447 395 844  Diastolic BP 80 60 - 70 50 72 72  Wt. (Lbs) 135.5 130 133 132 134 133 133  BMI 27.37 26.26 26.86 26.21 24.51 24.33 24.33

## 2016-10-11 NOTE — Assessment & Plan Note (Signed)
Maintained on chronic antiviral therapy as she is on methotrexate for her rheumatoid disease

## 2016-10-13 ENCOUNTER — Telehealth: Payer: Self-pay | Admitting: Family Medicine

## 2016-10-13 MED ORDER — UNABLE TO FIND: 0 refills | Status: DC

## 2016-10-13 NOTE — Telephone Encounter (Signed)
Patient is requesting a prescription for diabetic shoes be sent to [facility]

## 2016-10-13 NOTE — Telephone Encounter (Signed)
OK to send scrip[t, pls print diabnetic foot exam from recent visit which qualifies her

## 2016-10-23 ENCOUNTER — Telehealth: Payer: Self-pay | Admitting: Family Medicine

## 2016-10-23 NOTE — Telephone Encounter (Signed)
Patient calling re diabetic shoes, would like to know the status.

## 2016-10-26 NOTE — Telephone Encounter (Signed)
Sent!

## 2016-11-02 DIAGNOSIS — M79675 Pain in left toe(s): Secondary | ICD-10-CM | POA: Diagnosis not present

## 2016-11-02 DIAGNOSIS — I739 Peripheral vascular disease, unspecified: Secondary | ICD-10-CM | POA: Diagnosis not present

## 2016-11-02 DIAGNOSIS — B351 Tinea unguium: Secondary | ICD-10-CM | POA: Diagnosis not present

## 2016-11-02 DIAGNOSIS — M79674 Pain in right toe(s): Secondary | ICD-10-CM | POA: Diagnosis not present

## 2016-11-02 DIAGNOSIS — E1151 Type 2 diabetes mellitus with diabetic peripheral angiopathy without gangrene: Secondary | ICD-10-CM | POA: Diagnosis not present

## 2016-11-05 ENCOUNTER — Ambulatory Visit: Payer: Medicare Other

## 2016-11-09 MED ORDER — UNABLE TO FIND: 0 refills | Status: DC

## 2016-11-21 ENCOUNTER — Other Ambulatory Visit: Payer: Self-pay | Admitting: Rheumatology

## 2016-11-23 NOTE — Telephone Encounter (Addendum)
Last Visit: 09/21/16 Next Visit: 02/24/17 Labs: 07/14/16 stable   Left message to advise patient she is due for labs  Okay to refill 30 day supply per Dr. Estanislado Pandy

## 2016-11-25 ENCOUNTER — Other Ambulatory Visit: Payer: Self-pay | Admitting: Rheumatology

## 2016-11-29 ENCOUNTER — Other Ambulatory Visit: Payer: Self-pay | Admitting: Family Medicine

## 2016-11-30 ENCOUNTER — Other Ambulatory Visit: Payer: Self-pay | Admitting: *Deleted

## 2016-11-30 DIAGNOSIS — Z79899 Other long term (current) drug therapy: Secondary | ICD-10-CM

## 2016-11-30 LAB — COMPLETE METABOLIC PANEL WITH GFR
AG Ratio: 1.6 (calc) (ref 1.0–2.5)
ALBUMIN MSPROF: 4.1 g/dL (ref 3.6–5.1)
ALT: 7 U/L (ref 6–29)
AST: 14 U/L (ref 10–35)
Alkaline phosphatase (APISO): 55 U/L (ref 33–130)
BILIRUBIN TOTAL: 0.5 mg/dL (ref 0.2–1.2)
BUN / CREAT RATIO: 30 (calc) — AB (ref 6–22)
BUN: 29 mg/dL — AB (ref 7–25)
CO2: 30 mmol/L (ref 20–32)
CREATININE: 0.97 mg/dL — AB (ref 0.60–0.88)
Calcium: 10.5 mg/dL — ABNORMAL HIGH (ref 8.6–10.4)
Chloride: 104 mmol/L (ref 98–110)
GFR, EST NON AFRICAN AMERICAN: 54 mL/min/{1.73_m2} — AB (ref >=60)
GFR, Est African American: 63 mL/min/{1.73_m2} (ref >=60)
GLUCOSE: 125 mg/dL (ref 65–139)
Globulin: 2.5 g/dL (calc) (ref 1.9–3.7)
Potassium: 3.9 mmol/L (ref 3.5–5.3)
Sodium: 141 mmol/L (ref 135–146)
TOTAL PROTEIN: 6.6 g/dL (ref 6.1–8.1)

## 2016-11-30 LAB — CBC WITH DIFFERENTIAL/PLATELET
BASOS ABS: 41 {cells}/uL (ref 0–200)
Basophils Relative: 0.6 %
EOS ABS: 102 {cells}/uL (ref 15–500)
Eosinophils Relative: 1.5 %
HEMATOCRIT: 36.9 % (ref 35.0–45.0)
Hemoglobin: 12.7 g/dL (ref 11.7–15.5)
LYMPHS ABS: 1924 {cells}/uL (ref 850–3900)
MCH: 31.5 pg (ref 27.0–33.0)
MCHC: 34.4 g/dL (ref 32.0–36.0)
MCV: 91.6 fL (ref 80.0–100.0)
MPV: 8.7 fL (ref 7.5–12.5)
Monocytes Relative: 5.9 %
NEUTROS PCT: 63.7 %
Neutro Abs: 4332 cells/uL (ref 1500–7800)
Platelets: 279 10*3/uL (ref 140–400)
RBC: 4.03 10*6/uL (ref 3.80–5.10)
RDW: 13.2 % (ref 11.0–15.0)
Total Lymphocyte: 28.3 %
WBC: 6.8 10*3/uL (ref 3.8–10.8)
WBCMIX: 401 {cells}/uL (ref 200–950)

## 2016-11-30 NOTE — Progress Notes (Signed)
Labs are stable.

## 2016-12-18 ENCOUNTER — Other Ambulatory Visit: Payer: Self-pay | Admitting: Family Medicine

## 2016-12-19 ENCOUNTER — Other Ambulatory Visit: Payer: Self-pay | Admitting: Rheumatology

## 2016-12-21 NOTE — Telephone Encounter (Signed)
Last Visit: 09/21/16 Next Visit: 02/24/17  Okay to refill per Dr. Estanislado Pandy

## 2016-12-22 ENCOUNTER — Other Ambulatory Visit: Payer: Self-pay | Admitting: Rheumatology

## 2016-12-22 NOTE — Telephone Encounter (Signed)
Last Visit: 09/21/16 Next Visit: 02/24/17 Labs: 11/30/16 Stable  Okay to refill per Dr. Estanislado Pandy

## 2017-01-07 DIAGNOSIS — M0579 Rheumatoid arthritis with rheumatoid factor of multiple sites without organ or systems involvement: Secondary | ICD-10-CM | POA: Diagnosis not present

## 2017-01-07 DIAGNOSIS — E1169 Type 2 diabetes mellitus with other specified complication: Secondary | ICD-10-CM | POA: Diagnosis not present

## 2017-01-07 DIAGNOSIS — R7989 Other specified abnormal findings of blood chemistry: Secondary | ICD-10-CM | POA: Diagnosis not present

## 2017-01-07 DIAGNOSIS — I10 Essential (primary) hypertension: Secondary | ICD-10-CM | POA: Diagnosis not present

## 2017-01-07 DIAGNOSIS — E559 Vitamin D deficiency, unspecified: Secondary | ICD-10-CM | POA: Diagnosis not present

## 2017-01-07 DIAGNOSIS — E785 Hyperlipidemia, unspecified: Secondary | ICD-10-CM | POA: Diagnosis not present

## 2017-01-07 DIAGNOSIS — E669 Obesity, unspecified: Secondary | ICD-10-CM | POA: Diagnosis not present

## 2017-01-08 LAB — COMPLETE METABOLIC PANEL WITH GFR
AG Ratio: 2 (calc) (ref 1.0–2.5)
ALBUMIN MSPROF: 4.3 g/dL (ref 3.6–5.1)
ALT: 8 U/L (ref 6–29)
AST: 15 U/L (ref 10–35)
Alkaline phosphatase (APISO): 52 U/L (ref 33–130)
BUN / CREAT RATIO: 28 (calc) — AB (ref 6–22)
BUN: 25 mg/dL (ref 7–25)
CALCIUM: 10 mg/dL (ref 8.6–10.4)
CO2: 27 mmol/L (ref 20–32)
CREATININE: 0.89 mg/dL — AB (ref 0.60–0.88)
Chloride: 105 mmol/L (ref 98–110)
GFR, EST NON AFRICAN AMERICAN: 60 mL/min/{1.73_m2} (ref >=60)
GFR, Est African American: 70 mL/min/{1.73_m2} (ref >=60)
GLOBULIN: 2.2 g/dL (ref 1.9–3.7)
Glucose, Bld: 104 mg/dL — ABNORMAL HIGH (ref 65–99)
Potassium: 4.1 mmol/L (ref 3.5–5.3)
SODIUM: 139 mmol/L (ref 135–146)
Total Bilirubin: 0.5 mg/dL (ref 0.2–1.2)
Total Protein: 6.5 g/dL (ref 6.1–8.1)

## 2017-01-08 LAB — LIPID PANEL
CHOLESTEROL: 166 mg/dL (ref <200)
HDL: 66 mg/dL (ref >50)
LDL Cholesterol (Calc): 83 mg/dL (calc)
Non-HDL Cholesterol (Calc): 100 mg/dL (calc) (ref <130)
Total CHOL/HDL Ratio: 2.5 (calc) (ref <5.0)
Triglycerides: 83 mg/dL (ref <150)

## 2017-01-08 LAB — VITAMIN D 25 HYDROXY (VIT D DEFICIENCY, FRACTURES): VIT D 25 HYDROXY: 43 ng/mL (ref 30–100)

## 2017-01-13 ENCOUNTER — Ambulatory Visit (INDEPENDENT_AMBULATORY_CARE_PROVIDER_SITE_OTHER): Payer: Medicare Other | Admitting: Family Medicine

## 2017-01-13 ENCOUNTER — Other Ambulatory Visit: Payer: Self-pay | Admitting: Family Medicine

## 2017-01-13 VITALS — BP 140/80 | HR 78 | Resp 14 | Ht 59.0 in | Wt 135.0 lb

## 2017-01-13 DIAGNOSIS — I519 Heart disease, unspecified: Secondary | ICD-10-CM

## 2017-01-13 DIAGNOSIS — I10 Essential (primary) hypertension: Secondary | ICD-10-CM

## 2017-01-13 DIAGNOSIS — E119 Type 2 diabetes mellitus without complications: Secondary | ICD-10-CM

## 2017-01-13 DIAGNOSIS — I5189 Other ill-defined heart diseases: Secondary | ICD-10-CM

## 2017-01-13 DIAGNOSIS — E785 Hyperlipidemia, unspecified: Secondary | ICD-10-CM | POA: Diagnosis not present

## 2017-01-13 DIAGNOSIS — Z1231 Encounter for screening mammogram for malignant neoplasm of breast: Secondary | ICD-10-CM

## 2017-01-13 MED ORDER — ERGOCALCIFEROL 1.25 MG (50000 UT) PO CAPS: 50000.0000 [IU] | ORAL_CAPSULE | ORAL | 3 refills | Status: DC

## 2017-01-13 NOTE — Patient Instructions (Addendum)
F/u end August or early Sepptember, with rectal, call if you need me before  Wellness with nurse mid May   HBA1C, fasting lipid, cmp and EGFr 5 days before August with MD so please collect at May visit or call in July for labs    You need mammogram in May, please schedule  I will refer you to Dr Harl Bowie in [place] office, to check your heart in June , when it is due  Be careful not to fall, and handicap sticker today  Thank you  for choosing [facility]. We consider it a privelige to serve you.  Delivering excellent health care in a caring and  compassionate way is our goal.  Partnering with you,  so that together we can achieve this goal is our strategy.   All the best 2019!

## 2017-01-22 ENCOUNTER — Encounter: Payer: Self-pay | Admitting: Family Medicine

## 2017-01-22 NOTE — Assessment & Plan Note (Signed)
Cardiology f/u due in June will refer

## 2017-01-22 NOTE — Progress Notes (Signed)
Stacy Meyers     MRN: [id number]      DOB: 10-28-1932   HPI Stacy Meyers is here for follow up and re-evaluation of chronic medical conditions, medication management and review of any available recent lab and radiology data.  Preventive health is updated, specifically  Cancer screening and Immunization.    The PT denies any adverse reactions to current medications since the last visit.  There are no new concerns.  There are no specific complaints   ROS Denies recent fever or chills. Denies sinus pressure, nasal congestion, ear pain or sore throat. Denies chest congestion, productive cough or wheezing. Denies chest pains, palpitations and leg swelling Denies abdominal pain, nausea, vomiting,diarrhea or constipation.   Denies dysuria, frequency, hesitancy or incontinence. C/o chronic  joint pain, swelling and limitation in mobility. Denies headaches, seizures, numbness, or tingling. Denies depression, anxiety or insomnia. Denies skin break down or rash.   PE BP 14/80, Pulse 78, weight 135 lb BMI 27.27, Resp 14   Patient alert and oriented and in no cardiopulmonary distress.  HEENT: No facial asymmetry, EOMI,   oropharynx pink and moist.  Neck decreased though adequate ROM, no JVD, no mass.  Chest: Clear to auscultation bilaterally.  CVS: S1, S2 no murmurs, no S3.Regular rate.  ABD: Soft non tender.   Ext: No edema  MS: Decreased  ROM spine, shoulders, hips and knees.  Skin: Intact, no ulcerations or rash noted.  Psych: Good eye contact, normal affect. Memory intact not anxious or depressed appearing.  CNS: CN 2-12 intact, power,  normal throughout.no focal deficits noted.   Assessment & Plan  Essential hypertension, benign DASH diet and commitment to daily physical activity for a minimum of 30 minutes discussed and encouraged, as a part of hypertension management. The importance of attaining a healthy weight is also discussed.  BP/Weight 01/13/2017 10/07/2016  09/21/2016 07/14/2016 06/17/2016 9/47/0962 08/08/6627  Systolic BP 476 546 503 - 546 568 127  Diastolic BP 80 80 60 - 70 50 72  Wt. (Lbs) 135 135.5 130 133 132 134 133  BMI 27.27 27.37 26.26 26.86 26.21 24.51 24.33          Type 2 diabetes, diet controlled ([place]) Stacy Meyers is reminded of the importance of commitment to daily physical activity for 30 minutes or more, as able and the need to limit carbohydrate intake to 30 to 60 grams per meal to help with blood sugar control.  Updated lab needed at/ before next visit.     Stacy Meyers is reminded of the importance of daily foot exam, annual eye examination, and good blood sugar, blood pressure and cholesterol control.  Diabetic Labs Latest Ref Rng & Units 01/07/2017 11/30/2016 10/07/2016 10/02/2016 07/14/2016  HbA1c <5.7 % of total Hgb - - - 6.2(H) -  Microalbumin mg/dL - - 0.8 - -  Micro/Creat Ratio <30 mcg/mg creat - - 12 - -  Chol <200 mg/dL 166 - - - -  HDL >50 mg/dL 66 - - - -  Calc LDL <100 mg/dL - - - - -  Triglycerides <150 mg/dL 83 - - - -  Creatinine 0.60 - 0.88 mg/dL 0.89(H) 0.97(H) - 0.93(H) 0.93(H)   BP/Weight 01/13/2017 10/07/2016 09/21/2016 07/14/2016 06/17/2016 05/22/15 04/14/4494  Systolic BP 759 163 846 - 659 935 701  Diastolic BP 80 80 60 - 70 50 72  Wt. (Lbs) 135 135.5 130 133 132 134 133  BMI 27.27 27.37 26.26 26.86 26.21 24.51 24.33   Foot/eye exam  completion dates Latest Ref Rng & Units 10/07/2016 04/27/2016  Eye Exam No Retinopathy - No Retinopathy  Foot Form Completion - Done -        Diastolic dysfunction Cardiology f/u due in June will refer   Hyperlipidemia LDL goal <100 Hyperlipidemia:Low fat diet discussed and encouraged.   Lipid Panel  Lab Results  Component Value Date   CHOL 166 01/07/2017   HDL 66 01/07/2017   LDLCALC 82 02/24/2016   TRIG 83 01/07/2017   CHOLHDL 2.5 01/07/2017   Controlled, no change in medication

## 2017-01-22 NOTE — Assessment & Plan Note (Addendum)
DASH diet and commitment to daily physical activity for a minimum of 30 minutes discussed and encouraged, as a part of hypertension management. The importance of attaining a healthy weight is also discussed.  BP/Weight 01/13/2017 10/07/2016 09/21/2016 07/14/2016 06/17/2016 1/44/3154 0/0/8676  Systolic BP 195 093 267 - 124 580 998  Diastolic BP 80 80 60 - 70 50 72  Wt. (Lbs) 135 135.5 130 133 132 134 133  BMI 27.27 27.37 26.26 26.86 26.21 24.51 24.33

## 2017-01-22 NOTE — Assessment & Plan Note (Signed)
Stacy Meyers is reminded of the importance of commitment to daily physical activity for 30 minutes or more, as able and the need to limit carbohydrate intake to 30 to 60 grams per meal to help with blood sugar control.  Updated lab needed at/ before next visit.     Stacy Meyers is reminded of the importance of daily foot exam, annual eye examination, and good blood sugar, blood pressure and cholesterol control.  Diabetic Labs Latest Ref Rng & Units 01/07/2017 11/30/2016 10/07/2016 10/02/2016 07/14/2016  HbA1c <5.7 % of total Hgb - - - 6.2(H) -  Microalbumin mg/dL - - 0.8 - -  Micro/Creat Ratio <30 mcg/mg creat - - 12 - -  Chol <200 mg/dL 166 - - - -  HDL >50 mg/dL 66 - - - -  Calc LDL <100 mg/dL - - - - -  Triglycerides <150 mg/dL 83 - - - -  Creatinine 0.60 - 0.88 mg/dL 0.89(H) 0.97(H) - 0.93(H) 0.93(H)   BP/Weight 01/13/2017 10/07/2016 09/21/2016 07/14/2016 06/17/2016 05/22/6158 07/07/7104  Systolic BP 269 485 462 - 703 500 938  Diastolic BP 80 80 60 - 70 50 72  Wt. (Lbs) 135 135.5 130 133 132 134 133  BMI 27.27 27.37 26.26 26.86 26.21 24.51 24.33   Foot/eye exam completion dates Latest Ref Rng & Units 10/07/2016 04/27/2016  Eye Exam No Retinopathy - No Retinopathy  Foot Form Completion - Done -

## 2017-01-22 NOTE — Assessment & Plan Note (Signed)
Hyperlipidemia:Low fat diet discussed and encouraged.   Lipid Panel  Lab Results  Component Value Date   CHOL 166 01/07/2017   HDL 66 01/07/2017   LDLCALC 82 02/24/2016   TRIG 83 01/07/2017   CHOLHDL 2.5 01/07/2017   Controlled, no change in medication

## 2017-02-05 ENCOUNTER — Other Ambulatory Visit: Payer: Self-pay | Admitting: Family Medicine

## 2017-02-10 NOTE — Progress Notes (Deleted)
Office Visit Note  Patient: Stacy Meyers             Date of Birth: December 04, 1932           MRN: [id number]             PCP: Fayrene Helper, MD Referring: Fayrene Helper, MD Visit Date: 02/24/2017 Occupation: @GUAROCC @    Subjective:  No chief complaint on file.   History of Present Illness: Stacy Meyers is a 84 y.o. female ***   Activities of Daily Living:  Patient reports morning stiffness for *** {minute/hour:19697}.   Patient {ACTIONS;DENIES/REPORTS:21021675::"Denies"} nocturnal pain.  Difficulty dressing/grooming: {ACTIONS;DENIES/REPORTS:21021675::"Denies"} Difficulty climbing stairs: {ACTIONS;DENIES/REPORTS:21021675::"Denies"} Difficulty getting out of chair: {ACTIONS;DENIES/REPORTS:21021675::"Denies"} Difficulty using hands for taps, buttons, cutlery, and/or writing: {ACTIONS;DENIES/REPORTS:21021675::"Denies"}   No Rheumatology ROS completed.   PMFS History:  Patient Active Problem List   Diagnosis Date Noted  . Fibroids 05/08/2016  . Spondylosis of lumbar region without myelopathy or radiculopathy 12/24/2015  . Solitary bone cyst of right shoulder 05/28/2015  . Right knee pain 05/28/2015  . Osteoarthritis of both shoulders 05/28/2015  . Ganglion cyst of wrist 05/11/2012  . Systolic murmur 55/73/2202  . Diastolic dysfunction 54/27/0623  . Seasonal allergies 08/28/2010  . Type 2 diabetes, diet controlled ([place]) 07/25/2009  . Herpes simplex virus (HSV) infection 09/16/2008  . Rheumatoid arthritis with rheumatoid factor of multiple sites without organ or systems involvement ([place]) 09/16/2008  . Hyperlipidemia LDL goal <100 05/23/2008  . Essential hypertension, benign 05/23/2008    Past Medical History:  Diagnosis Date  . Acute cholecystitis 12/16/2010  . Anemia 12/14/2010  . ARF (acute renal failure) ([place]) 12/14/2010  . Bradycardia    Sinus - noted 6/11  . Cholelithiasis 12/14/2010  . Collagen vascular disease ([place])   . Diastolic dysfunction 76/28/3151    Grade 1 per 2-D echocardiogram  . Dysrhythmia   . Essential hypertension, benign   . HTN (hypertension) 12/14/2010  . Hypercalcemia 12/14/2010  . Hypokalemia 12/14/2010  . Mixed hyperlipidemia   . Osteoarthritis   . Rheumatoid arthritis(714.0)     Family History  Problem Relation Age of Onset  . Stroke Mother   . Cancer Father   . Diabetes Sister   . Diabetes Sister   . Kidney failure Brother   . Kidney failure Brother   . Pancreatic cancer Sister   . Kidney Stones Sister        Only one Kidney  . Arthritis Sister   . Heart disease Daughter    Past Surgical History:  Procedure Laterality Date  . Bilateral cataract surgery  2009  . BUNIONECTOMY Right 2012  . CHOLECYSTECTOMY  12/17/2010   Procedure: LAPAROSCOPIC CHOLECYSTECTOMY;  Surgeon: Donato Heinz;  Location: [facility];  Service: General;  Laterality: N/A;  . COLONOSCOPY  05/22/2011   Procedure: COLONOSCOPY;  Surgeon: Rogene Houston, MD;  Location: [facility] SU[facility];  Service: Endoscopy;  Laterality: N/A;  1045  . EYE SURGERY Bilateral    cataract  . GANGLION CYST EXCISION Left 06/02/2012   Procedure: Excision ganglion left dorsal wrist;  Surgeon: Sanjuana Kava, MD;  Location: [facility];  Service: Orthopedics;  Laterality: Left;  . L2-L3 discectomy  2002  . L3-L4, L4-L5 lumbar decompression  2011  . Right shoulder cyst excision  2010  . SPINE SURGERY     twice, approx 2006, 2011   Social History   Social History Narrative  . Not on file     Objective: Vital Signs: There  were no vitals taken for this visit.   Physical Exam   Musculoskeletal Exam: ***  CDAI Exam: No CDAI exam completed.    Investigation: No additional findings. CBC Latest Ref Rng & Units 11/30/2016 07/14/2016 06/04/2016  WBC 3.8 - 10.8 Thousand/uL 6.8 6.7 8.2  Hemoglobin 11.7 - 15.5 g/dL 12.7 13.5 12.6  Hematocrit 35.0 - 45.0 % 36.9 40.5 39.4  Platelets 140 - 400 Thousand/uL 279 258 273   CMP Latest Ref Rng & Units 01/07/2017 11/30/2016  10/02/2016  Glucose 65 - 99 mg/dL 104(H) 125 112(H)  BUN 7 - 25 mg/dL 25 29(H) 19  Creatinine 0.60 - 0.88 mg/dL 0.89(H) 0.97(H) 0.93(H)  Sodium 135 - 146 mmol/L 139 141 141  Potassium 3.5 - 5.3 mmol/L 4.1 3.9 3.9  Chloride 98 - 110 mmol/L 105 104 104  CO2 20 - 32 mmol/L 27 30 28   Calcium 8.6 - 10.4 mg/dL 10.0 10.5(H) 10.1  Total Protein 6.1 - 8.1 g/dL 6.5 6.6 -  Total Bilirubin 0.2 - 1.2 mg/dL 0.5 0.5 -  Alkaline Phos 33 - 130 U/L - - -  AST 10 - 35 U/L 15 14 -  ALT 6 - 29 U/L 8 7 -    Imaging: No results found.  Speciality Comments: No specialty comments available.    Procedures:  No procedures performed Allergies: Aspirin and Sudafed [pseudoephedrine hcl]   Assessment / Plan:     Visit Diagnoses: No diagnosis found.    Orders: No orders of the defined types were placed in this encounter.  No orders of the defined types were placed in this encounter.   Face-to-face time spent with patient was *** minutes. 50% of time was spent in counseling and coordination of care.  Follow-Up Instructions: No Follow-up on file.   Earnestine Mealing, CMA  Note - This record has been created using Editor, commissioning.  Chart creation errors have been sought, but may not always  have been located. Such creation errors do not reflect on  the standard of medical care.

## 2017-02-23 ENCOUNTER — Other Ambulatory Visit: Payer: Self-pay | Admitting: *Deleted

## 2017-02-23 MED ORDER — METHOTREXATE 2.5 MG PO TABS: 7.5000 mg | ORAL_TABLET | ORAL | 0 refills | Status: DC

## 2017-02-23 NOTE — Telephone Encounter (Signed)
Refill request received via fax  Last Visit: 09/21/16 Next Visit: 02/24/17 Labs: 11/30/16 Stable  Okay to refill per Dr. Estanislado Pandy

## 2017-02-24 ENCOUNTER — Ambulatory Visit: Payer: 59 | Admitting: Physician Assistant

## 2017-02-26 NOTE — Progress Notes (Signed)
Office Visit Note  Patient: Stacy Meyers             Date of Birth: Jul 16, 1932           MRN: [id number]             PCP: Fayrene Helper, MD Referring: Fayrene Helper, MD Visit Date: 03/12/2017 Occupation: @GUAROCC @    Subjective:  Right knee swelling    History of Present Illness: Stacy Meyers is a 84 y.o. female with history of seropositive rheumatoid arthritis and osteoarthritis.  She states she continues to take methotrexate 3 tablets weekly, folic acid 1 mg daily, and prednisone 4 mg daily.  She denies any joint pain at this time.  She states she has noticed increased stiffness and some swelling in her right knee. She denies any other joint swelling.  She has been walking with a cane to help her feel more stable.  She denies any discomfort in her bilateral shoulders her bilateral feet.  She states her lower back will cause discomfort if she is standing for prolonged period of time.  She states the pain resolves when she sits and rests.   Activities of Daily Living:  Patient reports morning stiffness for 5-10 minutes.   Patient Denies nocturnal pain.  Difficulty dressing/grooming: Denies Difficulty climbing stairs: Reports Difficulty getting out of chair: Reports Difficulty using hands for taps, buttons, cutlery, and/or writing: Denies   Review of Systems  Constitutional: Negative for fatigue and weakness.  HENT: Negative for mouth sores, trouble swallowing, trouble swallowing, mouth dryness and nose dryness.   Eyes: Negative for pain, redness, visual disturbance and dryness.  Respiratory: Negative for cough, hemoptysis, shortness of breath and difficulty breathing.   Cardiovascular: Positive for hypertension. Negative for chest pain, palpitations, irregular heartbeat and swelling in legs/feet.  Gastrointestinal: Negative for blood in stool, constipation and diarrhea.  Endocrine: Negative for increased urination.  Genitourinary: Negative for painful urination.    Musculoskeletal: Positive for joint swelling and morning stiffness. Negative for arthralgias, joint pain, myalgias, muscle weakness, muscle tenderness and myalgias.  Skin: Positive for color change. Negative for pallor, rash, hair loss, nodules/bumps, redness, skin tightness, ulcers and sensitivity to sunlight.  Allergic/Immunologic: Negative for susceptible to infections.  Neurological: Negative for dizziness, numbness and headaches.  Hematological: Negative for swollen glands.  Psychiatric/Behavioral: Negative for depressed mood and sleep disturbance. The patient is not nervous/anxious.     PMFS History:  Patient Active Problem List   Diagnosis Date Noted  . Fibroids 05/08/2016  . Spondylosis of lumbar region without myelopathy or radiculopathy 12/24/2015  . Solitary bone cyst of right shoulder 05/28/2015  . Right knee pain 05/28/2015  . Osteoarthritis of both shoulders 05/28/2015  . Ganglion cyst of wrist 05/11/2012  . Systolic murmur 95/28/4132  . Diastolic dysfunction 44/01/270  . Seasonal allergies 08/28/2010  . Type 2 diabetes, diet controlled ([place]) 07/25/2009  . Herpes simplex virus (HSV) infection 09/16/2008  . Rheumatoid arthritis with rheumatoid factor of multiple sites without organ or systems involvement ([place]) 09/16/2008  . Hyperlipidemia LDL goal <100 05/23/2008  . Essential hypertension, benign 05/23/2008    Past Medical History:  Diagnosis Date  . Acute cholecystitis 12/16/2010  . Anemia 12/14/2010  . ARF (acute renal failure) ([place]) 12/14/2010  . Bradycardia    Sinus - noted 6/11  . Cholelithiasis 12/14/2010  . Collagen vascular disease ([place])   . Diastolic dysfunction 53/66/4403   Grade 1 per 2-D echocardiogram  . Dysrhythmia   .  Essential hypertension, benign   . HTN (hypertension) 12/14/2010  . Hypercalcemia 12/14/2010  . Hypokalemia 12/14/2010  . Mixed hyperlipidemia   . Osteoarthritis   . Rheumatoid arthritis(714.0)     Family History  Problem Relation  Age of Onset  . Stroke Mother   . Cancer Father   . Diabetes Sister   . Diabetes Sister   . Kidney failure Brother   . Kidney failure Brother   . Pancreatic cancer Sister   . Kidney Stones Sister        Only one Kidney  . Arthritis Sister   . Heart disease Daughter    Past Surgical History:  Procedure Laterality Date  . Bilateral cataract surgery  2009  . BUNIONECTOMY Right 2012  . CHOLECYSTECTOMY  12/17/2010   Procedure: LAPAROSCOPIC CHOLECYSTECTOMY;  Surgeon: Donato Heinz;  Location: [facility];  Service: General;  Laterality: N/A;  . COLONOSCOPY  05/22/2011   Procedure: COLONOSCOPY;  Surgeon: Rogene Houston, MD;  Location: [facility];  Service: Endoscopy;  Laterality: N/A;  1045  . EYE SURGERY Bilateral    cataract  . GANGLION CYST EXCISION Left 06/02/2012   Procedure: Excision ganglion left dorsal wrist;  Surgeon: Sanjuana Kava, MD;  Location: [facility];  Service: Orthopedics;  Laterality: Left;  . L2-L3 discectomy  2002  . L3-L4, L4-L5 lumbar decompression  2011  . Right shoulder cyst excision  2010  . SPINE SURGERY     twice, approx 2006, 2011   Social History   Social History Narrative  . Not on file     Objective: Vital Signs: BP (!) 155/65 (BP Location: Left Arm, Patient Position: Sitting, Cuff Size: Normal)   Pulse 65   Resp 14   Ht 5\' 1"  (1.549 m)   Wt 136 lb (61.7 kg)   BMI 25.70 kg/m    Physical Exam  Constitutional: She is oriented to person, place, and time. She appears well-developed and well-nourished.  HENT:  Head: Normocephalic and atraumatic.  Eyes: Conjunctivae and EOM are normal.  Neck: Normal range of motion.  Cardiovascular: Normal rate, regular rhythm, normal heart sounds and intact distal pulses.  Pulmonary/Chest: Effort normal and breath sounds normal.  Abdominal: Soft. Bowel sounds are normal.  Lymphadenopathy:    She has no cervical adenopathy.  Neurological: She is alert and oriented to person, place, and time.  Skin: Skin is warm  and dry. Capillary refill takes less than 2 seconds.  Psychiatric: She has a normal mood and affect. Her behavior is normal.  Nursing note and vitals reviewed.    Musculoskeletal Exam: C-spine good ROM.  Thoracic kyphosis noted.  She has some limitation of her lumbar spine.  No midline spinal tenderness.  No SI joint tenderness.  Very limited range of motion of bilateral shoulders with internal rotation and forward flexion.  Good range of motion of bilateral elbows.  Left ulnar deviation of all MCPs.  Synovial thickening of all MCPs but no synovitis was noted.  She has extensor tenosynovitis of her left wrist.  No tenderness of her left wrist.  Is a valgus deformity of her right knee with warmth no warmth or effusion of left knee.  Synovial thickening of bilateral ankles with no synovitis.  No trochanteric bursa tenderness.  CDAI Exam: CDAI Homunculus Exam:   Swelling:  LUE: wrist RLE: tibiofemoral  Joint Counts:  CDAI Tender Joint count: 0 CDAI Swollen Joint count: 2  Global Assessments:  Patient Global Assessment: 3 Provider Global Assessment: 3  CDAI Calculated Score: 8    Investigation: No additional findings. CBC Latest Ref Rng & Units 11/30/2016 07/14/2016 06/04/2016  WBC 3.8 - 10.8 Thousand/uL 6.8 6.7 8.2  Hemoglobin 11.7 - 15.5 g/dL 12.7 13.5 12.6  Hematocrit 35.0 - 45.0 % 36.9 40.5 39.4  Platelets 140 - 400 Thousand/uL 279 258 273   CMP Latest Ref Rng & Units 01/07/2017 11/30/2016 10/02/2016  Glucose 65 - 99 mg/dL 104(H) 125 112(H)  BUN 7 - 25 mg/dL 25 29(H) 19  Creatinine 0.60 - 0.88 mg/dL 0.89(H) 0.97(H) 0.93(H)  Sodium 135 - 146 mmol/L 139 141 141  Potassium 3.5 - 5.3 mmol/L 4.1 3.9 3.9  Chloride 98 - 110 mmol/L 105 104 104  CO2 20 - 32 mmol/L 27 30 28   Calcium 8.6 - 10.4 mg/dL 10.0 10.5(H) 10.1  Total Protein 6.1 - 8.1 g/dL 6.5 6.6 -  Total Bilirubin 0.2 - 1.2 mg/dL 0.5 0.5 -  Alkaline Phos 33 - 130 U/L - - -  AST 10 - 35 U/L 15 14 -  ALT 6 - 29 U/L 8 7 -     Imaging: No results found.  Speciality Comments: No specialty comments available.    Procedures:  No procedures performed Allergies: Aspirin and Sudafed [pseudoephedrine hcl]   Assessment / Plan:     Visit Diagnoses: Rheumatoid arthritis with rheumatoid factor of multiple sites without organ or systems involvement ([place]): She has extensor tenosynovitis of the left wrist and warmth of her right knee.  She has ulnar deviation of all MCPs with synovial thickening of all MCPs but no synovitis. We discussed increasing her methotrexate dose to 4 tablets weekly.  She would like to continue on methotrexate 3 tablets weekly at this time. She will continue on prednisone 4 mg daily.  She was given a refill of methotrexate and folic acid today.  High risk medication use - MTX 3 tablets and folic acid 1 mg, Prednisone 4 mg. CBC and CMP were drawn today to monitor for drug toxicity.- Plan: CBC with Differential/Platelet, COMPLETE METABOLIC PANEL WITH GFR  Primary osteoarthritis of both hands: Joint protection and muscle strengthening were discussed.  Primary osteoarthritis of both shoulders she has very limited range of motion with forward flexion and internal rotation of bilateral shoulders.  She denies any discomfort at this time.  Primary osteoarthritis of both knees: She has warmth and a mild effusion of her right knee.  Good range of motion of bilateral knees.  Walks with a cane for stability.  She will continue on methotrexate 3 tablets weekly and prednisone 4 mg daily.  Primary osteoarthritis of both feet: No discomfort at this time.  The importance of proper fitting shoes was discussed.   Spondylosis of lumbar region without myelopathy or radiculopathy: She has no midline spinal tenderness on exam today.  She has limited ROM of her lumbar spine.  She only experiences discomfort if she stands for a prolonged period of time.   Other medical conditions are listed as follows:   Tinea  cruris  Systolic murmur  Type 2 diabetes, diet controlled ([place])  Essential hypertension    Orders: Orders Placed This Encounter  Procedures  . CBC with Differential/Platelet  . COMPLETE METABOLIC PANEL WITH GFR   Meds ordered this encounter  Medications  . folic acid (FOLVITE) 1 MG tablet    Sig: Take 1 tablet (1 mg total) by mouth daily.    Dispense:  90 tablet    Refill:  4  . methotrexate (RHEUMATREX) 2.5  MG tablet    Sig: Take 3 tablets (7.5 mg total) by mouth once a week. Caution:Chemotherapy. Protect from light.    Dispense:  36 tablet    Refill:  0    Order Specific Question:   Supervising Provider    Answer:   Bo Merino [phone]    Face-to-face time spent with patient was 30 minutes. >50% of time was spent in counseling and coordination of care.  Follow-Up Instructions: Return in about 5 months (around 08/12/2017) for Rheumatoid arthritis, Osteoarthritis.   Ofilia Neas, PA-C   I examined and evaluated the patient with Hazel Sams PA. The plan of care was discussed as noted above.  Bo Merino, MD  Note - This record has been created using Editor, commissioning.  Chart creation errors have been sought, but may not always  have been located. Such creation errors do not reflect on  the standard of medical care.

## 2017-03-10 ENCOUNTER — Other Ambulatory Visit: Payer: Self-pay | Admitting: Family Medicine

## 2017-03-12 ENCOUNTER — Encounter: Payer: Self-pay | Admitting: Physician Assistant

## 2017-03-12 ENCOUNTER — Ambulatory Visit (INDEPENDENT_AMBULATORY_CARE_PROVIDER_SITE_OTHER): Payer: Medicare Other | Admitting: Physician Assistant

## 2017-03-12 VITALS — BP 155/65 | HR 65 | Resp 14 | Ht 61.0 in | Wt 136.0 lb

## 2017-03-12 DIAGNOSIS — B356 Tinea cruris: Secondary | ICD-10-CM | POA: Diagnosis not present

## 2017-03-12 DIAGNOSIS — M19011 Primary osteoarthritis, right shoulder: Secondary | ICD-10-CM | POA: Diagnosis not present

## 2017-03-12 DIAGNOSIS — R011 Cardiac murmur, unspecified: Secondary | ICD-10-CM | POA: Diagnosis not present

## 2017-03-12 DIAGNOSIS — E119 Type 2 diabetes mellitus without complications: Secondary | ICD-10-CM

## 2017-03-12 DIAGNOSIS — M47816 Spondylosis without myelopathy or radiculopathy, lumbar region: Secondary | ICD-10-CM

## 2017-03-12 DIAGNOSIS — M170 Bilateral primary osteoarthritis of knee: Secondary | ICD-10-CM | POA: Diagnosis not present

## 2017-03-12 DIAGNOSIS — M19071 Primary osteoarthritis, right ankle and foot: Secondary | ICD-10-CM

## 2017-03-12 DIAGNOSIS — M19041 Primary osteoarthritis, right hand: Secondary | ICD-10-CM | POA: Diagnosis not present

## 2017-03-12 DIAGNOSIS — M0579 Rheumatoid arthritis with rheumatoid factor of multiple sites without organ or systems involvement: Secondary | ICD-10-CM | POA: Diagnosis not present

## 2017-03-12 DIAGNOSIS — M19012 Primary osteoarthritis, left shoulder: Secondary | ICD-10-CM

## 2017-03-12 DIAGNOSIS — M19042 Primary osteoarthritis, left hand: Secondary | ICD-10-CM

## 2017-03-12 DIAGNOSIS — I10 Essential (primary) hypertension: Secondary | ICD-10-CM | POA: Diagnosis not present

## 2017-03-12 DIAGNOSIS — Z79899 Other long term (current) drug therapy: Secondary | ICD-10-CM

## 2017-03-12 DIAGNOSIS — M19072 Primary osteoarthritis, left ankle and foot: Secondary | ICD-10-CM

## 2017-03-12 MED ORDER — FOLIC ACID 1 MG PO TABS: 1.0000 mg | ORAL_TABLET | Freq: Every day | ORAL | 4 refills | Status: DC

## 2017-03-12 MED ORDER — METHOTREXATE 2.5 MG PO TABS: 7.5000 mg | ORAL_TABLET | ORAL | 0 refills | Status: DC

## 2017-03-12 NOTE — Patient Instructions (Signed)
Standing Labs We placed an order today for your standing lab work.    Please come back and get your standing labs in June and every 3 months  We have open lab Monday through Friday from 8:30-11:30 AM and 1:30-4 PM at the office of Dr. Shaili Deveshwar.   The office is located at [address], [address], [place], [place] [address] No appointment is necessary.   Labs are drawn by [facility].  You may receive a bill from [facility] for your lab work. If you have any questions regarding directions or hours of operation,  please call [phone].    

## 2017-03-13 LAB — COMPLETE METABOLIC PANEL WITH GFR
AG Ratio: 1.8 (calc) (ref 1.0–2.5)
ALBUMIN MSPROF: 4.4 g/dL (ref 3.6–5.1)
ALKALINE PHOSPHATASE (APISO): 60 U/L (ref 33–130)
ALT: 11 U/L (ref 6–29)
AST: 19 U/L (ref 10–35)
BUN / CREAT RATIO: 21 (calc) (ref 6–22)
BUN: 20 mg/dL (ref 7–25)
CALCIUM: 10.4 mg/dL (ref 8.6–10.4)
CO2: 28 mmol/L (ref 20–32)
CREATININE: 0.94 mg/dL — AB (ref 0.60–0.88)
Chloride: 105 mmol/L (ref 98–110)
GFR, EST NON AFRICAN AMERICAN: 56 mL/min/{1.73_m2} — AB (ref >=60)
GFR, Est African American: 65 mL/min/{1.73_m2} (ref >=60)
Globulin: 2.4 g/dL (calc) (ref 1.9–3.7)
Glucose, Bld: 108 mg/dL — ABNORMAL HIGH (ref 65–99)
Potassium: 4.4 mmol/L (ref 3.5–5.3)
Sodium: 141 mmol/L (ref 135–146)
Total Bilirubin: 0.7 mg/dL (ref 0.2–1.2)
Total Protein: 6.8 g/dL (ref 6.1–8.1)

## 2017-03-13 LAB — CBC WITH DIFFERENTIAL/PLATELET
BASOS PCT: 0.5 %
Basophils Absolute: 33 cells/uL (ref 0–200)
EOS PCT: 0.2 %
Eosinophils Absolute: 13 cells/uL — ABNORMAL LOW (ref 15–500)
HCT: 39.4 % (ref 35.0–45.0)
Hemoglobin: 13.7 g/dL (ref 11.7–15.5)
Lymphs Abs: 1047 cells/uL (ref 850–3900)
MCH: 32 pg (ref 27.0–33.0)
MCHC: 34.8 g/dL (ref 32.0–36.0)
MCV: 92.1 fL (ref 80.0–100.0)
MONOS PCT: 4.9 %
MPV: 9 fL (ref 7.5–12.5)
NEUTROS PCT: 78.3 %
Neutro Abs: 5090 cells/uL (ref 1500–7800)
PLATELETS: 265 10*3/uL (ref 140–400)
RBC: 4.28 10*6/uL (ref 3.80–5.10)
RDW: 13.1 % (ref 11.0–15.0)
TOTAL LYMPHOCYTE: 16.1 %
WBC: 6.5 10*3/uL (ref 3.8–10.8)
WBCMIX: 319 {cells}/uL (ref 200–950)

## 2017-03-14 ENCOUNTER — Other Ambulatory Visit: Payer: Self-pay | Admitting: Family Medicine

## 2017-03-15 NOTE — Progress Notes (Signed)
Glucose mildly elevated. Labs are stable.

## 2017-03-25 ENCOUNTER — Other Ambulatory Visit: Payer: Self-pay | Admitting: Rheumatology

## 2017-03-26 ENCOUNTER — Other Ambulatory Visit: Payer: Self-pay | Admitting: Family Medicine

## 2017-03-26 NOTE — Telephone Encounter (Signed)
Last Visit: 03/12/17 Next Visit: 08/18/17  Okay to refill per Dr. Estanislado Pandy

## 2017-03-30 ENCOUNTER — Other Ambulatory Visit: Payer: Self-pay | Admitting: Family Medicine

## 2017-03-30 DIAGNOSIS — E559 Vitamin D deficiency, unspecified: Secondary | ICD-10-CM

## 2017-03-30 DIAGNOSIS — R7989 Other specified abnormal findings of blood chemistry: Secondary | ICD-10-CM

## 2017-03-30 DIAGNOSIS — E669 Obesity, unspecified: Principal | ICD-10-CM

## 2017-03-30 DIAGNOSIS — E785 Hyperlipidemia, unspecified: Secondary | ICD-10-CM

## 2017-03-30 DIAGNOSIS — E1169 Type 2 diabetes mellitus with other specified complication: Secondary | ICD-10-CM

## 2017-03-30 DIAGNOSIS — M0579 Rheumatoid arthritis with rheumatoid factor of multiple sites without organ or systems involvement: Secondary | ICD-10-CM

## 2017-03-30 DIAGNOSIS — I10 Essential (primary) hypertension: Secondary | ICD-10-CM

## 2017-05-03 ENCOUNTER — Telehealth: Payer: Self-pay | Admitting: Family Medicine

## 2017-05-03 NOTE — Telephone Encounter (Signed)
CALLED PATIENT TO LET HER KNOW THAT HER APPT FOR 05/17/17 HAS BEEN R/S TO 5/24.  NO ANSWER, NO VOICEMAIL, I MAILED A REMINDER.

## 2017-05-10 ENCOUNTER — Ambulatory Visit (HOSPITAL_COMMUNITY)
Admission: RE | Admit: 2017-05-10 | Discharge: 2017-05-10 | Disposition: A | Discharge status: Home / Self Care | Payer: Medicare Other | Source: Ambulatory Visit | Attending: Family Medicine | Admitting: Family Medicine

## 2017-05-10 ENCOUNTER — Other Ambulatory Visit: Payer: Self-pay | Admitting: Family Medicine

## 2017-05-10 ENCOUNTER — Encounter (HOSPITAL_COMMUNITY): Payer: Self-pay

## 2017-05-10 DIAGNOSIS — R921 Mammographic calcification found on diagnostic imaging of breast: Secondary | ICD-10-CM | POA: Insufficient documentation

## 2017-05-10 DIAGNOSIS — Z1231 Encounter for screening mammogram for malignant neoplasm of breast: Secondary | ICD-10-CM | POA: Insufficient documentation

## 2017-05-17 ENCOUNTER — Ambulatory Visit: Payer: Medicare Other

## 2017-05-18 ENCOUNTER — Ambulatory Visit (HOSPITAL_COMMUNITY)
Admission: RE | Admit: 2017-05-18 | Discharge: 2017-05-18 | Disposition: A | Discharge status: Home / Self Care | Payer: Medicare Other | Source: Ambulatory Visit | Attending: Family Medicine | Admitting: Family Medicine

## 2017-05-18 DIAGNOSIS — R921 Mammographic calcification found on diagnostic imaging of breast: Secondary | ICD-10-CM | POA: Diagnosis not present

## 2017-05-24 ENCOUNTER — Ambulatory Visit (INDEPENDENT_AMBULATORY_CARE_PROVIDER_SITE_OTHER): Payer: Medicare Other

## 2017-05-24 VITALS — BP 132/64 | HR 54 | Temp 97.8°F | Resp 16 | Ht 59.0 in | Wt 140.1 lb

## 2017-05-24 DIAGNOSIS — Z0000 Encounter for general adult medical examination without abnormal findings: Secondary | ICD-10-CM | POA: Diagnosis not present

## 2017-05-24 NOTE — Progress Notes (Signed)
Subjective:   Stacy Meyers is a 84 y.o. female who presents for Medicare Annual (Subsequent) preventive examination.  Review of Systems:   Cardiac Risk Factors include: advanced age (>28men, >91 women)     Objective:     Vitals: BP 132/64 (BP Location: Right Arm, Patient Position: Sitting, Cuff Size: Normal)   Pulse (!) 54   Temp 97.8 F (36.6 C) (Oral)   Resp 16   Ht 4\' 11"  (1.499 m)   Wt 140 lb 1.9 oz (63.6 kg)   SpO2 98%   BMI 28.30 kg/m   Body mass index is 28.3 kg/m.  Advanced Directives 05/24/2017 05/06/2016 06/02/2012 06/01/2012 05/22/2011 04/16/2011 12/17/2010  Does Patient Have a Medical Advance Directive? Yes Yes Patient does not have advance directive;Patient would not like information Patient does not have advance directive Patient does not have advance directive;Patient would like information Patient does not have advance directive;Patient would not like information -  Type of Advance Directive Living will [place];Living will - - - - -  Does patient want to make changes to medical advance directive? No - Patient declined No - Patient declined - - - - -  Copy of Press photographer in Chart? - No - copy requested - - - - -  Would patient like information on creating a medical advance directive? - - - - Advance directive packet given - -  Pre-existing out of facility DNR order (yellow form or pink MOST form) - - No No No No No    Tobacco Social History   Tobacco Use  Smoking Status Never Smoker  Smokeless Tobacco Never Used  Tobacco Comment   Second hand smoke      Counseling given: Yes Comment: Second hand smoke    Clinical Intake:  Pre-visit preparation completed: Yes  Pain : No/denies pain     BMI - recorded: 28.3 Nutritional Status: BMI 25 -29 Overweight Diabetes: Yes CBG done?: No Did pt. bring in CBG monitor from home?: No  How often do you need to have someone help you when you read instructions, pamphlets, or  other written materials from your doctor or pharmacy?: 1 - Never What is the last grade level you completed in school?: 12th  Interpreter Needed?: No  Information entered by :: Vilinda Blanks  Past Medical History:  Diagnosis Date  . Acute cholecystitis 12/16/2010  . Anemia 12/14/2010  . ARF (acute renal failure) ([place]) 12/14/2010  . Bradycardia    Sinus - noted 6/11  . Cholelithiasis 12/14/2010  . Collagen vascular disease ([place])   . Diastolic dysfunction 79/89/2119   Grade 1 per 2-D echocardiogram  . Dysrhythmia   . Essential hypertension, benign   . HTN (hypertension) 12/14/2010  . Hypercalcemia 12/14/2010  . Hypokalemia 12/14/2010  . Mixed hyperlipidemia   . Osteoarthritis   . Rheumatoid arthritis(714.0)    Past Surgical History:  Procedure Laterality Date  . Bilateral cataract surgery  2009  . BUNIONECTOMY Right 2012  . CHOLECYSTECTOMY  12/17/2010   Procedure: LAPAROSCOPIC CHOLECYSTECTOMY;  Surgeon: Donato Heinz;  Location: [facility];  Service: General;  Laterality: N/A;  . COLONOSCOPY  05/22/2011   Procedure: COLONOSCOPY;  Surgeon: Rogene Houston, MD;  [facility];  Service: Endoscopy;  Laterality: N/A;  1045  . EYE SURGERY Bilateral    cataract  . GANGLION CYST EXCISION Left 06/02/2012   Procedure: Excision ganglion left dorsal wrist;  Surgeon: Sanjuana Kava, MD;  Location: [facility];  Service: Orthopedics;  Laterality: Left;  . L2-L3 discectomy  2002  . L3-L4, L4-L5 lumbar decompression  2011  . Right shoulder cyst excision  2010  . SPINE SURGERY     twice, approx 2006, 2011   Family History  Problem Relation Age of Onset  . Stroke Mother   . Cancer Father   . Diabetes Sister   . Diabetes Sister   . Kidney failure Brother   . Kidney failure Brother   . Pancreatic cancer Sister   . Kidney Stones Sister        Only one Kidney  . Arthritis Sister   . Heart disease Daughter    Social History   Socioeconomic History  . Marital status: Widowed    Spouse  name: Not on file  . Number of children: 1  . Years of education: Not on file  . Highest education level: Not on file  Occupational History  . Occupation: Retired    Comment: Administrator  . Financial resource strain: Somewhat hard  . Food insecurity:    Worry: Sometimes true    Inability: Sometimes true  . Transportation needs:    Medical: No    Non-medical: No  Tobacco Use  . Smoking status: Never Smoker  . Smokeless tobacco: Never Used  . Tobacco comment: Second hand smoke   Substance and Sexual Activity  . Alcohol use: No  . Drug use: No  . Sexual activity: Not Currently  Lifestyle  . Physical activity:    Days per week: 0 days    Minutes per session: 0 min  . Stress: Not at all  Relationships  . Social connections:    Talks on phone: More than three times a week    Gets together: Three times a week    Attends religious service: More than 4 times per year    Active member of club or organization: Yes    Attends meetings of clubs or organizations: 1 to 4 times per year    Relationship status: Widowed  Other Topics Concern  . Not on file  Social History Narrative   Husband passed away 04-24-2016. It has been financially harder to pay the bills since his passing, but she is making it. Has grandchildren around which makes it easier.     Outpatient Encounter Medications as of 05/24/2017  Medication Sig  . acetaminophen (TYLENOL) 500 MG tablet Take 1,000 mg by mouth every 6 (six) hours as needed. Reported on 05/28/2015  . acyclovir (ZOVIRAX) 400 MG tablet TAKE 1 TABLET BY MOUTH TWICE A DAY  . alendronate (FOSAMAX) 70 MG tablet TAKE 1 TABLET BY MOUTH EVERY 7 DAYS. TAKE WITH A FULL GLASS OF WATER ON AN EMPTY STOMACH.  Marland Kitchen amLODipine (NORVASC) 5 MG tablet TAKE 1 TABLET (5 MG TOTAL) BY MOUTH DAILY. DISCONTINUE AMLODIPINE 2.5 MG TABLET EFFECTIVE 10/07/2016  . azelastine (ASTELIN) 0.1 % nasal spray Place 2 sprays into both nostrils 2 (two) times daily. Use in each  nostril as directed (Patient taking differently: Place 2 sprays into both nostrils as needed. Use in each nostril as directed)  . clopidogrel (PLAVIX) 75 MG tablet Take 1 tablet (75 mg total) by mouth daily.  Marland Kitchen diltiazem (CARDIZEM CD) 120 MG 24 hr capsule TAKE 1 CAPSULE (120 MG TOTAL) BY MOUTH DAILY.  . ergocalciferol (VITAMIN D2) 50000 units capsule Take 1 capsule (50,000 Units total) by mouth once a week. One capsule once weekly  . folic acid (FOLVITE) 1  MG tablet Take 1 tablet (1 mg total) by mouth daily.  Marland Kitchen KLOR-CON M20 20 MEQ tablet TAKE 1 TABLET (20 MEQ TOTAL) BY MOUTH DAILY.  . methotrexate (RHEUMATREX) 2.5 MG tablet Take 3 tablets (7.5 mg total) by mouth once a week. Caution:Chemotherapy. Protect from light.  . predniSONE (DELTASONE) 1 MG tablet 4 tablets daily with breakfast  . predniSONE (DELTASONE) 1 MG tablet TAKE 4 TABLETS (4 MG TOTAL) BY MOUTH DAILY WITH BREAKFAST.  . rosuvastatin (CRESTOR) 5 MG tablet TAKE 1 TABLET BY MOUTH EVERY OTHER DAY  . UNABLE TO FIND Diabetic shoes  . UNABLE TO FIND Diabetic shoes x 1 Inserts x 3  DX E11.9  . [DISCONTINUED] montelukast (SINGULAIR) 10 MG tablet TAKE 1 TABLET (10 MG TOTAL) BY MOUTH AT BEDTIME. (Patient not taking: Reported on 03/12/2017)   No facility-administered encounter medications on file as of 05/24/2017.     Activities of Daily Living In your present state of health, do you have any difficulty performing the following activities: 05/24/2017  Hearing? Y  Vision? Y  Comment If she reads a lot at night and is reading a lot.  Difficulty concentrating or making decisions? Y  Comment from time to time  Walking or climbing stairs? Y  Comment railings on steps makes it much easier  Dressing or bathing? Y  Comment replace one bathtub with just a shower  Doing errands, shopping? N  Preparing Food and eating ? N  Using the Toilet? N  In the past six months, have you accidently leaked urine? N  Do you have problems with loss of bowel  control? N  Managing your Medications? N  Managing your Finances? N  Housekeeping or managing your Housekeeping? N  Some recent data might be hidden    Patient Care Team: Fayrene Helper, MD as PCP - General Estanislado Emms, MD as Consulting Physician (Nephrology) Bo Merino, MD as Consulting Physician (Rheumatology)    Assessment:   This is a routine wellness examination for Jo[place].  Exercise Activities and Dietary recommendations Current Exercise Habits: The patient does not participate in regular exercise at present, Exercise limited by: orthopedic condition(s)  Goals    . Exercise 3x per week (30 min per time)     Recommend starting a routine exercise program at least 3 days a week for 30-45 minutes at a time as tolerated.         Fall Risk Fall Risk  05/24/2017 01/13/2017 05/06/2016 02/04/2015 09/03/2014  Falls in the past year? No Yes Yes No No  Comment - - - - -  Number falls in past yr: - 1 1 - -  Injury with Fall? - - No - -  Risk for fall due to : - - - - -  Follow up - - Education provided;Falls prevention discussed - -  Comment - - recommend not standing on furniture to help prevent any further falls.  - -   Is the patient's home free of loose throw rugs in walkways, pet beds, electrical cords, etc?   yes      Grab bars in the bathroom? yes      Handrails on the stairs?   yes      Adequate lighting?   yes  Depression Screen PHQ 2/9 Scores 05/24/2017 05/06/2016 02/04/2015 09/03/2014  PHQ - 2 Score 1 0 0 0  PHQ- 9 Score - - - -     Cognitive Function     6CIT Screen 05/24/2017 05/06/2016  What Year? 0 points 0 points  What month? 0 points 0 points  What time? 0 points 0 points  Count back from 20 0 points 0 points  Months in reverse 0 points 0 points  Repeat phrase 0 points 0 points  Total Score 0 0    Immunization History  Administered Date(s) Administered  . Influenza Split 11/07/2013  . Influenza Whole 10/28/2009, 09/17/2010  .  Influenza,inj,Quad PF,6+ Mos 09/03/2014  . Influenza-Unspecified 10/05/2013, 09/29/2016  . PPD Test 05/14/2014, 05/28/2015  . Pneumococcal Conjugate-13 08/03/2013  . Pneumococcal Polysaccharide-23 08/28/2009  . Tdap 04/29/2010    Qualifies for Shingles Vaccine?No  Screening Tests Health Maintenance  Topic Date Due  . HEMOGLOBIN A1C  04/01/2017  . OPHTHALMOLOGY EXAM  04/27/2017  . INFLUENZA VACCINE  08/05/2017  . URINE MICROALBUMIN  10/07/2017  . FOOT EXAM  10/11/2017  . TETANUS/TDAP  04/28/2020  . DEXA SCAN  Completed  . PNA vac Low Risk Adult  Completed    Cancer Screenings: Lung: Low Dose CT Chest recommended if Age 75-80 years, 30 pack-year currently smoking OR have quit w/in 15years. Patient does not qualify. Breast:  Up to date on Mammogram? Yes   Up to date of Bone Density/Dexa? Yes Colorectal: UTD     Plan:     I have personally reviewed and noted the following in the patient's chart:   . Medical and social history . Use of alcohol, tobacco or illicit drugs  . Current medications and supplements . Functional ability and status . Nutritional status . Physical activity . Advanced directives . List of other physicians . Hospitalizations, surgeries, and ER visits in previous 12 months . Vitals . Screenings to include cognitive, depression, and falls . Referrals and appointments  In addition, I have reviewed and discussed with patient certain preventive protocols, quality metrics, and best practice recommendations. A written personalized care plan for preventive services as well as general preventive health recommendations were provided to patient.     Tod Persia, [place]  05/24/2017

## 2017-05-24 NOTE — Patient Instructions (Signed)
Stacy Meyers , Thank you for taking time to come for your Medicare Wellness Visit. I appreciate your ongoing commitment to your health goals. Please review the following plan we discussed and let me know if I can assist you in the future.   Screening recommendations/referrals: Colonoscopy: UTD Mammogram: UTD (follow up in 6 months) Bone Density: UTD Recommended yearly ophthalmology/optometry visit for glaucoma screening and checkup Recommended yearly dental visit for hygiene and checkup  Vaccinations: Influenza vaccine: Due in fall 2019 Pneumococcal vaccine: UTD Tdap vaccine: UTD Shingles vaccine: You are unable to get this vaccine because of possible reactions to other medications/injections you are already getting.   Advanced directives: You already have this in place.   Conditions/risks identified: Discussed during the visit.  Next appointment: Your next appointment with Dr.Simpson is on August 16, 2017 @ 9:00 am. If you need [place] before then, please do not hesitate to call us.    Preventive Care 62 Years and Older, Female Preventive care refers to lifestyle choices and visits with your health care provider that can promote health and wellness. What does preventive care include?  A yearly physical exam. This is also called an annual well check.  Dental exams once or twice a year.  Routine eye exams. Ask your health care provider how often you should have your eyes checked.  Personal lifestyle choices, including:  Daily care of your teeth and gums.  Regular physical activity.  Eating a healthy diet.  Avoiding tobacco and drug use.  Limiting alcohol use.  Practicing safe sex.  Taking low-dose aspirin every day.  Taking vitamin and mineral supplements as recommended by your health care provider. What happens during an annual well check? The services and screenings done by your health care provider during your annual well check will depend on your age, overall health,  lifestyle risk factors, and family history of disease. Counseling  Your health care provider may ask you questions about your:  Alcohol use.  Tobacco use.  Drug use.  Emotional well-being.  Home and relationship well-being.  Sexual activity.  Eating habits.  History of falls.  Memory and ability to understand (cognition).  Work and work Statistician.  Reproductive health. Screening  You may have the following tests or measurements:  Height, weight, and BMI.  Blood pressure.  Lipid and cholesterol levels. These may be checked every 5 years, or more frequently if you are over 22 years old.  Skin check.  Lung cancer screening. You may have this screening every year starting at age 28 if you have a 30-pack-year history of smoking and currently smoke or have quit within the past 15 years.  Fecal occult blood test (FOBT) of the stool. You may have this test every year starting at age 45.  Flexible sigmoidoscopy or colonoscopy. You may have a sigmoidoscopy every 5 years or a colonoscopy every 10 years starting at age 57.  Hepatitis C blood test.  Hepatitis B blood test.  Sexually transmitted disease (STD) testing.  Diabetes screening. This is done by checking your blood sugar (glucose) after you have not eaten for a while (fasting). You may have this done every 1-3 years.  Bone density scan. This is done to screen for osteoporosis. You may have this done starting at age 66.  Mammogram. This may be done every 1-2 years. Talk to your health care provider about how often you should have regular mammograms. Talk with your health care provider about your test results, treatment options, and if necessary, the  need for more tests. Vaccines  Your health care provider may recommend certain vaccines, such as:  Influenza vaccine. This is recommended every year.  Tetanus, diphtheria, and acellular pertussis (Tdap, Td) vaccine. You may need a Td booster every 10 years.  Zoster  vaccine. You may need this after age 68.  Pneumococcal 13-valent conjugate (PCV13) vaccine. One dose is recommended after age 39.  Pneumococcal polysaccharide (PPSV23) vaccine. One dose is recommended after age 74. Talk to your health care provider about which screenings and vaccines you need and how often you need them. This information is not intended to replace advice given to you by your health care provider. Make sure you discuss any questions you have with your health care provider. Document Released: 01/18/2015 Document Revised: 09/11/2015 Document Reviewed: 10/23/2014 Elsevier Interactive Patient Education  2017 [place] Prevention in the Home Falls can cause injuries. They can happen to people of all ages. There are many things you can do to make your home safe and to help prevent falls. What can I do on the outside of my home?  Regularly fix the edges of walkways and driveways and fix any cracks.  Remove anything that might make you trip as you walk through a door, such as a raised step or threshold.  Trim any bushes or trees on the path to your home.  Use bright outdoor lighting.  Clear any walking paths of anything that might make someone trip, such as rocks or tools.  Regularly check to see if handrails are loose or broken. Make sure that both sides of any steps have handrails.  Any raised decks and porches should have guardrails on the edges.  Have any leaves, snow, or ice cleared regularly.  Use sand or salt on walking paths during winter.  Clean up any spills in your garage right away. This includes oil or grease spills. What can I do in the bathroom?  Use night lights.  Install grab bars by the toilet and in the tub and shower. Do not use towel bars as grab bars.  Use non-skid mats or decals in the tub or shower.  If you need to sit down in the shower, use a plastic, non-slip stool.  Keep the floor dry. Clean up any water that spills on the  floor as soon as it happens.  Remove soap buildup in the tub or shower regularly.  Attach bath mats securely with double-sided non-slip rug tape.  Do not have throw rugs and other things on the floor that can make you trip. What can I do in the bedroom?  Use night lights.  Make sure that you have a light by your bed that is easy to reach.  Do not use any sheets or blankets that are too big for your bed. They should not hang down onto the floor.  Have a firm chair that has side arms. You can use this for support while you get dressed.  Do not have throw rugs and other things on the floor that can make you trip. What can I do in the kitchen?  Clean up any spills right away.  Avoid walking on wet floors.  Keep items that you use a lot in easy-to-reach places.  If you need to reach something above you, use a strong step stool that has a grab bar.  Keep electrical cords out of the way.  Do not use floor polish or wax that makes floors slippery. If you must use wax,  use non-skid floor wax.  Do not have throw rugs and other things on the floor that can make you trip. What can I do with my stairs?  Do not leave any items on the stairs.  Make sure that there are handrails on both sides of the stairs and use them. Fix handrails that are broken or loose. Make sure that handrails are as long as the stairways.  Check any carpeting to make sure that it is firmly attached to the stairs. Fix any carpet that is loose or worn.  Avoid having throw rugs at the top or bottom of the stairs. If you do have throw rugs, attach them to the floor with carpet tape.  Make sure that you have a light switch at the top of the stairs and the bottom of the stairs. If you do not have them, ask someone to add them for you. What else can I do to help prevent falls?  Wear shoes that:  Do not have high heels.  Have rubber bottoms.  Are comfortable and fit you well.  Are closed at the toe. Do not wear  sandals.  If you use a stepladder:  Make sure that it is fully opened. Do not climb a closed stepladder.  Make sure that both sides of the stepladder are locked into place.  Ask someone to hold it for you, if possible.  Clearly mark and make sure that you can see:  Any grab bars or handrails.  First and last steps.  Where the edge of each step is.  Use tools that help you move around (mobility aids) if they are needed. These include:  Canes.  Walkers.  Scooters.  Crutches.  Turn on the lights when you go into a dark area. Replace any light bulbs as soon as they burn out.  Set up your furniture so you have a clear path. Avoid moving your furniture around.  If any of your floors are uneven, fix them.  If there are any pets around you, be aware of where they are.  Review your medicines with your doctor. Some medicines can make you feel dizzy. This can increase your chance of falling. Ask your doctor what other things that you can do to help prevent falls. This information is not intended to replace advice given to you by your health care provider. Make sure you discuss any questions you have with your health care provider. Document Released: 10/18/2008 Document Revised: 05/30/2015 Document Reviewed: 01/26/2014 Elsevier Interactive Patient Education  2017 [employer].

## 2017-05-25 ENCOUNTER — Other Ambulatory Visit: Payer: Self-pay | Admitting: Cardiology

## 2017-05-28 ENCOUNTER — Ambulatory Visit: Payer: Medicare Other

## 2017-06-02 ENCOUNTER — Other Ambulatory Visit: Payer: Self-pay | Admitting: Physician Assistant

## 2017-06-02 NOTE — Telephone Encounter (Signed)
Last visit: 03/12/2017 Next visit: 08/18/2017 Labs: 03/12/2017 Glucose mildly elevated. Labs are stable.   Okay to refill per Dr. Estanislado Pandy.

## 2017-06-24 ENCOUNTER — Other Ambulatory Visit: Payer: Self-pay | Admitting: Rheumatology

## 2017-06-24 ENCOUNTER — Other Ambulatory Visit: Payer: Self-pay | Admitting: Family Medicine

## 2017-06-24 DIAGNOSIS — E785 Hyperlipidemia, unspecified: Secondary | ICD-10-CM

## 2017-06-24 NOTE — Telephone Encounter (Signed)
Last visit: 03/12/2017 Next visit: 08/18/2017  Okay to refill per Dr. Estanislado Pandy

## 2017-07-14 ENCOUNTER — Ambulatory Visit (INDEPENDENT_AMBULATORY_CARE_PROVIDER_SITE_OTHER): Payer: Medicare Other | Admitting: Cardiology

## 2017-07-14 ENCOUNTER — Encounter: Payer: Self-pay | Admitting: Cardiology

## 2017-07-14 VITALS — BP 118/64 | HR 59 | Ht 62.0 in | Wt 143.0 lb

## 2017-07-14 DIAGNOSIS — E782 Mixed hyperlipidemia: Secondary | ICD-10-CM | POA: Diagnosis not present

## 2017-07-14 DIAGNOSIS — R001 Bradycardia, unspecified: Secondary | ICD-10-CM | POA: Diagnosis not present

## 2017-07-14 DIAGNOSIS — I10 Essential (primary) hypertension: Secondary | ICD-10-CM | POA: Diagnosis not present

## 2017-07-14 DIAGNOSIS — E118 Type 2 diabetes mellitus with unspecified complications: Secondary | ICD-10-CM

## 2017-07-14 NOTE — Patient Instructions (Signed)
Medication Instructions:  STOP PLAVIX STOP DILTIAZEM   Labwork: NONE  Testing/Procedures: NONE  Follow-Up: Your physician wants you to follow-up in: 1 YEAR.  You will receive a reminder letter in the mail two months in advance. If you don't receive a letter, please call our office to schedule the follow-up appointment.   Any Other Special Instructions Will Be Listed Below (If Applicable).     If you need a refill on your cardiac medications before your next appointment, please call your pharmacy.

## 2017-07-14 NOTE — Progress Notes (Signed)
Clinical Summary Stacy Meyers is a 84 y.o.female seen today for follow up of the following medical problems.    1. Bradycardia - seen prevoiusly in cardiology clinic for low heart rates -denies any recent symptoms.    2. HTN - compliant with meds  3. Hyperlipidemia - compliant with statin - Jan 2019 TC 166 HDL 66 TG 83 LDL 83  4. DM2 - diet controlled - followed by pcp    Past Medical History:  Diagnosis Date  . Acute cholecystitis 12/16/2010  . Anemia 12/14/2010  . ARF (acute renal failure) ([place]) 12/14/2010  . Bradycardia    Sinus - noted 6/11  . Cholelithiasis 12/14/2010  . Collagen vascular disease ([place])   . Diastolic dysfunction 67/59/1638   Grade 1 per 2-D echocardiogram  . Dysrhythmia   . Essential hypertension, benign   . HTN (hypertension) 12/14/2010  . Hypercalcemia 12/14/2010  . Hypokalemia 12/14/2010  . Mixed hyperlipidemia   . Osteoarthritis   . Rheumatoid arthritis(714.0)      Allergies  Allergen Reactions  . Aspirin Other (See Comments)    REACTION: burns stomach (uncoated)  . Sudafed [Pseudoephedrine Hcl] Other (See Comments)    Increased joint pain     Current Outpatient Medications  Medication Sig Dispense Refill  . acetaminophen (TYLENOL) 500 MG tablet Take 1,000 mg by mouth every 6 (six) hours as needed. Reported on 05/28/2015    . acyclovir (ZOVIRAX) 400 MG tablet TAKE 1 TABLET BY MOUTH TWICE A DAY 180 tablet 1  . alendronate (FOSAMAX) 70 MG tablet TAKE 1 TABLET BY MOUTH EVERY 7 DAYS. TAKE WITH A FULL GLASS OF WATER ON AN EMPTY STOMACH. 12 tablet 1  . amLODipine (NORVASC) 5 MG tablet TAKE 1 TABLET (5 MG TOTAL) BY MOUTH DAILY. DISCONTINUE AMLODIPINE 2.5 MG TABLET EFFECTIVE 10/07/2016 30 tablet 4  . azelastine (ASTELIN) 0.1 % nasal spray Place 2 sprays into both nostrils 2 (two) times daily. Use in each nostril as directed (Patient taking differently: Place 2 sprays into both nostrils as needed. Use in each nostril as directed) 30 mL 12    . clopidogrel (PLAVIX) 75 MG tablet TAKE 1 TABLET BY MOUTH EVERY DAY 90 tablet 3  . diltiazem (CARDIZEM CD) 120 MG 24 hr capsule TAKE 1 CAPSULE BY MOUTH EVERY DAY 90 capsule 1  . ergocalciferol (VITAMIN D2) 50000 units capsule Take 1 capsule (50,000 Units total) by mouth once a week. One capsule once weekly 12 capsule 3  . folic acid (FOLVITE) 1 MG tablet Take 1 tablet (1 mg total) by mouth daily. 90 tablet 4  . KLOR-CON M20 20 MEQ tablet TAKE 1 TABLET (20 MEQ TOTAL) BY MOUTH DAILY. 90 tablet 1  . methotrexate (RHEUMATREX) 2.5 MG tablet TAKE 3 TABLETS (7.5 MG TOTAL) BY MOUTH ONCE A WEEK. CAUTION:CHEMOTHERAPY. PROTECT FROM LIGHT. 36 tablet 0  . predniSONE (DELTASONE) 1 MG tablet 4 tablets daily with breakfast  0  . predniSONE (DELTASONE) 1 MG tablet TAKE 4 TABLETS (4 MG TOTAL) BY MOUTH DAILY WITH BREAKFAST. 360 tablet 0  . rosuvastatin (CRESTOR) 5 MG tablet TAKE 1 TABLET BY MOUTH EVERY OTHER DAY 45 tablet 3  . UNABLE TO FIND Diabetic shoes 1 Package 0  . UNABLE TO FIND Diabetic shoes x 1 Inserts x 3  DX E11.9 1 each 0   No current facility-administered medications for this visit.      Past Surgical History:  Procedure Laterality Date  . Bilateral cataract surgery  2009  .  BUNIONECTOMY Right 2012  . CHOLECYSTECTOMY  12/17/2010   Procedure: LAPAROSCOPIC CHOLECYSTECTOMY;  Surgeon: Donato Heinz;  Location: [facility];  Service: General;  Laterality: N/A;  . COLONOSCOPY  05/22/2011   Procedure: COLONOSCOPY;  Surgeon: Rogene Houston, MD;  Location: [facility];  Service: Endoscopy;  Laterality: N/A;  1045  . EYE SURGERY Bilateral    cataract  . GANGLION CYST EXCISION Left 06/02/2012   Procedure: Excision ganglion left dorsal wrist;  Surgeon: Sanjuana Kava, MD;  Location: [facility];  Service: Orthopedics;  Laterality: Left;  . L2-L3 discectomy  2002  . L3-L4, L4-L5 lumbar decompression  2011  . Right shoulder cyst excision  2010  . SPINE SURGERY     twice, approx 2006, 2011     Allergies   Allergen Reactions  . Aspirin Other (See Comments)    REACTION: burns stomach (uncoated)  . Sudafed [Pseudoephedrine Hcl] Other (See Comments)    Increased joint pain      Family History  Problem Relation Age of Onset  . Stroke Mother   . Cancer Father   . Diabetes Sister   . Diabetes Sister   . Kidney failure Brother   . Kidney failure Brother   . Pancreatic cancer Sister   . Kidney Stones Sister        Only one Kidney  . Arthritis Sister   . Heart disease Daughter      Social History Stacy Meyers reports that she has never smoked. She has never used smokeless tobacco. Stacy Meyers reports that she does not drink alcohol.   Review of Systems CONSTITUTIONAL: No weight loss, fever, chills, weakness or fatigue.  HEENT: Eyes: No visual loss, blurred vision, double vision or yellow sclerae.No hearing loss, sneezing, congestion, runny nose or sore throat.  SKIN: No rash or itching.  CARDIOVASCULAR: per hpi RESPIRATORY: No shortness of breath, cough or sputum.  GASTROINTESTINAL: No anorexia, nausea, vomiting or diarrhea. No abdominal pain or blood.  GENITOURINARY: No burning on urination, no polyuria NEUROLOGICAL: No headache, dizziness, syncope, paralysis, ataxia, numbness or tingling in the extremities. No change in bowel or bladder control.  MUSCULOSKELETAL: No muscle, back pain, joint pain or stiffness.  LYMPHATICS: No enlarged nodes. No history of splenectomy.  PSYCHIATRIC: No history of depression or anxiety.  ENDOCRINOLOGIC: No reports of sweating, cold or heat intolerance. No polyuria or polydipsia.  Marland Kitchen   Physical Examination Vitals:   07/14/17 1255  BP: 118/64  Pulse: (!) 59  SpO2: 97%   Filed Weights   07/14/17 1255  Weight: 143 lb (64.9 kg)    Gen: resting comfortably, no acute distress HEENT: no scleral icterus, pupils equal round and reactive, no palptable cervical adenopathy,  CV: RRR, no m/r/g, no jvd Resp: Clear to auscultation bilaterally GI: abdomen  is soft, non-tender, non-distended, normal bowel sounds, no hepatosplenomegaly MSK: extremities are warm, no edema.  Skin: warm, no rash Neuro:  no focal deficits Psych: appropriate affect   Diagnostic Studies     Assessment and Plan  1. Bradycardia - mild, asymptomatic. We will d/c her dilt since she is already on norvascn, no secondary indication to be on dilt  2. HTN - at goal, we will d/c dilt  3. Hyperlipidemia - control is reasonable, continue statin   4. DM2 - continue statin. We had previously started plavix due to aspirin intolerance and high risk diabetic patient. Given changes in antiplaetelt recommenations for primary prevention by national guidelines we will d/c plavix.   F/u  1 year       Arnoldo Lenis, M.D.

## 2017-07-25 ENCOUNTER — Encounter: Payer: Self-pay | Admitting: Cardiology

## 2017-08-10 ENCOUNTER — Telehealth: Payer: Self-pay | Admitting: Rheumatology

## 2017-08-10 DIAGNOSIS — Z79899 Other long term (current) drug therapy: Secondary | ICD-10-CM

## 2017-08-10 NOTE — Telephone Encounter (Signed)
Lab orders have been placed and released. Patient has been notified.

## 2017-08-10 NOTE — Telephone Encounter (Signed)
Patient going for her labs Friday or Saturday am to [facility] in [place]. Please release orders.

## 2017-08-13 ENCOUNTER — Other Ambulatory Visit: Payer: Self-pay | Admitting: Family Medicine

## 2017-08-13 DIAGNOSIS — Z79899 Other long term (current) drug therapy: Secondary | ICD-10-CM | POA: Diagnosis not present

## 2017-08-13 LAB — COMPLETE METABOLIC PANEL WITH GFR
AG RATIO: 2 (calc) (ref 1.0–2.5)
ALKALINE PHOSPHATASE (APISO): 58 U/L (ref 33–130)
ALT: 8 U/L (ref 6–29)
AST: 14 U/L (ref 10–35)
Albumin: 4.3 g/dL (ref 3.6–5.1)
BILIRUBIN TOTAL: 0.5 mg/dL (ref 0.2–1.2)
BUN/Creatinine Ratio: 22 (calc) (ref 6–22)
BUN: 21 mg/dL (ref 7–25)
CHLORIDE: 107 mmol/L (ref 98–110)
CO2: 29 mmol/L (ref 20–32)
Calcium: 9.8 mg/dL (ref 8.6–10.4)
Creat: 0.96 mg/dL — ABNORMAL HIGH (ref 0.60–0.88)
GFR, Est African American: 63 mL/min/{1.73_m2} (ref >=60)
GFR, Est Non African American: 55 mL/min/{1.73_m2} — ABNORMAL LOW (ref >=60)
Globulin: 2.2 g/dL (calc) (ref 1.9–3.7)
Glucose, Bld: 112 mg/dL (ref 65–139)
POTASSIUM: 3.9 mmol/L (ref 3.5–5.3)
Sodium: 143 mmol/L (ref 135–146)
Total Protein: 6.5 g/dL (ref 6.1–8.1)

## 2017-08-13 LAB — CBC WITH DIFFERENTIAL/PLATELET
BASOS PCT: 0.6 %
Basophils Absolute: 38 cells/uL (ref 0–200)
EOS PCT: 1.9 %
Eosinophils Absolute: 120 cells/uL (ref 15–500)
HCT: 38.3 % (ref 35.0–45.0)
HEMOGLOBIN: 13 g/dL (ref 11.7–15.5)
Lymphs Abs: 1890 cells/uL (ref 850–3900)
MCH: 31.7 pg (ref 27.0–33.0)
MCHC: 33.9 g/dL (ref 32.0–36.0)
MCV: 93.4 fL (ref 80.0–100.0)
MONOS PCT: 8.1 %
MPV: 8.9 fL (ref 7.5–12.5)
NEUTROS ABS: 3742 {cells}/uL (ref 1500–7800)
Neutrophils Relative %: 59.4 %
PLATELETS: 286 10*3/uL (ref 140–400)
RBC: 4.1 10*6/uL (ref 3.80–5.10)
RDW: 13.1 % (ref 11.0–15.0)
Total Lymphocyte: 30 %
WBC mixed population: 510 cells/uL (ref 200–950)
WBC: 6.3 10*3/uL (ref 3.8–10.8)

## 2017-08-13 NOTE — Telephone Encounter (Signed)
CBC normal

## 2017-08-16 ENCOUNTER — Telehealth: Payer: Self-pay | Admitting: Cardiology

## 2017-08-16 ENCOUNTER — Ambulatory Visit (INDEPENDENT_AMBULATORY_CARE_PROVIDER_SITE_OTHER): Payer: Medicare Other | Admitting: Family Medicine

## 2017-08-16 ENCOUNTER — Encounter: Payer: Self-pay | Admitting: Family Medicine

## 2017-08-16 ENCOUNTER — Other Ambulatory Visit: Payer: Self-pay

## 2017-08-16 VITALS — BP 130/68 | HR 62 | Resp 12 | Ht 59.0 in | Wt 139.1 lb

## 2017-08-16 DIAGNOSIS — R921 Mammographic calcification found on diagnostic imaging of breast: Secondary | ICD-10-CM

## 2017-08-16 DIAGNOSIS — E119 Type 2 diabetes mellitus without complications: Secondary | ICD-10-CM | POA: Diagnosis not present

## 2017-08-16 DIAGNOSIS — M0579 Rheumatoid arthritis with rheumatoid factor of multiple sites without organ or systems involvement: Secondary | ICD-10-CM

## 2017-08-16 DIAGNOSIS — E785 Hyperlipidemia, unspecified: Secondary | ICD-10-CM | POA: Diagnosis not present

## 2017-08-16 DIAGNOSIS — I10 Essential (primary) hypertension: Secondary | ICD-10-CM | POA: Diagnosis not present

## 2017-08-16 DIAGNOSIS — E663 Overweight: Secondary | ICD-10-CM

## 2017-08-16 DIAGNOSIS — E559 Vitamin D deficiency, unspecified: Secondary | ICD-10-CM

## 2017-08-16 NOTE — Telephone Encounter (Signed)
New Message    Received an after hours message from patient. She has questions about two medications that she is not taking. The message did not indicate what prescriptions. Please call.

## 2017-08-16 NOTE — Patient Instructions (Addendum)
F/U in 5 months, call if you need me before  Please schedule her mammogram due Nov 16 or after at checkout  Please come for your flu vaccine in September  Labs are good  Fasting lipid cmp and Egfr ,  and vit D and TSH 5 days before next visit  Please eat more vegetable and less sugar so you lose your tummy size by 1inch  Your foot exam today is  Being sent to [place] for new shoes when they are due, you need to contact them  Thank you  for choosing [facility]. We consider it a privelige to serve you.  Delivering excellent health care in a caring and  compassionate way is our goal.  Partnering with you,  so that together we can achieve this goal is our strategy.

## 2017-08-16 NOTE — Telephone Encounter (Signed)
Pt called to say she got a good lab report from her pcp and she feels well now

## 2017-08-17 ENCOUNTER — Ambulatory Visit: Payer: 59 | Admitting: Rheumatology

## 2017-08-18 ENCOUNTER — Ambulatory Visit (INDEPENDENT_AMBULATORY_CARE_PROVIDER_SITE_OTHER): Payer: Medicare Other | Admitting: Rheumatology

## 2017-08-18 ENCOUNTER — Encounter: Payer: Self-pay | Admitting: Rheumatology

## 2017-08-18 VITALS — BP 154/72 | HR 59 | Resp 16 | Ht 59.0 in | Wt 140.6 lb

## 2017-08-18 DIAGNOSIS — M47816 Spondylosis without myelopathy or radiculopathy, lumbar region: Secondary | ICD-10-CM | POA: Diagnosis not present

## 2017-08-18 DIAGNOSIS — E119 Type 2 diabetes mellitus without complications: Secondary | ICD-10-CM | POA: Diagnosis not present

## 2017-08-18 DIAGNOSIS — Z79899 Other long term (current) drug therapy: Secondary | ICD-10-CM

## 2017-08-18 DIAGNOSIS — B356 Tinea cruris: Secondary | ICD-10-CM | POA: Diagnosis not present

## 2017-08-18 DIAGNOSIS — M19071 Primary osteoarthritis, right ankle and foot: Secondary | ICD-10-CM | POA: Diagnosis not present

## 2017-08-18 DIAGNOSIS — I10 Essential (primary) hypertension: Secondary | ICD-10-CM

## 2017-08-18 DIAGNOSIS — M19041 Primary osteoarthritis, right hand: Secondary | ICD-10-CM | POA: Diagnosis not present

## 2017-08-18 DIAGNOSIS — M19012 Primary osteoarthritis, left shoulder: Secondary | ICD-10-CM

## 2017-08-18 DIAGNOSIS — M0579 Rheumatoid arthritis with rheumatoid factor of multiple sites without organ or systems involvement: Secondary | ICD-10-CM

## 2017-08-18 DIAGNOSIS — M19042 Primary osteoarthritis, left hand: Secondary | ICD-10-CM

## 2017-08-18 DIAGNOSIS — M19011 Primary osteoarthritis, right shoulder: Secondary | ICD-10-CM | POA: Diagnosis not present

## 2017-08-18 DIAGNOSIS — M170 Bilateral primary osteoarthritis of knee: Secondary | ICD-10-CM

## 2017-08-18 DIAGNOSIS — M19072 Primary osteoarthritis, left ankle and foot: Secondary | ICD-10-CM

## 2017-08-18 DIAGNOSIS — R011 Cardiac murmur, unspecified: Secondary | ICD-10-CM

## 2017-08-18 NOTE — Patient Instructions (Signed)
Standing Labs We placed an order today for your standing lab work.    Please come back and get your standing labs in November and every 3 months   We have open lab Monday through Friday from 8:30-11:30 AM and 1:30-4:00 PM  at the office of Dr. Shaili Deveshwar.   You may experience shorter wait times on Monday and Friday afternoons. The office is located at [address], [address], [place], [place] [address] No appointment is necessary.   Labs are drawn by [facility].  You may receive a bill from [facility] for your lab work. If you have any questions regarding directions or hours of operation,  please call [phone].     

## 2017-08-18 NOTE — Progress Notes (Signed)
Office Visit Note  Patient: Stacy Meyers             Date of Birth: Oct 31, 1932           MRN: [id number]             PCP: Fayrene Helper, MD Referring: Fayrene Helper, MD Visit Date: 08/18/2017 Occupation: @GUAROCC @  Subjective:  Medication monitoring   History of Present Illness: Stacy Meyers is a 84 y.o. female with history of seropositive rheumatoid arthritis and osteoarthritis.  Patient is taking MTX 3 tablets po once weekly and folic acid 1 mg daily.  She is taking Prednisone 4 mg po daily.  She denies any recent rheumatoid arthritis flares.  She denies any joint pain at this time.  She reports right knee joint swelling, but denies any joint pain at this time.  She denies any joint stiffness.  She walks with a cane for assistance.  She denies any recent falls.   She reports over a week ago she had significant fatigue, which has since resolved. She reports she has had a migraine for the past several days, but she denies any nausea, blurry vision, or changes in vision.  She denies any recent fevers.  She denies needing any refills of medications at this time.    Activities of Daily Living:  Patient reports morning stiffness for 0  minutes.   Patient Denies nocturnal pain.  Difficulty dressing/grooming: Denies Difficulty climbing stairs: Reports Difficulty getting out of chair: Reports Difficulty using hands for taps, buttons, cutlery, and/or writing: Denies  Review of Systems  Constitutional: Negative for fatigue.  HENT: Positive for mouth dryness. Negative for mouth sores and nose dryness.   Eyes: Negative for pain, visual disturbance and dryness.  Respiratory: Negative for cough, hemoptysis, shortness of breath and difficulty breathing.   Cardiovascular: Negative for chest pain, palpitations, hypertension and swelling in legs/feet.  Gastrointestinal: Negative for blood in stool, constipation and diarrhea.  Endocrine: Negative for increased urination.    Genitourinary: Negative for painful urination.  Musculoskeletal: Negative for arthralgias, joint pain, joint swelling, myalgias, muscle weakness, morning stiffness, muscle tenderness and myalgias.  Skin: Negative for color change, pallor, rash, hair loss, nodules/bumps, skin tightness, ulcers and sensitivity to sunlight.  Allergic/Immunologic: Negative for susceptible to infections.  Neurological: Positive for headaches. Negative for dizziness, numbness and weakness.  Hematological: Negative for swollen glands.  Psychiatric/Behavioral: Negative for depressed mood and sleep disturbance. The patient is not nervous/anxious.     PMFS History:  Patient Active Problem List   Diagnosis Date Noted  . Fibroids 05/08/2016  . Spondylosis of lumbar region without myelopathy or radiculopathy 12/24/2015  . Solitary bone cyst of right shoulder 05/28/2015  . Right knee pain 05/28/2015  . Osteoarthritis of both shoulders 05/28/2015  . Ganglion cyst of wrist 05/11/2012  . Systolic murmur 37/90/2409  . Diastolic dysfunction 73/53/2992  . Seasonal allergies 08/28/2010  . Type 2 diabetes, diet controlled ([place]) 07/25/2009  . Herpes simplex virus (HSV) infection 09/16/2008  . Rheumatoid arthritis with rheumatoid factor of multiple sites without organ or systems involvement ([place]) 09/16/2008  . Hyperlipidemia LDL goal <100 05/23/2008  . Essential hypertension, benign 05/23/2008    Past Medical History:  Diagnosis Date  . Acute cholecystitis 12/16/2010  . Anemia 12/14/2010  . ARF (acute renal failure) ([place]) 12/14/2010  . Bradycardia    Sinus - noted 6/11  . Cholelithiasis 12/14/2010  . Collagen vascular disease ([place])   . Diastolic dysfunction 42/68/3419  Grade 1 per 2-D echocardiogram  . Dysrhythmia   . Essential hypertension, benign   . HTN (hypertension) 12/14/2010  . Hypercalcemia 12/14/2010  . Hypokalemia 12/14/2010  . Mixed hyperlipidemia   . Osteoarthritis   . Rheumatoid arthritis(714.0)      Family History  Problem Relation Age of Onset  . Stroke Mother   . Cancer Father   . Diabetes Sister   . Diabetes Sister   . Kidney failure Brother   . Kidney failure Brother   . Pancreatic cancer Sister   . Kidney Stones Sister        Only one Kidney  . Arthritis Sister   . Heart disease Daughter    Past Surgical History:  Procedure Laterality Date  . Bilateral cataract surgery  2009  . BUNIONECTOMY Right 2012  . CHOLECYSTECTOMY  12/17/2010   Procedure: LAPAROSCOPIC CHOLECYSTECTOMY;  Surgeon: Donato Heinz;  Location: [facility];  Service: General;  Laterality: N/A;  . COLONOSCOPY  05/22/2011   Procedure: COLONOSCOPY;  Surgeon: Rogene Houston, MD;  Location: [facility];  Service: Endoscopy;  Laterality: N/A;  1045  . EYE SURGERY Bilateral    cataract  . GANGLION CYST EXCISION Left 06/02/2012   Procedure: Excision ganglion left dorsal wrist;  Surgeon: Sanjuana Kava, MD;  Location: [facility];  Service: Orthopedics;  Laterality: Left;  . L2-L3 discectomy  2002  . L3-L4, L4-L5 lumbar decompression  2011  . Right shoulder cyst excision  2010  . SPINE SURGERY     twice, approx 2006, 2011   Social History   Social History Narrative   Husband passed away 05-04-16. It has been financially harder to pay the bills since his passing, but she is making it. Has grandchildren around which makes it easier.     Objective: Vital Signs: BP (!) 154/72 (BP Location: Left Arm, Patient Position: Sitting, Cuff Size: Normal)   Pulse (!) 59   Resp 16   Ht 4\' 11"  (1.499 m)   Wt 140 lb 9.6 oz (63.8 kg)   BMI 28.40 kg/m    Physical Exam  Constitutional: She is oriented to person, place, and time. She appears well-developed and well-nourished.  HENT:  Head: Normocephalic and atraumatic.  Eyes: Conjunctivae and EOM are normal.  Neck: Normal range of motion.  Cardiovascular: Normal rate, regular rhythm, normal heart sounds and intact distal pulses.  Pulmonary/Chest: Effort normal and  breath sounds normal.  Abdominal: Soft. Bowel sounds are normal.  Lymphadenopathy:    She has no cervical adenopathy.  Neurological: She is alert and oriented to person, place, and time.  Skin: Skin is warm and dry. Capillary refill takes less than 2 seconds.  Psychiatric: She has a normal mood and affect. Her behavior is normal.  Nursing note and vitals reviewed.    Musculoskeletal Exam: C-spine good ROM.  Thoracic kyphosis noted.  Limited ROM of lumbar spine.  No midline spinal tenderness.  No SI joint tenderness.  Limited ROM of bilateral shoulder joints.  Bilateral shoulder synovial thickening.  Elbow joints full ROM with no discomfort.  Ulnar deviation of left MCPs.  Synovial thickening of MCPs but no synovitis noted.  Extensor tenosynovitis of left wrist but no tenderness. Valgus deformity of right knee joint.  Right knee warmth and inflammation.  Left knee no warmth or effusion. Bilateral ankle synovial thickening. No tenderness of trochanteric bursa bilaterally.   CDAI Exam: CDAI Score: 2.4  Patient Global Assessment: 2 (mm); Provider Global Assessment: 2 (mm) Swollen:  2 ; Tender: 0  Joint Exam      Right  Left  Wrist     Swollen   Knee  Swollen         Investigation: No additional findings.  Imaging: No results found.  Recent Labs: Lab Results  Component Value Date   WBC 6.3 08/13/2017   HGB 13.0 08/13/2017   PLT 286 08/13/2017   NA 143 08/13/2017   K 3.9 08/13/2017   CL 107 08/13/2017   CO2 29 08/13/2017   GLUCOSE 112 08/13/2017   BUN 21 08/13/2017   CREATININE 0.96 (H) 08/13/2017   BILITOT 0.5 08/13/2017   ALKPHOS 57 07/14/2016   AST 14 08/13/2017   ALT 8 08/13/2017   PROT 6.5 08/13/2017   ALBUMIN 4.1 07/14/2016   CALCIUM 9.8 08/13/2017   GFRAA 63 08/13/2017    Speciality Comments: No specialty comments available.  Procedures:  No procedures performed Allergies: Aspirin and Sudafed [pseudoephedrine hcl]   Assessment / Plan:     Visit Diagnoses:  Rheumatoid arthritis with rheumatoid factor of multiple sites without organ or systems involvement ([place]): She has long-standing extensor tenosynovitis of the left wrist and inflammation of the right knee joint on exam.  She has no tenderness or discomfort at this time.  She has synovial thickening of MCPs but no synovitis or tenderness noted.  She denies any joint pain at this time.  She will continue on the current treatment regimen of MTX 3 tablets po once weekly, folic acid 1 mg daily, and Prednisone 4 mg po daily.  She does not need any refills at this time.  She was advised to notify us if she develops increased joint pain or joint swelling.  She will follow up in the office in 5 months.   High risk medication use - MTX 3 tablets and folic acid 1 mg, Prednisone 4 mg.  Creatinine and GFR stable and CBC WNL on 08/13/17.  She will return for CBC and CMP in November and every 3 months to monitor for drug toxicity.  She has standing orders in place.   Primary osteoarthritis of both hands: Joint protection and muscle strengthening were discussed.   Primary osteoarthritis of both shoulders: Limited ROM.  She has synovial thickening of bilateral shoulder joints.    Primary osteoarthritis of both knees: She has warmth and swelling of the right knee joint on exam. Valgus deformity of right knee joint.  Left knee joint no warmth or effusion.   Primary osteoarthritis of both feet:  She has osteoarthritic changes of both feet.  She has no discomfort at this time.  She wears proper fitting shoes.   Spondylosis of lumbar region without myelopathy or radiculopathy: No midline spinal tenderness. She has limited ROM. She has no discomfort at this time.   Other medical conditions are listed as follows:   Tinea cruris  Essential hypertension  Type 2 diabetes, diet controlled ([facility])  Systolic murmur   Orders: No orders of the defined types were placed in this encounter.  No orders of the defined types were  placed in this encounter.     Follow-Up Instructions: Return in about 5 months (around 01/18/2018) for Rheumatoid arthritis, Osteoarthritis.   Ofilia Neas, PA-C I examined and evaluated the patient with Hazel Sams PA.  Patient had some synovial thickening on examination.  She appears to be doing well on combination therapy with methotrexate and prednisone.  She is not inclined towards changing or adding DMARDs.  The plan  of care was discussed as noted above.  Bo Merino, MD Note - This record has been created using Editor, commissioning.  Chart creation errors have been sought, but may not always  have been located. Such creation errors do not reflect on  the standard of medical care.

## 2017-08-19 ENCOUNTER — Encounter: Payer: Self-pay | Admitting: Family Medicine

## 2017-08-19 NOTE — Assessment & Plan Note (Signed)
Deteriorated. Patient re-educated about  the importance of commitment to a  minimum of 150 minutes of exercise per week.  The importance of healthy food choices with portion control discussed. Encouraged to start a food diary, count calories and to consider  joining a support group. Sample diet sheets offered. Goals set by the patient for the next several months.   Weight /BMI 08/18/2017 08/16/2017 07/14/2017  WEIGHT 140 lb 9.6 oz 139 lb 1.3 oz 143 lb  HEIGHT 4\' 11"  4\' 11"  5\' 2"   BMI 28.4 kg/m2 28.09 kg/m2 26.16 kg/m2

## 2017-08-19 NOTE — Progress Notes (Signed)
Stacy Meyers     MRN: [id number]      DOB: Oct 06, 1932   HPI Stacy Meyers is here for follow up and re-evaluation of chronic medical conditions, medication management and review of any available recent lab and radiology data.  Preventive health is updated, specifically  Cancer screening and Immunization.   Questions or concerns regarding consultations or procedures which the PT has had in the interim are  addressed. The PT denies any adverse reactions to current medications since the last visit.  There are no new concerns.  There are no specific complaints   ROS Denies recent fever or chills. Denies sinus pressure, nasal congestion, ear pain or sore throat. Denies chest congestion, productive cough or wheezing. Denies chest pains, palpitations and leg swelling Denies abdominal pain, nausea, vomiting,diarrhea or constipation.   Denies dysuria, frequency, hesitancy or incontinence. Denies uncontrolled  joint pain, does have swelling and limitation in mobility. Denies headaches, seizures, numbness, or tingling. Denies depression, anxiety or insomnia. Denies skin break down or rash.   PE  BP 130/68   Pulse 62   Resp 12   Ht 4\' 11"  (1.499 m)   Wt 139 lb 1.3 oz (63.1 kg)   SpO2 92% Comment: room air  BMI 28.09 kg/m   Patient alert and oriented and in no cardiopulmonary distress.  HEENT: No facial asymmetry, EOMI,   oropharynx pink and moist.  Neck supple no JVD, no mass.  Chest: Clear to auscultation bilaterally.  CVS: S1, S2 no murmurs, no S3.Regular rate.  ABD: Soft non tender.   Ext: No edema  MS: Adequate though reduced  ROM spine, shoulders, hips and knees.  Skin: Intact, no ulcerations or rash noted.  Psych: Good eye contact, normal affect. Memory intact not anxious or depressed appearing.  CNS: CN 2-12 intact, power,  normal throughout.no focal deficits noted.   Assessment & Plan  Type 2 diabetes, diet controlled ([place]) Stacy Meyers is reminded of the importance  of commitment to daily physical activity for 30 minutes or more, as able and the need to limit carbohydrate intake to 30 to 60 grams per meal to help with blood sugar control.    Stacy Meyers is reminded of the importance of daily foot exam, annual eye examination, and good blood sugar, blood pressure and cholesterol control.  Diabetic Labs Latest Ref Rng & Units 08/13/2017 03/12/2017 01/07/2017 11/30/2016 10/07/2016  HbA1c <5.7 % of total Hgb - - - - -  Microalbumin mg/dL - - - - 0.8  Micro/Creat Ratio <30 mcg/mg creat - - - - 12  Chol <200 mg/dL - - 166 - -  HDL >50 mg/dL - - 66 - -  Calc LDL mg/dL (calc) - - 83 - -  Triglycerides <150 mg/dL - - 83 - -  Creatinine 0.60 - 0.88 mg/dL 0.96(H) 0.94(H) 0.89(H) 0.97(H) -   BP/Weight 08/18/2017 08/16/2017 07/14/2017 05/24/2017 03/12/2017 01/13/2017 62/02/2977  Systolic BP 892 119 417 408 144 818 563  Diastolic BP 72 68 64 64 65 80 80  Wt. (Lbs) 140.6 139.08 143 140.12 136 135 135.5  BMI 28.4 28.09 26.16 28.3 25.7 27.27 27.37   Foot/eye exam completion dates Latest Ref Rng & Units 08/16/2017 10/07/2016  Eye Exam No Retinopathy - -  Foot Form Completion - Done Done         Essential hypertension, benign Controlled, no change in medication DASH diet and commitment to daily physical activity for a minimum of 30 minutes discussed and encouraged, as  a part of hypertension management. The importance of attaining a healthy weight is also discussed.  BP/Weight 08/18/2017 08/16/2017 07/14/2017 05/24/2017 03/12/2017 01/13/2017 01/11/5100  Systolic BP 585 277 824 235 361 443 154  Diastolic BP 72 68 64 64 65 80 80  Wt. (Lbs) 140.6 139.08 143 140.12 136 135 135.5  BMI 28.4 28.09 26.16 28.3 25.7 27.27 27.37       Rheumatoid arthritis with rheumatoid factor of multiple sites without organ or systems involvement [facility]) Managed by rheumatology and doing well on treatment  Overweight (BMI 25.0-29.9) Deteriorated. Patient re-educated about  the importance of commitment to  a  minimum of 150 minutes of exercise per week.  The importance of healthy food choices with portion control discussed. Encouraged to start a food diary, count calories and to consider  joining a support group. Sample diet sheets offered. Goals set by the patient for the next several months.   Weight /BMI 08/18/2017 08/16/2017 07/14/2017  WEIGHT 140 lb 9.6 oz 139 lb 1.3 oz 143 lb  HEIGHT 4\' 11"  4\' 11"  5\' 2"   BMI 28.4 kg/m2 28.09 kg/m2 26.16 kg/m2      Hyperlipidemia LDL goal <100 Hyperlipidemia:Low fat diet discussed and encouraged.   Lipid Panel  Lab Results  Component Value Date   CHOL 166 01/07/2017   HDL 66 01/07/2017   LDLCALC 83 01/07/2017   TRIG 83 01/07/2017   CHOLHDL 2.5 01/07/2017   Controlled, no change in medication

## 2017-08-19 NOTE — Assessment & Plan Note (Signed)
Controlled, no change in medication DASH diet and commitment to daily physical activity for a minimum of 30 minutes discussed and encouraged, as a part of hypertension management. The importance of attaining a healthy weight is also discussed.  BP/Weight 08/18/2017 08/16/2017 07/14/2017 05/24/2017 03/12/2017 01/13/2017 32/05/4980  Systolic BP 641 583 094 076 808 811 031  Diastolic BP 72 68 64 64 65 80 80  Wt. (Lbs) 140.6 139.08 143 140.12 136 135 135.5  BMI 28.4 28.09 26.16 28.3 25.7 27.27 27.37

## 2017-08-19 NOTE — Assessment & Plan Note (Signed)
Hyperlipidemia:Low fat diet discussed and encouraged.   Lipid Panel  Lab Results  Component Value Date   CHOL 166 01/07/2017   HDL 66 01/07/2017   LDLCALC 83 01/07/2017   TRIG 83 01/07/2017   CHOLHDL 2.5 01/07/2017   Controlled, no change in medication

## 2017-08-19 NOTE — Assessment & Plan Note (Signed)
Stacy Meyers is reminded of the importance of commitment to daily physical activity for 30 minutes or more, as able and the need to limit carbohydrate intake to 30 to 60 grams per meal to help with blood sugar control.    Stacy Meyers is reminded of the importance of daily foot exam, annual eye examination, and good blood sugar, blood pressure and cholesterol control.  Diabetic Labs Latest Ref Rng & Units 08/13/2017 03/12/2017 01/07/2017 11/30/2016 10/07/2016  HbA1c <5.7 % of total Hgb - - - - -  Microalbumin mg/dL - - - - 0.8  Micro/Creat Ratio <30 mcg/mg creat - - - - 12  Chol <200 mg/dL - - 166 - -  HDL >50 mg/dL - - 66 - -  Calc LDL mg/dL (calc) - - 83 - -  Triglycerides <150 mg/dL - - 83 - -  Creatinine 0.60 - 0.88 mg/dL 0.96(H) 0.94(H) 0.89(H) 0.97(H) -   BP/Weight 08/18/2017 08/16/2017 07/14/2017 05/24/2017 03/12/2017 01/13/2017 83/07/2900  Systolic BP 111 552 080 223 361 224 497  Diastolic BP 72 68 64 64 65 80 80  Wt. (Lbs) 140.6 139.08 143 140.12 136 135 135.5  BMI 28.4 28.09 26.16 28.3 25.7 27.27 27.37   Foot/eye exam completion dates Latest Ref Rng & Units 08/16/2017 10/07/2016  Eye Exam No Retinopathy - -  Foot Form Completion - Done Done

## 2017-08-19 NOTE — Assessment & Plan Note (Signed)
Managed by rheumatology and doing well on treatment

## 2017-08-20 ENCOUNTER — Telehealth: Payer: Self-pay

## 2017-08-20 ENCOUNTER — Other Ambulatory Visit: Payer: Self-pay | Admitting: Family Medicine

## 2017-08-20 DIAGNOSIS — E1169 Type 2 diabetes mellitus with other specified complication: Secondary | ICD-10-CM

## 2017-08-20 DIAGNOSIS — R7989 Other specified abnormal findings of blood chemistry: Secondary | ICD-10-CM

## 2017-08-20 DIAGNOSIS — I10 Essential (primary) hypertension: Secondary | ICD-10-CM

## 2017-08-20 DIAGNOSIS — E785 Hyperlipidemia, unspecified: Secondary | ICD-10-CM

## 2017-08-20 DIAGNOSIS — E559 Vitamin D deficiency, unspecified: Secondary | ICD-10-CM

## 2017-08-20 DIAGNOSIS — M0579 Rheumatoid arthritis with rheumatoid factor of multiple sites without organ or systems involvement: Secondary | ICD-10-CM

## 2017-08-20 DIAGNOSIS — E669 Obesity, unspecified: Principal | ICD-10-CM

## 2017-08-20 MED ORDER — UNABLE TO FIND: 0 refills | Status: DC

## 2017-08-20 NOTE — Telephone Encounter (Signed)
Diabetic shoes rx sent

## 2017-08-27 ENCOUNTER — Other Ambulatory Visit: Payer: Self-pay | Admitting: Rheumatology

## 2017-08-27 NOTE — Telephone Encounter (Signed)
Last Visit: 08/18/17 Next visit: 01/18/18 Labs: 08/13/17 Creatinine and GFR are stable. All other labs are WNL.  Okay to refill per Dr. Rob Hickman

## 2017-09-18 ENCOUNTER — Other Ambulatory Visit: Payer: Self-pay | Admitting: Family Medicine

## 2017-09-19 ENCOUNTER — Other Ambulatory Visit: Payer: Self-pay | Admitting: Family Medicine

## 2017-09-21 ENCOUNTER — Other Ambulatory Visit: Payer: Self-pay | Admitting: Rheumatology

## 2017-09-21 NOTE — Telephone Encounter (Signed)
Last Visit: 08/18/17 Next visit: 01/18/18  Okay to refill per Dr. Deveshwar 

## 2017-09-24 DIAGNOSIS — M069 Rheumatoid arthritis, unspecified: Secondary | ICD-10-CM | POA: Diagnosis not present

## 2017-09-24 DIAGNOSIS — N182 Chronic kidney disease, stage 2 (mild): Secondary | ICD-10-CM | POA: Diagnosis not present

## 2017-09-24 DIAGNOSIS — I129 Hypertensive chronic kidney disease with stage 1 through stage 4 chronic kidney disease, or unspecified chronic kidney disease: Secondary | ICD-10-CM | POA: Diagnosis not present

## 2017-10-18 DIAGNOSIS — B351 Tinea unguium: Secondary | ICD-10-CM | POA: Diagnosis not present

## 2017-10-18 DIAGNOSIS — E1140 Type 2 diabetes mellitus with diabetic neuropathy, unspecified: Secondary | ICD-10-CM | POA: Diagnosis not present

## 2017-11-11 ENCOUNTER — Other Ambulatory Visit: Payer: Self-pay | Admitting: Family Medicine

## 2017-11-11 DIAGNOSIS — Z09 Encounter for follow-up examination after completed treatment for conditions other than malignant neoplasm: Secondary | ICD-10-CM

## 2017-11-23 ENCOUNTER — Encounter (HOSPITAL_COMMUNITY): Payer: Medicare (Managed Care)

## 2017-11-23 ENCOUNTER — Ambulatory Visit (HOSPITAL_COMMUNITY): Payer: Medicare (Managed Care)

## 2017-11-24 ENCOUNTER — Other Ambulatory Visit: Payer: Self-pay | Admitting: Rheumatology

## 2017-11-24 NOTE — Telephone Encounter (Addendum)
Last Visit: 08/18/17 Next visit: 01/18/18 Labs: 08/13/17 Creatinine and GFR are stable. All other labs are WNL.  Attempted to contact patient to advise she is due for labs. No answer and unable to leave a message.

## 2017-11-29 DIAGNOSIS — Z961 Presence of intraocular lens: Secondary | ICD-10-CM | POA: Diagnosis not present

## 2017-11-29 DIAGNOSIS — H04123 Dry eye syndrome of bilateral lacrimal glands: Secondary | ICD-10-CM | POA: Diagnosis not present

## 2017-11-29 DIAGNOSIS — M069 Rheumatoid arthritis, unspecified: Secondary | ICD-10-CM | POA: Diagnosis not present

## 2017-11-29 DIAGNOSIS — H35363 Drusen (degenerative) of macula, bilateral: Secondary | ICD-10-CM | POA: Diagnosis not present

## 2017-11-29 LAB — HM DIABETES EYE EXAM

## 2017-12-06 ENCOUNTER — Telehealth: Payer: Self-pay | Admitting: *Deleted

## 2017-12-06 NOTE — Telephone Encounter (Signed)
Pt stated she was supposed to have a breast scan done and she had to cancel because she could not afford it. They could not tell her how much she would pay when she called they told her to check with her primary. Pt just wanted someone to call her so she could discuss.

## 2017-12-07 ENCOUNTER — Other Ambulatory Visit: Payer: Self-pay | Admitting: Family Medicine

## 2017-12-07 DIAGNOSIS — R921 Mammographic calcification found on diagnostic imaging of breast: Secondary | ICD-10-CM

## 2017-12-07 DIAGNOSIS — Z09 Encounter for follow-up examination after completed treatment for conditions other than malignant neoplasm: Secondary | ICD-10-CM

## 2017-12-07 NOTE — Telephone Encounter (Signed)
Spoke with patient advised her to call her insurance company, and they would be able to give her a better answer about her benefits and coverage.

## 2017-12-14 ENCOUNTER — Other Ambulatory Visit: Payer: Self-pay | Admitting: Rheumatology

## 2017-12-14 NOTE — Telephone Encounter (Signed)
Last Visit: 08/18/17 Next visit: 01/18/18  Okay to refill per Dr. Estanislado Pandy

## 2017-12-21 ENCOUNTER — Ambulatory Visit (HOSPITAL_COMMUNITY): Payer: Medicare Other

## 2017-12-21 ENCOUNTER — Ambulatory Visit (HOSPITAL_COMMUNITY)
Admission: RE | Admit: 2017-12-21 | Discharge: 2017-12-21 | Disposition: A | Discharge status: Home / Self Care | Payer: Medicare Other | Source: Ambulatory Visit | Attending: Family Medicine | Admitting: Family Medicine

## 2017-12-21 DIAGNOSIS — R921 Mammographic calcification found on diagnostic imaging of breast: Secondary | ICD-10-CM | POA: Diagnosis not present

## 2017-12-21 DIAGNOSIS — Z09 Encounter for follow-up examination after completed treatment for conditions other than malignant neoplasm: Secondary | ICD-10-CM | POA: Insufficient documentation

## 2018-01-04 NOTE — Progress Notes (Signed)
Office Visit Note  Patient: Stacy Meyers             Date of Birth: 1932-01-24           MRN: [id number]             PCP: Fayrene Helper, MD Referring: Fayrene Helper, MD Visit Date: 01/18/2018 Occupation: @GUAROCC @  Subjective:  Muscle strain    History of Present Illness: Stacy Meyers is a 84 y.o. female with history of seropositive rheumatoid arthritis and osteoarthritis.  She is on MTX 3 tablets by mouth once weekly, folic acid 1 mg po daily, and prednisone 4 mg po daily.  She reports that 2 weeks ago she was moving her bed and strained a muscle in her lower back.  She reports that she followed up with her PCP yesterday who started her on prednisone 5 mg twice daily for 5 days as well as Zanaflex 4 mg by mouth at bedtime for muscle spasms.  She reports when she completes the short course of prednisone she will resume taking 4 mg by mouth daily.  She denies any recent rheumatoid arthritis flares.  She denies any joint pain or joint swelling at this time.  She states that she continues to walk with a cane for stability.   Activities of Daily Living:  Patient reports morning stiffness for 1 hour.   Patient Denies nocturnal pain.  Difficulty dressing/grooming: Denies Difficulty climbing stairs: Reports Difficulty getting out of chair: Reports Difficulty using hands for taps, buttons, cutlery, and/or writing: Denies  Review of Systems  Constitutional: Negative for fatigue.  HENT: Positive for mouth dryness. Negative for mouth sores and nose dryness.   Eyes: Negative for pain, visual disturbance and dryness.  Respiratory: Negative for cough, hemoptysis, shortness of breath and difficulty breathing.   Cardiovascular: Negative for chest pain, palpitations, hypertension and swelling in legs/feet.  Gastrointestinal: Negative for blood in stool, constipation and diarrhea.  Endocrine: Negative for increased urination.  Genitourinary: Negative for painful urination.    Musculoskeletal: Positive for morning stiffness and muscle tenderness. Negative for arthralgias, joint pain, joint swelling, myalgias, muscle weakness and myalgias.  Skin: Positive for color change. Negative for pallor, rash, hair loss, nodules/bumps, skin tightness, ulcers and sensitivity to sunlight.  Allergic/Immunologic: Negative for susceptible to infections.  Neurological: Negative for dizziness, numbness, headaches and weakness.  Hematological: Negative for swollen glands.  Psychiatric/Behavioral: Negative for depressed mood and sleep disturbance. The patient is not nervous/anxious.     PMFS History:  Patient Active Problem List   Diagnosis Date Noted  . Fibroids 05/08/2016  . Spondylosis of lumbar region without myelopathy or radiculopathy 12/24/2015  . Solitary bone cyst of right shoulder 05/28/2015  . Right knee pain 05/28/2015  . Osteoarthritis of both shoulders 05/28/2015  . Overweight (BMI 25.0-29.9) 04/28/2014  . Ganglion cyst of wrist 05/11/2012  . Systolic murmur 62/13/0865  . Diastolic dysfunction 78/46/9629  . Seasonal allergies 08/28/2010  . Type 2 diabetes, diet controlled ([place]) 07/25/2009  . Herpes simplex virus (HSV) infection 09/16/2008  . Rheumatoid arthritis with rheumatoid factor of multiple sites without organ or systems involvement ([place]) 09/16/2008  . Hyperlipidemia LDL goal <100 05/23/2008  . Essential hypertension, benign 05/23/2008    Past Medical History:  Diagnosis Date  . Acute cholecystitis 12/16/2010  . Anemia 12/14/2010  . ARF (acute renal failure) ([place]) 12/14/2010  . Bradycardia    Sinus - noted 6/11  . Cholelithiasis 12/14/2010  . Collagen vascular disease ([place])   .  Diastolic dysfunction 53/61/4431   Grade 1 per 2-D echocardiogram  . Dysrhythmia   . Essential hypertension, benign   . HTN (hypertension) 12/14/2010  . Hypercalcemia 12/14/2010  . Hypokalemia 12/14/2010  . Mixed hyperlipidemia   . Osteoarthritis   . Rheumatoid  arthritis(714.0)     Family History  Problem Relation Age of Onset  . Stroke Mother   . Cancer Father   . Diabetes Sister   . Diabetes Sister   . Kidney failure Brother   . Kidney failure Brother   . Pancreatic cancer Sister   . Kidney Stones Sister        Only one Kidney  . Arthritis Sister   . Heart disease Daughter    Past Surgical History:  Procedure Laterality Date  . Bilateral cataract surgery  2009  . BUNIONECTOMY Right 2012  . CHOLECYSTECTOMY  12/17/2010   Procedure: LAPAROSCOPIC CHOLECYSTECTOMY;  Surgeon: Donato Heinz;  Location: [facility];  Service: General;  Laterality: N/A;  . COLONOSCOPY  05/22/2011   Procedure: COLONOSCOPY;  Surgeon: Rogene Houston, MD;  Location: [facility];  Service: Endoscopy;  Laterality: N/A;  1045  . EYE SURGERY Bilateral    cataract  . GANGLION CYST EXCISION Left 06/02/2012   Procedure: Excision ganglion left dorsal wrist;  Surgeon: Sanjuana Kava, MD;  Location: [facility];  Service: Orthopedics;  Laterality: Left;  . L2-L3 discectomy  2002  . L3-L4, L4-L5 lumbar decompression  2011  . Right shoulder cyst excision  2010  . SPINE SURGERY     twice, approx 2006, 2011   Social History   Social History Narrative   Husband passed away 04/29/2016. It has been financially harder to pay the bills since his passing, but she is making it. Has grandchildren around which makes it easier.     Objective: Vital Signs: BP (!) 153/73 (BP Location: Left Arm, Patient Position: Sitting, Cuff Size: Normal)   Pulse (!) 59   Resp 14   Ht 4\' 11"  (1.499 m)   Wt 134 lb 9.6 oz (61.1 kg)   BMI 27.19 kg/m    Physical Exam Vitals signs and nursing note reviewed.  Constitutional:      Appearance: She is well-developed.  HENT:     Head: Normocephalic and atraumatic.  Eyes:     Conjunctiva/sclera: Conjunctivae normal.  Neck:     Musculoskeletal: Normal range of motion.  Cardiovascular:     Rate and Rhythm: Rhythm irregular.     Heart sounds: Normal  heart sounds.  Pulmonary:     Effort: Pulmonary effort is normal.     Breath sounds: Normal breath sounds.  Abdominal:     General: Bowel sounds are normal.     Palpations: Abdomen is soft.  Lymphadenopathy:     Cervical: No cervical adenopathy.  Skin:    General: Skin is warm and dry.     Capillary Refill: Capillary refill takes less than 2 seconds.  Neurological:     Mental Status: She is alert and oriented to person, place, and time.  Psychiatric:        Behavior: Behavior normal.      Musculoskeletal Exam: C-spine slightly limited range of motion with lateral rotation.  Thoracic kyphosis noted.  She is limited range of motion of the lumbar spine.  No midline spinal tenderness on exam.  No SI joint tenderness.  Limited shoulder abduction to 120 degrees bilaterally.  She has synovial thickening of bilateral shoulder joints.  Elbow joints  full range of motion with no tenderness or synovitis.  She has slightly limited range of motion of bilateral wrist joints.  She has ulnar deviation of MCPs.  No synovitis noted.  Valgus deformity of the right knee joint.  She has right knee synovial thickening and mild warmth noted.  No warmth or effusion of the left knee joint.  She is synovial thickening of bilateral ankle joints.  No tenderness over trochanteric bursa bilaterally.  CDAI Exam: CDAI Score: 0.4  Patient Global Assessment: 2 (mm); Provider Global Assessment: 2 (mm) Swollen: 0 ; Tender: 0  Joint Exam   Not documented   There is currently no information documented on the homunculus. Go to the Rheumatology activity and complete the homunculus joint exam.  Investigation: No additional findings.  Imaging: Mm Diag Breast Tomo Uni Right  Result Date: 12/21/2017 CLINICAL DATA:  Short-term follow-up for right breast calcifications.Calcifications were initially evaluated on 05/10/2017. EXAM: DIGITAL DIAGNOSTIC UNILATERAL RIGHT MAMMOGRAM WITH CAD AND TOMO COMPARISON:  Previous exam(s).  ACR Breast Density Category b: There are scattered areas of fibroglandular density. FINDINGS: The small cluster of calcifications in the outer right breast are without change. There are no masses or new calcifications. There are no areas of architectural distortion. Mammographic images were processed with CAD. IMPRESSION: 1. Probably benign small cluster of calcifications in the right breast stable for 6 months. Additional short-term follow-up recommended. RECOMMENDATION: Diagnostic mammography with right breast magnification views in 6 months. I have discussed the findings and recommendations with the patient. Results were also provided in writing at the conclusion of the visit. If applicable, a reminder letter will be sent to the patient regarding the next appointment. BI-RADS CATEGORY  3: Probably benign. Electronically Signed   By: Lajean Manes M.D.   On: 12/21/2017 15:43    Recent Labs: Lab Results  Component Value Date   WBC 5.5 01/13/2018   HGB 13.5 01/13/2018   PLT 291 01/13/2018   NA 143 01/13/2018   K 4.1 01/13/2018   CL 103 01/13/2018   CO2 30 01/13/2018   GLUCOSE 107 (H) 01/13/2018   BUN 18 01/13/2018   CREATININE 0.92 (H) 01/13/2018   BILITOT 0.3 01/13/2018   ALKPHOS 57 07/14/2016   AST 13 01/13/2018   ALT 8 01/13/2018   PROT 6.9 01/13/2018   ALBUMIN 4.1 07/14/2016   CALCIUM 11.1 (H) 01/13/2018   GFRAA 67 01/13/2018    Speciality Comments: No specialty comments available.  Procedures:  No procedures performed   Allergies: Aspirin and Sudafed [pseudoephedrine hcl]     Assessment / Plan:     Visit Diagnoses: Rheumatoid arthritis with rheumatoid factor of multiple sites without organ or systems involvement ([place]) -She has no synovitis on exam.  She has not had any recent rheumatoid arthritis flares.  She has mild warmth and synovial thickening of the right knee joint on exam.  She denies any joint pain at this time.  She has morning stiffness lasting about 1 hour.  She  is clinically doing well on methotrexate 3 tablets by mouth once weekly, folic acid 1 mg by mouth daily, and prednisone 4 mg by mouth daily.  She was started on a short course of prednisone 5 mg BID for treatment of a lower back muscle strain.  When she finishes the 5-day course of prednisone she will resume taking prednisone 4 mg by mouth daily.  A refill of methotrexate was sent to the pharmacy today.  She was advised to notify us if she  develops increased joint pain or joint swelling.  She will follow-up in the office in 5 months.  High risk medication use - MTX 3 tablets and folic acid 1 mg, Prednisone 4 mg.  CBC and CMP were drawn on 01/13/2018.  She will return for lab work in April and every 3 months to monitor for drug toxicity.  She received Pneumovax 23 and Prevnar 13.  She had the annual influenza vaccination.  She denied any recent infections.  Primary osteoarthritis of both hands: She has PIP and DIP synovial thickening assist with osteoarthritis of bilateral hands.  She has no synovitis on exam.  Joint protection and muscle strengthening were discussed.  Primary osteoarthritis of both shoulders: She has synovial thickening of bilateral shoulder joints.  She has limited abduction to 120 degrees bilaterally.  Primary osteoarthritis of both knees: She has synovial thickening and a valgus deformity of the right knee joint on exam.  She has mild warmth of the right knee.  No warmth or effusion of the left knee joint.  She walks with a cane.   Primary osteoarthritis of both feet: She has osteoarthritic changes in bilateral feet.  She has no discomfort in her feet at this time.  She wears proper fitting shoes.  Spondylosis of lumbar region without myelopathy or radiculopathy: No midline spinal tenderness.  She has limited range of motion with some discomfort.  She was moving her bed about 2 weeks ago and strained a muscle in her lower back.  She was evaluated by her primary care yesterday who  started her on a 5-day course of prednisone and Zanaflex 4 mg by mouth at bedtime for muscle spasms.  Age-related osteoporosis without current pathological fracture: DEXA October 2014 showed radius T-score of -3.1. She has been on Fosamax weekly since 2014 managed by her PCP Dr. Moshe Cipro.  Other medical conditions are listed as follows:  Type 2 diabetes, diet controlled ([place])  Tinea cruris  Systolic murmur  Essential hypertension   Orders: No orders of the defined types were placed in this encounter.  Meds ordered this encounter  Medications  . methotrexate (RHEUMATREX) 2.5 MG tablet    Sig: TAKE 3 TABLETS BY MOUTH ONCE A WEEK. CAUTION:CHEMOTHERAPY. PROTECT FROM LIGHT.    Dispense:  36 tablet    Refill:  0     Follow-Up Instructions: Return in about 5 months (around 06/19/2018) for Rheumatoid arthritis, Osteoarthritis.   Ofilia Neas, PA-C   I examined and evaluated the patient with Hazel Sams PA.  Patient has been doing well on low-dose methotrexate and prednisone combination.  She does not want to taper prednisone.  She had no synovitis on my examination.  She has some synovial thickening.  The plan of care was discussed as noted above.  Bo Merino, MD  Note - This record has been created using Editor, commissioning.  Chart creation errors have been sought, but may not always  have been located. Such creation errors do not reflect on  the standard of medical care.

## 2018-01-12 ENCOUNTER — Telehealth: Payer: Self-pay | Admitting: Rheumatology

## 2018-01-12 DIAGNOSIS — Z79899 Other long term (current) drug therapy: Secondary | ICD-10-CM

## 2018-01-12 NOTE — Telephone Encounter (Signed)
Patient going to get labs drawn this Friday at [facility] or [facility] in [place]. Patient not sure which one. Please release orders.

## 2018-01-13 ENCOUNTER — Other Ambulatory Visit: Payer: Self-pay | Admitting: Family Medicine

## 2018-01-13 DIAGNOSIS — I10 Essential (primary) hypertension: Secondary | ICD-10-CM | POA: Diagnosis not present

## 2018-01-13 DIAGNOSIS — E119 Type 2 diabetes mellitus without complications: Secondary | ICD-10-CM | POA: Diagnosis not present

## 2018-01-13 DIAGNOSIS — E559 Vitamin D deficiency, unspecified: Secondary | ICD-10-CM | POA: Diagnosis not present

## 2018-01-13 DIAGNOSIS — E785 Hyperlipidemia, unspecified: Secondary | ICD-10-CM | POA: Diagnosis not present

## 2018-01-13 LAB — CBC WITH DIFFERENTIAL/PLATELET
ABSOLUTE MONOCYTES: 473 {cells}/uL (ref 200–950)
BASOS ABS: 61 {cells}/uL (ref 0–200)
Basophils Relative: 1.1 %
Eosinophils Absolute: 352 cells/uL (ref 15–500)
Eosinophils Relative: 6.4 %
HEMATOCRIT: 40.1 % (ref 35.0–45.0)
HEMOGLOBIN: 13.5 g/dL (ref 11.7–15.5)
LYMPHS ABS: 1381 {cells}/uL (ref 850–3900)
MCH: 30.8 pg (ref 27.0–33.0)
MCHC: 33.7 g/dL (ref 32.0–36.0)
MCV: 91.3 fL (ref 80.0–100.0)
MPV: 9.1 fL (ref 7.5–12.5)
Monocytes Relative: 8.6 %
NEUTROS ABS: 3234 {cells}/uL (ref 1500–7800)
Neutrophils Relative %: 58.8 %
Platelets: 291 10*3/uL (ref 140–400)
RBC: 4.39 10*6/uL (ref 3.80–5.10)
RDW: 12.8 % (ref 11.0–15.0)
Total Lymphocyte: 25.1 %
WBC: 5.5 10*3/uL (ref 3.8–10.8)

## 2018-01-13 NOTE — Telephone Encounter (Signed)
Patient states she is going to go to [facility]. Lab Orders have been released.

## 2018-01-14 LAB — COMPLETE METABOLIC PANEL WITH GFR
AG Ratio: 1.6 (calc) (ref 1.0–2.5)
ALBUMIN MSPROF: 4.2 g/dL (ref 3.6–5.1)
ALT: 8 U/L (ref 6–29)
AST: 13 U/L (ref 10–35)
Alkaline phosphatase (APISO): 84 U/L (ref 33–130)
BILIRUBIN TOTAL: 0.3 mg/dL (ref 0.2–1.2)
BUN / CREAT RATIO: 20 (calc) (ref 6–22)
BUN: 18 mg/dL (ref 7–25)
CO2: 30 mmol/L (ref 20–32)
Calcium: 11.1 mg/dL — ABNORMAL HIGH (ref 8.6–10.4)
Chloride: 103 mmol/L (ref 98–110)
Creat: 0.92 mg/dL — ABNORMAL HIGH (ref 0.60–0.88)
GFR, EST AFRICAN AMERICAN: 67 mL/min/{1.73_m2} (ref >=60)
GFR, EST NON AFRICAN AMERICAN: 58 mL/min/{1.73_m2} — AB (ref >=60)
GLUCOSE: 107 mg/dL — AB (ref 65–99)
Globulin: 2.7 g/dL (calc) (ref 1.9–3.7)
Potassium: 4.1 mmol/L (ref 3.5–5.3)
Sodium: 143 mmol/L (ref 135–146)
TOTAL PROTEIN: 6.9 g/dL (ref 6.1–8.1)

## 2018-01-14 LAB — LIPID PANEL
Cholesterol: 163 mg/dL (ref <200)
HDL: 58 mg/dL (ref >50)
LDL Cholesterol (Calc): 83 mg/dL (calc)
Non-HDL Cholesterol (Calc): 105 mg/dL (calc) (ref <130)
TRIGLYCERIDES: 122 mg/dL (ref <150)
Total CHOL/HDL Ratio: 2.8 (calc) (ref <5.0)

## 2018-01-14 LAB — HEMOGLOBIN A1C
HEMOGLOBIN A1C: 6.5 %{Hb} — AB (ref <5.7)
MEAN PLASMA GLUCOSE: 140 (calc)
eAG (mmol/L): 7.7 (calc)

## 2018-01-14 LAB — TSH: TSH: 1.95 mIU/L (ref 0.40–4.50)

## 2018-01-14 LAB — VITAMIN D 25 HYDROXY (VIT D DEFICIENCY, FRACTURES): VIT D 25 HYDROXY: 54 ng/mL (ref 30–100)

## 2018-01-17 ENCOUNTER — Other Ambulatory Visit (HOSPITAL_COMMUNITY)
Admission: RE | Admit: 2018-01-17 | Discharge: 2018-01-17 | Disposition: A | Discharge status: Home / Self Care | Payer: Medicare Other | Source: Ambulatory Visit | Attending: Family Medicine | Admitting: Family Medicine

## 2018-01-17 ENCOUNTER — Encounter: Payer: Self-pay | Admitting: Family Medicine

## 2018-01-17 ENCOUNTER — Ambulatory Visit (INDEPENDENT_AMBULATORY_CARE_PROVIDER_SITE_OTHER): Payer: Medicare Other | Admitting: Family Medicine

## 2018-01-17 VITALS — BP 140/70 | HR 79 | Resp 15 | Ht 59.0 in | Wt 137.0 lb

## 2018-01-17 DIAGNOSIS — M25559 Pain in unspecified hip: Secondary | ICD-10-CM

## 2018-01-17 DIAGNOSIS — E663 Overweight: Secondary | ICD-10-CM | POA: Diagnosis not present

## 2018-01-17 DIAGNOSIS — Z803 Family history of malignant neoplasm of breast: Secondary | ICD-10-CM

## 2018-01-17 DIAGNOSIS — R921 Mammographic calcification found on diagnostic imaging of breast: Secondary | ICD-10-CM | POA: Diagnosis not present

## 2018-01-17 DIAGNOSIS — M549 Dorsalgia, unspecified: Secondary | ICD-10-CM | POA: Diagnosis not present

## 2018-01-17 DIAGNOSIS — M47816 Spondylosis without myelopathy or radiculopathy, lumbar region: Secondary | ICD-10-CM

## 2018-01-17 DIAGNOSIS — E119 Type 2 diabetes mellitus without complications: Secondary | ICD-10-CM | POA: Diagnosis not present

## 2018-01-17 DIAGNOSIS — M0579 Rheumatoid arthritis with rheumatoid factor of multiple sites without organ or systems involvement: Secondary | ICD-10-CM

## 2018-01-17 DIAGNOSIS — I10 Essential (primary) hypertension: Secondary | ICD-10-CM

## 2018-01-17 DIAGNOSIS — E785 Hyperlipidemia, unspecified: Secondary | ICD-10-CM

## 2018-01-17 MED ORDER — ACYCLOVIR 400 MG PO TABS: 400.0000 mg | ORAL_TABLET | Freq: Two times a day (BID) | ORAL | 1 refills | Status: DC

## 2018-01-17 MED ORDER — METHYLPREDNISOLONE ACETATE 80 MG/ML IJ SUSP
40.0000 mg | Freq: Once | INTRAMUSCULAR | Status: AC
  Administered 2018-01-17: 40 mg via INTRAMUSCULAR

## 2018-01-17 MED ORDER — PREDNISONE 5 MG PO TABS: ORAL_TABLET | ORAL | 0 refills | Status: AC

## 2018-01-17 MED ORDER — TIZANIDINE HCL 2 MG PO TABS: ORAL_TABLET | ORAL | 0 refills | Status: DC

## 2018-01-17 NOTE — Patient Instructions (Addendum)
Wellness with nurse due in May 21 or after , -please schedule   F/U with MD in early October, call if you need me before  Pls sched diag  Mammogram due in June of right breast   Excellent labs and BP   Please cut back on sugar so blood sugar stays good  Urine to be sent today from office to check for protein ( microalb)  We will send for your  Eye exam with Dr Jorja Loa,    DeproMedrol 40 mg IM in office today for back and hip pain , and 3 medications are sent in  Start prednisone 5 mg tablet tomorrow, take for 5 days only, then go back to your regular dose

## 2018-01-17 NOTE — Progress Notes (Signed)
Stacy Meyers     MRN: [id number]      DOB: 1932-09-23   HPI Stacy Meyers is here for follow up and re-evaluation of chronic medical conditions, medication management and review of any available recent lab and radiology data.  Preventive health is updated, specifically  Cancer screening and Immunization.   Questions or concerns regarding consultations or procedures which the PT has had in the interim are  addressed. The PT denies any adverse reactions to current medications since the last visit.  2 week h/o low back, right buttock and right hip pain after she tried to move her bed, pushing  Against wall  ROS Denies recent fever or chills. Denies sinus pressure, nasal congestion, ear pain or sore throat. Denies chest congestion, productive cough or wheezing. Denies chest pains, palpitations and leg swelling Denies abdominal pain, nausea, vomiting,diarrhea or constipation.   Denies dysuria, frequency, hesitancy or incontinence. Denies joint pain, swelling and limitation in mobility. Denies headaches, seizures, numbness, or tingling. Denies depression, anxiety or insomnia. Denies skin break down or rash.   PE  BP 140/70   Pulse 79   Resp 15   Ht 4\' 11"  (1.499 m)   Wt 137 lb (62.1 kg)   SpO2 95%   BMI 27.67 kg/m   Patient alert and oriented and in no cardiopulmonary distress.  HEENT: No facial asymmetry, EOMI,   oropharynx pink and moist.  Neck supple no JVD, no mass.  Chest: Clear to auscultation bilaterally.  CVS: S1, S2 no murmurs, no S3.Regular rate.  ABD: Soft non tender.   Ext: No edema  MS: Decreased ROM lumbar spine, shoulders, hips and knees.  Skin: Intact, no ulcerations or rash noted.  Psych: Good eye contact, normal affect. Memory intact not anxious or depressed appearing.  CNS: CN 2-12 intact, power,  normal throughout.no focal deficits noted.   Assessment & Plan  Type 2 diabetes, diet controlled (Stacy Meyers) Stacy Meyers is reminded of the importance of  commitment to daily physical activity for 30 minutes or more, as able and the need to limit carbohydrate intake to 30 to 60 grams per meal to help with blood sugar control.   Advised to stay off sugar   Stacy Meyers is reminded of the importance of daily foot exam, annual eye examination, and good blood sugar, blood pressure and cholesterol control.  Diabetic Labs Latest Ref Rng & Units 01/13/2018 08/13/2017 03/12/2017 01/07/2017 11/30/2016  HbA1c <5.7 % of total Hgb 6.5(H) - - - -  Microalbumin mg/dL - - - - -  Micro/Creat Ratio <30 mcg/mg creat - - - - -  Chol <200 mg/dL 163 - - 166 -  HDL >50 mg/dL 58 - - 66 -  Calc LDL mg/dL (calc) 83 - - 83 -  Triglycerides <150 mg/dL 122 - - 83 -  Creatinine 0.60 - 0.88 mg/dL 0.92(H) 0.96(H) 0.94(H) 0.89(H) 0.97(H)   BP/Weight 01/17/2018 08/18/2017 08/16/2017 07/14/2017 05/24/2017 09/11/2227 07/14/8919  Systolic BP 194 174 081 448 185 631 497  Diastolic BP 70 72 68 64 64 65 80  Wt. (Lbs) 137 140.6 139.08 143 140.12 136 135  BMI 27.67 28.4 28.09 26.16 28.3 25.7 27.27   Foot/eye exam completion dates Latest Ref Rng & Units 08/16/2017 10/07/2016  Eye Exam No Retinopathy - -  Foot Form Completion - Done Done        Essential hypertension, benign Controlled, no change in medication DASH diet and commitment to daily physical activity for a minimum of 30  minutes discussed and encouraged, as a part of hypertension management. The importance of attaining a healthy weight is also discussed.  BP/Weight 01/18/2018 01/17/2018 08/18/2017 08/16/2017 07/14/2017 1/76/1607 03/11/1060  Systolic BP 694 854 627 035 009 381 829  Diastolic BP 73 70 72 68 64 64 65  Wt. (Lbs) 134.6 137 140.6 139.08 143 140.12 136  BMI 27.19 27.67 28.4 28.09 26.16 28.3 25.7       Hyperlipidemia LDL goal <100 Hyperlipidemia:Low fat diet discussed and encouraged.   Lipid Panel  Lab Results  Component Value Date   CHOL 163 01/13/2018   HDL 58 01/13/2018   LDLCALC 83 01/13/2018   TRIG 122  01/13/2018   CHOLHDL 2.8 01/13/2018   Controlled, no change in medication     Overweight (BMI 25.0-29.9) Improved , she is applauded on this. Patient re-educated about  the importance of commitment to a  minimum of 150 minutes of exercise per week.  The importance of healthy food choices with portion control discussed. Encouraged to start a food diary, count calories and to consider  joining a support group. Sample diet sheets offered. Goals set by the patient for the next several months.   Weight /BMI 01/18/2018 01/17/2018 08/18/2017  WEIGHT 134 lb 9.6 oz 137 lb 140 lb 9.6 oz  HEIGHT 4\' 11"  4\' 11"  4\' 11"   BMI 27.19 kg/m2 27.67 kg/m2 28.4 kg/m2      Spondylosis of lumbar region without myelopathy or radiculopathy Uncontrolled. depo medrol administered IM in the office , to be followed by a short course of oral prednisone    Rheumatoid arthritis with rheumatoid factor of multiple sites without organ or systems involvement (Stacy Meyers) Stable and managed by Rheumatology

## 2018-01-17 NOTE — Assessment & Plan Note (Signed)
Stacy Meyers is reminded of the importance of commitment to daily physical activity for 30 minutes or more, as able and the need to limit carbohydrate intake to 30 to 60 grams per meal to help with blood sugar control.   Advised to stay off sugar   Stacy Meyers is reminded of the importance of daily foot exam, annual eye examination, and good blood sugar, blood pressure and cholesterol control.  Diabetic Labs Latest Ref Rng & Units 01/13/2018 08/13/2017 03/12/2017 01/07/2017 11/30/2016  HbA1c <5.7 % of total Hgb 6.5(H) - - - -  Microalbumin mg/dL - - - - -  Micro/Creat Ratio <30 mcg/mg creat - - - - -  Chol <200 mg/dL 163 - - 166 -  HDL >50 mg/dL 58 - - 66 -  Calc LDL mg/dL (calc) 83 - - 83 -  Triglycerides <150 mg/dL 122 - - 83 -  Creatinine 0.60 - 0.88 mg/dL 0.92(H) 0.96(H) 0.94(H) 0.89(H) 0.97(H)   BP/Weight 01/17/2018 08/18/2017 08/16/2017 07/14/2017 05/24/2017 02/08/5571 02/07/252  Systolic BP 270 623 762 831 517 616 073  Diastolic BP 70 72 68 64 64 65 80  Wt. (Lbs) 137 140.6 139.08 143 140.12 136 135  BMI 27.67 28.4 28.09 26.16 28.3 25.7 27.27   Foot/eye exam completion dates Latest Ref Rng & Units 08/16/2017 10/07/2016  Eye Exam No Retinopathy - -  Foot Form Completion - Done Done

## 2018-01-18 ENCOUNTER — Ambulatory Visit (INDEPENDENT_AMBULATORY_CARE_PROVIDER_SITE_OTHER): Payer: Medicare Other | Admitting: Rheumatology

## 2018-01-18 ENCOUNTER — Encounter: Payer: Self-pay | Admitting: Rheumatology

## 2018-01-18 VITALS — BP 153/73 | HR 59 | Resp 14 | Ht 59.0 in | Wt 134.6 lb

## 2018-01-18 DIAGNOSIS — E119 Type 2 diabetes mellitus without complications: Secondary | ICD-10-CM

## 2018-01-18 DIAGNOSIS — M19012 Primary osteoarthritis, left shoulder: Secondary | ICD-10-CM

## 2018-01-18 DIAGNOSIS — M19041 Primary osteoarthritis, right hand: Secondary | ICD-10-CM

## 2018-01-18 DIAGNOSIS — M170 Bilateral primary osteoarthritis of knee: Secondary | ICD-10-CM | POA: Diagnosis not present

## 2018-01-18 DIAGNOSIS — M19072 Primary osteoarthritis, left ankle and foot: Secondary | ICD-10-CM

## 2018-01-18 DIAGNOSIS — M810 Age-related osteoporosis without current pathological fracture: Secondary | ICD-10-CM | POA: Diagnosis not present

## 2018-01-18 DIAGNOSIS — B356 Tinea cruris: Secondary | ICD-10-CM | POA: Diagnosis not present

## 2018-01-18 DIAGNOSIS — R011 Cardiac murmur, unspecified: Secondary | ICD-10-CM

## 2018-01-18 DIAGNOSIS — M47816 Spondylosis without myelopathy or radiculopathy, lumbar region: Secondary | ICD-10-CM | POA: Diagnosis not present

## 2018-01-18 DIAGNOSIS — M19011 Primary osteoarthritis, right shoulder: Secondary | ICD-10-CM

## 2018-01-18 DIAGNOSIS — M0579 Rheumatoid arthritis with rheumatoid factor of multiple sites without organ or systems involvement: Secondary | ICD-10-CM

## 2018-01-18 DIAGNOSIS — M19071 Primary osteoarthritis, right ankle and foot: Secondary | ICD-10-CM | POA: Diagnosis not present

## 2018-01-18 DIAGNOSIS — I10 Essential (primary) hypertension: Secondary | ICD-10-CM | POA: Diagnosis not present

## 2018-01-18 DIAGNOSIS — Z79899 Other long term (current) drug therapy: Secondary | ICD-10-CM | POA: Diagnosis not present

## 2018-01-18 DIAGNOSIS — M19042 Primary osteoarthritis, left hand: Secondary | ICD-10-CM

## 2018-01-18 LAB — MICROALBUMIN, URINE: Microalb, Ur: 12.4 ug/mL — ABNORMAL HIGH

## 2018-01-18 MED ORDER — METHOTREXATE 2.5 MG PO TABS: ORAL_TABLET | ORAL | 0 refills | Status: DC

## 2018-01-18 NOTE — Patient Instructions (Signed)
Standing Labs We placed an order today for your standing lab work.    Please come back and get your standing labs in April and every 3 months  We have open lab Monday through Friday from 8:30-11:30 AM and 1:30-4:00 PM  at the office of Dr. Shaili Deveshwar.   You may experience shorter wait times on Monday and Friday afternoons. The office is located at [address], [address], [place], [place] [address] No appointment is necessary.   Labs are drawn by [facility].  You may receive a bill from [facility] for your lab work.  If you wish to have your labs drawn at another location, please call the office 24 hours in advance to send orders.  If you have any questions regarding directions or hours of operation,  please call [phone].   Just as a reminder please drink plenty of water prior to coming for your lab work. Thanks!  

## 2018-01-23 ENCOUNTER — Encounter: Payer: Self-pay | Admitting: Family Medicine

## 2018-01-23 NOTE — Assessment & Plan Note (Signed)
Stable and managed by Rheumatology

## 2018-01-23 NOTE — Assessment & Plan Note (Signed)
Uncontrolled. depo medrol administered IM in the office , to be followed by a short course of oral prednisone  

## 2018-01-23 NOTE — Assessment & Plan Note (Signed)
Hyperlipidemia:Low fat diet discussed and encouraged.   Lipid Panel  Lab Results  Component Value Date   CHOL 163 01/13/2018   HDL 58 01/13/2018   LDLCALC 83 01/13/2018   TRIG 122 01/13/2018   CHOLHDL 2.8 01/13/2018   Controlled, no change in medication

## 2018-01-23 NOTE — Assessment & Plan Note (Signed)
Controlled, no change in medication DASH diet and commitment to daily physical activity for a minimum of 30 minutes discussed and encouraged, as a part of hypertension management. The importance of attaining a healthy weight is also discussed.  BP/Weight 01/18/2018 01/17/2018 08/18/2017 08/16/2017 07/14/2017 5/85/2778 02/09/2351  Systolic BP 614 431 540 086 761 950 932  Diastolic BP 73 70 72 68 64 64 65  Wt. (Lbs) 134.6 137 140.6 139.08 143 140.12 136  BMI 27.19 27.67 28.4 28.09 26.16 28.3 25.7

## 2018-01-23 NOTE — Assessment & Plan Note (Signed)
Improved , she is applauded on this. Patient re-educated about  the importance of commitment to a  minimum of 150 minutes of exercise per week.  The importance of healthy food choices with portion control discussed. Encouraged to start a food diary, count calories and to consider  joining a support group. Sample diet sheets offered. Goals set by the patient for the next several months.   Weight /BMI 01/18/2018 01/17/2018 08/18/2017  WEIGHT 134 lb 9.6 oz 137 lb 140 lb 9.6 oz  HEIGHT 4\' 11"  4\' 11"  4\' 11"   BMI 27.19 kg/m2 27.67 kg/m2 28.4 kg/m2

## 2018-01-28 ENCOUNTER — Other Ambulatory Visit: Payer: Self-pay | Admitting: Family Medicine

## 2018-02-03 ENCOUNTER — Encounter: Payer: Self-pay | Admitting: *Deleted

## 2018-02-15 ENCOUNTER — Other Ambulatory Visit: Payer: Self-pay | Admitting: Family Medicine

## 2018-03-08 ENCOUNTER — Other Ambulatory Visit: Payer: Self-pay | Admitting: Family Medicine

## 2018-03-08 DIAGNOSIS — E785 Hyperlipidemia, unspecified: Secondary | ICD-10-CM

## 2018-03-16 ENCOUNTER — Other Ambulatory Visit: Payer: Self-pay | Admitting: Rheumatology

## 2018-03-16 NOTE — Telephone Encounter (Signed)
Last visit: 01/18/18 Next Visit: 06/22/18  Okay to refill per Dr. Estanislado Pandy

## 2018-03-18 ENCOUNTER — Other Ambulatory Visit: Payer: Self-pay | Admitting: Family Medicine

## 2018-03-18 DIAGNOSIS — E1169 Type 2 diabetes mellitus with other specified complication: Secondary | ICD-10-CM

## 2018-03-18 DIAGNOSIS — R7989 Other specified abnormal findings of blood chemistry: Secondary | ICD-10-CM

## 2018-03-18 DIAGNOSIS — I10 Essential (primary) hypertension: Secondary | ICD-10-CM

## 2018-03-18 DIAGNOSIS — E785 Hyperlipidemia, unspecified: Secondary | ICD-10-CM

## 2018-03-18 DIAGNOSIS — E669 Obesity, unspecified: Principal | ICD-10-CM

## 2018-03-18 DIAGNOSIS — M0579 Rheumatoid arthritis with rheumatoid factor of multiple sites without organ or systems involvement: Secondary | ICD-10-CM

## 2018-03-18 DIAGNOSIS — E559 Vitamin D deficiency, unspecified: Secondary | ICD-10-CM

## 2018-04-06 ENCOUNTER — Other Ambulatory Visit: Payer: Self-pay | Admitting: Rheumatology

## 2018-04-06 NOTE — Telephone Encounter (Signed)
Last Visit: 01/18/2018 Next Visit: 06/22/2018 Labs: 01/13/2018 elevated glucose, Creat 0.92 prev 0.96, calcium 11.1  Okay to refill per Dr. Estanislado Pandy.

## 2018-04-20 ENCOUNTER — Encounter: Payer: Self-pay | Admitting: *Deleted

## 2018-05-12 ENCOUNTER — Other Ambulatory Visit (HOSPITAL_COMMUNITY): Payer: Self-pay | Admitting: Family Medicine

## 2018-05-12 DIAGNOSIS — Z803 Family history of malignant neoplasm of breast: Secondary | ICD-10-CM

## 2018-05-12 DIAGNOSIS — R921 Mammographic calcification found on diagnostic imaging of breast: Secondary | ICD-10-CM

## 2018-05-27 ENCOUNTER — Encounter: Payer: Self-pay | Admitting: Family Medicine

## 2018-05-27 ENCOUNTER — Other Ambulatory Visit: Payer: Self-pay | Admitting: Physician Assistant

## 2018-05-27 ENCOUNTER — Ambulatory Visit (INDEPENDENT_AMBULATORY_CARE_PROVIDER_SITE_OTHER): Payer: Medicare Other | Admitting: Family Medicine

## 2018-05-27 ENCOUNTER — Ambulatory Visit: Payer: Medicare Other

## 2018-05-27 VITALS — BP 153/73 | Ht 59.0 in | Wt 134.0 lb

## 2018-05-27 DIAGNOSIS — Z0000 Encounter for general adult medical examination without abnormal findings: Secondary | ICD-10-CM | POA: Diagnosis not present

## 2018-05-27 NOTE — Telephone Encounter (Signed)
Last Visit: 01/18/2018 Next Visit: 06/22/2018  Okay to refill per Dr. Estanislado Pandy

## 2018-05-27 NOTE — Patient Instructions (Signed)
[name] , Thank you for taking time to come for your Medicare Wellness Visit. I appreciate your ongoing commitment to your health goals. Please review the following plan we discussed and let me know if I can assist you in the future.   Screening recommendations/referrals: Colonoscopy: up to date-age out unless needed Mammogram: Due 2020 Bone Density: Up to date, unless recheck needed  Recommended yearly ophthalmology/optometry visit for glaucoma screening and checkup Recommended yearly dental visit for hygiene and checkup  Vaccinations: Influenza vaccine: Due Fall 2020 Pneumococcal vaccine: Completed  Tdap vaccine: Up to date until 2022 Shingles vaccine: Cannot take due to a medication you are on, as per what you were told   Advanced directives: Discuss in the office   Conditions/risks identified: Reviewed, please read the preventive information below that can help with falls and other preventive issues.  Next appointment: 10/17/2018  Preventive Care 65 Years and Older, Female Preventive care refers to lifestyle choices and visits with your health care provider that can promote health and wellness. What does preventive care include?  A yearly physical exam. This is also called an annual well check.  Dental exams once or twice a year.  Routine eye exams. Ask your health care provider how often you should have your eyes checked.  Personal lifestyle choices, including:  Daily care of your teeth and gums.  Regular physical activity.  Eating a healthy diet.  Avoiding tobacco and drug use.  Limiting alcohol use.  Practicing safe sex.  Taking low-dose aspirin every day.  Taking vitamin and mineral supplements as recommended by your health care provider. What happens during an annual well check? The services and screenings done by your health care provider during your annual well check will depend on your age, overall health, lifestyle risk factors, and family history of  disease. Counseling  Your health care provider may ask you questions about your:  Alcohol use.  Tobacco use.  Drug use.  Emotional well-being.  Home and relationship well-being.  Sexual activity.  Eating habits.  History of falls.  Memory and ability to understand (cognition).  Work and work Statistician.  Reproductive health. Screening  You may have the following tests or measurements:  Height, weight, and BMI.  Blood pressure.  Lipid and cholesterol levels. These may be checked every 5 years, or more frequently if you are over 56 years old.  Skin check.  Lung cancer screening. You may have this screening every year starting at age 87 if you have a 30-pack-year history of smoking and currently smoke or have quit within the past 15 years.  Fecal occult blood test (FOBT) of the stool. You may have this test every year starting at age 52.  Flexible sigmoidoscopy or colonoscopy. You may have a sigmoidoscopy every 5 years or a colonoscopy every 10 years starting at age 71.  Hepatitis C blood test.  Hepatitis B blood test.  Sexually transmitted disease (STD) testing.  Diabetes screening. This is done by checking your blood sugar (glucose) after you have not eaten for a while (fasting). You may have this done every 1-3 years.  Bone density scan. This is done to screen for osteoporosis. You may have this done starting at age 6.  Mammogram. This may be done every 1-2 years. Talk to your health care provider about how often you should have regular mammograms. Talk with your health care provider about your test results, treatment options, and if necessary, the need for more tests. Vaccines  Your health care  provider may recommend certain vaccines, such as:  Influenza vaccine. This is recommended every year.  Tetanus, diphtheria, and acellular pertussis (Tdap, Td) vaccine. You may need a Td booster every 10 years.  Zoster vaccine. You may need this after age 60.   Pneumococcal 13-valent conjugate (PCV13) vaccine. One dose is recommended after age 43.  Pneumococcal polysaccharide (PPSV23) vaccine. One dose is recommended after age 9. Talk to your health care provider about which screenings and vaccines you need and how often you need them. This information is not intended to replace advice given to you by your health care provider. Make sure you discuss any questions you have with your health care provider. Document Released: 01/18/2015 Document Revised: 09/11/2015 Document Reviewed: 10/23/2014 Elsevier Interactive Patient Education  2017 [employer] Prevention in the Home Falls can cause injuries. They can happen to people of all ages. There are many things you can do to make your home safe and to help prevent falls. What can I do on the outside of my home?  Regularly fix the edges of walkways and driveways and fix any cracks.  Remove anything that might make you trip as you walk through a door, such as a raised step or threshold.  Trim any bushes or trees on the path to your home.  Use bright outdoor lighting.  Clear any walking paths of anything that might make someone trip, such as rocks or tools.  Regularly check to see if handrails are loose or broken. Make sure that both sides of any steps have handrails.  Any raised decks and porches should have guardrails on the edges.  Have any leaves, snow, or ice cleared regularly.  Use sand or salt on walking paths during winter.  Clean up any spills in your garage right away. This includes oil or grease spills. What can I do in the bathroom?  Use night lights.  Install grab bars by the toilet and in the tub and shower. Do not use towel bars as grab bars.  Use non-skid mats or decals in the tub or shower.  If you need to sit down in the shower, use a plastic, non-slip stool.  Keep the floor dry. Clean up any water that spills on the floor as soon as it happens.  Remove soap  buildup in the tub or shower regularly.  Attach bath mats securely with double-sided non-slip rug tape.  Do not have throw rugs and other things on the floor that can make you trip. What can I do in the bedroom?  Use night lights.  Make sure that you have a light by your bed that is easy to reach.  Do not use any sheets or blankets that are too big for your bed. They should not hang down onto the floor.  Have a firm chair that has side arms. You can use this for support while you get dressed.  Do not have throw rugs and other things on the floor that can make you trip. What can I do in the kitchen?  Clean up any spills right away.  Avoid walking on wet floors.  Keep items that you use a lot in easy-to-reach places.  If you need to reach something above you, use a strong step stool that has a grab bar.  Keep electrical cords out of the way.  Do not use floor polish or wax that makes floors slippery. If you must use wax, use non-skid floor wax.  Do not have throw  rugs and other things on the floor that can make you trip. What can I do with my stairs?  Do not leave any items on the stairs.  Make sure that there are handrails on both sides of the stairs and use them. Fix handrails that are broken or loose. Make sure that handrails are as long as the stairways.  Check any carpeting to make sure that it is firmly attached to the stairs. Fix any carpet that is loose or worn.  Avoid having throw rugs at the top or bottom of the stairs. If you do have throw rugs, attach them to the floor with carpet tape.  Make sure that you have a light switch at the top of the stairs and the bottom of the stairs. If you do not have them, ask someone to add them for you. What else can I do to help prevent falls?  Wear shoes that:  Do not have high heels.  Have rubber bottoms.  Are comfortable and fit you well.  Are closed at the toe. Do not wear sandals.  If you use a stepladder:  Make  sure that it is fully opened. Do not climb a closed stepladder.  Make sure that both sides of the stepladder are locked into place.  Ask someone to hold it for you, if possible.  Clearly mark and make sure that you can see:  Any grab bars or handrails.  First and last steps.  Where the edge of each step is.  Use tools that help you move around (mobility aids) if they are needed. These include:  Canes.  Walkers.  Scooters.  Crutches.  Turn on the lights when you go into a dark area. Replace any light bulbs as soon as they burn out.  Set up your furniture so you have a clear path. Avoid moving your furniture around.  If any of your floors are uneven, fix them.  If there are any pets around you, be aware of where they are.  Review your medicines with your doctor. Some medicines can make you feel dizzy. This can increase your chance of falling. Ask your doctor what other things that you can do to help prevent falls. This information is not intended to replace advice given to you by your health care provider. Make sure you discuss any questions you have with your health care provider. Document Released: 10/18/2008 Document Revised: 05/30/2015 Document Reviewed: 01/26/2014 Elsevier Interactive Patient Education  2017 [employer].

## 2018-05-27 NOTE — Progress Notes (Signed)
Subjective:   Stacy Meyers is a 85 y.o. female who presents for Medicare Annual (Subsequent) preventive examination.  Location of Patient: Home Location of Provider: Telehealth Consent was obtain for visit to be over via telehealth. I verified that I am speaking with the correct person using two identifiers.   Review of Systems:   Cardiac Risk Factors include: advanced age (>16men, >59 women);hypertension;dyslipidemia     Objective:     Vitals: BP (!) 153/73   Ht 4\' 11"  (1.499 m)   Wt 134 lb (60.8 kg)   BMI 27.06 kg/m   Body mass index is 27.06 kg/m.  Advanced Directives 05/24/2017 05/06/2016 06/02/2012 06/01/2012 05/22/2011 04/16/2011 12/17/2010  Does Patient Have a Medical Advance Directive? Yes Yes Patient does not have advance directive;Patient would not like information Patient does not have advance directive Patient does not have advance directive;Patient would like information Patient does not have advance directive;Patient would not like information -  Type of Advance Directive Living will [place];Living will - - - - -  Does patient want to make changes to medical advance directive? No - Patient declined No - Patient declined - - - - -  Copy of Press photographer in Chart? - No - copy requested - - - - -  Would patient like information on creating a medical advance directive? - - - - Advance directive packet given - -  Pre-existing out of facility DNR order (yellow form or pink MOST form) - - No No No No No    Tobacco Social History   Tobacco Use  Smoking Status Never Smoker  Smokeless Tobacco Never Used  Tobacco Comment   Second hand smoke      Counseling given: Not Answered Comment: Second hand smoke    Clinical Intake:     Pain Score: 0-No pain                 Past Medical History:  Diagnosis Date  . Acute cholecystitis 12/16/2010  . Anemia 12/14/2010  . ARF (acute renal failure) ([place]) 12/14/2010  . Bradycardia     Sinus - noted 6/11  . Cholelithiasis 12/14/2010  . Collagen vascular disease ([place])   . Diastolic dysfunction 18/29/9371   Grade 1 per 2-D echocardiogram  . Dysrhythmia   . Essential hypertension, benign   . HTN (hypertension) 12/14/2010  . Hypercalcemia 12/14/2010  . Hypokalemia 12/14/2010  . Mixed hyperlipidemia   . Osteoarthritis   . Rheumatoid arthritis(714.0)    Past Surgical History:  Procedure Laterality Date  . Bilateral cataract surgery  2009  . BUNIONECTOMY Right 2012  . CHOLECYSTECTOMY  12/17/2010   Procedure: LAPAROSCOPIC CHOLECYSTECTOMY;  Surgeon: Donato Heinz;  Location: [facility];  Service: General;  Laterality: N/A;  . COLONOSCOPY  05/22/2011   Procedure: COLONOSCOPY;  Surgeon: Rogene Houston, MD;  Location: [facility];  Service: Endoscopy;  Laterality: N/A;  1045  . EYE SURGERY Bilateral    cataract  . GANGLION CYST EXCISION Left 06/02/2012   Procedure: Excision ganglion left dorsal wrist;  Surgeon: Sanjuana Kava, MD;  Location: [facility];  Service: Orthopedics;  Laterality: Left;  . L2-L3 discectomy  2002  . L3-L4, L4-L5 lumbar decompression  2011  . Right shoulder cyst excision  2010  . SPINE SURGERY     twice, approx 2006, 2011   Family History  Problem Relation Age of Onset  . Stroke Mother   . Cancer Father   . Diabetes Sister   .  Diabetes Sister   . Kidney failure Brother   . Kidney failure Brother   . Pancreatic cancer Sister   . Kidney Stones Sister        Only one Kidney  . Arthritis Sister   . Heart disease Daughter    Social History   Socioeconomic History  . Marital status: Widowed    Spouse name: Not on file  . Number of children: 1  . Years of education: Not on file  . Highest education level: Not on file  Occupational History  . Occupation: Retired    Comment: Administrator  . Financial resource strain: Not very hard  . Food insecurity:    Worry: Never true    Inability: Never true  . Transportation needs:     Medical: No    Non-medical: No  Tobacco Use  . Smoking status: Never Smoker  . Smokeless tobacco: Never Used  . Tobacco comment: Second hand smoke   Substance and Sexual Activity  . Alcohol use: No  . Drug use: Never  . Sexual activity: Not Currently  Lifestyle  . Physical activity:    Days per week: 7 days    Minutes per session: 40 min  . Stress: Not at all  Relationships  . Social connections:    Talks on phone: More than three times a week    Gets together: More than three times a week    Attends religious service: More than 4 times per year    Active member of club or organization: Yes    Attends meetings of clubs or organizations: 1 to 4 times per year    Relationship status: Widowed  Other Topics Concern  . Not on file  Social History Narrative   Husband passed away 04-18-16. It has been financially harder to pay the bills since his passing, but she is making it. Has grandchildren around which makes it easier.     Outpatient Encounter Medications as of 05/27/2018  Medication Sig  . acetaminophen (TYLENOL) 500 MG tablet Take 1,000 mg by mouth every 6 (six) hours as needed. Reported on 05/28/2015  . acyclovir (ZOVIRAX) 400 MG tablet TAKE 1 TABLET BY MOUTH TWICE A DAY  . alendronate (FOSAMAX) 70 MG tablet TAKE 1 TABLET BY MOUTH EVERY 7 DAYS. TAKE WITH A FULL GLASS OF WATER ON AN EMPTY STOMACH.  Marland Kitchen amLODipine (NORVASC) 5 MG tablet TAKE 1 TABLET BY MOUTH EVERY DAY  . azelastine (ASTELIN) 0.1 % nasal spray Place 2 sprays into both nostrils 2 (two) times daily. Use in each nostril as directed (Patient taking differently: Place 2 sprays into both nostrils as needed. Use in each nostril as directed)  . KLOR-CON M20 20 MEQ tablet TAKE 1 TABLET (20 MEQ TOTAL) BY MOUTH DAILY.  . methotrexate (RHEUMATREX) 2.5 MG tablet TAKE 3 TABLETS BY MOUTH ONCE A WEEK. CAUTION:CHEMOTHERAPY. PROTECT FROM LIGHT.  Marland Kitchen predniSONE (DELTASONE) 1 MG tablet TAKE 4 TABLETS (4 MG TOTAL) BY MOUTH DAILY WITH  BREAKFAST.  . rosuvastatin (CRESTOR) 5 MG tablet TAKE 1 TABLET BY MOUTH EVERY OTHER DAY  . tiZANidine (ZANAFLEX) 2 MG tablet Take one tablet at bedtime for 5 days, then as needed  . UNABLE TO FIND Diabetic shoes x 1 Inserts x 3  DX E11.9  . UNABLE TO FIND Diabetic shoes x 1 inserts x 3  Dx e11.9  . Vitamin D, Ergocalciferol, (DRISDOL) 1.25 MG (50000 UT) CAPS capsule TAKE 1 CAPSULE BY MOUTH ONCE A WEEK  . [  DISCONTINUED] folic acid (FOLVITE) 1 MG tablet Take 1 tablet (1 mg total) by mouth daily.  . [DISCONTINUED] UNABLE TO FIND Diabetic shoes   No facility-administered encounter medications on file as of 05/27/2018.     Activities of Daily Living In your present state of health, do you have any difficulty performing the following activities: 05/27/2018  Hearing? N  Vision? Y  Difficulty concentrating or making decisions? N  Walking or climbing stairs? N  Dressing or bathing? N  Doing errands, shopping? N  Comment has to have assistance if going out of town   Conservation officer, nature and eating ? N  Using the Toilet? N  In the past six months, have you accidently leaked urine? N  Do you have problems with loss of bowel control? N  Managing your Medications? N  Managing your Finances? N  Housekeeping or managing your Housekeeping? N  Some recent data might be hidden    Patient Care Team: Fayrene Helper, MD as PCP - General Branch, Alphonse Guild, MD as PCP - Cardiology (Cardiology) Estanislado Emms, MD as Consulting Physician (Nephrology) Bo Merino, MD as Consulting Physician (Rheumatology)    Assessment:   This is a routine wellness examination for Kelyse.  Exercise Activities and Dietary recommendations Current Exercise Habits: Home exercise routine, Type of exercise: walking, Time (Minutes): 35, Frequency (Times/Week): 7, Weekly Exercise (Minutes/Week): 245, Intensity: Mild  Goals    . DIET - INCREASE WATER INTAKE     8 glasses per day from 5    . Exercise 3x per week (30  min per time)     Recommend starting a routine exercise program at least 3 days a week for 30-45 minutes at a time as tolerated.      . Prevent falls       Fall Risk Fall Risk  05/27/2018 01/17/2018 08/16/2017 05/24/2017 01/13/2017  Falls in the past year? 0 0 No No Yes  Comment - - - - -  Number falls in past yr: 0 - - - 1  Injury with Fall? 0 - - - -  Risk for fall due to : - - - - -  Follow up - - - - -  Comment - - - - -   Is the patient's home free of loose throw rugs in walkways, pet beds, electrical cords, etc?   yes      Grab bars in the bathroom? yes      Handrails on the stairs?   Yes      Adequate lighting?   yes  Timed Get Up and Go performed: n/a telemedicine visit  Depression Screen PHQ 2/9 Scores 05/27/2018 01/17/2018 08/16/2017 05/24/2017  PHQ - 2 Score 0 2 0 1  PHQ- 9 Score - 4 - -     Cognitive Function     6CIT Screen 05/27/2018 05/24/2017 05/06/2016  What Year? 0 points 0 points 0 points  What month? 0 points 0 points 0 points  What time? 0 points 0 points 0 points  Count back from 20 0 points 0 points 0 points  Months in reverse 2 points 0 points 0 points  Repeat phrase 0 points 0 points 0 points  Total Score 2 0 0    Immunization History  Administered Date(s) Administered  . Influenza Split 11/07/2013  . Influenza Whole 10/28/2009, 09/17/2010  . Influenza, High Dose Seasonal PF 09/29/2016  . Influenza,inj,Quad PF,6+ Mos 09/03/2014  . Influenza-Unspecified 10/05/2013, 09/29/2016  . PPD Test 05/14/2014, 05/28/2015  .  Pneumococcal Conjugate-13 08/03/2013  . Pneumococcal Polysaccharide-23 08/28/2009  . Tdap 04/29/2010    Qualifies for Shingles Vaccine? Yes, she was told she couldn't take it due to a medicine she was on.  Screening Tests Health Maintenance  Topic Date Due  . HEMOGLOBIN A1C  07/14/2018  . INFLUENZA VACCINE  08/06/2018  . FOOT EXAM  08/20/2018  . OPHTHALMOLOGY EXAM  11/30/2018  . URINE MICROALBUMIN  01/18/2019  . TETANUS/TDAP   04/28/2020  . DEXA SCAN  Completed  . PNA vac Low Risk Adult  Completed    Cancer Screenings: Lung: Low Dose CT Chest recommended if Age 15-80 years, 30 pack-year currently smoking OR have quit w/in 15years. Patient does not qualify. Breast:  Up to date on Mammogram? Yes   Up to date of Bone Density/Dexa? Yes Colorectal: up to date  Additional Screenings:: Hepatitis C Screening: Add to next set of labs      Plan:     1. Encounter for Medicare annual wellness exam I have personally reviewed and noted the following in the patient's chart:   . Medical and social history . Use of alcohol, tobacco or illicit drugs  . Current medications and supplements . Functional ability and status . Nutritional status . Physical activity . Advanced directives . List of other physicians . Hospitalizations, surgeries, and ER visits in previous 12 months . Vitals . Screenings to include cognitive, depression, and falls . Referrals and appointments  In addition, I have reviewed and discussed with patient certain preventive protocols, quality metrics, and best practice recommendations. A written personalized care plan for preventive services as well as general preventive health recommendations were provided to patient.     I provided 20 minutes of non-face-to-face time during this encounter.   Perlie Mayo, DNP, AGNP-BC [place], [address] [place], [place] [address] Office Hours: Mon-Thurs 8 am-5 pm; Fri 8 am-12 pm Office Phone:  [phone]  Office Fax: [phone]    05/27/2018

## 2018-06-06 ENCOUNTER — Ambulatory Visit: Payer: Medicare Other

## 2018-06-08 NOTE — Progress Notes (Signed)
Office Visit Note  Patient: Stacy Meyers             Date of Birth: [birth year]           MRN: [id number]             PCP: Stacy Meyers Referring: Stacy Meyers Visit Date: 06/22/2018 Occupation: @GUAROCC @  Subjective:  Medication monitoring    History of Present Illness: Stacy Meyers is a 85 y.o. female with history of seropositive rheumatoid arthritis and osteoarthritis.  She is taking MTX 3 tablets po once weekly and folic acid 1 mg po daily.  She continues to take prednisone 4 mg po daily.  She states a few days ago she was overusing her hands and noticed some inflammation in both wrist joints, but she states the inflammation and tenderness have resolved. She is having right knee joint pain and states it swells occasionally.  She continues to walk with a cane.  She uses Aspercreme topically prn.   Activities of Daily Living:  Patient reports morning stiffness for 10 minutes.   Patient Reports nocturnal pain.  Difficulty dressing/grooming: Denies Difficulty climbing stairs: Denies Difficulty getting out of chair: Denies Difficulty using hands for taps, buttons, cutlery, and/or writing: Denies  Review of Systems  Constitutional: Positive for fatigue.  HENT: Positive for mouth dryness. Negative for mouth sores and nose dryness.   Eyes: Positive for dryness. Negative for pain and visual disturbance.  Respiratory: Negative for cough, hemoptysis, shortness of breath and difficulty breathing.   Cardiovascular: Positive for swelling in legs/feet. Negative for chest pain, palpitations and hypertension.  Gastrointestinal: Positive for constipation and diarrhea. Negative for blood in stool.  Genitourinary: Negative for painful urination.  Musculoskeletal: Positive for arthralgias, joint pain and morning stiffness. Negative for joint swelling, myalgias, muscle weakness, muscle tenderness and myalgias.  Skin: Negative for color change, pallor, rash, hair loss,  nodules/bumps, skin tightness, ulcers and sensitivity to sunlight.  Allergic/Immunologic: Negative for susceptible to infections.  Neurological: Negative for dizziness, numbness, headaches and weakness.  Hematological: Negative for bruising/bleeding tendency and swollen glands.  Psychiatric/Behavioral: Negative for depressed mood and sleep disturbance. The patient is not nervous/anxious.     PMFS History:  Patient Active Problem List   Diagnosis Date Noted   Fibroids 05/08/2016   Spondylosis of lumbar region without myelopathy or radiculopathy 12/24/2015   Solitary bone cyst of right shoulder 05/28/2015   Right knee pain 05/28/2015   Osteoarthritis of both shoulders 05/28/2015   Overweight (BMI 25.0-29.9) 04/28/2014   Ganglion cyst of wrist 71/24/5809   Systolic murmur 98/33/8250   Diastolic dysfunction 53/97/6734   Seasonal allergies 08/28/2010   Type 2 diabetes, diet controlled ([place]) 07/25/2009   Herpes simplex virus (HSV) infection 09/16/2008   Rheumatoid arthritis with rheumatoid factor of multiple sites without organ or systems involvement ([place]) 09/16/2008   Hyperlipidemia LDL goal <100 05/23/2008   Essential hypertension, benign 05/23/2008    Past Medical History:  Diagnosis Date   Acute cholecystitis 12/16/2010   Anemia 12/14/2010   ARF (acute renal failure) ([place]) 12/14/2010   Bradycardia    Sinus - noted 6/11   Cholelithiasis 12/14/2010   Collagen vascular disease ([place])    Diastolic dysfunction 19/37/9024   Grade 1 per 2-D echocardiogram   Dysrhythmia    Essential hypertension, benign    HTN (hypertension) 12/14/2010   Hypercalcemia 12/14/2010   Hypokalemia 12/14/2010   Mixed hyperlipidemia    Osteoarthritis    Rheumatoid arthritis(714.0)  Family History  Problem Relation Age of Onset   Stroke Mother    Cancer Father    Diabetes Sister    Diabetes Sister    Kidney failure Brother    Kidney failure Brother    Pancreatic  cancer Sister    Kidney Stones Sister        Only one Kidney   Arthritis Sister    Heart disease Daughter    Past Surgical History:  Procedure Laterality Date   Bilateral cataract surgery  2009   BUNIONECTOMY Right 2012   CHOLECYSTECTOMY  12/17/2010   Procedure: LAPAROSCOPIC CHOLECYSTECTOMY;  Surgeon: Donato Heinz;  Location: [facility];  Service: General;  Laterality: N/A;   COLONOSCOPY  05/22/2011   Procedure: COLONOSCOPY;  Surgeon: Rogene Houston, Meyers;  Location: [facility];  Service: Endoscopy;  Laterality: N/A;  1045   EYE SURGERY Bilateral    cataract   GANGLION CYST EXCISION Left 06/02/2012   Procedure: Excision ganglion left dorsal wrist;  Surgeon: Sanjuana Kava, Meyers;  Location: [facility];  Service: Orthopedics;  Laterality: Left;   L2-L3 discectomy  2002   L3-L4, L4-L5 lumbar decompression  2011   Right shoulder cyst excision  2010   SPINE SURGERY     twice, approx 2006, 2011   Social History   Social History Narrative   Husband passed away 04-23-16. It has been financially harder to pay the bills since his passing, but she is making it. Has grandchildren around which makes it easier.    Immunization History  Administered Date(s) Administered   Influenza Split 11/07/2013   Influenza Whole 10/28/2009, 09/17/2010   Influenza, High Dose Seasonal PF 09/29/2016   Influenza,inj,Quad PF,6+ Mos 09/03/2014   Influenza-Unspecified 10/05/2013, 09/29/2016   PPD Test 05/14/2014, 05/28/2015   Pneumococcal Conjugate-13 08/03/2013   Pneumococcal Polysaccharide-23 08/28/2009   Tdap 04/29/2010     Objective: Vital Signs: BP (!) 168/70 (BP Location: Left Arm, Patient Position: Sitting, Cuff Size: Normal)    Pulse (!) 55    Resp 18    Ht 4\' 11"  (1.499 m)    Wt 141 lb 12.8 oz (64.3 kg)    BMI 28.64 kg/m    Physical Exam Vitals signs and nursing note reviewed.  Constitutional:      Appearance: She is well-developed.  HENT:     Head: Normocephalic and  atraumatic.  Eyes:     Conjunctiva/sclera: Conjunctivae normal.  Neck:     Musculoskeletal: Normal range of motion.  Cardiovascular:     Rate and Rhythm: Normal rate and regular rhythm.     Heart sounds: Normal heart sounds.  Pulmonary:     Effort: Pulmonary effort is normal.     Breath sounds: Normal breath sounds.  Abdominal:     General: Bowel sounds are normal.     Palpations: Abdomen is soft.  Lymphadenopathy:     Cervical: No cervical adenopathy.  Skin:    General: Skin is warm and dry.     Capillary Refill: Capillary refill takes less than 2 seconds.  Neurological:     Mental Status: She is alert and oriented to person, place, and time.  Psychiatric:        Behavior: Behavior normal.      Musculoskeletal Exam: Shoulder joint forward flexion to 30 degrees bilaterally.  Elbow joints good ROM with no tenderness or swelling. Ulnar deviation noted bilaterally.  Synovial thickening of all MCP, PIPs, and DIPs.  Mild extensor tenosynovitis of left wrist.  Synovial thickening over ulnar styloid bilaterally. Left knee good ROM with no warmth or effusion.  Right knee has slightly limited extension.  Valgus deformity of right knee.  Warmth of right knee but no effusion.   CDAI Exam: CDAI Score: 2.4  Patient Global: 2 mm; Provider Global: 2 mm Swollen: 1 ; Tender: 1  Joint Exam      Right  Left  Wrist     Swollen Tender     Investigation: No additional findings.  Imaging: Mm Diag Breast Tomo Bilateral  Result Date: 06/14/2018 CLINICAL DATA:  85 year old female presenting for annual bilateral mammogram in 1 year follow-up of probably benign right breast calcifications. EXAM: DIGITAL DIAGNOSTIC BILATERAL MAMMOGRAM WITH CAD AND TOMO COMPARISON:  Previous exam(s). ACR Breast Density Category b: There are scattered areas of fibroglandular density. FINDINGS: A small group of punctate calcifications are again identified in the upper outer right breast at posterior depth. These are not  significantly changed from prior studies. Benign round and punctate calcifications are again seen scattered throughout the bilateral breasts. No new or suspicious findings are identified. Mammographic images were processed with CAD. IMPRESSION: 1. Stable, probably benign right breast calcifications. Recommendation is for continued mammographic follow-up in 1 year to coincide with the patient's annual bilateral mammogram. 2. No mammographic evidence of malignancy on the left. RECOMMENDATION: Bilateral diagnostic mammogram in 1 year. I have discussed the findings and recommendations with the patient. Results were also provided in writing at the conclusion of the visit. If applicable, a reminder letter will be sent to the patient regarding the next appointment. BI-RADS CATEGORY  3: Probably benign. Electronically Signed   By: Kristopher Oppenheim M.D.   On: 06/14/2018 09:29    Recent Labs: Lab Results  Component Value Date   WBC 5.5 01/13/2018   HGB 13.5 01/13/2018   PLT 291 01/13/2018   NA 143 01/13/2018   K 4.1 01/13/2018   CL 103 01/13/2018   CO2 30 01/13/2018   GLUCOSE 107 (H) 01/13/2018   BUN 18 01/13/2018   CREATININE 0.92 (H) 01/13/2018   BILITOT 0.3 01/13/2018   ALKPHOS 57 07/14/2016   AST 13 01/13/2018   ALT 8 01/13/2018   PROT 6.9 01/13/2018   ALBUMIN 4.1 07/14/2016   CALCIUM 11.1 (H) 01/13/2018   GFRAA 67 01/13/2018    Speciality Comments: No specialty comments available.  Procedures:  No procedures performed Allergies: Aspirin and Sudafed [pseudoephedrine hcl]   Assessment / Plan:     Visit Diagnoses: Rheumatoid arthritis with rheumatoid factor of multiple sites without organ or systems involvement ([place]) - She has left wrist extensor tenosynovitis on exam.  Ulnar deviation noted bilaterally.  She has synovial thickening over bilateral ulnar styloids, MCPs, PIPs, and DIPs.  Valgus deformity and warmth of right knee joint noted.  She is clinically stable on methotrexate 3 tablets po  once weekly and folic acid 1 mg po daily.  She continues to take prednisone 4 mg po daily.  She does not need any refills at this time.  She will continue on this current treatment regimen. She was advised to notify us if she develops increased joint pain or joint swelling.  She will follow up in 5 months.   High risk medication use - Methotrexate 3 tablets every 7 days, folic acid 1 mg daily, and prednisone 1 mg 4 tablets daily.  Most recent CBC/CMP within normal limits except for mildly elevated creatinine and decreased GFR on 01/13/2018.  She is overdue for labs.  CBC/CMP ordered for today and will monitor every 3 months.  Standing orders are in place.  She received Prevnar 13 and Pneumovax 23 vaccines. Plan: CBC with Differential/Platelet, COMPLETE METABOLIC PANEL WITH GFR  Primary osteoarthritis of both hands -PIP and DIP synovial thickening consistent with osteoarthritis of both hands. Joint protection and muscle strengthening were discussed.  Primary osteoarthritis of both shoulders -Forward flexion 30 degrees bilaterally.  She has painful and limited ROM of both shoulder joints.    Primary osteoarthritis of both knees -She has valgus deformity of the right knee joint.  She has pain with extension and flexion.  Warmth of the right knee joint noted but no effusion.  Left knee has good ROM with no warmth or effusion.  She walks with a cane.  She was encouraged to use Aspercreme topically and she can use an ACE wrap for compression and support.  Primary osteoarthritis of both feet - She has no feet pain or joint swelling.  Pedal edema noted.   Spondylosis of lumbar region without myelopathy or radiculopathy - She has no midline spinal tenderness.  She has no symptoms of radiculopathy at this time.   Age-related osteoporosis without current pathological fracture - She is on Fosamax 70 mg every 7 days (started in 2014) managed by Dr. Moshe Cipro. DEXA October 2014 showed radius T-score of -3.1. Due for  repeat DEXA. Last vitamin D level was 54 on 01/13/2018. Time for drug holiday and reassess therapy from repeat DEXA?  Patient's calcium was elevated.  Have advised her not to add any calcium supplement.  Her vitamin D was in desirable range.  Other medical conditions are listed as follows:   Systolic murmur  Essential hypertension   Tinea cruris  Type 2 diabetes, diet controlled ([place])   Orders: Orders Placed This Encounter  Procedures   CBC with Differential/Platelet   COMPLETE METABOLIC PANEL WITH GFR   No orders of the defined types were placed in this encounter.     Follow-Up Instructions: Return in about 5 months (around 11/22/2018) for Rheumatoid arthritis.  Hazel Sams PA-C   I examined and evaluated the patient with Hazel Sams PA.  Patient had mild extensor tenosynovitis in her left wrist on my examination, which she relates to doing some extra activity at home.  None of the other joints are swollen.  She has bilateral ulnar deviation.  She is satisfied with her treatment and does not want to change anything.  The plan of care was discussed as noted above.  Bo Merino, Meyers  Note - This record has been created using Editor, commissioning.  Chart creation errors have been sought, but may not always  have been located. Such creation errors do not reflect on  the standard of medical care.

## 2018-06-12 ENCOUNTER — Other Ambulatory Visit: Payer: Self-pay | Admitting: Rheumatology

## 2018-06-13 NOTE — Telephone Encounter (Signed)
Last Visit:01/18/2018 Next Visit:06/22/2018  Okay to refillper Dr. Estanislado Pandy

## 2018-06-14 ENCOUNTER — Ambulatory Visit (HOSPITAL_COMMUNITY)
Admission: RE | Admit: 2018-06-14 | Discharge: 2018-06-14 | Disposition: A | Discharge status: Home / Self Care | Payer: Medicare Other | Source: Ambulatory Visit | Attending: Family Medicine | Admitting: Family Medicine

## 2018-06-14 ENCOUNTER — Ambulatory Visit (HOSPITAL_COMMUNITY): Admission: RE | Admit: 2018-06-14 | Payer: Medicare Other | Source: Ambulatory Visit

## 2018-06-14 ENCOUNTER — Other Ambulatory Visit: Payer: Self-pay

## 2018-06-14 DIAGNOSIS — Z803 Family history of malignant neoplasm of breast: Secondary | ICD-10-CM | POA: Diagnosis not present

## 2018-06-14 DIAGNOSIS — R921 Mammographic calcification found on diagnostic imaging of breast: Secondary | ICD-10-CM | POA: Diagnosis not present

## 2018-06-20 ENCOUNTER — Telehealth: Payer: Self-pay | Admitting: Rheumatology

## 2018-06-20 NOTE — Telephone Encounter (Signed)
Opened in error

## 2018-06-22 ENCOUNTER — Ambulatory Visit (INDEPENDENT_AMBULATORY_CARE_PROVIDER_SITE_OTHER): Payer: Medicare Other | Admitting: Rheumatology

## 2018-06-22 ENCOUNTER — Encounter: Payer: Self-pay | Admitting: Rheumatology

## 2018-06-22 ENCOUNTER — Other Ambulatory Visit: Payer: Self-pay

## 2018-06-22 VITALS — BP 168/70 | HR 55 | Resp 18 | Ht 59.0 in | Wt 141.8 lb

## 2018-06-22 DIAGNOSIS — M19011 Primary osteoarthritis, right shoulder: Secondary | ICD-10-CM | POA: Diagnosis not present

## 2018-06-22 DIAGNOSIS — B356 Tinea cruris: Secondary | ICD-10-CM | POA: Diagnosis not present

## 2018-06-22 DIAGNOSIS — M19042 Primary osteoarthritis, left hand: Secondary | ICD-10-CM

## 2018-06-22 DIAGNOSIS — M19071 Primary osteoarthritis, right ankle and foot: Secondary | ICD-10-CM | POA: Diagnosis not present

## 2018-06-22 DIAGNOSIS — E119 Type 2 diabetes mellitus without complications: Secondary | ICD-10-CM | POA: Diagnosis not present

## 2018-06-22 DIAGNOSIS — M810 Age-related osteoporosis without current pathological fracture: Secondary | ICD-10-CM

## 2018-06-22 DIAGNOSIS — M170 Bilateral primary osteoarthritis of knee: Secondary | ICD-10-CM

## 2018-06-22 DIAGNOSIS — M0579 Rheumatoid arthritis with rheumatoid factor of multiple sites without organ or systems involvement: Secondary | ICD-10-CM

## 2018-06-22 DIAGNOSIS — M19041 Primary osteoarthritis, right hand: Secondary | ICD-10-CM

## 2018-06-22 DIAGNOSIS — Z79899 Other long term (current) drug therapy: Secondary | ICD-10-CM

## 2018-06-22 DIAGNOSIS — R011 Cardiac murmur, unspecified: Secondary | ICD-10-CM | POA: Diagnosis not present

## 2018-06-22 DIAGNOSIS — I10 Essential (primary) hypertension: Secondary | ICD-10-CM

## 2018-06-22 DIAGNOSIS — M47816 Spondylosis without myelopathy or radiculopathy, lumbar region: Secondary | ICD-10-CM | POA: Diagnosis not present

## 2018-06-22 DIAGNOSIS — M19012 Primary osteoarthritis, left shoulder: Secondary | ICD-10-CM

## 2018-06-22 DIAGNOSIS — M19072 Primary osteoarthritis, left ankle and foot: Secondary | ICD-10-CM

## 2018-06-23 ENCOUNTER — Other Ambulatory Visit: Payer: Self-pay | Admitting: Rheumatology

## 2018-06-23 LAB — COMPLETE METABOLIC PANEL WITH GFR
AG Ratio: 1.7 (calc) (ref 1.0–2.5)
ALT: 8 U/L (ref 6–29)
AST: 15 U/L (ref 10–35)
Albumin: 4 g/dL (ref 3.6–5.1)
Alkaline phosphatase (APISO): 62 U/L (ref 37–153)
BUN/Creatinine Ratio: 27 (calc) — ABNORMAL HIGH (ref 6–22)
BUN: 24 mg/dL (ref 7–25)
CO2: 27 mmol/L (ref 20–32)
Calcium: 10.1 mg/dL (ref 8.6–10.4)
Chloride: 109 mmol/L (ref 98–110)
Creat: 0.89 mg/dL — ABNORMAL HIGH (ref 0.60–0.88)
GFR, Est African American: 69 mL/min/{1.73_m2} (ref >=60)
GFR, Est Non African American: 60 mL/min/{1.73_m2} (ref >=60)
Globulin: 2.4 g/dL (calc) (ref 1.9–3.7)
Glucose, Bld: 101 mg/dL — ABNORMAL HIGH (ref 65–99)
Potassium: 3.9 mmol/L (ref 3.5–5.3)
Sodium: 143 mmol/L (ref 135–146)
Total Bilirubin: 0.4 mg/dL (ref 0.2–1.2)
Total Protein: 6.4 g/dL (ref 6.1–8.1)

## 2018-06-23 LAB — CBC WITH DIFFERENTIAL/PLATELET
Absolute Monocytes: 587 cells/uL (ref 200–950)
Basophils Absolute: 40 cells/uL (ref 0–200)
Basophils Relative: 0.7 %
Eosinophils Absolute: 108 cells/uL (ref 15–500)
Eosinophils Relative: 1.9 %
HCT: 38.2 % (ref 35.0–45.0)
Hemoglobin: 12.9 g/dL (ref 11.7–15.5)
Lymphs Abs: 1676 cells/uL (ref 850–3900)
MCH: 31.5 pg (ref 27.0–33.0)
MCHC: 33.8 g/dL (ref 32.0–36.0)
MCV: 93.4 fL (ref 80.0–100.0)
MPV: 8.8 fL (ref 7.5–12.5)
Monocytes Relative: 10.3 %
Neutro Abs: 3289 cells/uL (ref 1500–7800)
Neutrophils Relative %: 57.7 %
Platelets: 271 10*3/uL (ref 140–400)
RBC: 4.09 10*6/uL (ref 3.80–5.10)
RDW: 13.1 % (ref 11.0–15.0)
Total Lymphocyte: 29.4 %
WBC: 5.7 10*3/uL (ref 3.8–10.8)

## 2018-06-23 NOTE — Telephone Encounter (Signed)
Last visit: 06/22/18 Next Visit: 11/23/18 Labs: 06/22/18 CBC WNL. Creatinine is borderline elevated but improved. GFR WNL-69.  Okay to refill per Dr. Estanislado Pandy

## 2018-06-23 NOTE — Progress Notes (Signed)
CBC WNL.  Creatinine is borderline elevated but improved. GFR WNL-69.

## 2018-07-15 ENCOUNTER — Other Ambulatory Visit: Payer: Self-pay | Admitting: Family Medicine

## 2018-08-11 ENCOUNTER — Ambulatory Visit (INDEPENDENT_AMBULATORY_CARE_PROVIDER_SITE_OTHER): Payer: Medicare Other | Admitting: Cardiology

## 2018-08-11 ENCOUNTER — Other Ambulatory Visit: Payer: Self-pay

## 2018-08-11 ENCOUNTER — Encounter: Payer: Self-pay | Admitting: Cardiology

## 2018-08-11 VITALS — BP 144/82 | HR 74 | Temp 97.3°F | Ht 59.0 in | Wt 141.0 lb

## 2018-08-11 DIAGNOSIS — I10 Essential (primary) hypertension: Secondary | ICD-10-CM | POA: Diagnosis not present

## 2018-08-11 DIAGNOSIS — R001 Bradycardia, unspecified: Secondary | ICD-10-CM | POA: Diagnosis not present

## 2018-08-11 DIAGNOSIS — E782 Mixed hyperlipidemia: Secondary | ICD-10-CM | POA: Diagnosis not present

## 2018-08-11 NOTE — Progress Notes (Signed)
Clinical Summary Ms. Eunice is a 85 y.o.female seen today for follow up of the following medical problems.    1.Bradycardia - no recent symptoms. We previously stopped her dilt and have not had recurrent issues since  2. HTN -compliant with meds.   3. Hyperlipidemia  - Jan 2020 TC 163 HDL 58 TG 122 LDL 83 - compliant with statin  4. DM2 - diet controlled - followed by pcp  5. Rheumatoid arthritis - followed by rheumatology Past Medical History:  Diagnosis Date  . Acute cholecystitis 12/16/2010  . Anemia 12/14/2010  . ARF (acute renal failure) ([place]) 12/14/2010  . Bradycardia    Sinus - noted 6/11  . Cholelithiasis 12/14/2010  . Collagen vascular disease ([place])   . Diastolic dysfunction 78/46/9629   Grade 1 per 2-D echocardiogram  . Dysrhythmia   . Essential hypertension, benign   . HTN (hypertension) 12/14/2010  . Hypercalcemia 12/14/2010  . Hypokalemia 12/14/2010  . Mixed hyperlipidemia   . Osteoarthritis   . Rheumatoid arthritis(714.0)      Allergies  Allergen Reactions  . Aspirin Other (See Comments)    REACTION: burns stomach (uncoated)  . Sudafed [Pseudoephedrine Hcl] Other (See Comments)    Increased joint pain     Current Outpatient Medications  Medication Sig Dispense Refill  . acetaminophen (TYLENOL) 500 MG tablet Take 1,000 mg by mouth every 6 (six) hours as needed. Reported on 05/28/2015    . acyclovir (ZOVIRAX) 400 MG tablet TAKE 1 TABLET BY MOUTH TWICE A DAY 60 tablet 5  . alendronate (FOSAMAX) 70 MG tablet TAKE 1 TABLET BY MOUTH EVERY 7 DAYS. TAKE WITH A FULL GLASS OF WATER ON AN EMPTY STOMACH. 12 tablet 1  . amLODipine (NORVASC) 5 MG tablet TAKE 1 TABLET BY MOUTH EVERY DAY 90 tablet 1  . azelastine (ASTELIN) 0.1 % nasal spray Place 2 sprays into both nostrils 2 (two) times daily. Use in each nostril as directed (Patient taking differently: Place 2 sprays into both nostrils as needed. Use in each nostril as directed) 30 mL 12  . folic acid  (FOLVITE) 1 MG tablet TAKE 1 TABLET BY MOUTH EVERY DAY 90 tablet 4  . KLOR-CON M20 20 MEQ tablet TAKE 1 TABLET (20 MEQ TOTAL) BY MOUTH DAILY. 90 tablet 1  . methotrexate (RHEUMATREX) 2.5 MG tablet TAKE 3 TABLETS BY MOUTH ONCE A WEEK. CAUTION:CHEMOTHERAPY. PROTECT FROM LIGHT. 36 tablet 0  . predniSONE (DELTASONE) 1 MG tablet TAKE 4 TABLETS (4 MG TOTAL) BY MOUTH DAILY WITH BREAKFAST. 360 tablet 0  . rosuvastatin (CRESTOR) 5 MG tablet TAKE 1 TABLET BY MOUTH EVERY OTHER DAY 45 tablet 3  . tiZANidine (ZANAFLEX) 2 MG tablet Take one tablet at bedtime for 5 days, then as needed (Patient not taking: Reported on 06/22/2018) 15 tablet 0  . UNABLE TO FIND Diabetic shoes x 1 Inserts x 3  DX E11.9 (Patient not taking: Reported on 06/22/2018) 1 each 0  . UNABLE TO FIND Diabetic shoes x 1 inserts x 3  Dx e11.9 1 each 0  . Vitamin D, Ergocalciferol, (DRISDOL) 1.25 MG (50000 UT) CAPS capsule TAKE 1 CAPSULE BY MOUTH ONCE A WEEK 12 capsule 3   No current facility-administered medications for this visit.      Past Surgical History:  Procedure Laterality Date  . Bilateral cataract surgery  2009  . BUNIONECTOMY Right 2012  . CHOLECYSTECTOMY  12/17/2010   Procedure: LAPAROSCOPIC CHOLECYSTECTOMY;  Surgeon: Donato Heinz;  Location: [facility];  Service: General;  Laterality: N/A;  . COLONOSCOPY  05/22/2011   Procedure: COLONOSCOPY;  Surgeon: Rogene Houston, MD;  Location[facility];  Service: Endoscopy;  Laterality: N/A;  1045  . EYE SURGERY Bilateral    cataract  . GANGLION CYST EXCISION Left 06/02/2012   Procedure: Excision ganglion left dorsal wrist;  Surgeon: Sanjuana Kava, MD;  Location: [facility];  Service: Orthopedics;  Laterality: Left;  . L2-L3 discectomy  2002  . L3-L4, L4-L5 lumbar decompression  2011  . Right shoulder cyst excision  2010  . SPINE SURGERY     twice, approx 2006, 2011     Allergies  Allergen Reactions  . Aspirin Other (See Comments)    REACTION: burns stomach (uncoated)  .  Sudafed [Pseudoephedrine Hcl] Other (See Comments)    Increased joint pain      Family History  Problem Relation Age of Onset  . Stroke Mother   . Cancer Father   . Diabetes Sister   . Diabetes Sister   . Kidney failure Brother   . Kidney failure Brother   . Pancreatic cancer Sister   . Kidney Stones Sister        Only one Kidney  . Arthritis Sister   . Heart disease Daughter      Social History Ms. Pete reports that she has never smoked. She has never used smokeless tobacco. Ms. Staten reports no history of alcohol use.   Review of Systems CONSTITUTIONAL: No weight loss, fever, chills, weakness or fatigue.  HEENT: Eyes: No visual loss, blurred vision, double vision or yellow sclerae.No hearing loss, sneezing, congestion, runny nose or sore throat.  SKIN: No rash or itching.  CARDIOVASCULAR: per hpi RESPIRATORY: No shortness of breath, cough or sputum.  GASTROINTESTINAL: No anorexia, nausea, vomiting or diarrhea. No abdominal pain or blood.  GENITOURINARY: No burning on urination, no polyuria NEUROLOGICAL: No headache, dizziness, syncope, paralysis, ataxia, numbness or tingling in the extremities. No change in bowel or bladder control.  MUSCULOSKELETAL: No muscle, back pain, joint pain or stiffness.  LYMPHATICS: No enlarged nodes. No history of splenectomy.  PSYCHIATRIC: No history of depression or anxiety.  ENDOCRINOLOGIC: No reports of sweating, cold or heat intolerance. No polyuria or polydipsia.  Marland Kitchen   Physical Examination Today's Vitals   08/11/18 0853  BP: (!) 144/82  Pulse: 74  Temp: (!) 97.3 F (36.3 C)  TempSrc: Temporal  SpO2: 97%  Weight: 141 lb (64 kg)  Height: 4\' 11"  (1.499 m)   Body mass index is 28.48 kg/m.  Gen: resting comfortably, no acute distress HEENT: no scleral icterus, pupils equal round and reactive, no palptable cervical adenopathy,  CV: RRR, no m/r/g, no jvd Resp: Clear to auscultation bilaterally GI: abdomen is soft, non-tender,  non-distended, normal bowel sounds, no hepatosplenomegaly MSK: extremities are warm, no edema.  Skin: warm, no rash Neuro:  no focal deficits Psych: appropriate affect    Assessment and Plan  1. Bradycardia - resolved off dilt. EKG today shows SR in the 60s - no further management at this time  2. HTN -manual recheck 125/60, at goal. Continue current meds  3. Hyperlipidemia -she is at goal,c ontinue current meds   F/u as needed   Arnoldo Lenis, M.D.

## 2018-08-11 NOTE — Patient Instructions (Signed)
Medication Instructions:  Your physician recommends that you continue on your current medications as directed. Please refer to the Current Medication list given to you today.   Labwork: none  Testing/Procedures: none  Follow-Up: Your physician recommends that you schedule a follow-up appointment in: AS NEEDED    Any Other Special Instructions Will Be Listed Below (If Applicable).     If you need a refill on your cardiac medications before your next appointment, please call your pharmacy.   

## 2018-08-19 ENCOUNTER — Other Ambulatory Visit: Payer: Self-pay | Admitting: Family Medicine

## 2018-08-19 DIAGNOSIS — R7989 Other specified abnormal findings of blood chemistry: Secondary | ICD-10-CM

## 2018-08-19 DIAGNOSIS — I10 Essential (primary) hypertension: Secondary | ICD-10-CM

## 2018-08-19 DIAGNOSIS — E785 Hyperlipidemia, unspecified: Secondary | ICD-10-CM

## 2018-08-19 DIAGNOSIS — E559 Vitamin D deficiency, unspecified: Secondary | ICD-10-CM

## 2018-08-19 DIAGNOSIS — M0579 Rheumatoid arthritis with rheumatoid factor of multiple sites without organ or systems involvement: Secondary | ICD-10-CM

## 2018-08-19 DIAGNOSIS — E1169 Type 2 diabetes mellitus with other specified complication: Secondary | ICD-10-CM

## 2018-08-27 DIAGNOSIS — Z20828 Contact with and (suspected) exposure to other viral communicable diseases: Secondary | ICD-10-CM | POA: Diagnosis not present

## 2018-09-15 DIAGNOSIS — Z23 Encounter for immunization: Secondary | ICD-10-CM | POA: Diagnosis not present

## 2018-09-20 ENCOUNTER — Other Ambulatory Visit: Payer: Self-pay | Admitting: Family Medicine

## 2018-09-20 ENCOUNTER — Other Ambulatory Visit: Payer: Self-pay | Admitting: Rheumatology

## 2018-09-20 NOTE — Telephone Encounter (Signed)
Last visit: 06/22/18 Next Visit: 11/23/18  Okay to refill per Dr. Estanislado Pandy

## 2018-09-26 ENCOUNTER — Other Ambulatory Visit: Payer: Self-pay | Admitting: *Deleted

## 2018-09-26 DIAGNOSIS — Z79899 Other long term (current) drug therapy: Secondary | ICD-10-CM

## 2018-09-26 MED ORDER — METHOTREXATE 2.5 MG PO TABS: ORAL_TABLET | ORAL | 0 refills | Status: DC

## 2018-09-26 NOTE — Telephone Encounter (Signed)
Received refill request received via fax  Last visit: 06/22/18 Next Visit: 11/23/18 Labs:06/22/18 CBC WNL. Creatinine is borderline elevated but improved. GFR WNL-69.   Patient updating labs tomorrow.   Okay to refill 30 day supply per Dr. Estanislado Pandy

## 2018-09-27 DIAGNOSIS — Z79899 Other long term (current) drug therapy: Secondary | ICD-10-CM | POA: Diagnosis not present

## 2018-09-28 LAB — CBC WITH DIFFERENTIAL/PLATELET
Basophils Absolute: 0.1 10*3/uL (ref 0.0–0.2)
Basos: 1 %
EOS (ABSOLUTE): 0.2 10*3/uL (ref 0.0–0.4)
Eos: 3 %
Hematocrit: 41.9 % (ref 34.0–46.6)
Hemoglobin: 14.1 g/dL (ref 11.1–15.9)
Immature Grans (Abs): 0 10*3/uL (ref 0.0–0.1)
Immature Granulocytes: 0 %
Lymphocytes Absolute: 2.7 10*3/uL (ref 0.7–3.1)
Lymphs: 38 %
MCH: 30.9 pg (ref 26.6–33.0)
MCHC: 33.7 g/dL (ref 31.5–35.7)
MCV: 92 fL (ref 79–97)
Monocytes Absolute: 0.5 10*3/uL (ref 0.1–0.9)
Monocytes: 6 %
Neutrophils Absolute: 3.7 10*3/uL (ref 1.4–7.0)
Neutrophils: 52 %
Platelets: 284 10*3/uL (ref 150–450)
RBC: 4.57 x10E6/uL (ref 3.77–5.28)
RDW: 13.5 % (ref 11.7–15.4)
WBC: 7.1 10*3/uL (ref 3.4–10.8)

## 2018-09-28 LAB — CMP14+EGFR
ALT: 9 IU/L (ref 0–32)
AST: 19 IU/L (ref 0–40)
Albumin/Globulin Ratio: 1.8 (ref 1.2–2.2)
Albumin: 4.6 g/dL (ref 3.6–4.6)
Alkaline Phosphatase: 74 IU/L (ref 39–117)
BUN/Creatinine Ratio: 23 (ref 12–28)
BUN: 22 mg/dL (ref 8–27)
Bilirubin Total: 0.5 mg/dL (ref 0.0–1.2)
CO2: 24 mmol/L (ref 20–29)
Calcium: 11.1 mg/dL — ABNORMAL HIGH (ref 8.7–10.3)
Chloride: 103 mmol/L (ref 96–106)
Creatinine, Ser: 0.97 mg/dL (ref 0.57–1.00)
GFR calc Af Amer: 62 mL/min/{1.73_m2} (ref >59)
GFR calc non Af Amer: 54 mL/min/{1.73_m2} — ABNORMAL LOW (ref >59)
Globulin, Total: 2.6 g/dL (ref 1.5–4.5)
Glucose: 106 mg/dL — ABNORMAL HIGH (ref 65–99)
Potassium: 3.8 mmol/L (ref 3.5–5.2)
Sodium: 141 mmol/L (ref 134–144)
Total Protein: 7.2 g/dL (ref 6.0–8.5)

## 2018-09-29 ENCOUNTER — Telehealth: Payer: Self-pay | Admitting: Rheumatology

## 2018-09-29 NOTE — Telephone Encounter (Signed)
Patient left a voicemail stating she received your call to stop taking Vitamin D and her calcium medication and is requesting a return call to confirm that it is the Rosuvastatin.

## 2018-09-29 NOTE — Telephone Encounter (Signed)
Attempted to contact the patient and left message for patient to call the office.  

## 2018-10-11 ENCOUNTER — Encounter: Payer: Self-pay | Admitting: Family Medicine

## 2018-10-11 ENCOUNTER — Ambulatory Visit (INDEPENDENT_AMBULATORY_CARE_PROVIDER_SITE_OTHER): Payer: Medicare Other | Admitting: Family Medicine

## 2018-10-11 ENCOUNTER — Other Ambulatory Visit: Payer: Self-pay

## 2018-10-11 VITALS — BP 144/82 | Ht 59.0 in | Wt 141.0 lb

## 2018-10-11 DIAGNOSIS — E119 Type 2 diabetes mellitus without complications: Secondary | ICD-10-CM

## 2018-10-11 DIAGNOSIS — M0579 Rheumatoid arthritis with rheumatoid factor of multiple sites without organ or systems involvement: Secondary | ICD-10-CM

## 2018-10-11 DIAGNOSIS — I10 Essential (primary) hypertension: Secondary | ICD-10-CM

## 2018-10-11 DIAGNOSIS — E785 Hyperlipidemia, unspecified: Secondary | ICD-10-CM

## 2018-10-11 DIAGNOSIS — J302 Other seasonal allergic rhinitis: Secondary | ICD-10-CM | POA: Diagnosis not present

## 2018-10-11 NOTE — Progress Notes (Signed)
Virtual Visit via Telephone Note  I connected with Stacy Meyers on 10/11/18 at  8:40 AM EDT by telephone and verified that I am speaking with the correct person using two identifiers.  Location: Patient: home Provider: office   I discussed the limitations, risks, security and privacy concerns of performing an evaluation and management service by telephone and the availability of in person appointments. I also discussed with the patient that there may be a patient responsible charge related to this service. The patient expressed understanding and agreed to proceed.   History of Present Illness: 5 day h/o vertigo, states as soon as she stopped calcium with D , this caused her to become dizzy, relieved by topical prep Denies recent fever or chills. Denies sinus pressure, nasal congestion, ear pain or sore throat. Denies chest congestion, productive cough or wheezing. Denies chest pains, palpitations and leg swelling Denies abdominal pain, nausea, vomiting,diarrhea or constipation.   Denies dysuria, frequency, hesitancy or incontinence. C/o chronic  joint pain, swelling and limitation in mobility. Denies headaches, seizures, numbness, or tingling. Denies depression, anxiety or insomnia. Denies skin break down or rash.       Observations/Objective: BP (!) 144/82   Ht 4\' 11"  (1.499 m)   Wt 141 lb (64 kg)   BMI 28.48 kg/m  from past record Good communication with no confusion and intact memory. Alert and oriented x 3 No signs of respiratory distress during speech    Assessment and Plan: Essential hypertension, benign Adequate control though not at goal No med change DASH diet and commitment to daily physical activity for a minimum of 30 minutes discussed and encouraged, as a part of hypertension management. The importance of attaining a healthy weight is also discussed.  BP/Weight 10/11/2018 08/11/2018 06/22/2018 05/27/2018 01/18/2018 01/17/2018 [id number]  Systolic BP 123456 123456 [id number]  0000000 0000000 [id number] [id number]  Diastolic BP 82 82 70 73 73 70 72  Wt. (Lbs) 141 141 141.8 134 134.6 137 140.6  BMI 28.48 28.48 28.64 27.06 27.19 27.67 28.4       Type 2 diabetes, diet controlled ([place]) [name] is reminded of the importance of commitment to daily physical activity for 30 minutes or more, as able and the need to limit carbohydrate intake to 30 to 60 grams per meal to help with blood sugar control.      [name] is reminded of the importance of daily foot exam, annual eye examination, and good blood sugar, blood pressure and cholesterol control.  Diabetic Labs Latest Ref Rng & Units 09/27/2018 06/22/2018 01/17/2018 01/13/2018 08/13/2017  HbA1c <5.7 % of total Hgb - - - 6.5(H) -  Microalbumin Not Estab. ug/mL - - 12.4(H) - -  Micro/Creat Ratio <30 mcg/mg creat - - - - -  Chol <200 mg/dL - - - 163 -  HDL >50 mg/dL - - - 58 -  Calc LDL mg/dL (calc) - - - 83 -  Triglycerides <150 mg/dL - - - 122 -  Creatinine 0.57 - 1.00 mg/dL 0.97 0.89(H) - 0.92(H) 0.96(H)   BP/Weight 10/11/2018 08/11/2018 06/22/2018 05/27/2018 01/18/2018 01/17/2018 [id number]  Systolic BP 123456 123456 [id number] 0000000 0000000 [id number] 123456  Diastolic BP 82 82 70 73 73 70 72  Wt. (Lbs) 141 141 141.8 134 134.6 137 140.6  BMI 28.48 28.48 28.64 27.06 27.19 27.67 28.4   Foot/eye exam completion dates Latest Ref Rng & Units 11/29/2017 08/16/2017  Eye Exam No Retinopathy No Retinopathy -  Foot Form Completion - - Done  Updated lab needed at/ before next visit.      Hyperlipidemia LDL goal <100 Hyperlipidemia:Low fat diet discussed and encouraged.   Lipid Panel  Lab Results  Component Value Date   CHOL 163 01/13/2018   HDL 58 01/13/2018   LDLCALC 83 01/13/2018   TRIG 122 01/13/2018   CHOLHDL 2.8 01/13/2018       Seasonal allergies Current mild flare , resulting in vertigo, no resolved, pt advised to take allergy meds regularly  Rheumatoid arthritis with rheumatoid factor of multiple sites without organ or systems involvement  ([place]) Treated by Rheumatology, stable, with improved function and reduced joint pain    Follow Up Instructions:    I discussed the assessment and treatment plan with the patient. The patient was provided an opportunity to ask questions and all were answered. The patient agreed with the plan and demonstrated an understanding of the instructions.   The patient was advised to call back or seek an in-person evaluation if the symptoms worsen or if the condition fails to improve as anticipated.  I provided 22 minutes of non-face-to-face time during this encounter.   Stacy Nakayama, MD

## 2018-10-11 NOTE — Patient Instructions (Addendum)
F/U in office with MD for lab and BP review in 2 to 3 weeks I agree to stop calcium and vit D  Please get fasting lipid, cmp and EGFR, PTH 3 to 5 days before visit  Thankful vertigo is better, call for tablets if needed

## 2018-10-16 ENCOUNTER — Encounter: Payer: Self-pay | Admitting: Family Medicine

## 2018-10-16 NOTE — Assessment & Plan Note (Addendum)
Ms. Boka is reminded of the importance of commitment to daily physical activity for 30 minutes or more, as able and the need to limit carbohydrate intake to 30 to 60 grams per meal to help with blood sugar control.      Ms. Arington is reminded of the importance of daily foot exam, annual eye examination, and good blood sugar, blood pressure and cholesterol control.  Diabetic Labs Latest Ref Rng & Units 09/27/2018 06/22/2018 01/17/2018 01/13/2018 08/13/2017  HbA1c <5.7 % of total Hgb - - - 6.5(H) -  Microalbumin Not Estab. ug/mL - - 12.4(H) - -  Micro/Creat Ratio <30 mcg/mg creat - - - - -  Chol <200 mg/dL - - - 163 -  HDL >50 mg/dL - - - 58 -  Calc LDL mg/dL (calc) - - - 83 -  Triglycerides <150 mg/dL - - - 122 -  Creatinine 0.57 - 1.00 mg/dL 0.97 0.89(H) - 0.92(H) 0.96(H)   BP/Weight 10/11/2018 08/11/2018 06/22/2018 05/27/2018 01/18/2018 01/17/2018 [id number]  Systolic BP 123456 123456 [id number] 0000000 0000000 [id number] 123456  Diastolic BP 82 82 70 73 73 70 72  Wt. (Lbs) 141 141 141.8 134 134.6 137 140.6  BMI 28.48 28.48 28.64 27.06 27.19 27.67 28.4   Foot/eye exam completion dates Latest Ref Rng & Units 11/29/2017 08/16/2017  Eye Exam No Retinopathy No Retinopathy -  Foot Form Completion - - Done   Updated lab needed at/ before next visit.

## 2018-10-16 NOTE — Assessment & Plan Note (Signed)
Adequate control though not at goal No med change DASH diet and commitment to daily physical activity for a minimum of 30 minutes discussed and encouraged, as a part of hypertension management. The importance of attaining a healthy weight is also discussed.  BP/Weight 10/11/2018 08/11/2018 06/22/2018 05/27/2018 01/18/2018 01/17/2018 [id number]  Systolic BP 123456 123456 [id number] 0000000 0000000 [id number] 123456  Diastolic BP 82 82 70 73 73 70 72  Wt. (Lbs) 141 141 141.8 134 134.6 137 140.6  BMI 28.48 28.48 28.64 27.06 27.19 27.67 28.4

## 2018-10-16 NOTE — Assessment & Plan Note (Addendum)
Current mild flare , resulting in vertigo, no resolved, pt advised to take allergy meds regularly

## 2018-10-16 NOTE — Assessment & Plan Note (Signed)
Hyperlipidemia:Low fat diet discussed and encouraged.   Lipid Panel  Lab Results  Component Value Date   CHOL 163 01/13/2018   HDL 58 01/13/2018   LDLCALC 83 01/13/2018   TRIG 122 01/13/2018   CHOLHDL 2.8 01/13/2018

## 2018-10-16 NOTE — Assessment & Plan Note (Signed)
Treated by Rheumatology, stable, with improved function and reduced joint pain

## 2018-10-17 ENCOUNTER — Other Ambulatory Visit: Payer: Self-pay | Admitting: Rheumatology

## 2018-10-17 ENCOUNTER — Ambulatory Visit: Payer: Medicare Other | Admitting: Family Medicine

## 2018-10-18 NOTE — Telephone Encounter (Signed)
Last Visit: 06/22/18 Next Visit: 11/23/18 Labs: 09/27/18 CBC WNL.Calcium is elevated-11.1  Okay to refill per Dr. Estanislado Pandy

## 2018-10-24 ENCOUNTER — Other Ambulatory Visit: Payer: Self-pay | Admitting: Rheumatology

## 2018-10-24 DIAGNOSIS — Z20828 Contact with and (suspected) exposure to other viral communicable diseases: Secondary | ICD-10-CM | POA: Diagnosis not present

## 2018-11-04 DIAGNOSIS — I10 Essential (primary) hypertension: Secondary | ICD-10-CM | POA: Diagnosis not present

## 2018-11-04 DIAGNOSIS — E785 Hyperlipidemia, unspecified: Secondary | ICD-10-CM | POA: Diagnosis not present

## 2018-11-07 LAB — COMPLETE METABOLIC PANEL WITH GFR
AG Ratio: 1.8 (calc) (ref 1.0–2.5)
ALT: 8 U/L (ref 6–29)
AST: 12 U/L (ref 10–35)
Albumin: 4 g/dL (ref 3.6–5.1)
Alkaline phosphatase (APISO): 59 U/L (ref 37–153)
BUN/Creatinine Ratio: 25 (calc) — ABNORMAL HIGH (ref 6–22)
BUN: 24 mg/dL (ref 7–25)
CO2: 27 mmol/L (ref 20–32)
Calcium: 10.2 mg/dL (ref 8.6–10.4)
Chloride: 104 mmol/L (ref 98–110)
Creat: 0.96 mg/dL — ABNORMAL HIGH (ref 0.60–0.88)
GFR, Est African American: 63 mL/min/{1.73_m2} (ref >=60)
GFR, Est Non African American: 54 mL/min/{1.73_m2} — ABNORMAL LOW (ref >=60)
Globulin: 2.2 g/dL (calc) (ref 1.9–3.7)
Glucose, Bld: 94 mg/dL (ref 65–99)
Potassium: 4.1 mmol/L (ref 3.5–5.3)
Sodium: 141 mmol/L (ref 135–146)
Total Bilirubin: 0.4 mg/dL (ref 0.2–1.2)
Total Protein: 6.2 g/dL (ref 6.1–8.1)

## 2018-11-07 LAB — PTH, INTACT AND CALCIUM
Calcium: 10.2 mg/dL (ref 8.6–10.4)
PTH: 44 pg/mL (ref 14–64)

## 2018-11-07 LAB — LIPID PANEL
Cholesterol: 194 mg/dL (ref <200)
HDL: 49 mg/dL — ABNORMAL LOW (ref >=50)
LDL Cholesterol (Calc): 120 mg/dL (calc) — ABNORMAL HIGH
Non-HDL Cholesterol (Calc): 145 mg/dL (calc) — ABNORMAL HIGH (ref <130)
Total CHOL/HDL Ratio: 4 (calc) (ref <5.0)
Triglycerides: 136 mg/dL (ref <150)

## 2018-11-08 ENCOUNTER — Ambulatory Visit: Payer: Medicare Other | Admitting: Family Medicine

## 2018-11-09 NOTE — Progress Notes (Signed)
Office Visit Note  Patient: Stacy Meyers             Date of Birth: Jul 13, 1932           MRN: [id number]             PCP: Fayrene Helper, MD Referring: Fayrene Helper, MD Visit Date: 11/23/2018 Occupation: @GUAROCC @  Subjective:  Right knee joint pain   History of Present Illness: Stacy Meyers is a 85 y.o. female with history of seropositive rheumatoid arthritis and osteoarthritis.  She is taking MTX 3 tablets po once weekly and folic acid 1 mg po daily.  She continues to take prednisone 4 mg po daily. Denies missing any doses. Denies any recent infection. Denies any recent flares. She reports swelling and pain in her right knee. She continues to walk with a cane.  She denies any recent falls.  Activities of Daily Living:  Patient reports morning stiffness for 5-10 minutes.   Patient Reports nocturnal pain.  Difficulty dressing/grooming: Denies Difficulty climbing stairs: Reports Difficulty getting out of chair: Reports Difficulty using hands for taps, buttons, cutlery, and/or writing: Denies  Review of Systems  Constitutional: Positive for fatigue.  HENT: Positive for mouth dryness. Negative for mouth sores and nose dryness.   Eyes: Negative for pain, itching, visual disturbance and dryness.  Respiratory: Negative for cough, hemoptysis, shortness of breath, wheezing and difficulty breathing.   Cardiovascular: Negative for chest pain, palpitations, hypertension and swelling in legs/feet.  Gastrointestinal: Positive for diarrhea. Negative for blood in stool and constipation.  Endocrine: Negative for increased urination.  Genitourinary: Negative for difficulty urinating and painful urination.  Musculoskeletal: Positive for arthralgias, joint pain, joint swelling and morning stiffness. Negative for myalgias, muscle weakness, muscle tenderness and myalgias.  Skin: Negative for color change, pallor, rash, hair loss, nodules/bumps, skin tightness, ulcers and sensitivity to  sunlight.  Allergic/Immunologic: Negative for susceptible to infections.  Neurological: Negative for numbness, headaches, memory loss and weakness.  Hematological: Negative for swollen glands.  Psychiatric/Behavioral: Negative for depressed mood, confusion and sleep disturbance. The patient is not nervous/anxious.     PMFS History:  Patient Active Problem List   Diagnosis Date Noted   Cough with exposure to COVID-19 virus 11/20/2018   Type 2 diabetes mellitus with complication, without long-term current use of insulin ([place]) 11/20/2018   Fibroids 05/08/2016   Spondylosis of lumbar region without myelopathy or radiculopathy 12/24/2015   Solitary bone cyst of right shoulder 05/28/2015   Right knee pain 05/28/2015   Osteoarthritis of both shoulders 05/28/2015   Overweight (BMI 25.0-29.9) 04/28/2014   Ganglion cyst of wrist [id number]   Systolic murmur 123456   Diastolic dysfunction [id number]   Seasonal allergies 08/28/2010   Type 2 diabetes, diet controlled ([place]) 07/25/2009   Herpes simplex virus (HSV) infection 09/16/2008   Rheumatoid arthritis with rheumatoid factor of multiple sites without organ or systems involvement ([place]) 09/16/2008   Hyperlipidemia LDL goal <100 05/23/2008   Essential hypertension, benign 05/23/2008    Past Medical History:  Diagnosis Date   Acute cholecystitis 12/16/2010   Anemia 12/14/2010   ARF (acute renal failure) ([place]) 12/14/2010   Bradycardia    Sinus - noted 6/11   Cholelithiasis 12/14/2010   Collagen vascular disease ([place])    Diastolic dysfunction [id number]   Grade 1 per 2-D echocardiogram   Dysrhythmia    Essential hypertension, benign    HTN (hypertension) 12/14/2010   Hypercalcemia 12/14/2010   Hypokalemia 12/14/2010   Mixed hyperlipidemia  Osteoarthritis    Rheumatoid arthritis(714.0)     Family History  Problem Relation Age of Onset   Stroke Mother    Cancer Father    Diabetes Sister     Diabetes Sister    Kidney failure Brother    Kidney failure Brother    Pancreatic cancer Sister    Kidney Stones Sister        Only one Kidney   Arthritis Sister    Heart disease Daughter    Past Surgical History:  Procedure Laterality Date   Bilateral cataract surgery  2009   BUNIONECTOMY Right 2012   CHOLECYSTECTOMY  12/17/2010   Procedure: LAPAROSCOPIC CHOLECYSTECTOMY;  Surgeon: Donato Heinz;  Location: [facility];  Service: General;  Laterality: N/A;   COLONOSCOPY  05/22/2011   Procedure: COLONOSCOPY;  Surgeon: Rogene Houston, MD;  Location: [facility];  Service: Endoscopy;  Laterality: N/A;  1045   EYE SURGERY Bilateral    cataract   GANGLION CYST EXCISION Left 06/02/2012   Procedure: Excision ganglion left dorsal wrist;  Surgeon: Sanjuana Kava, MD;  Location: [facility];  Service: Orthopedics;  Laterality: Left;   L2-L3 discectomy  2002   L3-L4, L4-L5 lumbar decompression  2011   Right shoulder cyst excision  2010   SPINE SURGERY     twice, approx 2006, 2011   Social History   Social History Narrative   Husband passed away May 02, 2016. It has been financially harder to pay the bills since his passing, but she is making it. Has grandchildren around which makes it easier.    Immunization History  Administered Date(s) Administered   Influenza Split 11/07/2013   Influenza Whole 10/28/2009, 09/17/2010   Influenza, High Dose Seasonal PF 09/29/2016   Influenza,inj,Quad PF,6+ Mos 09/03/2014   Influenza-Unspecified 10/05/2013, 09/29/2016   PPD Test 05/14/2014, 05/28/2015   Pneumococcal Conjugate-13 08/03/2013   Pneumococcal Polysaccharide-23 08/28/2009   Tdap 04/29/2010     Objective: Vital Signs: BP 140/71 (BP Location: Left Arm, Patient Position: Sitting, Cuff Size: Normal)    Pulse 62    Resp 13    Ht 4\' 11"  (1.499 m)    Wt 133 lb (60.3 kg)    BMI 26.86 kg/m    Physical Exam Vitals signs and nursing note reviewed.  Constitutional:       Appearance: She is well-developed.  HENT:     Head: Normocephalic and atraumatic.  Eyes:     Conjunctiva/sclera: Conjunctivae normal.  Neck:     Musculoskeletal: Normal range of motion.  Cardiovascular:     Rate and Rhythm: Normal rate and regular rhythm.     Heart sounds: Normal heart sounds.  Pulmonary:     Effort: Pulmonary effort is normal.     Breath sounds: Normal breath sounds.  Abdominal:     General: Bowel sounds are normal.     Palpations: Abdomen is soft.  Lymphadenopathy:     Cervical: No cervical adenopathy.  Skin:    General: Skin is warm and dry.     Capillary Refill: Capillary refill takes less than 2 seconds.  Neurological:     Mental Status: She is alert and oriented to person, place, and time.  Psychiatric:        Behavior: Behavior normal.      Musculoskeletal Exam: C-spine limited range of motion.  Thoracic kyphosis noted.  Limited range of motion lumbar spine.  Shoulder joint abduction to about 30 degrees bilaterally.  Elbow joints good range of motion no  tenderness or inflammation.  She is limited range of motion of bilateral wrist joints.  She synovial thickening and ulnar deviation of bilateral MCP joints.  Synovial thickening of PIP and DIP joints noted.  Synovial thickening over the ulnar styloid bilaterally.  Left knee has good range of motion with no warmth or effusion.  Right knee has warmth and inflammation as well as limited extension.  Valgus deformity of the right knee noted.  No tenderness or inflammation of the ankle joints noted on exam.   CDAI Exam: CDAI Score: -- Patient Global: --; Provider Global: -- Swollen: 1 ; Tender: 1  Joint Exam      Right  Left  Knee  Swollen Tender        Investigation: No additional findings.  Imaging: Xr Knee 3 View Right  Result Date: 11/23/2018 Severe lateral compartment narrowing with calcifications in the lateral aspect of the knee joint was noted.  Severe patellofemoral narrowing was noted.  Impression: These findings are consistent with severe osteoarthritis and severe chondromalacia patella.   Recent Labs: Lab Results  Component Value Date   WBC 7.1 09/27/2018   HGB 14.1 09/27/2018   PLT 284 09/27/2018   NA 141 11/04/2018   K 4.1 11/04/2018   CL 104 11/04/2018   CO2 27 11/04/2018   GLUCOSE 94 11/04/2018   BUN 24 11/04/2018   CREATININE 0.96 (H) 11/04/2018   BILITOT 0.4 11/04/2018   ALKPHOS 74 09/27/2018   AST 12 11/04/2018   ALT 8 11/04/2018   PROT 6.2 11/04/2018   ALBUMIN 4.6 09/27/2018   CALCIUM 10.2 11/04/2018   CALCIUM 10.2 11/04/2018   GFRAA 63 11/04/2018    Speciality Comments: No specialty comments available.  Procedures:  Large Joint Inj: R knee on 11/23/2018 11:54 AM Indications: pain Details: 27 G 1.5 in needle, medial approach  Arthrogram: No  Medications: 1.5 mL lidocaine 1 %; 40 mg triamcinolone acetonide 40 MG/ML Aspirate: 0 mL Outcome: tolerated well, no immediate complications Procedure, treatment alternatives, risks and benefits explained, specific risks discussed. Consent was given by the patient. Immediately prior to procedure a time out was called to verify the correct patient, procedure, equipment, support staff and site/side marked as required. Patient was prepped and draped in the usual sterile fashion.     Allergies: Aspirin and Sudafed [pseudoephedrine hcl]   Assessment / Plan:     Visit Diagnoses: Rheumatoid arthritis with rheumatoid factor of multiple sites without organ or systems involvement [facility]): She presents today with persistent right knee joint pain.  She has warmth and inflammation of the right knee but no effusion was noted.  She has limited extension and valgus deformity of the right knee.  X-rays of the right knee revealed severe osteoarthritis and severe chondromalacia patella.  She requested a right knee joint cortisone injection.  She tolerated procedure well.  She has no other joint pain or joint swelling at this  time.  She is clinically doing well on methotrexate 3 tablets by mouth once weekly, folic acid 1 mg by mouth daily, prednisone 4 mg by mouth daily.  She does not need any refills at this time.  She was advised to notify us if she develops increased joint pain or joint swelling.  She will follow-up in the office in 5 months.  High risk medication use - Methotrexate 3 tablets every 7 days, folic acid 1 mg daily, and prednisone 1 mg 4 tablets daily.  CMP was drawn on 11/04/2018.  CBC was drawn on 09/27/2018.  Primary osteoarthritis of both hands: She has PIP and DIP synovial thickening consistent with osteoarthritis of both hands.  She has no tenderness or inflammation at this time.  She has no discomfort in her hands.  Joint protection and muscle strengthening were discussed.  Primary osteoarthritis of both shoulders: She has limited range of motion of bilateral shoulder joints to about 30 degrees abduction.  She has no discomfort at this time.  She has difficulties with some ADLs due to the limitation in the range of motion.  Primary osteoarthritis of both knees: She presents today with persistent right knee joint pain.  She has warmth and inflammation on exam but no joint effusion was noted.  She has limited extension of the right knee with valgus deformity.  X-rays of the right knee were obtained today which revealed severe osteoarthritis.  She requested a right knee joint cortisone injection.  She tolerated the procedure well.  Aftercare was discussed.  She was advised to monitor blood pressure and blood sugar closely following the cortisone injection.  Chronic pain of right knee -She presents today with right knee joint pain.  She has warmth and inflammation on exam.  X-rays of the right knee revealed severe osteoarthritis.  She requested a right knee joint cortisone injection.  She tolerated the procedure well.  Plan: XR KNEE 3 VIEW RIGHT  Primary osteoarthritis of both feet: She has no discomfort in  her feet at this time.  Spondylosis of lumbar region without myelopathy or radiculopathy: She has limited range of motion but no discomfort on exam.  No symptoms of radiculopathy noted.  Age-related osteoporosis without current pathological fracture: She is taking Fosamax 70 mg 1 tablet by mouth once weekly.  Other medical conditions are listed as follows  Systolic murmur  Essential hypertension  Tinea cruris  Type 2 diabetes, diet controlled ([place])    Orders: Orders Placed This Encounter  Procedures   Large Joint Inj   XR KNEE 3 VIEW RIGHT   No orders of the defined types were placed in this encounter.   Face-to-face time spent with patient was 30 minutes. Greater than 50% of time was spent in counseling and coordination of care.  Follow-Up Instructions: Return in about 5 months (around 04/23/2019) for Osteoarthritis, Rheumatoid arthritis.   Ofilia Neas, PA-C   I examined and evaluated the patient with Hazel Sams PA.  Patient experienced a lot of pain and discomfort in her right knee joint today.  X-rays revealed severe osteoarthritis and severe chondromalacia patella.  She is not ready for having total knee replacement.  Per her request after informed consent was obtained right knee joint was injected with cortisone as described above.  She tolerated the procedure well.  The plan of care was discussed as noted above.  Bo Merino, MD  Note - This record has been created using Editor, commissioning.  Chart creation errors have been sought, but may not always  have been located. Such creation errors do not reflect on  the standard of medical care.

## 2018-11-16 ENCOUNTER — Other Ambulatory Visit: Payer: Self-pay | Admitting: *Deleted

## 2018-11-16 DIAGNOSIS — Z20822 Contact with and (suspected) exposure to covid-19: Secondary | ICD-10-CM

## 2018-11-17 ENCOUNTER — Other Ambulatory Visit: Payer: Self-pay

## 2018-11-17 ENCOUNTER — Ambulatory Visit (INDEPENDENT_AMBULATORY_CARE_PROVIDER_SITE_OTHER): Payer: Medicare Other | Admitting: Family Medicine

## 2018-11-17 VITALS — BP 144/82 | Temp 98.6°F | Ht 59.0 in | Wt 141.0 lb

## 2018-11-17 DIAGNOSIS — M0579 Rheumatoid arthritis with rheumatoid factor of multiple sites without organ or systems involvement: Secondary | ICD-10-CM | POA: Diagnosis not present

## 2018-11-17 DIAGNOSIS — E119 Type 2 diabetes mellitus without complications: Secondary | ICD-10-CM | POA: Diagnosis not present

## 2018-11-17 DIAGNOSIS — E118 Type 2 diabetes mellitus with unspecified complications: Secondary | ICD-10-CM

## 2018-11-17 DIAGNOSIS — Z20828 Contact with and (suspected) exposure to other viral communicable diseases: Secondary | ICD-10-CM

## 2018-11-17 DIAGNOSIS — R058 Other specified cough: Secondary | ICD-10-CM

## 2018-11-17 DIAGNOSIS — R05 Cough: Secondary | ICD-10-CM

## 2018-11-17 DIAGNOSIS — E785 Hyperlipidemia, unspecified: Secondary | ICD-10-CM

## 2018-11-17 MED ORDER — PREDNISONE 5 MG PO TABS: 5.0000 mg | ORAL_TABLET | Freq: Two times a day (BID) | ORAL | 0 refills | Status: AC

## 2018-11-17 MED ORDER — MONTELUKAST SODIUM 10 MG PO TABS: 10.0000 mg | ORAL_TABLET | Freq: Every day | ORAL | 3 refills | Status: DC

## 2018-11-17 NOTE — Progress Notes (Signed)
Virtual Visit via Telephone Note  I connected with Stacy Meyers on 11/17/18 at 10:00 AM EST by telephone and verified that I am speaking with the correct person using two identifiers.  Location: Patient: home Provider: office   I discussed the limitations, risks, security and privacy concerns of performing an evaluation and management service by telephone and the availability of in person appointments. I also discussed with the patient that there may be a patient responsible charge related to this service. The patient expressed understanding and agreed to proceed.   History of Present Illness: Cough since 10/31, no fever, c/o chills on Nov 06, 2019. Initially mucus , always clear has been non productive  10 days  Noted good energy level in past 2 to 3 days , only had fatigue for 3 days after onset Cough bothers her at night,no response to mucinex dm Denies ear pain, sore throat Grand daughter who cares for her is covid positive Observations/Objective: BP (!) 144/82   Temp 98.6 F (37 C)   Ht 4\' 11"  (1.499 m)   Wt 141 lb (64 kg)   BMI 28.48 kg/m  Good communication with no confusion and intact memory. Alert and oriented x 3 No signs of respiratory distress during speech    Assessment and Plan: Type 2 diabetes, diet controlled (Stacy Meyers) Stacy Meyers is reminded of the importance of commitment to daily physical activity for 30 minutes or more, as able and the need to limit carbohydrate intake to 30 to 60 grams per meal to help with blood sugar control.   The need to take medication as prescribed, test blood sugar as directed, and to call between visits if there is a concern that blood sugar is uncontrolled is also discussed.   Stacy Meyers is reminded of the importance of daily foot exam, annual eye examination, and good blood sugar, blood pressure and cholesterol control.  Diabetic Labs Latest Ref Rng & Units 11/04/2018 09/27/2018 06/22/2018 01/17/2018 01/13/2018  HbA1c <5.7 % of total Hgb - - -  - 6.5(H)  Microalbumin Not Estab. ug/mL - - - 12.4(H) -  Micro/Creat Ratio <30 mcg/mg creat - - - - -  Chol <200 mg/dL 194 - - - 163  HDL > OR = 50 mg/dL 49(L) - - - 58  Calc LDL mg/dL (calc) 120(H) - - - 83  Triglycerides <150 mg/dL 136 - - - 122  Creatinine 0.60 - 0.88 mg/dL 0.96(H) 0.97 0.89(H) - 0.92(H)   BP/Weight 11/17/2018 10/11/2018 08/11/2018 06/22/2018 05/27/2018 01/18/2018 [id number]  Systolic BP 123456 123456 123456 [id number] 0000000 0000000 [id number]  Diastolic BP 82 82 82 70 73 73 70  Wt. (Lbs) 141 141 141 141.8 134 134.6 137  BMI 28.48 28.48 28.48 28.64 27.06 27.19 27.67   Foot/eye exam completion dates Latest Ref Rng & Units 11/29/2017 08/16/2017  Eye Exam No Retinopathy No Retinopathy -  Foot Form Completion - - Done      Updated lab needed at/ before next visit. Foot exam overdyue also , needs in office appt  Hyperlipidemia LDL goal <100 Uncontrolled,. lDL above goal Hyperlipidemia:Low fat diet discussed and encouraged.   Lipid Panel  Lab Results  Component Value Date   CHOL 194 11/04/2018   HDL 49 (L) 11/04/2018   LDLCALC 120 (H) 11/04/2018   TRIG 136 11/04/2018   CHOLHDL 4.0 11/04/2018     Need to verify statin cpmpliance , and will need to increase dose of crestorif she is taking this daily currently  Rheumatoid arthritis  with rheumatoid factor of multiple sites without organ or systems involvement (Stacy Meyers) Followed closely by Rheumatology amd overall doing well  Cough with exposure to COVID-19 virus Dry cough, also a aggravated by uncontrolled allergy, 5 day course of prednisone prescribed, await Covid test result, remain inisolation    Follow Up Instructions:    I discussed the assessment and treatment plan with the patient. The patient was provided an opportunity to ask questions and all were answered. The patient agreed with the plan and demonstrated an understanding of the instructions.   The patient was advised to call back or seek an in-person evaluation if the symptoms  worsen or if the condition fails to improve as anticipated.  I provided 21 minutes of non-face-to-face time during this encounter.   [name], MD

## 2018-11-17 NOTE — Patient Instructions (Addendum)
F/U as before, call if you need me before  I believe that your cough is aggravated by uncontrolled allergies.  Thankful that your energy and appetiote are good , and that you are better now than 2 weeks ago  I have prescribed prednisone for 5 days only, do not take the 1 mg tablet when you are taking those. I have prescribed and allergy tablet for you to take every day. I will follow up on your Covid test  Thanks for choosing [facility], we consider it a privelige to serve you. COVID-19: How to Protect Yourself and Others Know how it spreads  There is currently no vaccine to prevent coronavirus disease 2019 (COVID-19).  The best way to prevent illness is to avoid being exposed to this virus.  The virus is thought to spread mainly from person-to-person. ? Between people who are in close contact with one another (within about 6 feet). ? Through respiratory droplets produced when an infected person coughs, sneezes or talks. ? These droplets can land in the mouths or noses of people who are nearby or possibly be inhaled into the lungs. ? Some recent studies have suggested that COVID-19 may be spread by people who are not showing symptoms. Everyone should Clean your hands often  Wash your hands often with soap and water for at least 20 seconds especially after you have been in a public place, or after blowing your nose, coughing, or sneezing.  If soap and water are not readily available, use a hand sanitizer that contains at least 60% alcohol. Cover all surfaces of your hands and rub them together until they feel dry.  Avoid touching your eyes, nose, and mouth with unwashed hands. Avoid close contact  Stay home if you are sick.  Avoid close contact with people who are sick.  Put distance between yourself and other people. ? Remember that some people without symptoms may be able to spread virus. ? This is especially important for people who are at higher risk of getting  very [employer].cy Cover your mouth and nose with a cloth face cover when around others  You could spread COVID-19 to others even if you do not feel sick.  Everyone should wear a cloth face cover when they have to go out in public, for example to the grocery store or to pick up other necessities. ? Cloth face coverings should not be placed on young children under age 50, anyone who has trouble breathing, or is unconscious, incapacitated or otherwise unable to remove the mask without assistance.  The cloth face cover is meant to protect other people in case you are infected.  Do NOT use a facemask meant for a Dietitian.  Continue to keep about 6 feet between yourself and others. The cloth face cover is not a substitute for social distancing. Cover coughs and sneezes  If you are in a private setting and do not have on your cloth face covering, remember to always cover your mouth and nose with a tissue when you cough or sneeze or use the inside of your elbow.  Throw used tissues in the trash.  Immediately wash your hands with soap and water for at least 20 seconds. If soap and water are not readily available, clean your hands with a hand sanitizer that contains at least 60% alcohol. Clean and disinfect  Clean AND disinfect frequently touched surfaces daily. This includes tables, doorknobs, light switches, countertops, handles, desks, phones, keyboards, toilets, faucets, and sinks. [employer]  If surfaces are dirty, clean them: Use detergent or soap and water prior to disinfection.  Then, use a household disinfectant. You can see a list of EPA-registered household disinfectants here. michellinders.com 05/10/2018 This information is not intended to replace advice given to you by your health care provider. Make sure you discuss any  questions you have with your health care provider. Document Released: 04/19/2018 Document Revised: 05/18/2018 Document Reviewed: 04/19/2018 Elsevier Patient Education  [place].

## 2018-11-18 LAB — NOVEL CORONAVIRUS, NAA: SARS-CoV-2, NAA: NOT DETECTED

## 2018-11-19 ENCOUNTER — Other Ambulatory Visit: Payer: Self-pay | Admitting: Rheumatology

## 2018-11-20 ENCOUNTER — Encounter: Payer: Self-pay | Admitting: Family Medicine

## 2018-11-20 ENCOUNTER — Telehealth: Payer: Self-pay | Admitting: Family Medicine

## 2018-11-20 DIAGNOSIS — Z20822 Contact with and (suspected) exposure to covid-19: Secondary | ICD-10-CM | POA: Insufficient documentation

## 2018-11-20 DIAGNOSIS — R058 Other specified cough: Secondary | ICD-10-CM | POA: Insufficient documentation

## 2018-11-20 DIAGNOSIS — E118 Type 2 diabetes mellitus with unspecified complications: Secondary | ICD-10-CM | POA: Insufficient documentation

## 2018-11-20 DIAGNOSIS — Z20828 Contact with and (suspected) exposure to other viral communicable diseases: Secondary | ICD-10-CM | POA: Insufficient documentation

## 2018-11-20 DIAGNOSIS — E119 Type 2 diabetes mellitus without complications: Secondary | ICD-10-CM

## 2018-11-20 NOTE — Assessment & Plan Note (Signed)
Uncontrolled,. lDL above goal Hyperlipidemia:Low fat diet discussed and encouraged.   Lipid Panel  Lab Results  Component Value Date   CHOL 194 11/04/2018   HDL 49 (L) 11/04/2018   LDLCALC 120 (H) 11/04/2018   TRIG 136 11/04/2018   CHOLHDL 4.0 11/04/2018     Need to verify statin cpmpliance , and will need to increase dose of crestorif she is taking this daily currently

## 2018-11-20 NOTE — Assessment & Plan Note (Signed)
Dry cough, also a aggravated by uncontrolled allergy, 5 day course of prednisone prescribed, await Covid test result, remain inisolation

## 2018-11-20 NOTE — Telephone Encounter (Signed)
pls letpt know she is covid Negative. Needs HBa1C, this is past due. Recent cholesterol is too high, LDL also above goal, please let me know if taking her statin and the dose, thanks Also will need microalb end Jan , you may order

## 2018-11-20 NOTE — Assessment & Plan Note (Signed)
Ms. Marston is reminded of the importance of commitment to daily physical activity for 30 minutes or more, as able and the need to limit carbohydrate intake to 30 to 60 grams per meal to help with blood sugar control.   The need to take medication as prescribed, test blood sugar as directed, and to call between visits if there is a concern that blood sugar is uncontrolled is also discussed.   Ms. Karimi is reminded of the importance of daily foot exam, annual eye examination, and good blood sugar, blood pressure and cholesterol control.  Diabetic Labs Latest Ref Rng & Units 11/04/2018 09/27/2018 06/22/2018 01/17/2018 01/13/2018  HbA1c <5.7 % of total Hgb - - - - 6.5(H)  Microalbumin Not Estab. ug/mL - - - 12.4(H) -  Micro/Creat Ratio <30 mcg/mg creat - - - - -  Chol <200 mg/dL 194 - - - 163  HDL > OR = 50 mg/dL 49(L) - - - 58  Calc LDL mg/dL (calc) 120(H) - - - 83  Triglycerides <150 mg/dL 136 - - - 122  Creatinine 0.60 - 0.88 mg/dL 0.96(H) 0.97 0.89(H) - 0.92(H)   BP/Weight 11/17/2018 10/11/2018 08/11/2018 06/22/2018 05/27/2018 01/18/2018 [id number]  Systolic BP 123456 123456 123456 [id number] 0000000 0000000 [id number]  Diastolic BP 82 82 82 70 73 73 70  Wt. (Lbs) 141 141 141 141.8 134 134.6 137  BMI 28.48 28.48 28.48 28.64 27.06 27.19 27.67   Foot/eye exam completion dates Latest Ref Rng & Units 11/29/2017 08/16/2017  Eye Exam No Retinopathy No Retinopathy -  Foot Form Completion - - Done      Updated lab needed at/ before next visit. Foot exam overdyue also , needs in office appt

## 2018-11-20 NOTE — Assessment & Plan Note (Signed)
Followed closely by Rheumatology amd overall doing well

## 2018-11-21 ENCOUNTER — Telehealth: Payer: Self-pay | Admitting: *Deleted

## 2018-11-21 DIAGNOSIS — H35363 Drusen (degenerative) of macula, bilateral: Secondary | ICD-10-CM | POA: Diagnosis not present

## 2018-11-21 NOTE — Telephone Encounter (Signed)
Pt calling for covid results; negative, verbalizes understanding. 

## 2018-11-21 NOTE — Telephone Encounter (Signed)
Last Visit: 06/22/18 Next Visit: 11/23/18 Labs: 09/27/18 CBC WNL.Calcium is elevated-11.1  Okay to refill per Dr. Estanislado Pandy

## 2018-11-23 ENCOUNTER — Encounter: Payer: Self-pay | Admitting: Rheumatology

## 2018-11-23 ENCOUNTER — Ambulatory Visit (INDEPENDENT_AMBULATORY_CARE_PROVIDER_SITE_OTHER): Payer: Medicare Other | Admitting: Rheumatology

## 2018-11-23 ENCOUNTER — Ambulatory Visit (INDEPENDENT_AMBULATORY_CARE_PROVIDER_SITE_OTHER): Payer: Medicare Other

## 2018-11-23 ENCOUNTER — Other Ambulatory Visit: Payer: Self-pay | Admitting: Family Medicine

## 2018-11-23 ENCOUNTER — Other Ambulatory Visit: Payer: Self-pay

## 2018-11-23 VITALS — BP 140/71 | HR 62 | Resp 13 | Ht 59.0 in | Wt 133.0 lb

## 2018-11-23 DIAGNOSIS — M19071 Primary osteoarthritis, right ankle and foot: Secondary | ICD-10-CM

## 2018-11-23 DIAGNOSIS — M19012 Primary osteoarthritis, left shoulder: Secondary | ICD-10-CM

## 2018-11-23 DIAGNOSIS — M25561 Pain in right knee: Secondary | ICD-10-CM

## 2018-11-23 DIAGNOSIS — M19011 Primary osteoarthritis, right shoulder: Secondary | ICD-10-CM | POA: Diagnosis not present

## 2018-11-23 DIAGNOSIS — M19041 Primary osteoarthritis, right hand: Secondary | ICD-10-CM | POA: Diagnosis not present

## 2018-11-23 DIAGNOSIS — G8929 Other chronic pain: Secondary | ICD-10-CM | POA: Diagnosis not present

## 2018-11-23 DIAGNOSIS — M0579 Rheumatoid arthritis with rheumatoid factor of multiple sites without organ or systems involvement: Secondary | ICD-10-CM

## 2018-11-23 DIAGNOSIS — M170 Bilateral primary osteoarthritis of knee: Secondary | ICD-10-CM

## 2018-11-23 DIAGNOSIS — M19072 Primary osteoarthritis, left ankle and foot: Secondary | ICD-10-CM

## 2018-11-23 DIAGNOSIS — M47816 Spondylosis without myelopathy or radiculopathy, lumbar region: Secondary | ICD-10-CM | POA: Diagnosis not present

## 2018-11-23 DIAGNOSIS — Z79899 Other long term (current) drug therapy: Secondary | ICD-10-CM | POA: Diagnosis not present

## 2018-11-23 DIAGNOSIS — I10 Essential (primary) hypertension: Secondary | ICD-10-CM | POA: Diagnosis not present

## 2018-11-23 DIAGNOSIS — M810 Age-related osteoporosis without current pathological fracture: Secondary | ICD-10-CM | POA: Diagnosis not present

## 2018-11-23 DIAGNOSIS — E119 Type 2 diabetes mellitus without complications: Secondary | ICD-10-CM

## 2018-11-23 DIAGNOSIS — B356 Tinea cruris: Secondary | ICD-10-CM | POA: Diagnosis not present

## 2018-11-23 DIAGNOSIS — M19042 Primary osteoarthritis, left hand: Secondary | ICD-10-CM

## 2018-11-23 DIAGNOSIS — R011 Cardiac murmur, unspecified: Secondary | ICD-10-CM | POA: Diagnosis not present

## 2018-11-23 NOTE — Telephone Encounter (Signed)
Has not been taking anything for her cholesterol. States she wasn't aware she should have been. Grandaughter wants a call back when something is called in so she can go pick it up. Please advise   (labs mailed)

## 2018-11-23 NOTE — Telephone Encounter (Signed)
Called patient and left message for them to return call at the office   

## 2018-11-23 NOTE — Addendum Note (Signed)
Addended by: Eual Fines on: 11/23/2018 04:49 PM   Modules accepted: Orders

## 2018-11-23 NOTE — Progress Notes (Unsigned)
crestor 5  

## 2018-11-24 NOTE — Telephone Encounter (Signed)
I spoke with her grand daughter, crestor 5 mg dosage has been clarified, and Ms Parden does have the medication

## 2018-12-05 DIAGNOSIS — E119 Type 2 diabetes mellitus without complications: Secondary | ICD-10-CM | POA: Diagnosis not present

## 2018-12-06 LAB — HEMOGLOBIN A1C
Hgb A1c MFr Bld: 6.3 % of total Hgb — ABNORMAL HIGH (ref <5.7)
Mean Plasma Glucose: 134 (calc)
eAG (mmol/L): 7.4 (calc)

## 2018-12-07 ENCOUNTER — Other Ambulatory Visit: Payer: Self-pay

## 2018-12-07 ENCOUNTER — Ambulatory Visit (INDEPENDENT_AMBULATORY_CARE_PROVIDER_SITE_OTHER): Payer: Medicare Other | Admitting: Family Medicine

## 2018-12-07 ENCOUNTER — Encounter: Payer: Self-pay | Admitting: Family Medicine

## 2018-12-07 VITALS — BP 138/70 | HR 57 | Temp 98.3°F | Resp 15 | Ht 59.0 in | Wt 136.0 lb

## 2018-12-07 DIAGNOSIS — K429 Umbilical hernia without obstruction or gangrene: Secondary | ICD-10-CM

## 2018-12-07 DIAGNOSIS — E785 Hyperlipidemia, unspecified: Secondary | ICD-10-CM | POA: Diagnosis not present

## 2018-12-07 DIAGNOSIS — E118 Type 2 diabetes mellitus with unspecified complications: Secondary | ICD-10-CM

## 2018-12-07 DIAGNOSIS — E559 Vitamin D deficiency, unspecified: Secondary | ICD-10-CM

## 2018-12-07 DIAGNOSIS — R102 Pelvic and perineal pain: Secondary | ICD-10-CM | POA: Diagnosis not present

## 2018-12-07 DIAGNOSIS — E119 Type 2 diabetes mellitus without complications: Secondary | ICD-10-CM

## 2018-12-07 DIAGNOSIS — E663 Overweight: Secondary | ICD-10-CM

## 2018-12-07 DIAGNOSIS — L602 Onychogryphosis: Secondary | ICD-10-CM

## 2018-12-07 DIAGNOSIS — I10 Essential (primary) hypertension: Secondary | ICD-10-CM

## 2018-12-07 NOTE — Patient Instructions (Addendum)
Follow-up in office with MD in 5 months call if you need be before.  You are referred to podiatry for toenail and foot care  Blood pressure today is very good continue same medication.  Congratulations on controlling blood sugar  by diet this has improved.  The hernia that you have is not medical emergency and I do not recommend surgery unless you start developing pain in the area.  Since you complain of pelvic pain and  I do know that you have an enlarged womb I am referring you for pelvic ultrasound to take a look at your pelvic organs.  Microalbumin from office today.  Fasting lipid CMP and EGFR hemoglobin A1c and vitamin D in 5 months 1 week before next visit. Thanks for choosing [facility], we consider it a privelige to serve you.  

## 2018-12-08 ENCOUNTER — Other Ambulatory Visit (HOSPITAL_COMMUNITY)
Admission: RE | Admit: 2018-12-08 | Discharge: 2018-12-08 | Disposition: A | Discharge status: Home / Self Care | Payer: Medicare Other | Source: Ambulatory Visit | Attending: Family Medicine | Admitting: Family Medicine

## 2018-12-08 DIAGNOSIS — E119 Type 2 diabetes mellitus without complications: Secondary | ICD-10-CM | POA: Insufficient documentation

## 2018-12-09 LAB — MICROALBUMIN / CREATININE URINE RATIO
Creatinine, Urine: 159.9 mg/dL
Microalb Creat Ratio: 32 mg/g creat — ABNORMAL HIGH (ref 0–29)
Microalb, Ur: 51.9 ug/mL — ABNORMAL HIGH

## 2018-12-10 ENCOUNTER — Encounter: Payer: Self-pay | Admitting: Family Medicine

## 2018-12-10 DIAGNOSIS — K429 Umbilical hernia without obstruction or gangrene: Secondary | ICD-10-CM | POA: Insufficient documentation

## 2018-12-10 DIAGNOSIS — R102 Pelvic and perineal pain: Secondary | ICD-10-CM | POA: Insufficient documentation

## 2018-12-10 NOTE — Assessment & Plan Note (Signed)
Controlled, no change in medication DASH diet and commitment to daily physical activity for a minimum of 30 minutes discussed and encouraged, as a part of hypertension management. The importance of attaining a healthy weight is also discussed.  BP/Weight 12/07/2018 11/23/2018 11/17/2018 10/11/2018 08/11/2018 06/22/2018 [id number]  Systolic BP 0000000 [id number] 123456 123456 123456 [id number] 0000000  Diastolic BP 70 71 82 82 82 70 73  Wt. (Lbs) 136 133 141 141 141 141.8 134  BMI 27.47 26.86 28.48 28.48 28.48 28.64 27.06

## 2018-12-10 NOTE — Assessment & Plan Note (Signed)
  Patient re-educated about  the importance of commitment to a  minimum of 150 minutes of exercise per week as able.  The importance of healthy food choices with portion control discussed, as well as eating regularly and within a 12 hour window most days. The need to choose "clean , green" food 50 to 75% of the time is discussed, as well as to make water the primary drink and set a goal of 64 ounces water daily.    Weight /BMI 12/07/2018 11/23/2018 11/17/2018  WEIGHT 136 lb 133 lb 141 lb  HEIGHT 4\' 11"  4\' 11"  4\' 11"   BMI 27.47 kg/m2 26.86 kg/m2 28.48 kg/m2

## 2018-12-10 NOTE — Assessment & Plan Note (Signed)
Easily reducible umbilical hernia, pt reassured that  Surgery is no indicated at this time

## 2018-12-10 NOTE — Progress Notes (Signed)
MALORIE DOTY     MRN: [id number]      DOB: 1932/02/10   HPI Ms. Serafin is here for follow up and re-evaluation of chronic medical conditions, medication management and review of any available recent lab and radiology data.  Preventive health is updated, specifically  Cancer screening and Immunization.   Questions or concerns regarding consultations or procedures which the PT has had in the interim are  addressed. The PT denies any adverse reactions to current medications since the last visit.  C/o hernia and enlarged  Abdomen, wants to know if she needs to do anything about the hernia, denies pain, nausea or vomit C/o pelvic pain, denies vaginal discharge or bleeding  Follows low carb diet to control blood sugar with success  ROS Denies recent fever or chills. Denies sinus pressure, nasal congestion, ear pain or sore throat. Denies chest congestion, productive cough or wheezing. Denies chest pains, palpitations and leg swelling Denies , nausea, vomiting,diarrhea or constipation.   Denies dysuria, frequency, hesitancy or incontinence. Denies uncontrolled joint pain, swelling and limitation in mobility. Denies headaches, seizures, numbness, or tingling. Denies depression, anxiety or insomnia. Denies skin break down or rash.   PE  BP 138/70   Pulse (!) 57   Temp 98.3 F (36.8 C) (Temporal)   Resp 15   Ht 4\' 11"  (1.499 m)   Wt 136 lb (61.7 kg)   SpO2 96%   BMI 27.47 kg/m   Patient alert and oriented and in no cardiopulmonary distress.  HEENT: No facial asymmetry, EOMI,     Neck supple .  Chest: Clear to auscultation bilaterally.  CVS: S1, S2 no murmurs, no S3.Regular rate.  ABD: Soft, reproducible umbilical hernia, no guarding or rebound, no palpable organomegaly, normal bowel sounds Generalized tenderness on palpation of lower abdominal/pelvic area.   Ext: No edema  MS: Adequate though reduced  ROM spine, shoulders, hips and knees.  Skin: Intact, no ulcerations or  rash noted.  Psych: Good eye contact, normal affect. Memory intact not anxious or depressed appearing.  CNS: CN 2-12 intact, power,  normal throughout.no focal deficits noted.   Assessment & Plan  Essential hypertension, benign Controlled, no change in medication DASH diet and commitment to daily physical activity for a minimum of 30 minutes discussed and encouraged, as a part of hypertension management. The importance of attaining a healthy weight is also discussed.  BP/Weight 12/07/2018 11/23/2018 11/17/2018 10/11/2018 08/11/2018 06/22/2018 [id number]  Systolic BP 0000000 [id number] 123456 123456 123456 [id number] 0000000  Diastolic BP 70 71 82 82 82 70 73  Wt. (Lbs) 136 133 141 141 141 141.8 134  BMI 27.47 26.86 28.48 28.48 28.48 28.64 27.06       Type 2 diabetes mellitus with complication, without long-term current use of insulin ([place]) Ms. Krocker is reminded of the importance of commitment to daily physical activity for 30 minutes or more, as able and the need to limit carbohydrate intake to 30 to 60 grams per meal to help with blood sugar control.   Diet controlled , continue same, pt applauded on this   Ms. Erhart is reminded of the importance of daily foot exam, annual eye examination, and good blood sugar, blood pressure and cholesterol control.  Diabetic Labs Latest Ref Rng & Units 12/08/2018 12/05/2018 11/04/2018 09/27/2018 06/22/2018  HbA1c <5.7 % of total Hgb - 6.3(H) - - -  Microalbumin Not Estab. ug/mL 51.9(H) - - - -  Micro/Creat Ratio 0 - 29 mg/g creat 32(H) - - - -  Chol <200 mg/dL - - 194 - -  HDL > OR = 50 mg/dL - - 49(L) - -  Calc LDL mg/dL (calc) - - 120(H) - -  Triglycerides <150 mg/dL - - 136 - -  Creatinine 0.60 - 0.88 mg/dL - - 0.96(H) 0.97 0.89(H)   BP/Weight 12/07/2018 11/23/2018 11/17/2018 10/11/2018 08/11/2018 06/22/2018 [id number]  Systolic BP 0000000 [id number] 123456 123456 123456 [id number] 0000000  Diastolic BP 70 71 82 82 82 70 73  Wt. (Lbs) 136 133 141 141 141 141.8 134  BMI 27.47 26.86 28.48 28.48 28.48 28.64 27.06    Foot/eye exam completion dates Latest Ref Rng & Units 12/07/2018 11/29/2017  Eye Exam No Retinopathy - No Retinopathy  Foot Form Completion - Done -        Overweight (BMI 25.0-29.9)  Patient re-educated about  the importance of commitment to a  minimum of 150 minutes of exercise per week as able.  The importance of healthy food choices with portion control discussed, as well as eating regularly and within a 12 hour window most days. The need to choose "clean , green" food 50 to 75% of the time is discussed, as well as to make water the primary drink and set a goal of 64 ounces water daily.    Weight /BMI 12/07/2018 11/23/2018 11/17/2018  WEIGHT 136 lb 133 lb 141 lb  HEIGHT 4\' 11"  4\' 11"  4\' 11"   BMI 27.47 kg/m2 26.86 kg/m2 123456 kg/m2      Umbilical hernia without obstruction and without gangrene Easily reducible umbilical hernia, pt reassured that  Surgery is no indicated at this time  Pelvic pain in female C/o increased pelvic pain in past several months.has a large fibroid. Refer for iomaging, trans abdominal exam, tender to palpation in pelvis

## 2018-12-10 NOTE — Assessment & Plan Note (Signed)
C/o increased pelvic pain in past several months.has a large fibroid. Refer for iomaging, trans abdominal exam, tender to palpation in pelvis

## 2018-12-10 NOTE — Assessment & Plan Note (Signed)
Stacy Meyers is reminded of the importance of commitment to daily physical activity for 30 minutes or more, as able and the need to limit carbohydrate intake to 30 to 60 grams per meal to help with blood sugar control.   Diet controlled , continue same, pt applauded on this   Stacy Meyers is reminded of the importance of daily foot exam, annual eye examination, and good blood sugar, blood pressure and cholesterol control.  Diabetic Labs Latest Ref Rng & Units 12/08/2018 12/05/2018 11/04/2018 09/27/2018 06/22/2018  HbA1c <5.7 % of total Hgb - 6.3(H) - - -  Microalbumin Not Estab. ug/mL 51.9(H) - - - -  Micro/Creat Ratio 0 - 29 mg/g creat 32(H) - - - -  Chol <200 mg/dL - - 194 - -  HDL > OR = 50 mg/dL - - 49(L) - -  Calc LDL mg/dL (calc) - - 120(H) - -  Triglycerides <150 mg/dL - - 136 - -  Creatinine 0.60 - 0.88 mg/dL - - 0.96(H) 0.97 0.89(H)   BP/Weight 12/07/2018 11/23/2018 11/17/2018 10/11/2018 08/11/2018 06/22/2018 [id number]  Systolic BP 0000000 [id number] 123456 123456 123456 [id number] 0000000  Diastolic BP 70 71 82 82 82 70 73  Wt. (Lbs) 136 133 141 141 141 141.8 134  BMI 27.47 26.86 28.48 28.48 28.48 28.64 27.06   Foot/eye exam completion dates Latest Ref Rng & Units 12/07/2018 11/29/2017  Eye Exam No Retinopathy - No Retinopathy  Foot Form Completion - Done -

## 2018-12-20 ENCOUNTER — Ambulatory Visit (HOSPITAL_COMMUNITY): Admission: RE | Admit: 2018-12-20 | Payer: Medicare Other | Source: Ambulatory Visit

## 2018-12-21 ENCOUNTER — Ambulatory Visit (INDEPENDENT_AMBULATORY_CARE_PROVIDER_SITE_OTHER): Payer: Medicare Other | Admitting: Podiatry

## 2018-12-21 ENCOUNTER — Other Ambulatory Visit: Payer: Self-pay

## 2018-12-21 DIAGNOSIS — E0843 Diabetes mellitus due to underlying condition with diabetic autonomic (poly)neuropathy: Secondary | ICD-10-CM

## 2018-12-21 DIAGNOSIS — M79676 Pain in unspecified toe(s): Secondary | ICD-10-CM | POA: Diagnosis not present

## 2018-12-21 DIAGNOSIS — B351 Tinea unguium: Secondary | ICD-10-CM | POA: Diagnosis not present

## 2018-12-26 NOTE — Progress Notes (Signed)
   SUBJECTIVE Patient with a history of diabetes mellitus presents to office today complaining of elongated, thickened nails that cause pain while ambulating in shoes. She is unable to trim her own nails. Patient is here for further evaluation and treatment.   Past Medical History:  Diagnosis Date  . Acute cholecystitis 12/16/2010  . Anemia 12/14/2010  . ARF (acute renal failure) ([place]) 12/14/2010  . Bradycardia    Sinus - noted 6/11  . Cholelithiasis 12/14/2010  . Collagen vascular disease ([place])   . Diastolic dysfunction [id number]   Grade 1 per 2-D echocardiogram  . Dysrhythmia   . Essential hypertension, benign   . HTN (hypertension) 12/14/2010  . Hypercalcemia 12/14/2010  . Hypokalemia 12/14/2010  . Mixed hyperlipidemia   . Osteoarthritis   . Rheumatoid arthritis(714.0)     OBJECTIVE General Patient is awake, alert, and oriented x 3 and in no acute distress. Derm Skin is dry and supple bilateral. Negative open lesions or macerations. Remaining integument unremarkable. Nails are tender, long, thickened and dystrophic with subungual debris, consistent with onychomycosis, 1-5 bilateral. No signs of infection noted. Vasc  DP and PT pedal pulses palpable bilaterally. Temperature gradient within normal limits.  Neuro Epicritic and protective threshold sensation diminished bilaterally.  Musculoskeletal Exam No symptomatic pedal deformities noted bilateral. Muscular strength within normal limits.  ASSESSMENT 1. Diabetes Mellitus w/ peripheral neuropathy 2. Onychomycosis of nail due to dermatophyte bilateral 3. Pain in foot bilateral  PLAN OF CARE 1. Patient evaluated today. 2. Instructed to maintain good pedal hygiene and foot care. Stressed importance of controlling blood sugar.  3. Mechanical debridement of nails 1-5 bilaterally performed using a nail nipper. Filed with dremel without incident.  4. Return to clinic in 3 mos.     Edrick Kins, DPM [facility]  Dr. Edrick Kins, [address]                                        [place], [place] [address]                Office [phone]  Fax [phone]

## 2018-12-27 ENCOUNTER — Other Ambulatory Visit: Payer: Self-pay | Admitting: Rheumatology

## 2018-12-28 NOTE — Telephone Encounter (Signed)
Last Visit: 11/23/2018 Next Visit: 04/26/2019  Okay to refill per Dr. Estanislado Pandy.

## 2019-01-16 ENCOUNTER — Other Ambulatory Visit: Payer: Self-pay | Admitting: Family Medicine

## 2019-01-16 ENCOUNTER — Telehealth: Payer: Self-pay | Admitting: Rheumatology

## 2019-01-16 NOTE — Telephone Encounter (Signed)
Patient called requesting a return call to let her know if it is okay for her to have COVID vaccine due to the medications she is taking.

## 2019-01-16 NOTE — Telephone Encounter (Signed)
Attempted to contact patient and unable to reach patient. No answer nor voicemail available.

## 2019-01-17 NOTE — Telephone Encounter (Signed)
Attempted to contact patient and left message on machine to advise patient to call the office.  

## 2019-02-01 DIAGNOSIS — E119 Type 2 diabetes mellitus without complications: Secondary | ICD-10-CM | POA: Diagnosis not present

## 2019-02-02 LAB — MICROALBUMIN / CREATININE URINE RATIO
Creatinine, Urine: 20 mg/dL (ref 20–275)
Microalb Creat Ratio: 135 mcg/mg creat — ABNORMAL HIGH (ref <30)
Microalb, Ur: 2.7 mg/dL

## 2019-02-06 ENCOUNTER — Telehealth: Payer: Self-pay | Admitting: *Deleted

## 2019-02-06 ENCOUNTER — Other Ambulatory Visit: Payer: Self-pay | Admitting: Family Medicine

## 2019-02-06 LAB — POCT URINALYSIS DIP (CLINITEK)
Glucose, UA: NEGATIVE mg/dL
Nitrite, UA: POSITIVE — AB
POC PROTEIN,UA: 30 — AB
Spec Grav, UA: 1.025 (ref 1.010–1.025)
Urobilinogen, UA: 0.2 E.U./dL
pH, UA: 5.5 (ref 5.0–8.0)

## 2019-02-06 MED ORDER — AMPICILLIN 250 MG PO CAPS: 250.0000 mg | ORAL_CAPSULE | Freq: Three times a day (TID) | ORAL | 0 refills | Status: DC

## 2019-02-06 NOTE — Telephone Encounter (Signed)
-----   Message from Fayrene Helper, MD sent at 02/06/2019  4:30 PM EST ----- Pls let her know 5 days of antibiotic is prescribed, still keep appt tomorrow, push fluids, start the ampicillin today

## 2019-02-06 NOTE — Telephone Encounter (Signed)
Called pt let her know antibiotic had been sent to way st Dr Moshe Cipro wanted her to start today and drink plenty of fluids and let her know to keep her appt tomorrow over the phone

## 2019-02-07 ENCOUNTER — Other Ambulatory Visit: Payer: Self-pay

## 2019-02-07 ENCOUNTER — Other Ambulatory Visit (HOSPITAL_COMMUNITY)
Admission: RE | Admit: 2019-02-07 | Discharge: 2019-02-07 | Disposition: A | Discharge status: Home / Self Care | Payer: Medicare HMO | Source: Ambulatory Visit | Attending: Family Medicine | Admitting: Family Medicine

## 2019-02-07 ENCOUNTER — Ambulatory Visit (INDEPENDENT_AMBULATORY_CARE_PROVIDER_SITE_OTHER): Payer: Medicare HMO | Admitting: Family Medicine

## 2019-02-07 VITALS — BP 138/70 | Ht 59.0 in | Wt 142.0 lb

## 2019-02-07 DIAGNOSIS — N3001 Acute cystitis with hematuria: Secondary | ICD-10-CM | POA: Diagnosis not present

## 2019-02-07 DIAGNOSIS — M0579 Rheumatoid arthritis with rheumatoid factor of multiple sites without organ or systems involvement: Secondary | ICD-10-CM

## 2019-02-07 DIAGNOSIS — I10 Essential (primary) hypertension: Secondary | ICD-10-CM | POA: Diagnosis not present

## 2019-02-07 NOTE — Progress Notes (Signed)
Virtual Visit via Telephone Note  I connected with Stacy Meyers on 02/07/19 at  1:40 PM EST by telephone and verified that I am speaking with the correct person using two identifiers.  Location: Patient: home Provider: office   I discussed the limitations, risks, security and privacy concerns of performing an evaluation and management service by telephone and the availability of in person appointments. I also discussed with the patient that there may be a patient responsible charge related to this service. The patient expressed understanding and agreed to proceed.   History of Present Illness: Malodorous urine and frequency x 4 days . Denies fever or chgills, has intermittent mild flankl pain. Denies recent fever or chills. Denies sinus pressure, nasal congestion, ear pain or sore throat. Denies chest congestion, productive cough or wheezing. Denies chest pains, palpitations and leg swelling Denies headaches, seizures, numbness, or tingling. Denies depression, anxiety or insomnia. Denies skin break down or rash.       Observations/Objective: BP 138/70   Ht 4\' 11"  (1.499 m)   Wt 142 lb (64.4 kg)   BMI 28.68 kg/m  Good communication with no confusion and intact memory. Alert and oriented x 3 No signs of respiratory distress during speech '   Assessment and Plan: Acute cystitis with hematuria Symptomatic, 5 day course of antibiotic prescribed  Essential hypertension, benign Controlled, no change in medication   Rheumatoid arthritis with rheumatoid factor of multiple sites without organ or systems involvement ([place]) Controlled and managed by rheumatology    Follow Up Instructions:    I discussed the assessment and treatment plan with the patient. The patient was provided an opportunity to ask questions and all were answered. The patient agreed with the plan and demonstrated an understanding of the instructions.   The patient was advised to call back or seek an  in-person evaluation if the symptoms worsen or if the condition fails to improve as anticipated.  I provided 10 minutes of non-face-to-face time during this encounter.   [name], MD

## 2019-02-07 NOTE — Patient Instructions (Addendum)
F/U in May as before , call if you need me sooner  Ampicilin is at your pharmacy for your bladder infection. Please drink at least 60 ounces water daily  Be Careful not to fall, and keep home safe  Pre medicate with tylenol the day before and the day of your 2nd Covid vaccine  Thanks for choosing [facility], we consider it a privelige to serve you.

## 2019-02-08 ENCOUNTER — Encounter: Payer: Self-pay | Admitting: Family Medicine

## 2019-02-08 NOTE — Assessment & Plan Note (Signed)
Controlled and managed by rheumatology

## 2019-02-08 NOTE — Assessment & Plan Note (Signed)
Controlled, no change in medication  

## 2019-02-08 NOTE — Assessment & Plan Note (Signed)
Symptomatic, 5 day course of antibiotic prescribed

## 2019-02-09 ENCOUNTER — Other Ambulatory Visit: Payer: Self-pay | Admitting: Family Medicine

## 2019-02-09 ENCOUNTER — Telehealth: Payer: Self-pay

## 2019-02-09 ENCOUNTER — Telehealth: Payer: Self-pay | Admitting: *Deleted

## 2019-02-09 LAB — URINE CULTURE: Culture: >=100000 — AB

## 2019-02-09 MED ORDER — AMPICILLIN 500 MG PO CAPS: 500.0000 mg | ORAL_CAPSULE | Freq: Three times a day (TID) | ORAL | 0 refills | Status: AC

## 2019-02-09 NOTE — Telephone Encounter (Signed)
Patient is having trouble getting the ampicillin. Said the way it was written, would have to be ordered.. I stayed on hold with [facility] for 20 mins trying to get it sorted out but nobody would answer.  DO you want to change the med to something else? Patient is calling worried and needing the medication

## 2019-02-09 NOTE — Telephone Encounter (Signed)
Granddaughter is calling in as PT cant get her medication due to the way it was written, and she said it was on order --please  Call her

## 2019-02-09 NOTE — Telephone Encounter (Signed)
-----   Message from Fayrene Helper, MD sent at 02/09/2019  8:09 AM EST ----- Treated with ampicillin, let family/ pt know antibiotic she was prescribed will treat infection please

## 2019-02-09 NOTE — Telephone Encounter (Signed)
I spoke directly with the pharmacist , verified medication is available at 500 mg dose and the script will be filled today, I also notified the grand daughter

## 2019-02-09 NOTE — Telephone Encounter (Signed)
Spoke with pt . Pt said the pharmacy was out of medication but they talked like they would get this in today. Let her know if they hadnt received it by 4pm today to let me know and we would be happy to figure out whats going on so she could get this today. Also let her know if her granddaughter who is on DPR had any questions she could call back and I would gladly speak with her.

## 2019-02-21 ENCOUNTER — Other Ambulatory Visit: Payer: Self-pay | Admitting: Rheumatology

## 2019-02-21 NOTE — Telephone Encounter (Signed)
Last Visit: 11/23/2018 Next Visit: 04/26/2019 Labs: 11/04/2018 creat 0.96 09/27/2018 CBC WNL.Calcium is elevated-11.1.   Advised patient she is due to update labs, patient will update "as soon as the weather clears up". Patient will call prior to going to have orders released.   Okay to refill 30 day supply, per Dr. Estanislado Pandy.

## 2019-02-22 ENCOUNTER — Other Ambulatory Visit: Payer: Self-pay | Admitting: Family Medicine

## 2019-02-22 DIAGNOSIS — E559 Vitamin D deficiency, unspecified: Secondary | ICD-10-CM

## 2019-02-22 DIAGNOSIS — R7989 Other specified abnormal findings of blood chemistry: Secondary | ICD-10-CM

## 2019-02-22 DIAGNOSIS — E785 Hyperlipidemia, unspecified: Secondary | ICD-10-CM

## 2019-02-22 DIAGNOSIS — M0579 Rheumatoid arthritis with rheumatoid factor of multiple sites without organ or systems involvement: Secondary | ICD-10-CM

## 2019-02-22 DIAGNOSIS — E669 Obesity, unspecified: Secondary | ICD-10-CM

## 2019-02-22 DIAGNOSIS — I10 Essential (primary) hypertension: Secondary | ICD-10-CM

## 2019-02-22 DIAGNOSIS — E1169 Type 2 diabetes mellitus with other specified complication: Secondary | ICD-10-CM

## 2019-02-27 ENCOUNTER — Telehealth: Payer: Self-pay | Admitting: Rheumatology

## 2019-02-27 DIAGNOSIS — Z79899 Other long term (current) drug therapy: Secondary | ICD-10-CM

## 2019-02-27 NOTE — Telephone Encounter (Signed)
Lab orders released.  

## 2019-02-27 NOTE — Telephone Encounter (Signed)
Patient called requesting her labwork orders be sent to [facility] on [address] in [place].  Patient states she will be going in the next 2-3 days.

## 2019-03-02 DIAGNOSIS — E559 Vitamin D deficiency, unspecified: Secondary | ICD-10-CM | POA: Diagnosis not present

## 2019-03-02 DIAGNOSIS — E119 Type 2 diabetes mellitus without complications: Secondary | ICD-10-CM | POA: Diagnosis not present

## 2019-03-02 DIAGNOSIS — E785 Hyperlipidemia, unspecified: Secondary | ICD-10-CM | POA: Diagnosis not present

## 2019-03-02 DIAGNOSIS — Z79899 Other long term (current) drug therapy: Secondary | ICD-10-CM | POA: Diagnosis not present

## 2019-03-02 LAB — COMPLETE METABOLIC PANEL WITH GFR
AG Ratio: 1.8 (calc) (ref 1.0–2.5)
ALT: 9 U/L (ref 6–29)
AST: 13 U/L (ref 10–35)
Albumin: 4.1 g/dL (ref 3.6–5.1)
Alkaline phosphatase (APISO): 79 U/L (ref 37–153)
BUN: 21 mg/dL (ref 7–25)
CO2: 26 mmol/L (ref 20–32)
Calcium: 10.2 mg/dL (ref 8.6–10.4)
Chloride: 107 mmol/L (ref 98–110)
Creat: 0.86 mg/dL (ref 0.60–0.88)
GFR, Est African American: 71 mL/min/{1.73_m2} (ref >=60)
GFR, Est Non African American: 62 mL/min/{1.73_m2} (ref >=60)
Globulin: 2.3 g/dL (calc) (ref 1.9–3.7)
Glucose, Bld: 99 mg/dL (ref 65–139)
Potassium: 3.8 mmol/L (ref 3.5–5.3)
Sodium: 141 mmol/L (ref 135–146)
Total Bilirubin: 0.5 mg/dL (ref 0.2–1.2)
Total Protein: 6.4 g/dL (ref 6.1–8.1)

## 2019-03-02 LAB — CBC WITH DIFFERENTIAL/PLATELET
Absolute Monocytes: 708 cells/uL (ref 200–950)
Basophils Absolute: 29 cells/uL (ref 0–200)
Basophils Relative: 0.5 %
Eosinophils Absolute: 70 cells/uL (ref 15–500)
Eosinophils Relative: 1.2 %
HCT: 37.3 % (ref 35.0–45.0)
Hemoglobin: 12.7 g/dL (ref 11.7–15.5)
Lymphs Abs: 1514 cells/uL (ref 850–3900)
MCH: 32.1 pg (ref 27.0–33.0)
MCHC: 34 g/dL (ref 32.0–36.0)
MCV: 94.2 fL (ref 80.0–100.0)
MPV: 9.1 fL (ref 7.5–12.5)
Monocytes Relative: 12.2 %
Neutro Abs: 3480 cells/uL (ref 1500–7800)
Neutrophils Relative %: 60 %
Platelets: 248 10*3/uL (ref 140–400)
RBC: 3.96 10*6/uL (ref 3.80–5.10)
RDW: 13.7 % (ref 11.0–15.0)
Total Lymphocyte: 26.1 %
WBC: 5.8 10*3/uL (ref 3.8–10.8)

## 2019-03-03 LAB — HEMOGLOBIN A1C
Hgb A1c MFr Bld: 6.3 % of total Hgb — ABNORMAL HIGH (ref <5.7)
Mean Plasma Glucose: 134 (calc)
eAG (mmol/L): 7.4 (calc)

## 2019-03-03 LAB — LIPID PANEL
Cholesterol: 193 mg/dL (ref <200)
HDL: 50 mg/dL (ref >=50)
LDL Cholesterol (Calc): 124 mg/dL (calc) — ABNORMAL HIGH
Non-HDL Cholesterol (Calc): 143 mg/dL (calc) — ABNORMAL HIGH (ref <130)
Total CHOL/HDL Ratio: 3.9 (calc) (ref <5.0)
Triglycerides: 93 mg/dL (ref <150)

## 2019-03-03 LAB — VITAMIN D 25 HYDROXY (VIT D DEFICIENCY, FRACTURES): Vit D, 25-Hydroxy: 45 ng/mL (ref 30–100)

## 2019-03-21 ENCOUNTER — Other Ambulatory Visit: Payer: Self-pay | Admitting: Rheumatology

## 2019-03-21 NOTE — Telephone Encounter (Signed)
Last Visit: 11/23/2018 Next Visit: 04/26/2019 Labs: 03/02/19 CBC and CMP WNL  Dose per last office note on 11/23/18: Methotrexate 3 tablets every 7 days  Okay to refill per Dr. Estanislado Pandy

## 2019-03-22 ENCOUNTER — Ambulatory Visit: Payer: Medicare Other | Admitting: Podiatry

## 2019-03-26 ENCOUNTER — Other Ambulatory Visit: Payer: Self-pay | Admitting: Rheumatology

## 2019-03-27 NOTE — Telephone Encounter (Signed)
Last Visit: 11/23/2018 Next Visit: 04/26/2019  Current Dose per office note on 11/23/18: prednisone 4 mg by mouth daily.  Okay to refill per Dr. Estanislado Pandy

## 2019-03-29 ENCOUNTER — Other Ambulatory Visit: Payer: Self-pay | Admitting: Family Medicine

## 2019-03-31 DIAGNOSIS — R32 Unspecified urinary incontinence: Secondary | ICD-10-CM | POA: Diagnosis not present

## 2019-03-31 DIAGNOSIS — Z79899 Other long term (current) drug therapy: Secondary | ICD-10-CM | POA: Diagnosis not present

## 2019-03-31 DIAGNOSIS — I10 Essential (primary) hypertension: Secondary | ICD-10-CM | POA: Diagnosis not present

## 2019-03-31 DIAGNOSIS — Z7983 Long term (current) use of bisphosphonates: Secondary | ICD-10-CM | POA: Diagnosis not present

## 2019-03-31 DIAGNOSIS — M069 Rheumatoid arthritis, unspecified: Secondary | ICD-10-CM | POA: Diagnosis not present

## 2019-03-31 DIAGNOSIS — R269 Unspecified abnormalities of gait and mobility: Secondary | ICD-10-CM | POA: Diagnosis not present

## 2019-03-31 DIAGNOSIS — M810 Age-related osteoporosis without current pathological fracture: Secondary | ICD-10-CM | POA: Diagnosis not present

## 2019-03-31 DIAGNOSIS — Z7952 Long term (current) use of systemic steroids: Secondary | ICD-10-CM | POA: Diagnosis not present

## 2019-03-31 DIAGNOSIS — B009 Herpesviral infection, unspecified: Secondary | ICD-10-CM | POA: Diagnosis not present

## 2019-03-31 DIAGNOSIS — J302 Other seasonal allergic rhinitis: Secondary | ICD-10-CM | POA: Diagnosis not present

## 2019-04-03 ENCOUNTER — Inpatient Hospital Stay (HOSPITAL_COMMUNITY): Payer: Medicare HMO | Admitting: Certified Registered Nurse Anesthetist

## 2019-04-03 ENCOUNTER — Inpatient Hospital Stay (HOSPITAL_COMMUNITY): Payer: Medicare HMO

## 2019-04-03 ENCOUNTER — Emergency Department (HOSPITAL_COMMUNITY): Payer: Medicare HMO

## 2019-04-03 ENCOUNTER — Other Ambulatory Visit: Payer: Self-pay

## 2019-04-03 ENCOUNTER — Encounter (HOSPITAL_COMMUNITY)
Admission: EM | Disposition: A | Discharge status: Home Health | Payer: Self-pay | Source: Home / Self Care | Attending: Urology

## 2019-04-03 ENCOUNTER — Ambulatory Visit: Admit: 2019-04-03 | Payer: Medicare HMO | Admitting: Urology

## 2019-04-03 ENCOUNTER — Inpatient Hospital Stay (HOSPITAL_COMMUNITY)
Admission: EM | Admit: 2019-04-03 | Discharge: 2019-04-06 | DRG: 854 | Disposition: A | Discharge status: Home Health | Payer: Medicare HMO | Attending: Family Medicine | Admitting: Family Medicine

## 2019-04-03 ENCOUNTER — Encounter (HOSPITAL_COMMUNITY): Payer: Self-pay | Admitting: Emergency Medicine

## 2019-04-03 DIAGNOSIS — A419 Sepsis, unspecified organism: Secondary | ICD-10-CM | POA: Diagnosis not present

## 2019-04-03 DIAGNOSIS — I5032 Chronic diastolic (congestive) heart failure: Secondary | ICD-10-CM | POA: Diagnosis not present

## 2019-04-03 DIAGNOSIS — R05 Cough: Secondary | ICD-10-CM | POA: Diagnosis not present

## 2019-04-03 DIAGNOSIS — E872 Acidosis: Secondary | ICD-10-CM | POA: Diagnosis present

## 2019-04-03 DIAGNOSIS — R7881 Bacteremia: Secondary | ICD-10-CM | POA: Diagnosis not present

## 2019-04-03 DIAGNOSIS — N3281 Overactive bladder: Secondary | ICD-10-CM | POA: Diagnosis present

## 2019-04-03 DIAGNOSIS — I2510 Atherosclerotic heart disease of native coronary artery without angina pectoris: Secondary | ICD-10-CM | POA: Diagnosis present

## 2019-04-03 DIAGNOSIS — N3001 Acute cystitis with hematuria: Secondary | ICD-10-CM | POA: Diagnosis not present

## 2019-04-03 DIAGNOSIS — R062 Wheezing: Secondary | ICD-10-CM

## 2019-04-03 DIAGNOSIS — N201 Calculus of ureter: Secondary | ICD-10-CM

## 2019-04-03 DIAGNOSIS — M0579 Rheumatoid arthritis with rheumatoid factor of multiple sites without organ or systems involvement: Secondary | ICD-10-CM | POA: Diagnosis present

## 2019-04-03 DIAGNOSIS — I959 Hypotension, unspecified: Secondary | ICD-10-CM

## 2019-04-03 DIAGNOSIS — K429 Umbilical hernia without obstruction or gangrene: Secondary | ICD-10-CM | POA: Diagnosis present

## 2019-04-03 DIAGNOSIS — E119 Type 2 diabetes mellitus without complications: Secondary | ICD-10-CM | POA: Diagnosis not present

## 2019-04-03 DIAGNOSIS — A4151 Sepsis due to Escherichia coli [E. coli]: Secondary | ICD-10-CM | POA: Diagnosis not present

## 2019-04-03 DIAGNOSIS — B9620 Unspecified Escherichia coli [E. coli] as the cause of diseases classified elsewhere: Secondary | ICD-10-CM | POA: Diagnosis not present

## 2019-04-03 DIAGNOSIS — Z79899 Other long term (current) drug therapy: Secondary | ICD-10-CM

## 2019-04-03 DIAGNOSIS — M810 Age-related osteoporosis without current pathological fracture: Secondary | ICD-10-CM | POA: Diagnosis present

## 2019-04-03 DIAGNOSIS — Z886 Allergy status to analgesic agent status: Secondary | ICD-10-CM

## 2019-04-03 DIAGNOSIS — Z8261 Family history of arthritis: Secondary | ICD-10-CM

## 2019-04-03 DIAGNOSIS — E861 Hypovolemia: Secondary | ICD-10-CM | POA: Diagnosis not present

## 2019-04-03 DIAGNOSIS — B009 Herpesviral infection, unspecified: Secondary | ICD-10-CM | POA: Diagnosis present

## 2019-04-03 DIAGNOSIS — N200 Calculus of kidney: Secondary | ICD-10-CM

## 2019-04-03 DIAGNOSIS — N139 Obstructive and reflux uropathy, unspecified: Secondary | ICD-10-CM | POA: Diagnosis not present

## 2019-04-03 DIAGNOSIS — Z7722 Contact with and (suspected) exposure to environmental tobacco smoke (acute) (chronic): Secondary | ICD-10-CM | POA: Diagnosis present

## 2019-04-03 DIAGNOSIS — N3000 Acute cystitis without hematuria: Secondary | ICD-10-CM | POA: Diagnosis not present

## 2019-04-03 DIAGNOSIS — Z833 Family history of diabetes mellitus: Secondary | ICD-10-CM | POA: Diagnosis not present

## 2019-04-03 DIAGNOSIS — N390 Urinary tract infection, site not specified: Secondary | ICD-10-CM | POA: Diagnosis not present

## 2019-04-03 DIAGNOSIS — Z888 Allergy status to other drugs, medicaments and biological substances status: Secondary | ICD-10-CM

## 2019-04-03 DIAGNOSIS — I110 Hypertensive heart disease with heart failure: Secondary | ICD-10-CM | POA: Diagnosis present

## 2019-04-03 DIAGNOSIS — E782 Mixed hyperlipidemia: Secondary | ICD-10-CM | POA: Diagnosis not present

## 2019-04-03 DIAGNOSIS — J9811 Atelectasis: Secondary | ICD-10-CM | POA: Diagnosis not present

## 2019-04-03 DIAGNOSIS — R1111 Vomiting without nausea: Secondary | ICD-10-CM | POA: Diagnosis not present

## 2019-04-03 DIAGNOSIS — N179 Acute kidney failure, unspecified: Secondary | ICD-10-CM | POA: Diagnosis present

## 2019-04-03 DIAGNOSIS — Z7983 Long term (current) use of bisphosphonates: Secondary | ICD-10-CM

## 2019-04-03 DIAGNOSIS — R110 Nausea: Secondary | ICD-10-CM | POA: Diagnosis not present

## 2019-04-03 DIAGNOSIS — Z841 Family history of disorders of kidney and ureter: Secondary | ICD-10-CM

## 2019-04-03 DIAGNOSIS — Z9049 Acquired absence of other specified parts of digestive tract: Secondary | ICD-10-CM | POA: Diagnosis not present

## 2019-04-03 DIAGNOSIS — N136 Pyonephrosis: Secondary | ICD-10-CM | POA: Diagnosis present

## 2019-04-03 DIAGNOSIS — E860 Dehydration: Secondary | ICD-10-CM | POA: Diagnosis present

## 2019-04-03 DIAGNOSIS — I998 Other disorder of circulatory system: Secondary | ICD-10-CM | POA: Diagnosis not present

## 2019-04-03 DIAGNOSIS — Z20822 Contact with and (suspected) exposure to covid-19: Secondary | ICD-10-CM | POA: Diagnosis not present

## 2019-04-03 DIAGNOSIS — R059 Cough, unspecified: Secondary | ICD-10-CM

## 2019-04-03 DIAGNOSIS — Z7952 Long term (current) use of systemic steroids: Secondary | ICD-10-CM

## 2019-04-03 DIAGNOSIS — R112 Nausea with vomiting, unspecified: Secondary | ICD-10-CM | POA: Diagnosis not present

## 2019-04-03 DIAGNOSIS — I10 Essential (primary) hypertension: Secondary | ICD-10-CM | POA: Diagnosis not present

## 2019-04-03 DIAGNOSIS — R0902 Hypoxemia: Secondary | ICD-10-CM | POA: Diagnosis not present

## 2019-04-03 DIAGNOSIS — D1779 Benign lipomatous neoplasm of other sites: Secondary | ICD-10-CM | POA: Diagnosis present

## 2019-04-03 DIAGNOSIS — N132 Hydronephrosis with renal and ureteral calculous obstruction: Secondary | ICD-10-CM | POA: Diagnosis not present

## 2019-04-03 HISTORY — DX: Sepsis, unspecified organism: A41.9

## 2019-04-03 HISTORY — DX: Calculus of kidney: N20.0

## 2019-04-03 HISTORY — PX: CYSTOSCOPY WITH STENT PLACEMENT: SHX5790

## 2019-04-03 LAB — CBC WITH DIFFERENTIAL/PLATELET
Abs Immature Granulocytes: 0.3 10*3/uL — ABNORMAL HIGH (ref 0.00–0.07)
Basophils Absolute: 0 10*3/uL (ref 0.0–0.1)
Basophils Relative: 0 %
Eosinophils Absolute: 0 10*3/uL (ref 0.0–0.5)
Eosinophils Relative: 0 %
HCT: 32.8 % — ABNORMAL LOW (ref 36.0–46.0)
Hemoglobin: 10.3 g/dL — ABNORMAL LOW (ref 12.0–15.0)
Immature Granulocytes: 2 %
Lymphocytes Relative: 4 %
Lymphs Abs: 0.5 10*3/uL — ABNORMAL LOW (ref 0.7–4.0)
MCH: 31.8 pg (ref 26.0–34.0)
MCHC: 31.4 g/dL (ref 30.0–36.0)
MCV: 101.2 fL — ABNORMAL HIGH (ref 80.0–100.0)
Monocytes Absolute: 0.3 10*3/uL (ref 0.1–1.0)
Monocytes Relative: 2 %
Neutro Abs: 12.4 10*3/uL — ABNORMAL HIGH (ref 1.7–7.7)
Neutrophils Relative %: 92 %
Platelets: 129 10*3/uL — ABNORMAL LOW (ref 150–400)
RBC: 3.24 MIL/uL — ABNORMAL LOW (ref 3.87–5.11)
RDW: 14.2 % (ref 11.5–15.5)
WBC: 13.5 10*3/uL — ABNORMAL HIGH (ref 4.0–10.5)
nRBC: 0 % (ref 0.0–0.2)

## 2019-04-03 LAB — GLUCOSE, CAPILLARY
Glucose-Capillary: 117 mg/dL — ABNORMAL HIGH (ref 70–99)
Glucose-Capillary: 112 mg/dL — ABNORMAL HIGH (ref 70–99)
Glucose-Capillary: 125 mg/dL — ABNORMAL HIGH (ref 70–99)
Glucose-Capillary: 142 mg/dL — ABNORMAL HIGH (ref 70–99)

## 2019-04-03 LAB — URINALYSIS, ROUTINE W REFLEX MICROSCOPIC
Bilirubin Urine: NEGATIVE
Glucose, UA: NEGATIVE mg/dL
Ketones, ur: NEGATIVE mg/dL
Nitrite: NEGATIVE
Protein, ur: 100 mg/dL — AB
Specific Gravity, Urine: 1.017 (ref 1.005–1.030)
WBC, UA: >50 WBC/hpf — ABNORMAL HIGH (ref 0–5)
pH: 5 (ref 5.0–8.0)

## 2019-04-03 LAB — PROTIME-INR
INR: 1.5 — ABNORMAL HIGH (ref 0.8–1.2)
Prothrombin Time: 18.1 seconds — ABNORMAL HIGH (ref 11.4–15.2)

## 2019-04-03 LAB — COMPREHENSIVE METABOLIC PANEL
ALT: 13 U/L (ref 0–44)
AST: 21 U/L (ref 15–41)
Albumin: 2.6 g/dL — ABNORMAL LOW (ref 3.5–5.0)
Alkaline Phosphatase: 83 U/L (ref 38–126)
Anion gap: 9 (ref 5–15)
BUN: 31 mg/dL — ABNORMAL HIGH (ref 8–23)
CO2: 22 mmol/L (ref 22–32)
Calcium: 8.3 mg/dL — ABNORMAL LOW (ref 8.9–10.3)
Chloride: 108 mmol/L (ref 98–111)
Creatinine, Ser: 1.86 mg/dL — ABNORMAL HIGH (ref 0.44–1.00)
GFR calc Af Amer: 28 mL/min — ABNORMAL LOW (ref >60)
GFR calc non Af Amer: 24 mL/min — ABNORMAL LOW (ref >60)
Glucose, Bld: 141 mg/dL — ABNORMAL HIGH (ref 70–99)
Potassium: 3.1 mmol/L — ABNORMAL LOW (ref 3.5–5.1)
Sodium: 139 mmol/L (ref 135–145)
Total Bilirubin: 0.8 mg/dL (ref 0.3–1.2)
Total Protein: 5.1 g/dL — ABNORMAL LOW (ref 6.5–8.1)

## 2019-04-03 LAB — RESPIRATORY PANEL BY RT PCR (FLU A&B, COVID)
Influenza A by PCR: NEGATIVE
Influenza B by PCR: NEGATIVE
SARS Coronavirus 2 by RT PCR: NEGATIVE

## 2019-04-03 LAB — MAGNESIUM: Magnesium: 1.5 mg/dL — ABNORMAL LOW (ref 1.7–2.4)

## 2019-04-03 LAB — LACTIC ACID, PLASMA
Lactic Acid, Venous: 4.2 mmol/L (ref 0.5–1.9)
Lactic Acid, Venous: 0.7 mmol/L (ref 0.5–1.9)

## 2019-04-03 SURGERY — CYSTOSCOPY, WITH STENT INSERTION
Anesthesia: General | Laterality: Right

## 2019-04-03 MED ORDER — ONDANSETRON HCL 4 MG/2ML IJ SOLN: 4.0000 mg | Freq: Four times a day (QID) | INTRAMUSCULAR | Status: DC | PRN

## 2019-04-03 MED ORDER — ROSUVASTATIN CALCIUM 5 MG PO TABS
5.0000 mg | ORAL_TABLET | ORAL | Status: DC
  Administered 2019-04-04 – 2019-04-06 (×2): 5 mg via ORAL
  Filled 2019-04-03 (×2): qty 1

## 2019-04-03 MED ORDER — PIPERACILLIN-TAZOBACTAM IN DEX 2-0.25 GM/50ML IV SOLN
2.2500 g | Freq: Three times a day (TID) | INTRAVENOUS | Status: DC
  Administered 2019-04-03 (×2): 2.25 g via INTRAVENOUS
  Filled 2019-04-03 (×3): qty 50

## 2019-04-03 MED ORDER — SODIUM CHLORIDE 0.9 % IV BOLUS
500.0000 mL | Freq: Once | INTRAVENOUS | Status: AC
  Administered 2019-04-03: 500 mL via INTRAVENOUS

## 2019-04-03 MED ORDER — EPHEDRINE SULFATE-NACL 50-0.9 MG/10ML-% IV SOSY
PREFILLED_SYRINGE | INTRAVENOUS | Status: DC | PRN
  Administered 2019-04-03: 10 mg via INTRAVENOUS

## 2019-04-03 MED ORDER — PROPOFOL 10 MG/ML IV BOLUS
INTRAVENOUS | Status: AC
  Filled 2019-04-03: qty 20

## 2019-04-03 MED ORDER — MAGNESIUM SULFATE 4 GM/100ML IV SOLN
4.0000 g | Freq: Once | INTRAVENOUS | Status: AC
  Administered 2019-04-03: 4 g via INTRAVENOUS
  Filled 2019-04-03: qty 100

## 2019-04-03 MED ORDER — SODIUM CHLORIDE 0.9 % IV SOLN: Freq: Once | INTRAVENOUS | Status: AC

## 2019-04-03 MED ORDER — SODIUM CHLORIDE 0.9 % IV SOLN: INTRAVENOUS | Status: DC

## 2019-04-03 MED ORDER — INSULIN ASPART 100 UNIT/ML ~~LOC~~ SOLN: 0.0000 [IU] | Freq: Every day | SUBCUTANEOUS | Status: DC

## 2019-04-03 MED ORDER — DEXAMETHASONE SODIUM PHOSPHATE 10 MG/ML IJ SOLN
INTRAMUSCULAR | Status: AC
  Filled 2019-04-03: qty 1

## 2019-04-03 MED ORDER — ACYCLOVIR 400 MG PO TABS
400.0000 mg | ORAL_TABLET | Freq: Two times a day (BID) | ORAL | Status: DC
  Administered 2019-04-03 – 2019-04-06 (×6): 400 mg via ORAL
  Filled 2019-04-03 (×6): qty 1

## 2019-04-03 MED ORDER — PHENYLEPHRINE 40 MCG/ML (10ML) SYRINGE FOR IV PUSH (FOR BLOOD PRESSURE SUPPORT)
PREFILLED_SYRINGE | INTRAVENOUS | Status: AC
  Filled 2019-04-03: qty 10

## 2019-04-03 MED ORDER — ONDANSETRON HCL 4 MG/2ML IJ SOLN
INTRAMUSCULAR | Status: DC | PRN
  Administered 2019-04-03: 4 mg via INTRAVENOUS

## 2019-04-03 MED ORDER — FOLIC ACID 1 MG PO TABS
1.0000 mg | ORAL_TABLET | Freq: Every day | ORAL | Status: DC
  Administered 2019-04-03 – 2019-04-06 (×4): 1 mg via ORAL
  Filled 2019-04-03 (×4): qty 1

## 2019-04-03 MED ORDER — SODIUM CHLORIDE 0.9% FLUSH: 3.0000 mL | INTRAVENOUS | Status: DC | PRN

## 2019-04-03 MED ORDER — SODIUM CHLORIDE 0.9 % IV BOLUS: 500.0000 mL | Freq: Once | INTRAVENOUS | Status: AC

## 2019-04-03 MED ORDER — TRAZODONE HCL 50 MG PO TABS
50.0000 mg | ORAL_TABLET | Freq: Every evening | ORAL | Status: DC | PRN
  Administered 2019-04-05: 50 mg via ORAL
  Filled 2019-04-03: qty 1

## 2019-04-03 MED ORDER — FENTANYL CITRATE (PF) 100 MCG/2ML IJ SOLN
INTRAMUSCULAR | Status: AC
  Filled 2019-04-03: qty 2

## 2019-04-03 MED ORDER — SODIUM CHLORIDE 0.9 % IV BOLUS
1000.0000 mL | Freq: Once | INTRAVENOUS | Status: AC
  Administered 2019-04-03: 1000 mL via INTRAVENOUS

## 2019-04-03 MED ORDER — FENTANYL CITRATE (PF) 100 MCG/2ML IJ SOLN: 25.0000 ug | INTRAMUSCULAR | Status: DC | PRN

## 2019-04-03 MED ORDER — PHENYLEPHRINE HCL (PRESSORS) 10 MG/ML IV SOLN
INTRAVENOUS | Status: AC
  Filled 2019-04-03: qty 1

## 2019-04-03 MED ORDER — INSULIN ASPART 100 UNIT/ML ~~LOC~~ SOLN: 0.0000 [IU] | Freq: Three times a day (TID) | SUBCUTANEOUS | Status: DC

## 2019-04-03 MED ORDER — SODIUM CHLORIDE 0.9% FLUSH
3.0000 mL | Freq: Two times a day (BID) | INTRAVENOUS | Status: DC
  Administered 2019-04-05: 3 mL via INTRAVENOUS

## 2019-04-03 MED ORDER — POLYETHYLENE GLYCOL 3350 17 G PO PACK: 17.0000 g | PACK | Freq: Every day | ORAL | Status: DC | PRN

## 2019-04-03 MED ORDER — STERILE WATER FOR IRRIGATION IR SOLN
Status: DC | PRN
  Administered 2019-04-03: 3000 mL

## 2019-04-03 MED ORDER — SUCCINYLCHOLINE CHLORIDE 200 MG/10ML IV SOSY
PREFILLED_SYRINGE | INTRAVENOUS | Status: DC | PRN
  Administered 2019-04-03: 100 mg via INTRAVENOUS

## 2019-04-03 MED ORDER — POTASSIUM CHLORIDE CRYS ER 20 MEQ PO TBCR
20.0000 meq | EXTENDED_RELEASE_TABLET | Freq: Every day | ORAL | Status: DC
  Administered 2019-04-03 – 2019-04-06 (×4): 20 meq via ORAL
  Filled 2019-04-03 (×4): qty 1

## 2019-04-03 MED ORDER — PHENYLEPHRINE 40 MCG/ML (10ML) SYRINGE FOR IV PUSH (FOR BLOOD PRESSURE SUPPORT)
PREFILLED_SYRINGE | INTRAVENOUS | Status: DC | PRN
  Administered 2019-04-03 (×2): 120 ug via INTRAVENOUS

## 2019-04-03 MED ORDER — FENTANYL CITRATE (PF) 100 MCG/2ML IJ SOLN
INTRAMUSCULAR | Status: DC | PRN
  Administered 2019-04-03: 50 ug via INTRAVENOUS

## 2019-04-03 MED ORDER — 0.9 % SODIUM CHLORIDE (POUR BTL) OPTIME
TOPICAL | Status: DC | PRN
  Administered 2019-04-03: 1000 mL

## 2019-04-03 MED ORDER — ACETAMINOPHEN 500 MG PO TABS
1000.0000 mg | ORAL_TABLET | Freq: Once | ORAL | Status: AC
  Administered 2019-04-03: 1000 mg via ORAL
  Filled 2019-04-03: qty 2

## 2019-04-03 MED ORDER — PIPERACILLIN-TAZOBACTAM 3.375 G IVPB 30 MIN
3.3750 g | Freq: Once | INTRAVENOUS | Status: AC
  Administered 2019-04-03: 3.375 g via INTRAVENOUS
  Filled 2019-04-03: qty 50

## 2019-04-03 MED ORDER — LIDOCAINE 2% (20 MG/ML) 5 ML SYRINGE
INTRAMUSCULAR | Status: DC | PRN
  Administered 2019-04-03: 40 mg via INTRAVENOUS

## 2019-04-03 MED ORDER — SODIUM CHLORIDE 0.9 % IV BOLUS: 1000.0000 mL | Freq: Once | INTRAVENOUS | Status: DC

## 2019-04-03 MED ORDER — ACETAMINOPHEN 325 MG PO TABS
650.0000 mg | ORAL_TABLET | Freq: Four times a day (QID) | ORAL | Status: DC | PRN
  Administered 2019-04-05 – 2019-04-06 (×2): 650 mg via ORAL
  Filled 2019-04-03 (×2): qty 2

## 2019-04-03 MED ORDER — IOHEXOL 300 MG/ML  SOLN
INTRAMUSCULAR | Status: DC | PRN
  Administered 2019-04-03: 10 mL

## 2019-04-03 MED ORDER — MONTELUKAST SODIUM 10 MG PO TABS
5.0000 mg | ORAL_TABLET | Freq: Every day | ORAL | Status: DC
  Administered 2019-04-03 – 2019-04-05 (×3): 5 mg via ORAL
  Filled 2019-04-03 (×3): qty 1

## 2019-04-03 MED ORDER — SODIUM CHLORIDE 0.9 % IV SOLN
250.0000 mL | INTRAVENOUS | Status: DC | PRN
  Administered 2019-04-05: 250 mL via INTRAVENOUS

## 2019-04-03 MED ORDER — LIDOCAINE 2% (20 MG/ML) 5 ML SYRINGE
INTRAMUSCULAR | Status: AC
  Filled 2019-04-03: qty 5

## 2019-04-03 MED ORDER — ALBUMIN HUMAN 5 % IV SOLN
INTRAVENOUS | Status: AC
  Filled 2019-04-03: qty 250

## 2019-04-03 MED ORDER — POTASSIUM CHLORIDE 2 MEQ/ML IV SOLN
INTRAVENOUS | Status: AC
  Filled 2019-04-03 (×3): qty 1000

## 2019-04-03 MED ORDER — SODIUM CHLORIDE 0.9 % IV SOLN: 1.0000 g | Freq: Once | INTRAVENOUS | Status: DC

## 2019-04-03 MED ORDER — PHENYLEPHRINE HCL-NACL 10-0.9 MG/250ML-% IV SOLN
INTRAVENOUS | Status: DC | PRN
  Administered 2019-04-03: 80 ug/min via INTRAVENOUS

## 2019-04-03 MED ORDER — ALBUMIN HUMAN 5 % IV SOLN: INTRAVENOUS | Status: DC | PRN

## 2019-04-03 MED ORDER — LACTATED RINGERS IV SOLN: INTRAVENOUS | Status: DC | PRN

## 2019-04-03 MED ORDER — DEXAMETHASONE SODIUM PHOSPHATE 10 MG/ML IJ SOLN
INTRAMUSCULAR | Status: DC | PRN
  Administered 2019-04-03: 4 mg via INTRAVENOUS

## 2019-04-03 MED ORDER — ONDANSETRON HCL 4 MG PO TABS: 4.0000 mg | ORAL_TABLET | Freq: Four times a day (QID) | ORAL | Status: DC | PRN

## 2019-04-03 MED ORDER — POTASSIUM CHLORIDE 10 MEQ/100ML IV SOLN
10.0000 meq | INTRAVENOUS | Status: AC
  Administered 2019-04-03 (×4): 10 meq via INTRAVENOUS
  Filled 2019-04-03 (×4): qty 100

## 2019-04-03 MED ORDER — PREDNISONE 10 MG PO TABS
10.0000 mg | ORAL_TABLET | Freq: Every day | ORAL | Status: DC
  Administered 2019-04-04 – 2019-04-06 (×3): 10 mg via ORAL
  Filled 2019-04-03 (×2): qty 1
  Filled 2019-04-03 (×3): qty 2
  Filled 2019-04-03: qty 1

## 2019-04-03 MED ORDER — PROPOFOL 10 MG/ML IV BOLUS
INTRAVENOUS | Status: DC | PRN
  Administered 2019-04-03: 80 mg via INTRAVENOUS

## 2019-04-03 MED ORDER — ACETAMINOPHEN 650 MG RE SUPP: 650.0000 mg | Freq: Four times a day (QID) | RECTAL | Status: DC | PRN

## 2019-04-03 MED ORDER — ONDANSETRON HCL 4 MG/2ML IJ SOLN
INTRAMUSCULAR | Status: AC
  Filled 2019-04-03: qty 2

## 2019-04-03 SURGICAL SUPPLY — 14 items
BAG URO CATCHER STRL LF (MISCELLANEOUS) ×3 IMPLANT
CATH INTERMIT  6FR 70CM (CATHETERS) ×1 IMPLANT
CLOTH BEACON ORANGE TIMEOUT ST (SAFETY) ×1 IMPLANT
GLOVE BIO SURGEON STRL SZ 6 (GLOVE) ×3 IMPLANT
GOWN STRL REUS W/ TWL LRG LVL3 (GOWN DISPOSABLE) ×1 IMPLANT
GOWN STRL REUS W/TWL LRG LVL3 (GOWN DISPOSABLE) ×9 IMPLANT
GUIDEWIRE STR DUAL SENSOR (WIRE) ×3 IMPLANT
KIT TURNOVER KIT A (KITS) IMPLANT
MANIFOLD NEPTUNE II (INSTRUMENTS) ×3 IMPLANT
PACK CYSTO (CUSTOM PROCEDURE TRAY) ×3 IMPLANT
STENT URET 6FRX24 CONTOUR (STENTS) ×2 IMPLANT
TUBING CONNECTING 10 (TUBING) ×2 IMPLANT
TUBING CONNECTING 10' (TUBING) ×1
TUBING UROLOGY SET (TUBING) IMPLANT

## 2019-04-03 NOTE — ED Provider Notes (Signed)
[facility] EMERGENCY DEPARTMENT Provider Note   CSN: [id number] Arrival date & time: 04/03/19  0507     History Chief Complaint  Patient presents with   Emesis    Stacy BROSSEAU is a 86 y.o. female.  Patient with history of renal failure, anemia, high blood pressure, rheumatoid arthritis presents with nausea vomiting diarrhea that started on Saturday evening.  Patient feels generally weak.  Patient's blood pressure was 88/50 for EMS and IV fluids were started.  Patient currently has no abdominal pain and only mild nausea.  No blood in the stools.  No sick contacts known.  No fever at home.        Past Medical History:  Diagnosis Date   Acute cholecystitis 12/16/2010   Anemia 12/14/2010   ARF (acute renal failure) ([place]) 12/14/2010   Bradycardia    Sinus - noted 6/11   Cholelithiasis 12/14/2010   Collagen vascular disease ([place])    Diastolic dysfunction [id number]   Grade 1 per 2-D echocardiogram   Dysrhythmia    Essential hypertension, benign    HTN (hypertension) 12/14/2010   Hypercalcemia 12/14/2010   Hypokalemia 12/14/2010   Mixed hyperlipidemia    Osteoarthritis    Rheumatoid arthritis(714.0)     Patient Active Problem List   Diagnosis Date Noted   Umbilical hernia without obstruction and without gangrene 12/10/2018   Pelvic pain in female 12/10/2018   Cough with exposure to COVID-19 virus 11/20/2018   Type 2 diabetes mellitus with complication, without long-term current use of insulin ([place]) 11/20/2018   Fibroids 05/08/2016   Spondylosis of lumbar region without myelopathy or radiculopathy 12/24/2015   Solitary bone cyst of right shoulder 05/28/2015   Right knee pain 05/28/2015   Osteoarthritis of both shoulders 05/28/2015   Overweight (BMI 25.0-29.9) 04/28/2014   Ganglion cyst of wrist 99991111   Systolic murmur 123456   Acute cystitis with hematuria [id number]   Diastolic dysfunction [id number]   Seasonal allergies  08/28/2010   Type 2 diabetes, diet controlled ([place]) 07/25/2009   Herpes simplex virus (HSV) infection 09/16/2008   Rheumatoid arthritis with rheumatoid factor of multiple sites without organ or systems involvement ([place]) 09/16/2008   Hyperlipidemia LDL goal <100 05/23/2008   Essential hypertension, benign 05/23/2008    Past Surgical History:  Procedure Laterality Date   Bilateral cataract surgery  2009   BUNIONECTOMY Right 2012   CHOLECYSTECTOMY  12/17/2010   Procedure: LAPAROSCOPIC CHOLECYSTECTOMY;  Surgeon: Donato Heinz;  Location: [facility] ORS;  Service: General;  Laterality: N/A;   COLONOSCOPY  05/22/2011   Procedure: COLONOSCOPY;  Surgeon: Rogene Houston, MD;  Location: [facility] ENDO SUITE;  Service: Endoscopy;  Laterality: N/A;  1045   EYE SURGERY Bilateral    cataract   GANGLION CYST EXCISION Left 06/02/2012   Procedure: Excision ganglion left dorsal wrist;  Surgeon: Sanjuana Kava, MD;  Location: [facility] ORS;  Service: Orthopedics;  Laterality: Left;   L2-L3 discectomy  2002   L3-L4, L4-L5 lumbar decompression  2011   Right shoulder cyst excision  2010   SPINE SURGERY     twice, approx 2006, 2011     OB History   No obstetric history on file.     Family History  Problem Relation Age of Onset   Stroke Mother    Cancer Father    Diabetes Sister    Diabetes Sister    Kidney failure Brother    Kidney failure Brother    Pancreatic cancer Sister    Kidney Stones Sister  Only one Kidney   Arthritis Sister    Heart disease Daughter     Social History   Tobacco Use   Smoking status: Never Smoker   Smokeless tobacco: Never Used   Tobacco comment: Second hand smoke   Substance Use Topics   Alcohol use: No   Drug use: Never    Home Medications Prior to Admission medications   Medication Sig Start Date End Date Taking? Authorizing Provider  acetaminophen (TYLENOL) 500 MG tablet Take 1,000 mg by mouth every 6 (six) hours as needed. Reported on  05/28/2015    [provider]  acyclovir (ZOVIRAX) 400 MG tablet TAKE 1 TABLET BY MOUTH TWICE A DAY 09/20/18   Fayrene Helper, MD  alendronate (FOSAMAX) 70 MG tablet TAKE 1 TABLET BY MOUTH EVERY 7 DAYS. TAKE WITH A FULL GLASS OF WATER ON AN EMPTY STOMACH. 01/16/19   Fayrene Helper, MD  amLODipine (NORVASC) 5 MG tablet TAKE 1 TABLET BY MOUTH EVERY DAY 02/22/19   Fayrene Helper, MD  azelastine (ASTELIN) 0.1 % nasal spray Place 2 sprays into both nostrils 2 (two) times daily. Use in each nostril as directed Patient taking differently: Place 2 sprays into both nostrils as needed. Use in each nostril as directed 12/05/15   Fayrene Helper, MD  folic acid (FOLVITE) 1 MG tablet TAKE 1 TABLET BY MOUTH EVERY DAY 05/27/18   Deveshwar, Abel Presto, MD  KLOR-CON M20 20 MEQ tablet TAKE 1 TABLET (20 MEQ TOTAL) BY MOUTH DAILY. 03/29/19   Perlie Mayo, NP  methotrexate (RHEUMATREX) 2.5 MG tablet TAKE 3 TABLETS BY MOUTH ONCE A WEEK. TAKE AS DIRECTED CAUTION:CHEMOTHERAPY. PROTECT FROM LIGHT 03/21/19   Bo Merino, MD  montelukast (SINGULAIR) 10 MG tablet TAKE 1 TABLET BY MOUTH EVERYDAY AT BEDTIME 02/09/19   Fayrene Helper, MD  predniSONE (DELTASONE) 1 MG tablet TAKE 4 TABLETS (4 MG TOTAL) BY MOUTH DAILY WITH BREAKFAST. 03/27/19   Bo Merino, MD  rosuvastatin (CRESTOR) 5 MG tablet TAKE 1 TABLET BY MOUTH EVERY OTHER DAY 02/22/19   Fayrene Helper, MD  UNABLE TO FIND Diabetic shoes x 1 Inserts x 3  DX E11.9 11/09/16   Fayrene Helper, MD  UNABLE TO FIND Diabetic shoes x 1 inserts x 3  Dx e11.9 08/20/17   Fayrene Helper, MD    Allergies    Aspirin and Sudafed [pseudoephedrine hcl]  Review of Systems   Review of Systems  Constitutional: Positive for appetite change and fatigue. Negative for chills and fever.  HENT: Negative for congestion.   Eyes: Negative for visual disturbance.  Respiratory: Negative for shortness of breath.   Cardiovascular: Negative for chest  pain.  Gastrointestinal: Positive for abdominal pain, nausea and vomiting.  Genitourinary: Negative for dysuria and flank pain.  Musculoskeletal: Negative for back pain, neck pain and neck stiffness.  Skin: Negative for rash.  Neurological: Negative for light-headedness and headaches.    Physical Exam Updated Vital Signs BP (!) 87/48    Pulse 70    Temp 98.6 F (37 C) (Oral)    Resp (!) 31    Ht 4\' 11"  (1.499 m)    Wt 63.5 kg    SpO2 95%    BMI 28.28 kg/m   Physical Exam Vitals and nursing note reviewed.  Constitutional:      Appearance: She is well-developed.  HENT:     Head: Normocephalic and atraumatic.     Mouth/Throat:     Mouth: Mucous membranes are  dry.  Eyes:     General:        Right eye: No discharge.        Left eye: No discharge.     Conjunctiva/sclera: Conjunctivae normal.  Neck:     Trachea: No tracheal deviation.  Cardiovascular:     Rate and Rhythm: Normal rate and regular rhythm.  Pulmonary:     Effort: Pulmonary effort is normal.     Breath sounds: Normal breath sounds.  Abdominal:     General: There is no distension.     Palpations: Abdomen is soft.     Tenderness: There is no abdominal tenderness. There is no guarding.  Musculoskeletal:     Cervical back: Normal range of motion and neck supple.  Skin:    General: Skin is warm.     Findings: No rash.  Neurological:     Mental Status: She is alert and oriented to person, place, and time.  Psychiatric:     Comments: Fatigue appearance     ED Results / Procedures / Treatments   Labs (all labs ordered are listed, but only abnormal results are displayed) Labs Reviewed  COMPREHENSIVE METABOLIC PANEL - Abnormal; Notable for the following components:      Result Value   Potassium 3.1 (*)    Glucose, Bld 141 (*)    BUN 31 (*)    Creatinine, Ser 1.86 (*)    Calcium 8.3 (*)    Total Protein 5.1 (*)    Albumin 2.6 (*)    GFR calc non Af Amer 24 (*)    GFR calc Af Amer 28 (*)    All other  components within normal limits  CBC WITH DIFFERENTIAL/PLATELET - Abnormal; Notable for the following components:   WBC 13.5 (*)    RBC 3.24 (*)    Hemoglobin 10.3 (*)    HCT 32.8 (*)    MCV 101.2 (*)    Platelets 129 (*)    Neutro Abs 12.4 (*)    Lymphs Abs 0.5 (*)    Abs Immature Granulocytes 0.30 (*)    All other components within normal limits  LACTIC ACID, PLASMA - Abnormal; Notable for the following components:   Lactic Acid, Venous 4.2 (*)    All other components within normal limits  URINE CULTURE  CULTURE, BLOOD (ROUTINE X 2)  CULTURE, BLOOD (ROUTINE X 2)  RESPIRATORY PANEL BY RT PCR (FLU A&B, COVID)  URINALYSIS, ROUTINE W REFLEX MICROSCOPIC  LACTIC ACID, PLASMA  PROTIME-INR    EKG EKG Interpretation  Date/Time:  Monday April 03 2019 05:37:55 EDT Ventricular Rate:  91 PR Interval:    QRS Duration: 88 QT Interval:  369 QTC Calculation: 454 R Axis:   35 Text Interpretation: Sinus rhythm Atrial premature complexes Borderline T abnormalities, diffuse leads overall similar previous Confirmed by Elnora Morrison [phone]) on 04/03/2019 6:15:05 AM   Radiology DG Chest Port 1 View  Result Date: 04/03/2019 CLINICAL DATA:  Nausea, vomiting and diarrhea. EXAM: PORTABLE CHEST 1 VIEW COMPARISON:  06/17/2015 FINDINGS: The cardiac silhouette, mediastinal and hilar contours are within normal limits and stable given the AP projection and portable technique. Stable mild tortuosity and calcification of the thoracic aorta. Low lung volumes with streaky basilar atelectasis. Mild eventration of the right hemidiaphragm. The bony thorax is intact. Stable advanced degenerative changes involving both shoulders. IMPRESSION: Low lung volumes with streaky bibasilar atelectasis. No infiltrates, edema or effusions. Electronically Signed   By: Marijo Sanes M.D.   On: 04/03/2019  07:09    Procedures .Critical Care Performed by: Elnora Morrison, MD Authorized by: Elnora Morrison, MD   Critical care  provider statement:    Critical care time (minutes):  75   Critical care start time:  04/03/2019 6:00 AM   Critical care end time:  04/03/2019 7:15 AM   Critical care time was exclusive of:  Separately billable procedures and treating other patients and teaching time   Critical care was necessary to treat or prevent imminent or life-threatening deterioration of the following conditions:  Sepsis   Critical care was time spent personally by me on the following activities:  Evaluation of patient's response to treatment, examination of patient, ordering and performing treatments and interventions, ordering and review of laboratory studies, ordering and review of radiographic studies, pulse oximetry, re-evaluation of patient's condition and review of old charts   (including critical care time)  Medications Ordered in ED Medications  piperacillin-tazobactam (ZOSYN) IVPB 3.375 g (has no administration in time range)  sodium chloride 0.9 % bolus 500 mL (500 mLs Intravenous Given by EMS 04/03/19 0524)  sodium chloride 0.9 % bolus 500 mL (0 mLs Intravenous Stopped 04/03/19 0629)  sodium chloride 0.9 % bolus 1,000 mL (1,000 mLs Intravenous New Bag/Given 04/03/19 [id number])    ED Course  I have reviewed the triage vital signs and the nursing notes.  Pertinent labs & imaging results that were available during my care of the patient were reviewed by me and considered in my medical decision making (see chart for details).    MDM Rules/Calculators/A&P                      Patient presents with recurrent nausea vomiting and diarrhea since Saturday.  Patient is generally weak and clinically dehydrated with significant dry mucous membranes and blood pressure was 86/47 on arrival.  Completed 500 cc from EMS and repeat 500 cc of normal saline in the ER.  General blood work, lactic acid and urinalysis Patient's blood pressure improved after firing cc however then worsened 87/48.  Lactic acid returned 4.2.  Code sepsis  initiated Rocephin ordered, blood cultures added on.  Discussed with nursing to ensure 30 cc/kg of fluids. IV abx ordered. Discussed plan for more fluids and antibiotics with patient.  Patient was having significant abdominal pain earlier hopefully this is just from toxin mediated/gastroenteritis however with age and elevated lactate plan for CT to evaluate further details.  Remaining blood work reviewed significant leukocytosis 13.5, hemoglobin 10,   Creatinine 1.8, K 3.1.  Repeat Assessment  Performed at:    [facility]     Blood pressure (!) 87/48, pulse 70, temperature 98.6 F (37 C), temperature source Oral, resp. rate (!) 31, height 4\' 11"  (1.499 m), weight 63.5 kg, SpO2 95 %.  Heart:     Regular rate and rhythm  Lungs:    CTA  Capillary Refill:   <2 sec  Peripheral Pulse:   Radial pulse palpable  Skin:     Normal Color     Patient care be signed out to follow-up results, reassess, follow up CT abdomen results and monitor vitals closely for admission.   Final Clinical Impression(s) / ED Diagnoses Final diagnoses:  Nausea vomiting and diarrhea  Abdominal pain, acute  Hypotension due to hypovolemia    Rx / DC Orders ED Discharge Orders    None       Elnora Morrison, MD 04/03/19 [phone]

## 2019-04-03 NOTE — Anesthesia Postprocedure Evaluation (Signed)
Anesthesia Post Note  Patient: Stacy Meyers  Procedure(s) Performed: CYSTOSCOPY, RIGHT RETROGRADE PYELOGRAM  WITH STENT PLACEMENT (Right )     Patient location during evaluation: PACU Anesthesia Type: General Level of consciousness: awake Pain management: pain level controlled Vital Signs Assessment: post-procedure vital signs reviewed and stable Respiratory status: spontaneous breathing Cardiovascular status: stable Postop Assessment: no apparent nausea or vomiting Anesthetic complications: no    Last Vitals:  Vitals:   04/03/19 1400 04/03/19 1500  BP: 106/70 120/71  Pulse: 66 64  Resp: (!) 24 (!) 28  Temp: 36.6 C   SpO2: 100% 94%    Last Pain:  Vitals:   04/03/19 1500  TempSrc:   PainSc: 0-No pain                 Astraea Gaughran

## 2019-04-03 NOTE — Progress Notes (Signed)
Stacy Meyers  is a 86 y.o. female with past medical history relevant for HFpEF, HLD, HTN, rheumatoid arthritis on methotrexate and prednisone therapy chronically presents to the ER with nausea vomiting and diarrhea of less than 36 hours duration  --- In the ED patient is found to be tachypneic, tachycardic and dehydrated--- UA was suspicious for UTI --Patient's recent urine culture from February 2021 grew pansensitive E. Coli -CT abdomen shows Moderate right-sided urinary tract obstruction secondary to a proximal right ureteric 1.4 cm calculus.  Bilateral nephrolithiasis.  -EDP consulted urologist Dr. Claudia Desanctis, requested hospitalist admit to [facility] long complex for further urological intervention for complicated UTI with obstructive uropathy and sepsis  patient met sepsis criteria on admission with WBC 13.5, heart rate 104, respiratory rate 31, hypotension with systolic blood pressure in the 70s to 80s, lactic acid elevated to 4.2 initially and acute kidney injury with creatinine up to 1.8 from a baseline of 0.8---after IV fluid boluses per sepsis protocol repeat lactic acid normalized at 0.7, heart rate improved/tachycardia resolved, BP improved with systolic blood pressure around 100.  04/03/19: Seen with granddaughter in the room post urologic procedure.  No complaints.  No distress.  Alert and interactive.  Lungs sound clear, heart RRR.  Vital signs stable.  Repleting electrolytes and repeating labs in the AM.  Please refer to H&P dictated by my partner Dr. Denton Brick on 3/29/221 for further details of the assessment and plan.

## 2019-04-03 NOTE — Consult Note (Signed)
I have been asked to see the patient by Dr. Laverta Baltimore, for evaluation and management of right ureteral calculus.  History of present illness: 86 year old woman presented to the [facility], ER with dehydration and nausea found to have a 1.4 cm right proximal ureteral calculus associated with leukocytosis and urinalysis consistent with infection.  Patient had soft blood pressures and was transferred to [facility] for urgent cystoscopy with ureteral stent placement.   Review of systems: A 12 point comprehensive review of systems was obtained and is negative unless otherwise stated in the history of present illness.  Patient Active Problem List   Diagnosis Date Noted  . Sepsis ([place]) 04/03/2019  . Umbilical hernia without obstruction and without gangrene 12/10/2018  . Pelvic pain in female 12/10/2018  . Cough with exposure to COVID-19 virus 11/20/2018  . Type 2 diabetes mellitus with complication, without long-term current use of insulin ([place]) 11/20/2018  . Fibroids 05/08/2016  . Spondylosis of lumbar region without myelopathy or radiculopathy 12/24/2015  . Solitary bone cyst of right shoulder 05/28/2015  . Right knee pain 05/28/2015  . Osteoarthritis of both shoulders 05/28/2015  . Overweight (BMI 25.0-29.9) 04/28/2014  . Ganglion cyst of wrist 05/11/2012  . Systolic murmur 123456  . Acute cystitis with hematuria 04/11/2011  . Diastolic dysfunction XX123456  . Seasonal allergies 08/28/2010  . Type 2 diabetes, diet controlled ([place]) 07/25/2009  . Herpes simplex virus (HSV) infection 09/16/2008  . Rheumatoid arthritis with rheumatoid factor of multiple sites without organ or systems involvement ([place]) 09/16/2008  . Hyperlipidemia LDL goal <100 05/23/2008  . Essential hypertension, benign 05/23/2008    No current facility-administered medications on file prior to encounter.   Current Outpatient Medications on File Prior to Encounter  Medication Sig Dispense Refill  . acetaminophen  (TYLENOL) 500 MG tablet Take 1,000 mg by mouth every 6 (six) hours as needed. Reported on 05/28/2015    . acyclovir (ZOVIRAX) 400 MG tablet TAKE 1 TABLET BY MOUTH TWICE A DAY 180 tablet 1  . alendronate (FOSAMAX) 70 MG tablet TAKE 1 TABLET BY MOUTH EVERY 7 DAYS. TAKE WITH A FULL GLASS OF WATER ON AN EMPTY STOMACH. 12 tablet 1  . amLODipine (NORVASC) 5 MG tablet TAKE 1 TABLET BY MOUTH EVERY DAY 90 tablet 0  . azelastine (ASTELIN) 0.1 % nasal spray Place 2 sprays into both nostrils 2 (two) times daily. Use in each nostril as directed (Patient taking differently: Place 2 sprays into both nostrils as needed. Use in each nostril as directed) 30 mL 12  . folic acid (FOLVITE) 1 MG tablet TAKE 1 TABLET BY MOUTH EVERY DAY 90 tablet 4  . KLOR-CON M20 20 MEQ tablet TAKE 1 TABLET (20 MEQ TOTAL) BY MOUTH DAILY. 90 tablet 1  . methotrexate (RHEUMATREX) 2.5 MG tablet TAKE 3 TABLETS BY MOUTH ONCE A WEEK. TAKE AS DIRECTED CAUTION:CHEMOTHERAPY. PROTECT FROM LIGHT 36 tablet 0  . montelukast (SINGULAIR) 10 MG tablet TAKE 1 TABLET BY MOUTH EVERYDAY AT BEDTIME 90 tablet 1  . predniSONE (DELTASONE) 1 MG tablet TAKE 4 TABLETS (4 MG TOTAL) BY MOUTH DAILY WITH BREAKFAST. 360 tablet 0  . rosuvastatin (CRESTOR) 5 MG tablet TAKE 1 TABLET BY MOUTH EVERY OTHER DAY 45 tablet 0  . UNABLE TO FIND Diabetic shoes x 1 Inserts x 3  DX E11.9 1 each 0  . UNABLE TO FIND Diabetic shoes x 1 inserts x 3  Dx e11.9 1 each 0    Past Medical History:  Diagnosis Date  .  Acute cholecystitis 12/16/2010  . Anemia 12/14/2010  . ARF (acute renal failure) ([place]) 12/14/2010  . Bradycardia    Sinus - noted 6/11  . Cholelithiasis 12/14/2010  . Collagen vascular disease ([place])   . Diastolic dysfunction [id number]   Grade 1 per 2-D echocardiogram  . Dysrhythmia   . Essential hypertension, benign   . HTN (hypertension) 12/14/2010  . Hypercalcemia 12/14/2010  . Hypokalemia 12/14/2010  . Mixed hyperlipidemia   . Osteoarthritis   . Rheumatoid  arthritis(714.0)     Past Surgical History:  Procedure Laterality Date  . Bilateral cataract surgery  2009  . BUNIONECTOMY Right 2012  . CHOLECYSTECTOMY  12/17/2010   Procedure: LAPAROSCOPIC CHOLECYSTECTOMY;  Surgeon: Donato Heinz;  Location: [facility] ORS;  Service: General;  Laterality: N/A;  . COLONOSCOPY  05/22/2011   Procedure: COLONOSCOPY;  Surgeon: Rogene Houston, MD;  Location: [facility] ENDO SUITE;  Service: Endoscopy;  Laterality: N/A;  1045  . EYE SURGERY Bilateral    cataract  . GANGLION CYST EXCISION Left 06/02/2012   Procedure: Excision ganglion left dorsal wrist;  Surgeon: Sanjuana Kava, MD;  Location: [facility] ORS;  Service: Orthopedics;  Laterality: Left;  . L2-L3 discectomy  2002  . L3-L4, L4-L5 lumbar decompression  2011  . Right shoulder cyst excision  2010  . SPINE SURGERY     twice, approx 2006, 2011    Social History   Tobacco Use  . Smoking status: Never Smoker  . Smokeless tobacco: Never Used  . Tobacco comment: Second hand smoke   Substance Use Topics  . Alcohol use: No  . Drug use: Never    Family History  Problem Relation Age of Onset  . Stroke Mother   . Cancer Father   . Diabetes Sister   . Diabetes Sister   . Kidney failure Brother   . Kidney failure Brother   . Pancreatic cancer Sister   . Kidney Stones Sister        Only one Kidney  . Arthritis Sister   . Heart disease Daughter     PE: Vitals:   04/03/19 0630 04/03/19 0803 04/03/19 0900 04/03/19 1203  BP: (!) 87/48 (!) 78/51 (!) 98/54 (!) 99/50  Pulse: 70 76 72 70  Resp: (!) 31 (!) 32 (!) 37 16  Temp:  100 F (37.8 C)  98 F (36.7 C)  TempSrc:  Rectal  Oral  SpO2: 95% 93% 92% 94%  Weight:      Height:       Patient appears to be in no acute distress  patient is alert and oriented x3 Atraumatic normocephalic head No cervical or supraclavicular lymphadenopathy appreciated No increased work of breathing, no audible wheezes/rhonchi Regular sinus rhythm/rate Abdomen is soft, nontender,  nondistended, no CVA or suprapubic tenderness Lower extremities are symmetric without appreciable edema Grossly neurologically intact No identifiable skin lesions  Recent Labs    04/03/19 0604  WBC 13.5*  HGB 10.3*  HCT 32.8*   Recent Labs    04/03/19 0604  NA 139  K 3.1*  CL 108  CO2 22  GLUCOSE 141*  BUN 31*  CREATININE 1.86*  CALCIUM 8.3*   Recent Labs    04/03/19 0708  INR 1.5*   No results for input(s): LABURIN in the last 72 hours. Results for orders placed or performed during the hospital encounter of 04/03/19  Blood Culture (routine x 2)     Status: None (Preliminary result)   Collection Time: 04/03/19  7:08 AM  Specimen: BLOOD RIGHT ARM  Result Value Ref Range Status   Specimen Description BLOOD RIGHT ARM  Final   Special Requests   Final    BOTTLES DRAWN AEROBIC AND ANAEROBIC Blood Culture adequate volume Performed at [facility], [address], [place], [place] [address]    Culture PENDING  Incomplete   Report Status PENDING  Incomplete  Blood Culture (routine x 2)     Status: None (Preliminary result)   Collection Time: 04/03/19  7:08 AM   Specimen: BLOOD RIGHT HAND  Result Value Ref Range Status   Specimen Description BLOOD RIGHT HAND  Final   Special Requests   Final    BOTTLES DRAWN AEROBIC ONLY Blood Culture results may not be optimal due to an inadequate volume of blood received in culture bottles Performed at [facility], [address], [place], [place] [address]    Culture PENDING  Incomplete   Report Status PENDING  Incomplete  Respiratory Panel by RT PCR (Flu A&B, Covid) - Nasopharyngeal Swab     Status: None   Collection Time: 04/03/19  8:26 AM   Specimen: Nasopharyngeal Swab  Result Value Ref Range Status   SARS Coronavirus 2 by RT PCR NEGATIVE NEGATIVE Final    Comment: (NOTE) SARS-CoV-2 target nucleic acids are NOT DETECTED. The SARS-CoV-2 RNA is generally detectable in upper respiratoy specimens during the acute phase of  infection. The lowest concentration of SARS-CoV-2 viral copies this assay can detect is 131 copies/mL. A negative result does not preclude SARS-Cov-2 infection and should not be used as the sole basis for treatment or other patient management decisions. A negative result may occur with  improper specimen collection/handling, submission of specimen other than nasopharyngeal swab, presence of viral mutation(s) within the areas targeted by this assay, and inadequate number of viral copies (<131 copies/mL). A negative result must be combined with clinical observations, patient history, and epidemiological information. The expected result is Negative. Fact Sheet for Patients:  PinkCheek.be Fact Sheet for Healthcare Providers:  GravelBags.it This test is not yet ap proved or cleared by the [place] FDA and  has been authorized for detection and/or diagnosis of SARS-CoV-2 by FDA under an Emergency Use Authorization (EUA). This EUA will remain  in effect (meaning this test can be used) for the duration of the COVID-19 declaration under Section 564(b)(1) of the Act, 21 U.S.C. section 360bbb-3(b)(1), unless the authorization is terminated or revoked sooner.    Influenza A by PCR NEGATIVE NEGATIVE Final   Influenza B by PCR NEGATIVE NEGATIVE Final    Comment: (NOTE) The Xpert Xpress SARS-CoV-2/FLU/RSV assay is intended as an aid in  the diagnosis of influenza from Nasopharyngeal swab specimens and  should not be used as a sole basis for treatment. Nasal washings and  aspirates are unacceptable for Xpert Xpress SARS-CoV-2/FLU/RSV  testing. Fact Sheet for Patients: PinkCheek.be Fact Sheet for Healthcare Providers: GravelBags.it This test is not yet approved or cleared by the [place] FDA and  has been authorized for detection and/or diagnosis of SARS-CoV-2 by  FDA under  an Emergency Use Authorization (EUA). This EUA will remain  in effect (meaning this test can be used) for the duration of the  Covid-19 declaration under Section 564(b)(1) of the Act, 21  U.S.C. section 360bbb-3(b)(1), unless the authorization is  terminated or revoked. Performed at [facility], [address] [place], [place] [address]     Imaging: CT Abd/Pelvis 04/03/19 IMPRESSION: 1. Moderate right-sided urinary tract obstruction secondary to a  proximal right ureteric 1.4 cm calculus. 2. Bilateral nephrolithiasis. 3. Tiny bilateral pleural effusions. 4. Fibroid uterus. 5. Coronary artery atherosclerosis. Aortic Atherosclerosis (ICD10-I70.0). 6. Ascending colonic lipoma, without obstruction.    A/P: 86 year old woman presented to the ER with dehydration found to have a 1.5 cm right proximal ureteral calculus with moderate hydronephrosis and signs of infection on UA and CBC.  She was transferred to [facility] for concerns of sepsis.  -Risks and benefits of a cystoscopy with a right ureteral stent placement discussed with the patient and informed consent obtained -Patient understands she will need a staged procedure for removal of stone and 2 to 4 weeks -appreciate pt being admitted to hospitalist service given age and comorbidities   Thank you for involving me in this patient's care, I will continue to follow along.  Please page with any further questions or concerns. Grayton Lobo D Shaniya Tashiro

## 2019-04-03 NOTE — Progress Notes (Signed)
Notified bedside nurse of need to draw repeat lactic acid. 

## 2019-04-03 NOTE — H&P (View-Only) (Signed)
I have been asked to see the patient by Dr. Laverta Baltimore, for evaluation and management of right ureteral calculus.  History of present illness: 86 year old woman presented to the [facility], ER with dehydration and nausea found to have a 1.4 cm right proximal ureteral calculus associated with leukocytosis and urinalysis consistent with infection.  Patient had soft blood pressures and was transferred to [facility] for urgent cystoscopy with ureteral stent placement.   Review of systems: A 12 point comprehensive review of systems was obtained and is negative unless otherwise stated in the history of present illness.  Patient Active Problem List   Diagnosis Date Noted  . Sepsis ([place]) 04/03/2019  . Umbilical hernia without obstruction and without gangrene 12/10/2018  . Pelvic pain in female 12/10/2018  . Cough with exposure to COVID-19 virus 11/20/2018  . Type 2 diabetes mellitus with complication, without long-term current use of insulin ([place]) 11/20/2018  . Fibroids 05/08/2016  . Spondylosis of lumbar region without myelopathy or radiculopathy 12/24/2015  . Solitary bone cyst of right shoulder 05/28/2015  . Right knee pain 05/28/2015  . Osteoarthritis of both shoulders 05/28/2015  . Overweight (BMI 25.0-29.9) 04/28/2014  . Ganglion cyst of wrist 05/11/2012  . Systolic murmur 123456  . Acute cystitis with hematuria 04/11/2011  . Diastolic dysfunction XX123456  . Seasonal allergies 08/28/2010  . Type 2 diabetes, diet controlled ([place]) 07/25/2009  . Herpes simplex virus (HSV) infection 09/16/2008  . Rheumatoid arthritis with rheumatoid factor of multiple sites without organ or systems involvement ([place]) 09/16/2008  . Hyperlipidemia LDL goal <100 05/23/2008  . Essential hypertension, benign 05/23/2008    No current facility-administered medications on file prior to encounter.   Current Outpatient Medications on File Prior to Encounter  Medication Sig Dispense Refill  . acetaminophen  (TYLENOL) 500 MG tablet Take 1,000 mg by mouth every 6 (six) hours as needed. Reported on 05/28/2015    . acyclovir (ZOVIRAX) 400 MG tablet TAKE 1 TABLET BY MOUTH TWICE A DAY 180 tablet 1  . alendronate (FOSAMAX) 70 MG tablet TAKE 1 TABLET BY MOUTH EVERY 7 DAYS. TAKE WITH A FULL GLASS OF WATER ON AN EMPTY STOMACH. 12 tablet 1  . amLODipine (NORVASC) 5 MG tablet TAKE 1 TABLET BY MOUTH EVERY DAY 90 tablet 0  . azelastine (ASTELIN) 0.1 % nasal spray Place 2 sprays into both nostrils 2 (two) times daily. Use in each nostril as directed (Patient taking differently: Place 2 sprays into both nostrils as needed. Use in each nostril as directed) 30 mL 12  . folic acid (FOLVITE) 1 MG tablet TAKE 1 TABLET BY MOUTH EVERY DAY 90 tablet 4  . KLOR-CON M20 20 MEQ tablet TAKE 1 TABLET (20 MEQ TOTAL) BY MOUTH DAILY. 90 tablet 1  . methotrexate (RHEUMATREX) 2.5 MG tablet TAKE 3 TABLETS BY MOUTH ONCE A WEEK. TAKE AS DIRECTED CAUTION:CHEMOTHERAPY. PROTECT FROM LIGHT 36 tablet 0  . montelukast (SINGULAIR) 10 MG tablet TAKE 1 TABLET BY MOUTH EVERYDAY AT BEDTIME 90 tablet 1  . predniSONE (DELTASONE) 1 MG tablet TAKE 4 TABLETS (4 MG TOTAL) BY MOUTH DAILY WITH BREAKFAST. 360 tablet 0  . rosuvastatin (CRESTOR) 5 MG tablet TAKE 1 TABLET BY MOUTH EVERY OTHER DAY 45 tablet 0  . UNABLE TO FIND Diabetic shoes x 1 Inserts x 3  DX E11.9 1 each 0  . UNABLE TO FIND Diabetic shoes x 1 inserts x 3  Dx e11.9 1 each 0    Past Medical History:  Diagnosis Date  .  Acute cholecystitis 12/16/2010  . Anemia 12/14/2010  . ARF (acute renal failure) ([place]) 12/14/2010  . Bradycardia    Sinus - noted 6/11  . Cholelithiasis 12/14/2010  . Collagen vascular disease ([place])   . Diastolic dysfunction [id number]   Grade 1 per 2-D echocardiogram  . Dysrhythmia   . Essential hypertension, benign   . HTN (hypertension) 12/14/2010  . Hypercalcemia 12/14/2010  . Hypokalemia 12/14/2010  . Mixed hyperlipidemia   . Osteoarthritis   . Rheumatoid  arthritis(714.0)     Past Surgical History:  Procedure Laterality Date  . Bilateral cataract surgery  2009  . BUNIONECTOMY Right 2012  . CHOLECYSTECTOMY  12/17/2010   Procedure: LAPAROSCOPIC CHOLECYSTECTOMY;  Surgeon: Donato Heinz;  Location: [facility] ORS;  Service: General;  Laterality: N/A;  . COLONOSCOPY  05/22/2011   Procedure: COLONOSCOPY;  Surgeon: Rogene Houston, MD;  Location: [facility];  Service: Endoscopy;  Laterality: N/A;  1045  . EYE SURGERY Bilateral    cataract  . GANGLION CYST EXCISION Left 06/02/2012   Procedure: Excision ganglion left dorsal wrist;  Surgeon: Sanjuana Kava, MD;  Location: [facility] ORS;  Service: Orthopedics;  Laterality: Left;  . L2-L3 discectomy  2002  . L3-L4, L4-L5 lumbar decompression  2011  . Right shoulder cyst excision  2010  . SPINE SURGERY     twice, approx 2006, 2011    Social History   Tobacco Use  . Smoking status: Never Smoker  . Smokeless tobacco: Never Used  . Tobacco comment: Second hand smoke   Substance Use Topics  . Alcohol use: No  . Drug use: Never    Family History  Problem Relation Age of Onset  . Stroke Mother   . Cancer Father   . Diabetes Sister   . Diabetes Sister   . Kidney failure Brother   . Kidney failure Brother   . Pancreatic cancer Sister   . Kidney Stones Sister        Only one Kidney  . Arthritis Sister   . Heart disease Daughter     PE: Vitals:   04/03/19 0630 04/03/19 0803 04/03/19 0900 04/03/19 1203  BP: (!) 87/48 (!) 78/51 (!) 98/54 (!) 99/50  Pulse: 70 76 72 70  Resp: (!) 31 (!) 32 (!) 37 16  Temp:  100 F (37.8 C)  98 F (36.7 C)  TempSrc:  Rectal  Oral  SpO2: 95% 93% 92% 94%  Weight:      Height:       Patient appears to be in no acute distress  patient is alert and oriented x3 Atraumatic normocephalic head No cervical or supraclavicular lymphadenopathy appreciated No increased work of breathing, no audible wheezes/rhonchi Regular sinus rhythm/rate Abdomen is soft, nontender,  nondistended, no CVA or suprapubic tenderness Lower extremities are symmetric without appreciable edema Grossly neurologically intact No identifiable skin lesions  Recent Labs    04/03/19 0604  WBC 13.5*  HGB 10.3*  HCT 32.8*   Recent Labs    04/03/19 0604  NA 139  K 3.1*  CL 108  CO2 22  GLUCOSE 141*  BUN 31*  CREATININE 1.86*  CALCIUM 8.3*   Recent Labs    04/03/19 0708  INR 1.5*   No results for input(s): LABURIN in the last 72 hours. Results for orders placed or performed during the hospital encounter of 04/03/19  Blood Culture (routine x 2)     Status: None (Preliminary result)   Collection Time: 04/03/19  7:08 AM  Specimen: BLOOD RIGHT ARM  Result Value Ref Range Status   Specimen Description BLOOD RIGHT ARM  Final   Special Requests   Final    BOTTLES DRAWN AEROBIC AND ANAEROBIC Blood Culture adequate volume Performed at [facility], [address], [place], [place] [address]    Culture PENDING  Incomplete   Report Status PENDING  Incomplete  Blood Culture (routine x 2)     Status: None (Preliminary result)   Collection Time: 04/03/19  7:08 AM   Specimen: BLOOD RIGHT HAND  Result Value Ref Range Status   Specimen Description BLOOD RIGHT HAND  Final   Special Requests   Final    BOTTLES DRAWN AEROBIC ONLY Blood Culture results may not be optimal due to an inadequate volume of blood received in culture bottles Performed at [facility], [address], [place], [place] [address]    Culture PENDING  Incomplete   Report Status PENDING  Incomplete  Respiratory Panel by RT PCR (Flu A&B, Covid) - Nasopharyngeal Swab     Status: None   Collection Time: 04/03/19  8:26 AM   Specimen: Nasopharyngeal Swab  Result Value Ref Range Status   SARS Coronavirus 2 by RT PCR NEGATIVE NEGATIVE Final    Comment: (NOTE) SARS-CoV-2 target nucleic acids are NOT DETECTED. The SARS-CoV-2 RNA is generally detectable in upper respiratoy specimens during the acute phase of  infection. The lowest concentration of SARS-CoV-2 viral copies this assay can detect is 131 copies/mL. A negative result does not preclude SARS-Cov-2 infection and should not be used as the sole basis for treatment or other patient management decisions. A negative result may occur with  improper specimen collection/handling, submission of specimen other than nasopharyngeal swab, presence of viral mutation(s) within the areas targeted by this assay, and inadequate number of viral copies (<131 copies/mL). A negative result must be combined with clinical observations, patient history, and epidemiological information. The expected result is Negative. Fact Sheet for Patients:  PinkCheek.be Fact Sheet for Healthcare Providers:  GravelBags.it This test is not yet ap proved or cleared by the [place] FDA and  has been authorized for detection and/or diagnosis of SARS-CoV-2 by FDA under an Emergency Use Authorization (EUA). This EUA will remain  in effect (meaning this test can be used) for the duration of the COVID-19 declaration under Section 564(b)(1) of the Act, 21 U.S.C. section 360bbb-3(b)(1), unless the authorization is terminated or revoked sooner.    Influenza A by PCR NEGATIVE NEGATIVE Final   Influenza B by PCR NEGATIVE NEGATIVE Final    Comment: (NOTE) The Xpert Xpress SARS-CoV-2/FLU/RSV assay is intended as an aid in  the diagnosis of influenza from Nasopharyngeal swab specimens and  should not be used as a sole basis for treatment. Nasal washings and  aspirates are unacceptable for Xpert Xpress SARS-CoV-2/FLU/RSV  testing. Fact Sheet for Patients: PinkCheek.be Fact Sheet for Healthcare Providers: GravelBags.it This test is not yet approved or cleared by the [place] FDA and  has been authorized for detection and/or diagnosis of SARS-CoV-2 by  FDA under  an Emergency Use Authorization (EUA). This EUA will remain  in effect (meaning this test can be used) for the duration of the  Covid-19 declaration under Section 564(b)(1) of the Act, 21  U.S.C. section 360bbb-3(b)(1), unless the authorization is  terminated or revoked. Performed at [facility], [address], [place], [place] [address]     Imaging: CT Abd/Pelvis 04/03/19 IMPRESSION: 1. Moderate right-sided urinary tract obstruction secondary to a  proximal right ureteric 1.4 cm calculus. 2. Bilateral nephrolithiasis. 3. Tiny bilateral pleural effusions. 4. Fibroid uterus. 5. Coronary artery atherosclerosis. Aortic Atherosclerosis (ICD10-I70.0). 6. Ascending colonic lipoma, without obstruction.    A/P: 86 year old woman presented to the ER with dehydration found to have a 1.5 cm right proximal ureteral calculus with moderate hydronephrosis and signs of infection on UA and CBC.  She was transferred to [facility] for concerns of sepsis.  -Risks and benefits of a cystoscopy with a right ureteral stent placement discussed with the patient and informed consent obtained -Patient understands she will need a staged procedure for removal of stone and 2 to 4 weeks -appreciate pt being admitted to hospitalist service given age and comorbidities   Thank you for involving me in this patient's care, I will continue to follow along.  Please page with any further questions or concerns. Linzey Ramser D Aerilynn Goin

## 2019-04-03 NOTE — Anesthesia Preprocedure Evaluation (Signed)
Anesthesia Evaluation  Patient identified by MRN, date of birth, ID band Patient awake    Reviewed: Allergy & Precautions, NPO status , reviewed documented beta blocker date and time   Airway Mallampati: II  TM Distance: >3 FB     Dental   Pulmonary    breath sounds clear to auscultation       Cardiovascular hypertension, + dysrhythmias + Valvular Problems/Murmurs  Rhythm:Regular Rate:Normal     Neuro/Psych    GI/Hepatic negative GI ROS, Neg liver ROS,   Endo/Other  diabetes  Renal/GU Renal disease     Musculoskeletal  (+) Arthritis ,   Abdominal   Peds  Hematology   Anesthesia Other Findings   Reproductive/Obstetrics                             Anesthesia Physical Anesthesia Plan  ASA: III  Anesthesia Plan: General   Post-op Pain Management:    Induction:   PONV Risk Score and Plan: 3 and Ondansetron, Dexamethasone and Midazolam  Airway Management Planned: LMA  Additional Equipment:   Intra-op Plan:   Post-operative Plan: Extubation in OR  Informed Consent: I have reviewed the patients History and Physical, chart, labs and discussed the procedure including the risks, benefits and alternatives for the proposed anesthesia with the patient or authorized representative who has indicated his/her understanding and acceptance.     Dental advisory given  Plan Discussed with: CRNA and Anesthesiologist  Anesthesia Plan Comments:         Anesthesia Quick Evaluation

## 2019-04-03 NOTE — Progress Notes (Signed)
Notified bedside nurse of need to draw repeat lactic acid now and pt needs 1905 cc fluid.

## 2019-04-03 NOTE — ED Triage Notes (Addendum)
Pt from home via EMS. Pt C/O N/V and diarrhea that started Saturday night 04/01/19. Pt denies pain. Pt states she "ate some bad brownies." Pt received 200cc NaCl prior to arrival. Pt hypotensive 88/50 in route.

## 2019-04-03 NOTE — Progress Notes (Signed)
Pharmacy Antibiotic Note  Stacy Meyers is a 86 y.o. female admitted on 04/03/2019 with sepsis. Pt transferred to [facility] from [facility] and underwent cystoscopy with stent placement on 3/29.  Pharmacy has been consulted for piperacillin/tazobactam dosing.  Today, 04/03/19  WBC 13.5 - elevated  SCr 1.86, CrCl ~18 mL/min  Lactate 0.7  Plan:  Piperacillin/tazobactam 3.375 g IV once given earlier today. Will order piperacillin/tazobactam 2.25 g IV q8h  Follow renal function for any necessary dose adjustment  Follow culture data  Height: 4\' 11"  (149.9 cm) Weight: 140 lb (63.5 kg) IBW/kg (Calculated) : 43.2  Temp (24hrs), Avg:98.4 F (36.9 C), Min:97.8 F (36.6 C), Max:100 F (37.8 C)  Recent Labs  Lab 04/03/19 0604 04/03/19 0811  WBC 13.5*  --   CREATININE 1.86*  --   LATICACIDVEN 4.2* 0.7    Estimated Creatinine Clearance: 17.9 mL/min (A) (by C-G formula based on SCr of 1.86 mg/dL (H)).    Allergies  Allergen Reactions  . Aspirin Other (See Comments)    REACTION: burns stomach (uncoated)  . Sudafed [Pseudoephedrine Hcl] Other (See Comments)    Increased joint pain    Antimicrobials this admission: Piperacillin/tazobactam 3/29 >>   Dose adjustments this admission:  Microbiology results: 3/29 BCx: ngtd 3/29 UCx: sent   Lenis Noon, PharmD 04/03/2019 2:49 PM

## 2019-04-03 NOTE — Transfer of Care (Signed)
Immediate Anesthesia Transfer of Care Note  Patient: Stacy Meyers  Procedure(s) Performed: CYSTOSCOPY, RIGHT RETROGRADE PYELOGRAM  WITH STENT PLACEMENT (Right )  Patient Location: PACU  Anesthesia Type:General  Level of Consciousness: drowsy and patient cooperative  Airway & Oxygen Therapy: Patient Spontanous Breathing and Patient connected to face mask oxygen  Post-op Assessment: Report given to RN and Post -op Vital signs reviewed and stable  Post vital signs: Reviewed and stable  Last Vitals:  Vitals Value Taken Time  BP 111/63 04/03/19 1318  Temp    Pulse 118 04/03/19 1319  Resp 31 04/03/19 1320  SpO2 89 % 04/03/19 1319  Vitals shown include unvalidated device data.  Last Pain:  Vitals:   04/03/19 1203  TempSrc: Oral  PainSc:          Complications: No apparent anesthesia complications

## 2019-04-03 NOTE — Op Note (Signed)
Preoperative diagnosis:  1. Right ureteral calculus with infection  Postoperative diagnosis:  1. Right ureteral calculus with infection  Procedure:  1. Cystoscopy 2. right ureteral stent placement 6Fr x 24cm 3. right retrograde pyelography with interpretation - right hydronephrosis to level of proximal ureteral calculus   Surgeon: Jacalyn Lefevre, MD  Anesthesia: General  Complications: None  Intraoperative findings:  right retrograde pyelography demonstrated a filling defect within the right ureter consistent with the patient's known calculus without other abnormalities.  EBL: Minimal  Specimens: None  Indication: Stacy Meyers is a 86 y.o. patient with 1.4 cm right proximal ureteral calculus with hydronephrosis and signs of infection. After reviewing the management options for treatment, he elected to proceed with the above surgical procedure(s). We have discussed the potential benefits and risks of the procedure, side effects of the proposed treatment, the likelihood of the patient achieving the goals of the procedure, and any potential problems that might occur during the procedure or recuperation. Informed consent has been obtained.  Description of procedure:  The patient was taken to the operating room and general anesthesia was induced.  The patient was placed in the dorsal lithotomy position, prepped and draped in the usual sterile fashion, and preoperative antibiotics were administered. A preoperative time-out was performed.   Cystourethroscopy was performed.  The patient's urethra was examined and was normal. The bladder was then systematically examined in its entirety. There was no evidence for any bladder tumors, stones, or other mucosal pathology.    Attention then turned to the rightureteral orifice and a ureteral catheter was used to intubate the ureteral orifice.  Omnipaque contrast was injected through the ureteral catheter and a retrograde pyelogram was performed with  findings as dictated above.  A 0.38 sensor guidewire was then advanced up the right ureter into the renal pelvis under fluoroscopic guidance.  The wire was then backloaded through the cystoscope and a ureteral stent was advance over the wire using Seldinger technique.  The stent was positioned appropriately under fluoroscopic and cystoscopic guidance.  The wire was then removed with an adequate stent curl noted in the renal pelvis as well as in the bladder.  The bladder was then emptied and the procedure ended.  The patient appeared to tolerate the procedure well and without complications.  The patient was able to be awakened and transferred to the recovery unit in satisfactory condition.    Jacalyn Lefevre, M.D.

## 2019-04-03 NOTE — Anesthesia Procedure Notes (Signed)
Procedure Name: Intubation Date/Time: 04/03/2019 12:48 PM Performed by: Montel Clock, CRNA Pre-anesthesia Checklist: Patient identified, Emergency Drugs available, Suction available, Patient being monitored and Timeout performed Patient Re-evaluated:Patient Re-evaluated prior to induction Oxygen Delivery Method: Circle system utilized Preoxygenation: Pre-oxygenation with 100% oxygen Induction Type: IV induction and Rapid sequence Laryngoscope Size: Mac and 3 Grade View: Grade II Tube type: Oral Tube size: 7.0 mm Number of attempts: 1 Airway Equipment and Method: Stylet Placement Confirmation: ETT inserted through vocal cords under direct vision,  positive ETCO2 and breath sounds checked- equal and bilateral Secured at: 21 cm Tube secured with: Tape Dental Injury: Teeth and Oropharynx as per pre-operative assessment

## 2019-04-03 NOTE — ED Provider Notes (Signed)
Blood pressure (!) 87/48, pulse 70, temperature 98.6 F (37 C), temperature source Oral, resp. rate (!) 31, height 4\' 11"  (1.499 m), weight 63.5 kg, SpO2 95 %.  Assuming care from Dr. Reather Converse.  In short, Stacy Meyers is a 86 y.o. female with a chief complaint of Emesis .  Refer to the original H&P for additional details.  The current plan of care is to f/u after CT and admit.  09:21 AM  CT reviewed and Urology consulted. On reassessment, BP improving with IVF. Patient is awake and alert. Explained the findings and plan for admit with likely Urology intervention required on the stone.   10:25 AM  Spoke with Dr. Claudia Desanctis with Urology.  Agrees to accept the patient and would preferably like to have them at [facility].  I reassessed the patient and she remains awake and alert.  Blood pressures are in the 0000000 systolic and responding well to the IV fluids.  I believe she is safe for transfer at this time.  Dr. Claudia Desanctis states they can accept her directly to the short stay area/preop.  Dr. Claudia Desanctis will follow with the hospitalist for admission after the procedure.  I have called [facility] to initiate an emergent transfer.   Updated the patient's daughter by phone.   Discussed patient's case with [facility], Dr. Denton Brick to request admission. Patient and family (if present) updated with plan. Care transferred to [facility] service.  I reviewed all nursing notes, vitals, pertinent old records, EKGs, labs, imaging (as available).   CRITICAL CARE Performed by: Margette Fast Total critical care time: 35 minutes Critical care time was exclusive of separately billable procedures and treating other patients. Critical care was necessary to treat or prevent imminent or life-threatening deterioration. Critical care was time spent personally by me on the following activities: development of treatment plan with patient and/or surrogate as well as nursing, discussions with consultants, evaluation of patient's response to treatment,  examination of patient, obtaining history from patient or surrogate, ordering and performing treatments and interventions, ordering and review of laboratory studies, ordering and review of radiographic studies, pulse oximetry and re-evaluation of patient's condition.  Nanda Quinton, MD Emergency Medicine     Daneen Volcy, [facility], MD 04/03/19 [phone]

## 2019-04-03 NOTE — ED Notes (Signed)
Date and time results received: 04/03/19 6:37 AM  (use smartphrase ".now" to insert current time)  Test: Lactic Acid Critical Value: 4.2  Name of Provider Notified: Dr. Reather Converse  Orders Received? Or Actions Taken?: see chart

## 2019-04-03 NOTE — Plan of Care (Signed)
Patient will call for assistance when needing to get put of bed. Bed alarm set. Call Kanan and belongings are within reach.

## 2019-04-03 NOTE — Progress Notes (Signed)
CRITICAL VALUE ALERT  Critical Value: Blood cultures- Gram negative rods in both aerobic and anaerobic bottles  Date & Time Notified: 04/03/19 2308  Provider Notified: M. Sharlet Salina, NP

## 2019-04-03 NOTE — Progress Notes (Signed)
Pt transported to [facility] for urology consult

## 2019-04-03 NOTE — H&P (Signed)
Patient Demographics:    Stacy Meyers, is a 86 y.o. female  MRN: [id number]   DOB - 04/14/1932  Admit Date - 04/03/2019  Outpatient Primary MD for the patient is Fayrene Helper, MD   Assessment & Plan:    Principal Problem:   Sepsis secondary to UTI /Nephrolithiasis with obstructive uropathy Active Problems:   Right nephrolithiasis-1.4cm Moderate right-sided urinary tract obstruction/hydronephrosis   Rt Sided Acute unilateral obstructive uropathy- 1.4 cm Prox Ureteric Stone   Type 2 diabetes, diet controlled ([place])   H/o Chronic heart failure with preserved ejection fraction (HFpEF) (HCC)   Rheumatoid arthritis with rheumatoid factor of multiple sites without organ or systems involvement ([place])   CT Abd on 04/03/19-- 1)Moderate right-sided urinary tract obstruction secondary to a proximal right ureteric 1.4 cm calculus. 2. Bilateral nephrolithiasis.  A/p 1)Sepsis --- secondary to presumed complicated UTI in the setting of nephrolithiasis with obstructive uropathy- -patient met sepsis criteria on admission with WBC 13.5, heart rate 104, respiratory rate 31, hypotension with systolic blood pressure in the 70s to 80s, lactic acid elevated to 4.2 initially and acute kidney injury with creatinine up to 1.8 from a baseline of 0.8---after IV fluid boluses per sepsis protocol repeat lactic acid normalized at 0.7, heart rate improved/tachycardia resolved, BP improved with systolic blood pressure around 100 mmhg -Patient's recent urine culture from February 2021 grew pansensitive E. coli --Continue IV Zosyn pending blood and urine cultures -UA strongly suggestive of UTI,    2)Rt Nephrolithiasis---  With Moderate Obstruction/ Rt side Hydronephrosis------- Discussed with Dr Claudia Desanctis (urologist)  S/p cystoscopy with a right ureteral  stent placement on 04/03/19 Plan is for a staged procedure for removal of stone and 2 to 4 weeks--  3)DM2-last A1c 6.3 reflecting excellent DM control PTA -Patient will be n.p.o. for urological procedure so Allow some permissive Hyperglycemia rather than risk life-threatening hypoglycemia in a patient with unreliable oral intake. Use Novolog/Humalog Sliding scale insulin with Accu-Cheks/Fingersticks as ordered  4)Rheumatoid arthritis--- patient is chronically on prednisone 4 mg daily, okay to increase to 10 mg daily to cover for "stress"------ consider much higher doses of hydrocortisone if hypotension persist --Hold methotrexate due to sepsis/acute infection  5)HLD--- stable, continue Crestor  6)H/o HTN--- hold amlodipine as patient is currently hypotensive secondary to sepsis  7)HFpEF--- patient with history of chronic diastolic dysfunction CHF, on admission patient appears dehydrated secondary to GI losses, she was hypotensive with sepsis--- responded well to aggressive IV fluids --- Continue to monitor closely to avoid volume overload  8)N/V/D---??? secondary to complicated UTI--- IV Zosyn check stool for C. difficile and stool for GI pathogen  9)Social/Ethics----  Discussed with patient and grand-daughter---[phone]------- patient is a full code  With History of - Reviewed by me  Past Medical History:  Diagnosis Date  . Acute cholecystitis 12/16/2010  . Anemia 12/14/2010  . ARF (acute renal failure) ([place]) 12/14/2010  . Bradycardia    Sinus - noted 6/11  . Cholelithiasis 12/14/2010  .  Collagen vascular disease ([place])   . Diastolic dysfunction 75/17/0017   Grade 1 per 2-D echocardiogram  . Dysrhythmia   . Essential hypertension, benign   . HTN (hypertension) 12/14/2010  . Hypercalcemia 12/14/2010  . Hypokalemia 12/14/2010  . Mixed hyperlipidemia   . Osteoarthritis   . Rheumatoid arthritis(714.0)       Past Surgical History:  Procedure Laterality Date  . Bilateral cataract  surgery  2009  . BUNIONECTOMY Right 2012  . CHOLECYSTECTOMY  12/17/2010   Procedure: LAPAROSCOPIC CHOLECYSTECTOMY;  Surgeon: Donato Heinz;  Location: [facility];  Service: General;  Laterality: N/A;  . COLONOSCOPY  05/22/2011   Procedure: COLONOSCOPY;  Surgeon: Rogene Houston, MD;  Location: [facility];  Service: Endoscopy;  Laterality: N/A;  1045  . EYE SURGERY Bilateral    cataract  . GANGLION CYST EXCISION Left 06/02/2012   Procedure: Excision ganglion left dorsal wrist;  Surgeon: Sanjuana Kava, MD;  Location: [facility];  Service: Orthopedics;  Laterality: Left;  . L2-L3 discectomy  2002  . L3-L4, L4-L5 lumbar decompression  2011  . Right shoulder cyst excision  2010  . SPINE SURGERY     twice, approx 2006, 2011      Chief Complaint  Patient presents with  . Emesis      HPI:    Stacy Meyers  is a 86 y.o. female with past medical history relevant for HFpEF, HLD, HTN, rheumatoid arthritis on methotrexate and prednisone therapy chronically presents to the ER with nausea vomiting and diarrhea of less than 36 hours duration  --- In the ED patient is found to be tachypneic, tachycardic and dehydrated--- UA was suspicious for UTI --Patient's recent urine culture from February 2021 grew pansensitive E. Coli -CT abdomen shows Moderate right-sided urinary tract obstruction secondary to a proximal right ureteric 1.4 cm calculus.  Bilateral nephrolithiasis.  -EDP consulted urologist Dr. Claudia Desanctis, requested hospitalist admit to [facility] long complex for further urological intervention for complicated UTI with obstructive uropathy and sepsis  patient met sepsis criteria on admission with WBC 13.5, heart rate 104, respiratory rate 31, hypotension with systolic blood pressure in the 70s to 80s, lactic acid elevated to 4.2 initially and acute kidney injury with creatinine up to 1.8 from a baseline of 0.8---after IV fluid boluses per sepsis protocol repeat lactic acid normalized at 0.7, heart rate  improved/tachycardia resolved, BP improved with systolic blood pressure around 100    Review of systems:    In addition to the HPI above,   A full Review of  Systems was done, all other systems reviewed are negative except as noted above in HPI , .    Social History:  Reviewed by me    Social History   Tobacco Use  . Smoking status: Never Smoker  . Smokeless tobacco: Never Used  . Tobacco comment: Second hand smoke   Substance Use Topics  . Alcohol use: No       Family History :  Reviewed by me    Family History  Problem Relation Age of Onset  . Stroke Mother   . Cancer Father   . Diabetes Sister   . Diabetes Sister   . Kidney failure Brother   . Kidney failure Brother   . Pancreatic cancer Sister   . Kidney Stones Sister        Only one Kidney  . Arthritis Sister   . Heart disease Daughter      Home Medications:   Prior to Admission medications  Medication Sig Start Date End Date Taking? Authorizing Provider  acetaminophen (TYLENOL) 500 MG tablet Take 1,000 mg by mouth every 6 (six) hours as needed. Reported on 05/28/2015   Yes [provider]  acyclovir (ZOVIRAX) 400 MG tablet TAKE 1 TABLET BY MOUTH TWICE A DAY 09/20/18  Yes Fayrene Helper, MD  alendronate (FOSAMAX) 70 MG tablet TAKE 1 TABLET BY MOUTH EVERY 7 DAYS. TAKE WITH A FULL GLASS OF WATER ON AN EMPTY STOMACH. 01/16/19  Yes Fayrene Helper, MD  amLODipine (NORVASC) 5 MG tablet TAKE 1 TABLET BY MOUTH EVERY DAY 02/22/19  Yes Fayrene Helper, MD  azelastine (ASTELIN) 0.1 % nasal spray Place 2 sprays into both nostrils 2 (two) times daily. Use in each nostril as directed Patient taking differently: Place 2 sprays into both nostrils as needed. Use in each nostril as directed 12/05/15  Yes Fayrene Helper, MD  folic acid (FOLVITE) 1 MG tablet TAKE 1 TABLET BY MOUTH EVERY DAY 05/27/18  Yes Deveshwar, Abel Presto, MD  KLOR-CON M20 20 MEQ tablet TAKE 1 TABLET (20 MEQ TOTAL) BY MOUTH DAILY.  03/29/19  Yes Perlie Mayo, NP  methotrexate (RHEUMATREX) 2.5 MG tablet TAKE 3 TABLETS BY MOUTH ONCE A WEEK. TAKE AS DIRECTED CAUTION:CHEMOTHERAPY. PROTECT FROM LIGHT 03/21/19  Yes Deveshwar, Abel Presto, MD  montelukast (SINGULAIR) 10 MG tablet TAKE 1 TABLET BY MOUTH EVERYDAY AT BEDTIME 02/09/19  Yes Fayrene Helper, MD  predniSONE (DELTASONE) 1 MG tablet TAKE 4 TABLETS (4 MG TOTAL) BY MOUTH DAILY WITH BREAKFAST. 03/27/19  Yes Deveshwar, Abel Presto, MD  rosuvastatin (CRESTOR) 5 MG tablet TAKE 1 TABLET BY MOUTH EVERY OTHER DAY 02/22/19  Yes Fayrene Helper, MD  UNABLE TO FIND Diabetic shoes x 1 Inserts x 3  DX E11.9 11/09/16   Fayrene Helper, MD  UNABLE TO FIND Diabetic shoes x 1 inserts x 3  Dx e11.9 08/20/17   Fayrene Helper, MD     Allergies:     Allergies  Allergen Reactions  . Aspirin Other (See Comments)    REACTION: burns stomach (uncoated)  . Sudafed [Pseudoephedrine Hcl] Other (See Comments)    Increased joint pain    Physical Exam:   Vitals  Blood pressure (!) 99/50, pulse 70, temperature 98 F (36.7 C), temperature source Oral, resp. rate 16, height '4\' 11"'  (1.499 m), weight 63.5 kg, SpO2 94 %.  Physical Examination: General appearance - alert, ill appearing, and in no distress  Mental status - alert, oriented to person, place, and time,  Eyes - sclera anicteric Neck - supple, no JVD elevation , Chest - clear  to auscultation bilaterally, symmetrical air movement,  Heart - S1 and S2 normal, regular  Abdomen - soft, nontender, nondistended, no masses or organomegaly, no significant CVA tenderness Neurological - screening mental status exam normal, neck supple without rigidity, cranial nerves II through XII intact, DTR's normal and symmetric Extremities - no pedal edema noted, intact peripheral pulses  Skin - warm, dry   Data Review:    CBC Recent Labs  Lab 04/03/19 0604  WBC 13.5*  HGB 10.3*  HCT 32.8*  PLT 129*  MCV 101.2*  MCH 31.8  MCHC 31.4  RDW  14.2  LYMPHSABS 0.5*  MONOABS 0.3  EOSABS 0.0  BASOSABS 0.0   ------------------------------------------------------------------------------------------------------------------  Chemistries  Recent Labs  Lab 04/03/19 0604  NA 139  K 3.1*  CL 108  CO2 22  GLUCOSE 141*  BUN 31*  CREATININE 1.86*  CALCIUM 8.3*  AST 21  ALT 13  ALKPHOS 83  BILITOT 0.8   ------------------------------------------------------------------------------------------------------------------ estimated creatinine clearance is 17.9 mL/min (A) (by C-G formula based on SCr of 1.86 mg/dL (H)). ------------------------------------------------------------------------------------------------------------------ No results for input(s): TSH, T4TOTAL, T3FREE, THYROIDAB in the last 72 hours.  Invalid input(s): FREET3   Coagulation profile Recent Labs  Lab 04/03/19 0708  INR 1.5*   ------------------------------------------------------------------------------------------------------------------- No results for input(s): DDIMER in the last 72 hours. -------------------------------------------------------------------------------------------------------------------  Cardiac Enzymes No results for input(s): CKMB, TROPONINI, MYOGLOBIN in the last 168 hours.  Invalid input(s): CK ------------------------------------------------------------------------------------------------------------------ No results found for: BNP  Urinalysis    Component Value Date/Time   COLORURINE AMBER (A) 04/03/2019 0523   APPEARANCEUR CLOUDY (A) 04/03/2019 0523   LABSPEC 1.017 04/03/2019 0523   PHURINE 5.0 04/03/2019 0523   GLUCOSEU NEGATIVE 04/03/2019 0523   HGBUR SMALL (A) 04/03/2019 0523   BILIRUBINUR NEGATIVE 04/03/2019 0523   BILIRUBINUR small (A) 02/06/2019 1616   BILIRUBINUR neg 12/04/2013 0920   KETONESUR NEGATIVE 04/03/2019 0523   PROTEINUR 100 (A) 04/03/2019 0523   UROBILINOGEN 0.2 02/06/2019 1616   UROBILINOGEN  0.2 06/01/2012 1140   NITRITE NEGATIVE 04/03/2019 0523   LEUKOCYTESUR LARGE (A) 04/03/2019 0523     Imaging Results:    DG Chest Port 1 View  Result Date: 04/03/2019 CLINICAL DATA:  Nausea, vomiting and diarrhea. EXAM: PORTABLE CHEST 1 VIEW COMPARISON:  06/17/2015 FINDINGS: The cardiac silhouette, mediastinal and hilar contours are within normal limits and stable given the AP projection and portable technique. Stable mild tortuosity and calcification of the thoracic aorta. Low lung volumes with streaky basilar atelectasis. Mild eventration of the right hemidiaphragm. The bony thorax is intact. Stable advanced degenerative changes involving both shoulders. IMPRESSION: Low lung volumes with streaky bibasilar atelectasis. No infiltrates, edema or effusions. Electronically Signed   By: Marijo Sanes M.D.   On: 04/03/2019 07:09   CT Renal Stone Study  Result Date: 04/03/2019 CLINICAL DATA:  Abdominal pain and nausea for 1 day. EXAM: CT ABDOMEN AND PELVIS WITHOUT CONTRAST TECHNIQUE: Multidetector CT imaging of the abdomen and pelvis was performed following the standard protocol without IV contrast. COMPARISON:  05/25/2016 FINDINGS: Lower chest: Dependent bibasilar atelectasis. Left lower lobe calcified granuloma. Tiny bilateral pleural effusions. Mild cardiomegaly. Multivessel coronary artery atherosclerosis. Hepatobiliary: High left hepatic lobe cyst. Cholecystectomy, without biliary ductal dilatation. Mild motion degradation in the lower chest and abdomen. Pancreas: Grossly normal pancreas for age. Spleen: Normal in size, without focal abnormality. Adrenals/Urinary Tract: Normal adrenal glands. Small bilateral renal collecting system calculi. Interpolar left renal 2.4 cm cyst. Moderate right-sided hydroureteronephrosis to the level of a proximal right ureteric 1.0 x 1.4 cm stone, including on coronal image 46 transverse image 46. Perinephric edema and fluid, suggesting forniceal rupture. No bladder calculi.  Stomach/Bowel: Tiny hiatal hernia. Extensive colonic diverticulosis. Lipoma within the ascending colon at 2.8 cm on 41/2. Normal terminal ileum and appendix. Normal small bowel. Vascular/Lymphatic: Aortic atherosclerosis. No abdominopelvic adenopathy. Reproductive: Fibroid uterus.  No adnexal mass. Other: No significant free fluid. Fat containing ventral abdominal wall hernia. Musculoskeletal: Advanced lumbosacral spondylosis. Grade 1-2 L4-5 anterolisthesis. IMPRESSION: 1. Moderate right-sided urinary tract obstruction secondary to a proximal right ureteric 1.4 cm calculus. 2. Bilateral nephrolithiasis. 3. Tiny bilateral pleural effusions. 4. Fibroid uterus. 5. Coronary artery atherosclerosis. Aortic Atherosclerosis (ICD10-I70.0). 6. Ascending colonic lipoma, without obstruction. Electronically Signed   By: Abigail Miyamoto M.D.   On: 04/03/2019 09:15    Radiological Exams on Admission: [facility] 1 View  Result  Date: 04/03/2019 CLINICAL DATA:  Nausea, vomiting and diarrhea. EXAM: PORTABLE CHEST 1 VIEW COMPARISON:  06/17/2015 FINDINGS: The cardiac silhouette, mediastinal and hilar contours are within normal limits and stable given the AP projection and portable technique. Stable mild tortuosity and calcification of the thoracic aorta. Low lung volumes with streaky basilar atelectasis. Mild eventration of the right hemidiaphragm. The bony thorax is intact. Stable advanced degenerative changes involving both shoulders. IMPRESSION: Low lung volumes with streaky bibasilar atelectasis. No infiltrates, edema or effusions. Electronically Signed   By: Marijo Sanes M.D.   On: 04/03/2019 07:09   CT Renal Stone Study  Result Date: 04/03/2019 CLINICAL DATA:  Abdominal pain and nausea for 1 day. EXAM: CT ABDOMEN AND PELVIS WITHOUT CONTRAST TECHNIQUE: Multidetector CT imaging of the abdomen and pelvis was performed following the standard protocol without IV contrast. COMPARISON:  05/25/2016 FINDINGS: Lower chest:  Dependent bibasilar atelectasis. Left lower lobe calcified granuloma. Tiny bilateral pleural effusions. Mild cardiomegaly. Multivessel coronary artery atherosclerosis. Hepatobiliary: High left hepatic lobe cyst. Cholecystectomy, without biliary ductal dilatation. Mild motion degradation in the lower chest and abdomen. Pancreas: Grossly normal pancreas for age. Spleen: Normal in size, without focal abnormality. Adrenals/Urinary Tract: Normal adrenal glands. Small bilateral renal collecting system calculi. Interpolar left renal 2.4 cm cyst. Moderate right-sided hydroureteronephrosis to the level of a proximal right ureteric 1.0 x 1.4 cm stone, including on coronal image 46 transverse image 46. Perinephric edema and fluid, suggesting forniceal rupture. No bladder calculi. Stomach/Bowel: Tiny hiatal hernia. Extensive colonic diverticulosis. Lipoma within the ascending colon at 2.8 cm on 41/2. Normal terminal ileum and appendix. Normal small bowel. Vascular/Lymphatic: Aortic atherosclerosis. No abdominopelvic adenopathy. Reproductive: Fibroid uterus.  No adnexal mass. Other: No significant free fluid. Fat containing ventral abdominal wall hernia. Musculoskeletal: Advanced lumbosacral spondylosis. Grade 1-2 L4-5 anterolisthesis. IMPRESSION: 1. Moderate right-sided urinary tract obstruction secondary to a proximal right ureteric 1.4 cm calculus. 2. Bilateral nephrolithiasis. 3. Tiny bilateral pleural effusions. 4. Fibroid uterus. 5. Coronary artery atherosclerosis. Aortic Atherosclerosis (ICD10-I70.0). 6. Ascending colonic lipoma, without obstruction. Electronically Signed   By: Abigail Miyamoto M.D.   On: 04/03/2019 09:15   DVT Prophylaxis -SCD AM Labs Ordered, also please review Full Orders  Family Communication: Admission, patients condition and plan of care including tests being ordered have been discussed with the patient  And Grand-daughter---[phone]- who indicate understanding and agree with the plan   Code  Status - Full Code   Likely DC to home with Family  Condition   Stable  Roxan Hockey M.D on 04/03/2019 at 12:58 PM Go to [email or web address] -  for contact info  [facility] - Office  [phone]

## 2019-04-03 NOTE — Interval H&P Note (Signed)
History and Physical Interval Note:  04/03/2019 12:22 PM  Stacy Meyers  has presented today for surgery, with the diagnosis of right obstructed ureteral calculi.  The various methods of treatment have been discussed with the patient and family. After consideration of risks, benefits and other options for treatment, the patient has consented to  Procedure(s): CYSTOSCOPY WITH STENT PLACEMENT (Right) as a surgical intervention.  The patient's history has been reviewed, patient examined, no change in status, stable for surgery.  I have reviewed the patient's chart and labs.  Questions were answered to the patient's satisfaction.     Dallon Dacosta D Laverne Klugh

## 2019-04-04 ENCOUNTER — Telehealth: Payer: Self-pay | Admitting: *Deleted

## 2019-04-04 LAB — BLOOD CULTURE ID PANEL (REFLEXED)

## 2019-04-04 LAB — URINE CULTURE: Culture: <10000 — AB

## 2019-04-04 LAB — BASIC METABOLIC PANEL
Anion gap: 8 (ref 5–15)
BUN: 27 mg/dL — ABNORMAL HIGH (ref 8–23)
CO2: 18 mmol/L — ABNORMAL LOW (ref 22–32)
Calcium: 8.3 mg/dL — ABNORMAL LOW (ref 8.9–10.3)
Chloride: 113 mmol/L — ABNORMAL HIGH (ref 98–111)
Creatinine, Ser: 1.3 mg/dL — ABNORMAL HIGH (ref 0.44–1.00)
GFR calc Af Amer: 43 mL/min — ABNORMAL LOW (ref >60)
GFR calc non Af Amer: 37 mL/min — ABNORMAL LOW (ref >60)
Glucose, Bld: 138 mg/dL — ABNORMAL HIGH (ref 70–99)
Potassium: 4.3 mmol/L (ref 3.5–5.1)
Sodium: 139 mmol/L (ref 135–145)

## 2019-04-04 LAB — GLUCOSE, CAPILLARY
Glucose-Capillary: 113 mg/dL — ABNORMAL HIGH (ref 70–99)
Glucose-Capillary: 131 mg/dL — ABNORMAL HIGH (ref 70–99)
Glucose-Capillary: 128 mg/dL — ABNORMAL HIGH (ref 70–99)
Glucose-Capillary: 122 mg/dL — ABNORMAL HIGH (ref 70–99)

## 2019-04-04 LAB — CBC
HCT: 33.7 % — ABNORMAL LOW (ref 36.0–46.0)
Hemoglobin: 10.7 g/dL — ABNORMAL LOW (ref 12.0–15.0)
MCH: 31.6 pg (ref 26.0–34.0)
MCHC: 31.8 g/dL (ref 30.0–36.0)
MCV: 99.4 fL (ref 80.0–100.0)
Platelets: 135 10*3/uL — ABNORMAL LOW (ref 150–400)
RBC: 3.39 MIL/uL — ABNORMAL LOW (ref 3.87–5.11)
RDW: 14.6 % (ref 11.5–15.5)
WBC: 20.6 10*3/uL — ABNORMAL HIGH (ref 4.0–10.5)
nRBC: 0 % (ref 0.0–0.2)

## 2019-04-04 LAB — PHOSPHORUS: Phosphorus: 2.4 mg/dL — ABNORMAL LOW (ref 2.5–4.6)

## 2019-04-04 LAB — MAGNESIUM: Magnesium: 3 mg/dL — ABNORMAL HIGH (ref 1.7–2.4)

## 2019-04-04 MED ORDER — LOPERAMIDE HCL 2 MG PO CAPS
2.0000 mg | ORAL_CAPSULE | Freq: Four times a day (QID) | ORAL | Status: DC | PRN
  Administered 2019-04-06: 2 mg via ORAL
  Filled 2019-04-04: qty 1

## 2019-04-04 MED ORDER — SODIUM CHLORIDE 0.9 % IV SOLN
2.0000 g | Freq: Every day | INTRAVENOUS | Status: DC
  Administered 2019-04-04 – 2019-04-06 (×3): 2 g via INTRAVENOUS
  Filled 2019-04-04: qty 20
  Filled 2019-04-04: qty 2
  Filled 2019-04-04 (×2): qty 20

## 2019-04-04 MED ORDER — SODIUM CHLORIDE 0.9 % IV SOLN: Freq: Once | INTRAVENOUS | Status: AC

## 2019-04-04 MED ORDER — IPRATROPIUM-ALBUTEROL 0.5-2.5 (3) MG/3ML IN SOLN
3.0000 mL | RESPIRATORY_TRACT | Status: DC | PRN
  Administered 2019-04-04: 3 mL via RESPIRATORY_TRACT
  Filled 2019-04-04: qty 3

## 2019-04-04 NOTE — Progress Notes (Signed)
Urology Inpatient Progress Report  Ureteral stone [N20.1] Acute cystitis without hematuria [N30.00] Sepsis ([place]) [A41.9] Hypotension, unspecified hypotension type [I95.9]  Procedure(s): CYSTOSCOPY, RIGHT RETROGRADE PYELOGRAM  WITH STENT PLACEMENT  1 Day Post-Op   Intv/Subj: No acute events overnight.  Patient is without complaint.  Principal Problem:   Sepsis secondary to UTI /Nephrolithiasis with obstructive uropathy Active Problems:   Rheumatoid arthritis with rheumatoid factor of multiple sites without organ or systems involvement (HCC)   Type 2 diabetes, diet controlled (HCC)   Right nephrolithiasis-1.4cm Moderate right-sided urinary tract obstruction/hydronephrosis   Rt Sided Acute unilateral obstructive uropathy- 1.4 cm Prox Ureteric Stone   H/o Chronic heart failure with preserved ejection fraction (HFpEF) ([place])  Current Facility-Administered Medications  Medication Dose Route Frequency Provider Last Rate Last Admin  . 0.9 %  sodium chloride infusion  250 mL Intravenous PRN Emokpae, Courage, MD      . 0.9 %  sodium chloride infusion   Intravenous Once Terrilee Croak, MD 50 mL/hr at 04/04/19 1426 New Bag at 04/04/19 1426  . acetaminophen (TYLENOL) tablet 650 mg  650 mg Oral Q6H PRN Emokpae, Courage, MD       Or  . acetaminophen (TYLENOL) suppository 650 mg  650 mg Rectal Q6H PRN Emokpae, Courage, MD      . acyclovir (ZOVIRAX) tablet 400 mg  400 mg Oral BID Denton Brick, Courage, MD   400 mg at 04/04/19 0906  . cefTRIAXone (ROCEPHIN) 2 g in sodium chloride 0.9 % 100 mL IVPB  2 g Intravenous Q0600 Dorrene German, RPH 200 mL/hr at 04/04/19 0518 2 g at 04/04/19 0518  . folic acid (FOLVITE) tablet 1 mg  1 mg Oral Daily Emokpae, Courage, MD   1 mg at 04/04/19 0906  . insulin aspart (novoLOG) injection 0-5 Units  0-5 Units Subcutaneous QHS Emokpae, Courage, MD      . insulin aspart (novoLOG) injection 0-6 Units  0-6 Units Subcutaneous TID WC Emokpae, Courage, MD      . lactated  ringers 1,000 mL with potassium chloride 10 mEq infusion   Intravenous Continuous Irene Pap N, DO   Stopped at 04/04/19 1426  . loperamide (IMODIUM) capsule 2 mg  2 mg Oral Q6H PRN Dahal, Binaya, MD      . montelukast (SINGULAIR) tablet 5 mg  5 mg Oral QHS Emokpae, Courage, MD   5 mg at 04/03/19 2215  . ondansetron (ZOFRAN) tablet 4 mg  4 mg Oral Q6H PRN Emokpae, Courage, MD       Or  . ondansetron (ZOFRAN) injection 4 mg  4 mg Intravenous Q6H PRN Emokpae, Courage, MD      . polyethylene glycol (MIRALAX / GLYCOLAX) packet 17 g  17 g Oral Daily PRN Emokpae, Courage, MD      . potassium chloride SA (KLOR-CON) CR tablet 20 mEq  20 mEq Oral Daily Emokpae, Courage, MD   20 mEq at 04/04/19 0906  . predniSONE (DELTASONE) tablet 10 mg  10 mg Oral Q breakfast Denton Brick, Courage, MD   10 mg at 04/04/19 0906  . rosuvastatin (CRESTOR) tablet 5 mg  5 mg Oral Lovena Le, Courage, MD   5 mg at 04/04/19 0906  . sodium chloride flush (NS) 0.9 % injection 3 mL  3 mL Intravenous Q12H Emokpae, Courage, MD      . sodium chloride flush (NS) 0.9 % injection 3 mL  3 mL Intravenous PRN Emokpae, Courage, MD      . traZODone (DESYREL) tablet 50 mg  50 mg Oral QHS PRN Roxan Hockey, MD         Objective: Vital: Vitals:   04/04/19 0242 04/04/19 0319 04/04/19 0633 04/04/19 1441  BP: 126/88  (!) 149/84 (!) 149/84  Pulse: 64  66 68  Resp: 18  18 16   Temp: 97.9 F (36.6 C)  97.7 F (36.5 C) (!) 97.5 F (36.4 C)  TempSrc: Oral  Oral Oral  SpO2: 94%  94% 96%  Weight:  67.9 kg    Height:       I/Os: I/O last 3 completed shifts: In: 2768.4 [I.V.:1891.1; IV Piggyback:877.3] Out: 1700 [Urine:1700]  Physical Exam:  General: Patient is in no apparent distress Lungs: Normal respiratory effort, chest expands symmetrically. GI:  The abdomen is soft and nontender without mass. Ext: lower extremities symmetric  Lab Results: Recent Labs    04/03/19 0604 04/04/19 0518  WBC 13.5* 20.6*  HGB 10.3* 10.7*  HCT  32.8* 33.7*   Recent Labs    04/03/19 0604 04/04/19 0518  NA 139 139  K 3.1* 4.3  CL 108 113*  CO2 22 18*  GLUCOSE 141* 138*  BUN 31* 27*  CREATININE 1.86* 1.30*  CALCIUM 8.3* 8.3*   Recent Labs    04/03/19 0708  INR 1.5*   No results for input(s): LABURIN in the last 72 hours. Results for orders placed or performed during the hospital encounter of 04/03/19  Urine culture     Status: Abnormal (Preliminary result)   Collection Time: 04/03/19  5:23 AM   Specimen: Urine, Clean Catch  Result Value Ref Range Status   Specimen Description   Final    URINE, CLEAN CATCH Performed at [facility], [address] [place], [place] [address]    Special Requests   Final    NONE Performed at [facility], [address] [place], [place] [address]    Culture (A)  Final    >=100,000 COLONIES/mL ESCHERICHIA COLI SUSCEPTIBILITIES TO FOLLOW CULTURE REINCUBATED FOR BETTER GROWTH Performed at [facility] [address] [place], [place] [address]    Report Status PENDING  Incomplete  Blood Culture (routine x 2)     Status: None (Preliminary result)   Collection Time: 04/03/19  7:08 AM   Specimen: BLOOD RIGHT ARM  Result Value Ref Range Status   Specimen Description BLOOD RIGHT ARM  Final   Special Requests   Final    BOTTLES DRAWN AEROBIC AND ANAEROBIC Blood Culture adequate volume Performed at [facility], [address] [place],  [address]    Culture  Setup Time   Final    IN BOTH AEROBIC AND ANAEROBIC BOTTLES GRAM NEGATIVE RODS Gram Stain Report Called to,Read Back By and Verified With: H PHILLIPS,RN @2309  04/03/19 MKELLY CRITICAL RESULT CALLED TO, READ BACK BY AND VERIFIED WITH: B. GREEN,PHARMD 0253 04/04/2019 T. TYSOR    Culture GRAM NEGATIVE RODS  Final   Report Status PENDING  Incomplete  Blood Culture (routine x 2)     Status: None (Preliminary result)   Collection Time: 04/03/19  7:08 AM   Specimen: BLOOD RIGHT HAND  Result Value Ref Range Status    Specimen Description BLOOD RIGHT HAND  Final   Special Requests   Final    BOTTLES DRAWN AEROBIC ONLY Blood Culture results may not be optimal due to an inadequate volume of blood received in culture bottles   Culture   Final    NO GROWTH 1 DAY Performed at [facility], [address] [place], [place] [address]    Report Status PENDING  Incomplete  Blood Culture ID Panel (Reflexed)     Status: Abnormal   Collection Time: 04/03/19  7:08 AM  Result Value Ref Range Status   Enterococcus species NOT DETECTED NOT DETECTED Final   Listeria monocytogenes NOT DETECTED NOT DETECTED Final   Staphylococcus species NOT DETECTED NOT DETECTED Final   Staphylococcus aureus (BCID) NOT DETECTED NOT DETECTED Final   Streptococcus species NOT DETECTED NOT DETECTED Final   Streptococcus agalactiae NOT DETECTED NOT DETECTED Final   Streptococcus pneumoniae NOT DETECTED NOT DETECTED Final   Streptococcus pyogenes NOT DETECTED NOT DETECTED Final   Acinetobacter baumannii NOT DETECTED NOT DETECTED Final   Enterobacteriaceae species DETECTED (A) NOT DETECTED Final    Comment: Enterobacteriaceae represent a large family of gram-negative bacteria, not a single organism. CRITICAL RESULT CALLED TO, READ BACK BY AND VERIFIED WITH: B. GREEN,PHARMD 0253 04/04/2019 T. TYSOR    Enterobacter cloacae complex NOT DETECTED NOT DETECTED Final   Escherichia coli DETECTED (A) NOT DETECTED Final    Comment: CRITICAL RESULT CALLED TO, READ BACK BY AND VERIFIED WITH: B. GREEN,PHARMD 0253 04/04/2019 T. TYSOR    Klebsiella oxytoca NOT DETECTED NOT DETECTED Final   Klebsiella pneumoniae NOT DETECTED NOT DETECTED Final   Proteus species NOT DETECTED NOT DETECTED Final   Serratia marcescens NOT DETECTED NOT DETECTED Final   Carbapenem resistance NOT DETECTED NOT DETECTED Final   Haemophilus influenzae NOT DETECTED NOT DETECTED Final   Neisseria meningitidis NOT DETECTED NOT DETECTED Final   Pseudomonas aeruginosa NOT DETECTED  NOT DETECTED Final   Candida albicans NOT DETECTED NOT DETECTED Final   Candida glabrata NOT DETECTED NOT DETECTED Final   Candida krusei NOT DETECTED NOT DETECTED Final   Candida parapsilosis NOT DETECTED NOT DETECTED Final   Candida tropicalis NOT DETECTED NOT DETECTED Final  Respiratory Panel by RT PCR (Flu A&B, Covid) - Nasopharyngeal Swab     Status: None   Collection Time: 04/03/19  8:26 AM   Specimen: Nasopharyngeal Swab  Result Value Ref Range Status   SARS Coronavirus 2 by RT PCR NEGATIVE NEGATIVE Final    Comment: (NOTE) SARS-CoV-2 target nucleic acids are NOT DETECTED. The SARS-CoV-2 RNA is generally detectable in upper respiratoy specimens during the acute phase of infection. The lowest concentration of SARS-CoV-2 viral copies this assay can detect is 131 copies/mL. A negative result does not preclude SARS-Cov-2 infection and should not be used as the sole basis for treatment or other patient management decisions. A negative result may occur with  improper specimen collection/handling, submission of specimen other than nasopharyngeal swab, presence of viral mutation(s) within the areas targeted by this assay, and inadequate number of viral copies (<131 copies/mL). A negative result must be combined with clinical observations, patient history, and epidemiological information. The expected result is Negative. Fact Sheet for Patients:  PinkCheek.be Fact Sheet for Healthcare Providers:  GravelBags.it This test is not yet ap proved or cleared by the [place] FDA and  has been authorized for detection and/or diagnosis of SARS-CoV-2 by FDA under an Emergency Use Authorization (EUA). This EUA will remain  in effect (meaning this test can be used) for the duration of the COVID-19 declaration under Section 564(b)(1) of the Act, 21 U.S.C. section 360bbb-3(b)(1), unless the authorization is terminated or revoked  sooner.    Influenza A by PCR NEGATIVE NEGATIVE Final   Influenza B by PCR NEGATIVE NEGATIVE Final    Comment: (NOTE) The  Xpert Xpress SARS-CoV-2/FLU/RSV assay is intended as an aid in  the diagnosis of influenza from Nasopharyngeal swab specimens and  should not be used as a sole basis for treatment. Nasal washings and  aspirates are unacceptable for Xpert Xpress SARS-CoV-2/FLU/RSV  testing. Fact Sheet for Patients: PinkCheek.be Fact Sheet for Healthcare Providers: GravelBags.it This test is not yet approved or cleared by the [place] FDA and  has been authorized for detection and/or diagnosis of SARS-CoV-2 by  FDA under an Emergency Use Authorization (EUA). This EUA will remain  in effect (meaning this test can be used) for the duration of the  Covid-19 declaration under Section 564(b)(1) of the Act, 21  U.S.C. section 360bbb-3(b)(1), unless the authorization is  terminated or revoked. Performed at [facility], [address] [place], [place] [address]   Urine Culture     Status: None (Preliminary result)   Collection Time: 04/03/19 12:32 PM   Specimen: Urine, Clean Catch  Result Value Ref Range Status   Specimen Description   Final    URINE, CLEAN CATCH Performed at [facility], [place] [address], [place], [place] [address]    Special Requests   Final    NONE Performed at [facility], [place] [address] [place], [place] [address]    Culture PENDING  Incomplete   Report Status PENDING  Incomplete    Studies/Results: DG Chest Port 1 View  Result Date: 04/03/2019 CLINICAL DATA:  Nausea, vomiting and diarrhea. EXAM: PORTABLE CHEST 1 VIEW COMPARISON:  06/17/2015 FINDINGS: The cardiac silhouette, mediastinal and hilar contours are within normal limits and stable given the AP projection and portable technique. Stable mild tortuosity and calcification of the thoracic aorta. Low lung volumes with  streaky basilar atelectasis. Mild eventration of the right hemidiaphragm. The bony thorax is intact. Stable advanced degenerative changes involving both shoulders. IMPRESSION: Low lung volumes with streaky bibasilar atelectasis. No infiltrates, edema or effusions. Electronically Signed   By: Marijo Sanes M.D.   On: 04/03/2019 07:09   DG C-Arm 1-60 Min-No Report  Result Date: 04/03/2019 Fluoroscopy was utilized by the requesting physician.  No radiographic interpretation.   CT Renal Stone Study  Result Date: 04/03/2019 CLINICAL DATA:  Abdominal pain and nausea for 1 day. EXAM: CT ABDOMEN AND PELVIS WITHOUT CONTRAST TECHNIQUE: Multidetector CT imaging of the abdomen and pelvis was performed following the standard protocol without IV contrast. COMPARISON:  05/25/2016 FINDINGS: Lower chest: Dependent bibasilar atelectasis. Left lower lobe calcified granuloma. Tiny bilateral pleural effusions. Mild cardiomegaly. Multivessel coronary artery atherosclerosis. Hepatobiliary: High left hepatic lobe cyst. Cholecystectomy, without biliary ductal dilatation. Mild motion degradation in the lower chest and abdomen. Pancreas: Grossly normal pancreas for age. Spleen: Normal in size, without focal abnormality. Adrenals/Urinary Tract: Normal adrenal glands. Small bilateral renal collecting system calculi. Interpolar left renal 2.4 cm cyst. Moderate right-sided hydroureteronephrosis to the level of a proximal right ureteric 1.0 x 1.4 cm stone, including on coronal image 46 transverse image 46. Perinephric edema and fluid, suggesting forniceal rupture. No bladder calculi. Stomach/Bowel: Tiny hiatal hernia. Extensive colonic diverticulosis. Lipoma within the ascending colon at 2.8 cm on 41/2. Normal terminal ileum and appendix. Normal small bowel. Vascular/Lymphatic: Aortic atherosclerosis. No abdominopelvic adenopathy. Reproductive: Fibroid uterus.  No adnexal mass. Other: No significant free fluid. Fat containing ventral  abdominal wall hernia. Musculoskeletal: Advanced lumbosacral spondylosis. Grade 1-2 L4-5 anterolisthesis. IMPRESSION: 1. Moderate right-sided urinary tract obstruction secondary to a proximal right ureteric 1.4 cm calculus. 2. Bilateral nephrolithiasis. 3. Tiny  bilateral pleural effusions. 4. Fibroid uterus. 5. Coronary artery atherosclerosis. Aortic Atherosclerosis (ICD10-I70.0). 6. Ascending colonic lipoma, without obstruction. Electronically Signed   By: Abigail Miyamoto M.D.   On: 04/03/2019 09:15    Assessment: Procedure(s): 86 year old woman who presented to the ER with nausea found to have 1.5 cm right proximal obstructing stone associated with infection and sepsis now s/p CYSTOSCOPY, RIGHT RETROGRADE PYELOGRAM  WITH STENT PLACEMENT, 1 Day Post-Op  doing well.  Plan: -Patient will need urology follow-up in the office in approximately 1 week to schedule definitive stone surgery -She will continue with the ureteral stent until her next surgery -Blood in the urine as expected along with overactive bladder -Recommend tailoring antibiotics to urine culture results   Jacalyn Lefevre, MD Urology 04/04/2019, 4:49 PM

## 2019-04-04 NOTE — Progress Notes (Signed)
PROGRESS NOTE  Stacy Meyers  DOB: 03/05/1932  PCP: Fayrene Helper, MD CWU:[id number]  DOA: 04/03/2019  LOS: 1 day   Chief Complaint  Patient presents with  . Emesis   Brief narrative: Stacy Meyers a87 y.o.femalewith past medical history relevant for HFpEF,HLD, HTN, rheumatoid arthritis on methotrexate and prednisone therapy chronically. Patient presented to the ED from home on 3/29 with complaint of nausea, vomiting and diarrhea of less than 36 hours duration.  In the ED patient is found to be tachypneic, tachycardic and dehydrated. WBC count elevated to 13.5, lactic acid level was initially elevated to 4.2, creatinine elevated to 1.2, baseline 0.8 UA was suspicious for UTI CT abdomen showed moderate right-sided urinary tract obstruction secondary to a proximal right ureteric 1.4 cm calculus. Bilateral nephrolithiasis.  Patient was admitted to hospitalist service. Urology consultation was obtained.  Patient underwent cystoscopy, right retrograde pyelogram which showed hydronephrosis, patient had a right ureteral stent placement as well.   Subjective: Patient was seen and examined this morning.  Patient elderly African-American female.  Lying down in bed.  Not in physical distress.  He complains of multiple episodes of diarrhea in last 24 hours which has not improved.  Assessment/Plan: Sepsis secondary to complicated UTI Obstructive uropathy -Met sepsis criteria on admission with tachycardia, leukocytosis, lactic acidosis and source. -Started on broad-spectrum IV antibiotics. -Underwent right cystoscopy with stent placement. -Sepsis parameters improving.  Overall.  No fever.  Lactic acid level improved.  Although WBC count is high today.  We will continue to monitor.  Continue IV Rocephin. -I am not clear at this time why the patient is on acyclovir at home.  Acute nausea, vomiting, diarrhea -Unclear if this is related to sepsis secondary to UTI or if there is a  separate GI process going on.   -CT urogram also noted extensive colonic diverticulosis but no other acute intestinal issues. -GI pathogen panel sent.  Pending report.  Currently on IV Rocephin. -Patient wants some help with diarrhea.  Will order for low dose Imodium as needed.  Acute kidney injury -Creatinine elevated to 1.8, at baseline, ED 0.8. -With IV hydration, creatinine level is improving, 1.3 today.  Continue hydration.  Diabetes mellitus 2 -A1c 6.3 -Not on medications at home for diabetes. -Continue sliding scale insulin with Accu-Cheks.  h/o HTN -Blood pressure was initially running low.  Of sepsis.  Amlodipine is on hold.  Continue to monitor blood pressure for now.  Rheumatoid arthritis -Continue prednisone.  Keep methotrexate on hold while septic.    HLD -stable, continue Crestor  Mobility: Pending PT eval Code Status:  Full code  DVT prophylaxis:  SCDs Antimicrobials:  IV Rocephin, IV acyclovir Fluid: Normal saline at 50 mils per hour Diet: Regular diet  Family Communication:  Not at bedside Admitted From: Home Anticipated date and disposition: Pending PT eval Barriers: Improving sepsis, pending culture report.  Consultants:  Urology     Antimicrobials: Anti-infectives (From admission, onward)   Start     Dose/Rate Route Frequency Ordered Stop   04/04/19 0500  cefTRIAXone (ROCEPHIN) 2 g in sodium chloride 0.9 % 100 mL IVPB     2 g 200 mL/hr over 30 Minutes Intravenous Daily 04/04/19 0312     04/03/19 1800  acyclovir (ZOVIRAX) tablet 400 mg     400 mg Oral 2 times daily 04/03/19 1243     04/03/19 1600  piperacillin-tazobactam (ZOSYN) IVPB 2.25 g  Status:  Discontinued     2.25 g 100 mL/hr over 30 Minutes  Intravenous Every 8 hours 04/03/19 1443 04/04/19 0312   04/03/19 0700  cefTRIAXone (ROCEPHIN) 1 g in sodium chloride 0.9 % 100 mL IVPB  Status:  Discontinued     1 g 200 mL/hr over 30 Minutes Intravenous  Once 04/03/19 0651 04/03/19 0658    04/03/19 0700  piperacillin-tazobactam (ZOSYN) IVPB 3.375 g     3.375 g 100 mL/hr over 30 Minutes Intravenous  Once 04/03/19 7035 04/03/19 0810        Code Status: Full Code   Diet Order            Diet regular Room service appropriate? Yes; Fluid consistency: Thin  Diet effective now              Infusions:  . sodium chloride    . cefTRIAXone (ROCEPHIN)  IV 2 g (04/04/19 0518)  . lactated ringers with kcl 75 mL/hr at 04/04/19 0650    Scheduled Meds: . acyclovir  400 mg Oral BID  . folic acid  1 mg Oral Daily  . insulin aspart  0-5 Units Subcutaneous QHS  . insulin aspart  0-6 Units Subcutaneous TID WC  . montelukast  5 mg Oral QHS  . potassium chloride SA  20 mEq Oral Daily  . predniSONE  10 mg Oral Q breakfast  . rosuvastatin  5 mg Oral QODAY  . sodium chloride flush  3 mL Intravenous Q12H    PRN meds: sodium chloride, acetaminophen **OR** acetaminophen, ondansetron **OR** ondansetron (ZOFRAN) IV, polyethylene glycol, sodium chloride flush, traZODone   Objective: Vitals:   04/04/19 0242 04/04/19 0633  BP: 126/88 (!) 149/84  Pulse: 64 66  Resp: 18 18  Temp: 97.9 F (36.6 C) 97.7 F (36.5 C)  SpO2: 94% 94%    Intake/Output Summary (Last 24 hours) at 04/04/2019 1249 Last data filed at 04/04/2019 1213 Gross per 24 hour  Intake 2268.39 ml  Output 2100 ml  Net 168.39 ml   Filed Weights   04/03/19 0513 04/04/19 0319  Weight: 63.5 kg 67.9 kg   Weight change: 4.355 kg Body mass index is 30.22 kg/m.   Physical Exam: General exam: Appears calm and comfortable.  Skin: No rashes, lesions or ulcers. HEENT: Atraumatic, normocephalic, supple neck, no obvious bleeding Lungs: Clear to auscultation bilaterally CVS: Regular rate and rhythm, no murmur GI/Abd soft, nontender, nondistended, bowel sound present CNS: Alert, awake, oriented to place person, slow to respond to time Psychiatry: Mood appropriate Extremities: No pedal edema, no calf tenderness  Data  Review: I have personally reviewed the laboratory data and studies available.  Recent Labs  Lab 04/03/19 0604 04/04/19 0518  WBC 13.5* 20.6*  NEUTROABS 12.4*  --   HGB 10.3* 10.7*  HCT 32.8* 33.7*  MCV 101.2* 99.4  PLT 129* 135*   Recent Labs  Lab 04/03/19 0604 04/03/19 1356 04/04/19 0518  NA 139  --  139  K 3.1*  --  4.3  CL 108  --  113*  CO2 22  --  18*  GLUCOSE 141*  --  138*  BUN 31*  --  27*  CREATININE 1.86*  --  1.30*  CALCIUM 8.3*  --  8.3*  MG  --  1.5* 3.0*  PHOS  --   --  2.4*    Signed, Terrilee Croak, MD [facility] Pager: [phone] (Secure Chat preferred). 04/04/2019

## 2019-04-04 NOTE — Telephone Encounter (Signed)
shavellia Lottman (pt daughter) called to let us know pt is admitted at [facility] with a severe uti. Just wanted dr Moshe Cipro to know that dr pace urologist is following her there. Wanted to make sure that we follow up with her when she is out of the hospital.

## 2019-04-04 NOTE — TOC Initial Note (Signed)
Transition of Care [facility]) - Initial/Assessment Note    Patient Details  Name: Stacy Meyers MRN: [id number] Date of Birth: 11-10-1932  Transition of Care [facility] : [facility]) CM/SW Contact:    Dessa Phi, RN Phone Number: 04/04/2019, 1:08 PM  Clinical Narrative:PT cons-await recc.                  Expected Discharge Plan: Home/Self Care Barriers to Discharge: Continued Medical Work up   Patient Goals and CMS Choice Patient states their goals for this hospitalization and ongoing recovery are:: go home CMS Medicare.gov Compare Post Acute Care list provided to:: Patient Represenative (must comment)(grand dtr Marilynn Latino)    Expected Discharge Plan and Services Expected Discharge Plan: Home/Self Care   Discharge Planning Services: CM Consult   Living arrangements for the past 2 months: Single Family Home                                      Prior Living Arrangements/Services Living arrangements for the past 2 months: Single Family Home Lives with:: Adult Children Patient language and need for interpreter reviewed:: Yes Do you feel safe going back to the place where you live?: Yes      Need for Family Participation in Patient Care: No (Comment) Care giver support system in place?: Yes (comment) Current home services: DME(cane) Criminal Activity/Legal Involvement Pertinent to Current Situation/Hospitalization: No - Comment as needed  Activities of Daily Living Home Assistive Devices/Equipment: Cane (specify quad or straight) ADL Screening (condition at time of admission) Patient's cognitive ability adequate to safely complete daily activities?: Yes Is the patient deaf or have difficulty hearing?: No Does the patient have difficulty seeing, even when wearing glasses/contacts?: No Does the patient have difficulty concentrating, remembering, or making decisions?: No Patient able to express need for assistance with ADLs?: Yes Does the patient have difficulty dressing or  bathing?: No Independently performs ADLs?: Yes (appropriate for developmental age) Does the patient have difficulty walking or climbing stairs?: No Weakness of Legs: None Weakness of Arms/Hands: None  Permission Sought/Granted Permission sought to share information with : Case Manager Permission granted to share information with : Yes, Verbal Permission Granted  Share Information with NAME: Case Manager     Permission granted to share info w Relationship: Marilynn Latino grand dtr [phone]     Emotional Assessment Appearance:: Appears stated age Attitude/Demeanor/Rapport: Gracious Affect (typically observed): Accepting Orientation: : Oriented to Self, Oriented to Place, Oriented to  Time, Oriented to Situation Alcohol / Substance Use: Not Applicable Psych Involvement: No (comment)  Admission diagnosis:  Ureteral stone [N20.1] Acute cystitis without hematuria [N30.00] Sepsis ([place]) [A41.9] Hypotension, unspecified hypotension type [I95.9] Patient Active Problem List   Diagnosis Date Noted  . Sepsis ([place]) 04/03/2019  . Sepsis secondary to UTI /Nephrolithiasis with obstructive uropathy 04/03/2019  . Right nephrolithiasis-1.4cm Moderate right-sided urinary tract obstruction/hydronephrosis 04/03/2019  . Rt Sided Acute unilateral obstructive uropathy- 1.4 cm Prox Ureteric Stone 04/03/2019  . H/o Chronic heart failure with preserved ejection fraction (HFpEF) ([place]) 04/03/2019  . Umbilical hernia without obstruction and without gangrene 12/10/2018  . Pelvic pain in female 12/10/2018  . Cough with exposure to COVID-19 virus 11/20/2018  . Type 2 diabetes mellitus with complication, without long-term current use of insulin ([place]) 11/20/2018  . Fibroids 05/08/2016  . Spondylosis of lumbar region without myelopathy or radiculopathy 12/24/2015  . Solitary bone cyst of right shoulder 05/28/2015  .  Right knee pain 05/28/2015  . Osteoarthritis of both shoulders 05/28/2015  . Overweight (BMI  25.0-29.9) 04/28/2014  . Ganglion cyst of wrist 05/11/2012  . Systolic murmur [id number]  . Acute cystitis with hematuria 04/11/2011  . Diastolic dysfunction [id number]  . Seasonal allergies 08/28/2010  . Type 2 diabetes, diet controlled ([place]) 07/25/2009  . Herpes simplex virus (HSV) infection 09/16/2008  . Rheumatoid arthritis with rheumatoid factor of multiple sites without organ or systems involvement ([place]) 09/16/2008  . Hyperlipidemia LDL goal <100 05/23/2008  . Essential hypertension, benign 05/23/2008   PCP:  Fayrene Helper, MD Pharmacy:   [id number] [place], [address] [place] [address] Phone: [phone] Fax: [phone]     Social Determinants of Health (SDOH) Interventions    Readmission Risk Interventions No flowsheet data found.

## 2019-04-04 NOTE — Progress Notes (Signed)
PHARMACY - PHYSICIAN COMMUNICATION CRITICAL VALUE ALERT - BLOOD CULTURE IDENTIFICATION (BCID)  Stacy Meyers is an 86 y.o. female who presented to [facility] on 04/03/2019 with a chief complaint of sepsis secondary to complicated UTI.   Assessment:  UTI (include suspected source if known) 2 of 3 bottles e-coli.  Name of physician (or Provider) Contacted: Ardith Dark, NP  Current antibiotics: Zosyn  Changes to prescribed antibiotics recommended:  Recommendations accepted by provider  Will narrow to Rocephin 2 gm IV q24h  Results for orders placed or performed during the hospital encounter of 04/03/19  Blood Culture ID Panel (Reflexed) (Collected: 04/03/2019  7:08 AM)  Result Value Ref Range   Enterococcus species NOT DETECTED NOT DETECTED   Listeria monocytogenes NOT DETECTED NOT DETECTED   Staphylococcus species NOT DETECTED NOT DETECTED   Staphylococcus aureus (BCID) NOT DETECTED NOT DETECTED   Streptococcus species NOT DETECTED NOT DETECTED   Streptococcus agalactiae NOT DETECTED NOT DETECTED   Streptococcus pneumoniae NOT DETECTED NOT DETECTED   Streptococcus pyogenes NOT DETECTED NOT DETECTED   Acinetobacter baumannii NOT DETECTED NOT DETECTED   Enterobacteriaceae species DETECTED (A) NOT DETECTED   Enterobacter cloacae complex NOT DETECTED NOT DETECTED   Escherichia coli DETECTED (A) NOT DETECTED   Klebsiella oxytoca NOT DETECTED NOT DETECTED   Klebsiella pneumoniae NOT DETECTED NOT DETECTED   Proteus species NOT DETECTED NOT DETECTED   Serratia marcescens NOT DETECTED NOT DETECTED   Carbapenem resistance NOT DETECTED NOT DETECTED   Haemophilus influenzae NOT DETECTED NOT DETECTED   Neisseria meningitidis NOT DETECTED NOT DETECTED   Pseudomonas aeruginosa NOT DETECTED NOT DETECTED   Candida albicans NOT DETECTED NOT DETECTED   Candida glabrata NOT DETECTED NOT DETECTED   Candida krusei NOT DETECTED NOT DETECTED   Candida parapsilosis NOT DETECTED NOT DETECTED   Candida  tropicalis NOT DETECTED NOT DETECTED    Dorrene German 04/04/2019  2:59 AM

## 2019-04-05 DIAGNOSIS — E119 Type 2 diabetes mellitus without complications: Secondary | ICD-10-CM

## 2019-04-05 DIAGNOSIS — N139 Obstructive and reflux uropathy, unspecified: Secondary | ICD-10-CM

## 2019-04-05 DIAGNOSIS — I5032 Chronic diastolic (congestive) heart failure: Secondary | ICD-10-CM

## 2019-04-05 DIAGNOSIS — M0579 Rheumatoid arthritis with rheumatoid factor of multiple sites without organ or systems involvement: Secondary | ICD-10-CM

## 2019-04-05 DIAGNOSIS — B9620 Unspecified Escherichia coli [E. coli] as the cause of diseases classified elsewhere: Secondary | ICD-10-CM

## 2019-04-05 DIAGNOSIS — R7881 Bacteremia: Secondary | ICD-10-CM

## 2019-04-05 LAB — URINE CULTURE: Culture: >=100000 — AB

## 2019-04-05 LAB — GLUCOSE, CAPILLARY
Glucose-Capillary: 84 mg/dL (ref 70–99)
Glucose-Capillary: 92 mg/dL (ref 70–99)
Glucose-Capillary: 172 mg/dL — ABNORMAL HIGH (ref 70–99)
Glucose-Capillary: 157 mg/dL — ABNORMAL HIGH (ref 70–99)

## 2019-04-05 LAB — CBC WITH DIFFERENTIAL/PLATELET
Abs Immature Granulocytes: 0.08 10*3/uL — ABNORMAL HIGH (ref 0.00–0.07)
Basophils Absolute: 0 10*3/uL (ref 0.0–0.1)
Basophils Relative: 0 %
Eosinophils Absolute: 0 10*3/uL (ref 0.0–0.5)
Eosinophils Relative: 0 %
HCT: 33.6 % — ABNORMAL LOW (ref 36.0–46.0)
Hemoglobin: 10.7 g/dL — ABNORMAL LOW (ref 12.0–15.0)
Immature Granulocytes: 1 %
Lymphocytes Relative: 5 %
Lymphs Abs: 0.9 10*3/uL (ref 0.7–4.0)
MCH: 31.5 pg (ref 26.0–34.0)
MCHC: 31.8 g/dL (ref 30.0–36.0)
MCV: 98.8 fL (ref 80.0–100.0)
Monocytes Absolute: 0.5 10*3/uL (ref 0.1–1.0)
Monocytes Relative: 3 %
Neutro Abs: 15.9 10*3/uL — ABNORMAL HIGH (ref 1.7–7.7)
Neutrophils Relative %: 91 %
Platelets: 129 10*3/uL — ABNORMAL LOW (ref 150–400)
RBC: 3.4 MIL/uL — ABNORMAL LOW (ref 3.87–5.11)
RDW: 14.5 % (ref 11.5–15.5)
WBC: 17.5 10*3/uL — ABNORMAL HIGH (ref 4.0–10.5)
nRBC: 0.4 % — ABNORMAL HIGH (ref 0.0–0.2)

## 2019-04-05 LAB — BASIC METABOLIC PANEL
Anion gap: 8 (ref 5–15)
BUN: 31 mg/dL — ABNORMAL HIGH (ref 8–23)
CO2: 18 mmol/L — ABNORMAL LOW (ref 22–32)
Calcium: 8.7 mg/dL — ABNORMAL LOW (ref 8.9–10.3)
Chloride: 116 mmol/L — ABNORMAL HIGH (ref 98–111)
Creatinine, Ser: 1.04 mg/dL — ABNORMAL HIGH (ref 0.44–1.00)
GFR calc Af Amer: 57 mL/min — ABNORMAL LOW (ref >60)
GFR calc non Af Amer: 49 mL/min — ABNORMAL LOW (ref >60)
Glucose, Bld: 104 mg/dL — ABNORMAL HIGH (ref 70–99)
Potassium: 4.3 mmol/L (ref 3.5–5.1)
Sodium: 142 mmol/L (ref 135–145)

## 2019-04-05 LAB — PHOSPHORUS: Phosphorus: 2.2 mg/dL — ABNORMAL LOW (ref 2.5–4.6)

## 2019-04-05 LAB — MAGNESIUM: Magnesium: 2.7 mg/dL — ABNORMAL HIGH (ref 1.7–2.4)

## 2019-04-05 MED ORDER — GUAIFENESIN-DM 100-10 MG/5ML PO SYRP
5.0000 mL | ORAL_SOLUTION | ORAL | Status: DC | PRN
  Administered 2019-04-05 – 2019-04-06 (×4): 5 mL via ORAL
  Filled 2019-04-05 (×4): qty 10

## 2019-04-05 NOTE — Progress Notes (Signed)
Urology Inpatient Progress Report  Ureteral stone [N20.1] Acute cystitis without hematuria [N30.00] Sepsis ([place]) [A41.9] Hypotension, unspecified hypotension type [I95.9]  Procedure(s): CYSTOSCOPY, RIGHT RETROGRADE PYELOGRAM  WITH STENT PLACEMENT  2 Days Post-Op   Intv/Subj: No acute events overnight. Patient is without complaint.  Principal Problem:   Sepsis secondary to UTI /Nephrolithiasis with obstructive uropathy Active Problems:   Rheumatoid arthritis with rheumatoid factor of multiple sites without organ or systems involvement (HCC)   Type 2 diabetes, diet controlled (HCC)   Right nephrolithiasis-1.4cm Moderate right-sided urinary tract obstruction/hydronephrosis   Rt Sided Acute unilateral obstructive uropathy- 1.4 cm Prox Ureteric Stone   H/o Chronic heart failure with preserved ejection fraction (HFpEF) ([place])  Current Facility-Administered Medications  Medication Dose Route Frequency Provider Last Rate Last Admin  . 0.9 %  sodium chloride infusion  250 mL Intravenous PRN Roxan Hockey, MD 10 mL/hr at 04/05/19 0229 250 mL at 04/05/19 0229  . acetaminophen (TYLENOL) tablet 650 mg  650 mg Oral Q6H PRN Emokpae, Courage, MD       Or  . acetaminophen (TYLENOL) suppository 650 mg  650 mg Rectal Q6H PRN Emokpae, Courage, MD      . acyclovir (ZOVIRAX) tablet 400 mg  400 mg Oral BID Denton Brick, Courage, MD   400 mg at 04/04/19 2137  . cefTRIAXone (ROCEPHIN) 2 g in sodium chloride 0.9 % 100 mL IVPB  2 g Intravenous Q0600 Dorrene German, RPH 200 mL/hr at 04/05/19 [id number] 2 g at 04/05/19 [id number]  . folic acid (FOLVITE) tablet 1 mg  1 mg Oral Daily Emokpae, Courage, MD   1 mg at 04/04/19 0906  . insulin aspart (novoLOG) injection 0-5 Units  0-5 Units Subcutaneous QHS Emokpae, Courage, MD      . insulin aspart (novoLOG) injection 0-6 Units  0-6 Units Subcutaneous TID WC Emokpae, Courage, MD      . ipratropium-albuterol (DUONEB) 0.5-2.5 (3) MG/3ML nebulizer solution 3 mL  3 mL  Nebulization Q4H PRN Lang Snow, FNP   3 mL at 04/04/19 2217  . loperamide (IMODIUM) capsule 2 mg  2 mg Oral Q6H PRN Dahal, Binaya, MD      . montelukast (SINGULAIR) tablet 5 mg  5 mg Oral QHS Emokpae, Courage, MD   5 mg at 04/04/19 2137  . ondansetron (ZOFRAN) tablet 4 mg  4 mg Oral Q6H PRN Emokpae, Courage, MD       Or  . ondansetron (ZOFRAN) injection 4 mg  4 mg Intravenous Q6H PRN Emokpae, Courage, MD      . polyethylene glycol (MIRALAX / GLYCOLAX) packet 17 g  17 g Oral Daily PRN Emokpae, Courage, MD      . potassium chloride SA (KLOR-CON) CR tablet 20 mEq  20 mEq Oral Daily Emokpae, Courage, MD   20 mEq at 04/04/19 0906  . predniSONE (DELTASONE) tablet 10 mg  10 mg Oral Q breakfast Denton Brick, Courage, MD   10 mg at 04/04/19 0906  . rosuvastatin (CRESTOR) tablet 5 mg  5 mg Oral Lovena Le, Courage, MD   5 mg at 04/04/19 0906  . sodium chloride flush (NS) 0.9 % injection 3 mL  3 mL Intravenous Q12H Emokpae, Courage, MD      . sodium chloride flush (NS) 0.9 % injection 3 mL  3 mL Intravenous PRN Emokpae, Courage, MD      . traZODone (DESYREL) tablet 50 mg  50 mg Oral QHS PRN Denton Brick, Courage, MD         Objective: Vital: Vitals:  04/04/19 2058 04/04/19 2218 04/05/19 0021 04/05/19 0456  BP: (!) 194/88  139/75 (!) 155/73  Pulse: 64  62 64  Resp: 18   18  Temp: 97.9 F (36.6 C)   97.9 F (36.6 C)  TempSrc: Oral   Oral  SpO2: 98% 94%  95%  Weight:    67.7 kg  Height:       I/Os: I/O last 3 completed shifts: In: 200 [I.V.:200] Out: 1850 [Urine:1750; Stool:100]  Physical Exam:  General: Patient is in no apparent distress Lungs: Normal respiratory effort, chest expands symmetrically. GI: The abdomen is soft and nontender without mass. Ext: lower extremities symmetric  Lab Results: Recent Labs    04/03/19 0604 04/04/19 0518 04/05/19 0512  WBC 13.5* 20.6* 17.5*  HGB 10.3* 10.7* 10.7*  HCT 32.8* 33.7* 33.6*   Recent Labs    04/03/19 0604 04/04/19 0518  04/05/19 0512  NA 139 139 142  K 3.1* 4.3 4.3  CL 108 113* 116*  CO2 22 18* 18*  GLUCOSE 141* 138* 104*  BUN 31* 27* 31*  CREATININE 1.86* 1.30* 1.04*  CALCIUM 8.3* 8.3* 8.7*   Recent Labs    04/03/19 0708  INR 1.5*   No results for input(s): LABURIN in the last 72 hours. Results for orders placed or performed during the hospital encounter of 04/03/19  Urine culture     Status: Abnormal   Collection Time: 04/03/19  5:23 AM   Specimen: Urine, Clean Catch  Result Value Ref Range Status   Specimen Description   Final    URINE, CLEAN CATCH Performed at [facility], [address] [place], [place] [address]    Special Requests   Final    NONE Performed at [facility], [address] [place], [place] [address]    Culture >=100,000 COLONIES/mL ESCHERICHIA COLI (A)  Final   Report Status 04/05/2019 FINAL  Final   Organism ID, Bacteria ESCHERICHIA COLI (A)  Final      Susceptibility   Escherichia coli - MIC*    AMPICILLIN 8 SENSITIVE Sensitive     CEFAZOLIN <=4 SENSITIVE Sensitive     CEFTRIAXONE <=0.25 SENSITIVE Sensitive     CIPROFLOXACIN <=0.25 SENSITIVE Sensitive     GENTAMICIN <=1 SENSITIVE Sensitive     IMIPENEM <=0.25 SENSITIVE Sensitive     NITROFURANTOIN <=16 SENSITIVE Sensitive     TRIMETH/SULFA <=20 SENSITIVE Sensitive     AMPICILLIN/SULBACTAM <=2 SENSITIVE Sensitive     PIP/TAZO <=4 SENSITIVE Sensitive     * >=100,000 COLONIES/mL ESCHERICHIA COLI  Blood Culture (routine x 2)     Status: Abnormal (Preliminary result)   Collection Time: 04/03/19  7:08 AM   Specimen: BLOOD RIGHT ARM  Result Value Ref Range Status   Specimen Description   Final    BLOOD RIGHT ARM Performed at [facility], [address], [place], [place] [address]    Special Requests   Final    BOTTLES DRAWN AEROBIC AND ANAEROBIC Blood Culture adequate volume Performed at [facility], [address], [place], [place] [address]    Culture  Setup Time   Final    IN BOTH AEROBIC AND ANAEROBIC  BOTTLES GRAM NEGATIVE RODS Gram Stain Report Called to,Read Back By and Verified With: H PHILLIPS,RN @2309  04/03/19 MKELLY CRITICAL RESULT CALLED TO, READ BACK BY AND VERIFIED WITH: B. GREEN,PHARMD [id number] 04/04/2019 T. TYSOR    Culture ESCHERICHIA COLI (A)  Final   Report Status PENDING  Incomplete  Blood Culture (routine x 2)  Status: None (Preliminary result)   Collection Time: 04/03/19  7:08 AM   Specimen: BLOOD RIGHT HAND  Result Value Ref Range Status   Specimen Description BLOOD RIGHT HAND  Final   Special Requests   Final    BOTTLES DRAWN AEROBIC ONLY Blood Culture results may not be optimal due to an inadequate volume of blood received in culture bottles   Culture   Final    NO GROWTH 1 DAY Performed at [facility], [address], [place],  [address]    Report Status PENDING  Incomplete  Blood Culture ID Panel (Reflexed)     Status: Abnormal   Collection Time: 04/03/19  7:08 AM  Result Value Ref Range Status   Enterococcus species NOT DETECTED NOT DETECTED Final   Listeria monocytogenes NOT DETECTED NOT DETECTED Final   Staphylococcus species NOT DETECTED NOT DETECTED Final   Staphylococcus aureus (BCID) NOT DETECTED NOT DETECTED Final   Streptococcus species NOT DETECTED NOT DETECTED Final   Streptococcus agalactiae NOT DETECTED NOT DETECTED Final   Streptococcus pneumoniae NOT DETECTED NOT DETECTED Final   Streptococcus pyogenes NOT DETECTED NOT DETECTED Final   Acinetobacter baumannii NOT DETECTED NOT DETECTED Final   Enterobacteriaceae species DETECTED (A) NOT DETECTED Final    Comment: Enterobacteriaceae represent a large family of gram-negative bacteria, not a single organism. CRITICAL RESULT CALLED TO, READ BACK BY AND VERIFIED WITH: B. GREEN,PHARMD 0253 04/04/2019 T. TYSOR    Enterobacter cloacae complex NOT DETECTED NOT DETECTED Final   Escherichia coli DETECTED (A) NOT DETECTED Final    Comment: CRITICAL RESULT CALLED TO, READ BACK BY AND VERIFIED  WITH: B. GREEN,PHARMD 0253 04/04/2019 T. TYSOR    Klebsiella oxytoca NOT DETECTED NOT DETECTED Final   Klebsiella pneumoniae NOT DETECTED NOT DETECTED Final   Proteus species NOT DETECTED NOT DETECTED Final   Serratia marcescens NOT DETECTED NOT DETECTED Final   Carbapenem resistance NOT DETECTED NOT DETECTED Final   Haemophilus influenzae NOT DETECTED NOT DETECTED Final   Neisseria meningitidis NOT DETECTED NOT DETECTED Final   Pseudomonas aeruginosa NOT DETECTED NOT DETECTED Final   Candida albicans NOT DETECTED NOT DETECTED Final   Candida glabrata NOT DETECTED NOT DETECTED Final   Candida krusei NOT DETECTED NOT DETECTED Final   Candida parapsilosis NOT DETECTED NOT DETECTED Final   Candida tropicalis NOT DETECTED NOT DETECTED Final  Respiratory Panel by RT PCR (Flu A&B, Covid) - Nasopharyngeal Swab     Status: None   Collection Time: 04/03/19  8:26 AM   Specimen: Nasopharyngeal Swab  Result Value Ref Range Status   SARS Coronavirus 2 by RT PCR NEGATIVE NEGATIVE Final    Comment: (NOTE) SARS-CoV-2 target nucleic acids are NOT DETECTED. The SARS-CoV-2 RNA is generally detectable in upper respiratoy specimens during the acute phase of infection. The lowest concentration of SARS-CoV-2 viral copies this assay can detect is 131 copies/mL. A negative result does not preclude SARS-Cov-2 infection and should not be used as the sole basis for treatment or other patient management decisions. A negative result may occur with  improper specimen collection/handling, submission of specimen other than nasopharyngeal swab, presence of viral mutation(s) within the areas targeted by this assay, and inadequate number of viral copies (<131 copies/mL). A negative result must be combined with clinical observations, patient history, and epidemiological information. The expected result is Negative. Fact Sheet for Patients:  PinkCheek.be Fact Sheet for Healthcare  Providers:  GravelBags.it This test is not yet ap proved or cleared by the [place]  States FDA and  has been authorized for detection and/or diagnosis of SARS-CoV-2 by FDA under an Emergency Use Authorization (EUA). This EUA will remain  in effect (meaning this test can be used) for the duration of the COVID-19 declaration under Section 564(b)(1) of the Act, 21 U.S.C. section 360bbb-3(b)(1), unless the authorization is terminated or revoked sooner.    Influenza A by PCR NEGATIVE NEGATIVE Final   Influenza B by PCR NEGATIVE NEGATIVE Final    Comment: (NOTE) The Xpert Xpress SARS-CoV-2/FLU/RSV assay is intended as an aid in  the diagnosis of influenza from Nasopharyngeal swab specimens and  should not be used as a sole basis for treatment. Nasal washings and  aspirates are unacceptable for Xpert Xpress SARS-CoV-2/FLU/RSV  testing. Fact Sheet for Patients: PinkCheek.be Fact Sheet for Healthcare Providers: GravelBags.it This test is not yet approved or cleared by the [place] FDA and  has been authorized for detection and/or diagnosis of SARS-CoV-2 by  FDA under an Emergency Use Authorization (EUA). This EUA will remain  in effect (meaning this test can be used) for the duration of the  Covid-19 declaration under Section 564(b)(1) of the Act, 21  U.S.C. section 360bbb-3(b)(1), unless the authorization is  terminated or revoked. Performed at [facility], [address], [place], [place] [address]   Urine Culture     Status: Abnormal   Collection Time: 04/03/19 12:32 PM   Specimen: Urine, Clean Catch  Result Value Ref Range Status   Specimen Description   Final    URINE, CLEAN CATCH Performed at [facility], [place] [address] [place], [place] [address]    Special Requests NONE  Final   Culture (A)  Final    <10,000 COLONIES/mL INSIGNIFICANT GROWTH Performed at [facility], [place] [address], [place], [place] [address]    Report Status 04/04/2019 FINAL  Final    Studies/Results: DG C-Arm 1-60 Min-No Report  Result Date: 04/03/2019 Fluoroscopy was utilized by the requesting physician.  No radiographic interpretation.   CT Renal Stone Study  Result Date: 04/03/2019 CLINICAL DATA:  Abdominal pain and nausea for 1 day. EXAM: CT ABDOMEN AND PELVIS WITHOUT CONTRAST TECHNIQUE: Multidetector CT imaging of the abdomen and pelvis was performed following the standard protocol without IV contrast. COMPARISON:  05/25/2016 FINDINGS: Lower chest: Dependent bibasilar atelectasis. Left lower lobe calcified granuloma. Tiny bilateral pleural effusions. Mild cardiomegaly. Multivessel coronary artery atherosclerosis. Hepatobiliary: High left hepatic lobe cyst. Cholecystectomy, without biliary ductal dilatation. Mild motion degradation in the lower chest and abdomen. Pancreas: Grossly normal pancreas for age. Spleen: Normal in size, without focal abnormality. Adrenals/Urinary Tract: Normal adrenal glands. Small bilateral renal collecting system calculi. Interpolar left renal 2.4 cm cyst. Moderate right-sided hydroureteronephrosis to the level of a proximal right ureteric 1.0 x 1.4 cm stone, including on coronal image 46 transverse image 46. Perinephric edema and fluid, suggesting forniceal rupture. No bladder calculi. Stomach/Bowel: Tiny hiatal hernia. Extensive colonic diverticulosis. Lipoma within the ascending colon at 2.8 cm on 41/2. Normal terminal ileum and appendix. Normal small bowel. Vascular/Lymphatic: Aortic atherosclerosis. No abdominopelvic adenopathy. Reproductive: Fibroid uterus.  No adnexal mass. Other: No significant free fluid. Fat containing ventral abdominal wall hernia. Musculoskeletal: Advanced lumbosacral spondylosis. Grade 1-2 L4-5 anterolisthesis. IMPRESSION: 1. Moderate right-sided urinary tract obstruction secondary to a proximal right ureteric 1.4 cm calculus.  2. Bilateral nephrolithiasis. 3. Tiny bilateral pleural effusions. 4. Fibroid uterus. 5. Coronary artery atherosclerosis. Aortic Atherosclerosis (ICD10-I70.0). 6. Ascending colonic lipoma, without obstruction. Electronically Signed  By: Abigail Miyamoto M.D.   On: 04/03/2019 09:15    Assessment: 86 yo woman with 1.5 cm right ureteral calculus and infection associated with sepsis now s/p CYSTOSCOPY, RIGHT RETROGRADE PYELOGRAM  WITH STENT PLACEMENT, 2 Days Post-Op  doing well.  Plan: -urine culture shows pan sensitive E coli -tailor antibiotics according to culture results -Urology follow up 04/13/19 at 2:15pm   Jacalyn Lefevre, MD Urology 04/05/2019, 8:07 AM

## 2019-04-05 NOTE — Progress Notes (Signed)
CRITICAL VALUE ALERT  Critical Value:  + blood cultures: gram negative rods in the aerobic bottle  Date & Time Notied:  04/05/2019 1740  Provider Notified:   Orders Received/Actions taken: pt on antibiotics

## 2019-04-05 NOTE — Progress Notes (Signed)
PROGRESS NOTE    Stacy Meyers  XIH:[id number] DOB: [birth year] DOA: 04/03/2019 PCP: Fayrene Helper, MD   Brief Narrative: Stacy Meyers is a 86 y.o. femalewith past medical history relevant for HFpEF,HLD, HTN, rheumatoid arthritis on methotrexate and prednisone. Patient presented secondary to nausea, vomiting and diarrhea and found to have a UTI, obstructive uropathy and met sepsis criteria on admission.    Assessment & Plan:   Principal Problem:   Sepsis secondary to UTI /Nephrolithiasis with obstructive uropathy Active Problems:   Rheumatoid arthritis with rheumatoid factor of multiple sites without organ or systems involvement (HCC)   Type 2 diabetes, diet controlled (HCC)   Right nephrolithiasis-1.4cm Moderate right-sided urinary tract obstruction/hydronephrosis   Rt Sided Acute unilateral obstructive uropathy- 1.4 cm Prox Ureteric Stone   H/o Chronic heart failure with preserved ejection fraction (HFpEF) ([place])   Sepsis Present on admission and secondary to below problems. Treated empirically with Zosyn IV initially and transitioned to Ceftriaxone IV. Sepsis physiology improved and resolved with stent placement and antibiotic treatment.  E. Coli Bacteremia E. Coli UTI; complicated Urine culture significant for pan-sensitive E. Coli. Blood culture significant for E. Coli with pending sensitivities. -Continue Ceftriaxone IV -Transition to amoxicillin 500 mg TID pending blood culture sensitivity results  Obstructive uropathy 1.5 cm right proximal obstructing stone with associated infection. Patient underwent cystoscopy with stent placement on 3/29 by urology. Recommendations for outpatient urology follow-up in 1 week.  Rheumatoid arthritis Patient is on methotrexate, prednisone as an outpatient. Methotrexate held secondary to acute infection. -Continue prednisone  Essential hypertension On amlodipine as an outpatient. Amlodipine held on admission secondary to sepsis.  Hyperlipidemia -Continue Crestor  Diabetes mellitus, type 2 Patient is on diet control as an outpatient. Hemoglobin A1C of 6.3% from February 2021. -Continue SSI  Chronic heart failure with preserved EF History. Last Transthoracic Echocardiogram from 2012. Euvolemic.  Osteoporosis On alendronate as an outpatient. Held on admission.  Hypermagnesemia Likely secondary to AKI. Trending down.  AKI Secondary to obstructive uropathy and associated right-sided hydroureteronephrosis  Metabolic acidosis Secondary to AKI. Stable.   DVT prophylaxis: SCDs Code Status:   Code Status: Full Code Family Communication: None at bedside Disposition Plan: Discharge pending results from blood cultures, transition to oral antibiotics and PT recommendations   Consultants:   Urology  Procedures:   CYSTOSCOPY, RIGHT RETROGRADE PYELOGRAM  WITH STENT PLACEMENT (04/03/19)    Antimicrobials:  Zosyn IV  Ceftriaxone IV    Subjective: Has a dry cough. No other concerns  Objective: Vitals:   04/04/19 2218 04/05/19 0021 04/05/19 0456 04/05/19 1259  BP:  139/75 (!) 155/73 (!) 158/80  Pulse:  62 64 66  Resp:   18 17  Temp:   97.9 F (36.6 C) 98.9 F (37.2 C)  TempSrc:   Oral Oral  SpO2: 94%  95% 100%  Weight:   67.7 kg   Height:        Intake/Output Summary (Last 24 hours) at 04/05/2019 1304 Last data filed at 04/05/2019 0857 Gross per 24 hour  Intake 680 ml  Output 1000 ml  Net -320 ml   Filed Weights   04/03/19 0513 04/04/19 0319 04/05/19 0456  Weight: 63.5 kg 67.9 kg 67.7 kg    Examination:  General exam: Appears calm and comfortable Respiratory system: Clear to auscultation. Respiratory effort normal. Cardiovascular system: S1 & S2 heard, RRR. No murmurs, rubs, gallops or clicks. Gastrointestinal system: Abdomen is nondistended, soft and nontender. No organomegaly or masses felt. Normal bowel sounds  heard. Central nervous system: Alert and oriented. No focal  neurological deficits. Extremities: No edema. No calf tenderness Skin: No cyanosis. No rashes Psychiatry: Judgement and insight appear normal. Mood & affect appropriate.     Data Reviewed: I have personally reviewed following labs and imaging studies  CBC: Recent Labs  Lab 04/03/19 0604 04/04/19 0518 04/05/19 0512  WBC 13.5* 20.6* 17.5*  NEUTROABS 12.4*  --  15.9*  HGB 10.3* 10.7* 10.7*  HCT 32.8* 33.7* 33.6*  MCV 101.2* 99.4 98.8  PLT 129* 135* 977*   Basic Metabolic Panel: Recent Labs  Lab 04/03/19 0604 04/03/19 1356 04/04/19 0518 04/05/19 0512  NA 139  --  139 142  K 3.1*  --  4.3 4.3  CL 108  --  113* 116*  CO2 22  --  18* 18*  GLUCOSE 141*  --  138* 104*  BUN 31*  --  27* 31*  CREATININE 1.86*  --  1.30* 1.04*  CALCIUM 8.3*  --  8.3* 8.7*  MG  --  1.5* 3.0* 2.7*  PHOS  --   --  2.4* 2.2*   GFR: Estimated Creatinine Clearance: 33.1 mL/min (A) (by C-G formula based on SCr of 1.04 mg/dL (H)). Liver Function Tests: Recent Labs  Lab 04/03/19 0604  AST 21  ALT 13  ALKPHOS 83  BILITOT 0.8  PROT 5.1*  ALBUMIN 2.6*   No results for input(s): LIPASE, AMYLASE in the last 168 hours. No results for input(s): AMMONIA in the last 168 hours. Coagulation Profile: Recent Labs  Lab 04/03/19 0708  INR 1.5*   Cardiac Enzymes: No results for input(s): CKTOTAL, CKMB, CKMBINDEX, TROPONINI in the last 168 hours. BNP (last 3 results) No results for input(s): PROBNP in the last 8760 hours. HbA1C: No results for input(s): HGBA1C in the last 72 hours. CBG: Recent Labs  Lab 04/04/19 1206 04/04/19 1647 04/04/19 2053 04/05/19 0754 04/05/19 1133  GLUCAP 131* 128* 122* 84 92   Lipid Profile: No results for input(s): CHOL, HDL, LDLCALC, TRIG, CHOLHDL, LDLDIRECT in the last 72 hours. Thyroid Function Tests: No results for input(s): TSH, T4TOTAL, FREET4, T3FREE, THYROIDAB in the last 72 hours. Anemia Panel: No results for input(s): VITAMINB12, FOLATE, FERRITIN, TIBC,  IRON, RETICCTPCT in the last 72 hours. Sepsis Labs: Recent Labs  Lab 04/03/19 0604 04/03/19 0811  LATICACIDVEN 4.2* 0.7    Recent Results (from the past 240 hour(s))  Urine culture     Status: Abnormal   Collection Time: 04/03/19  5:23 AM   Specimen: Urine, Clean Catch  Result Value Ref Range Status   Specimen Description   Final    URINE, CLEAN CATCH Performed at [facility], [address], [place], [place] [address]    Special Requests   Final    NONE Performed at [facility], [address], [place], [place] [address]    Culture >=100,000 COLONIES/mL ESCHERICHIA COLI (A)  Final   Report Status 04/05/2019 FINAL  Final   Organism ID, Bacteria ESCHERICHIA COLI (A)  Final      Susceptibility   Escherichia coli - MIC*    AMPICILLIN 8 SENSITIVE Sensitive     CEFAZOLIN <=4 SENSITIVE Sensitive     CEFTRIAXONE <=0.25 SENSITIVE Sensitive     CIPROFLOXACIN <=0.25 SENSITIVE Sensitive     GENTAMICIN <=1 SENSITIVE Sensitive     IMIPENEM <=0.25 SENSITIVE Sensitive     NITROFURANTOIN <=16 SENSITIVE Sensitive     TRIMETH/SULFA <=20 SENSITIVE Sensitive     AMPICILLIN/SULBACTAM <=2 SENSITIVE  Sensitive     PIP/TAZO <=4 SENSITIVE Sensitive     * >=100,000 COLONIES/mL ESCHERICHIA COLI  Blood Culture (routine x 2)     Status: Abnormal (Preliminary result)   Collection Time: 04/03/19  7:08 AM   Specimen: BLOOD RIGHT ARM  Result Value Ref Range Status   Specimen Description   Final    BLOOD RIGHT ARM Performed at [facility], [address] [place], [place] [address]    Special Requests   Final    BOTTLES DRAWN AEROBIC AND ANAEROBIC Blood Culture adequate volume Performed at [facility], [address] [place], [place] [address]    Culture  Setup Time   Final    IN BOTH AEROBIC AND ANAEROBIC BOTTLES GRAM NEGATIVE RODS Gram Stain Report Called to,Read Back By and Verified With: H PHILLIPS,RN '@[id number]'  04/03/19 MKELLY CRITICAL RESULT CALLED TO, READ BACK BY AND VERIFIED WITH: BShirlee Latch [id number] 04/04/2019 T. TYSOR    Culture ESCHERICHIA COLI (A)  Final   Report Status PENDING  Incomplete  Blood Culture (routine x 2)     Status: None (Preliminary result)   Collection Time: 04/03/19  7:08 AM   Specimen: BLOOD RIGHT HAND  Result Value Ref Range Status   Specimen Description BLOOD RIGHT HAND  Final   Special Requests   Final    BOTTLES DRAWN AEROBIC ONLY Blood Culture results may not be optimal due to an inadequate volume of blood received in culture bottles   Culture   Final    NO GROWTH 1 DAY Performed at [facility], [address], [place], [place] [address]    Report Status PENDING  Incomplete  Blood Culture ID Panel (Reflexed)     Status: Abnormal   Collection Time: 04/03/19  7:08 AM  Result Value Ref Range Status   Enterococcus species NOT DETECTED NOT DETECTED Final   Listeria monocytogenes NOT DETECTED NOT DETECTED Final   Staphylococcus species NOT DETECTED NOT DETECTED Final   Staphylococcus aureus (BCID) NOT DETECTED NOT DETECTED Final   Streptococcus species NOT DETECTED NOT DETECTED Final   Streptococcus agalactiae NOT DETECTED NOT DETECTED Final   Streptococcus pneumoniae NOT DETECTED NOT DETECTED Final   Streptococcus pyogenes NOT DETECTED NOT DETECTED Final   Acinetobacter baumannii NOT DETECTED NOT DETECTED Final   Enterobacteriaceae species DETECTED (A) NOT DETECTED Final    Comment: Enterobacteriaceae represent a large family of gram-negative bacteria, not a single organism. CRITICAL RESULT CALLED TO, READ BACK BY AND VERIFIED WITH: B. GREEN,PHARMD 0253 04/04/2019 T. TYSOR    Enterobacter cloacae complex NOT DETECTED NOT DETECTED Final   Escherichia coli DETECTED (A) NOT DETECTED Final    Comment: CRITICAL RESULT CALLED TO, READ BACK BY AND VERIFIED WITH: B. GREEN,PHARMD 0253 04/04/2019 T. TYSOR    Klebsiella oxytoca NOT DETECTED NOT DETECTED Final   Klebsiella pneumoniae NOT DETECTED NOT DETECTED Final   Proteus species NOT DETECTED  NOT DETECTED Final   Serratia marcescens NOT DETECTED NOT DETECTED Final   Carbapenem resistance NOT DETECTED NOT DETECTED Final   Haemophilus influenzae NOT DETECTED NOT DETECTED Final   Neisseria meningitidis NOT DETECTED NOT DETECTED Final   Pseudomonas aeruginosa NOT DETECTED NOT DETECTED Final   Candida albicans NOT DETECTED NOT DETECTED Final   Candida glabrata NOT DETECTED NOT DETECTED Final   Candida krusei NOT DETECTED NOT DETECTED Final   Candida parapsilosis NOT DETECTED NOT DETECTED Final   Candida tropicalis NOT DETECTED NOT DETECTED Final  Respiratory Panel by RT PCR (Flu A&B,  Covid) - Nasopharyngeal Swab     Status: None   Collection Time: 04/03/19  8:26 AM   Specimen: Nasopharyngeal Swab  Result Value Ref Range Status   SARS Coronavirus 2 by RT PCR NEGATIVE NEGATIVE Final    Comment: (NOTE) SARS-CoV-2 target nucleic acids are NOT DETECTED. The SARS-CoV-2 RNA is generally detectable in upper respiratoy specimens during the acute phase of infection. The lowest concentration of SARS-CoV-2 viral copies this assay can detect is 131 copies/mL. A negative result does not preclude SARS-Cov-2 infection and should not be used as the sole basis for treatment or other patient management decisions. A negative result may occur with  improper specimen collection/handling, submission of specimen other than nasopharyngeal swab, presence of viral mutation(s) within the areas targeted by this assay, and inadequate number of viral copies (<131 copies/mL). A negative result must be combined with clinical observations, patient history, and epidemiological information. The expected result is Negative. Fact Sheet for Patients:  PinkCheek.be Fact Sheet for Healthcare Providers:  GravelBags.it This test is not yet ap proved or cleared by the [place] FDA and  has been authorized for detection and/or diagnosis of SARS-CoV-2 by FDA  under an Emergency Use Authorization (EUA). This EUA will remain  in effect (meaning this test can be used) for the duration of the COVID-19 declaration under Section 564(b)(1) of the Act, 21 U.S.C. section 360bbb-3(b)(1), unless the authorization is terminated or revoked sooner.    Influenza A by PCR NEGATIVE NEGATIVE Final   Influenza B by PCR NEGATIVE NEGATIVE Final    Comment: (NOTE) The Xpert Xpress SARS-CoV-2/FLU/RSV assay is intended as an aid in  the diagnosis of influenza from Nasopharyngeal swab specimens and  should not be used as a sole basis for treatment. Nasal washings and  aspirates are unacceptable for Xpert Xpress SARS-CoV-2/FLU/RSV  testing. Fact Sheet for Patients: PinkCheek.be Fact Sheet for Healthcare Providers: GravelBags.it This test is not yet approved or cleared by the [place] FDA and  has been authorized for detection and/or diagnosis of SARS-CoV-2 by  FDA under an Emergency Use Authorization (EUA). This EUA will remain  in effect (meaning this test can be used) for the duration of the  Covid-19 declaration under Section 564(b)(1) of the Act, 21  U.S.C. section 360bbb-3(b)(1), unless the authorization is  terminated or revoked. Performed at [facility], [address], [place], [place] [address]   Urine Culture     Status: Abnormal   Collection Time: 04/03/19 12:32 PM   Specimen: Urine, Clean Catch  Result Value Ref Range Status   Specimen Description   Final    URINE, CLEAN CATCH Performed at [facility], [place] [address], [place], [place] [address]    Special Requests NONE  Final   Culture (A)  Final    <10,000 COLONIES/mL INSIGNIFICANT GROWTH Performed at [facility], [place] [address], [place], [place] [address]    Report Status 04/04/2019 FINAL  Final         Radiology Studies: DG C-Arm 1-60 Min-No Report  Result Date: 04/03/2019 Fluoroscopy was  utilized by the requesting physician.  No radiographic interpretation.        Scheduled Meds: . acyclovir  400 mg Oral BID  . folic acid  1 mg Oral Daily  . insulin aspart  0-5 Units Subcutaneous QHS  . insulin aspart  0-6 Units Subcutaneous TID WC  . montelukast  5 mg Oral QHS  . potassium chloride SA  20 mEq Oral  Daily  . predniSONE  10 mg Oral Q breakfast  . rosuvastatin  5 mg Oral QODAY  . sodium chloride flush  3 mL Intravenous Q12H   Continuous Infusions: . sodium chloride 250 mL (04/05/19 0229)  . cefTRIAXone (ROCEPHIN)  IV 2 g (04/05/19 0045)     LOS: 2 days     Cordelia Poche, MD [facility] 04/05/2019, 1:04 PM  If 7PM-7AM, please contact night-coverage [email or web address]

## 2019-04-05 NOTE — Evaluation (Signed)
Physical Therapy Evaluation Patient Details Name: Stacy Meyers MRN: [id number] DOB: [birth year] Today's Date: 04/05/2019   History of Present Illness  [place] YOSELIN PEED is a 86 y.o. female with past medical history relevant for HFpEF, HLD, HTN, rheumatoid arthritis on methotrexate and prednisone. Patient presented secondary to nausea, vomiting and diarrhea and found to have a UTI and obstructive uropathy. Pt admitted for sepsis secondary to UTI. Pateint is now s/p cystoscopy with stent placement on 04/03/19.    Clinical Impression  Stacy Meyers is 86 y.o. female admitted with above HPI and diagnosis. Patient is currently limited by functional impairments below (see PT problem list). Patient lives with her 2 adult grandchildren and is independent at baseline for household mobility and self care/ADl's. Patient will benefit from continued skilled PT interventions to address impairments and progress independence with mobility, recommending HHPT. Acute PT will follow and progress as able.     Follow Up Recommendations Home health PT;Supervision - Intermittent    Equipment Recommendations  None recommended by PT(pt has RW at home)    Recommendations for Other Services       Precautions / Restrictions Precautions Precautions: Fall Restrictions Weight Bearing Restrictions: No      Mobility  Bed Mobility      General bed mobility comments: pt OOB in recliner at start, ended in recliner.  Transfers Overall transfer level: Needs assistance Equipment used: None Transfers: Sit to/from Stand Sit to Stand: Min assist         General transfer comment: cues for technique to press up from arm rest on reclienr and on BSC. assist required to initaite and complete power up.   Ambulation/Gait Ambulation/Gait assistance: Min assist   Assistive device: IV Pole Gait Pattern/deviations: Step-to pattern;Decreased stride length;Trunk flexed;Drifts right/left Gait velocity: fair   General Gait  Details: pt unsteady with gait requiring min assist to prevent LOB durign turns. Min assist needed to manage IV pole safely and maintain safe proximity. pt reliant on bil UE support on IV pole, recommend [place] of RW at home and in hospital. Pt with small amount of urine incontinence in room after gait, pt stopped and cues provided to perform kegal and hold/perform intermittently whlie taking several steps to [facility]. pt able to perform and prevent further incontinence.   Stairs       Wheelchair Mobility    Modified Rankin (Stroke Patients Only)       Balance Overall balance assessment: Needs assistance Sitting-balance support: Feet supported Sitting balance-Leahy Scale: Good     Standing balance support: During functional activity;Bilateral upper extremity supported Standing balance-Leahy Scale: Poor           Pertinent Vitals/Pain      Home Living Family/patient expects to be discharged to:: Private residence Living Arrangements: Other relatives;Children(grandson and granddaugther live with her) Available Help at Discharge: Family;Available 24 hours/day(gradndaughter works from home) Type of Home: House Home Access: Stairs to enter [employer]: Can reach both;Right(front can reach both, back has Rt rail) Technical brewer of Steps: 4-5 Home Layout: One level Home Equipment: [place] - 2 wheels;Cane - single point;Cane - quad;Shower seat;Grab bars - tub/shower Additional Comments: 1 fall in last 6 months in bathtub    Prior Function Level of Independence: Independent         Comments: pt's granddaughter does her food shopping but she has been independent with moblity and ADL's/self care in home.     Hand Dominance   Dominant Hand: Right    Extremity/Trunk  Assessment   Upper Extremity Assessment Upper Extremity Assessment: Overall WFL for tasks assessed    Lower Extremity Assessment Lower Extremity Assessment: Generalized weakness    Cervical /  Trunk Assessment Cervical / Trunk Assessment: Normal  Communication   Communication: No difficulties  Cognition Arousal/Alertness: Awake/alert Behavior During Therapy: WFL for tasks assessed/performed Overall Cognitive Status: Within Functional Limits for tasks assessed           General Comments      Exercises     Assessment/Plan    PT Assessment Patient needs continued PT services  PT Problem List Decreased strength;Decreased activity tolerance;Decreased balance;Decreased mobility;Decreased knowledge of use of DME       PT Treatment Interventions DME instruction;Gait training;Stair training;Functional mobility training;Therapeutic activities;Therapeutic exercise;Balance training;Patient/family education    PT Goals (Current goals can be found in the Care Plan section)  Acute Rehab PT Goals Patient Stated Goal: to return home PT Goal Formulation: With patient Time For Goal Achievement: 04/19/19 Potential to Achieve Goals: Good    Frequency Min 3X/week    AM-PAC PT "6 Clicks" Mobility  Outcome Measure Help needed turning from your back to your side while in a flat bed without using bedrails?: A Little Help needed moving from lying on your back to sitting on the side of a flat bed without using bedrails?: A Little Help needed moving to and from a bed to a chair (including a wheelchair)?: A Little Help needed standing up from a chair using your arms (e.g., wheelchair or bedside chair)?: A Little Help needed to walk in hospital room?: A Little Help needed climbing 3-5 steps with a railing? : A Little 6 Click Score: 18    End of Session Equipment Utilized During Treatment: Gait belt Activity Tolerance: Patient tolerated treatment well Patient left: in chair;with call [place]/phone within reach;with chair alarm set Nurse Communication: Mobility status PT Visit Diagnosis: Muscle weakness (generalized) (M62.81);Difficulty in walking, not elsewhere classified  (R26.2);Unsteadiness on feet (R26.81)    Time: 1131-1200 PT Time Calculation (min) (ACUTE ONLY): 29 min   Charges:   PT Evaluation $PT Eval Moderate Complexity: 1 Mod PT Treatments $Gait Training: 8-22 mins      Verner Mould, DPT Physical Therapist with [facility] [phone]  04/05/2019 3:30 PM

## 2019-04-06 ENCOUNTER — Inpatient Hospital Stay (HOSPITAL_COMMUNITY): Payer: Medicare HMO

## 2019-04-06 DIAGNOSIS — R7881 Bacteremia: Secondary | ICD-10-CM

## 2019-04-06 LAB — CBC WITH DIFFERENTIAL/PLATELET
Abs Immature Granulocytes: 0.18 10*3/uL — ABNORMAL HIGH (ref 0.00–0.07)
Basophils Absolute: 0 10*3/uL (ref 0.0–0.1)
Basophils Relative: 0 %
Eosinophils Absolute: 0 10*3/uL (ref 0.0–0.5)
Eosinophils Relative: 0 %
HCT: 34.1 % — ABNORMAL LOW (ref 36.0–46.0)
Hemoglobin: 10.8 g/dL — ABNORMAL LOW (ref 12.0–15.0)
Immature Granulocytes: 2 %
Lymphocytes Relative: 10 %
Lymphs Abs: 1.2 10*3/uL (ref 0.7–4.0)
MCH: 30.9 pg (ref 26.0–34.0)
MCHC: 31.7 g/dL (ref 30.0–36.0)
MCV: 97.4 fL (ref 80.0–100.0)
Monocytes Absolute: 0.7 10*3/uL (ref 0.1–1.0)
Monocytes Relative: 6 %
Neutro Abs: 9.7 10*3/uL — ABNORMAL HIGH (ref 1.7–7.7)
Neutrophils Relative %: 82 %
Platelets: 135 10*3/uL — ABNORMAL LOW (ref 150–400)
RBC: 3.5 MIL/uL — ABNORMAL LOW (ref 3.87–5.11)
RDW: 14.2 % (ref 11.5–15.5)
WBC: 11.8 10*3/uL — ABNORMAL HIGH (ref 4.0–10.5)
nRBC: 0.4 % — ABNORMAL HIGH (ref 0.0–0.2)

## 2019-04-06 LAB — GLUCOSE, CAPILLARY
Glucose-Capillary: 82 mg/dL (ref 70–99)
Glucose-Capillary: 143 mg/dL — ABNORMAL HIGH (ref 70–99)

## 2019-04-06 LAB — BASIC METABOLIC PANEL
Anion gap: 5 (ref 5–15)
BUN: 27 mg/dL — ABNORMAL HIGH (ref 8–23)
CO2: 21 mmol/L — ABNORMAL LOW (ref 22–32)
Calcium: 9.2 mg/dL (ref 8.9–10.3)
Chloride: 115 mmol/L — ABNORMAL HIGH (ref 98–111)
Creatinine, Ser: 0.9 mg/dL (ref 0.44–1.00)
GFR calc Af Amer: >60 mL/min (ref >60)
GFR calc non Af Amer: 58 mL/min — ABNORMAL LOW (ref >60)
Glucose, Bld: 113 mg/dL — ABNORMAL HIGH (ref 70–99)
Potassium: 3.9 mmol/L (ref 3.5–5.1)
Sodium: 141 mmol/L (ref 135–145)

## 2019-04-06 LAB — CULTURE, BLOOD (ROUTINE X 2): Special Requests: ADEQUATE

## 2019-04-06 MED ORDER — AMOXICILLIN 500 MG PO CAPS: 500.0000 mg | ORAL_CAPSULE | Freq: Three times a day (TID) | ORAL | 0 refills | Status: AC

## 2019-04-06 MED ORDER — METHOTREXATE 2.5 MG PO TABS: ORAL_TABLET | ORAL | 0 refills | Status: DC

## 2019-04-06 MED ORDER — AMOXICILLIN 250 MG PO CAPS: 500.0000 mg | ORAL_CAPSULE | Freq: Three times a day (TID) | ORAL | Status: DC

## 2019-04-06 NOTE — Discharge Instructions (Signed)
Stacy Meyers,  You were in the hospital because of an infection and stone around your urinary system. You also have an infection in your blood. You had the urologist place a stent to help clear the block. You are also on antibiotics. Please follow-up with the urologist and also your primary care physician.

## 2019-04-06 NOTE — Discharge Summary (Signed)
Physician Discharge Summary  Stacy Meyers JOA:[id number] DOB: 09-15-1932 DOA: 04/03/2019  PCP: Fayrene Helper, MD  Admit date: 04/03/2019 Discharge date: 04/06/2019  Admitted From: Home Disposition: Home  Recommendations for Outpatient Follow-up:  1. Follow up with PCP in 1 week 2. Follow up with urology 3. Please follow up on the following pending results: None  Home Health: PT, OT Equipment/Devices: Rolling walker, 3 in 1  Discharge Condition: Stable CODE STATUS: Full code Diet recommendation: Heart healthy   Brief/Interim Summary:  Admission HPI written by Roxan Hockey, MD    HPI:   Stacy Meyers  is a 86 y.o. female with past medical history relevant for HFpEF, HLD, HTN, rheumatoid arthritis on methotrexate and prednisone therapy chronically presents to the ER with nausea vomiting and diarrhea of less than 36 hours duration  --- In the ED patient is found to be tachypneic, tachycardic and dehydrated--- UA was suspicious for UTI --Patient's recent urine culture from February 2021 grew pansensitive E. Coli -CT abdomen shows Moderate right-sided urinary tract obstruction secondary to a proximal right ureteric 1.4 cm calculus.  Bilateral nephrolithiasis.  -EDP consulted urologist Dr. Claudia Desanctis, requested hospitalist admit to [facility] long complex for further urological intervention for complicated UTI with obstructive uropathy and sepsis  patient met sepsis criteria on admission with WBC 13.5, heart rate 104, respiratory rate 31, hypotension with systolic blood pressure in the 70s to 80s, lactic acid elevated to 4.2 initially and acute kidney injury with creatinine up to 1.8 from a baseline of 0.8---after IV fluid boluses per sepsis protocol repeat lactic acid normalized at 0.7, heart rate improved/tachycardia resolved, BP improved with systolic blood pressure around 100   Hospital course:  Sepsis Present on admission and secondary to below problems. Treated  empirically with Zosyn IV initially and transitioned to Ceftriaxone IV. Sepsis physiology improved and resolved with stent placement and antibiotic treatment.  E. Coli Bacteremia E. Coli UTI; complicated Urine culture significant for pan-sensitive E. Coli. Blood culture significant for E. Coli with pending sensitivities. Antibiotics during hospitalization as mentioned above. Patient discharged on amoxicillin 500 mg TID to complete a 7 day course of treatment.  Obstructive uropathy 1.5 cm right proximal obstructing stone with associated infection. Patient underwent cystoscopy with stent placement on 3/29 by urology. Recommendations for outpatient urology follow-up in 1 week.  Rheumatoid arthritis Patient is on methotrexate, prednisone as an outpatient. Methotrexate held secondary to acute infection. Continue prednisone. Recommend to hold methotrexate until clear by physician.  Essential hypertension On amlodipine as an outpatient. Amlodipine held on admission secondary to sepsis.  Hyperlipidemia Continue Crestor  Diabetes mellitus, type 2 Patient is on diet control as an outpatient. Hemoglobin A1C of 6.3% from February 2021. Sliding scale used while admitted.  Chronic heart failure with preserved EF History. Last Transthoracic Echocardiogram from 2012. Euvolemic.  Osteoporosis On alendronate as an outpatient. Held on admission.  Hypermagnesemia Likely secondary to AKI. Trending down.  AKI Baseline creatinine of 0.8. Creatinine of 1.86 on admission. Secondary to obstructive uropathy and associated right-sided hydroureteronephrosis. Resolved prior to discharge with stent placement and IV fluids.  Metabolic acidosis Secondary to AKI. Resolved prior to discharge.   Discharge Diagnoses:  Principal Problem:   Sepsis secondary to UTI /Nephrolithiasis with obstructive uropathy Active Problems:   Rheumatoid arthritis with rheumatoid factor of multiple sites without organ or  systems involvement (HCC)   Type 2 diabetes, diet controlled (HCC)   Right nephrolithiasis-1.4cm Moderate right-sided urinary tract obstruction/hydronephrosis   Rt Sided Acute  unilateral obstructive uropathy- 1.4 cm Prox Ureteric Stone   H/o Chronic heart failure with preserved ejection fraction (HFpEF) [facility])    Discharge Instructions  Discharge Instructions    Call MD for:  temperature >100.4   Complete by: As directed      Allergies as of 04/06/2019      Reactions   Aspirin Other (See Comments)   REACTION: burns stomach (uncoated)   Sudafed [pseudoephedrine Hcl] Other (See Comments)   Increased joint pain      Medication List    TAKE these medications   acetaminophen 500 MG tablet Commonly known as: TYLENOL Take 1,000 mg by mouth every 6 (six) hours as needed (for pain).   acyclovir 400 MG tablet Commonly known as: ZOVIRAX TAKE 1 TABLET BY MOUTH TWICE A DAY   alendronate 70 MG tablet Commonly known as: FOSAMAX TAKE 1 TABLET BY MOUTH EVERY 7 DAYS. TAKE WITH A FULL GLASS OF WATER ON AN EMPTY STOMACH. What changed: See the new instructions.   amLODipine 5 MG tablet Commonly known as: NORVASC TAKE 1 TABLET BY MOUTH EVERY DAY   amoxicillin 500 MG capsule Commonly known as: AMOXIL Take 1 capsule (500 mg total) by mouth every 8 (eight) hours for 3 days. Start taking on: April 07, 2019   azelastine 0.1 % nasal spray Commonly known as: ASTELIN Place 2 sprays into both nostrils 2 (two) times daily. Use in each nostril as directed What changed:   when to take this  reasons to take this  additional instructions   ELDERBERRY PO Take 1 capsule by mouth daily.   folic acid 1 MG tablet Commonly known as: FOLVITE TAKE 1 TABLET BY MOUTH EVERY DAY   Klor-Con M20 20 MEQ tablet Generic drug: potassium chloride SA TAKE 1 TABLET (20 MEQ TOTAL) BY MOUTH DAILY.   methotrexate 2.5 MG tablet Commonly known as: RHEUMATREX Please discuss with your physician before restarting  this medication. What changed: See the new instructions.   montelukast 10 MG tablet Commonly known as: SINGULAIR TAKE 1 TABLET BY MOUTH EVERYDAY AT BEDTIME What changed: See the new instructions.   predniSONE 1 MG tablet Commonly known as: DELTASONE TAKE 4 TABLETS (4 MG TOTAL) BY MOUTH DAILY WITH BREAKFAST. What changed: See the new instructions.   rosuvastatin 5 MG tablet Commonly known as: CRESTOR TAKE 1 TABLET BY MOUTH EVERY OTHER DAY Notes to patient: Took on 4/1   SYSTANE BALANCE OP Place 1 drop into both eyes daily as needed (for dry eyes).   UNABLE TO FIND Diabetic shoes x 1 Inserts x 3  DX E11.9   UNABLE TO FIND Diabetic shoes x 1 inserts x 3  Dx e11.9      Follow-up Information    [facility] On 04/13/2019.   Why: Dr. Claudia Desanctis at 2:15pm Contact information: [place]       Care, [facility] Follow up.   Specialty: Home Health Services Why: [facility] physical therapy Contact information: [place] [address] [phone]        Fayrene Helper, MD. Schedule an appointment as soon as possible for a visit in 1 week(s).   Specialty: Family Medicine Why: Hospital follow-up Contact information: [address] [place] [address] [phone]        Arnoldo Lenis, MD .   Specialty: Cardiology Contact information: [address] [place] [address] [phone]  Allergies  Allergen Reactions  . Aspirin Other (See Comments)    REACTION: burns stomach (uncoated)  . Sudafed [Pseudoephedrine Hcl] Other (See Comments)    Increased joint pain    Consultations:  Urology   Procedures/Studies: DG CHEST PORT 1 VIEW  Result Date: 04/06/2019 CLINICAL DATA:  Productive cough. EXAM: PORTABLE CHEST 1 VIEW COMPARISON:  04/03/2019 FINDINGS: Severe bilateral shoulder arthropathy with probable chronic rotator cuff tears.  Atherosclerotic calcification of the aortic arch. Upper normal heart size with cardiac index of 57% on AP projection. Minimal blunting of the left lateral costophrenic angle. Mild dextroconvex thoracic scoliosis. IMPRESSION: 1. Minimal blunting of the left lateral costophrenic angle, possibly from trace pleural effusion. 2. Severe bilateral shoulder arthropathy. 3.  Aortic Atherosclerosis (ICD10-I70.0). Electronically Signed   By: Van Clines M.D.   On: 04/06/2019 09:48   DG Chest Port 1 View  Result Date: 04/03/2019 CLINICAL DATA:  Nausea, vomiting and diarrhea. EXAM: PORTABLE CHEST 1 VIEW COMPARISON:  06/17/2015 FINDINGS: The cardiac silhouette, mediastinal and hilar contours are within normal limits and stable given the AP projection and portable technique. Stable mild tortuosity and calcification of the thoracic aorta. Low lung volumes with streaky basilar atelectasis. Mild eventration of the right hemidiaphragm. The bony thorax is intact. Stable advanced degenerative changes involving both shoulders. IMPRESSION: Low lung volumes with streaky bibasilar atelectasis. No infiltrates, edema or effusions. Electronically Signed   By: Marijo Sanes M.D.   On: 04/03/2019 07:09   DG C-Arm 1-60 Min-No Report  Result Date: 04/03/2019 Fluoroscopy was utilized by the requesting physician.  No radiographic interpretation.   CT Renal Stone Study  Result Date: 04/03/2019 CLINICAL DATA:  Abdominal pain and nausea for 1 day. EXAM: CT ABDOMEN AND PELVIS WITHOUT CONTRAST TECHNIQUE: Multidetector CT imaging of the abdomen and pelvis was performed following the standard protocol without IV contrast. COMPARISON:  05/25/2016 FINDINGS: Lower chest: Dependent bibasilar atelectasis. Left lower lobe calcified granuloma. Tiny bilateral pleural effusions. Mild cardiomegaly. Multivessel coronary artery atherosclerosis. Hepatobiliary: High left hepatic lobe cyst. Cholecystectomy, without biliary ductal dilatation. Mild  motion degradation in the lower chest and abdomen. Pancreas: Grossly normal pancreas for age. Spleen: Normal in size, without focal abnormality. Adrenals/Urinary Tract: Normal adrenal glands. Small bilateral renal collecting system calculi. Interpolar left renal 2.4 cm cyst. Moderate right-sided hydroureteronephrosis to the level of a proximal right ureteric 1.0 x 1.4 cm stone, including on coronal image 46 transverse image 46. Perinephric edema and fluid, suggesting forniceal rupture. No bladder calculi. Stomach/Bowel: Tiny hiatal hernia. Extensive colonic diverticulosis. Lipoma within the ascending colon at 2.8 cm on 41/2. Normal terminal ileum and appendix. Normal small bowel. Vascular/Lymphatic: Aortic atherosclerosis. No abdominopelvic adenopathy. Reproductive: Fibroid uterus.  No adnexal mass. Other: No significant free fluid. Fat containing ventral abdominal wall hernia. Musculoskeletal: Advanced lumbosacral spondylosis. Grade 1-2 L4-5 anterolisthesis. IMPRESSION: 1. Moderate right-sided urinary tract obstruction secondary to a proximal right ureteric 1.4 cm calculus. 2. Bilateral nephrolithiasis. 3. Tiny bilateral pleural effusions. 4. Fibroid uterus. 5. Coronary artery atherosclerosis. Aortic Atherosclerosis (ICD10-I70.0). 6. Ascending colonic lipoma, without obstruction. Electronically Signed   By: Abigail Miyamoto M.D.   On: 04/03/2019 09:15      Subjective: No issues this morning.  Discharge Exam: Vitals:   04/05/19 2207 04/06/19 0524  BP: (!) 157/75 (!) 175/79  Pulse: 62 (!) 55  Resp: (!) 24 16  Temp: 98.6 F (37 C) 98 F (36.7 C)  SpO2: 96% 98%   Vitals:   04/05/19 0456 04/05/19 1259 04/05/19 2207 04/06/19  0524  BP: (!) 155/73 (!) 158/80 (!) 157/75 (!) 175/79  Pulse: 64 66 62 (!) 55  Resp: 18 17 (!) 24 16  Temp: 97.9 F (36.6 C) 98.9 F (37.2 C) 98.6 F (37 C) 98 F (36.7 C)  TempSrc: Oral Oral Oral Oral  SpO2: 95% 100% 96% 98%  Weight: 67.7 kg     Height:        General:  Pt is alert, awake, not in acute distress Cardiovascular: RRR, S1/S2 +, no rubs, no gallops Respiratory: CTA bilaterally, no wheezing, no rhonchi Abdominal: Soft, NT, ND, bowel sounds + Extremities: no edema, no cyanosis    The results of significant diagnostics from this hospitalization (including imaging, microbiology, ancillary and laboratory) are listed below for reference.     Microbiology: Recent Results (from the past 240 hour(s))  Urine culture     Status: Abnormal   Collection Time: 04/03/19  5:23 AM   Specimen: Urine, Clean Catch  Result Value Ref Range Status   Specimen Description   Final    URINE, CLEAN CATCH Performed at [facility], [address] [place], [place] [address]    Special Requests   Final    NONE Performed at [facility], [address], [place], [place] [address]    Culture >=100,000 COLONIES/mL ESCHERICHIA COLI (A)  Final   Report Status 04/05/2019 FINAL  Final   Organism ID, Bacteria ESCHERICHIA COLI (A)  Final      Susceptibility   Escherichia coli - MIC*    AMPICILLIN 8 SENSITIVE Sensitive     CEFAZOLIN <=4 SENSITIVE Sensitive     CEFTRIAXONE <=0.25 SENSITIVE Sensitive     CIPROFLOXACIN <=0.25 SENSITIVE Sensitive     GENTAMICIN <=1 SENSITIVE Sensitive     IMIPENEM <=0.25 SENSITIVE Sensitive     NITROFURANTOIN <=16 SENSITIVE Sensitive     TRIMETH/SULFA <=20 SENSITIVE Sensitive     AMPICILLIN/SULBACTAM <=2 SENSITIVE Sensitive     PIP/TAZO <=4 SENSITIVE Sensitive     * >=100,000 COLONIES/mL ESCHERICHIA COLI  Blood Culture (routine x 2)     Status: Abnormal   Collection Time: 04/03/19  7:08 AM   Specimen: BLOOD RIGHT ARM  Result Value Ref Range Status   Specimen Description   Final    BLOOD RIGHT ARM Performed at [facility], [address] [place], [place] [address]    Special Requests   Final    BOTTLES DRAWN AEROBIC AND ANAEROBIC Blood Culture adequate volume Performed at [facility], [address], [place],  [address]     Culture  Setup Time   Final    IN BOTH AEROBIC AND ANAEROBIC BOTTLES GRAM NEGATIVE RODS Gram Stain Report Called to,Read Back By and Verified With: H PHILLIPS,RN '@[id number]'  04/03/19 MKELLY CRITICAL RESULT CALLED TO, READ BACK BY AND VERIFIED WITH: B. GREEN,PHARMD 0253 04/04/2019 T. TYSOR    Culture ESCHERICHIA COLI (A)  Final   Report Status 04/06/2019 FINAL  Final   Organism ID, Bacteria ESCHERICHIA COLI  Final      Susceptibility   Escherichia coli - MIC*    AMPICILLIN 8 SENSITIVE Sensitive     CEFAZOLIN <=4 SENSITIVE Sensitive     CEFEPIME <=0.12 SENSITIVE Sensitive     CEFTAZIDIME <=1 SENSITIVE Sensitive     CEFTRIAXONE <=0.25 SENSITIVE Sensitive     CIPROFLOXACIN <=0.25 SENSITIVE Sensitive     GENTAMICIN <=1 SENSITIVE Sensitive     IMIPENEM <=0.25 SENSITIVE Sensitive     TRIMETH/SULFA <=20 SENSITIVE Sensitive  AMPICILLIN/SULBACTAM <=2 SENSITIVE Sensitive     PIP/TAZO <=4 SENSITIVE Sensitive     * ESCHERICHIA COLI  Blood Culture (routine x 2)     Status: None (Preliminary result)   Collection Time: 04/03/19  7:08 AM   Specimen: BLOOD RIGHT HAND  Result Value Ref Range Status   Specimen Description   Final    BLOOD RIGHT HAND Performed at [facility], [address] [place], [place] [address]    Special Requests   Final    BOTTLES DRAWN AEROBIC ONLY Blood Culture results may not be optimal due to an inadequate volume of blood received in culture bottles Performed at [facility], [address], [place], [place] [address]    Culture  Setup Time   Final    GRAM NEGATIVE RODS Gram Stain Report Called to,Read Back By and Verified With: HOLT,B ON 04/05/19 AT 1740 BY LOY,C AEROBIC BOTTLE PERFORMED AT [facility] Performed at [facility], [address], [place], [place] [address]    Culture GRAM NEGATIVE RODS  Final   Report Status PENDING  Incomplete  Blood Culture ID Panel (Reflexed)     Status: Abnormal   Collection Time: 04/03/19  7:08 AM  Result Value Ref Range Status    Enterococcus species NOT DETECTED NOT DETECTED Final   Listeria monocytogenes NOT DETECTED NOT DETECTED Final   Staphylococcus species NOT DETECTED NOT DETECTED Final   Staphylococcus aureus (BCID) NOT DETECTED NOT DETECTED Final   Streptococcus species NOT DETECTED NOT DETECTED Final   Streptococcus agalactiae NOT DETECTED NOT DETECTED Final   Streptococcus pneumoniae NOT DETECTED NOT DETECTED Final   Streptococcus pyogenes NOT DETECTED NOT DETECTED Final   Acinetobacter baumannii NOT DETECTED NOT DETECTED Final   Enterobacteriaceae species DETECTED (A) NOT DETECTED Final    Comment: Enterobacteriaceae represent a large family of gram-negative bacteria, not a single organism. CRITICAL RESULT CALLED TO, READ BACK BY AND VERIFIED WITH: B. GREEN,PHARMD 0253 04/04/2019 T. TYSOR    Enterobacter cloacae complex NOT DETECTED NOT DETECTED Final   Escherichia coli DETECTED (A) NOT DETECTED Final    Comment: CRITICAL RESULT CALLED TO, READ BACK BY AND VERIFIED WITH: B. GREEN,PHARMD 0253 04/04/2019 T. TYSOR    Klebsiella oxytoca NOT DETECTED NOT DETECTED Final   Klebsiella pneumoniae NOT DETECTED NOT DETECTED Final   Proteus species NOT DETECTED NOT DETECTED Final   Serratia marcescens NOT DETECTED NOT DETECTED Final   Carbapenem resistance NOT DETECTED NOT DETECTED Final   Haemophilus influenzae NOT DETECTED NOT DETECTED Final   Neisseria meningitidis NOT DETECTED NOT DETECTED Final   Pseudomonas aeruginosa NOT DETECTED NOT DETECTED Final   Candida albicans NOT DETECTED NOT DETECTED Final   Candida glabrata NOT DETECTED NOT DETECTED Final   Candida krusei NOT DETECTED NOT DETECTED Final   Candida parapsilosis NOT DETECTED NOT DETECTED Final   Candida tropicalis NOT DETECTED NOT DETECTED Final  Respiratory Panel by RT PCR (Flu A&B, Covid) - Nasopharyngeal Swab     Status: None   Collection Time: 04/03/19  8:26 AM   Specimen: Nasopharyngeal Swab  Result Value Ref Range Status   SARS  Coronavirus 2 by RT PCR NEGATIVE NEGATIVE Final    Comment: (NOTE) SARS-CoV-2 target nucleic acids are NOT DETECTED. The SARS-CoV-2 RNA is generally detectable in upper respiratoy specimens during the acute phase of infection. The lowest concentration of SARS-CoV-2 viral copies this assay can detect is 131 copies/mL. A negative result does not preclude SARS-Cov-2 infection and should not be used as the  sole basis for treatment or other patient management decisions. A negative result may occur with  improper specimen collection/handling, submission of specimen other than nasopharyngeal swab, presence of viral mutation(s) within the areas targeted by this assay, and inadequate number of viral copies (<131 copies/mL). A negative result must be combined with clinical observations, patient history, and epidemiological information. The expected result is Negative. Fact Sheet for Patients:  PinkCheek.be Fact Sheet for Healthcare Providers:  GravelBags.it This test is not yet ap proved or cleared by the [place] FDA and  has been authorized for detection and/or diagnosis of SARS-CoV-2 by FDA under an Emergency Use Authorization (EUA). This EUA will remain  in effect (meaning this test can be used) for the duration of the COVID-19 declaration under Section 564(b)(1) of the Act, 21 U.S.C. section 360bbb-3(b)(1), unless the authorization is terminated or revoked sooner.    Influenza A by PCR NEGATIVE NEGATIVE Final   Influenza B by PCR NEGATIVE NEGATIVE Final    Comment: (NOTE) The Xpert Xpress SARS-CoV-2/FLU/RSV assay is intended as an aid in  the diagnosis of influenza from Nasopharyngeal swab specimens and  should not be used as a sole basis for treatment. Nasal washings and  aspirates are unacceptable for Xpert Xpress SARS-CoV-2/FLU/RSV  testing. Fact Sheet for Patients: PinkCheek.be Fact Sheet  for Healthcare Providers: GravelBags.it This test is not yet approved or cleared by the [place] FDA and  has been authorized for detection and/or diagnosis of SARS-CoV-2 by  FDA under an Emergency Use Authorization (EUA). This EUA will remain  in effect (meaning this test can be used) for the duration of the  Covid-19 declaration under Section 564(b)(1) of the Act, 21  U.S.C. section 360bbb-3(b)(1), unless the authorization is  terminated or revoked. Performed at [facility], [address] [place], [place] [address]   Urine Culture     Status: Abnormal   Collection Time: 04/03/19 12:32 PM   Specimen: Urine, Clean Catch  Result Value Ref Range Status   Specimen Description   Final    URINE, CLEAN CATCH Performed at [facility], [place] [address] [place], [place] [address]    Special Requests NONE  Final   Culture (A)  Final    <10,000 COLONIES/mL INSIGNIFICANT GROWTH Performed at [facility], [place] [address] [place], [place] [address]    Report Status 04/04/2019 FINAL  Final     Labs: BNP (last 3 results) No results for input(s): BNP in the last 8760 hours. Basic Metabolic Panel: Recent Labs  Lab 04/03/19 0604 04/03/19 1356 04/04/19 0518 04/05/19 0512 04/06/19 0443  NA 139  --  139 142 141  K 3.1*  --  4.3 4.3 3.9  CL 108  --  113* 116* 115*  CO2 22  --  18* 18* 21*  GLUCOSE 141*  --  138* 104* 113*  BUN 31*  --  27* 31* 27*  CREATININE 1.86*  --  1.30* 1.04* 0.90  CALCIUM 8.3*  --  8.3* 8.7* 9.2  MG  --  1.5* 3.0* 2.7*  --   PHOS  --   --  2.4* 2.2*  --    Liver Function Tests: Recent Labs  Lab 04/03/19 0604  AST 21  ALT 13  ALKPHOS 83  BILITOT 0.8  PROT 5.1*  ALBUMIN 2.6*   No results for input(s): LIPASE, AMYLASE in the last 168 hours. No results for input(s): AMMONIA in the last 168 hours. CBC: Recent Labs  Lab  04/03/19 0604 04/04/19 0518 04/05/19 0512 04/06/19 0443  WBC 13.5* 20.6*  17.5* 11.8*  NEUTROABS 12.4*  --  15.9* 9.7*  HGB 10.3* 10.7* 10.7* 10.8*  HCT 32.8* 33.7* 33.6* 34.1*  MCV 101.2* 99.4 98.8 97.4  PLT 129* 135* 129* 135*   Cardiac Enzymes: No results for input(s): CKTOTAL, CKMB, CKMBINDEX, TROPONINI in the last 168 hours. BNP: Invalid input(s): POCBNP CBG: Recent Labs  Lab 04/05/19 1133 04/05/19 1636 04/05/19 2200 04/06/19 0732 04/06/19 1153  GLUCAP 92 172* 157* 82 143*   D-Dimer No results for input(s): DDIMER in the last 72 hours. Hgb A1c No results for input(s): HGBA1C in the last 72 hours. Lipid Profile No results for input(s): CHOL, HDL, LDLCALC, TRIG, CHOLHDL, LDLDIRECT in the last 72 hours. Thyroid function studies No results for input(s): TSH, T4TOTAL, T3FREE, THYROIDAB in the last 72 hours.  Invalid input(s): FREET3 Anemia work up No results for input(s): VITAMINB12, FOLATE, FERRITIN, TIBC, IRON, RETICCTPCT in the last 72 hours. Urinalysis    Component Value Date/Time   COLORURINE AMBER (A) 04/03/2019 0523   APPEARANCEUR CLOUDY (A) 04/03/2019 0523   LABSPEC 1.017 04/03/2019 0523   PHURINE 5.0 04/03/2019 0523   GLUCOSEU NEGATIVE 04/03/2019 0523   HGBUR SMALL (A) 04/03/2019 0523   BILIRUBINUR NEGATIVE 04/03/2019 0523   BILIRUBINUR small (A) 02/06/2019 1616   BILIRUBINUR neg 12/04/2013 0920   KETONESUR NEGATIVE 04/03/2019 0523   PROTEINUR 100 (A) 04/03/2019 0523   UROBILINOGEN 0.2 02/06/2019 1616   UROBILINOGEN 0.2 06/01/2012 1140   NITRITE NEGATIVE 04/03/2019 0523   LEUKOCYTESUR LARGE (A) 04/03/2019 0523   Sepsis Labs Invalid input(s): PROCALCITONIN,  WBC,  LACTICIDVEN Microbiology Recent Results (from the past 240 hour(s))  Urine culture     Status: Abnormal   Collection Time: 04/03/19  5:23 AM   Specimen: Urine, Clean Catch  Result Value Ref Range Status   Specimen Description   Final    URINE, CLEAN CATCH Performed at [facility], [address] [place], [place] [address]    Special Requests   Final     NONE Performed at [facility], [address] [place], [place] [address]    Culture >=100,000 COLONIES/mL ESCHERICHIA COLI (A)  Final   Report Status 04/05/2019 FINAL  Final   Organism ID, Bacteria ESCHERICHIA COLI (A)  Final      Susceptibility   Escherichia coli - MIC*    AMPICILLIN 8 SENSITIVE Sensitive     CEFAZOLIN <=4 SENSITIVE Sensitive     CEFTRIAXONE <=0.25 SENSITIVE Sensitive     CIPROFLOXACIN <=0.25 SENSITIVE Sensitive     GENTAMICIN <=1 SENSITIVE Sensitive     IMIPENEM <=0.25 SENSITIVE Sensitive     NITROFURANTOIN <=16 SENSITIVE Sensitive     TRIMETH/SULFA <=20 SENSITIVE Sensitive     AMPICILLIN/SULBACTAM <=2 SENSITIVE Sensitive     PIP/TAZO <=4 SENSITIVE Sensitive     * >=100,000 COLONIES/mL ESCHERICHIA COLI  Blood Culture (routine x 2)     Status: Abnormal   Collection Time: 04/03/19  7:08 AM   Specimen: BLOOD RIGHT ARM  Result Value Ref Range Status   Specimen Description   Final    BLOOD RIGHT ARM Performed at [place] [facility], [address] [address] [place], [place] [address]    Special Requests   Final    BOTTLES DRAWN AEROBIC AND ANAEROBIC Blood Culture adequate volume Performed at [facility], [address], [place], [place] [address]    Culture  Setup Time   Final    IN BOTH  AEROBIC AND ANAEROBIC BOTTLES GRAM NEGATIVE RODS Gram Stain Report Called to,Read Back By and Verified With: H PHILLIPS,RN '@[id number]'  04/03/19 MKELLY CRITICAL RESULT CALLED TO, READ BACK BY AND VERIFIED WITH: B. GREEN,PHARMD 0253 04/04/2019 T. TYSOR    Culture ESCHERICHIA COLI (A)  Final   Report Status 04/06/2019 FINAL  Final   Organism ID, Bacteria ESCHERICHIA COLI  Final      Susceptibility   Escherichia coli - MIC*    AMPICILLIN 8 SENSITIVE Sensitive     CEFAZOLIN <=4 SENSITIVE Sensitive     CEFEPIME <=0.12 SENSITIVE Sensitive     CEFTAZIDIME <=1 SENSITIVE Sensitive     CEFTRIAXONE <=0.25 SENSITIVE Sensitive     CIPROFLOXACIN <=0.25 SENSITIVE Sensitive     GENTAMICIN <=1 SENSITIVE  Sensitive     IMIPENEM <=0.25 SENSITIVE Sensitive     TRIMETH/SULFA <=20 SENSITIVE Sensitive     AMPICILLIN/SULBACTAM <=2 SENSITIVE Sensitive     PIP/TAZO <=4 SENSITIVE Sensitive     * ESCHERICHIA COLI  Blood Culture (routine x 2)     Status: None (Preliminary result)   Collection Time: 04/03/19  7:08 AM   Specimen: BLOOD RIGHT HAND  Result Value Ref Range Status   Specimen Description   Final    BLOOD RIGHT HAND Performed at [facility], [address], [place], [place] [address]    Special Requests   Final    BOTTLES DRAWN AEROBIC ONLY Blood Culture results may not be optimal due to an inadequate volume of blood received in culture bottles Performed at [facility], [address] [place], [place] [address]    Culture  Setup Time   Final    GRAM NEGATIVE RODS Gram Stain Report Called to,Read Back By and Verified With: HOLT,B ON 04/05/19 AT 1740 BY LOY,C AEROBIC BOTTLE PERFORMED AT [facility] Performed at [facility], [address], [place], [place] [address]    Culture GRAM NEGATIVE RODS  Final   Report Status PENDING  Incomplete  Blood Culture ID Panel (Reflexed)     Status: Abnormal   Collection Time: 04/03/19  7:08 AM  Result Value Ref Range Status   Enterococcus species NOT DETECTED NOT DETECTED Final   Listeria monocytogenes NOT DETECTED NOT DETECTED Final   Staphylococcus species NOT DETECTED NOT DETECTED Final   Staphylococcus aureus (BCID) NOT DETECTED NOT DETECTED Final   Streptococcus species NOT DETECTED NOT DETECTED Final   Streptococcus agalactiae NOT DETECTED NOT DETECTED Final   Streptococcus pneumoniae NOT DETECTED NOT DETECTED Final   Streptococcus pyogenes NOT DETECTED NOT DETECTED Final   Acinetobacter baumannii NOT DETECTED NOT DETECTED Final   Enterobacteriaceae species DETECTED (A) NOT DETECTED Final    Comment: Enterobacteriaceae represent a large family of gram-negative bacteria, not a single organism. CRITICAL RESULT CALLED TO, READ BACK BY AND VERIFIED  WITH: B. GREEN,PHARMD 0253 04/04/2019 T. TYSOR    Enterobacter cloacae complex NOT DETECTED NOT DETECTED Final   Escherichia coli DETECTED (A) NOT DETECTED Final    Comment: CRITICAL RESULT CALLED TO, READ BACK BY AND VERIFIED WITH: B. GREEN,PHARMD 0253 04/04/2019 T. TYSOR    Klebsiella oxytoca NOT DETECTED NOT DETECTED Final   Klebsiella pneumoniae NOT DETECTED NOT DETECTED Final   Proteus species NOT DETECTED NOT DETECTED Final   Serratia marcescens NOT DETECTED NOT DETECTED Final   Carbapenem resistance NOT DETECTED NOT DETECTED Final   Haemophilus influenzae NOT DETECTED NOT DETECTED Final   Neisseria meningitidis NOT DETECTED NOT DETECTED Final   Pseudomonas aeruginosa NOT DETECTED NOT DETECTED  Final   Candida albicans NOT DETECTED NOT DETECTED Final   Candida glabrata NOT DETECTED NOT DETECTED Final   Candida krusei NOT DETECTED NOT DETECTED Final   Candida parapsilosis NOT DETECTED NOT DETECTED Final   Candida tropicalis NOT DETECTED NOT DETECTED Final  Respiratory Panel by RT PCR (Flu A&B, Covid) - Nasopharyngeal Swab     Status: None   Collection Time: 04/03/19  8:26 AM   Specimen: Nasopharyngeal Swab  Result Value Ref Range Status   SARS Coronavirus 2 by RT PCR NEGATIVE NEGATIVE Final    Comment: (NOTE) SARS-CoV-2 target nucleic acids are NOT DETECTED. The SARS-CoV-2 RNA is generally detectable in upper respiratoy specimens during the acute phase of infection. The lowest concentration of SARS-CoV-2 viral copies this assay can detect is 131 copies/mL. A negative result does not preclude SARS-Cov-2 infection and should not be used as the sole basis for treatment or other patient management decisions. A negative result may occur with  improper specimen collection/handling, submission of specimen other than nasopharyngeal swab, presence of viral mutation(s) within the areas targeted by this assay, and inadequate number of viral copies (<131 copies/mL). A negative result  must be combined with clinical observations, patient history, and epidemiological information. The expected result is Negative. Fact Sheet for Patients:  PinkCheek.be Fact Sheet for Healthcare Providers:  GravelBags.it This test is not yet ap proved or cleared by the [place] FDA and  has been authorized for detection and/or diagnosis of SARS-CoV-2 by FDA under an Emergency Use Authorization (EUA). This EUA will remain  in effect (meaning this test can be used) for the duration of the COVID-19 declaration under Section 564(b)(1) of the Act, 21 U.S.C. section 360bbb-3(b)(1), unless the authorization is terminated or revoked sooner.    Influenza A by PCR NEGATIVE NEGATIVE Final   Influenza B by PCR NEGATIVE NEGATIVE Final    Comment: (NOTE) The Xpert Xpress SARS-CoV-2/FLU/RSV assay is intended as an aid in  the diagnosis of influenza from Nasopharyngeal swab specimens and  should not be used as a sole basis for treatment. Nasal washings and  aspirates are unacceptable for Xpert Xpress SARS-CoV-2/FLU/RSV  testing. Fact Sheet for Patients: PinkCheek.be Fact Sheet for Healthcare Providers: GravelBags.it This test is not yet approved or cleared by the [place] FDA and  has been authorized for detection and/or diagnosis of SARS-CoV-2 by  FDA under an Emergency Use Authorization (EUA). This EUA will remain  in effect (meaning this test can be used) for the duration of the  Covid-19 declaration under Section 564(b)(1) of the Act, 21  U.S.C. section 360bbb-3(b)(1), unless the authorization is  terminated or revoked. Performed at [facility]: [address], [address] [place], [place] [address]   Urine Culture     Status: Abnormal   Collection Time: 04/03/19 12:32 PM   Specimen: Urine, Clean Catch  Result Value Ref Range Status   Specimen Description   Final    URINE, CLEAN  CATCH Performed at [facility], [place] [address] [place], [place] [address]    Special Requests NONE  Final   Culture (A)  Final    <10,000 COLONIES/mL INSIGNIFICANT GROWTH Performed at [facility], [place] [address], [place], [place] [address]    Report Status 04/04/2019 FINAL  Final     Time coordinating discharge: 35 minutes  SIGNED:   Cordelia Poche, MD [facility] 04/06/2019, 2:16 PM

## 2019-04-06 NOTE — Care Management Important Message (Signed)
Important Message  Patient Details IM Letter given to Dessa Phi RN Case Manager to present to the Patient Name: Stacy Meyers MRN: [id number] Date of Birth: 01/21/1932   Medicare Important Message Given:  Yes     Kerin Salen 04/06/2019, 11:06 AM

## 2019-04-06 NOTE — TOC Transition Note (Signed)
Transition of Care [facility]) - CM/SW Discharge Note   Patient Details  Name: Stacy Meyers MRN: [id number] Date of Birth: September 28, 1932  Transition of Care [facility]) CM/SW Contact:  Dessa Phi, RN Phone Number: 04/06/2019, 12:53 PM   Clinical Narrative: Lenn Cal aware of HHPT. MD message sent for face to face order for HHPT.No further CM needs.      Final next level of care: [place] Barriers to Discharge: No Barriers Identified   Patient Goals and CMS Choice Patient states their goals for this hospitalization and ongoing recovery are:: go home CMS Medicare.gov Compare Post Acute Care list provided to:: Patient Represenative (must comment)(grand dtr Marilynn Latino)    Discharge Placement                       Discharge Plan and Services   Discharge Planning Services: CM Consult                      HH Arranged: PT [facility] Agency: [place] Date [place]: 04/06/19 Time [place]: 1253 Representative spoke with at [place]: [place] Determinants of Health ([place]) Interventions     Readmission Risk Interventions No flowsheet data found.

## 2019-04-07 ENCOUNTER — Telehealth: Payer: Self-pay | Admitting: *Deleted

## 2019-04-07 DIAGNOSIS — I110 Hypertensive heart disease with heart failure: Secondary | ICD-10-CM | POA: Diagnosis not present

## 2019-04-07 DIAGNOSIS — E119 Type 2 diabetes mellitus without complications: Secondary | ICD-10-CM | POA: Diagnosis not present

## 2019-04-07 DIAGNOSIS — M0579 Rheumatoid arthritis with rheumatoid factor of multiple sites without organ or systems involvement: Secondary | ICD-10-CM | POA: Diagnosis not present

## 2019-04-07 DIAGNOSIS — M47817 Spondylosis without myelopathy or radiculopathy, lumbosacral region: Secondary | ICD-10-CM | POA: Diagnosis not present

## 2019-04-07 DIAGNOSIS — N136 Pyonephrosis: Secondary | ICD-10-CM | POA: Diagnosis not present

## 2019-04-07 DIAGNOSIS — I5032 Chronic diastolic (congestive) heart failure: Secondary | ICD-10-CM | POA: Diagnosis not present

## 2019-04-07 DIAGNOSIS — I2510 Atherosclerotic heart disease of native coronary artery without angina pectoris: Secondary | ICD-10-CM | POA: Diagnosis not present

## 2019-04-07 DIAGNOSIS — A4151 Sepsis due to Escherichia coli [E. coli]: Secondary | ICD-10-CM | POA: Diagnosis not present

## 2019-04-07 DIAGNOSIS — M4316 Spondylolisthesis, lumbar region: Secondary | ICD-10-CM | POA: Diagnosis not present

## 2019-04-07 DIAGNOSIS — M1711 Unilateral primary osteoarthritis, right knee: Secondary | ICD-10-CM | POA: Diagnosis not present

## 2019-04-07 LAB — CULTURE, BLOOD (ROUTINE X 2)

## 2019-04-07 NOTE — Telephone Encounter (Signed)
Transition Care Management Follow-up Telephone Call   Date discharged? 04-06-19   How have you been since you were released from the hospital? she is better they are still treating with antibiotics    Do you understand why you were in the hospital? Yes    Do you understand the discharge instructions? Yes    Where were you discharged to? Home     Items Reviewed:  Medications reviewed: yes    Allergies reviewed: yes  Dietary changes reviewed: yes  Referrals reviewed: yes    Functional Questionnaire:   Activities of Daily Living (ADLs):  physical therapy is coming to her home and working with her to regain mobility     Any transportation issues/concerns?: no    Any patient concerns? no    Confirmed importance and date/time of follow-up visits scheduled yes      Confirmed with patient if condition begins to worsen call PCP or go to the ER.  Patient was given the office number and encouraged to call back with question or concerns.  :

## 2019-04-10 ENCOUNTER — Telehealth: Payer: Self-pay

## 2019-04-10 DIAGNOSIS — I110 Hypertensive heart disease with heart failure: Secondary | ICD-10-CM | POA: Diagnosis not present

## 2019-04-10 DIAGNOSIS — M0579 Rheumatoid arthritis with rheumatoid factor of multiple sites without organ or systems involvement: Secondary | ICD-10-CM | POA: Diagnosis not present

## 2019-04-10 DIAGNOSIS — I2510 Atherosclerotic heart disease of native coronary artery without angina pectoris: Secondary | ICD-10-CM | POA: Diagnosis not present

## 2019-04-10 DIAGNOSIS — N136 Pyonephrosis: Secondary | ICD-10-CM | POA: Diagnosis not present

## 2019-04-10 DIAGNOSIS — M4316 Spondylolisthesis, lumbar region: Secondary | ICD-10-CM | POA: Diagnosis not present

## 2019-04-10 DIAGNOSIS — I5032 Chronic diastolic (congestive) heart failure: Secondary | ICD-10-CM | POA: Diagnosis not present

## 2019-04-10 DIAGNOSIS — A4151 Sepsis due to Escherichia coli [E. coli]: Secondary | ICD-10-CM | POA: Diagnosis not present

## 2019-04-10 DIAGNOSIS — M1711 Unilateral primary osteoarthritis, right knee: Secondary | ICD-10-CM | POA: Diagnosis not present

## 2019-04-10 DIAGNOSIS — E119 Type 2 diabetes mellitus without complications: Secondary | ICD-10-CM | POA: Diagnosis not present

## 2019-04-10 DIAGNOSIS — M47817 Spondylosis without myelopathy or radiculopathy, lumbosacral region: Secondary | ICD-10-CM | POA: Diagnosis not present

## 2019-04-10 NOTE — Telephone Encounter (Signed)
Needs Verbal orders for Home Health & Occ therapy   2 x 2 weeks 1 x 1 week

## 2019-04-11 DIAGNOSIS — I110 Hypertensive heart disease with heart failure: Secondary | ICD-10-CM | POA: Diagnosis not present

## 2019-04-11 DIAGNOSIS — E119 Type 2 diabetes mellitus without complications: Secondary | ICD-10-CM | POA: Diagnosis not present

## 2019-04-11 DIAGNOSIS — M47817 Spondylosis without myelopathy or radiculopathy, lumbosacral region: Secondary | ICD-10-CM | POA: Diagnosis not present

## 2019-04-11 DIAGNOSIS — M1711 Unilateral primary osteoarthritis, right knee: Secondary | ICD-10-CM | POA: Diagnosis not present

## 2019-04-11 DIAGNOSIS — A4151 Sepsis due to Escherichia coli [E. coli]: Secondary | ICD-10-CM | POA: Diagnosis not present

## 2019-04-11 DIAGNOSIS — I2510 Atherosclerotic heart disease of native coronary artery without angina pectoris: Secondary | ICD-10-CM | POA: Diagnosis not present

## 2019-04-11 DIAGNOSIS — M4316 Spondylolisthesis, lumbar region: Secondary | ICD-10-CM | POA: Diagnosis not present

## 2019-04-11 DIAGNOSIS — M0579 Rheumatoid arthritis with rheumatoid factor of multiple sites without organ or systems involvement: Secondary | ICD-10-CM | POA: Diagnosis not present

## 2019-04-11 DIAGNOSIS — I5032 Chronic diastolic (congestive) heart failure: Secondary | ICD-10-CM | POA: Diagnosis not present

## 2019-04-11 DIAGNOSIS — N136 Pyonephrosis: Secondary | ICD-10-CM | POA: Diagnosis not present

## 2019-04-11 NOTE — Telephone Encounter (Signed)
Verbal orders given  

## 2019-04-12 ENCOUNTER — Encounter: Payer: Self-pay | Admitting: Family Medicine

## 2019-04-12 ENCOUNTER — Other Ambulatory Visit: Payer: Self-pay | Admitting: Family Medicine

## 2019-04-12 ENCOUNTER — Other Ambulatory Visit: Payer: Self-pay

## 2019-04-12 ENCOUNTER — Ambulatory Visit (INDEPENDENT_AMBULATORY_CARE_PROVIDER_SITE_OTHER): Payer: Medicare HMO | Admitting: Family Medicine

## 2019-04-12 VITALS — BP 138/67 | HR 78 | Temp 97.8°F | Ht 59.0 in | Wt 137.6 lb

## 2019-04-12 DIAGNOSIS — B9620 Unspecified Escherichia coli [E. coli] as the cause of diseases classified elsewhere: Secondary | ICD-10-CM

## 2019-04-12 DIAGNOSIS — N390 Urinary tract infection, site not specified: Secondary | ICD-10-CM | POA: Diagnosis not present

## 2019-04-12 DIAGNOSIS — R7881 Bacteremia: Secondary | ICD-10-CM | POA: Diagnosis not present

## 2019-04-12 LAB — POCT URINALYSIS DIP (MANUAL ENTRY)
Bilirubin, UA: NEGATIVE
Glucose, UA: NEGATIVE mg/dL
Ketones, POC UA: NEGATIVE mg/dL
Nitrite, UA: NEGATIVE
Protein Ur, POC: NEGATIVE mg/dL
Spec Grav, UA: 1.02 (ref 1.010–1.025)
Urobilinogen, UA: 0.2 E.U./dL
pH, UA: 7 (ref 5.0–8.0)

## 2019-04-12 NOTE — Patient Instructions (Addendum)
  Keep urology appointment Contact you about results of the urine culture   If you have lab work done today you will be contacted with your lab results within the next 2 weeks.  If you have not heard from [place] then please contact us. The fastest way to get your results is to register for My Chart.   IF you received an x-ray today, you will receive an invoice from [facility]. Please contact [facility] at [phone] with questions or concerns regarding your invoice.   IF you received labwork today, you will receive an invoice from [facility]. Please contact [facility] at [phone] with questions or concerns regarding your invoice.   Our billing staff will not be able to assist you with questions regarding bills from these companies.  You will be contacted with the lab results as soon as they are available. The fastest way to get your results is to activate your My Chart account. Instructions are located on the last page of this paperwork. If you have not heard from [place] regarding the results in 2 weeks, please contact this office.

## 2019-04-12 NOTE — Progress Notes (Signed)
Established Patient Office Visit  Subjective:  Patient ID: Stacy Meyers, female    DOB: 04-Jan-1933  Age: 86 y.o. MRN: [id number]  CC:  Chief Complaint  Patient presents with  . Hospitalization Follow-up    was seen in the hospital on 04/06/19 for UTI.  Finished the abx  Nancy Fetter May 03, 2019    HPI Stacy Meyers presents for hospital follow up-sepsis due to UTI/Kidney stone.-blood culture +-e coli-completed all antibiotics- Cystoscopy 3/29-stent removal on Monday PT/OT-rehab post hospitalization to regain strength Daughter with pt today Past Medical History:  Diagnosis Date  . Acute cholecystitis 12/16/2010  . Anemia 12/14/2010  . ARF (acute renal failure) ([place]) 12/14/2010  . Bradycardia    Sinus - noted 6/11  . Cholelithiasis 12/14/2010  . Collagen vascular disease ([place])   . Diastolic dysfunction [id number]   Grade 1 per 2-D echocardiogram  . Dysrhythmia   . Essential hypertension, benign   . HTN (hypertension) 12/14/2010  . Hypercalcemia 12/14/2010  . Hypokalemia 12/14/2010  . Mixed hyperlipidemia   . Osteoarthritis   . Rheumatoid arthritis(714.0)     Past Surgical History:  Procedure Laterality Date  . Bilateral cataract surgery  2009  . BUNIONECTOMY Right 2012  . CHOLECYSTECTOMY  12/17/2010   Procedure: LAPAROSCOPIC CHOLECYSTECTOMY;  Surgeon: Donato Heinz;  Location: [facility] ORS;  Service: General;  Laterality: N/A;  . COLONOSCOPY  05/22/2011   Procedure: COLONOSCOPY;  Surgeon: Rogene Houston, MD;  Location: [facility] ENDO SUITE;  Service: Endoscopy;  Laterality: N/A;  1045  . CYSTOSCOPY WITH STENT PLACEMENT Right 04/03/2019   Procedure: CYSTOSCOPY, RIGHT RETROGRADE PYELOGRAM  WITH STENT PLACEMENT;  Surgeon: Robley Fries, MD;  Location: [facility] ORS;  Service: Urology;  Laterality: Right;  . EYE SURGERY Bilateral    cataract  . GANGLION CYST EXCISION Left 06/02/2012   Procedure: Excision ganglion left dorsal wrist;  Surgeon: Sanjuana Kava, MD;  Location: [facility] ORS;  Service: Orthopedics;   Laterality: Left;  . L2-L3 discectomy  2002  . L3-L4, L4-L5 lumbar decompression  2011  . Right shoulder cyst excision  2010  . SPINE SURGERY     twice, approx 2006, 2011    Family History  Problem Relation Age of Onset  . Stroke Mother   . Cancer Father   . Diabetes Sister   . Diabetes Sister   . Kidney failure Brother   . Kidney failure Brother   . Pancreatic cancer Sister   . Kidney Stones Sister        Only one Kidney  . Arthritis Sister   . Heart disease Daughter     Social History   Socioeconomic History  . Marital status: Widowed    Spouse name: Not on file  . Number of children: 1  . Years of education: Not on file  . Highest education level: Not on file  Occupational History  . Occupation: Retired    Comment: [employer]  Tobacco Use  . Smoking status: Never Smoker  . Smokeless tobacco: Never Used  . Tobacco comment: Second hand smoke   Substance and Sexual Activity  . Alcohol use: No  . Drug use: Never  . Sexual activity: Not Currently  Other Topics Concern  . Not on file  Social History Narrative   Husband passed away 2016/05/02. It has been financially harder to pay the bills since his passing, but she is making it. Has grandchildren around which makes it easier.    Social Determinants of Health   Financial  Resource Strain: Low Risk   . Difficulty of Paying Living Expenses: Not very hard  Food Insecurity: No Food Insecurity  . Worried About Charity fundraiser in the Last Year: Never true  . Ran Out of Food in the Last Year: Never true  Transportation Needs:   . Lack of Transportation (Medical):   Marland Kitchen Lack of Transportation (Non-Medical):   Physical Activity: Sufficiently Active  . Days of Exercise per Week: 7 days  . Minutes of Exercise per Session: 40 min  Stress:   . Feeling of Stress :   Social Connections: Unknown  . Frequency of Communication with Friends and Family: Not on file  . Frequency of Social Gatherings with Friends and Family:  More than three times a week  . Attends Religious Services: Not on file  . Active Member of Clubs or Organizations: Not on file  . Attends Archivist Meetings: Not on file  . Marital Status: Not on file  Intimate Partner Violence:   . Fear of Current or Ex-Partner:   . Emotionally Abused:   Marland Kitchen Physically Abused:   . Sexually Abused:     Outpatient Medications Prior to Visit  Medication Sig Dispense Refill  . acetaminophen (TYLENOL) 500 MG tablet Take 1,000 mg by mouth every 6 (six) hours as needed (for pain).     Marland Kitchen acyclovir (ZOVIRAX) 400 MG tablet TAKE 1 TABLET BY MOUTH TWICE A DAY 180 tablet 1  . alendronate (FOSAMAX) 70 MG tablet TAKE 1 TABLET BY MOUTH EVERY 7 DAYS. TAKE WITH A FULL GLASS OF WATER ON AN EMPTY STOMACH. (Patient taking differently: Take 70 mg by mouth every 7 (seven) days. ) 12 tablet 1  . amLODipine (NORVASC) 5 MG tablet TAKE 1 TABLET BY MOUTH EVERY DAY (Patient taking differently: Take 5 mg by mouth daily. ) 90 tablet 0  . azelastine (ASTELIN) 0.1 % nasal spray Place 2 sprays into both nostrils 2 (two) times daily. Use in each nostril as directed (Patient taking differently: Place 2 sprays into both nostrils as needed for allergies. ) 30 mL 12  . ELDERBERRY PO Take 1 capsule by mouth daily.    . folic acid (FOLVITE) 1 MG tablet TAKE 1 TABLET BY MOUTH EVERY DAY (Patient taking differently: Take 1 mg by mouth daily. ) 90 tablet 4  . KLOR-CON M20 20 MEQ tablet TAKE 1 TABLET (20 MEQ TOTAL) BY MOUTH DAILY. 90 tablet 1  . methotrexate (RHEUMATREX) 2.5 MG tablet Please discuss with your physician before restarting this medication. 36 tablet 0  . montelukast (SINGULAIR) 10 MG tablet TAKE 1 TABLET BY MOUTH EVERYDAY AT BEDTIME (Patient taking differently: Take 10 mg by mouth at bedtime. ) 90 tablet 1  . predniSONE (DELTASONE) 1 MG tablet TAKE 4 TABLETS (4 MG TOTAL) BY MOUTH DAILY WITH BREAKFAST. (Patient taking differently: Take 4 mg by mouth daily with breakfast. ) 360  tablet 0  . Propylene Glycol (SYSTANE BALANCE OP) Place 1 drop into both eyes daily as needed (for dry eyes).     . rosuvastatin (CRESTOR) 5 MG tablet TAKE 1 TABLET BY MOUTH EVERY OTHER DAY 45 tablet 0  . UNABLE TO FIND Diabetic shoes x 1 Inserts x 3  DX E11.9 1 each 0  . UNABLE TO FIND Diabetic shoes x 1 inserts x 3  Dx e11.9 1 each 0   No facility-administered medications prior to visit.    Allergies  Allergen Reactions  . Aspirin Other (See Comments)  REACTION: burns stomach (uncoated)  . Sudafed [Pseudoephedrine Hcl] Other (See Comments)    Increased joint pain    ROS Review of Systems  Constitutional: Negative for fatigue and fever.  Endocrine: Negative for polyuria.  Genitourinary: Negative for dysuria, flank pain and frequency.      Objective:    Physical Exam  Constitutional: She is oriented to person, place, and time. She appears well-developed and well-nourished.  Cardiovascular: Normal rate, regular rhythm and normal heart sounds.  Pulmonary/Chest: Effort normal and breath sounds normal.  Neurological: She is oriented to person, place, and time.  Psychiatric: She has a normal mood and affect.    BP 138/67 (BP Location: Right Arm, Patient Position: Sitting, Cuff Size: Normal)   Pulse 78   Temp 97.8 F (36.6 C) (Temporal)   Ht 4\' 11"  (1.499 m)   Wt 137 lb 9.6 oz (62.4 kg)   SpO2 95%   BMI 27.79 kg/m  Wt Readings from Last 3 Encounters:  04/12/19 137 lb 9.6 oz (62.4 kg)  04/05/19 149 lb 4.8 oz (67.7 kg)  02/07/19 142 lb (64.4 kg)     Health Maintenance Due  Topic Date Due  . OPHTHALMOLOGY EXAM  11/30/2018   Lab Results  Component Value Date   TSH 1.95 01/13/2018   Lab Results  Component Value Date   WBC 11.8 (H) 04/06/2019   HGB 10.8 (L) 04/06/2019   HCT 34.1 (L) 04/06/2019   MCV 97.4 04/06/2019   PLT 135 (L) 04/06/2019   Lab Results  Component Value Date   NA 141 04/06/2019   K 3.9 04/06/2019   CO2 21 (L) 04/06/2019   GLUCOSE 113  (H) 04/06/2019   BUN 27 (H) 04/06/2019   CREATININE 0.90 04/06/2019   BILITOT 0.8 04/03/2019   ALKPHOS 83 04/03/2019   AST 21 04/03/2019   ALT 13 04/03/2019   PROT 5.1 (L) 04/03/2019   ALBUMIN 2.6 (L) 04/03/2019   CALCIUM 9.2 04/06/2019   ANIONGAP 5 04/06/2019   Lab Results  Component Value Date   CHOL 193 03/02/2019   Lab Results  Component Value Date   HDL 50 03/02/2019   Lab Results  Component Value Date   LDLCALC 124 (H) 03/02/2019   Lab Results  Component Value Date   TRIG 93 03/02/2019   Lab Results  Component Value Date   CHOLHDL 3.9 03/02/2019   Lab Results  Component Value Date   HGBA1C 6.3 (H) 03/02/2019      Assessment & Plan:  1. Urinary tract infection without hematuria, site unspecified - Urine Culture - POCT urinalysis dipstick Recheck-continue drinking water, avoid caffeine 2. E. coli bacteremia No fever, all medication completed, pt doing well in rehab  Follow-up: No follow-ups on file.    Tywone Bembenek Hannah Beat, MD

## 2019-04-13 DIAGNOSIS — M4316 Spondylolisthesis, lumbar region: Secondary | ICD-10-CM | POA: Diagnosis not present

## 2019-04-13 DIAGNOSIS — I5032 Chronic diastolic (congestive) heart failure: Secondary | ICD-10-CM | POA: Diagnosis not present

## 2019-04-13 DIAGNOSIS — M0579 Rheumatoid arthritis with rheumatoid factor of multiple sites without organ or systems involvement: Secondary | ICD-10-CM | POA: Diagnosis not present

## 2019-04-13 DIAGNOSIS — A4151 Sepsis due to Escherichia coli [E. coli]: Secondary | ICD-10-CM | POA: Diagnosis not present

## 2019-04-13 DIAGNOSIS — N136 Pyonephrosis: Secondary | ICD-10-CM | POA: Diagnosis not present

## 2019-04-13 DIAGNOSIS — M47817 Spondylosis without myelopathy or radiculopathy, lumbosacral region: Secondary | ICD-10-CM | POA: Diagnosis not present

## 2019-04-13 DIAGNOSIS — I110 Hypertensive heart disease with heart failure: Secondary | ICD-10-CM | POA: Diagnosis not present

## 2019-04-13 DIAGNOSIS — E119 Type 2 diabetes mellitus without complications: Secondary | ICD-10-CM | POA: Diagnosis not present

## 2019-04-13 DIAGNOSIS — M1711 Unilateral primary osteoarthritis, right knee: Secondary | ICD-10-CM | POA: Diagnosis not present

## 2019-04-13 DIAGNOSIS — I2510 Atherosclerotic heart disease of native coronary artery without angina pectoris: Secondary | ICD-10-CM | POA: Diagnosis not present

## 2019-04-13 LAB — URINE CULTURE
MICRO NUMBER:: 10337664
Result:: NO GROWTH
SPECIMEN QUALITY:: ADEQUATE

## 2019-04-13 LAB — URINALYSIS

## 2019-04-14 DIAGNOSIS — I110 Hypertensive heart disease with heart failure: Secondary | ICD-10-CM | POA: Diagnosis not present

## 2019-04-14 DIAGNOSIS — M4316 Spondylolisthesis, lumbar region: Secondary | ICD-10-CM | POA: Diagnosis not present

## 2019-04-14 DIAGNOSIS — I2510 Atherosclerotic heart disease of native coronary artery without angina pectoris: Secondary | ICD-10-CM | POA: Diagnosis not present

## 2019-04-14 DIAGNOSIS — E119 Type 2 diabetes mellitus without complications: Secondary | ICD-10-CM | POA: Diagnosis not present

## 2019-04-14 DIAGNOSIS — N136 Pyonephrosis: Secondary | ICD-10-CM | POA: Diagnosis not present

## 2019-04-14 DIAGNOSIS — I5032 Chronic diastolic (congestive) heart failure: Secondary | ICD-10-CM | POA: Diagnosis not present

## 2019-04-14 DIAGNOSIS — A4151 Sepsis due to Escherichia coli [E. coli]: Secondary | ICD-10-CM | POA: Diagnosis not present

## 2019-04-14 DIAGNOSIS — M1711 Unilateral primary osteoarthritis, right knee: Secondary | ICD-10-CM | POA: Diagnosis not present

## 2019-04-14 DIAGNOSIS — M47817 Spondylosis without myelopathy or radiculopathy, lumbosacral region: Secondary | ICD-10-CM | POA: Diagnosis not present

## 2019-04-14 DIAGNOSIS — M0579 Rheumatoid arthritis with rheumatoid factor of multiple sites without organ or systems involvement: Secondary | ICD-10-CM | POA: Diagnosis not present

## 2019-04-17 ENCOUNTER — Telehealth: Payer: Self-pay | Admitting: Family Medicine

## 2019-04-17 DIAGNOSIS — N201 Calculus of ureter: Secondary | ICD-10-CM | POA: Diagnosis not present

## 2019-04-17 DIAGNOSIS — I2510 Atherosclerotic heart disease of native coronary artery without angina pectoris: Secondary | ICD-10-CM | POA: Diagnosis not present

## 2019-04-17 DIAGNOSIS — M4316 Spondylolisthesis, lumbar region: Secondary | ICD-10-CM | POA: Diagnosis not present

## 2019-04-17 DIAGNOSIS — E119 Type 2 diabetes mellitus without complications: Secondary | ICD-10-CM | POA: Diagnosis not present

## 2019-04-17 DIAGNOSIS — I110 Hypertensive heart disease with heart failure: Secondary | ICD-10-CM | POA: Diagnosis not present

## 2019-04-17 DIAGNOSIS — M0579 Rheumatoid arthritis with rheumatoid factor of multiple sites without organ or systems involvement: Secondary | ICD-10-CM | POA: Diagnosis not present

## 2019-04-17 DIAGNOSIS — R8279 Other abnormal findings on microbiological examination of urine: Secondary | ICD-10-CM | POA: Diagnosis not present

## 2019-04-17 DIAGNOSIS — N136 Pyonephrosis: Secondary | ICD-10-CM | POA: Diagnosis not present

## 2019-04-17 DIAGNOSIS — M1711 Unilateral primary osteoarthritis, right knee: Secondary | ICD-10-CM | POA: Diagnosis not present

## 2019-04-17 DIAGNOSIS — I5032 Chronic diastolic (congestive) heart failure: Secondary | ICD-10-CM | POA: Diagnosis not present

## 2019-04-17 DIAGNOSIS — A4151 Sepsis due to Escherichia coli [E. coli]: Secondary | ICD-10-CM | POA: Diagnosis not present

## 2019-04-17 DIAGNOSIS — M47817 Spondylosis without myelopathy or radiculopathy, lumbosacral region: Secondary | ICD-10-CM | POA: Diagnosis not present

## 2019-04-17 NOTE — Telephone Encounter (Signed)
Pt granddaughter called and states they received a call in regards to patient urine culture results and would like a call back.   [phone] or [phone]

## 2019-04-18 ENCOUNTER — Telehealth: Payer: Self-pay | Admitting: Emergency Medicine

## 2019-04-18 DIAGNOSIS — M4316 Spondylolisthesis, lumbar region: Secondary | ICD-10-CM | POA: Diagnosis not present

## 2019-04-18 DIAGNOSIS — M1711 Unilateral primary osteoarthritis, right knee: Secondary | ICD-10-CM | POA: Diagnosis not present

## 2019-04-18 DIAGNOSIS — A4151 Sepsis due to Escherichia coli [E. coli]: Secondary | ICD-10-CM | POA: Diagnosis not present

## 2019-04-18 DIAGNOSIS — N136 Pyonephrosis: Secondary | ICD-10-CM | POA: Diagnosis not present

## 2019-04-18 DIAGNOSIS — I5032 Chronic diastolic (congestive) heart failure: Secondary | ICD-10-CM | POA: Diagnosis not present

## 2019-04-18 DIAGNOSIS — M47817 Spondylosis without myelopathy or radiculopathy, lumbosacral region: Secondary | ICD-10-CM | POA: Diagnosis not present

## 2019-04-18 DIAGNOSIS — E119 Type 2 diabetes mellitus without complications: Secondary | ICD-10-CM | POA: Diagnosis not present

## 2019-04-18 DIAGNOSIS — I110 Hypertensive heart disease with heart failure: Secondary | ICD-10-CM | POA: Diagnosis not present

## 2019-04-18 DIAGNOSIS — I2510 Atherosclerotic heart disease of native coronary artery without angina pectoris: Secondary | ICD-10-CM | POA: Diagnosis not present

## 2019-04-18 DIAGNOSIS — M0579 Rheumatoid arthritis with rheumatoid factor of multiple sites without organ or systems involvement: Secondary | ICD-10-CM | POA: Diagnosis not present

## 2019-04-18 NOTE — Progress Notes (Deleted)
Office Visit Note  Patient: Stacy Meyers             Date of Birth: Jul 06, 1932           MRN: [id number]             PCP: Fayrene Helper, MD Referring: Fayrene Helper, MD Visit Date: 04/26/2019 Occupation: @GUAROCC @  Subjective:  No chief complaint on file.   History of Present Illness: Stacy Meyers is a 86 y.o. female ***   Activities of Daily Living:  Patient reports morning stiffness for *** {minute/hour:19697}.   Patient {ACTIONS;DENIES/REPORTS:21021675::"Denies"} nocturnal pain.  Difficulty dressing/grooming: {ACTIONS;DENIES/REPORTS:21021675::"Denies"} Difficulty climbing stairs: {ACTIONS;DENIES/REPORTS:21021675::"Denies"} Difficulty getting out of chair: {ACTIONS;DENIES/REPORTS:21021675::"Denies"} Difficulty using hands for taps, buttons, cutlery, and/or writing: {ACTIONS;DENIES/REPORTS:21021675::"Denies"}  No Rheumatology ROS completed.   PMFS History:  Patient Active Problem List   Diagnosis Date Noted  . E. coli bacteremia 04/06/2019  . Sepsis ([place]) 04/03/2019  . Sepsis secondary to UTI /Nephrolithiasis with obstructive uropathy 04/03/2019  . Right nephrolithiasis-1.4cm Moderate right-sided urinary tract obstruction/hydronephrosis 04/03/2019  . Rt Sided Acute unilateral obstructive uropathy- 1.4 cm Prox Ureteric Stone 04/03/2019  . H/o Chronic heart failure with preserved ejection fraction (HFpEF) ([place]) 04/03/2019  . Umbilical hernia without obstruction and without gangrene 12/10/2018  . Pelvic pain in female 12/10/2018  . Cough with exposure to COVID-19 virus 11/20/2018  . Type 2 diabetes mellitus with complication, without long-term current use of insulin ([place]) 11/20/2018  . Fibroids 05/08/2016  . Spondylosis of lumbar region without myelopathy or radiculopathy 12/24/2015  . Solitary bone cyst of right shoulder 05/28/2015  . Right knee pain 05/28/2015  . Osteoarthritis of both shoulders 05/28/2015  . Overweight (BMI 25.0-29.9) 04/28/2014  .  Ganglion cyst of wrist 05/11/2012  . Systolic murmur 123456  . Acute cystitis with hematuria 04/11/2011  . Diastolic dysfunction [id number]  . Seasonal allergies 08/28/2010  . Type 2 diabetes, diet controlled ([place]) 07/25/2009  . Herpes simplex virus (HSV) infection 09/16/2008  . Rheumatoid arthritis with rheumatoid factor of multiple sites without organ or systems involvement ([place]) 09/16/2008  . Hyperlipidemia LDL goal <100 05/23/2008  . Essential hypertension, benign 05/23/2008    Past Medical History:  Diagnosis Date  . Acute cholecystitis 12/16/2010  . Anemia 12/14/2010  . ARF (acute renal failure) ([place]) 12/14/2010  . Bradycardia    Sinus - noted 6/11  . Cholelithiasis 12/14/2010  . Collagen vascular disease ([place])   . Diastolic dysfunction [id number]   Grade 1 per 2-D echocardiogram  . Dysrhythmia   . Essential hypertension, benign   . HTN (hypertension) 12/14/2010  . Hypercalcemia 12/14/2010  . Hypokalemia 12/14/2010  . Mixed hyperlipidemia   . Osteoarthritis   . Rheumatoid arthritis(714.0)     Family History  Problem Relation Age of Onset  . Stroke Mother   . Cancer Father   . Diabetes Sister   . Diabetes Sister   . Kidney failure Brother   . Kidney failure Brother   . Pancreatic cancer Sister   . Kidney Stones Sister        Only one Kidney  . Arthritis Sister   . Heart disease Daughter    Past Surgical History:  Procedure Laterality Date  . Bilateral cataract surgery  2009  . BUNIONECTOMY Right 2012  . CHOLECYSTECTOMY  12/17/2010   Procedure: LAPAROSCOPIC CHOLECYSTECTOMY;  Surgeon: Donato Heinz;  Location: [facility];  Service: General;  Laterality: N/A;  . COLONOSCOPY  05/22/2011   Procedure: COLONOSCOPY;  Surgeon: Bernadene Person  Gloriann Loan, MD;  Location: [facility];  Service: Endoscopy;  Laterality: N/A;  1045  . CYSTOSCOPY WITH STENT PLACEMENT Right 04/03/2019   Procedure: CYSTOSCOPY, RIGHT RETROGRADE PYELOGRAM  WITH STENT PLACEMENT;  Surgeon: Robley Fries,  MD;  Location: [facility];  Service: Urology;  Laterality: Right;  . EYE SURGERY Bilateral    cataract  . GANGLION CYST EXCISION Left 06/02/2012   Procedure: Excision ganglion left dorsal wrist;  Surgeon: Sanjuana Kava, MD;  Location: [facility];  Service: Orthopedics;  Laterality: Left;  . L2-L3 discectomy  2002  . L3-L4, L4-L5 lumbar decompression  2011  . Right shoulder cyst excision  2010  . SPINE SURGERY     twice, approx 2006, 2011   Social History   Social History Narrative   Husband passed away 05/05/16. It has been financially harder to pay the bills since his passing, but she is making it. Has grandchildren around which makes it easier.    Immunization History  Administered Date(s) Administered  . Influenza Split 11/07/2013  . Influenza Whole 10/28/2009, 09/17/2010  . Influenza, High Dose Seasonal PF 09/29/2016  . Influenza,inj,Quad PF,6+ Mos 09/03/2014  . Influenza-Unspecified 10/05/2013, 09/29/2016  . PPD Test 05/14/2014, 05/28/2015  . Pneumococcal Conjugate-13 08/03/2013  . Pneumococcal Polysaccharide-23 08/28/2009  . Tdap 04/29/2010     Objective: Vital Signs: There were no vitals taken for this visit.   Physical Exam   Musculoskeletal Exam: ***  CDAI Exam: CDAI Score: -- Patient Global: --; Provider Global: -- Swollen: --; Tender: -- Joint Exam 04/26/2019   No joint exam has been documented for this visit   There is currently no information documented on the homunculus. Go to the Rheumatology activity and complete the homunculus joint exam.  Investigation: No additional findings.  Imaging: DG CHEST PORT 1 VIEW  Result Date: 04/06/2019 CLINICAL DATA:  Productive cough. EXAM: PORTABLE CHEST 1 VIEW COMPARISON:  04/03/2019 FINDINGS: Severe bilateral shoulder arthropathy with probable chronic rotator cuff tears. Atherosclerotic calcification of the aortic arch. Upper normal heart size with cardiac index of 57% on AP projection. Minimal blunting of the left  lateral costophrenic angle. Mild dextroconvex thoracic scoliosis. IMPRESSION: 1. Minimal blunting of the left lateral costophrenic angle, possibly from trace pleural effusion. 2. Severe bilateral shoulder arthropathy. 3.  Aortic Atherosclerosis (ICD10-I70.0). Electronically Signed   By: Van Clines M.D.   On: 04/06/2019 09:48   DG Chest Port 1 View  Result Date: 04/03/2019 CLINICAL DATA:  Nausea, vomiting and diarrhea. EXAM: PORTABLE CHEST 1 VIEW COMPARISON:  06/17/2015 FINDINGS: The cardiac silhouette, mediastinal and hilar contours are within normal limits and stable given the AP projection and portable technique. Stable mild tortuosity and calcification of the thoracic aorta. Low lung volumes with streaky basilar atelectasis. Mild eventration of the right hemidiaphragm. The bony thorax is intact. Stable advanced degenerative changes involving both shoulders. IMPRESSION: Low lung volumes with streaky bibasilar atelectasis. No infiltrates, edema or effusions. Electronically Signed   By: Marijo Sanes M.D.   On: 04/03/2019 07:09   DG C-Arm 1-60 Min-No Report  Result Date: 04/03/2019 Fluoroscopy was utilized by the requesting physician.  No radiographic interpretation.   CT Renal Stone Study  Result Date: 04/03/2019 CLINICAL DATA:  Abdominal pain and nausea for 1 day. EXAM: CT ABDOMEN AND PELVIS WITHOUT CONTRAST TECHNIQUE: Multidetector CT imaging of the abdomen and pelvis was performed following the standard protocol without IV contrast. COMPARISON:  05/25/2016 FINDINGS: Lower chest: Dependent bibasilar atelectasis. Left lower lobe calcified granuloma.  Tiny bilateral pleural effusions. Mild cardiomegaly. Multivessel coronary artery atherosclerosis. Hepatobiliary: High left hepatic lobe cyst. Cholecystectomy, without biliary ductal dilatation. Mild motion degradation in the lower chest and abdomen. Pancreas: Grossly normal pancreas for age. Spleen: Normal in size, without focal abnormality.  Adrenals/Urinary Tract: Normal adrenal glands. Small bilateral renal collecting system calculi. Interpolar left renal 2.4 cm cyst. Moderate right-sided hydroureteronephrosis to the level of a proximal right ureteric 1.0 x 1.4 cm stone, including on coronal image 46 transverse image 46. Perinephric edema and fluid, suggesting forniceal rupture. No bladder calculi. Stomach/Bowel: Tiny hiatal hernia. Extensive colonic diverticulosis. Lipoma within the ascending colon at 2.8 cm on 41/2. Normal terminal ileum and appendix. Normal small bowel. Vascular/Lymphatic: Aortic atherosclerosis. No abdominopelvic adenopathy. Reproductive: Fibroid uterus.  No adnexal mass. Other: No significant free fluid. Fat containing ventral abdominal wall hernia. Musculoskeletal: Advanced lumbosacral spondylosis. Grade 1-2 L4-5 anterolisthesis. IMPRESSION: 1. Moderate right-sided urinary tract obstruction secondary to a proximal right ureteric 1.4 cm calculus. 2. Bilateral nephrolithiasis. 3. Tiny bilateral pleural effusions. 4. Fibroid uterus. 5. Coronary artery atherosclerosis. Aortic Atherosclerosis (ICD10-I70.0). 6. Ascending colonic lipoma, without obstruction. Electronically Signed   By: Abigail Miyamoto M.D.   On: 04/03/2019 09:15    Recent Labs: Lab Results  Component Value Date   WBC 11.8 (H) 04/06/2019   HGB 10.8 (L) 04/06/2019   PLT 135 (L) 04/06/2019   NA 141 04/06/2019   K 3.9 04/06/2019   CL 115 (H) 04/06/2019   CO2 21 (L) 04/06/2019   GLUCOSE 113 (H) 04/06/2019   BUN 27 (H) 04/06/2019   CREATININE 0.90 04/06/2019   BILITOT 0.8 04/03/2019   ALKPHOS 83 04/03/2019   AST 21 04/03/2019   ALT 13 04/03/2019   PROT 5.1 (L) 04/03/2019   ALBUMIN 2.6 (L) 04/03/2019   CALCIUM 9.2 04/06/2019   GFRAA >60 04/06/2019    Speciality Comments: No specialty comments available.  Procedures:  No procedures performed Allergies: Aspirin and Sudafed [pseudoephedrine hcl]   Assessment / Plan:     Visit Diagnoses: No diagnosis  found.  Orders: No orders of the defined types were placed in this encounter.  No orders of the defined types were placed in this encounter.   Face-to-face time spent with patient was *** minutes. Greater than 50% of time was spent in counseling and coordination of care.  Follow-Up Instructions: No follow-ups on file.   Ofilia Neas, PA-C  Note - This record has been created using Dragon software.  Chart creation errors have been sought, but may not always  have been located. Such creation errors do not reflect on  the standard of medical care.

## 2019-04-18 NOTE — Telephone Encounter (Signed)
Urine results was given to the patient  And the patient's grand- daughter

## 2019-04-18 NOTE — Telephone Encounter (Signed)
Patient granddaughter called back in regards to this. Informed her you would be calling

## 2019-04-18 NOTE — Telephone Encounter (Signed)
-----   Message from Maryruth Hancock, MD sent at 04/17/2019 11:21 AM EDT ----- You still had a small amount of blood in your urine. Drink plenty of water and we will recheck your specimen.  Your culture was negative-no bacterial grown. Lets check again in 2 weeks,

## 2019-04-18 NOTE — Telephone Encounter (Signed)
Spoke with patient's granddaughter and she stated patient was seen by [facility] on 04/17/19 they did notice patient  had a kidney stone and they did recheck her urine and they will be scheduling a procedure soon to remove the kidney stone. Once the appt for the procedure has been scheduled she will call and let us know.

## 2019-04-19 DIAGNOSIS — M47817 Spondylosis without myelopathy or radiculopathy, lumbosacral region: Secondary | ICD-10-CM | POA: Diagnosis not present

## 2019-04-19 DIAGNOSIS — N136 Pyonephrosis: Secondary | ICD-10-CM | POA: Diagnosis not present

## 2019-04-19 DIAGNOSIS — A4151 Sepsis due to Escherichia coli [E. coli]: Secondary | ICD-10-CM | POA: Diagnosis not present

## 2019-04-19 DIAGNOSIS — M4316 Spondylolisthesis, lumbar region: Secondary | ICD-10-CM | POA: Diagnosis not present

## 2019-04-19 DIAGNOSIS — I5032 Chronic diastolic (congestive) heart failure: Secondary | ICD-10-CM | POA: Diagnosis not present

## 2019-04-19 DIAGNOSIS — M0579 Rheumatoid arthritis with rheumatoid factor of multiple sites without organ or systems involvement: Secondary | ICD-10-CM | POA: Diagnosis not present

## 2019-04-19 DIAGNOSIS — I110 Hypertensive heart disease with heart failure: Secondary | ICD-10-CM | POA: Diagnosis not present

## 2019-04-19 DIAGNOSIS — E119 Type 2 diabetes mellitus without complications: Secondary | ICD-10-CM | POA: Diagnosis not present

## 2019-04-19 DIAGNOSIS — I2510 Atherosclerotic heart disease of native coronary artery without angina pectoris: Secondary | ICD-10-CM | POA: Diagnosis not present

## 2019-04-19 DIAGNOSIS — M1711 Unilateral primary osteoarthritis, right knee: Secondary | ICD-10-CM | POA: Diagnosis not present

## 2019-04-20 DIAGNOSIS — N136 Pyonephrosis: Secondary | ICD-10-CM | POA: Diagnosis not present

## 2019-04-20 DIAGNOSIS — M1711 Unilateral primary osteoarthritis, right knee: Secondary | ICD-10-CM | POA: Diagnosis not present

## 2019-04-20 DIAGNOSIS — E119 Type 2 diabetes mellitus without complications: Secondary | ICD-10-CM | POA: Diagnosis not present

## 2019-04-20 DIAGNOSIS — I5032 Chronic diastolic (congestive) heart failure: Secondary | ICD-10-CM | POA: Diagnosis not present

## 2019-04-20 DIAGNOSIS — M0579 Rheumatoid arthritis with rheumatoid factor of multiple sites without organ or systems involvement: Secondary | ICD-10-CM | POA: Diagnosis not present

## 2019-04-20 DIAGNOSIS — M47817 Spondylosis without myelopathy or radiculopathy, lumbosacral region: Secondary | ICD-10-CM | POA: Diagnosis not present

## 2019-04-20 DIAGNOSIS — M4316 Spondylolisthesis, lumbar region: Secondary | ICD-10-CM | POA: Diagnosis not present

## 2019-04-20 DIAGNOSIS — A4151 Sepsis due to Escherichia coli [E. coli]: Secondary | ICD-10-CM | POA: Diagnosis not present

## 2019-04-20 DIAGNOSIS — I2510 Atherosclerotic heart disease of native coronary artery without angina pectoris: Secondary | ICD-10-CM | POA: Diagnosis not present

## 2019-04-20 DIAGNOSIS — I110 Hypertensive heart disease with heart failure: Secondary | ICD-10-CM | POA: Diagnosis not present

## 2019-04-21 ENCOUNTER — Other Ambulatory Visit: Payer: Self-pay | Admitting: Urology

## 2019-04-24 DIAGNOSIS — M0579 Rheumatoid arthritis with rheumatoid factor of multiple sites without organ or systems involvement: Secondary | ICD-10-CM | POA: Diagnosis not present

## 2019-04-24 DIAGNOSIS — I5032 Chronic diastolic (congestive) heart failure: Secondary | ICD-10-CM | POA: Diagnosis not present

## 2019-04-24 DIAGNOSIS — M47817 Spondylosis without myelopathy or radiculopathy, lumbosacral region: Secondary | ICD-10-CM | POA: Diagnosis not present

## 2019-04-24 DIAGNOSIS — A4151 Sepsis due to Escherichia coli [E. coli]: Secondary | ICD-10-CM | POA: Diagnosis not present

## 2019-04-24 DIAGNOSIS — M1711 Unilateral primary osteoarthritis, right knee: Secondary | ICD-10-CM | POA: Diagnosis not present

## 2019-04-24 DIAGNOSIS — I110 Hypertensive heart disease with heart failure: Secondary | ICD-10-CM | POA: Diagnosis not present

## 2019-04-24 DIAGNOSIS — E119 Type 2 diabetes mellitus without complications: Secondary | ICD-10-CM | POA: Diagnosis not present

## 2019-04-24 DIAGNOSIS — N136 Pyonephrosis: Secondary | ICD-10-CM | POA: Diagnosis not present

## 2019-04-24 DIAGNOSIS — M4316 Spondylolisthesis, lumbar region: Secondary | ICD-10-CM | POA: Diagnosis not present

## 2019-04-24 DIAGNOSIS — I2510 Atherosclerotic heart disease of native coronary artery without angina pectoris: Secondary | ICD-10-CM | POA: Diagnosis not present

## 2019-04-25 DIAGNOSIS — A4151 Sepsis due to Escherichia coli [E. coli]: Secondary | ICD-10-CM | POA: Diagnosis not present

## 2019-04-25 DIAGNOSIS — I2510 Atherosclerotic heart disease of native coronary artery without angina pectoris: Secondary | ICD-10-CM | POA: Diagnosis not present

## 2019-04-25 DIAGNOSIS — E119 Type 2 diabetes mellitus without complications: Secondary | ICD-10-CM | POA: Diagnosis not present

## 2019-04-25 DIAGNOSIS — M1711 Unilateral primary osteoarthritis, right knee: Secondary | ICD-10-CM | POA: Diagnosis not present

## 2019-04-25 DIAGNOSIS — I110 Hypertensive heart disease with heart failure: Secondary | ICD-10-CM | POA: Diagnosis not present

## 2019-04-25 DIAGNOSIS — M0579 Rheumatoid arthritis with rheumatoid factor of multiple sites without organ or systems involvement: Secondary | ICD-10-CM | POA: Diagnosis not present

## 2019-04-25 DIAGNOSIS — N136 Pyonephrosis: Secondary | ICD-10-CM | POA: Diagnosis not present

## 2019-04-25 DIAGNOSIS — M4316 Spondylolisthesis, lumbar region: Secondary | ICD-10-CM | POA: Diagnosis not present

## 2019-04-25 DIAGNOSIS — I5032 Chronic diastolic (congestive) heart failure: Secondary | ICD-10-CM | POA: Diagnosis not present

## 2019-04-25 DIAGNOSIS — M47817 Spondylosis without myelopathy or radiculopathy, lumbosacral region: Secondary | ICD-10-CM | POA: Diagnosis not present

## 2019-04-26 ENCOUNTER — Ambulatory Visit: Payer: Medicare Other | Admitting: Rheumatology

## 2019-04-26 NOTE — Progress Notes (Signed)
Office Visit Note  Patient: Stacy Meyers             Date of Birth: 1932/07/11           MRN: [id number]             PCP: Fayrene Helper, MD Referring: Fayrene Helper, MD Visit Date: 05/02/2019 Occupation: @GUAROCC @  Subjective:  Medication monitoring   History of Present Illness: Stacy Meyers is a 86 y.o. female with history of seropositive rheumatoid arthritis, osteoarthritis, and osteoporosis.  Patient is taking methotrexate 3 tablets by mouth once weekly, folic acid 1 mg by mouth daily, and prednisone 4 mg daily.  She denies any recent rheumatoid arthritis flares.  She denies any joint pain or joint swelling at this time.  She denies any morning stiffness.  She denies any new concerns.  She continues to take Fosamax 70 mg 1 tablet by mouth once weekly for management of osteoporosis.  Patient reports that she has had both COVID-19 vaccinations.    Activities of Daily Living:  Patient reports morning stiffness for 0 minutes.   Patient Denies nocturnal pain.  Difficulty dressing/grooming: Denies Difficulty climbing stairs: Denies Difficulty getting out of chair: Denies Difficulty using hands for taps, buttons, cutlery, and/or writing: Reports  Review of Systems  Constitutional: Negative for fatigue.  HENT: Negative for mouth sores, mouth dryness and nose dryness.   Eyes: Negative for pain, itching, visual disturbance and dryness.  Respiratory: Negative for cough, hemoptysis, shortness of breath and difficulty breathing.   Cardiovascular: Negative for chest pain, palpitations, hypertension and swelling in legs/feet.  Gastrointestinal: Negative for constipation and diarrhea.  Endocrine: Negative for increased urination.  Genitourinary: Positive for nocturia. Negative for difficulty urinating and painful urination.  Musculoskeletal: Positive for arthralgias, joint pain and joint swelling. Negative for myalgias, muscle weakness, morning stiffness, muscle tenderness and  myalgias.  Skin: Negative for color change, pallor, rash, hair loss, nodules/bumps, redness, skin tightness, ulcers and sensitivity to sunlight.  Allergic/Immunologic: Negative for susceptible to infections.  Neurological: Negative for dizziness, light-headedness, numbness, headaches, memory loss and weakness.  Hematological: Positive for bruising/bleeding tendency. Negative for swollen glands.  Psychiatric/Behavioral: Negative for depressed mood, confusion and sleep disturbance. The patient is not nervous/anxious.     PMFS History:  Patient Active Problem List   Diagnosis Date Noted  . E. coli bacteremia 04/06/2019  . Sepsis ([place]) 04/03/2019  . Sepsis secondary to UTI /Nephrolithiasis with obstructive uropathy 04/03/2019  . Right nephrolithiasis-1.4cm Moderate right-sided urinary tract obstruction/hydronephrosis 04/03/2019  . Rt Sided Acute unilateral obstructive uropathy- 1.4 cm Prox Ureteric Stone 04/03/2019  . H/o Chronic heart failure with preserved ejection fraction (HFpEF) ([place]) 04/03/2019  . Umbilical hernia without obstruction and without gangrene 12/10/2018  . Pelvic pain in female 12/10/2018  . Cough with exposure to COVID-19 virus 11/20/2018  . Type 2 diabetes mellitus with complication, without long-term current use of insulin ([place]) 11/20/2018  . Fibroids 05/08/2016  . Spondylosis of lumbar region without myelopathy or radiculopathy 12/24/2015  . Solitary bone cyst of right shoulder 05/28/2015  . Right knee pain 05/28/2015  . Osteoarthritis of both shoulders 05/28/2015  . Overweight (BMI 25.0-29.9) 04/28/2014  . Ganglion cyst of wrist 05/11/2012  . Systolic murmur 123456  . Acute cystitis with hematuria 04/11/2011  . Diastolic dysfunction [id number]  . Seasonal allergies 08/28/2010  . Type 2 diabetes, diet controlled ([place]) 07/25/2009  . Herpes simplex virus (HSV) infection 09/16/2008  . Rheumatoid arthritis with rheumatoid factor of  multiple sites without organ or  systems involvement ([place]) 09/16/2008  . Hyperlipidemia LDL goal <100 05/23/2008  . Essential hypertension, benign 05/23/2008    Past Medical History:  Diagnosis Date  . Acute cholecystitis 12/16/2010  . Anemia 12/14/2010  . ARF (acute renal failure) ([place]) 12/14/2010  . Bradycardia    Sinus - noted 6/11  . Cholelithiasis 12/14/2010  . Collagen vascular disease ([place])   . Diastolic dysfunction [id number]   Grade 1 per 2-D echocardiogram  . Dysrhythmia   . Essential hypertension, benign   . HTN (hypertension) 12/14/2010  . Hypercalcemia 12/14/2010  . Hypokalemia 12/14/2010  . Mixed hyperlipidemia   . Osteoarthritis   . Rheumatoid arthritis(714.0)     Family History  Problem Relation Age of Onset  . Stroke Mother   . Cancer Father   . Diabetes Sister   . Diabetes Sister   . Kidney failure Brother   . Kidney failure Brother   . Pancreatic cancer Sister   . Kidney Stones Sister        Only one Kidney  . Arthritis Sister   . Heart disease Daughter    Past Surgical History:  Procedure Laterality Date  . Bilateral cataract surgery  2009  . BUNIONECTOMY Right 2012  . CHOLECYSTECTOMY  12/17/2010   Procedure: LAPAROSCOPIC CHOLECYSTECTOMY;  Surgeon: Donato Heinz;  Location: [facility];  Service: General;  Laterality: N/A;  . COLONOSCOPY  05/22/2011   Procedure: COLONOSCOPY;  Surgeon: Rogene Houston, MD;  Location: [facility];  Service: Endoscopy;  Laterality: N/A;  1045  . CYSTOSCOPY WITH STENT PLACEMENT Right 04/03/2019   Procedure: CYSTOSCOPY, RIGHT RETROGRADE PYELOGRAM  WITH STENT PLACEMENT;  Surgeon: Robley Fries, MD;  Location: [facility];  Service: Urology;  Laterality: Right;  . EYE SURGERY Bilateral    cataract  . GANGLION CYST EXCISION Left 06/02/2012   Procedure: Excision ganglion left dorsal wrist;  Surgeon: Sanjuana Kava, MD;  Location: [facility];  Service: Orthopedics;  Laterality: Left;  . L2-L3 discectomy  2002  . L3-L4, L4-L5 lumbar decompression  2011  . Right  shoulder cyst excision  2010  . SPINE SURGERY     twice, approx 2006, 2011   Social History   Social History Narrative   Husband passed away 2016-04-13. It has been financially harder to pay the bills since his passing, but she is making it. Has grandchildren around which makes it easier.    Immunization History  Administered Date(s) Administered  . Influenza Split 11/07/2013  . Influenza Whole 10/28/2009, 09/17/2010  . Influenza, High Dose Seasonal PF 09/29/2016  . Influenza,inj,Quad PF,6+ Mos 09/03/2014  . Influenza-Unspecified 10/05/2013, 09/29/2016  . PPD Test 05/14/2014, 05/28/2015  . Pneumococcal Conjugate-13 08/03/2013  . Pneumococcal Polysaccharide-23 08/28/2009  . Tdap 04/29/2010     Objective: Vital Signs: BP 135/73 (BP Location: Left Arm, Patient Position: Sitting, Cuff Size: Normal)   Pulse (!) 59   Resp 13   Ht 4\' 11"  (1.499 m)   Wt 131 lb (59.4 kg)   BMI 26.46 kg/m    Physical Exam Vitals and nursing note reviewed.  Constitutional:      Appearance: She is well-developed.  HENT:     Head: Normocephalic and atraumatic.  Eyes:     Conjunctiva/sclera: Conjunctivae normal.  Pulmonary:     Effort: Pulmonary effort is normal.  Abdominal:     General: Bowel sounds are normal.     Palpations: Abdomen is soft.  Musculoskeletal:  Cervical back: Normal range of motion.  Lymphadenopathy:     Cervical: No cervical adenopathy.  Skin:    General: Skin is warm and dry.     Capillary Refill: Capillary refill takes less than 2 seconds.  Neurological:     Mental Status: She is alert and oriented to person, place, and time.  Psychiatric:        Behavior: Behavior normal.      Musculoskeletal Exam: C-spine slightly limited range of motion with lateral rotation.  She has postural thoracic kyphosis.  Shoulder joints limited abduction to about 30 degrees bilaterally.  Warmth of both shoulders noted.  Elbow joints have good range of motion with no tenderness or  inflammation.  She has very limited range of motion of both wrist joints.  She has extensor tenosynovitis of the left wrist on exam but no tenderness was noted.  MCPs, PIPs, DIPs have good range of motion with no synovitis.  Ulnar deviation noted bilaterally.  She has synovial thickening of MCP joints.  Synovial thickening over the ulnar styloid bilaterally.  Left knee has good range of motion with no discomfort.  Right knee has warmth and inflammation and slightly limited extension on exam.  Valgus deformity of the right knee noted.  No tenderness or swelling of her ankle joints.  CDAI Exam: CDAI Score: 4.4  Patient Global: 2 mm; Provider Global: 2 mm Swollen: 4 ; Tender: 0  Joint Exam 05/02/2019      Right  Left  Glenohumeral  Swollen   Swollen   Wrist     Swollen   Knee  Swollen         Investigation: No additional findings.  Imaging: DG CHEST PORT 1 VIEW  Result Date: 04/06/2019 CLINICAL DATA:  Productive cough. EXAM: PORTABLE CHEST 1 VIEW COMPARISON:  04/03/2019 FINDINGS: Severe bilateral shoulder arthropathy with probable chronic rotator cuff tears. Atherosclerotic calcification of the aortic arch. Upper normal heart size with cardiac index of 57% on AP projection. Minimal blunting of the left lateral costophrenic angle. Mild dextroconvex thoracic scoliosis. IMPRESSION: 1. Minimal blunting of the left lateral costophrenic angle, possibly from trace pleural effusion. 2. Severe bilateral shoulder arthropathy. 3.  Aortic Atherosclerosis (ICD10-I70.0). Electronically Signed   By: Van Clines M.D.   On: 04/06/2019 09:48   DG Chest Port 1 View  Result Date: 04/03/2019 CLINICAL DATA:  Nausea, vomiting and diarrhea. EXAM: PORTABLE CHEST 1 VIEW COMPARISON:  06/17/2015 FINDINGS: The cardiac silhouette, mediastinal and hilar contours are within normal limits and stable given the AP projection and portable technique. Stable mild tortuosity and calcification of the thoracic aorta. Low lung  volumes with streaky basilar atelectasis. Mild eventration of the right hemidiaphragm. The bony thorax is intact. Stable advanced degenerative changes involving both shoulders. IMPRESSION: Low lung volumes with streaky bibasilar atelectasis. No infiltrates, edema or effusions. Electronically Signed   By: Marijo Sanes M.D.   On: 04/03/2019 07:09   DG C-Arm 1-60 Min-No Report  Result Date: 04/03/2019 Fluoroscopy was utilized by the requesting physician.  No radiographic interpretation.   CT Renal Stone Study  Result Date: 04/03/2019 CLINICAL DATA:  Abdominal pain and nausea for 1 day. EXAM: CT ABDOMEN AND PELVIS WITHOUT CONTRAST TECHNIQUE: Multidetector CT imaging of the abdomen and pelvis was performed following the standard protocol without IV contrast. COMPARISON:  05/25/2016 FINDINGS: Lower chest: Dependent bibasilar atelectasis. Left lower lobe calcified granuloma. Tiny bilateral pleural effusions. Mild cardiomegaly. Multivessel coronary artery atherosclerosis. Hepatobiliary: High left hepatic lobe cyst. Cholecystectomy, without  biliary ductal dilatation. Mild motion degradation in the lower chest and abdomen. Pancreas: Grossly normal pancreas for age. Spleen: Normal in size, without focal abnormality. Adrenals/Urinary Tract: Normal adrenal glands. Small bilateral renal collecting system calculi. Interpolar left renal 2.4 cm cyst. Moderate right-sided hydroureteronephrosis to the level of a proximal right ureteric 1.0 x 1.4 cm stone, including on coronal image 46 transverse image 46. Perinephric edema and fluid, suggesting forniceal rupture. No bladder calculi. Stomach/Bowel: Tiny hiatal hernia. Extensive colonic diverticulosis. Lipoma within the ascending colon at 2.8 cm on 41/2. Normal terminal ileum and appendix. Normal small bowel. Vascular/Lymphatic: Aortic atherosclerosis. No abdominopelvic adenopathy. Reproductive: Fibroid uterus.  No adnexal mass. Other: No significant free fluid. Fat containing  ventral abdominal wall hernia. Musculoskeletal: Advanced lumbosacral spondylosis. Grade 1-2 L4-5 anterolisthesis. IMPRESSION: 1. Moderate right-sided urinary tract obstruction secondary to a proximal right ureteric 1.4 cm calculus. 2. Bilateral nephrolithiasis. 3. Tiny bilateral pleural effusions. 4. Fibroid uterus. 5. Coronary artery atherosclerosis. Aortic Atherosclerosis (ICD10-I70.0). 6. Ascending colonic lipoma, without obstruction. Electronically Signed   By: Abigail Miyamoto M.D.   On: 04/03/2019 09:15    Recent Labs: Lab Results  Component Value Date   WBC 11.8 (H) 04/06/2019   HGB 10.8 (L) 04/06/2019   PLT 135 (L) 04/06/2019   NA 141 04/06/2019   K 3.9 04/06/2019   CL 115 (H) 04/06/2019   CO2 21 (L) 04/06/2019   GLUCOSE 113 (H) 04/06/2019   BUN 27 (H) 04/06/2019   CREATININE 0.90 04/06/2019   BILITOT 0.8 04/03/2019   ALKPHOS 83 04/03/2019   AST 21 04/03/2019   ALT 13 04/03/2019   PROT 5.1 (L) 04/03/2019   ALBUMIN 2.6 (L) 04/03/2019   CALCIUM 9.2 04/06/2019   GFRAA >60 04/06/2019    Speciality Comments: No specialty comments available.  Procedures:  No procedures performed Allergies: Aspirin and Sudafed [pseudoephedrine hcl]   Assessment / Plan:     Visit Diagnoses: Rheumatoid arthritis with rheumatoid factor of multiple sites without organ or systems involvement ([place]): She has warmth of both shoulder joints on exam, extensor tenosynovitis of the left wrist, and warmth of the right knee.  She is taking methotrexate 3 tablets by mouth once weekly, folic acid 1 mg by mouth daily, and prednisone 1 mg 4 tablets daily.  She has not missed any doses of methotrexate or prednisone recently.  According to the patient she is not having any joint pain and has not had any joint swelling recently.  She denies any morning stiffness or nocturnal pain.  She will continue taking methotrexate, folic acid, prednisone as prescribed.  A refill folic acid was sent to the pharmacy today.  Patient has  declined changing to more aggressive therapy at each visit.  She was advised to notify us if she develops increased joint pain or joint swelling.  She will follow-up in the office in 5 months.  High risk medication use - Methotrexate 3 tablets every 7 days, folic acid 1 mg daily, and prednisone 1 mg 4 tablets daily.  CBC and CMP are drawn on 04/03/2019.  Primary osteoarthritis of both hands: She has PIP and DIP thickening consistent with osteoarthritis of both hands.  No tenderness or inflammation was noted.  Joint protection and muscle strengthening were discussed.  Primary osteoarthritis of both shoulders: Chronic pain.  She has limited abduction to about 30 degrees bilaterally.  Warmth of both shoulder joints were noted on exam.  Primary osteoarthritis of both knees: She has slightly limited extension and valgus deformity of  the right knee joint on exam.  Warmth of the right knee was noted.  Left knee has good range of motion with no warmth or effusion.  Primary osteoarthritis of both feet: She is not having any discomfort in her feet at this time.  She was proper fitting shoes.  Spondylosis of lumbar region without myelopathy or radiculopathy: She is not having any lower back pain at this time.  No symptoms of radiculopathy.  Age-related osteoporosis without current pathological fracture: She continues to take Fosamax 70 mg 1 tablet by mouth once weekly.  Other medical conditions are listed as follows:  Systolic murmur  Essential hypertension  Type 2 diabetes, diet controlled ([place])  Orders: No orders of the defined types were placed in this encounter.  Meds ordered this encounter  Medications  . folic acid (FOLVITE) 1 MG tablet    Sig: Take 1 tablet (1 mg total) by mouth daily.    Dispense:  180 tablet    Refill:  1    Follow-Up Instructions: Return in about 5 months (around 10/02/2019) for Rheumatoid arthritis, Osteoarthritis, Osteoporosis.   Hazel Sams, PA-C  I examined  and evaluated the patient with Hazel Sams PA.  Patient has synovial thickening in multiple joints and also some left extensor tenosynovitis on my exam.  She does not want to change her treatment.  The plan of care was discussed as noted above.  Bo Merino, MD  Note - This record has been created using Editor, commissioning.  Chart creation errors have been sought, but may not always  have been located. Such creation errors do not reflect on  the standard of medical care.

## 2019-04-27 DIAGNOSIS — Z9181 History of falling: Secondary | ICD-10-CM

## 2019-04-27 DIAGNOSIS — I959 Hypotension, unspecified: Secondary | ICD-10-CM

## 2019-04-27 DIAGNOSIS — K8020 Calculus of gallbladder without cholecystitis without obstruction: Secondary | ICD-10-CM

## 2019-04-27 DIAGNOSIS — D175 Benign lipomatous neoplasm of intra-abdominal organs: Secondary | ICD-10-CM

## 2019-04-27 DIAGNOSIS — K5730 Diverticulosis of large intestine without perforation or abscess without bleeding: Secondary | ICD-10-CM

## 2019-04-27 DIAGNOSIS — Z79899 Other long term (current) drug therapy: Secondary | ICD-10-CM

## 2019-04-27 DIAGNOSIS — K449 Diaphragmatic hernia without obstruction or gangrene: Secondary | ICD-10-CM

## 2019-04-27 DIAGNOSIS — M4316 Spondylolisthesis, lumbar region: Secondary | ICD-10-CM

## 2019-04-27 DIAGNOSIS — M0579 Rheumatoid arthritis with rheumatoid factor of multiple sites without organ or systems involvement: Secondary | ICD-10-CM

## 2019-04-27 DIAGNOSIS — N136 Pyonephrosis: Secondary | ICD-10-CM | POA: Diagnosis not present

## 2019-04-27 DIAGNOSIS — I499 Cardiac arrhythmia, unspecified: Secondary | ICD-10-CM

## 2019-04-27 DIAGNOSIS — A4151 Sepsis due to Escherichia coli [E. coli]: Secondary | ICD-10-CM | POA: Diagnosis not present

## 2019-04-27 DIAGNOSIS — M19012 Primary osteoarthritis, left shoulder: Secondary | ICD-10-CM

## 2019-04-27 DIAGNOSIS — D630 Anemia in neoplastic disease: Secondary | ICD-10-CM

## 2019-04-27 DIAGNOSIS — M810 Age-related osteoporosis without current pathological fracture: Secondary | ICD-10-CM

## 2019-04-27 DIAGNOSIS — I700 Atherosclerosis of aorta: Secondary | ICD-10-CM

## 2019-04-27 DIAGNOSIS — Z7952 Long term (current) use of systemic steroids: Secondary | ICD-10-CM

## 2019-04-27 DIAGNOSIS — K7689 Other specified diseases of liver: Secondary | ICD-10-CM

## 2019-04-27 DIAGNOSIS — E119 Type 2 diabetes mellitus without complications: Secondary | ICD-10-CM

## 2019-04-27 DIAGNOSIS — I2510 Atherosclerotic heart disease of native coronary artery without angina pectoris: Secondary | ICD-10-CM | POA: Diagnosis not present

## 2019-04-27 DIAGNOSIS — J9811 Atelectasis: Secondary | ICD-10-CM

## 2019-04-27 DIAGNOSIS — M1711 Unilateral primary osteoarthritis, right knee: Secondary | ICD-10-CM

## 2019-04-27 DIAGNOSIS — I110 Hypertensive heart disease with heart failure: Secondary | ICD-10-CM | POA: Diagnosis not present

## 2019-04-27 DIAGNOSIS — I5032 Chronic diastolic (congestive) heart failure: Secondary | ICD-10-CM

## 2019-04-27 DIAGNOSIS — M359 Systemic involvement of connective tissue, unspecified: Secondary | ICD-10-CM

## 2019-04-27 DIAGNOSIS — E782 Mixed hyperlipidemia: Secondary | ICD-10-CM

## 2019-04-27 DIAGNOSIS — N179 Acute kidney failure, unspecified: Secondary | ICD-10-CM

## 2019-04-27 DIAGNOSIS — M19011 Primary osteoarthritis, right shoulder: Secondary | ICD-10-CM

## 2019-04-27 DIAGNOSIS — M47817 Spondylosis without myelopathy or radiculopathy, lumbosacral region: Secondary | ICD-10-CM

## 2019-04-27 DIAGNOSIS — E860 Dehydration: Secondary | ICD-10-CM

## 2019-05-01 ENCOUNTER — Telehealth: Payer: Self-pay | Admitting: Family Medicine

## 2019-05-01 DIAGNOSIS — M4316 Spondylolisthesis, lumbar region: Secondary | ICD-10-CM | POA: Diagnosis not present

## 2019-05-01 DIAGNOSIS — E119 Type 2 diabetes mellitus without complications: Secondary | ICD-10-CM | POA: Diagnosis not present

## 2019-05-01 DIAGNOSIS — M1711 Unilateral primary osteoarthritis, right knee: Secondary | ICD-10-CM | POA: Diagnosis not present

## 2019-05-01 DIAGNOSIS — A4151 Sepsis due to Escherichia coli [E. coli]: Secondary | ICD-10-CM | POA: Diagnosis not present

## 2019-05-01 DIAGNOSIS — N136 Pyonephrosis: Secondary | ICD-10-CM | POA: Diagnosis not present

## 2019-05-01 DIAGNOSIS — M0579 Rheumatoid arthritis with rheumatoid factor of multiple sites without organ or systems involvement: Secondary | ICD-10-CM | POA: Diagnosis not present

## 2019-05-01 DIAGNOSIS — I110 Hypertensive heart disease with heart failure: Secondary | ICD-10-CM | POA: Diagnosis not present

## 2019-05-01 DIAGNOSIS — M47817 Spondylosis without myelopathy or radiculopathy, lumbosacral region: Secondary | ICD-10-CM | POA: Diagnosis not present

## 2019-05-01 DIAGNOSIS — I5032 Chronic diastolic (congestive) heart failure: Secondary | ICD-10-CM | POA: Diagnosis not present

## 2019-05-01 DIAGNOSIS — I2510 Atherosclerotic heart disease of native coronary artery without angina pectoris: Secondary | ICD-10-CM | POA: Diagnosis not present

## 2019-05-01 NOTE — Telephone Encounter (Signed)
Please notify pt that we did not receive any paperwork for surgical clearance and pt will need an office visit to complete an examination for surgery.  Contact urology to ask for paperwork if patient would like [place] to complete along with an office visit

## 2019-05-01 NOTE — Telephone Encounter (Signed)
[facility] is calling in regards to a surgical clearance form.  We need this patient for a hospital follow up on 04/12/19 and she states they sent a medical clearance on 04/21/19. Patient is scheduled for surgery on 05/09/19 and she is wanting to know if we received this and if it has been filled out.  [phone] ext [phone]

## 2019-05-01 NOTE — Telephone Encounter (Signed)
Please advise 

## 2019-05-01 NOTE — Telephone Encounter (Signed)
Below msg was left on patient machine

## 2019-05-02 ENCOUNTER — Ambulatory Visit: Payer: Medicare HMO | Admitting: Rheumatology

## 2019-05-02 ENCOUNTER — Other Ambulatory Visit: Payer: Self-pay

## 2019-05-02 ENCOUNTER — Encounter: Payer: Self-pay | Admitting: Rheumatology

## 2019-05-02 VITALS — BP 135/73 | HR 59 | Resp 13 | Ht 59.0 in | Wt 131.0 lb

## 2019-05-02 DIAGNOSIS — M0579 Rheumatoid arthritis with rheumatoid factor of multiple sites without organ or systems involvement: Secondary | ICD-10-CM

## 2019-05-02 DIAGNOSIS — I10 Essential (primary) hypertension: Secondary | ICD-10-CM | POA: Diagnosis not present

## 2019-05-02 DIAGNOSIS — M19071 Primary osteoarthritis, right ankle and foot: Secondary | ICD-10-CM | POA: Diagnosis not present

## 2019-05-02 DIAGNOSIS — R011 Cardiac murmur, unspecified: Secondary | ICD-10-CM

## 2019-05-02 DIAGNOSIS — A4151 Sepsis due to Escherichia coli [E. coli]: Secondary | ICD-10-CM | POA: Diagnosis not present

## 2019-05-02 DIAGNOSIS — M4316 Spondylolisthesis, lumbar region: Secondary | ICD-10-CM | POA: Diagnosis not present

## 2019-05-02 DIAGNOSIS — M810 Age-related osteoporosis without current pathological fracture: Secondary | ICD-10-CM

## 2019-05-02 DIAGNOSIS — M47816 Spondylosis without myelopathy or radiculopathy, lumbar region: Secondary | ICD-10-CM

## 2019-05-02 DIAGNOSIS — E119 Type 2 diabetes mellitus without complications: Secondary | ICD-10-CM

## 2019-05-02 DIAGNOSIS — M1711 Unilateral primary osteoarthritis, right knee: Secondary | ICD-10-CM | POA: Diagnosis not present

## 2019-05-02 DIAGNOSIS — M19042 Primary osteoarthritis, left hand: Secondary | ICD-10-CM

## 2019-05-02 DIAGNOSIS — M170 Bilateral primary osteoarthritis of knee: Secondary | ICD-10-CM | POA: Diagnosis not present

## 2019-05-02 DIAGNOSIS — I110 Hypertensive heart disease with heart failure: Secondary | ICD-10-CM | POA: Diagnosis not present

## 2019-05-02 DIAGNOSIS — I2510 Atherosclerotic heart disease of native coronary artery without angina pectoris: Secondary | ICD-10-CM | POA: Diagnosis not present

## 2019-05-02 DIAGNOSIS — M47817 Spondylosis without myelopathy or radiculopathy, lumbosacral region: Secondary | ICD-10-CM | POA: Diagnosis not present

## 2019-05-02 DIAGNOSIS — M19041 Primary osteoarthritis, right hand: Secondary | ICD-10-CM | POA: Diagnosis not present

## 2019-05-02 DIAGNOSIS — M19012 Primary osteoarthritis, left shoulder: Secondary | ICD-10-CM

## 2019-05-02 DIAGNOSIS — M19072 Primary osteoarthritis, left ankle and foot: Secondary | ICD-10-CM

## 2019-05-02 DIAGNOSIS — N136 Pyonephrosis: Secondary | ICD-10-CM | POA: Diagnosis not present

## 2019-05-02 DIAGNOSIS — M19011 Primary osteoarthritis, right shoulder: Secondary | ICD-10-CM

## 2019-05-02 DIAGNOSIS — Z79899 Other long term (current) drug therapy: Secondary | ICD-10-CM

## 2019-05-02 DIAGNOSIS — I5032 Chronic diastolic (congestive) heart failure: Secondary | ICD-10-CM | POA: Diagnosis not present

## 2019-05-02 MED ORDER — FOLIC ACID 1 MG PO TABS: 1.0000 mg | ORAL_TABLET | Freq: Every day | ORAL | 1 refills | Status: DC

## 2019-05-02 NOTE — Patient Instructions (Signed)
Standing Labs We placed an order today for your standing lab work.    Please come back and get your standing labs in July and every 3 months  We have open lab daily Monday through Thursday from 8:30-12:30 PM and 1:30-4:30 PM and Friday from 8:30-12:30 PM and 1:30-4:00 PM at the office of Dr. Shaili Deveshwar.   You may experience shorter wait times on Monday and Friday afternoons. The office is located at [address], [address], [place], [place] [address] No appointment is necessary.   Labs are drawn by [facility].  You may receive a bill from [facility] for your lab work.  If you wish to have your labs drawn at another location, please call the office 24 hours in advance to send orders.  If you have any questions regarding directions or hours of operation,  please call [phone].   Just as a reminder please drink plenty of water prior to coming for your lab work. Thanks!  

## 2019-05-04 ENCOUNTER — Encounter (HOSPITAL_BASED_OUTPATIENT_CLINIC_OR_DEPARTMENT_OTHER): Payer: Self-pay | Admitting: Urology

## 2019-05-04 ENCOUNTER — Other Ambulatory Visit: Payer: Self-pay

## 2019-05-04 NOTE — Progress Notes (Addendum)
ADDENDUM:  Per anesthesia, Konrad Felix PA, ok to proceed with pt's pcp surgical clearance done today, office visit note in epic Cherly Beach NP)   Damaris Schooner w/ via phone for pre-op interview--- Pt's granddaughter, Thornell Sartorius, pt hoh over the phone Lab needs dos----   Istat 8            Lab results------ current ekg / cxr in chart/ epic COVID test ------ 05-05-2019 @ 1605 @[facility]  Arrive at ------- 1000 NPO after ------ [place] Medications to take morning of surgery ----- Norvasc, prednisone (if able to take without food), w/ sips of water Diabetic medication ----- does not take meds Patient Special Instructions ----- n/a Pre-Op special Istructions ----- n/a Patient granddaughter verbalized understanding of instructions that were given at this phone interview. Patient granddaughter stated pt denies shortness of breath, chest pain, fever, cough a this phone interview.  Anesthesia: PCP:  Dr [name] (lov 04-12-2019 epic) pt has pre-op surgical clearance visit for 05-08-2019 @0800 , note will be in epic Cardiologist :  Dr Harl Bowie [facility] 08-11-2018 epic, pt released) bradycardia resolved Rheumatologist:  Dr Chauncey Cruel. Deveshwar (lov 05-02-2019 epic) for RA/ OA Nephrologist:  Dr Florene Glen (lov 09-24-2017) for CKD II/ hypokalcemia (pt released) Chest x-ray : 04-06-2019  epic EKG : 04-03-2019 epic Echo : 12-15-2010  Epic Stress test:  Nuclear 05-25-2008 epic (under media) Cardiac Cath :  no Sleep Study/ CPAP :  NO Fasting Blood Sugar :      / Checks Blood Sugar -- times a day:  Diet controlled , does not check blood sugar at fome Blood Thinner/ Instructions Maryjane Hurter Dose: NO ASA / Instructions/ Last Dose :  NO

## 2019-05-05 ENCOUNTER — Other Ambulatory Visit (HOSPITAL_COMMUNITY)
Admission: RE | Admit: 2019-05-05 | Discharge: 2019-05-05 | Disposition: A | Discharge status: Home / Self Care | Payer: Medicare HMO | Source: Ambulatory Visit | Attending: Urology | Admitting: Urology

## 2019-05-08 ENCOUNTER — Ambulatory Visit: Payer: Medicare HMO

## 2019-05-08 ENCOUNTER — Other Ambulatory Visit: Payer: Self-pay | Admitting: *Deleted

## 2019-05-08 ENCOUNTER — Other Ambulatory Visit (HOSPITAL_COMMUNITY)
Admission: RE | Admit: 2019-05-08 | Discharge: 2019-05-08 | Disposition: A | Discharge status: Home / Self Care | Payer: Medicare HMO | Source: Ambulatory Visit | Attending: Urology | Admitting: Urology

## 2019-05-08 ENCOUNTER — Encounter: Payer: Self-pay | Admitting: Family Medicine

## 2019-05-08 ENCOUNTER — Ambulatory Visit (INDEPENDENT_AMBULATORY_CARE_PROVIDER_SITE_OTHER): Payer: Medicare HMO | Admitting: Family Medicine

## 2019-05-08 ENCOUNTER — Other Ambulatory Visit: Payer: Self-pay

## 2019-05-08 VITALS — BP 138/67 | HR 53 | Temp 97.9°F | Ht 59.0 in | Wt 134.0 lb

## 2019-05-08 DIAGNOSIS — Z01812 Encounter for preprocedural laboratory examination: Secondary | ICD-10-CM | POA: Insufficient documentation

## 2019-05-08 DIAGNOSIS — N139 Obstructive and reflux uropathy, unspecified: Secondary | ICD-10-CM

## 2019-05-08 DIAGNOSIS — N200 Calculus of kidney: Secondary | ICD-10-CM

## 2019-05-08 DIAGNOSIS — Z01818 Encounter for other preprocedural examination: Secondary | ICD-10-CM | POA: Insufficient documentation

## 2019-05-08 DIAGNOSIS — E1140 Type 2 diabetes mellitus with diabetic neuropathy, unspecified: Secondary | ICD-10-CM | POA: Insufficient documentation

## 2019-05-08 DIAGNOSIS — Z20822 Contact with and (suspected) exposure to covid-19: Secondary | ICD-10-CM | POA: Insufficient documentation

## 2019-05-08 LAB — SARS CORONAVIRUS 2 (TAT 6-24 HRS): SARS Coronavirus 2: NEGATIVE

## 2019-05-08 MED ORDER — UNABLE TO FIND: 0 refills | Status: DC

## 2019-05-08 NOTE — Progress Notes (Signed)
Received call from [facility] [place] scheduler for Dr Claudia Desanctis, state pt has a new surgery start time and Pam will inform pt to arrive 2 hours prior.

## 2019-05-08 NOTE — Assessment & Plan Note (Signed)
Diabetic peripheral neuropathy, foot exam detail done today.  Will be sending out so that she can get updated shoes.  Encouraged her to make sure that she eats the best diet for diabetic control.

## 2019-05-08 NOTE — Assessment & Plan Note (Signed)
Preop clearance provided.  Within the last 60 days she has had labs, EKG, urine screening. Is not on any blood thinners.  Overall is doing well and ready for the procedure

## 2019-05-08 NOTE — Assessment & Plan Note (Signed)
-  Surgery tomorrow to help clear stone.  Surgical clearance today.

## 2019-05-08 NOTE — Assessment & Plan Note (Signed)
Will be having lithotripsy tomorrow.  Clearance provided today.

## 2019-05-08 NOTE — Progress Notes (Signed)
Subjective:  Patient ID: Stacy Meyers, female    DOB: 12/08/1932  Age: 86 y.o. MRN: [id number]  CC:  Chief Complaint  Patient presents with  . Follow-up    surgery clarence for [facility] for kidney stones surgery 05-09-19      HPI  HPI   Stacy Meyers is an 86 year old female patient of Dr. Griffin Dakin.  Who is well-known to the clinic.  Who presents today to have surgery clearance for [facility] to remove kidney stones on May 09, 2019.  Additionally she would like to have foot exam for diabetic shoes replaced.  She declines having any issues or concerns today outside of these.  Reports that she is feeling much better since having sepsis about a month ago related to the stones.  She did recently lose her sister that has been very difficult over the last week to 2 weeks.  But she is starting to get back into her normal routine.  Today patient denies signs and symptoms of COVID 19 infection including fever, chills, cough, shortness of breath, and headache. Past Medical, Surgical, Social History, Allergies, and Medications have been Reviewed.   Past Medical History:  Diagnosis Date  . Anemia   . ARF (acute renal failure) ([place]) 12/14/2010  . CKD (chronic kidney disease), stage II    previously seen by nephrologist--- dr Florene Glen,  pt released per lov in epic 09-24-2017  . Diverticulosis of colon   . Essential hypertension, benign    followed by pcp   (pt had nuclear stress test (in epic under media) dated 05-25-2008 showed normal perfusion w/ no evidence ischemia, ef 83%  . History of bradycardia    previously seen by cardiologist, dr branch, pt released per lov 08-11-2018 in epic, bradycardia resolved  . History of sepsis    04-03-2019 hospital admission w/ urosepsis due to obstructive uropathy due to right ureter stone  . Hypokalemia   . Lipoma of colon    ascending  . Lumbar spondylosis   . Mixed hyperlipidemia   . Nephrolithiasis    per CT 04-03-2019 nonobstructive  bilateral stones  . Ocular herpes   . Osteoarthritis    followed by dr deveshwar--- hands, shoulders, feet  . Osteoporosis   . Rheumatoid arthritis(714.0)    rheumotologist-- dr s. Estanislado Pandy---  multiple sites, treated with methotrexate/ prednisone  . Right ureteral calculus   . Seasonal allergies   . SUI (stress urinary incontinence, female)   . Uterine fibroid     Current Meds  Medication Sig  . acetaminophen (TYLENOL) 500 MG tablet Take 1,000 mg by mouth every 6 (six) hours as needed (for pain).   Marland Kitchen acyclovir (ZOVIRAX) 400 MG tablet TAKE 1 TABLET BY MOUTH TWICE A DAY (Patient taking differently: Take 400 mg by mouth 2 (two) times daily. )  . alendronate (FOSAMAX) 70 MG tablet TAKE 1 TABLET BY MOUTH EVERY 7 DAYS. TAKE WITH A FULL GLASS OF WATER ON AN EMPTY STOMACH. (Patient taking differently: Take 70 mg by mouth every 7 (seven) days. )  . amLODipine (NORVASC) 5 MG tablet TAKE 1 TABLET BY MOUTH EVERY DAY (Patient taking differently: Take 5 mg by mouth daily. )  . azelastine (ASTELIN) 0.1 % nasal spray Place 2 sprays into both nostrils 2 (two) times daily. Use in each nostril as directed (Patient taking differently: Place 2 sprays into both nostrils as needed for allergies. )  . folic acid (FOLVITE) 1 MG tablet Take 1 tablet (1 mg total)  by mouth daily.  Marland Kitchen KLOR-CON M20 20 MEQ tablet TAKE 1 TABLET (20 MEQ TOTAL) BY MOUTH DAILY.  . methotrexate (RHEUMATREX) 2.5 MG tablet Please discuss with your physician before restarting this medication. (Patient taking differently: Take by mouth once a week. Take 3 tablets by mouth every Thursday.)  . montelukast (SINGULAIR) 10 MG tablet TAKE 1 TABLET BY MOUTH EVERYDAY AT BEDTIME (Patient taking differently: Take 10 mg by mouth at bedtime. )  . predniSONE (DELTASONE) 1 MG tablet TAKE 4 TABLETS (4 MG TOTAL) BY MOUTH DAILY WITH BREAKFAST. (Patient taking differently: Take 4 mg by mouth daily with breakfast. )  . Propylene Glycol (SYSTANE BALANCE OP) Place 1  drop into both eyes daily as needed (for dry eyes).   Marland Kitchen UNABLE TO FIND Diabetic shoes x 1 inserts x 3  Dx e11.9    ROS:  Review of Systems  Constitutional: Negative.   HENT: Negative.   Eyes: Negative.   Respiratory: Negative.   Cardiovascular: Negative.   Gastrointestinal: Negative.   Genitourinary: Negative.   Musculoskeletal: Negative.   Skin: Negative.   Neurological: Negative.   Endo/Heme/Allergies: Negative.   Psychiatric/Behavioral: Negative.   All other systems reviewed and are negative.    Objective:   Today's Vitals: BP 138/67 (BP Location: Right Arm, Patient Position: Sitting, Cuff Size: Normal)   Pulse (!) 53   Temp 97.9 F (36.6 C) (Temporal)   Ht 4\' 11"  (1.499 m)   Wt 134 lb (60.8 kg)   SpO2 96%   BMI 27.06 kg/m  Vitals with BMI 05/08/2019 05/04/2019 05/02/2019  Height 4\' 11"  4\' 11"  4\' 11"   Weight 134 lbs 134 lbs 131 lbs  BMI 27.05 [id number]  Systolic 0000000 - [id number]  Diastolic 67 - 73  Pulse 53 - 59     Physical Exam Vitals and nursing note reviewed.  Constitutional:      Appearance: Normal appearance. She is well-developed, well-groomed and overweight.  HENT:     Head: Normocephalic and atraumatic.     Right Ear: External ear normal.     Left Ear: External ear normal.     Mouth/Throat:     Comments: Mask in place  Eyes:     General:        Right eye: No discharge.        Left eye: No discharge.     Conjunctiva/sclera: Conjunctivae normal.  Cardiovascular:     Rate and Rhythm: Normal rate and regular rhythm.     Pulses:          Dorsalis pedis pulses are 2+ on the right side and 2+ on the left side.       Posterior tibial pulses are 1+ on the right side and 1+ on the left side.     Heart sounds: Normal heart sounds.  Pulmonary:     Effort: Pulmonary effort is normal.     Breath sounds: Normal breath sounds.  Musculoskeletal:     Cervical back: Normal range of motion and neck supple.     Right foot: Deformity and bunion present.     Left  foot: Deformity and bunion present.  Feet:     Right foot:     Protective Sensation: 5 sites tested. 3 sites sensed.     Skin integrity: Callus present.     Toenail Condition: Right toenails are abnormally thick. Fungal disease present.    Left foot:     Protective Sensation: 5 sites tested. 3 sites sensed.  Skin integrity: Callus present.     Toenail Condition: Left toenails are abnormally thick. Fungal disease present.    Comments: Hammer toes of second toe Skin:    General: Skin is warm.  Neurological:     General: No focal deficit present.     Mental Status: She is alert and oriented to person, place, and time.  Psychiatric:        Attention and Perception: Attention normal.        Mood and Affect: Mood normal.        Speech: Speech normal.        Behavior: Behavior normal. Behavior is cooperative.        Thought Content: Thought content normal.        Cognition and Memory: Cognition normal.        Judgment: Judgment normal.    Diabetic Foot Form - Detailed   Diabetic Foot Exam - detailed Diabetic Foot exam was performed with the following findings: Yes 05/08/2019  8:43 AM  Can the patient see the bottom of their feet?: Yes Are the shoes appropriate in style and fit?: Yes Is there swelling or and abnormal foot shape?: No Is there a claw toe deformity?: Yes Is there elevated skin temparature?: No Is there foot or ankle muscle weakness?: Yes Normal Range of Motion: Yes Pulse Foot Exam completed.: Yes  Right posterior Tibialias: Present Left posterior Tibialias: Present  Right Dorsalis Pedis: Present Left Dorsalis Pedis: Present  Semmes-Weinstein Monofilament Test R Site 1-Great Toe: Pos L Site 1-Great Toe: Neg    Comments: Decreased sensory noted on PE today in office. Is currently wearing DM shoes and needs new pair.     Assessment   1. Type 2 diabetes mellitus with diabetic neuropathy, without long-term current use of insulin ([place])   2. Right  nephrolithiasis-1.4cm Moderate right-sided urinary tract obstruction/hydronephrosis   3. Rt Sided Acute unilateral obstructive uropathy- 1.4 cm Prox Ureteric Stone   4. Preoperative clearance     Tests ordered No orders of the defined types were placed in this encounter.    Plan: Please see assessment and plan per problem list above.   No orders of the defined types were placed in this encounter.   Patient to follow-up in as scheduled .  Perlie Mayo, NP

## 2019-05-08 NOTE — Patient Instructions (Signed)
I appreciate the opportunity to provide you with care for your health and wellness. Today we discussed: upcoming procedures, and diabetic shoes  Follow up: 05/31/2019 as scheduled  No labs or referrals today  GOOD LUCK ON THESE PROCEDURES :)  Please continue to practice social distancing to keep you, your family, and our community safe.  If you must go out, please wear a mask and practice good handwashing.  It was a pleasure to see you and I look forward to continuing to work together on your health and well-being. Please do not hesitate to call the office if you need care or have questions about your care.  Have a wonderful day and week. With Gratitude, Cherly Beach, DNP, AGNP-BC

## 2019-05-09 ENCOUNTER — Ambulatory Visit (HOSPITAL_BASED_OUTPATIENT_CLINIC_OR_DEPARTMENT_OTHER): Payer: Medicare HMO | Admitting: Physician Assistant

## 2019-05-09 ENCOUNTER — Ambulatory Visit (HOSPITAL_BASED_OUTPATIENT_CLINIC_OR_DEPARTMENT_OTHER)
Admission: RE | Admit: 2019-05-09 | Discharge: 2019-05-09 | Disposition: A | Discharge status: Home / Self Care | Payer: Medicare HMO | Attending: Urology | Admitting: Urology

## 2019-05-09 ENCOUNTER — Other Ambulatory Visit: Payer: Self-pay

## 2019-05-09 ENCOUNTER — Encounter (HOSPITAL_BASED_OUTPATIENT_CLINIC_OR_DEPARTMENT_OTHER)
Admission: RE | Disposition: A | Discharge status: Home / Self Care | Payer: Self-pay | Source: Home / Self Care | Attending: Urology

## 2019-05-09 ENCOUNTER — Encounter (HOSPITAL_BASED_OUTPATIENT_CLINIC_OR_DEPARTMENT_OTHER): Payer: Self-pay | Admitting: Urology

## 2019-05-09 DIAGNOSIS — N201 Calculus of ureter: Secondary | ICD-10-CM | POA: Diagnosis not present

## 2019-05-09 DIAGNOSIS — I10 Essential (primary) hypertension: Secondary | ICD-10-CM | POA: Insufficient documentation

## 2019-05-09 DIAGNOSIS — E119 Type 2 diabetes mellitus without complications: Secondary | ICD-10-CM | POA: Insufficient documentation

## 2019-05-09 DIAGNOSIS — Z79899 Other long term (current) drug therapy: Secondary | ICD-10-CM | POA: Insufficient documentation

## 2019-05-09 DIAGNOSIS — I129 Hypertensive chronic kidney disease with stage 1 through stage 4 chronic kidney disease, or unspecified chronic kidney disease: Secondary | ICD-10-CM | POA: Diagnosis not present

## 2019-05-09 DIAGNOSIS — N179 Acute kidney failure, unspecified: Secondary | ICD-10-CM | POA: Diagnosis not present

## 2019-05-09 DIAGNOSIS — E1122 Type 2 diabetes mellitus with diabetic chronic kidney disease: Secondary | ICD-10-CM | POA: Diagnosis not present

## 2019-05-09 DIAGNOSIS — N182 Chronic kidney disease, stage 2 (mild): Secondary | ICD-10-CM | POA: Diagnosis not present

## 2019-05-09 HISTORY — PX: HOLMIUM LASER APPLICATION: SHX5852

## 2019-05-09 HISTORY — DX: Benign lipomatous neoplasm of intra-abdominal organs: D17.5

## 2019-05-09 HISTORY — PX: CYSTOSCOPY WITH RETROGRADE PYELOGRAM, URETEROSCOPY AND STENT PLACEMENT: SHX5789

## 2019-05-09 HISTORY — DX: Spondylosis without myelopathy or radiculopathy, lumbar region: M47.816

## 2019-05-09 HISTORY — DX: Stress incontinence (female) (male): N39.3

## 2019-05-09 HISTORY — DX: Calculus of kidney: N20.0

## 2019-05-09 HISTORY — DX: Leiomyoma of uterus, unspecified: D25.9

## 2019-05-09 HISTORY — DX: Chronic kidney disease, stage 2 (mild): N18.2

## 2019-05-09 HISTORY — DX: Diverticulosis of large intestine without perforation or abscess without bleeding: K57.30

## 2019-05-09 HISTORY — DX: Age-related osteoporosis without current pathological fracture: M81.0

## 2019-05-09 HISTORY — DX: Other seasonal allergic rhinitis: J30.2

## 2019-05-09 HISTORY — DX: Personal history of other specified conditions: Z87.898

## 2019-05-09 HISTORY — DX: Calculus of ureter: N20.1

## 2019-05-09 HISTORY — DX: Zoster ocular disease, unspecified: B02.30

## 2019-05-09 HISTORY — DX: Personal history of other infectious and parasitic diseases: Z86.19

## 2019-05-09 LAB — POCT I-STAT, CHEM 8
BUN: 27 mg/dL — ABNORMAL HIGH (ref 8–23)
Calcium, Ion: 1.24 mmol/L (ref 1.15–1.40)
Chloride: 109 mmol/L (ref 98–111)
Creatinine, Ser: 1 mg/dL (ref 0.44–1.00)
Glucose, Bld: 106 mg/dL — ABNORMAL HIGH (ref 70–99)
HCT: 38 % (ref 36.0–46.0)
Hemoglobin: 12.9 g/dL (ref 12.0–15.0)
Potassium: 4 mmol/L (ref 3.5–5.1)
Sodium: 143 mmol/L (ref 135–145)
TCO2: 25 mmol/L (ref 22–32)

## 2019-05-09 SURGERY — CYSTOURETEROSCOPY, WITH RETROGRADE PYELOGRAM AND STENT INSERTION
Anesthesia: General | Site: Ureter | Laterality: Right

## 2019-05-09 MED ORDER — SODIUM CHLORIDE 0.9 % IR SOLN
Status: DC | PRN
  Administered 2019-05-09: 3000 mL

## 2019-05-09 MED ORDER — PHENYLEPHRINE HCL (PRESSORS) 10 MG/ML IV SOLN
INTRAVENOUS | Status: DC | PRN
Start: 2019-05-09 — End: 2019-05-09
  Administered 2019-05-09 (×4): 40 ug via INTRAVENOUS

## 2019-05-09 MED ORDER — OXYCODONE HCL 5 MG PO TABS: 5.0000 mg | ORAL_TABLET | Freq: Once | ORAL | Status: DC | PRN

## 2019-05-09 MED ORDER — FENTANYL CITRATE (PF) 100 MCG/2ML IJ SOLN: 25.0000 ug | INTRAMUSCULAR | Status: DC | PRN

## 2019-05-09 MED ORDER — SODIUM CHLORIDE 0.9 % IV SOLN
INTRAVENOUS | Status: AC
  Filled 2019-05-09: qty 100

## 2019-05-09 MED ORDER — ONDANSETRON HCL 4 MG/2ML IJ SOLN: 4.0000 mg | Freq: Once | INTRAMUSCULAR | Status: DC | PRN

## 2019-05-09 MED ORDER — PHENYLEPHRINE 40 MCG/ML (10ML) SYRINGE FOR IV PUSH (FOR BLOOD PRESSURE SUPPORT)
PREFILLED_SYRINGE | INTRAVENOUS | Status: AC
  Filled 2019-05-09: qty 10

## 2019-05-09 MED ORDER — ONDANSETRON HCL 4 MG/2ML IJ SOLN
INTRAMUSCULAR | Status: AC
  Filled 2019-05-09: qty 2

## 2019-05-09 MED ORDER — LIDOCAINE 2% (20 MG/ML) 5 ML SYRINGE
INTRAMUSCULAR | Status: AC
  Filled 2019-05-09: qty 5

## 2019-05-09 MED ORDER — ACETAMINOPHEN 325 MG PO TABS: 325.0000 mg | ORAL_TABLET | ORAL | Status: DC | PRN

## 2019-05-09 MED ORDER — PROPOFOL 10 MG/ML IV BOLUS
INTRAVENOUS | Status: AC
  Filled 2019-05-09: qty 20

## 2019-05-09 MED ORDER — CEFTRIAXONE SODIUM 1 G IJ SOLR
INTRAMUSCULAR | Status: AC
  Filled 2019-05-09: qty 10

## 2019-05-09 MED ORDER — GLYCOPYRROLATE 0.2 MG/ML IJ SOLN
INTRAMUSCULAR | Status: DC | PRN
  Administered 2019-05-09: .1 mg via INTRAVENOUS

## 2019-05-09 MED ORDER — FENTANYL CITRATE (PF) 100 MCG/2ML IJ SOLN
INTRAMUSCULAR | Status: DC | PRN
  Administered 2019-05-09 (×6): 12.5 ug via INTRAVENOUS

## 2019-05-09 MED ORDER — MEPERIDINE HCL 25 MG/ML IJ SOLN: 6.2500 mg | INTRAMUSCULAR | Status: DC | PRN

## 2019-05-09 MED ORDER — ACETAMINOPHEN 160 MG/5ML PO SOLN: 325.0000 mg | ORAL | Status: DC | PRN

## 2019-05-09 MED ORDER — SODIUM CHLORIDE 0.9 % IV SOLN
1.0000 g | INTRAVENOUS | Status: AC
  Administered 2019-05-09: 2 g via INTRAVENOUS

## 2019-05-09 MED ORDER — OXYCODONE HCL 5 MG/5ML PO SOLN: 5.0000 mg | Freq: Once | ORAL | Status: DC | PRN

## 2019-05-09 MED ORDER — PROPOFOL 10 MG/ML IV BOLUS
INTRAVENOUS | Status: DC | PRN
  Administered 2019-05-09: 100 mg via INTRAVENOUS
  Administered 2019-05-09: 50 mg via INTRAVENOUS

## 2019-05-09 MED ORDER — GLYCOPYRROLATE PF 0.2 MG/ML IJ SOSY
PREFILLED_SYRINGE | INTRAMUSCULAR | Status: AC
  Filled 2019-05-09: qty 1

## 2019-05-09 MED ORDER — ONDANSETRON HCL 4 MG/2ML IJ SOLN
INTRAMUSCULAR | Status: DC | PRN
  Administered 2019-05-09: 4 mg via INTRAVENOUS

## 2019-05-09 MED ORDER — FENTANYL CITRATE (PF) 100 MCG/2ML IJ SOLN
INTRAMUSCULAR | Status: AC
  Filled 2019-05-09: qty 2

## 2019-05-09 MED ORDER — SODIUM CHLORIDE 0.9 % IV SOLN
INTRAVENOUS | Status: DC
  Administered 2019-05-09: 1000 mL via INTRAVENOUS

## 2019-05-09 MED ORDER — LIDOCAINE HCL (CARDIAC) PF 100 MG/5ML IV SOSY
PREFILLED_SYRINGE | INTRAVENOUS | Status: DC | PRN
  Administered 2019-05-09: 40 mg via INTRAVENOUS

## 2019-05-09 SURGICAL SUPPLY — 22 items
BAG DRAIN URO-CYSTO SKYTR STRL (DRAIN) ×3 IMPLANT
BAG DRN UROCATH (DRAIN) ×1
BASKET ZERO TIP NITINOL 2.4FR (BASKET) ×2 IMPLANT
BSKT STON RTRVL ZERO TP 2.4FR (BASKET) ×1
CATH URET 5FR 28IN OPEN ENDED (CATHETERS) ×3 IMPLANT
CLOTH BEACON ORANGE TIMEOUT ST (SAFETY) ×3 IMPLANT
DRSG TEGADERM 4X4.75 (GAUZE/BANDAGES/DRESSINGS) IMPLANT
EXTRACTOR STONE 1.7FRX115CM (UROLOGICAL SUPPLIES) IMPLANT
FIBER LASER TRAC TIP (UROLOGICAL SUPPLIES) ×2 IMPLANT
GLOVE BIO SURGEON STRL SZ 6.5 (GLOVE) ×3 IMPLANT
GLOVE BIO SURGEONS STRL SZ 6.5 (GLOVE) ×2
GOWN STRL REUS W/TWL LRG LVL3 (GOWN DISPOSABLE) ×5 IMPLANT
GUIDEWIRE STR DUAL SENSOR (WIRE) ×5 IMPLANT
IV NS IRRIG 3000ML ARTHROMATIC (IV SOLUTION) ×3 IMPLANT
KIT TURNOVER CYSTO (KITS) ×3 IMPLANT
MANIFOLD NEPTUNE II (INSTRUMENTS) ×3 IMPLANT
SHEATH URETERAL 12FRX28CM (UROLOGICAL SUPPLIES) ×2 IMPLANT
STENT URET 6FRX24 CONTOUR (STENTS) ×2 IMPLANT
TRAY CYSTO PACK (CUSTOM PROCEDURE TRAY) ×3 IMPLANT
TUBE CONNECTING 12'X1/4 (SUCTIONS) ×1
TUBE CONNECTING 12X1/4 (SUCTIONS) ×2 IMPLANT
TUBING UROLOGY SET (TUBING) ×3 IMPLANT

## 2019-05-09 NOTE — Discharge Instructions (Signed)
DISCHARGE INSTRUCTIONS FOR KIDNEY STONE/URETERAL STENT   MEDICATIONS:  1.  Resume all your other meds from home - except do not take any extra narcotic pain meds that you may have at home.  3.   taking up to 1000 mg every 6 hours of plainTylenol will help treat your pain.     ACTIVITY:  1. No strenuous activity x 1week  2. No driving while on narcotic pain medications  3. Drink plenty of water  4. Continue to walk at home - you can still get blood clots when you are at home, so keep active, but don't over do it.  5. May return to work/school tomorrow or when you feel ready   BATHING:  1. You can shower and we recommend daily showers  2. You have a string coming from your urethra: The stent string is attached to your ureteral stent. Do not pull on this.   SIGNS/SYMPTOMS TO CALL:  Please call us if you have a fever greater than 101.5, uncontrolled nausea/vomiting, uncontrolled pain, dizziness, unable to urinate, bloody urine, chest pain, shortness of breath, leg swelling, leg pain, redness around wound, drainage from wound, or any other concerns or questions.   You can reach [place] at [phone].   FOLLOW-UP:  1. You will be contacted with appointment in 6 weeks with a ultrasound of your kidneys prior.   2. You have a string attached to your stent, you may remove it on Monday, 05/15/19 . To do this, pull the strings until the stents are completely removed. You may feel an odd sensation in your back.  Please take 1 tab of cephalexin prior to doing this (keflex).     Post Anesthesia Home Care Instructions  Activity: Get plenty of rest for the remainder of the day. A responsible individual must stay with you for 24 hours following the procedure.  For the next 24 hours, DO NOT: -Drive a car -Paediatric nurse -Drink alcoholic beverages -Take any medication unless instructed by your physician -Make any legal decisions or sign important papers.  Meals: Start with liquid foods such as  gelatin or soup. Progress to regular foods as tolerated. Avoid greasy, spicy, heavy foods. If nausea and/or vomiting occur, drink only clear liquids until the nausea and/or vomiting subsides. Call your physician if vomiting continues.  Special Instructions/Symptoms: Your throat may feel dry or sore from the anesthesia or the breathing tube placed in your throat during surgery. If this causes discomfort, gargle with warm salt water. The discomfort should disappear within 24 hours.

## 2019-05-09 NOTE — Interval H&P Note (Signed)
History and Physical Interval Note: Patient has been treated with antibiotics for positive urine culture.  Will receive antibiotics intraop and remain on postop prior to stent removal.   05/09/2019 9:13 AM  Stacy Meyers  has presented today for surgery, with the diagnosis of RIGHT URETERAL CALCULUS.  The various methods of treatment have been discussed with the patient and family. After consideration of risks, benefits and other options for treatment, the patient has consented to  Procedure(s) with comments: CYSTOSCOPY WITH RETROGRADE PYELOGRAM, URETEROSCOPY AND STENT PLACEMENT (Right) - 1 HR HOLMIUM LASER APPLICATION (Right) as a surgical intervention.  The patient's history has been reviewed, patient examined, no change in status, stable for surgery.  I have reviewed the patient's chart and labs.  Questions were answered to the patient's satisfaction.     Vedansh Kerstetter D Amadu Schlageter

## 2019-05-09 NOTE — Anesthesia Procedure Notes (Signed)
Procedure Name: LMA Insertion Date/Time: 05/09/2019 10:10 AM Performed by: Justice Rocher, CRNA Pre-anesthesia Checklist: Patient identified, Emergency Drugs available, Suction available, Patient being monitored and Timeout performed Patient Re-evaluated:Patient Re-evaluated prior to induction Oxygen Delivery Method: Circle system utilized Preoxygenation: Pre-oxygenation with 100% oxygen Induction Type: IV induction Ventilation: Mask ventilation without difficulty LMA: LMA inserted LMA Size: 3.0 Number of attempts: 1 Airway Equipment and Method: Bite block Placement Confirmation: positive ETCO2,  breath sounds checked- equal and bilateral and CO2 detector Tube secured with: Tape Dental Injury: Teeth and Oropharynx as per pre-operative assessment

## 2019-05-09 NOTE — Transfer of Care (Signed)
Immediate Anesthesia Transfer of Care Note  Patient: Stacy Meyers  Procedure(s) Performed: CYSTOSCOPY WITH RETROGRADE PYELOGRAM, URETEROSCOPY AND STENT PLACEMENT (Right Ureter) HOLMIUM LASER APPLICATION (Right Ureter)  Patient Location: PACU  Anesthesia Type:General  Level of Consciousness: awake, drowsy and patient cooperative  Airway & Oxygen Therapy: Patient Spontanous Breathing and Patient connected to nasal cannula oxygen  Post-op Assessment: Report given to RN and Post -op Vital signs reviewed and stable  Post vital signs: Reviewed and stable  Last Vitals:  Vitals Value Taken Time  BP 133/71 05/09/19 1112  Temp    Pulse 69 05/09/19 1112  Resp 18 05/09/19 1113  SpO2 96 % 05/09/19 1112  Vitals shown include unvalidated device data.  Last Pain:  Vitals:   05/09/19 0937  PainSc: 0-No pain      Patients Stated Pain Goal: 2 (0000000 [id number])  Complications: No apparent anesthesia complications

## 2019-05-09 NOTE — Anesthesia Preprocedure Evaluation (Addendum)
Anesthesia Evaluation  Patient identified by MRN, date of birth, ID band Patient awake    Reviewed: Allergy & Precautions, NPO status , Patient's Chart, lab work & pertinent test results, reviewed documented beta blocker date and time   Airway Mallampati: II  TM Distance: >3 FB Neck ROM: Full    Dental no notable dental hx.    Pulmonary neg pulmonary ROS,    Pulmonary exam normal breath sounds clear to auscultation       Cardiovascular hypertension, Pt. on medications Normal cardiovascular exam+ dysrhythmias + Valvular Problems/Murmurs  Rhythm:Regular Rate:Normal  ECHO 12' Study Conclusions   - Left ventricle: The cavity size was normal. There was mild  focal basal hypertrophy of the septum. Systolic function  was normal. The estimated ejection fraction was in the  range of 55% to 60%. Wall motion was normal; there were no  regional wall motion abnormalities. Doppler parameters are  consistent with abnormal left ventricular relaxation  (grade 1 diastolic dysfunction).  - Mitral valve: Mild regurgitation.  - Left atrium: The atrium was mildly dilated.  - Atrial septum: No defect or patent foramen ovale was  identified. There was an atrial septal aneurysm.  - Tricuspid valve: Mild regurgitation.     Neuro/Psych negative neurological ROS  negative psych ROS   GI/Hepatic negative GI ROS, Neg liver ROS,   Endo/Other  diabetes, Type 2  Renal/GU ARFRenal disease  negative genitourinary   Musculoskeletal  (+) Arthritis , Osteoarthritis and Rheumatoid disorders,    Abdominal   Peds negative pediatric ROS (+)  Hematology  (+) Blood dyscrasia, anemia ,   Anesthesia Other Findings   Reproductive/Obstetrics negative OB ROS                            Anesthesia Physical  Anesthesia Plan  ASA: III  Anesthesia Plan: General   Post-op Pain Management:    Induction:  Intravenous  PONV Risk Score and Plan: 3 and Ondansetron, Dexamethasone and Treatment may vary due to age or medical condition  Airway Management Planned: LMA  Additional Equipment:   Intra-op Plan:   Post-operative Plan: Extubation in OR  Informed Consent: I have reviewed the patients History and Physical, chart, labs and discussed the procedure including the risks, benefits and alternatives for the proposed anesthesia with the patient or authorized representative who has indicated his/her understanding and acceptance.     Dental advisory given  Plan Discussed with: CRNA, Anesthesiologist and Surgeon  Anesthesia Plan Comments: ( )        Anesthesia Quick Evaluation

## 2019-05-09 NOTE — Anesthesia Postprocedure Evaluation (Signed)
Anesthesia Post Note  Patient: Stacy Meyers  Procedure(s) Performed: CYSTOSCOPY WITH RETROGRADE PYELOGRAM, URETEROSCOPY AND STENT PLACEMENT (Right Ureter) HOLMIUM LASER APPLICATION (Right Ureter)     Patient location during evaluation: PACU Anesthesia Type: General Level of consciousness: awake and alert Pain management: pain level controlled Vital Signs Assessment: post-procedure vital signs reviewed and stable Respiratory status: spontaneous breathing, nonlabored ventilation, respiratory function stable and patient connected to nasal cannula oxygen Cardiovascular status: blood pressure returned to baseline and stable Postop Assessment: no apparent nausea or vomiting Anesthetic complications: no    Last Vitals:  Vitals:   05/09/19 1207 05/09/19 1250  BP: 140/65 (!) 149/61  Pulse: 73 61  Resp: 18 16  Temp:  36.5 C  SpO2: 93% 93%    Last Pain:  Vitals:   05/09/19 1250  TempSrc: Oral  PainSc: 0-No pain                 Kayn Haymore

## 2019-05-09 NOTE — H&P (Signed)
CC/HPI: cc: right ureteral calculus   04/17/19: 86 year old woman who presents the ER with fever found to have a right ureteral calculus status post right ureteral stent placement on 04/03/2019. She has been discharged from the hospital and is feeling well. She denies pain, fever, chills, or gross hematuria.     ALLERGIES: Aspirin Sudafed    MEDICATIONS: Acyclovir  Alendronate Sodium  Folic Acid  Klor Con  Methotrexate  Prednisone  Rosuvastatin Calcium  Singulair     GU PSH: Cystoscopy Insert Stent, Right - 04/03/2019     NON-GU PSH: Back Surgery (Unspecified) Cholecystectomy (laparoscopic)     GU PMH: None   NON-GU PMH: Arthritis GERD    FAMILY HISTORY: stroke - Mother   SOCIAL HISTORY: Marital Status: Widowed Preferred Language: English; Ethnicity: Not Hispanic Or Latino; Race: Black or African American Current Smoking Status: Patient has never smoked.   Tobacco Use Assessment Completed: Used Tobacco in last 30 days? Has never drank.  Does not drink caffeine.    REVIEW OF SYSTEMS:    GU Review Female:   Patient reports frequent urination, get up at night to urinate, and leakage of urine. Patient denies hard to postpone urination, burning /pain with urination, stream starts and stops, trouble starting your stream, have to strain to urinate, and being pregnant.  Gastrointestinal (Upper):   Patient denies nausea, vomiting, and indigestion/ heartburn.  Gastrointestinal (Lower):   Patient denies diarrhea and constipation.  Constitutional:   Patient denies fever, night sweats, weight loss, and fatigue.  Skin:   Patient denies skin rash/ lesion and itching.  Eyes:   Patient denies blurred vision and double vision.  Ears/ Nose/ Throat:   Patient denies sore throat and sinus problems.  Hematologic/Lymphatic:   Patient denies swollen glands and easy bruising.  Cardiovascular:   Patient denies leg swelling and chest pains.  Respiratory:   Patient denies cough and shortness  of breath.  Endocrine:   Patient denies excessive thirst.  Musculoskeletal:   Patient reports joint pain. Patient denies back pain.  Neurological:   Patient denies headaches and dizziness.  Psychologic:   Patient denies depression and anxiety.   VITAL SIGNS:      04/17/2019 02:43 PM  Weight 137 lb / 62.14 kg  Height 59 in / 149.86 cm  BP 144/64 mmHg  Pulse 59 /min  Temperature 97.8 F / 36.5 C  BMI 27.7 kg/m   MULTI-SYSTEM PHYSICAL EXAMINATION:    Constitutional: Well-nourished. No physical deformities. Normally developed. Good grooming.  Neck: Neck symmetrical, not swollen. Normal tracheal position.  Respiratory: No labored breathing, no use of accessory muscles.   Cardiovascular: Normal temperature  Skin: No paleness, no jaundice, no cyanosis. No lesion, no ulcer, no rash.  Neurologic / Psychiatric: Oriented to time, oriented to place, oriented to person. No depression, no anxiety, no agitation.  Gastrointestinal: No rigidity, non obese abdomen.   Eyes: Normal conjunctivae. Normal eyelids.  Ears, Nose, Mouth, and Throat: Left ear no scars, no lesions, no masses. Right ear no scars, no lesions, no masses. Nose no scars, no lesions, no masses. Normal hearing. Normal lips.  Musculoskeletal: Normal gait and station of head and neck.     Complexity of Data:  Records Review:   POC Tool  Urine Test Review:   Urinalysis  X-Ray Review: C.T. Abdomen/Pelvis: Reviewed Films. Reviewed Report. Discussed With Patient.    Notes:  04/06/2019: BUN 27, creatinine 0.9   PROCEDURES:          Urinalysis w/Scope Dipstick Dipstick Cont'd Micro  Color: Straw Bilirubin: Neg mg/dL WBC/hpf: 10 - 20/hpf  Appearance: Clear Ketones: Neg mg/dL RBC/hpf: 3 - 10/hpf  Specific Gravity: 1.020 Blood: 2+ ery/uL Bacteria: Rare (0-9/hpf)  pH: 6.0 Protein: 2+ mg/dL Cystals: NS (Not Seen)  Glucose: Neg mg/dL Urobilinogen: 0.2 mg/dL Casts: NS (Not Seen)    Nitrites: Positive Trichomonas: Not  Present    Leukocyte Esterase: 3+ leu/uL Mucous: Not Present      Epithelial Cells: NS (Not Seen)      Yeast: NS (Not Seen)      Sperm: Not Present    Notes: unspun microscopic performed    ASSESSMENT:      ICD-10 Details  1 GU:   Ureteral calculus - N20.1 Right, Acute, Uncomplicated - Patient to be scheduled for a right ureteroscopy with laser lithotripsy and stent exchange. We discussed the risk benefits of procedure in the office today. She will be scheduled for the next available date.   PLAN:           Orders Labs CULTURE, URINE

## 2019-05-09 NOTE — Op Note (Signed)
Preoperative diagnosis: right ureteral calculus  Postoperative diagnosis: right ureteral calculus  Procedure:  1. Cystoscopy 2. right ureteroscopy and stone removal 3. Ureteroscopic laser lithotripsy 4. right 91F x 24cm ureteral stent placement with string  Surgeon: Jacalyn Lefevre, MD  Anesthesia: General  Complications: None  Intraoperative findings:  1. Normal urethra 2. Bladder mucosa without masses/abnormalities/stones  EBL: Minimal  Specimens: 1. right ureteral calculus  Disposition of specimens: [facility] for stone analysis  Indication: Stacy Meyers is a 86 y.o.   patient with a 1.4cm right proximal ureteral stone and associated right symptoms. She had urgent stent placement and was treated in the hospital for bacteremia. She now returns for definitive management of the stone. After reviewing the management options for treatment, the patient elected to proceed with the above surgical procedure(s). We have discussed the potential benefits and risks of the procedure, side effects of the proposed treatment, the likelihood of the patient achieving the goals of the procedure, and any potential problems that might occur during the procedure or recuperation. Informed consent has been obtained.   Description of procedure:  The patient was taken to the operating room and general anesthesia was induced.  The patient was placed in the dorsal lithotomy position, prepped and draped in the usual sterile fashion, and preoperative antibiotics were administered. A preoperative time-out was performed.   Cystourethroscopy was performed.  The patient's urethra was examined and was normal. The bladder was then systematically examined in its entirety. There was no evidence for any bladder tumors, stones, or other mucosal pathology.    Attention then turned to the right ureteral orifice and graspers were used to grab the existing ureteral stent and bring it to the urethral  meatus.  A 0.38 sensor guidewire was then advanced up the stent into the ureter into the renal pelvis under fluoroscopic guidance. The stent was then removed. A second sensor wire was placed through the scope and advanced into the ureteral orifice and up to the kidney with fluoroscopic guidance. The cystoscope was removed. Next a ureteral access sheath was placed over one of the wires and advanced to the kidney with fluoroscopic guidance. The inner sheath and wire removed. Flexible ureteroscopy then took place and saw the 1.4 cm stone in the upper pole.    The stone was then fragmented with the 200 micron holmium laser fiber and the larger stone fragments were then removed from the ureter with an 0 tip basket.  Reinspection of the ureter revealed no remaining visible stones or fragments larger than 1 mm.   The wire was then backloaded through the cystoscope and a ureteral stent was advance over the wire using Seldinger technique.  The stent was positioned appropriately under fluoroscopic and cystoscopic guidance.  The wire was then removed with an adequate stent curl noted in the renal pelvis as well as in the bladder.  The bladder was then emptied and the procedure ended.  The patient appeared to tolerate the procedure well and without complications.  The patient was able to be awakened and transferred to the recovery unit in satisfactory condition.   Disposition: The tether of the stent was left on and tucked inside the patient's vagina.  Instructions for removing the stent have been provided to the patient.

## 2019-05-11 DIAGNOSIS — I2510 Atherosclerotic heart disease of native coronary artery without angina pectoris: Secondary | ICD-10-CM | POA: Diagnosis not present

## 2019-05-11 DIAGNOSIS — A4151 Sepsis due to Escherichia coli [E. coli]: Secondary | ICD-10-CM | POA: Diagnosis not present

## 2019-05-11 DIAGNOSIS — M4316 Spondylolisthesis, lumbar region: Secondary | ICD-10-CM | POA: Diagnosis not present

## 2019-05-11 DIAGNOSIS — N136 Pyonephrosis: Secondary | ICD-10-CM | POA: Diagnosis not present

## 2019-05-11 DIAGNOSIS — M1711 Unilateral primary osteoarthritis, right knee: Secondary | ICD-10-CM | POA: Diagnosis not present

## 2019-05-11 DIAGNOSIS — M47817 Spondylosis without myelopathy or radiculopathy, lumbosacral region: Secondary | ICD-10-CM | POA: Diagnosis not present

## 2019-05-11 DIAGNOSIS — E119 Type 2 diabetes mellitus without complications: Secondary | ICD-10-CM | POA: Diagnosis not present

## 2019-05-11 DIAGNOSIS — M0579 Rheumatoid arthritis with rheumatoid factor of multiple sites without organ or systems involvement: Secondary | ICD-10-CM | POA: Diagnosis not present

## 2019-05-11 DIAGNOSIS — I5032 Chronic diastolic (congestive) heart failure: Secondary | ICD-10-CM | POA: Diagnosis not present

## 2019-05-11 DIAGNOSIS — I110 Hypertensive heart disease with heart failure: Secondary | ICD-10-CM | POA: Diagnosis not present

## 2019-05-16 ENCOUNTER — Ambulatory Visit: Payer: Medicare Other | Admitting: Family Medicine

## 2019-05-19 ENCOUNTER — Other Ambulatory Visit: Payer: Self-pay | Admitting: Family Medicine

## 2019-05-19 DIAGNOSIS — R7989 Other specified abnormal findings of blood chemistry: Secondary | ICD-10-CM

## 2019-05-19 DIAGNOSIS — E785 Hyperlipidemia, unspecified: Secondary | ICD-10-CM

## 2019-05-19 DIAGNOSIS — E559 Vitamin D deficiency, unspecified: Secondary | ICD-10-CM

## 2019-05-19 DIAGNOSIS — I10 Essential (primary) hypertension: Secondary | ICD-10-CM

## 2019-05-19 DIAGNOSIS — E669 Obesity, unspecified: Secondary | ICD-10-CM

## 2019-05-19 DIAGNOSIS — M0579 Rheumatoid arthritis with rheumatoid factor of multiple sites without organ or systems involvement: Secondary | ICD-10-CM

## 2019-05-31 ENCOUNTER — Encounter: Payer: Medicare Other | Admitting: Family Medicine

## 2019-06-02 ENCOUNTER — Telehealth: Payer: Medicare HMO

## 2019-06-02 ENCOUNTER — Other Ambulatory Visit: Payer: Self-pay

## 2019-06-15 ENCOUNTER — Telehealth: Payer: Self-pay

## 2019-06-15 ENCOUNTER — Other Ambulatory Visit: Payer: Self-pay | Admitting: Rheumatology

## 2019-06-15 ENCOUNTER — Other Ambulatory Visit: Payer: Self-pay | Admitting: Family Medicine

## 2019-06-15 ENCOUNTER — Other Ambulatory Visit: Payer: Self-pay

## 2019-06-15 DIAGNOSIS — Z87898 Personal history of other specified conditions: Secondary | ICD-10-CM

## 2019-06-15 NOTE — Telephone Encounter (Signed)
PT LVM that she needs a Mammo & a appt , please call the pt back

## 2019-06-15 NOTE — Telephone Encounter (Signed)
ok 

## 2019-06-15 NOTE — Telephone Encounter (Signed)
Mammogram ordered and scheduled and appt with Dr.Simpson scheduled. Patient aware of both appointments.

## 2019-06-15 NOTE — Telephone Encounter (Signed)
Last Visit: 05/02/2019 Next Visit: 10/03/2019 Labs: 04/06/2019 CBC: WBC 11.8, RBC 3.50, Hgb 10.8, Hct 34.1, Platelets 135, nRBC 0.4, Neutro Abs 9.7, Abs Immature Granulocytes 0.18  BMP: Chloride 115 CO2 21 Glucose 113, BUN 27, GFR 58  Current Dose per office note 05/02/2019: methotrexate 3 tablets by mouth once weekly  Okay to refill MTX?

## 2019-06-21 ENCOUNTER — Ambulatory Visit (INDEPENDENT_AMBULATORY_CARE_PROVIDER_SITE_OTHER): Payer: Medicare HMO | Admitting: Family Medicine

## 2019-06-21 ENCOUNTER — Other Ambulatory Visit: Payer: Self-pay

## 2019-06-21 ENCOUNTER — Encounter: Payer: Self-pay | Admitting: Family Medicine

## 2019-06-21 VITALS — BP 116/72 | HR 75 | Temp 97.3°F | Resp 15 | Ht 59.0 in | Wt 135.0 lb

## 2019-06-21 DIAGNOSIS — E118 Type 2 diabetes mellitus with unspecified complications: Secondary | ICD-10-CM

## 2019-06-21 DIAGNOSIS — I10 Essential (primary) hypertension: Secondary | ICD-10-CM

## 2019-06-21 DIAGNOSIS — B009 Herpesviral infection, unspecified: Secondary | ICD-10-CM

## 2019-06-21 DIAGNOSIS — M0579 Rheumatoid arthritis with rheumatoid factor of multiple sites without organ or systems involvement: Secondary | ICD-10-CM

## 2019-06-21 DIAGNOSIS — E119 Type 2 diabetes mellitus without complications: Secondary | ICD-10-CM | POA: Diagnosis not present

## 2019-06-21 DIAGNOSIS — L299 Pruritus, unspecified: Secondary | ICD-10-CM | POA: Diagnosis not present

## 2019-06-21 DIAGNOSIS — E785 Hyperlipidemia, unspecified: Secondary | ICD-10-CM | POA: Diagnosis not present

## 2019-06-21 DIAGNOSIS — E663 Overweight: Secondary | ICD-10-CM | POA: Diagnosis not present

## 2019-06-21 NOTE — Patient Instructions (Addendum)
Wellness to be scheduled, when due  Annual physical exam with MD in 4 months   No medication changes  Fasting lipid, cmp and EGFR and TSH , 1 week before appointment  Nizoral shampoo which is now OTC will help with itch, scalp looks normal  Carefull not to fall  Thanks for choosing [facility], we consider it a privelige to serve you.

## 2019-06-22 ENCOUNTER — Other Ambulatory Visit: Payer: Self-pay | Admitting: Rheumatology

## 2019-06-22 ENCOUNTER — Encounter: Payer: Self-pay | Admitting: Family Medicine

## 2019-06-22 DIAGNOSIS — L299 Pruritus, unspecified: Secondary | ICD-10-CM | POA: Insufficient documentation

## 2019-06-22 NOTE — Progress Notes (Signed)
RAHA TENNISON     MRN: [id number]      DOB: 05/01/1932   HPI Ms. Kithcart is here for follow up and re-evaluation of chronic medical conditions, medication management and review of any available recent lab and radiology data.  Preventive health is updated, specifically  Cancer screening and Immunization.   Recent hospitalization for urosepsis is reviewed with the patient and  her and daughter, she has recovered fully from this  The PT denies any adverse reactions to current medications since the last visit.  C/o itchy scalp, no flaking  ROS Denies recent fever or chills. Denies sinus pressure, nasal congestion, ear pain or sore throat. Denies chest congestion, productive cough or wheezing. Denies chest pains, palpitations and leg swelling Denies abdominal pain, nausea, vomiting,diarrhea or constipation.   Denies dysuria, frequency, hesitancy or incontinence. C/o chronic  joint pain, swelling and limitation in mobility. Denies headaches, seizures, numbness, or tingling. Denies depression, anxiety or insomnia.  PE  BP 116/72   Pulse 75   Temp (!) 97.3 F (36.3 C) (Temporal)   Resp 15   Ht 4\' 11"  (1.499 m)   Wt 135 lb (61.2 kg)   SpO2 96%   BMI 27.27 kg/m   Patient alert and oriented and in no cardiopulmonary distress.  HEENT: No facial asymmetry, EOMI,     Neck supple .  Chest: Clear to auscultation bilaterally.  CVS: S1, S2 no murmurs, no S3.Regular rate.  ABD: Soft non tender.   Ext: No edema  MS: decreased  ROM spine, shoulders, hips and knees.  Skin: Intact, no ulcerations or rash noted.  Psych: Good eye contact, normal affect. Memory intact not anxious or depressed appearing.  CNS: CN 2-12 intact, power,  normal throughout.no focal deficits noted.   Assessment & Plan  Essential hypertension, benign Controlled, no change in medication DASH diet and commitment to daily physical activity for a minimum of 30 minutes discussed and encouraged, as a part of  hypertension management. The importance of attaining a healthy weight is also discussed.  BP/Weight 06/21/2019 05/09/2019 05/08/2019 05/02/2019 04/12/2019 04/06/2019 4/43/1540  Systolic BP 086 761 950 932 671 245 -  Diastolic BP 72 61 67 73 67 79 -  Wt. (Lbs) 135 132.3 134 131 137.6 - 149.3  BMI 27.27 26.72 27.06 26.46 27.79 - 30.15       Type 2 diabetes mellitus with complication, without long-term current use of insulin ([place]) Ms. Stenseth is reminded of the importance of commitment to daily physical activity for 30 minutes or more, as able and the need to limit carbohydrate intake to 30 to 60 grams per meal to help with blood sugar control.   The need to take medication as prescribed, test blood sugar as directed, and to call between visits if there is a concern that blood sugar is uncontrolled is also discussed.   Ms. Shedlock is reminded of the importance of daily foot exam, annual eye examination, and good blood sugar, blood pressure and cholesterol control.  Diabetic Labs Latest Ref Rng & Units 05/09/2019 04/06/2019 04/05/2019 04/04/2019 04/03/2019  HbA1c <5.7 % of total Hgb - - - - -  Microalbumin mg/dL - - - - -  Micro/Creat Ratio <30 mcg/mg creat - - - - -  Chol <200 mg/dL - - - - -  HDL > OR = 50 mg/dL - - - - -  Calc LDL mg/dL (calc) - - - - -  Triglycerides <150 mg/dL - - - - -  Creatinine 0.44 - 1.00 mg/dL 1.00 0.90 1.04(H) 1.30(H) 1.86(H)   BP/Weight 06/21/2019 05/09/2019 05/08/2019 05/02/2019 04/12/2019 04/06/2019 9/62/8366  Systolic BP 294 765 465 035 465 681 -  Diastolic BP 72 61 67 73 67 79 -  Wt. (Lbs) 135 132.3 134 131 137.6 - 149.3  BMI 27.27 26.72 27.06 26.46 27.79 - 30.15   Foot/eye exam completion dates Latest Ref Rng & Units 05/08/2019 12/07/2018  Eye Exam No Retinopathy - -  Foot Form Completion - Done Done        Type 2 diabetes, diet controlled ([place]) Ms. Ollinger is reminded of the importance of commitment to daily physical activity for 30 minutes or more, as able and the need to  limit carbohydrate intake to 30 to 60 grams per meal to help with blood sugar control.      Ms. Stare is reminded of the importance of daily foot exam, annual eye examination, and good blood sugar, blood pressure and cholesterol control.  Diabetic Labs Latest Ref Rng & Units 05/09/2019 04/06/2019 04/05/2019 04/04/2019 04/03/2019  HbA1c <5.7 % of total Hgb - - - - -  Microalbumin mg/dL - - - - -  Micro/Creat Ratio <30 mcg/mg creat - - - - -  Chol <200 mg/dL - - - - -  HDL > OR = 50 mg/dL - - - - -  Calc LDL mg/dL (calc) - - - - -  Triglycerides <150 mg/dL - - - - -  Creatinine 0.44 - 1.00 mg/dL 1.00 0.90 1.04(H) 1.30(H) 1.86(H)   BP/Weight 06/21/2019 05/09/2019 05/08/2019 05/02/2019 04/12/2019 04/06/2019 2/75/1700  Systolic BP 174 944 967 591 638 466 -  Diastolic BP 72 61 67 73 67 79 -  Wt. (Lbs) 135 132.3 134 131 137.6 - 149.3  BMI 27.27 26.72 27.06 26.46 27.79 - 30.15   Foot/eye exam completion dates Latest Ref Rng & Units 05/08/2019 12/07/2018  Eye Exam No Retinopathy - -  Foot Form Completion - Done Done        Hyperlipidemia LDL goal <100 Hyperlipidemia:Low fat diet discussed and encouraged.   Lipid Panel  Lab Results  Component Value Date   CHOL 193 03/02/2019   HDL 50 03/02/2019   LDLCALC 124 (H) 03/02/2019   TRIG 93 03/02/2019   CHOLHDL 3.9 03/02/2019    not at goal. Needs to lower fat intake  Updated lab needed at/ before next visit.     Overweight (BMI 25.0-29.9)  Patient re-educated about  the importance of commitment to a  minimum of 150 minutes of exercise per week as able.  The importance of healthy food choices with portion control discussed, as well as eating regularly and within a 12 hour window most days. The need to choose "clean , green" food 50 to 75% of the time is discussed, as well as to make water the primary drink and set a goal of 64 ounces water daily.    Weight /BMI 06/21/2019 05/09/2019 05/08/2019  WEIGHT 135 lb 132 lb 4.8 oz 134 lb  HEIGHT 4\' 11"  4\' 11"   4\' 11"   BMI 27.27 kg/m2 26.72 kg/m2 27.06 kg/m2      Herpes simplex virus (HSV) infection Maintained on antiviral to reduce flares, in lighjt of chronic steroid  Rheumatoid arthritis with rheumatoid factor of multiple sites without organ or systems involvement (HCC) Maintained on chronic prednisone

## 2019-06-22 NOTE — Assessment & Plan Note (Signed)
Maintained on antiviral to reduce flares, in lighjt of chronic steroid

## 2019-06-22 NOTE — Telephone Encounter (Signed)
Last Visit: 05/02/2019 °Next Visit: 10/03/2019 °  °Current Dose per office note 05/02/2019: prednisone 1 mg 4 tablets daily °  °Okay to refill per Dr. Deveshwar  °

## 2019-06-22 NOTE — Assessment & Plan Note (Signed)
  Patient re-educated about  the importance of commitment to a  minimum of 150 minutes of exercise per week as able.  The importance of healthy food choices with portion control discussed, as well as eating regularly and within a 12 hour window most days. The need to choose "clean , green" food 50 to 75% of the time is discussed, as well as to make water the primary drink and set a goal of 64 ounces water daily.    Weight /BMI 06/21/2019 05/09/2019 05/08/2019  WEIGHT 135 lb 132 lb 4.8 oz 134 lb  HEIGHT 4\' 11"  4\' 11"  4\' 11"   BMI 27.27 kg/m2 26.72 kg/m2 27.06 kg/m2

## 2019-06-22 NOTE — Assessment & Plan Note (Signed)
Nizoral twice weekly

## 2019-06-22 NOTE — Assessment & Plan Note (Signed)
Hyperlipidemia:Low fat diet discussed and encouraged.   Lipid Panel  Lab Results  Component Value Date   CHOL 193 03/02/2019   HDL 50 03/02/2019   LDLCALC 124 (H) 03/02/2019   TRIG 93 03/02/2019   CHOLHDL 3.9 03/02/2019    not at goal. Needs to lower fat intake  Updated lab needed at/ before next visit.

## 2019-06-22 NOTE — Assessment & Plan Note (Signed)
Stacy Meyers is reminded of the importance of commitment to daily physical activity for 30 minutes or more, as able and the need to limit carbohydrate intake to 30 to 60 grams per meal to help with blood sugar control.   The need to take medication as prescribed, test blood sugar as directed, and to call between visits if there is a concern that blood sugar is uncontrolled is also discussed.   Stacy Meyers is reminded of the importance of daily foot exam, annual eye examination, and good blood sugar, blood pressure and cholesterol control.  Diabetic Labs Latest Ref Rng & Units 05/09/2019 04/06/2019 04/05/2019 04/04/2019 04/03/2019  HbA1c <5.7 % of total Hgb - - - - -  Microalbumin mg/dL - - - - -  Micro/Creat Ratio <30 mcg/mg creat - - - - -  Chol <200 mg/dL - - - - -  HDL > OR = 50 mg/dL - - - - -  Calc LDL mg/dL (calc) - - - - -  Triglycerides <150 mg/dL - - - - -  Creatinine 0.44 - 1.00 mg/dL 1.00 0.90 1.04(H) 1.30(H) 1.86(H)   BP/Weight 06/21/2019 05/09/2019 05/08/2019 05/02/2019 04/12/2019 04/06/2019 7/90/2409  Systolic BP 735 329 924 268 341 962 -  Diastolic BP 72 61 67 73 67 79 -  Wt. (Lbs) 135 132.3 134 131 137.6 - 149.3  BMI 27.27 26.72 27.06 26.46 27.79 - 30.15   Foot/eye exam completion dates Latest Ref Rng & Units 05/08/2019 12/07/2018  Eye Exam No Retinopathy - -  Foot Form Completion - Done Done

## 2019-06-22 NOTE — Assessment & Plan Note (Signed)
Stacy Meyers is reminded of the importance of commitment to daily physical activity for 30 minutes or more, as able and the need to limit carbohydrate intake to 30 to 60 grams per meal to help with blood sugar control.      Stacy Meyers is reminded of the importance of daily foot exam, annual eye examination, and good blood sugar, blood pressure and cholesterol control.  Diabetic Labs Latest Ref Rng & Units 05/09/2019 04/06/2019 04/05/2019 04/04/2019 04/03/2019  HbA1c <5.7 % of total Hgb - - - - -  Microalbumin mg/dL - - - - -  Micro/Creat Ratio <30 mcg/mg creat - - - - -  Chol <200 mg/dL - - - - -  HDL > OR = 50 mg/dL - - - - -  Calc LDL mg/dL (calc) - - - - -  Triglycerides <150 mg/dL - - - - -  Creatinine 0.44 - 1.00 mg/dL 1.00 0.90 1.04(H) 1.30(H) 1.86(H)   BP/Weight 06/21/2019 05/09/2019 05/08/2019 05/02/2019 04/12/2019 04/06/2019 3/53/2992  Systolic BP 426 834 196 222 979 892 -  Diastolic BP 72 61 67 73 67 79 -  Wt. (Lbs) 135 132.3 134 131 137.6 - 149.3  BMI 27.27 26.72 27.06 26.46 27.79 - 30.15   Foot/eye exam completion dates Latest Ref Rng & Units 05/08/2019 12/07/2018  Eye Exam No Retinopathy - -  Foot Form Completion - Done Done

## 2019-06-22 NOTE — Assessment & Plan Note (Signed)
Controlled, no change in medication DASH diet and commitment to daily physical activity for a minimum of 30 minutes discussed and encouraged, as a part of hypertension management. The importance of attaining a healthy weight is also discussed.  BP/Weight 06/21/2019 05/09/2019 05/08/2019 05/02/2019 04/12/2019 04/06/2019 0/96/0454  Systolic BP 098 119 147 829 562 130 -  Diastolic BP 72 61 67 73 67 79 -  Wt. (Lbs) 135 132.3 134 131 137.6 - 149.3  BMI 27.27 26.72 27.06 26.46 27.79 - 30.15

## 2019-06-22 NOTE — Assessment & Plan Note (Signed)
Maintained on chronic prednisone

## 2019-07-04 ENCOUNTER — Ambulatory Visit (HOSPITAL_COMMUNITY)
Admission: RE | Admit: 2019-07-04 | Discharge: 2019-07-04 | Disposition: A | Discharge status: Home / Self Care | Payer: Medicare HMO | Source: Ambulatory Visit | Attending: Family Medicine | Admitting: Family Medicine

## 2019-07-04 ENCOUNTER — Other Ambulatory Visit: Payer: Self-pay

## 2019-07-04 DIAGNOSIS — Z87898 Personal history of other specified conditions: Secondary | ICD-10-CM

## 2019-07-04 DIAGNOSIS — R921 Mammographic calcification found on diagnostic imaging of breast: Secondary | ICD-10-CM | POA: Insufficient documentation

## 2019-07-17 ENCOUNTER — Other Ambulatory Visit: Payer: Self-pay

## 2019-07-17 ENCOUNTER — Telehealth (INDEPENDENT_AMBULATORY_CARE_PROVIDER_SITE_OTHER): Payer: Medicare HMO

## 2019-07-17 VITALS — BP 116/72 | Ht 59.0 in | Wt 134.0 lb

## 2019-07-17 DIAGNOSIS — Z1159 Encounter for screening for other viral diseases: Secondary | ICD-10-CM

## 2019-07-17 DIAGNOSIS — Z780 Asymptomatic menopausal state: Secondary | ICD-10-CM

## 2019-07-17 DIAGNOSIS — Z0000 Encounter for general adult medical examination without abnormal findings: Secondary | ICD-10-CM

## 2019-07-17 NOTE — Patient Instructions (Signed)
[name] , Thank you for taking time to come for your Medicare Wellness Visit. I appreciate your ongoing commitment to your health goals. Please review the following plan we discussed and let me know if I can assist you in the future.   Screening recommendations/referrals: Colonoscopy: no longer recommended Mammogram: up to date Bone Density: ordered and scheduled 07/31/19 8:45 am. Recommended yearly ophthalmology/optometry visit for glaucoma screening and checkup Recommended yearly dental visit for hygiene and checkup  Vaccinations: Influenza vaccine: up to date Pneumococcal vaccine: up to date Tdap vaccine: up to date Shingles vaccine: does not qualify/contraindicted     Advanced directives: copy needed    Next appointment: wellness in 1 year   Preventive Care 51 Years and Older, Female Preventive care refers to lifestyle choices and visits with your health care provider that can promote health and wellness. What does preventive care include?  A yearly physical exam. This is also called an annual well check.  Dental exams once or twice a year.  Routine eye exams. Ask your health care provider how often you should have your eyes checked.  Personal lifestyle choices, including:  Daily care of your teeth and gums.  Regular physical activity.  Eating a healthy diet.  Avoiding tobacco and drug use.  Limiting alcohol use.  Practicing safe sex.  Taking low-dose aspirin every day.  Taking vitamin and mineral supplements as recommended by your health care provider. What happens during an annual well check? The services and screenings done by your health care provider during your annual well check will depend on your age, overall health, lifestyle risk factors, and family history of disease. Counseling  Your health care provider may ask you questions about your:  Alcohol use.  Tobacco use.  Drug use.  Emotional well-being.  Home and relationship  well-being.  Sexual activity.  Eating habits.  History of falls.  Memory and ability to understand (cognition).  Work and work Statistician.  Reproductive health. Screening  You may have the following tests or measurements:  Height, weight, and BMI.  Blood pressure.  Lipid and cholesterol levels. These may be checked every 5 years, or more frequently if you are over 61 years old.  Skin check.  Lung cancer screening. You may have this screening every year starting at age 21 if you have a 30-pack-year history of smoking and currently smoke or have quit within the past 15 years.  Fecal occult blood test (FOBT) of the stool. You may have this test every year starting at age 41.  Flexible sigmoidoscopy or colonoscopy. You may have a sigmoidoscopy every 5 years or a colonoscopy every 10 years starting at age 87.  Hepatitis C blood test.  Hepatitis B blood test.  Sexually transmitted disease (STD) testing.  Diabetes screening. This is done by checking your blood sugar (glucose) after you have not eaten for a while (fasting). You may have this done every 1-3 years.  Bone density scan. This is done to screen for osteoporosis. You may have this done starting at age 46.  Mammogram. This may be done every 1-2 years. Talk to your health care provider about how often you should have regular mammograms. Talk with your health care provider about your test results, treatment options, and if necessary, the need for more tests. Vaccines  Your health care provider may recommend certain vaccines, such as:  Influenza vaccine. This is recommended every year.  Tetanus, diphtheria, and acellular pertussis (Tdap, Td) vaccine. You may need a Td booster  every 10 years.  Zoster vaccine. You may need this after age 22.  Pneumococcal 13-valent conjugate (PCV13) vaccine. One dose is recommended after age 14.  Pneumococcal polysaccharide (PPSV23) vaccine. One dose is recommended after age  75. Talk to your health care provider about which screenings and vaccines you need and how often you need them. This information is not intended to replace advice given to you by your health care provider. Make sure you discuss any questions you have with your health care provider. Document Released: 01/18/2015 Document Revised: 09/11/2015 Document Reviewed: 10/23/2014 Elsevier Interactive Patient Education  2017 [place] Prevention in the Home Falls can cause injuries. They can happen to people of all ages. There are many things you can do to make your home safe and to help prevent falls. What can I do on the outside of my home?  Regularly fix the edges of walkways and driveways and fix any cracks.  Remove anything that might make you trip as you walk through a door, such as a raised step or threshold.  Trim any bushes or trees on the path to your home.  Use bright outdoor lighting.  Clear any walking paths of anything that might make someone trip, such as rocks or tools.  Regularly check to see if handrails are loose or broken. Make sure that both sides of any steps have handrails.  Any raised decks and porches should have guardrails on the edges.  Have any leaves, snow, or ice cleared regularly.  Use sand or salt on walking paths during winter.  Clean up any spills in your garage right away. This includes oil or grease spills. What can I do in the bathroom?  Use night lights.  Install grab bars by the toilet and in the tub and shower. Do not use towel bars as grab bars.  Use non-skid mats or decals in the tub or shower.  If you need to sit down in the shower, use a plastic, non-slip stool.  Keep the floor dry. Clean up any water that spills on the floor as soon as it happens.  Remove soap buildup in the tub or shower regularly.  Attach bath mats securely with double-sided non-slip rug tape.  Do not have throw rugs and other things on the floor that can make  you trip. What can I do in the bedroom?  Use night lights.  Make sure that you have a light by your bed that is easy to reach.  Do not use any sheets or blankets that are too big for your bed. They should not hang down onto the floor.  Have a firm chair that has side arms. You can use this for support while you get dressed.  Do not have throw rugs and other things on the floor that can make you trip. What can I do in the kitchen?  Clean up any spills right away.  Avoid walking on wet floors.  Keep items that you use a lot in easy-to-reach places.  If you need to reach something above you, use a strong step stool that has a grab bar.  Keep electrical cords out of the way.  Do not use floor polish or wax that makes floors slippery. If you must use wax, use non-skid floor wax.  Do not have throw rugs and other things on the floor that can make you trip. What can I do with my stairs?  Do not leave any items on the stairs.  Make  sure that there are handrails on both sides of the stairs and use them. Fix handrails that are broken or loose. Make sure that handrails are as long as the stairways.  Check any carpeting to make sure that it is firmly attached to the stairs. Fix any carpet that is loose or worn.  Avoid having throw rugs at the top or bottom of the stairs. If you do have throw rugs, attach them to the floor with carpet tape.  Make sure that you have a light switch at the top of the stairs and the bottom of the stairs. If you do not have them, ask someone to add them for you. What else can I do to help prevent falls?  Wear shoes that:  Do not have high heels.  Have rubber bottoms.  Are comfortable and fit you well.  Are closed at the toe. Do not wear sandals.  If you use a stepladder:  Make sure that it is fully opened. Do not climb a closed stepladder.  Make sure that both sides of the stepladder are locked into place.  Ask someone to hold it for you, if  possible.  Clearly mark and make sure that you can see:  Any grab bars or handrails.  First and last steps.  Where the edge of each step is.  Use tools that help you move around (mobility aids) if they are needed. These include:  Canes.  Walkers.  Scooters.  Crutches.  Turn on the lights when you go into a dark area. Replace any light bulbs as soon as they burn out.  Set up your furniture so you have a clear path. Avoid moving your furniture around.  If any of your floors are uneven, fix them.  If there are any pets around you, be aware of where they are.  Review your medicines with your doctor. Some medicines can make you feel dizzy. This can increase your chance of falling. Ask your doctor what other things that you can do to help prevent falls. This information is not intended to replace advice given to you by your health care provider. Make sure you discuss any questions you have with your health care provider. Document Released: 10/18/2008 Document Revised: 05/30/2015 Document Reviewed: 01/26/2014 Elsevier Interactive Patient Education  2017 [employer].

## 2019-07-17 NOTE — Addendum Note (Signed)
Addended by: Eual Fines on: 07/17/2019 10:26 AM   Modules accepted: Orders

## 2019-07-17 NOTE — Progress Notes (Addendum)
Subjective:   Stacy Meyers is a 86 y.o. female who presents for Medicare Annual (Subsequent) preventive examination.  Review of Systems     Cardiac Risk Factors include: advanced age (>71men, >85 women);diabetes mellitus;dyslipidemia;hypertension     Objective:    Today's Vitals   07/17/19 0923 07/17/19 0945  BP: 116/72   Weight: 134 lb (60.8 kg)   Height: 4\' 11"  (1.499 m)   PainSc:  0-No pain   Body mass index is 27.06 kg/m.  Advanced Directives 07/17/2019 05/09/2019 04/03/2019 04/03/2019 05/24/2017 05/06/2016 06/02/2012  Does Patient Have a Medical Advance Directive? Yes No Yes Yes Yes Yes Patient does not have advance directive;Patient would not like information  Type of Advance Directive Healthcare Power of [place];Living will [place];Living will Living will [employer];Living will -  Does patient want to make changes to medical advance directive? - - No - Patient declined - No - Patient declined No - Patient declined -  Copy of [place] in Chart? No - copy requested - No - copy requested - - No - copy requested -  Would patient like information on creating a medical advance directive? - No - Patient declined - - - - -  Pre-existing out of facility DNR order (yellow form or pink MOST form) - - - - - - No    Current Medications (verified) Outpatient Encounter Medications as of 07/17/2019  Medication Sig  . acetaminophen (TYLENOL) 500 MG tablet Take 1,000 mg by mouth every 6 (six) hours as needed (for pain).   Marland Kitchen acyclovir (ZOVIRAX) 400 MG tablet TAKE 1 TABLET BY MOUTH TWICE A DAY (Patient taking differently: Take 400 mg by mouth 2 (two) times daily. )  . alendronate (FOSAMAX) 70 MG tablet TAKE 1 TABLET BY MOUTH EVERY 7 DAYS. TAKE WITH A FULL GLASS OF WATER ON AN EMPTY STOMACH. (Patient taking differently: Take 70 mg by mouth every 7 (seven) days. )  . amLODipine (NORVASC) 5 MG tablet TAKE 1  TABLET BY MOUTH EVERY DAY  . azelastine (ASTELIN) 0.1 % nasal spray Place 2 sprays into both nostrils 2 (two) times daily. Use in each nostril as directed (Patient taking differently: Place 2 sprays into both nostrils as needed for allergies. )  . ELDERBERRY PO Take 1 capsule by mouth daily. Gummy with vitamin c and zinc  . folic acid (FOLVITE) 1 MG tablet Take 1 tablet (1 mg total) by mouth daily.  Marland Kitchen KLOR-CON M20 20 MEQ tablet TAKE 1 TABLET (20 MEQ TOTAL) BY MOUTH DAILY.  . methotrexate (RHEUMATREX) 2.5 MG tablet TAKE 3 TABLETS BY MOUTH ONCE A WEEK. TAKE AS DIRECTED CAUTION:CHEMOTHERAPY. PROTECT FROM LIGHT  . montelukast (SINGULAIR) 10 MG tablet TAKE 1 TABLET BY MOUTH EVERYDAY AT BEDTIME (Patient taking differently: Take 10 mg by mouth at bedtime. )  . predniSONE (DELTASONE) 1 MG tablet TAKE 4 TABLETS (4 MG TOTAL) BY MOUTH DAILY WITH BREAKFAST.  Marland Kitchen Propylene Glycol (SYSTANE BALANCE OP) Place 1 drop into both eyes daily as needed (for dry eyes).   . rosuvastatin (CRESTOR) 5 MG tablet TAKE 1 TABLET BY MOUTH EVERY OTHER DAY  . UNABLE TO FIND Diabetic shoes x 1 inserts x 3  Dx e11.9  . UNABLE TO FIND Diabetic Shoes x1 pair inserts x3 pair Dx code E11.9   No facility-administered encounter medications on file as of 07/17/2019.    Allergies (verified) Aspirin and Sudafed [pseudoephedrine hcl]   History:  Past Medical History:  Diagnosis Date  . Acute cystitis with hematuria 04/11/2011  . Anemia   . ARF (acute renal failure) ([place]) 12/14/2010  . CKD (chronic kidney disease), stage II    previously seen by nephrologist--- dr Florene Glen,  pt released per lov in epic 09-24-2017  . Diverticulosis of colon   . Essential hypertension, benign    followed by pcp   (pt had nuclear stress test (in epic under media) dated 05-25-2008 showed normal perfusion w/ no evidence ischemia, ef 83%  . History of bradycardia    previously seen by cardiologist, dr branch, pt released per lov 08-11-2018 in epic, bradycardia  resolved  . History of sepsis    04-03-2019 hospital admission w/ urosepsis due to obstructive uropathy due to right ureter stone  . Hypokalemia   . Lipoma of colon    ascending  . Lumbar spondylosis   . Mixed hyperlipidemia   . Nephrolithiasis    per CT 04-03-2019 nonobstructive bilateral stones  . Ocular herpes   . Osteoarthritis    followed by dr deveshwar--- hands, shoulders, feet  . Osteoporosis   . Rheumatoid arthritis(714.0)    rheumotologist-- dr s. Estanislado Pandy---  multiple sites, treated with methotrexate/ prednisone  . Right nephrolithiasis-1.4cm Moderate right-sided urinary tract obstruction/hydronephrosis 04/03/2019  . Right ureteral calculus   . Seasonal allergies   . Sepsis ([place]) 04/03/2019  . Sepsis secondary to UTI /Nephrolithiasis with obstructive uropathy 04/03/2019  . SUI (stress urinary incontinence, female)   . Uterine fibroid    Past Surgical History:  Procedure Laterality Date  . BUNIONECTOMY Right 2012  . CATARACT EXTRACTION W/ INTRAOCULAR LENS  IMPLANT, BILATERAL  2009  . CHOLECYSTECTOMY  12/17/2010   Procedure: LAPAROSCOPIC CHOLECYSTECTOMY;  Surgeon: Donato Heinz;  Location: [facility];  Service: General;  Laterality: N/A;  . COLONOSCOPY  05/22/2011   Procedure: COLONOSCOPY;  Surgeon: Rogene Houston, MD;  Location: [facility];  Service: Endoscopy;  Laterality: N/A;  1045  . CYSTOSCOPY WITH RETROGRADE PYELOGRAM, URETEROSCOPY AND STENT PLACEMENT Right 05/09/2019   Procedure: CYSTOSCOPY WITH RETROGRADE PYELOGRAM, URETEROSCOPY AND STENT PLACEMENT;  Surgeon: Robley Fries, MD;  Location: [facility];  Service: Urology;  Laterality: Right;  1 HR  . CYSTOSCOPY WITH STENT PLACEMENT Right 04/03/2019   Procedure: CYSTOSCOPY, RIGHT RETROGRADE PYELOGRAM  WITH STENT PLACEMENT;  Surgeon: Robley Fries, MD;  Location: [facility];  Service: Urology;  Laterality: Right;  . EXCISION BONE CYST Right 2010   RIGHT SHOULDER  . GANGLION CYST EXCISION Left  06/02/2012   Procedure: Excision ganglion left dorsal wrist;  Surgeon: Sanjuana Kava, MD;  Location: [facility];  Service: Orthopedics;  Laterality: Left;  . HOLMIUM LASER APPLICATION Right 01/10/1094   Procedure: HOLMIUM LASER APPLICATION;  Surgeon: Robley Fries, MD;  Location: [facility];  Service: Urology;  Laterality: Right;  . LUMBAR MICRODISCECTOMY  07-16-2000   @[facility]    L 2  -3  . POSTERIOR LAMINECTOMY / DECOMPRESSION LUMBAR SPINE  06-26-2009   @[facility]    w/ LATERAL MASS FUSION , L3 -- 5  . SHOULDER ARTHROSCOPY WITH DISTAL CLAVICLE RESECTION Right 01-10-2009   @[facility]    w/ DEBRIDEMENT AND EXCISION RECURRENT GANGLION CYST   Family History  Problem Relation Age of Onset  . Stroke Mother   . Cancer Father   . Diabetes Sister   . Diabetes Sister   . Kidney failure Brother   . Kidney failure Brother   . Pancreatic cancer Sister   .  Kidney Stones Sister        Only one Kidney  . Arthritis Sister   . Heart disease Daughter    Social History   Socioeconomic History  . Marital status: Widowed    Spouse name: Not on file  . Number of children: 1  . Years of education: Not on file  . Highest education level: Not on file  Occupational History  . Occupation: Retired    Comment: [employer]  Tobacco Use  . Smoking status: Never Smoker  . Smokeless tobacco: Never Used  Vaping Use  . Vaping Use: Never used  Substance and Sexual Activity  . Alcohol use: No  . Drug use: Never  . Sexual activity: Not Currently    Birth control/protection: Post-menopausal  Other Topics Concern  . Not on file  Social History Narrative   Husband passed away Apr 16, 2016. It has been financially harder to pay the bills since his passing, but she is making it. Has grandchildren around which makes it easier.    Social Determinants of Health   Financial Resource Strain: Low Risk   . Difficulty of Paying Living Expenses: Not hard at all  Food Insecurity: No Food Insecurity  . Worried About  Charity fundraiser in the Last Year: Never true  . Ran Out of Food in the Last Year: Never true  Transportation Needs: No Transportation Needs  . Lack of Transportation (Medical): No  . Lack of Transportation (Non-Medical): No  Physical Activity: Insufficiently Active  . Days of Exercise per Week: 3 days  . Minutes of Exercise per Session: 30 min  Stress: No Stress Concern Present  . Feeling of Stress : Not at all  Social Connections: Moderately Integrated  . Frequency of Communication with Friends and Family: More than three times a week  . Frequency of Social Gatherings with Friends and Family: More than three times a week  . Attends Religious Services: More than 4 times per year  . Active Member of Clubs or Organizations: Yes  . Attends Archivist Meetings: 1 to 4 times per year  . Marital Status: Widowed    Tobacco Counseling Counseling given: Not Answered   Clinical Intake:  Pre-visit preparation completed: Yes  Pain : No/denies pain Pain Score: 0-No pain     Nutritional Status: BMI 25 -29 Overweight Diabetes: Yes CBG done?: No Did pt. bring in CBG monitor from home?: No  How often do you need to have someone help you when you read instructions, pamphlets, or other written materials from your doctor or pharmacy?: 2 - Rarely What is the last grade level you completed in school?: diploma  Diabetic? no  Interpreter Needed?: No  Information entered by :: Elliyah Liszewski lpn   Activities of Daily Living In your present state of health, do you have any difficulty performing the following activities: 07/17/2019 05/09/2019  Hearing? N N  Vision? N N  Difficulty concentrating or making decisions? N N  Walking or climbing stairs? N N  Dressing or bathing? N -  Doing errands, shopping? N -  Preparing Food and eating ? N -  Using the Toilet? N -  In the past six months, have you accidently leaked urine? N -  Do you have problems with loss of bowel control? N -    Managing your Medications? N -  Managing your Finances? N -  Comment granddaughter helps out -  Housekeeping or managing your Housekeeping? Y -  Comment granddaughter helps  her -  Some recent data might be hidden    Patient Care Team: Fayrene Helper, MD as PCP - General Branch, Alphonse Guild, MD as PCP - Cardiology (Cardiology) Estanislado Emms, MD as Consulting Physician (Nephrology) Bo Merino, MD as Consulting Physician (Rheumatology)  Indicate any recent Medical Services you may have received from other than [facility] providers in the past year (date may be approximate).     Assessment:   This is a routine wellness examination for Channie.  Hearing/Vision screen No exam data present. Has appt with [facility] in Nov- earliest appt available  Dietary issues and exercise activities discussed: Current Exercise Habits: Home exercise routine, Type of exercise: stretching, Time (Minutes): 30, Frequency (Times/Week): 3, Weekly Exercise (Minutes/Week): 90, Intensity: Mild, Exercise limited by: None identified  Goals    . DIET - INCREASE WATER INTAKE     8 glasses per day from 5    . Exercise 3x per week (30 min per time)     Recommend starting a routine exercise program at least 3 days a week for 30-45 minutes at a time as tolerated. Does chair exercises 2-3 times a week for 30 mins    . Prevent falls      Depression Screen PHQ 2/9 Scores 07/17/2019 07/17/2019 05/08/2019 04/12/2019 05/27/2018 01/17/2018 08/16/2017  PHQ - 2 Score 0 0 0 0 0 2 0  PHQ- 9 Score - - 0 - - 4 -    Fall Risk Fall Risk  07/17/2019 06/21/2019 05/08/2019 04/12/2019 02/07/2019  Falls in the past year? 0 0 1 1 0  Comment - - - - -  Number falls in past yr: 0 0 0 0 0  Injury with Fall? 0 0 0 0 0  Risk for fall due to : - - Impaired balance/gait;History of fall(s) - -  Follow up - - Falls evaluation completed;Education provided Falls evaluation completed -  Comment - - - - -    Any stairs in or around the home? Yes   If so, are there any without handrails? Yes  Home free of loose throw rugs in walkways, pet beds, electrical cords, etc? Yes  Adequate lighting in your home to reduce risk of falls? Yes   ASSISTIVE DEVICES UTILIZED TO PREVENT FALLS:  Life alert? No  Use of a cane, walker or w/c? Yes uses cane Grab bars in the bathroom? Yes  Shower chair or bench in shower? Yes  Elevated toilet seat or a handicapped toilet? No   TIMED UP AND GO:  Was the test performed? No .  Length of time to ambulate 10 feet: not done due to virtual visit     Cognitive Function:     6CIT Screen 07/17/2019 05/27/2018 05/24/2017 05/06/2016  What Year? 0 points 0 points 0 points 0 points  What month? 0 points 0 points 0 points 0 points  What time? 0 points 0 points 0 points 0 points  Count back from 20 0 points 0 points 0 points 0 points  Months in reverse 2 points 2 points 0 points 0 points  Repeat phrase 0 points 0 points 0 points 0 points  Total Score 2 2 0 0    Immunizations Immunization History  Administered Date(s) Administered  . Influenza Split 11/07/2013  . Influenza Whole 10/28/2009, 09/17/2010  . Influenza, High Dose Seasonal PF 09/29/2016, 09/15/2018  . Influenza,inj,Quad PF,6+ Mos 09/03/2014  . Influenza-Unspecified 10/05/2013, 09/29/2016  . Moderna SARS-COVID-2 Vaccination 01/17/2019, 02/27/2019  . PPD Test 05/14/2014,  05/28/2015  . Pneumococcal Conjugate-13 08/03/2013  . Pneumococcal Polysaccharide-23 08/28/2009  . Tdap 04/29/2010    TDAP status: Up to date Flu Vaccine status: Up to date Pneumococcal vaccine status: Up to date Covid-19 vaccine status: Completed vaccines  Qualifies for Shingles Vaccine? Yes   Zostavax completed No   Shingrix Completed?: No.    Education has been provided regarding the importance of this vaccine. Patient has been advised to call insurance company to determine out of pocket expense if they have not yet received this vaccine. Advised may also receive  vaccine at local pharmacy or Health Dept. Verbalized acceptance and understanding. Cannot take due to medication she is on   Screening Tests Health Maintenance  Topic Date Due  . OPHTHALMOLOGY EXAM  11/30/2018  . INFLUENZA VACCINE  08/06/2019  . HEMOGLOBIN A1C  08/30/2019  . URINE MICROALBUMIN  02/01/2020  . TETANUS/TDAP  04/28/2020  . FOOT EXAM  05/07/2020  . DEXA SCAN  Completed  . COVID-19 Vaccine  Completed  . PNA vac Low Risk Adult  Completed    Health Maintenance  Health Maintenance Due  Topic Date Due  . OPHTHALMOLOGY EXAM  11/30/2018    Colorectal cancer screening: No longer required.  Mammogram status: Completed yes. Repeat every year Bone Density status: Ordered 07/17/19. Pt provided with contact info and advised to call to schedule appt.  Lung Cancer Screening: (Low Dose CT Chest recommended if Age 67-80 years, 30 pack-year currently smoking OR have quit w/in 15years.) does not qualify.   Lung Cancer Screening Referral: no  Additional Screening:  Hepatitis C Screening: does qualify; ordered  Vision Screening: Recommended annual ophthalmology exams for early detection of glaucoma and other disorders of the eye. Is the patient up to date with their annual eye exam?  no. Has appt in nov Who is the provider or what is the name of the office in which the patient attends annual eye exams? Dr Jorja Loa If pt is not established with a provider, would they like to be referred to a provider to establish care? No .   Dental Screening: Recommended annual dental exams for proper oral hygiene  Community Resource Referral / Chronic Care Management: CRR required this visit?  No   CCM required this visit?  No      Plan:     I have personally reviewed and noted the following in the patient's chart:   . Medical and social history . Use of alcohol, tobacco or illicit drugs  . Current medications and supplements . Functional ability and status . Nutritional  status . Physical activity . Advanced directives . List of other physicians . Hospitalizations, surgeries, and ER visits in previous 12 months . Vitals . Screenings to include cognitive, depression, and falls . Referrals and appointments  In addition, I have reviewed and discussed with patient certain preventive protocols, quality metrics, and best practice recommendations. A written personalized care plan for preventive services as well as general preventive health recommendations were provided to patient.     Kate Sable, LPN, LPN   [id number]   Nurse Notes: Telephone visit. Patient was at home. Provider in the office. Time spent with patient- 30 mins

## 2019-07-31 ENCOUNTER — Ambulatory Visit (HOSPITAL_COMMUNITY)
Admission: RE | Admit: 2019-07-31 | Discharge: 2019-07-31 | Disposition: A | Discharge status: Home / Self Care | Payer: Medicare HMO | Source: Ambulatory Visit | Attending: Family Medicine | Admitting: Family Medicine

## 2019-07-31 ENCOUNTER — Other Ambulatory Visit: Payer: Self-pay | Admitting: Family Medicine

## 2019-07-31 ENCOUNTER — Other Ambulatory Visit: Payer: Self-pay

## 2019-07-31 DIAGNOSIS — R29890 Loss of height: Secondary | ICD-10-CM | POA: Diagnosis not present

## 2019-07-31 DIAGNOSIS — Z780 Asymptomatic menopausal state: Secondary | ICD-10-CM

## 2019-07-31 DIAGNOSIS — R634 Abnormal weight loss: Secondary | ICD-10-CM | POA: Diagnosis not present

## 2019-08-05 ENCOUNTER — Other Ambulatory Visit: Payer: Self-pay | Admitting: Family Medicine

## 2019-08-07 ENCOUNTER — Other Ambulatory Visit: Payer: Self-pay | Admitting: Family Medicine

## 2019-08-07 DIAGNOSIS — M818 Other osteoporosis without current pathological fracture: Secondary | ICD-10-CM

## 2019-08-08 ENCOUNTER — Other Ambulatory Visit: Payer: Self-pay

## 2019-08-08 ENCOUNTER — Encounter: Payer: Self-pay | Admitting: "Endocrinology

## 2019-08-08 ENCOUNTER — Ambulatory Visit: Payer: Medicare HMO | Admitting: "Endocrinology

## 2019-08-08 VITALS — BP 126/54 | HR 60 | Ht 59.0 in | Wt 135.6 lb

## 2019-08-08 DIAGNOSIS — M810 Age-related osteoporosis without current pathological fracture: Secondary | ICD-10-CM

## 2019-08-08 NOTE — Progress Notes (Signed)
08/08/2019        Endocrinology Consulte Note  Past Medical History:  Diagnosis Date  . Acute cystitis with hematuria 04/11/2011  . Anemia   . ARF (acute renal failure) ([place]) 12/14/2010  . CKD (chronic kidney disease), stage II    previously seen by nephrologist--- dr Florene Glen,  pt released per lov in epic 09-24-2017  . Diabetes mellitus, type II ([place])   . Diverticulosis of colon   . Essential hypertension, benign    followed by pcp   (pt had nuclear stress test (in epic under media) dated 05-25-2008 showed normal perfusion w/ no evidence ischemia, ef 83%  . History of bradycardia    previously seen by cardiologist, dr branch, pt released per lov 08-11-2018 in epic, bradycardia resolved  . History of sepsis    04-03-2019 hospital admission w/ urosepsis due to obstructive uropathy due to right ureter stone  . Hypokalemia   . Lipoma of colon    ascending  . Lumbar spondylosis   . Mixed hyperlipidemia   . Nephrolithiasis    per CT 04-03-2019 nonobstructive bilateral stones  . Ocular herpes   . Osteoarthritis    followed by dr deveshwar--- hands, shoulders, feet  . Osteoporosis   . Rheumatoid arthritis(714.0)    rheumotologist-- dr s. Estanislado Pandy---  multiple sites, treated with methotrexate/ prednisone  . Right nephrolithiasis-1.4cm Moderate right-sided urinary tract obstruction/hydronephrosis 04/03/2019  . Right ureteral calculus   . Seasonal allergies   . Sepsis ([place]) 04/03/2019  . Sepsis secondary to UTI /Nephrolithiasis with obstructive uropathy 04/03/2019  . SUI (stress urinary incontinence, female)   . Uterine fibroid    Past Surgical History:  Procedure Laterality Date  . BUNIONECTOMY Right 2012  . CATARACT EXTRACTION W/ INTRAOCULAR LENS  IMPLANT, BILATERAL  2009  . CHOLECYSTECTOMY  12/17/2010   Procedure: LAPAROSCOPIC CHOLECYSTECTOMY;  Surgeon: Donato Heinz;  Location: [facility];  Service: General;  Laterality: N/A;  .  COLONOSCOPY  05/22/2011   Procedure: COLONOSCOPY;  Surgeon: Rogene Houston, MD;  Location: [facility];  Service: Endoscopy;  Laterality: N/A;  1045  . CYSTOSCOPY WITH RETROGRADE PYELOGRAM, URETEROSCOPY AND STENT PLACEMENT Right 05/09/2019   Procedure: CYSTOSCOPY WITH RETROGRADE PYELOGRAM, URETEROSCOPY AND STENT PLACEMENT;  Surgeon: Robley Fries, MD;  Location: [facility];  Service: Urology;  Laterality: Right;  1 HR  . CYSTOSCOPY WITH STENT PLACEMENT Right 04/03/2019   Procedure: CYSTOSCOPY, RIGHT RETROGRADE PYELOGRAM  WITH STENT PLACEMENT;  Surgeon: Robley Fries, MD;  Location: [facility];  Service: Urology;  Laterality: Right;  . EXCISION BONE CYST Right 2010   RIGHT SHOULDER  . GANGLION CYST EXCISION Left 06/02/2012   Procedure: Excision ganglion left dorsal wrist;  Surgeon: Sanjuana Kava, MD;  Location: [facility];  Service: Orthopedics;  Laterality: Left;  . HOLMIUM LASER APPLICATION Right 07/10/2829   Procedure: HOLMIUM LASER APPLICATION;  Surgeon: Robley Fries, MD;  Location: [facility];  Service: Urology;  Laterality: Right;  . LUMBAR MICRODISCECTOMY  07-16-2000   @[facility]    L 2  -3  . POSTERIOR LAMINECTOMY / DECOMPRESSION LUMBAR SPINE  06-26-2009   @[facility]    w/ LATERAL MASS FUSION , L3 -- 5  . SHOULDER ARTHROSCOPY WITH DISTAL CLAVICLE RESECTION Right 01-10-2009   @[facility]    w/ DEBRIDEMENT AND EXCISION RECURRENT GANGLION CYST   Social History   Socioeconomic History  . Marital status: Widowed    Spouse name: Not on file  . Number of children: 1  . Years  of education: Not on file  . Highest education level: Not on file  Occupational History  . Occupation: Retired    Comment: [employer]  Tobacco Use  . Smoking status: Never Smoker  . Smokeless tobacco: Never Used  Vaping Use  . Vaping Use: Never used  Substance and Sexual Activity  . Alcohol use: No  . Drug use: Never  . Sexual activity: Not Currently    Birth control/protection: Post-menopausal   Other Topics Concern  . Not on file  Social History Narrative   Husband passed away 05-08-16. It has been financially harder to pay the bills since his passing, but she is making it. Has grandchildren around which makes it easier.    Social Determinants of Health   Financial Resource Strain: Low Risk   . Difficulty of Paying Living Expenses: Not hard at all  Food Insecurity: No Food Insecurity  . Worried About Charity fundraiser in the Last Year: Never true  . Ran Out of Food in the Last Year: Never true  Transportation Needs: No Transportation Needs  . Lack of Transportation (Medical): No  . Lack of Transportation (Non-Medical): No  Physical Activity: Insufficiently Active  . Days of Exercise per Week: 3 days  . Minutes of Exercise per Session: 30 min  Stress: No Stress Concern Present  . Feeling of Stress : Not at all  Social Connections: Moderately Integrated  . Frequency of Communication with Friends and Family: More than three times a week  . Frequency of Social Gatherings with Friends and Family: More than three times a week  . Attends Religious Services: More than 4 times per year  . Active Member of Clubs or Organizations: Yes  . Attends Archivist Meetings: 1 to 4 times per year  . Marital Status: Widowed   Outpatient Encounter Medications as of 08/08/2019  Medication Sig  . acetaminophen (TYLENOL) 500 MG tablet Take 1,000 mg by mouth every 6 (six) hours as needed (for pain).   Marland Kitchen acyclovir (ZOVIRAX) 400 MG tablet TAKE 1 TABLET BY MOUTH TWICE A DAY (Patient taking differently: Take 400 mg by mouth 2 (two) times daily. )  . alendronate (FOSAMAX) 70 MG tablet TAKE 1 TABLET BY MOUTH EVERY 7 DAYS. TAKE WITH A FULL GLASS OF WATER ON AN EMPTY STOMACH.  Marland Kitchen amLODipine (NORVASC) 5 MG tablet TAKE 1 TABLET BY MOUTH EVERY DAY  . azelastine (ASTELIN) 0.1 % nasal spray Place 2 sprays into both nostrils 2 (two) times daily. Use in each nostril as directed (Patient taking  differently: Place 2 sprays into both nostrils as needed for allergies. )  . ELDERBERRY PO Take 1 capsule by mouth daily. Gummy with vitamin c and zinc  . folic acid (FOLVITE) 1 MG tablet Take 1 tablet (1 mg total) by mouth daily.  Marland Kitchen KLOR-CON M20 20 MEQ tablet TAKE 1 TABLET (20 MEQ TOTAL) BY MOUTH DAILY.  . methotrexate (RHEUMATREX) 2.5 MG tablet TAKE 3 TABLETS BY MOUTH ONCE A WEEK. TAKE AS DIRECTED CAUTION:CHEMOTHERAPY. PROTECT FROM LIGHT  . montelukast (SINGULAIR) 10 MG tablet TAKE 1 TABLET BY MOUTH EVERYDAY AT BEDTIME  . predniSONE (DELTASONE) 1 MG tablet TAKE 4 TABLETS (4 MG TOTAL) BY MOUTH DAILY WITH BREAKFAST.  Marland Kitchen Propylene Glycol (SYSTANE BALANCE OP) Place 1 drop into both eyes daily as needed (for dry eyes).   . rosuvastatin (CRESTOR) 5 MG tablet TAKE 1 TABLET BY MOUTH EVERY OTHER DAY  . UNABLE TO FIND Diabetic shoes x 1 inserts  x 3  Dx e11.9  . UNABLE TO FIND Diabetic Shoes x1 pair inserts x3 pair Dx code E11.9   No facility-administered encounter medications on file as of 08/08/2019.   ALLERGIES: Allergies  Allergen Reactions  . Aspirin Other (See Comments)    REACTION: burns stomach (uncoated)  . Sudafed [Pseudoephedrine Hcl] Other (See Comments)    Increased joint pain    VACCINATION STATUS: Immunization History  Administered Date(s) Administered  . Influenza Split 11/07/2013  . Influenza Whole 10/28/2009, 09/17/2010  . Influenza, High Dose Seasonal PF 09/29/2016, 09/15/2018  . Influenza,inj,Quad PF,6+ Mos 09/03/2014  . Influenza-Unspecified 10/05/2013, 09/29/2016  . Moderna SARS-COVID-2 Vaccination 01/17/2019, 02/27/2019  . PPD Test 05/14/2014, 05/28/2015  . Pneumococcal Conjugate-13 08/03/2013  . Pneumococcal Polysaccharide-23 08/28/2009  . Tdap 04/29/2010     HPI   Stacy Meyers is 86 y.o. female who presents today with a medical history as above. she is being seen in consultation for osteoporosis requested by Fayrene Helper, MD.  Patient was diagnosed  with osteoporosis at approximate age of 77 years.   She denies fractures or falls. No dizziness/vertigo/orthostasis. -She is accompanied by her granddaughter who elaborates on the story.  She was treated with Fosamax for at least 5 years.  Review of her last 3 bone density studies show that there has been progressive improvement in her bone density. She also eats dairy and green, leafy, vegetables.   No weight bearing exercises.  -Review of her medical records indicate a course of hypercalcemia between January and September 2020 which has resolved based on her most recent labs.  No h/o hyperparathyroidism.   Lab Results  Component Value Date   PTH 44 11/04/2018   CALCIUM 9.2 04/06/2019   CALCIUM 8.7 (L) 04/05/2019   CALCIUM 8.3 (L) 04/04/2019   CALCIUM 8.3 (L) 04/03/2019   CALCIUM 10.2 03/02/2019   CALCIUM 10.2 11/04/2018   CALCIUM 10.2 11/04/2018   CALCIUM 11.1 (H) 09/27/2018   CALCIUM 10.1 06/22/2018   CALCIUM 11.1 (H) 01/13/2018    No h/o thyrotoxicosis. Reviewed TSH recent levels:  Lab Results  Component Value Date   TSH 1.95 01/13/2018   TSH 2.354 01/24/2015   TSH 1.782 07/31/2013   TSH 0.915 04/16/2011   TSH 0.490 12/15/2010    No h/o CKD. Last BUN/Cr: Lab Results  Component Value Date   BUN 27 (H) 05/09/2019   CREATININE 1.00 05/09/2019    Menopause was at 86 y/o.   -She did not report family history of osteoporosis.  She has well-controlled type 2 diabetes.  Because of her history of rheumatoid arthritis, she is on ongoing methotrexate treatment, and intermittent steroids.  -She reports losing 1 1/2 inches of height over time.  Review of Systems  Constitutional: + steady  weight ,  no fatigue, no subjective hyperthermia, no subjective hypothermia Eyes: no blurry vision, no xerophthalmia ENT: no sore throat, no nodules palpated in throat, no dysphagia/odynophagia, no hoarseness Cardiovascular: no Chest Pain, no Shortness of Breath, no palpitations, no leg  swelling Respiratory: no cough, no SOB Gastrointestinal: no Nausea/Vomiting/Diarhhea Musculoskeletal: no muscle/joint aches, + height loss Skin: no rashes Neurological: no tremors, no numbness, no tingling, no dizziness Psychiatric: no depression, no anxiety  Objective:    BP (!) 126/54   Pulse 60   Ht 4\' 11"  (1.499 m)   Wt 135 lb 9.6 oz (61.5 kg)   BMI 27.39 kg/m   Wt Readings from Last 3 Encounters:  08/08/19 135 lb 9.6 oz (61.5 kg)  07/17/19 134 lb (60.8 kg)  06/21/19 135 lb (61.2 kg)    Physical Exam  Constitutional: + normal  weight for height, not in acute distress, normal state of mind, + walks with a cane. Eyes: PERRLA, EOMI, no exophthalmos ENT: moist mucous membranes, no thyromegaly, no cervical lymphadenopathy Cardiovascular: normal precordial activity, Regular Rate and Rhythm, no Murmur/Rubs/Gallops Respiratory:  adequate breathing efforts, no gross chest deformity, Clear to auscultation bilaterally Gastrointestinal: abdomen soft, Non -tender, No distension, Bowel Sounds present Musculoskeletal:  + Diffuse arthritic changes on bilateral upper extremities, strength intact in all four extremities Skin: moist, warm, no rashes Neurological: no tremor with outstretched hands, Deep tendon reflexes normal in all four extremities.  CMP ( most recent) CMP     Component Value Date/Time   NA 143 05/09/2019 0939   NA 141 09/27/2018 0936   K 4.0 05/09/2019 0939   CL 109 05/09/2019 0939   CO2 21 (L) 04/06/2019 0443   GLUCOSE 106 (H) 05/09/2019 0939   BUN 27 (H) 05/09/2019 0939   BUN 22 09/27/2018 0936   CREATININE 1.00 05/09/2019 0939   CREATININE 0.86 03/02/2019 0800   CALCIUM 9.2 04/06/2019 0443   PROT 5.1 (L) 04/03/2019 0604   PROT 7.2 09/27/2018 0936   ALBUMIN 2.6 (L) 04/03/2019 0604   ALBUMIN 4.6 09/27/2018 0936   AST 21 04/03/2019 0604   ALT 13 04/03/2019 0604   ALKPHOS 83 04/03/2019 0604   BILITOT 0.8 04/03/2019 0604   BILITOT 0.5 09/27/2018 0936    GFRNONAA 58 (L) 04/06/2019 0443   GFRNONAA 62 03/02/2019 0800   GFRAA >60 04/06/2019 0443   GFRAA 71 03/02/2019 0800     Diabetic Labs (most recent): Lab Results  Component Value Date   HGBA1C 6.3 (H) 03/02/2019   HGBA1C 6.3 (H) 12/05/2018   HGBA1C 6.5 (H) 01/13/2018     Lipid Panel ( most recent) Lipid Panel     Component Value Date/Time   CHOL 193 03/02/2019 0805   TRIG 93 03/02/2019 0805   HDL 50 03/02/2019 0805   CHOLHDL 3.9 03/02/2019 0805   VLDL 26 02/24/2016 0832   LDLCALC 124 (H) 03/02/2019 0805      Lab Results  Component Value Date   TSH 1.95 01/13/2018   TSH 2.354 01/24/2015   TSH 1.782 07/31/2013   TSH 0.915 04/16/2011   TSH 0.490 12/15/2010   TSH 1.426 03/03/2010   FREET4 0.97 12/15/2010          Assessment: 1. Osteoporosis  Plan: 1. Osteoporosis - likely postmenopausal    Review of her last 3 bone density studies showed that she is benefiting from treatment with alendronate a T score improving from -3.1 to  -2.6 on the right forearm radius.   We reviewed her DEXA scans together, and I explained that based on the T scores, she has an increased risk for fractures.  - We discussed about the different medication classes, benefits and side effects (including atypical fractures and ONJ - no dental workup in progress or planned).  -She continues to tolerate her Fosamax very well.  She is advised to continue Fosamax 70 mg p.o. weekly. -She is not due for another bone density until July 2023.  If her next bone density shows inadequate response, she would be considered for Prolia intervention. - we reviewed her dietary and supplemental calcium and vitamin D intake, which I believe are adequate.   -I discussed with her all fall precautions and avoiding vigorous movements to reduce her risk  of fracture.  She is encouraged to continue to use her cane to ambulate.  - we discussed about maintaining a good amount of protein in her diet. -She will have labs  including PTH/calcium, CMP, thyroid function test before her next visit in 6 months.  - I did not initiate any new prescriptions today. - I advised patient to maintain close follow up with Fayrene Helper, MD for primary care needs.  - Time spent with the patient: 45 minutes, of which >50% was spent in obtaining information about her symptoms, reviewing her previous labs, evaluations, and treatments, counseling her about her osteoporosis, and developing a plan to confirm the diagnosis and long term treatment as necessary.  [name] participated in the discussions, expressed understanding, and voiced agreement with the above plans.  All questions were answered to her satisfaction. she is encouraged to contact clinic should she have any questions or concerns prior to her return visit.  Follow up plan: Return in about 6 months (around 02/08/2020) for F/U with Pre-visit Labs.   Glade Lloyd, MD [facility] [address] [place], [place] [address] Phone: [phone]  Fax: [phone]     08/08/2019, 3:47 PM  This note was partially dictated with voice recognition software. Similar sounding words can be transcribed inadequately or may not  be corrected upon review.

## 2019-09-13 ENCOUNTER — Other Ambulatory Visit: Payer: Self-pay | Admitting: Rheumatology

## 2019-09-13 ENCOUNTER — Other Ambulatory Visit: Payer: Self-pay | Admitting: Family Medicine

## 2019-09-13 ENCOUNTER — Other Ambulatory Visit: Payer: Self-pay | Admitting: *Deleted

## 2019-09-13 DIAGNOSIS — I10 Essential (primary) hypertension: Secondary | ICD-10-CM

## 2019-09-13 DIAGNOSIS — M0579 Rheumatoid arthritis with rheumatoid factor of multiple sites without organ or systems involvement: Secondary | ICD-10-CM

## 2019-09-13 DIAGNOSIS — E559 Vitamin D deficiency, unspecified: Secondary | ICD-10-CM

## 2019-09-13 DIAGNOSIS — R7989 Other specified abnormal findings of blood chemistry: Secondary | ICD-10-CM

## 2019-09-13 DIAGNOSIS — E785 Hyperlipidemia, unspecified: Secondary | ICD-10-CM

## 2019-09-13 DIAGNOSIS — E1169 Type 2 diabetes mellitus with other specified complication: Secondary | ICD-10-CM

## 2019-09-13 MED ORDER — POTASSIUM CHLORIDE CRYS ER 20 MEQ PO TBCR: 20.0000 meq | EXTENDED_RELEASE_TABLET | Freq: Every day | ORAL | 1 refills | Status: DC

## 2019-09-13 NOTE — Telephone Encounter (Signed)
Last Visit:05/02/2019 Next Visit:10/03/2019  Current Dose per office note4/27/2021:prednisone 1 mg 4 tablets daily  Okay to refill per Dr. Estanislado Pandy

## 2019-09-19 NOTE — Progress Notes (Signed)
Office Visit Note  Patient: Stacy Meyers             Date of Birth: October 23, 1932           MRN: [id number]             PCP: Fayrene Helper, MD Referring: Fayrene Helper, MD Visit Date: 10/03/2019 Occupation: @GUAROCC @  Accompanied by Porfirio Mylar  Subjective:  Rheumatoid Arthritis (Doing good)   History of Present Illness: Stacy Meyers is a 86 y.o. female with history of rheumatoid arthritis, osteoarthritis and osteoporosis.  She states she has been doing very well as regards to rheumatoid arthritis.  She continues to have some pain and discomfort in her right knee joint.  She has osteoarthritis in her shoulders, hands knee joints and her feet besides rheumatoid arthritis.  The pain is manageable.  The lower back pain is tolerable.  She has been taking Fosamax and had recent bone density by Dr. Moshe Cipro.  Activities of Daily Living:  Patient reports morning stiffness for 3 minutes.   Patient Reports nocturnal pain.  Difficulty dressing/grooming: Reports Difficulty climbing stairs: Reports Difficulty getting out of chair: Reports Difficulty using hands for taps, buttons, cutlery, and/or writing: Denies  Review of Systems  Constitutional: Positive for fatigue.  HENT: Positive for mouth dryness.   Eyes: Positive for dryness.  Respiratory: Positive for shortness of breath.   Cardiovascular: Positive for swelling in legs/feet.  Gastrointestinal: Positive for diarrhea.  Endocrine: Positive for cold intolerance and increased urination.  Genitourinary: Negative for difficulty urinating.  Musculoskeletal: Positive for arthralgias, gait problem, joint pain, joint swelling and morning stiffness.  Skin: Positive for rash.  Allergic/Immunologic: Negative for susceptible to infections.  Neurological: Negative for numbness.  Hematological: Negative for bruising/bleeding tendency.  Psychiatric/Behavioral: Negative for sleep disturbance.    PMFS History:  Patient  Active Problem List   Diagnosis Date Noted  . Itchy scalp 06/22/2019  . Type 2 diabetes mellitus with diabetic neuropathy, without long-term current use of insulin ([place]) 05/08/2019  . Preoperative clearance 05/08/2019  . H/o Chronic heart failure with preserved ejection fraction (HFpEF) ([place]) 04/03/2019  . Umbilical hernia without obstruction and without gangrene 12/10/2018  . Cough with exposure to COVID-19 virus 11/20/2018  . Type 2 diabetes mellitus with complication, without long-term current use of insulin ([place]) 11/20/2018  . Fibroids 05/08/2016  . Spondylosis of lumbar region without myelopathy or radiculopathy 12/24/2015  . Solitary bone cyst of right shoulder 05/28/2015  . Right knee pain 05/28/2015  . Osteoarthritis of both shoulders 05/28/2015  . Overweight (BMI 25.0-29.9) 04/28/2014  . Ganglion cyst of wrist 05/11/2012  . Systolic murmur 44/03/4740  . Diastolic dysfunction 59/56/3875  . Seasonal allergies 08/28/2010  . Type 2 diabetes, diet controlled ([place]) 07/25/2009  . Herpes simplex virus (HSV) infection 09/16/2008  . Rheumatoid arthritis with rheumatoid factor of multiple sites without organ or systems involvement ([place]) 09/16/2008  . Hyperlipidemia LDL goal <100 05/23/2008  . Essential hypertension, benign 05/23/2008    Past Medical History:  Diagnosis Date  . Acute cystitis with hematuria 04/11/2011  . Anemia   . ARF (acute renal failure) ([place]) 12/14/2010  . CKD (chronic kidney disease), stage II    previously seen by nephrologist--- dr Florene Glen,  pt released per lov in epic 09-24-2017  . Diabetes mellitus, type II ([place])   . Diverticulosis of colon   . Essential hypertension, benign    followed by pcp   (pt had nuclear stress test (in epic under  media) dated 05-25-2008 showed normal perfusion w/ no evidence ischemia, ef 83%  . History of bradycardia    previously seen by cardiologist, dr branch, pt released per lov 08-11-2018 in epic, bradycardia resolved  . History  of sepsis    04-03-2019 hospital admission w/ urosepsis due to obstructive uropathy due to right ureter stone  . Hypokalemia   . Lipoma of colon    ascending  . Lumbar spondylosis   . Mixed hyperlipidemia   . Nephrolithiasis    per CT 04-03-2019 nonobstructive bilateral stones  . Ocular herpes   . Osteoarthritis    followed by dr Nyelle Wolfson--- hands, shoulders, feet  . Osteoporosis   . Rheumatoid arthritis(714.0)    rheumotologist-- dr s. Estanislado Pandy---  multiple sites, treated with methotrexate/ prednisone  . Right nephrolithiasis-1.4cm Moderate right-sided urinary tract obstruction/hydronephrosis 04/03/2019  . Right ureteral calculus   . Seasonal allergies   . Sepsis ([place]) 04/03/2019  . Sepsis secondary to UTI /Nephrolithiasis with obstructive uropathy 04/03/2019  . SUI (stress urinary incontinence, female)   . Uterine fibroid     Family History  Problem Relation Age of Onset  . Stroke Mother   . Cancer Father   . Diabetes Sister   . Diabetes Sister   . Kidney failure Brother   . Kidney failure Brother   . Pancreatic cancer Sister   . Kidney Stones Sister        Only one Kidney  . Arthritis Sister   . Heart disease Daughter    Past Surgical History:  Procedure Laterality Date  . BUNIONECTOMY Right 2012  . CATARACT EXTRACTION W/ INTRAOCULAR LENS  IMPLANT, BILATERAL  2009  . CHOLECYSTECTOMY  12/17/2010   Procedure: LAPAROSCOPIC CHOLECYSTECTOMY;  Surgeon: Donato Heinz;  Location: [facility];  Service: General;  Laterality: N/A;  . COLONOSCOPY  05/22/2011   Procedure: COLONOSCOPY;  Surgeon: Rogene Houston, MD;  Location: [facility];  Service: Endoscopy;  Laterality: N/A;  1045  . CYSTOSCOPY WITH RETROGRADE PYELOGRAM, URETEROSCOPY AND STENT PLACEMENT Right 05/09/2019   Procedure: CYSTOSCOPY WITH RETROGRADE PYELOGRAM, URETEROSCOPY AND STENT PLACEMENT;  Surgeon: Robley Fries, MD;  [facility];  Service: Urology;  Laterality: Right;  1 HR  . CYSTOSCOPY  WITH STENT PLACEMENT Right 04/03/2019   Procedure: CYSTOSCOPY, RIGHT RETROGRADE PYELOGRAM  WITH STENT PLACEMENT;  Surgeon: Robley Fries, MD;  Location: [facility];  Service: Urology;  Laterality: Right;  . EXCISION BONE CYST Right 2010   RIGHT SHOULDER  . GANGLION CYST EXCISION Left 06/02/2012   Procedure: Excision ganglion left dorsal wrist;  Surgeon: Sanjuana Kava, MD;  Location: [facility];  Service: Orthopedics;  Laterality: Left;  . HOLMIUM LASER APPLICATION Right 01/10/6061   Procedure: HOLMIUM LASER APPLICATION;  Surgeon: Robley Fries, MD;  Location: [facility];  Service: Urology;  Laterality: Right;  . LUMBAR MICRODISCECTOMY  07-16-2000   @[facility]    L 2  -3  . POSTERIOR LAMINECTOMY / DECOMPRESSION LUMBAR SPINE  06-26-2009   @[facility]    w/ LATERAL MASS FUSION , L3 -- 5  . SHOULDER ARTHROSCOPY WITH DISTAL CLAVICLE RESECTION Right 01-10-2009   @[facility]    w/ DEBRIDEMENT AND EXCISION RECURRENT GANGLION CYST   Social History   Social History Narrative   Husband passed away 12-Apr-2016. It has been financially harder to pay the bills since his passing, but she is making it. Has grandchildren around which makes it easier.    Immunization History  Administered Date(s) Administered  .  Influenza Split 11/07/2013  . Influenza Whole 10/28/2009, 09/17/2010  . Influenza, High Dose Seasonal PF 09/29/2016, 09/15/2018  . Influenza,inj,Quad PF,6+ Mos 09/03/2014  . Influenza-Unspecified 10/05/2013, 09/29/2016  . Moderna SARS-COVID-2 Vaccination 01/17/2019, 02/27/2019  . PPD Test 05/14/2014, 05/28/2015  . Pneumococcal Conjugate-13 08/03/2013  . Pneumococcal Polysaccharide-23 08/28/2009  . Tdap 04/29/2010     Objective: Vital Signs: BP (!) 144/64 (BP Location: Left Arm, Patient Position: Sitting, Cuff Size: Normal)   Pulse 61   Resp 18   Ht 4\' 11"  (1.499 m)   Wt 137 lb 6.4 oz (62.3 kg)   BMI 27.75 kg/m    Physical Exam Vitals and nursing note reviewed.  Constitutional:       Appearance: She is well-developed.  HENT:     Head: Normocephalic and atraumatic.  Eyes:     Conjunctiva/sclera: Conjunctivae normal.  Cardiovascular:     Rate and Rhythm: Normal rate and regular rhythm.     Heart sounds: Normal heart sounds.  Pulmonary:     Effort: Pulmonary effort is normal.     Breath sounds: Normal breath sounds.  Abdominal:     General: Bowel sounds are normal.     Palpations: Abdomen is soft.  Musculoskeletal:     Cervical back: Normal range of motion.  Lymphadenopathy:     Cervical: No cervical adenopathy.  Skin:    General: Skin is warm and dry.     Capillary Refill: Capillary refill takes less than 2 seconds.  Neurological:     Mental Status: She is alert and oriented to person, place, and time.  Psychiatric:        Behavior: Behavior normal.      Musculoskeletal Exam: CT spine was in good range of motion.  She had shoulder abduction limited to about 80 degrees bilaterally.  Elbow joints with good range of motion.  She has limited range of motion of her wrist joints with mild synovitis in her left wrist joint.  There was no synovitis over MCPs or PIPs.  She has valgus deformity in her right knee joint due to chronic rheumatoid arthritis and osteoarthritis.  There was no tenderness over ankle joints or MTPs.  CDAI Exam: CDAI Score: 1.4  Patient Global: 2 mm; Provider Global: 2 mm Swollen: 1 ; Tender: 0  Joint Exam 10/03/2019      Right  Left  Wrist     Swollen      Investigation: No additional findings.  Imaging: No results found.  Recent Labs: Lab Results  Component Value Date   WBC 11.8 (H) 04/06/2019   HGB 12.9 05/09/2019   PLT 135 (L) 04/06/2019   NA 143 05/09/2019   K 4.0 05/09/2019   CL 109 05/09/2019   CO2 21 (L) 04/06/2019   GLUCOSE 106 (H) 05/09/2019   BUN 27 (H) 05/09/2019   CREATININE 1.00 05/09/2019   BILITOT 0.8 04/03/2019   ALKPHOS 83 04/03/2019   AST 21 04/03/2019   ALT 13 04/03/2019   PROT 5.1 (L) 04/03/2019    ALBUMIN 2.6 (L) 04/03/2019   CALCIUM 9.2 04/06/2019   GFRAA >60 04/06/2019    Speciality Comments: No specialty comments available.  Procedures:  No procedures performed Allergies: Aspirin and Sudafed [pseudoephedrine hcl]   Assessment / Plan:     Visit Diagnoses: Rheumatoid arthritis with rheumatoid factor of multiple sites without organ or systems involvement (HCC)-she had mild synovitis in her left wrist joint but not much discomfort.  She continues to have some discomfort in her  right knee joint due to osteoarthritis and valgus deformity.  She has been tolerating medication well.  She has not had her labs on a regular basis.  The need for monitoring labs was emphasized.  High risk medication use - Methotrexate 3 tablets every 7 days, folic acid 1 mg daily, and prednisone 1 mg 4 tablets daily. - Plan: CBC with Differential/Platelet, COMPLETE METABOLIC PANEL WITH GFR today and then every 3 months to monitor for drug toxicity.  Primary osteoarthritis of both hands -she had mild synovitis in her left wrist joint.  Plan: XR Hand 2 View Right, XR Hand 2 View Left.  X-rays are consistent with severe osteoarthritis and rheumatoid arthritis overlap.  Primary osteoarthritis of both shoulders-shoulder joint abduction is limited due to osteoarthritis and rheumatoid arthritis overlap.  Primary osteoarthritis of both knees-she continues to have mild discomfort in her right knee joint due to rheumatoid arthritis, osteoarthritis overlap and valgus deformity..  She uses a cane.  Primary osteoarthritis of both feet -she has rheumatoid arthritis and osteoarthritis overlap.  She has intermittent discomfort.  Plan: XR Foot 2 Views Right, XR Foot 2 Views Left.  X-rays are consistent with severe rheumatoid arthritis and osteoarthritis overlap.  Spondylosis of lumbar region without myelopathy or radiculopathy-she is currently not experiencing any pain.  Age-related osteoporosis without current pathological  fracture - Fosamax 70 mg 1 tablet by mouth once weekly.07/31/19 DualFemur Neck Left 07/31/2019 85.4 Osteopenia -2.0 4.315 g/cm2  Systolic murmur  Type 2 diabetes, diet controlled ([place])  Essential hypertension  Educated about COVID-19 virus infection-instructions were given per ACR guidelines.  Handout was placed in the AVS.  Orders: Orders Placed This Encounter  Procedures  . XR Hand 2 View Right  . XR Hand 2 View Left  . XR Foot 2 Views Right  . XR Foot 2 Views Left  . CBC with Differential/Platelet  . COMPLETE METABOLIC PANEL WITH GFR   No orders of the defined types were placed in this encounter.     Follow-Up Instructions: Return for Rheumatoid arthritis, Osteoarthritis, Osteoporosis.   Bo Merino, MD  Note - This record has been created using Editor, commissioning.  Chart creation errors have been sought, but may not always  have been located. Such creation errors do not reflect on  the standard of medical care.

## 2019-09-29 ENCOUNTER — Other Ambulatory Visit: Payer: Self-pay | Admitting: Rheumatology

## 2019-09-29 ENCOUNTER — Other Ambulatory Visit: Payer: Self-pay | Admitting: Family Medicine

## 2019-09-29 NOTE — Telephone Encounter (Signed)
Last Visit: 05/02/2019 Next Visit: 10/03/2019 Labs: 04/06/2019 (CBC, BMP) WBC 11.8, RBC 3.50, hemoglobin 10.8, HCT 34.1, platelets 135, nRBC 0.4, neutro abs 9.7, abs immature granulocytes 0.18, chloride 115, CO2 21, glucose 113, BUN 27  Current Dose per office note on 05/02/2019: Methotrexate 3 tablets every 7 days DX: Rheumatoid arthritis with rheumatoid factor of multiple sites without organ or systems involvement    Attempted to contact patient and left message on machine to advise patient she is overdue for labs. Advised patient she can come to our office or go to the [facility] in [place]. Advised patient to call the office to advise and we will send orders.

## 2019-10-03 ENCOUNTER — Ambulatory Visit: Payer: Self-pay

## 2019-10-03 ENCOUNTER — Ambulatory Visit: Payer: Medicare HMO | Admitting: Rheumatology

## 2019-10-03 ENCOUNTER — Encounter: Payer: Self-pay | Admitting: Rheumatology

## 2019-10-03 ENCOUNTER — Other Ambulatory Visit: Payer: Self-pay

## 2019-10-03 VITALS — BP 144/64 | HR 61 | Resp 18 | Ht 59.0 in | Wt 137.4 lb

## 2019-10-03 DIAGNOSIS — M19071 Primary osteoarthritis, right ankle and foot: Secondary | ICD-10-CM | POA: Diagnosis not present

## 2019-10-03 DIAGNOSIS — M19041 Primary osteoarthritis, right hand: Secondary | ICD-10-CM

## 2019-10-03 DIAGNOSIS — M810 Age-related osteoporosis without current pathological fracture: Secondary | ICD-10-CM

## 2019-10-03 DIAGNOSIS — M47816 Spondylosis without myelopathy or radiculopathy, lumbar region: Secondary | ICD-10-CM

## 2019-10-03 DIAGNOSIS — Z79899 Other long term (current) drug therapy: Secondary | ICD-10-CM | POA: Diagnosis not present

## 2019-10-03 DIAGNOSIS — M19072 Primary osteoarthritis, left ankle and foot: Secondary | ICD-10-CM | POA: Diagnosis not present

## 2019-10-03 DIAGNOSIS — M170 Bilateral primary osteoarthritis of knee: Secondary | ICD-10-CM

## 2019-10-03 DIAGNOSIS — E119 Type 2 diabetes mellitus without complications: Secondary | ICD-10-CM

## 2019-10-03 DIAGNOSIS — M19042 Primary osteoarthritis, left hand: Secondary | ICD-10-CM | POA: Diagnosis not present

## 2019-10-03 DIAGNOSIS — M19012 Primary osteoarthritis, left shoulder: Secondary | ICD-10-CM

## 2019-10-03 DIAGNOSIS — M0579 Rheumatoid arthritis with rheumatoid factor of multiple sites without organ or systems involvement: Secondary | ICD-10-CM | POA: Diagnosis not present

## 2019-10-03 DIAGNOSIS — R011 Cardiac murmur, unspecified: Secondary | ICD-10-CM | POA: Diagnosis not present

## 2019-10-03 DIAGNOSIS — I10 Essential (primary) hypertension: Secondary | ICD-10-CM

## 2019-10-03 DIAGNOSIS — M19011 Primary osteoarthritis, right shoulder: Secondary | ICD-10-CM | POA: Diagnosis not present

## 2019-10-03 DIAGNOSIS — Z7189 Other specified counseling: Secondary | ICD-10-CM

## 2019-10-03 NOTE — Patient Instructions (Addendum)
COVID-19 vaccine recommendations:   COVID-19 vaccine is recommended for everyone (unless you are allergic to a vaccine component), even if you are on a medication that suppresses your immune system.   If you are on Methotrexate, Cellcept (mycophenolate), Rinvoq, Morrie Sheldon, and Olumiant- hold the medication for 1 week after each vaccine. Hold Methotrexate for 2 weeks after the single dose COVID-19 vaccine.   Do not take Tylenol or any anti-inflammatory medications (NSAIDs) 24 hours prior to the COVID-19 vaccination.   There is no direct evidence about the efficacy of the COVID-19 vaccine in individuals who are on medications that suppress the immune system.   Even if you are fully vaccinated, and you are on any medications that suppress your immune system, please continue to wear a mask, maintain at least six feet social distance and practice hand hygiene.   If you develop a COVID-19 infection, please contact your PCP or our office to determine if you need antibody infusion.  The booster vaccine is now available for immunocompromised patients. It is advised that if you had Pfizer vaccine you should get [employer] booster.  If you had a Moderna vaccine then you should get a Moderna booster. Johnson and Wynetta Emery does not have a booster vaccine at this time.  Please see the following web sites for updated information.   [email or web address]://[email or web address]/[email or web address]/Files/COVID-19-Vaccination-Patient-Resources.[email or web address]  [email or web address]://[email or web address]/About-Us/[email or web address]-Releases/ID/1159  Standing Labs We placed an order today for your standing lab work.   Please have your standing labs drawn in December and every 3 months  If possible, please have your labs drawn 2 weeks prior to your appointment so that the provider can discuss your results at your appointment.  We have open lab daily Monday through Thursday from 8:30-12:30 PM and 1:30-4:30 PM and Friday from 8:30-12:30 PM and 1:30-4:00 PM at the office of  Dr. Bo Merino, [facility].   Please be advised, patients with office appointments requiring lab work will take precedents over walk-in lab work.  If possible, please come for your lab work on Monday and Friday afternoons, as you may experience shorter wait times. The office is located at [address], [place], [place], [place] [address] No appointment is necessary.   Labs are drawn by [facility]. Please bring your co-pay at the time of your lab draw.  You may receive a bill from [facility] for your lab work.  If you wish to have your labs drawn at another location, please call the office 24 hours in advance to send orders.  If you have any questions regarding directions or hours of operation,  please call [phone].   As a reminder, please drink plenty of water prior to coming for your lab work. Thanks!

## 2019-10-04 LAB — COMPLETE METABOLIC PANEL WITH GFR
AG Ratio: 1.8 (calc) (ref 1.0–2.5)
ALT: 6 U/L (ref 6–29)
AST: 12 U/L (ref 10–35)
Albumin: 4.2 g/dL (ref 3.6–5.1)
Alkaline phosphatase (APISO): 61 U/L (ref 37–153)
BUN: 20 mg/dL (ref 7–25)
CO2: 28 mmol/L (ref 20–32)
Calcium: 10.1 mg/dL (ref 8.6–10.4)
Chloride: 107 mmol/L (ref 98–110)
Creat: 0.86 mg/dL (ref 0.60–0.88)
GFR, Est African American: 71 mL/min/{1.73_m2} (ref >=60)
GFR, Est Non African American: 62 mL/min/{1.73_m2} (ref >=60)
Globulin: 2.3 g/dL (calc) (ref 1.9–3.7)
Glucose, Bld: 106 mg/dL — ABNORMAL HIGH (ref 65–99)
Potassium: 4.6 mmol/L (ref 3.5–5.3)
Sodium: 142 mmol/L (ref 135–146)
Total Bilirubin: 0.3 mg/dL (ref 0.2–1.2)
Total Protein: 6.5 g/dL (ref 6.1–8.1)

## 2019-10-04 LAB — CBC WITH DIFFERENTIAL/PLATELET
Absolute Monocytes: 280 cells/uL (ref 200–950)
Basophils Absolute: 28 cells/uL (ref 0–200)
Basophils Relative: 0.4 %
Eosinophils Absolute: 21 cells/uL (ref 15–500)
Eosinophils Relative: 0.3 %
HCT: 38.1 % (ref 35.0–45.0)
Hemoglobin: 12.8 g/dL (ref 11.7–15.5)
Lymphs Abs: 1022 cells/uL (ref 850–3900)
MCH: 31.4 pg (ref 27.0–33.0)
MCHC: 33.6 g/dL (ref 32.0–36.0)
MCV: 93.4 fL (ref 80.0–100.0)
MPV: 9.2 fL (ref 7.5–12.5)
Monocytes Relative: 4 %
Neutro Abs: 5649 cells/uL (ref 1500–7800)
Neutrophils Relative %: 80.7 %
Platelets: 277 10*3/uL (ref 140–400)
RBC: 4.08 10*6/uL (ref 3.80–5.10)
RDW: 13.1 % (ref 11.0–15.0)
Total Lymphocyte: 14.6 %
WBC: 7 10*3/uL (ref 3.8–10.8)

## 2019-10-04 NOTE — Progress Notes (Signed)
CBC is normal, glucose is mildly elevated probably not fasting.

## 2019-10-19 ENCOUNTER — Ambulatory Visit (INDEPENDENT_AMBULATORY_CARE_PROVIDER_SITE_OTHER): Payer: Medicare HMO | Admitting: Family Medicine

## 2019-10-19 ENCOUNTER — Other Ambulatory Visit: Payer: Self-pay

## 2019-10-19 ENCOUNTER — Encounter: Payer: Self-pay | Admitting: Family Medicine

## 2019-10-19 VITALS — BP 122/70 | HR 61 | Ht 59.0 in | Wt 131.0 lb

## 2019-10-19 DIAGNOSIS — N3001 Acute cystitis with hematuria: Secondary | ICD-10-CM

## 2019-10-19 DIAGNOSIS — Z23 Encounter for immunization: Secondary | ICD-10-CM

## 2019-10-19 LAB — POCT URINALYSIS DIPSTICK
Bilirubin, UA: NEGATIVE
Glucose, UA: NEGATIVE
Nitrite, UA: NEGATIVE
Protein, UA: NEGATIVE
Spec Grav, UA: 1.02 (ref 1.010–1.025)
Urobilinogen, UA: 0.2 E.U./dL
pH, UA: 7 (ref 5.0–8.0)

## 2019-10-19 MED ORDER — SULFAMETHOXAZOLE-TRIMETHOPRIM 800-160 MG PO TABS: 1.0000 | ORAL_TABLET | Freq: Two times a day (BID) | ORAL | 0 refills | Status: AC

## 2019-10-19 NOTE — Assessment & Plan Note (Signed)
Symptomatic, Antibiotic prescribed.

## 2019-10-19 NOTE — Progress Notes (Addendum)
Subjective:  Patient ID: Stacy Meyers, female    DOB: May 02, 1932  Age: 86 y.o. MRN: [id number]  CC:  Chief Complaint  Patient presents with  . Urinary Frequency    x2 days, no odor or burning sensation      HPI  Urinary Frequency  This is a new problem. The current episode started yesterday. The problem occurs every urination. The problem has been unchanged. The pain is at a severity of 0/10. The patient is experiencing no pain. There has been no fever. She is not sexually active. There is no history of pyelonephritis. Associated symptoms include frequency and urgency. She has tried nothing for the symptoms. Her past medical history is significant for kidney stones and recurrent UTIs.    Today patient denies signs and symptoms of COVID 19 infection including fever, chills, cough, shortness of breath, and headache. Past Medical, Surgical, Social History, Allergies, and Medications have been Reviewed.   Past Medical History:  Diagnosis Date  . Acute cystitis with hematuria 04/11/2011  . Anemia   . ARF (acute renal failure) ([place]) 12/14/2010  . CKD (chronic kidney disease), stage II    previously seen by nephrologist--- dr Florene Glen,  pt released per lov in epic 09-24-2017  . Diabetes mellitus, type II ([place])   . Diverticulosis of colon   . Essential hypertension, benign    followed by pcp   (pt had nuclear stress test (in epic under media) dated 05-25-2008 showed normal perfusion w/ no evidence ischemia, ef 83%  . History of bradycardia    previously seen by cardiologist, dr branch, pt released per lov 08-11-2018 in epic, bradycardia resolved  . History of sepsis    04-03-2019 hospital admission w/ urosepsis due to obstructive uropathy due to right ureter stone  . Hypokalemia   . Lipoma of colon    ascending  . Lumbar spondylosis   . Mixed hyperlipidemia   . Nephrolithiasis    per CT 04-03-2019 nonobstructive bilateral stones  . Ocular herpes   . Osteoarthritis    followed  by dr deveshwar--- hands, shoulders, feet  . Osteoporosis   . Rheumatoid arthritis(714.0)    rheumotologist-- dr s. Estanislado Pandy---  multiple sites, treated with methotrexate/ prednisone  . Right nephrolithiasis-1.4cm Moderate right-sided urinary tract obstruction/hydronephrosis 04/03/2019  . Right ureteral calculus   . Seasonal allergies   . Sepsis ([place]) 04/03/2019  . Sepsis secondary to UTI /Nephrolithiasis with obstructive uropathy 04/03/2019  . SUI (stress urinary incontinence, female)   . Uterine fibroid     Current Meds  Medication Sig  . acetaminophen (TYLENOL) 500 MG tablet Take 1,000 mg by mouth every 6 (six) hours as needed (for pain).   Marland Kitchen acyclovir (ZOVIRAX) 400 MG tablet TAKE 1 TABLET BY MOUTH TWICE A DAY  . alendronate (FOSAMAX) 70 MG tablet TAKE 1 TABLET BY MOUTH EVERY 7 DAYS. TAKE WITH A FULL GLASS OF WATER ON AN EMPTY STOMACH.  Marland Kitchen amLODipine (NORVASC) 5 MG tablet TAKE 1 TABLET BY MOUTH EVERY DAY  . azelastine (ASTELIN) 0.1 % nasal spray Place 2 sprays into both nostrils 2 (two) times daily. Use in each nostril as directed (Patient taking differently: Place 2 sprays into both nostrils as needed for allergies. )  . ELDERBERRY PO Take 1 capsule by mouth daily. Gummy with vitamin c and zinc  . folic acid (FOLVITE) 1 MG tablet Take 1 tablet (1 mg total) by mouth daily.  . methotrexate (RHEUMATREX) 2.5 MG tablet TAKE 3 TABLETS BY MOUTH  ONCE A WEEK. TAKE AS DIRECTED CAUTION:CHEMOTHERAPY. PROTECT FROM LIGHT  . montelukast (SINGULAIR) 10 MG tablet TAKE 1 TABLET BY MOUTH EVERYDAY AT BEDTIME  . potassium chloride SA (KLOR-CON M20) 20 MEQ tablet Take 1 tablet (20 mEq total) by mouth daily.  . predniSONE (DELTASONE) 1 MG tablet TAKE 4 TABLETS (4 MG TOTAL) BY MOUTH DAILY WITH BREAKFAST.  Marland Kitchen Propylene Glycol (SYSTANE BALANCE OP) Place 1 drop into both eyes daily as needed (for dry eyes).   . rosuvastatin (CRESTOR) 5 MG tablet TAKE 1 TABLET BY MOUTH EVERY OTHER DAY  . UNABLE TO FIND Diabetic  shoes x 1 inserts x 3  Dx e11.9  . UNABLE TO FIND Diabetic Shoes x1 pair inserts x3 pair Dx code E11.9    ROS:  Review of Systems  Constitutional: Negative.   HENT: Negative.   Eyes: Negative.   Respiratory: Negative.   Cardiovascular: Negative.   Gastrointestinal: Negative.   Genitourinary: Positive for frequency and urgency.  Musculoskeletal: Negative.   Skin: Negative.   Neurological: Negative.   Endo/Heme/Allergies: Negative.   Psychiatric/Behavioral: Negative.      Objective:   Today's Vitals: BP 122/70   Pulse 61   Ht 4\' 11"  (1.499 m)   Wt 131 lb (59.4 kg)   SpO2 96%   BMI 26.46 kg/m  Vitals with BMI 10/19/2019 10/03/2019 08/08/2019  Height 4\' 11"  4\' 11"  4\' 11"   Weight 131 lbs 137 lbs 6 oz 135 lbs 10 oz  BMI 26.44 66.06 30.16  Systolic 010 932 355  Diastolic 70 64 54  Pulse 61 61 60     Physical Exam Vitals and nursing note reviewed.  Constitutional:      Appearance: Normal appearance. She is well-developed, well-groomed and overweight.  HENT:     Head: Normocephalic and atraumatic.     Right Ear: External ear normal.     Left Ear: External ear normal.     Mouth/Throat:     Comments: Mask in place  Eyes:     General:        Right eye: No discharge.        Left eye: No discharge.     Conjunctiva/sclera: Conjunctivae normal.  Cardiovascular:     Rate and Rhythm: Normal rate and regular rhythm.     Pulses: Normal pulses.     Heart sounds: Normal heart sounds.  Pulmonary:     Effort: Pulmonary effort is normal.     Breath sounds: Normal breath sounds.  Abdominal:     Tenderness: There is no right CVA tenderness or left CVA tenderness.  Musculoskeletal:        General: Normal range of motion.     Cervical back: Normal range of motion and neck supple.  Skin:    General: Skin is warm.  Neurological:     General: No focal deficit present.     Mental Status: She is alert and oriented to person, place, and time.  Psychiatric:        Attention and  Perception: Attention normal.        Mood and Affect: Mood normal.        Speech: Speech normal.        Behavior: Behavior normal. Behavior is cooperative.        Thought Content: Thought content normal.        Cognition and Memory: Cognition normal.        Judgment: Judgment normal.    Assessment   1. Acute cystitis with  hematuria     Tests ordered No orders of the defined types were placed in this encounter.    Plan: Please see assessment and plan per problem list above.   Meds ordered this encounter  Medications  . sulfamethoxazole-trimethoprim (BACTRIM DS) 800-160 MG tablet    Sig: Take 1 tablet by mouth 2 (two) times daily for 3 days.    Dispense:  6 tablet    Refill:  0    Order Specific Question:   Supervising Provider    Answer:   Fayrene Helper [[id number]]    Patient to follow-up in as scheduled   Note: This dictation was prepared with Dragon dictation along with smaller phrase technology. Similar sounding words can be transcribed inadequately or may not be corrected upon review. Any transcriptional errors that result from this process are unintentional.      Perlie Mayo, NP

## 2019-10-19 NOTE — Patient Instructions (Signed)
  HAPPY FALL!  I appreciate the opportunity to provide you with care for your health and wellness. Today we discussed: UTI   Follow up: 10/24/2019 as scheduled  No labs or referrals today  Urine will be sent out and if medication needs to be adjusted it will. We will call you about it  Otherwise, please take the medication that has been sent in for you today. Drink a full glass of water with each dose.   Please continue to practice social distancing to keep you, your family, and our community safe.  If you must go out, please wear a mask and practice good handwashing.  It was a pleasure to see you and I look forward to continuing to work together on your health and well-being. Please do not hesitate to call the office if you need care or have questions about your care.  Have a wonderful day and week. With Gratitude, Cherly Beach, DNP, AGNP-BC

## 2019-10-20 ENCOUNTER — Other Ambulatory Visit (HOSPITAL_COMMUNITY)
Admission: RE | Admit: 2019-10-20 | Discharge: 2019-10-20 | Disposition: A | Discharge status: Home / Self Care | Payer: Medicare HMO | Source: Other Acute Inpatient Hospital | Attending: Family Medicine | Admitting: Family Medicine

## 2019-10-20 DIAGNOSIS — N3001 Acute cystitis with hematuria: Secondary | ICD-10-CM | POA: Insufficient documentation

## 2019-10-21 LAB — URINE CULTURE: Culture: 70000 — AB

## 2019-10-24 ENCOUNTER — Encounter: Payer: Self-pay | Admitting: Family Medicine

## 2019-10-24 ENCOUNTER — Other Ambulatory Visit: Payer: Self-pay

## 2019-10-24 ENCOUNTER — Other Ambulatory Visit: Payer: Self-pay | Admitting: Family Medicine

## 2019-10-24 ENCOUNTER — Ambulatory Visit (INDEPENDENT_AMBULATORY_CARE_PROVIDER_SITE_OTHER): Payer: Medicare HMO | Admitting: Family Medicine

## 2019-10-24 VITALS — BP 138/70 | HR 84 | Resp 16 | Ht 59.0 in | Wt 134.1 lb

## 2019-10-24 DIAGNOSIS — R7303 Prediabetes: Secondary | ICD-10-CM

## 2019-10-24 DIAGNOSIS — E785 Hyperlipidemia, unspecified: Secondary | ICD-10-CM | POA: Diagnosis not present

## 2019-10-24 DIAGNOSIS — Z0001 Encounter for general adult medical examination with abnormal findings: Secondary | ICD-10-CM | POA: Insufficient documentation

## 2019-10-24 DIAGNOSIS — E663 Overweight: Secondary | ICD-10-CM

## 2019-10-24 DIAGNOSIS — Z0000 Encounter for general adult medical examination without abnormal findings: Secondary | ICD-10-CM | POA: Insufficient documentation

## 2019-10-24 DIAGNOSIS — E119 Type 2 diabetes mellitus without complications: Secondary | ICD-10-CM

## 2019-10-24 LAB — POCT GLYCOSYLATED HEMOGLOBIN (HGB A1C): Hemoglobin A1C: 6 % — AB (ref 4.0–5.6)

## 2019-10-24 NOTE — Patient Instructions (Addendum)
F/U in office with MD in 6 months, calll if you need me sooner.  Blood sugar has improved, now 6.0 is your average for 3 months. You are NOT diabetic, continue to limit sweets and starches, increase vegetables and drink a lot off water  Labs today lipid, hepatic panel and tSH  Be careful not to fall  Continue regular exercise commitment  Thanks for choosing [facility], we consider it a privelige to serve you.

## 2019-10-25 LAB — LIPID PANEL
Cholesterol: 146 mg/dL (ref <200)
HDL: 56 mg/dL (ref >=50)
LDL Cholesterol (Calc): 69 mg/dL (calc)
Non-HDL Cholesterol (Calc): 90 mg/dL (calc) (ref <130)
Total CHOL/HDL Ratio: 2.6 (calc) (ref <5.0)
Triglycerides: 128 mg/dL (ref <150)

## 2019-10-25 LAB — TSH: TSH: 0.98 mIU/L (ref 0.40–4.50)

## 2019-10-27 ENCOUNTER — Other Ambulatory Visit: Payer: Self-pay | Admitting: Rheumatology

## 2019-10-27 ENCOUNTER — Encounter: Payer: Self-pay | Admitting: Family Medicine

## 2019-10-27 NOTE — Progress Notes (Signed)
    Stacy Meyers     MRN: [id number]      DOB: [birth year]  HPI: Patient is in for annual physical exam. Blood sugar is tested for diabetes screen  Immunization is reviewed , and  Is up to date   PE: BP 138/70   Pulse 84   Resp 16   Ht 4\' 11"  (1.499 m)   Wt 134 lb 1.9 oz (60.8 kg)   SpO2 94%   BMI 27.09 kg/m   Pleasant  female, alert and oriented x 3, in no cardio-pulmonary distress. Afebrile. HEENT No facial trauma or asymetry. Sinuses non tender.  Extra occullar muscles intact.. External ears normal, . Neck: supple, no adenopathy,JVD or thyromegaly.No bruits.  Chest: Clear to ascultation bilaterally.No crackles or wheezes. Non tender to palpation  Breast: Normal mammogram in 06/2019   Cardiovascular system; Heart sounds normal,  S1 and  S2 ,no S3.  Systolic  murmur, or thrill.  Peripheral pulses normal.  Abdomen: Soft, non tender,  No guarding, tenderness or rebound.    Musculoskeletal exam: Decreased  ROM of spine, hips , shoulders and knees. Deformity ,swelling or crepitus noted. No muscle wasting or atrophy.   Neurologic: Cranial nerves 2 to 12 intact. Power, tone ,sensation and reflexes normal throughout. No disturbance in gait. No tremor.  Skin: Intact, no ulceration, erythema , scaling or rash noted. Pigmentation normal throughout  Psych; Normal mood and affect. Judgement and concentration normal   Assessment & Plan:  Encounter for annual general medical examination with abnormal findings in adult Annual exam as documented. Counseling done  re healthy lifestyle involving commitment to 150 minutes exercise per week, heart healthy diet, and attaining healthy weight.The importance of adequate sleep also discussed. Regular seat belt use and home safety, is also discussed. Changes in health habits are decided on by the patient with goals and time frames  set for achieving them. Immunization and cancer screening needs are specifically addressed  at this visit.   Prediabetes Patient educated about the importance of limiting  Carbohydrate intake , the need to commit to daily physical activity for a minimum of 30 minutes , and to commit weight loss. The fact that changes in all these areas will reduce or eliminate all together the development of diabetes is stressed.   Diabetic Labs Latest Ref Rng & Units 10/24/2019 10/03/2019 05/09/2019 04/06/2019 04/05/2019  HbA1c 4.0 - 5.6 % 6.0(A) - - - -  Microalbumin mg/dL - - - - -  Micro/Creat Ratio <30 mcg/mg creat - - - - -  Chol <200 mg/dL 146 - - - -  HDL > OR = 50 mg/dL 56 - - - -  Calc LDL mg/dL (calc) 69 - - - -  Triglycerides <150 mg/dL 128 - - - -  Creatinine 0.60 - 0.88 mg/dL - 0.86 1.00 0.90 1.04(H)   BP/Weight 10/24/2019 10/19/2019 10/03/2019 08/08/2019 07/17/2019 09/26/1939 07/08/812  Systolic BP 481 856 314 970 263 785 885  Diastolic BP 70 70 64 54 72 72 61  Wt. (Lbs) 134.12 131 137.4 135.6 134 135 132.3  BMI 27.09 26.46 27.75 27.39 27.06 27.27 26.72   Foot/eye exam completion dates Latest Ref Rng & Units 05/08/2019 12/07/2018  Eye Exam No Retinopathy - -  Foot Form Completion - Done Done

## 2019-10-28 ENCOUNTER — Encounter: Payer: Self-pay | Admitting: Family Medicine

## 2019-10-28 DIAGNOSIS — R7303 Prediabetes: Secondary | ICD-10-CM | POA: Insufficient documentation

## 2019-10-28 NOTE — Assessment & Plan Note (Signed)

## 2019-10-28 NOTE — Assessment & Plan Note (Signed)
Patient educated about the importance of limiting  Carbohydrate intake , the need to commit to daily physical activity for a minimum of 30 minutes , and to commit weight loss. The fact that changes in all these areas will reduce or eliminate all together the development of diabetes is stressed.   Diabetic Labs Latest Ref Rng & Units 10/24/2019 10/03/2019 05/09/2019 04/06/2019 04/05/2019  HbA1c 4.0 - 5.6 % 6.0(A) - - - -  Microalbumin mg/dL - - - - -  Micro/Creat Ratio <30 mcg/mg creat - - - - -  Chol <200 mg/dL 146 - - - -  HDL > OR = 50 mg/dL 56 - - - -  Calc LDL mg/dL (calc) 69 - - - -  Triglycerides <150 mg/dL 128 - - - -  Creatinine 0.60 - 0.88 mg/dL - 0.86 1.00 0.90 1.04(H)   BP/Weight 10/24/2019 10/19/2019 10/03/2019 08/08/2019 07/17/2019 08/23/4035 05/08/3604  Systolic BP 770 340 352 481 859 093 112  Diastolic BP 70 70 64 54 72 72 61  Wt. (Lbs) 134.12 131 137.4 135.6 134 135 132.3  BMI 27.09 26.46 27.75 27.39 27.06 27.27 26.72   Foot/eye exam completion dates Latest Ref Rng & Units 05/08/2019 12/07/2018  Eye Exam No Retinopathy - -  Foot Form Completion - Done Done

## 2019-10-30 NOTE — Telephone Encounter (Signed)
Last Visit: 10/03/2019 Next Visit: 03/12/2020 Labs: 10/03/2019 CBC is normal, glucose is mildly elevated probably not fasting.  Current Dose per office note on 10/03/2019: Methotrexate 3 tablets every 7 days Dx: Rheumatoid arthritis with rheumatoid factor of multiple sites without organ or systems involvement  Okay to refill per Dr. Estanislado Pandy

## 2019-11-14 ENCOUNTER — Other Ambulatory Visit: Payer: Self-pay | Admitting: Family Medicine

## 2019-11-14 DIAGNOSIS — M0579 Rheumatoid arthritis with rheumatoid factor of multiple sites without organ or systems involvement: Secondary | ICD-10-CM

## 2019-11-14 DIAGNOSIS — I10 Essential (primary) hypertension: Secondary | ICD-10-CM

## 2019-11-14 DIAGNOSIS — E559 Vitamin D deficiency, unspecified: Secondary | ICD-10-CM

## 2019-11-14 DIAGNOSIS — R7989 Other specified abnormal findings of blood chemistry: Secondary | ICD-10-CM

## 2019-11-14 DIAGNOSIS — E1169 Type 2 diabetes mellitus with other specified complication: Secondary | ICD-10-CM

## 2019-11-14 DIAGNOSIS — E669 Obesity, unspecified: Secondary | ICD-10-CM

## 2019-11-14 DIAGNOSIS — E785 Hyperlipidemia, unspecified: Secondary | ICD-10-CM

## 2019-11-22 DIAGNOSIS — Z0100 Encounter for examination of eyes and vision without abnormal findings: Secondary | ICD-10-CM | POA: Diagnosis not present

## 2019-11-22 DIAGNOSIS — H5200 Hypermetropia, unspecified eye: Secondary | ICD-10-CM | POA: Diagnosis not present

## 2019-11-22 LAB — HM DIABETES EYE EXAM

## 2019-12-16 ENCOUNTER — Other Ambulatory Visit: Payer: Self-pay | Admitting: Family Medicine

## 2019-12-16 ENCOUNTER — Other Ambulatory Visit: Payer: Self-pay | Admitting: Rheumatology

## 2019-12-18 NOTE — Telephone Encounter (Signed)
Last Visit: 10/03/2019 Next Visit: 03/12/2020  Current Dose per office note on 10/03/2019: prednisone 1 mg 4 tablets daily  Okay to refill per Dr. Estanislado Pandy

## 2020-02-02 DIAGNOSIS — M810 Age-related osteoporosis without current pathological fracture: Secondary | ICD-10-CM | POA: Diagnosis not present

## 2020-02-03 LAB — COMPREHENSIVE METABOLIC PANEL
ALT: 9 IU/L (ref 0–32)
AST: 15 IU/L (ref 0–40)
Albumin/Globulin Ratio: 1.8 (ref 1.2–2.2)
Albumin: 4.3 g/dL (ref 3.6–4.6)
Alkaline Phosphatase: 66 IU/L (ref 44–121)
BUN/Creatinine Ratio: 28 (ref 12–28)
BUN: 27 mg/dL (ref 8–27)
Bilirubin Total: 0.4 mg/dL (ref 0.0–1.2)
CO2: 23 mmol/L (ref 20–29)
Calcium: 10.2 mg/dL (ref 8.7–10.3)
Chloride: 106 mmol/L (ref 96–106)
Creatinine, Ser: 0.96 mg/dL (ref 0.57–1.00)
GFR calc Af Amer: 62 mL/min/{1.73_m2} (ref >59)
GFR calc non Af Amer: 54 mL/min/{1.73_m2} — ABNORMAL LOW (ref >59)
Globulin, Total: 2.4 g/dL (ref 1.5–4.5)
Glucose: 97 mg/dL (ref 65–99)
Potassium: 3.9 mmol/L (ref 3.5–5.2)
Sodium: 143 mmol/L (ref 134–144)
Total Protein: 6.7 g/dL (ref 6.0–8.5)

## 2020-02-03 LAB — TSH: TSH: 2.56 u[IU]/mL (ref 0.450–4.500)

## 2020-02-03 LAB — T4, FREE: Free T4: 1.29 ng/dL (ref 0.82–1.77)

## 2020-02-03 LAB — PTH, INTACT AND CALCIUM: PTH: 80 pg/mL — ABNORMAL HIGH (ref 15–65)

## 2020-02-03 LAB — MAGNESIUM: Magnesium: 2.2 mg/dL (ref 1.6–2.3)

## 2020-02-03 LAB — PHOSPHORUS: Phosphorus: 2.9 mg/dL — ABNORMAL LOW (ref 3.0–4.3)

## 2020-02-08 ENCOUNTER — Encounter: Payer: Self-pay | Admitting: "Endocrinology

## 2020-02-08 ENCOUNTER — Other Ambulatory Visit: Payer: Self-pay

## 2020-02-08 ENCOUNTER — Ambulatory Visit (INDEPENDENT_AMBULATORY_CARE_PROVIDER_SITE_OTHER): Payer: Medicare HMO | Admitting: "Endocrinology

## 2020-02-08 VITALS — BP 128/64 | HR 60 | Ht 59.0 in | Wt 135.2 lb

## 2020-02-08 DIAGNOSIS — M810 Age-related osteoporosis without current pathological fracture: Secondary | ICD-10-CM | POA: Diagnosis not present

## 2020-02-08 MED ORDER — ALENDRONATE SODIUM 70 MG PO TABS
ORAL_TABLET | ORAL | 12 refills | Status: DC
Start: 2020-02-08 — End: 2021-03-17

## 2020-02-08 NOTE — Progress Notes (Signed)
02/08/2020         Endocrinology follow-up note   Past Medical History:  Diagnosis Date  . Acute cystitis with hematuria 04/11/2011  . Anemia   . ARF (acute renal failure) ([place]) 12/14/2010  . CKD (chronic kidney disease), stage II    previously seen by nephrologist--- dr Florene Glen,  pt released per lov in epic 09-24-2017  . Diabetes mellitus, type II ([place])   . Diverticulosis of colon   . Essential hypertension, benign    followed by pcp   (pt had nuclear stress test (in epic under media) dated 05-25-2008 showed normal perfusion w/ no evidence ischemia, ef 83%  . History of bradycardia    previously seen by cardiologist, dr branch, pt released per lov 08-11-2018 in epic, bradycardia resolved  . History of sepsis    04-03-2019 hospital admission w/ urosepsis due to obstructive uropathy due to right ureter stone  . Hypokalemia   . Lipoma of colon    ascending  . Lumbar spondylosis   . Mixed hyperlipidemia   . Nephrolithiasis    per CT 04-03-2019 nonobstructive bilateral stones  . Ocular herpes   . Osteoarthritis    followed by dr deveshwar--- hands, shoulders, feet  . Osteoporosis   . Rheumatoid arthritis(714.0)    rheumotologist-- dr s. Estanislado Pandy---  multiple sites, treated with methotrexate/ prednisone  . Right nephrolithiasis-1.4cm Moderate right-sided urinary tract obstruction/hydronephrosis 04/03/2019  . Right ureteral calculus   . Seasonal allergies   . Sepsis ([place]) 04/03/2019  . Sepsis secondary to UTI /Nephrolithiasis with obstructive uropathy 04/03/2019  . SUI (stress urinary incontinence, female)   . Uterine fibroid    Past Surgical History:  Procedure Laterality Date  . BUNIONECTOMY Right 2012  . CATARACT EXTRACTION W/ INTRAOCULAR LENS  IMPLANT, BILATERAL  2009  . CHOLECYSTECTOMY  12/17/2010   Procedure: LAPAROSCOPIC CHOLECYSTECTOMY;  Surgeon: Donato Heinz;  Location: [facility];  Service: General;  Laterality:  N/A;  . COLONOSCOPY  05/22/2011   Procedure: COLONOSCOPY;  Surgeon: Rogene Houston, MD;  Location: [facility];  Service: Endoscopy;  Laterality: N/A;  1045  . CYSTOSCOPY WITH RETROGRADE PYELOGRAM, URETEROSCOPY AND STENT PLACEMENT Right 05/09/2019   Procedure: CYSTOSCOPY WITH RETROGRADE PYELOGRAM, URETEROSCOPY AND STENT PLACEMENT;  Surgeon: Robley Fries, MD;  Location[facility];  Service: Urology;  Laterality: Right;  1 HR  . CYSTOSCOPY WITH STENT PLACEMENT Right 04/03/2019   Procedure: CYSTOSCOPY, RIGHT RETROGRADE PYELOGRAM  WITH STENT PLACEMENT;  Surgeon: Robley Fries, MD;  Location: [facility];  Service: Urology;  Laterality: Right;  . EXCISION BONE CYST Right 2010   RIGHT SHOULDER  . GANGLION CYST EXCISION Left 06/02/2012   Procedure: Excision ganglion left dorsal wrist;  Surgeon: Sanjuana Kava, MD;  Location: [facility];  Service: Orthopedics;  Laterality: Left;  . HOLMIUM LASER APPLICATION Right [id number]   Procedure: HOLMIUM LASER APPLICATION;  Surgeon: Robley Fries, MD;  Location: [facility];  Service: Urology;  Laterality: Right;  . LUMBAR MICRODISCECTOMY  07-16-2000   @[facility]    L 2  -3  . POSTERIOR LAMINECTOMY / DECOMPRESSION LUMBAR SPINE  06-26-2009   @[facility]    w/ LATERAL MASS FUSION , L3 -- 5  . SHOULDER ARTHROSCOPY WITH DISTAL CLAVICLE RESECTION Right 01-10-2009   @[facility]    w/ DEBRIDEMENT AND EXCISION RECURRENT GANGLION CYST   Social History   Socioeconomic History  . Marital status: Widowed    Spouse name: Not on file  . Number of children: 1  .  Years of education: Not on file  . Highest education level: Not on file  Occupational History  . Occupation: Retired    Comment: [employer]  Tobacco Use  . Smoking status: Never Smoker  . Smokeless tobacco: Never Used  Vaping Use  . Vaping Use: Never used  Substance and Sexual Activity  . Alcohol use: No  . Drug use: Never  . Sexual activity: Not Currently    Birth control/protection:  Post-menopausal  Other Topics Concern  . Not on file  Social History Narrative   Husband passed away 04-27-2016. It has been financially harder to pay the bills since his passing, but she is making it. Has grandchildren around which makes it easier.    Social Determinants of Health   Financial Resource Strain: Low Risk   . Difficulty of Paying Living Expenses: Not hard at all  Food Insecurity: No Food Insecurity  . Worried About Charity fundraiser in the Last Year: Never true  . Ran Out of Food in the Last Year: Never true  Transportation Needs: No Transportation Needs  . Lack of Transportation (Medical): No  . Lack of Transportation (Non-Medical): No  Physical Activity: Insufficiently Active  . Days of Exercise per Week: 3 days  . Minutes of Exercise per Session: 30 min  Stress: No Stress Concern Present  . Feeling of Stress : Not at all  Social Connections: Moderately Integrated  . Frequency of Communication with Friends and Family: More than three times a week  . Frequency of Social Gatherings with Friends and Family: More than three times a week  . Attends Religious Services: More than 4 times per year  . Active Member of Clubs or Organizations: Yes  . Attends Archivist Meetings: 1 to 4 times per year  . Marital Status: Widowed   Outpatient Encounter Medications as of 02/08/2020  Medication Sig  . predniSONE (DELTASONE) 1 MG tablet TAKE 4 TABLETS (4 MG TOTAL) BY MOUTH DAILY WITH BREAKFAST.  Marland Kitchen acetaminophen (TYLENOL) 500 MG tablet Take 1,000 mg by mouth every 6 (six) hours as needed (for pain).   Marland Kitchen acyclovir (ZOVIRAX) 400 MG tablet TAKE 1 TABLET BY MOUTH TWICE A DAY  . alendronate (FOSAMAX) 70 MG tablet TAKE 1 TABLET BY MOUTH EVERY 7 DAYS. TAKE WITH A FULL GLASS OF WATER ON AN EMPTY STOMACH.  Marland Kitchen amLODipine (NORVASC) 5 MG tablet TAKE 1 TABLET BY MOUTH EVERY DAY  . azelastine (ASTELIN) 0.1 % nasal spray Place 2 sprays into both nostrils 2 (two) times daily. Use in  each nostril as directed (Patient taking differently: Place 2 sprays into both nostrils as needed for allergies. )  . ELDERBERRY PO Take 1 capsule by mouth daily. Gummy with vitamin c and zinc  . folic acid (FOLVITE) 1 MG tablet Take 1 tablet (1 mg total) by mouth daily.  Marland Kitchen KLOR-CON M20 20 MEQ tablet TAKE 1 TABLET (20 MEQ TOTAL) BY MOUTH DAILY.  . methotrexate (RHEUMATREX) 2.5 MG tablet TAKE 3 TABLETS BY MOUTH ONCE A WEEK. TAKE AS DIRECTED CAUTION:CHEMOTHERAPY. PROTECT FROM LIGHT  . montelukast (SINGULAIR) 10 MG tablet TAKE 1 TABLET BY MOUTH EVERYDAY AT BEDTIME  . Propylene Glycol (SYSTANE BALANCE OP) Place 1 drop into both eyes daily as needed (for dry eyes).   . rosuvastatin (CRESTOR) 5 MG tablet TAKE 1 TABLET BY MOUTH EVERY OTHER DAY  . UNABLE TO FIND Diabetic shoes x 1 inserts x 3  Dx e11.9  . UNABLE TO FIND Diabetic Shoes  x1 pair inserts x3 pair Dx code E11.9  . [DISCONTINUED] alendronate (FOSAMAX) 70 MG tablet TAKE 1 TABLET BY MOUTH EVERY 7 DAYS. TAKE WITH A FULL GLASS OF WATER ON AN EMPTY STOMACH.   No facility-administered encounter medications on file as of 02/08/2020.   ALLERGIES: Allergies  Allergen Reactions  . Aspirin Other (See Comments)    REACTION: burns stomach (uncoated)  . Sudafed [Pseudoephedrine Hcl] Other (See Comments)    Increased joint pain    VACCINATION STATUS: Immunization History  Administered Date(s) Administered  . Fluad Quad(high Dose 65+) 10/19/2019  . Influenza Split 11/07/2013  . Influenza Whole 10/28/2009, 09/17/2010  . Influenza, High Dose Seasonal PF 09/29/2016, 09/15/2018  . Influenza,inj,Quad PF,6+ Mos 09/03/2014  . Influenza-Unspecified 10/05/2013, 09/29/2016  . Moderna Sars-Covid-2 Vaccination 01/17/2019, 02/27/2019  . PPD Test 05/14/2014, 05/28/2015  . Pneumococcal Conjugate-13 08/03/2013  . Pneumococcal Polysaccharide-23 08/28/2009  . Tdap 04/29/2010     HPI   Stacy Meyers is 87 y.o. female who presents today for follow-up.  She  was seen in consultation for osteoporosis during her last visit. PMD:  Fayrene Helper, MD.  Patient was diagnosed with osteoporosis at approximate age of 77 years.   She denies fractures or falls. No dizziness/vertigo/orthostasis. -She came alone today-see notes from prior visit.    She was treated with Fosamax for at least 5 years.  Review of her last 3 bone density studies show that there has been progressive improvement in her bone density.  She continues to tolerate Fosamax 70 mg p.o. weekly. She also eats dairy and green, leafy, vegetables.   No weight bearing exercises.  -Review of her medical records indicate a course of hypercalcemia between January and September 2020 which has resolved based on her most recent labs.  No h/o hyperparathyroidism.  No history of thyrotoxicosis, she has no new complaints today. She has normal renal function.  Menopause was at 87 y/o.   -She did not report family history of osteoporosis.  She has well-controlled type 2 diabetes.  Because of her history of rheumatoid arthritis, she is on ongoing methotrexate treatment, and intermittent steroids.  -She reports losing 1 1/2 inches of height over time.  Review of Systems  Limited as above.  Objective:    BP 128/64   Pulse 60   Ht 4\' 11"  (1.499 m)   Wt 135 lb 3.2 oz (61.3 kg)   BMI 27.31 kg/m   Wt Readings from Last 3 Encounters:  02/08/20 135 lb 3.2 oz (61.3 kg)  10/24/19 134 lb 1.9 oz (60.8 kg)  10/19/19 131 lb (59.4 kg)    Physical Exam  Constitutional: + normal  weight for height, not in acute distress, normal state of mind, + walks with a cane.   CMP ( most recent) CMP     Component Value Date/Time   NA 143 02/02/2020 0855   K 3.9 02/02/2020 0855   CL 106 02/02/2020 0855   CO2 23 02/02/2020 0855   GLUCOSE 97 02/02/2020 0855   GLUCOSE 106 (H) 10/03/2019 1440   BUN 27 02/02/2020 0855   CREATININE 0.96 02/02/2020 0855   CREATININE 0.86 10/03/2019 1440   CALCIUM 10.2  02/02/2020 0855   PROT 6.7 02/02/2020 0855   ALBUMIN 4.3 02/02/2020 0855   AST 15 02/02/2020 0855   ALT 9 02/02/2020 0855   ALKPHOS 66 02/02/2020 0855   BILITOT 0.4 02/02/2020 0855   GFRNONAA 54 (L) 02/02/2020 0855   GFRNONAA 62 10/03/2019 1440   GFRAA  62 02/02/2020 0855   GFRAA 71 10/03/2019 1440     Diabetic Labs (most recent): Lab Results  Component Value Date   HGBA1C 6.0 (A) 10/24/2019   HGBA1C 6.3 (H) 03/02/2019   HGBA1C 6.3 (H) 12/05/2018     Lipid Panel ( most recent) Lipid Panel     Component Value Date/Time   CHOL 146 10/24/2019 1100   TRIG 128 10/24/2019 1100   HDL 56 10/24/2019 1100   CHOLHDL 2.6 10/24/2019 1100   VLDL 26 02/24/2016 0832   LDLCALC 69 10/24/2019 1100      Lab Results  Component Value Date   TSH 2.560 02/02/2020   TSH 0.98 10/24/2019   TSH 1.95 01/13/2018   TSH 2.354 01/24/2015   TSH 1.782 07/31/2013   TSH 0.915 04/16/2011   TSH 0.490 12/15/2010   TSH 1.426 03/03/2010   FREET4 1.29 02/02/2020   FREET4 0.97 12/15/2010          Assessment: 1. Osteoporosis  Plan: 1. Osteoporosis - likely postmenopausal    Review of her last 3 bone density studies showed that she is benefiting from treatment with alendronate a T score improving from -3.1 to  -2.6 on the right forearm radius.   We reviewed her DEXA scans together, and I explained that based on the T scores, she has an increased risk for fractures.  Her next bone density is in July 2023. -She continues to tolerate her current treatment with Fosamax 70 mg p.o. weekly.  She is advised to continue on same, side effects and precautions discussed with her.  If her next bone density shows inadequate response, she would be considered for Prolia intervention. - we reviewed her dietary and supplemental calcium and vitamin D intake, which I believe are adequate.   -I discussed with her all fall precautions and avoiding vigorous movements to reduce her risk of fracture.  She is encouraged to  continue to use her cane to ambulate.  - we discussed about maintaining a good amount of protein in her diet. -She will have labs including PTH/calcium, CMP, thyroid function test before her next visit in 6 months.  - I advised patient to maintain close follow up with Fayrene Helper, MD for primary care needs.     - Time spent on this patient care encounter:  20 minutes of which 50% was spent in  counseling and the rest reviewing  her current and  previous labs / studies and medications  doses and developing a plan for long term care. [name]  participated in the discussions, expressed understanding, and voiced agreement with the above plans.  All questions were answered to her satisfaction. she is encouraged to contact clinic should she have any questions or concerns prior to her return visit.  Follow up plan: Return in about 1 year (around 02/07/2021) for F/U with Pre-visit Labs.   Glade Lloyd, MD [facility] [address] [place], [place] [address] Phone: [phone]  Fax: [phone]     02/08/2020, 1:09 PM  This note was partially dictated with voice recognition software. Similar sounding words can be transcribed inadequately or may not  be corrected upon review.

## 2020-02-27 NOTE — Progress Notes (Signed)
Office Visit Note  Patient: Stacy Meyers             Date of Birth: 11/27/1932           MRN: [id number]             PCP: Fayrene Helper, MD Referring: Fayrene Helper, MD Visit Date: 03/12/2020 Occupation: @GUAROCC @  Subjective:  Currently being treated for UTI  History of Present Illness: Stacy Meyers is a 87 y.o. female with history of seropositive rheumatoid arthritis and osteoarthritis.  She is currently taking prednisone 4 mg daily, methotrexate 3 tablets by mouth once weekly, and folic acid 1 mg by mouth daily.  She has not missed any doses of these medications recently.  Patient reports that she recently developed increased urgency and dysuria and had a UA/urine culture on 02/29/20 consistent with a UTI.  Given her history of urosepsis she was prescribed a round of ampicillin.  Her urine was retested on 03/08/20 and the infection persisted based on the repeat UA and urine culture.  She was advised to continue on ampicillin as prescribed.  She will require a repeat UA after completing the course of ampicillin.  Patient reports that she has 2 doses which she will complete today and then follow up with Dr. Moshe Cipro again.  She denies any dysuria, frequency, or increased urgency at this time.  She states that she is continue to take methotrexate while on ampicillin.  She is due for her next methotrexate dose on Thursday. She denies any recent rheumatoid arthritis flares.  She experiences occasional discomfort in the posterior aspect of both knees.  She uses Biofreeze topically as needed for pain relief.  At times she has difficulty rising from a seated position.  She denies any joint swelling.  She denies any recent falls.  She has been using a cane to assist with ambulation.  She denies any other joint pain or joint swelling at this time.    Activities of Daily Living:  Patient reports morning stiffness for 0 minutes.   Patient Reports nocturnal pain.  Difficulty dressing/grooming:  Reports Difficulty climbing stairs: Reports Difficulty getting out of chair: Reports Difficulty using hands for taps, buttons, cutlery, and/or writing: Denies  Review of Systems  Constitutional: Positive for fatigue.  HENT: Positive for mouth dryness and nose dryness. Negative for mouth sores.   Eyes: Positive for itching. Negative for pain and dryness.  Respiratory: Negative for shortness of breath and difficulty breathing.   Cardiovascular: Negative for chest pain and palpitations.  Gastrointestinal: Negative for blood in stool, constipation and diarrhea.  Endocrine: Positive for increased urination.  Genitourinary: Negative for difficulty urinating and painful urination.  Musculoskeletal: Positive for arthralgias, joint pain, joint swelling, myalgias, muscle tenderness and myalgias. Negative for morning stiffness.  Skin: Negative for color change, rash and redness.  Allergic/Immunologic: Positive for susceptible to infections.  Neurological: Positive for dizziness, headaches and weakness. Negative for numbness and memory loss.  Hematological: Positive for bruising/bleeding tendency.  Psychiatric/Behavioral: Negative for confusion.    PMFS History:  Patient Active Problem List   Diagnosis Date Noted  . Frontal headache 02/29/2020  . Prediabetes 10/28/2019  . H/o Chronic heart failure with preserved ejection fraction (HFpEF) ([place]) 04/03/2019  . Umbilical hernia without obstruction and without gangrene 12/10/2018  . Fibroids 05/08/2016  . Spondylosis of lumbar region without myelopathy or radiculopathy 12/24/2015  . Solitary bone cyst of right shoulder 05/28/2015  . Right knee pain 05/28/2015  . Osteoarthritis  of both shoulders 05/28/2015  . Overweight (BMI 25.0-29.9) 04/28/2014  . Ganglion cyst of wrist 05/11/2012  . Systolic murmur 64/68/0321  . Acute cystitis with hematuria 04/11/2011  . Diastolic dysfunction 22/48/2500  . Seasonal allergies 08/28/2010  . Herpes simplex  virus (HSV) infection 09/16/2008  . Rheumatoid arthritis with rheumatoid factor of multiple sites without organ or systems involvement ([place]) 09/16/2008  . Hyperlipidemia LDL goal <100 05/23/2008  . Essential hypertension, benign 05/23/2008    Past Medical History:  Diagnosis Date  . Acute cystitis with hematuria 04/11/2011  . Anemia   . ARF (acute renal failure) ([place]) 12/14/2010  . CKD (chronic kidney disease), stage II    previously seen by nephrologist--- dr Florene Glen,  pt released per lov in epic 09-24-2017  . Diabetes mellitus, type II ([place])   . Diverticulosis of colon   . Essential hypertension, benign    followed by pcp   (pt had nuclear stress test (in epic under media) dated 05-25-2008 showed normal perfusion w/ no evidence ischemia, ef 83%  . History of bradycardia    previously seen by cardiologist, dr branch, pt released per lov 08-11-2018 in epic, bradycardia resolved  . History of sepsis    04-03-2019 hospital admission w/ urosepsis due to obstructive uropathy due to right ureter stone  . Hypokalemia   . Lipoma of colon    ascending  . Lumbar spondylosis   . Mixed hyperlipidemia   . Nephrolithiasis    per CT 04-03-2019 nonobstructive bilateral stones  . Ocular herpes   . Osteoarthritis    followed by dr deveshwar--- hands, shoulders, feet  . Osteoporosis   . Rheumatoid arthritis(714.0)    rheumotologist-- dr s. Estanislado Pandy---  multiple sites, treated with methotrexate/ prednisone  . Right nephrolithiasis-1.4cm Moderate right-sided urinary tract obstruction/hydronephrosis 04/03/2019  . Right ureteral calculus   . Seasonal allergies   . Sepsis ([place]) 04/03/2019  . Sepsis secondary to UTI /Nephrolithiasis with obstructive uropathy 04/03/2019  . SUI (stress urinary incontinence, female)   . Uterine fibroid     Family History  Problem Relation Age of Onset  . Stroke Mother   . Cancer Father   . Diabetes Sister   . Diabetes Sister   . Kidney failure Brother   . Kidney  failure Brother   . Pancreatic cancer Sister   . Kidney Stones Sister        Only one Kidney  . Arthritis Sister   . Heart disease Daughter    Past Surgical History:  Procedure Laterality Date  . BUNIONECTOMY Right 2012  . CATARACT EXTRACTION W/ INTRAOCULAR LENS  IMPLANT, BILATERAL  2009  . CHOLECYSTECTOMY  12/17/2010   Procedure: LAPAROSCOPIC CHOLECYSTECTOMY;  Surgeon: Donato Heinz;  Location: [facility];  Service: General;  Laterality: N/A;  . COLONOSCOPY  05/22/2011   Procedure: COLONOSCOPY;  Surgeon: Rogene Houston, MD;  Location: [facility];  Service: Endoscopy;  Laterality: N/A;  1045  . CYSTOSCOPY WITH RETROGRADE PYELOGRAM, URETEROSCOPY AND STENT PLACEMENT Right 05/09/2019   Procedure: CYSTOSCOPY WITH RETROGRADE PYELOGRAM, URETEROSCOPY AND STENT PLACEMENT;  Surgeon: Robley Fries, MD;  Location: [facility];  Service: Urology;  Laterality: Right;  1 HR  . CYSTOSCOPY WITH STENT PLACEMENT Right 04/03/2019   Procedure: CYSTOSCOPY, RIGHT RETROGRADE PYELOGRAM  WITH STENT PLACEMENT;  Surgeon: Robley Fries, MD;  Location: [facility];  Service: Urology;  Laterality: Right;  . EXCISION BONE CYST Right 2010   RIGHT SHOULDER  . GANGLION CYST EXCISION Left 06/02/2012  Procedure: Excision ganglion left dorsal wrist;  Surgeon: Sanjuana Kava, MD;  Location: [facility];  Service: Orthopedics;  Laterality: Left;  . HOLMIUM LASER APPLICATION Right 03/09/7423   Procedure: HOLMIUM LASER APPLICATION;  Surgeon: Robley Fries, MD;  Location: [facility];  Service: Urology;  Laterality: Right;  . LUMBAR MICRODISCECTOMY  07-16-2000   @[facility]    L 2  -3  . POSTERIOR LAMINECTOMY / DECOMPRESSION LUMBAR SPINE  06-26-2009   @[facility]    w/ LATERAL MASS FUSION , L3 -- 5  . SHOULDER ARTHROSCOPY WITH DISTAL CLAVICLE RESECTION Right 01-10-2009   @[facility]    w/ DEBRIDEMENT AND EXCISION RECURRENT GANGLION CYST   Social History   Social History Narrative   Husband passed away 28-Apr-2016. It  has been financially harder to pay the bills since his passing, but she is making it. Has grandchildren around which makes it easier.    Immunization History  Administered Date(s) Administered  . Fluad Quad(high Dose 65+) 10/19/2019  . Influenza Split 11/07/2013  . Influenza Whole 10/28/2009, 09/17/2010  . Influenza, High Dose Seasonal PF 09/29/2016, 09/15/2018  . Influenza,inj,Quad PF,6+ Mos 09/03/2014  . Influenza-Unspecified 10/05/2013, 09/29/2016  . Moderna Sars-Covid-2 Vaccination 01/17/2019, 02/27/2019, 10/30/2019  . PPD Test 05/14/2014, 05/28/2015  . Pneumococcal Conjugate-13 08/03/2013  . Pneumococcal Polysaccharide-23 08/28/2009  . Tdap 04/29/2010     Objective: Vital Signs: BP (!) 155/78 (BP Location: Right Arm, Patient Position: Sitting, Cuff Size: Normal)   Pulse (!) 49   Resp 13   Ht 4' 11.5" (1.511 m)   Wt 137 lb 3.2 oz (62.2 kg)   BMI 27.25 kg/m    Physical Exam Vitals and nursing note reviewed.  Constitutional:      Appearance: She is well-developed and well-nourished.  HENT:     Head: Normocephalic and atraumatic.  Eyes:     Extraocular Movements: EOM normal.     Conjunctiva/sclera: Conjunctivae normal.  Cardiovascular:     Pulses: Intact distal pulses.  Pulmonary:     Effort: Pulmonary effort is normal.  Abdominal:     Palpations: Abdomen is soft.  Musculoskeletal:     Cervical back: Normal range of motion.  Skin:    General: Skin is warm and dry.     Capillary Refill: Capillary refill takes less than 2 seconds.  Neurological:     Mental Status: She is alert and oriented to person, place, and time.  Psychiatric:        Mood and Affect: Mood and affect normal.        Behavior: Behavior normal.      Musculoskeletal Exam: C-spine good ROM.  Thoracic kyphosis noted.  Shoulder abduction to about 80 degrees bilaterally.  Elbow joints good ROM with no tenderness or inflammation.  Limited ROM of both wrist joints with no tenderness or inflammation.  No  tenderness or synovitis of MCPs or PIPs.  Hip joints difficult to assess in seated position.  Right knee has valgus deformity due to underlying osteoarthritis and rheumatoid arthritis.  Mild warmth of the right knee joint.  Left knee joint has good ROM with no warmth or effusion.  No tenderness over ankle joints.   CDAI Exam: CDAI Score: 0.6  Patient Global: 3 mm; Provider Global: 3 mm Swollen: 0 ; Tender: 0  Joint Exam 03/12/2020   No joint exam has been documented for this visit   There is currently no information documented on the homunculus. Go to the Rheumatology activity and complete the homunculus joint exam.  Investigation: No additional findings.  Imaging: No results found.  Recent Labs: Lab Results  Component Value Date   WBC 7.0 10/03/2019   HGB 12.8 10/03/2019   PLT 277 10/03/2019   NA 143 02/02/2020   K 3.9 02/02/2020   CL 106 02/02/2020   CO2 23 02/02/2020   GLUCOSE 97 02/02/2020   BUN 27 02/02/2020   CREATININE 0.96 02/02/2020   BILITOT 0.4 02/02/2020   ALKPHOS 66 02/02/2020   AST 15 02/02/2020   ALT 9 02/02/2020   PROT 6.7 02/02/2020   ALBUMIN 4.3 02/02/2020   CALCIUM 10.2 02/02/2020   GFRAA 62 02/02/2020    Speciality Comments: No specialty comments available.  Procedures:  No procedures performed Allergies: Aspirin and Sudafed [pseudoephedrine hcl]   Assessment / Plan:     Visit Diagnoses: Rheumatoid arthritis with rheumatoid factor of multiple sites without organ or systems involvement ([place]): She has no synovitis on exam. She has not had any recent rheumatoid arthritis flares. She has synovial thickening and limited range of motion of both wrist joints. No joint tenderness or synovitis of MCP or PIP joints. Valgus deformity of the right knee joint noted on exam. She experiences occasional discomfort on the posterior aspect of both knees. She uses a cane to assist with ambulation. She has not been experiencing any morning stiffness. She has been  taking methotrexate 3 tablets by mouth once weekly, folic acid 1 mg by mouth daily, and prednisone 4 mg by mouth daily. She has not missed any doses of these medications recently. She is currently on a course of ampicillin for a recent UTI x2 so she was advised to hold methotrexate on Thursday until she has been cleared by Dr. Moshe Cipro to resume. We discussed the importance of holding methotrexate anytime she has signs or symptoms of an infection and to resume once infection has completely cleared. She voiced understanding. She was advised to notify us if she develops increased joint pain or joint swelling. She will follow-up in the office in 5 months.  High risk medication use - Methotrexate 3 tablets by mouth every 7 days, folic acid 1 mg daily, and prednisone 1 mg 4 tablets daily.  CBC updated on 10/03/19.  CMP updated on 02/02/20.  We will recheck CBC and CMP today.  Her next lab work will be due in June and every 3 months to monitor for drug toxicity.  Standing orders for CBC and CMP remain in place.  - Plan: COMPLETE METABOLIC PANEL WITH GFR, CBC with Differential/Platelet Patient was advised to hold methotrexate if she develops signs or symptoms of infection and to resume once infection has completely cleared. She has received 3 moderna covid-19 vaccine doses. Discussed that ACR and the CDC  recommend a fourth vaccine 5 to 6 months after her third dose since she is considered immunocompromised while taking methotrexate. She was advised to hold methotrexate 1 week after receiving the fourth dose. She voiced understanding.  Primary osteoarthritis of both hands - X-rays are consistent with severe osteoarthritis and rheumatoid arthritis overlap. Synovial thickening and limited range of motion of both wrist joints noted. PIP and DIP thickening consistent with osteoarthritis of both hands noted. Joint protection and muscle strengthening were discussed.  Primary osteoarthritis of both shoulders: She has chronic  discomfort and stiffness in both shoulder joints. Limited abduction to about 80 degrees bilaterally.  Primary osteoarthritis of both knees: She experiences occasional discomfort in the posterior aspect of both knee joints. She has difficulty climbing steps as  well as rising from a seated position. Valgus deformity of the right knee joint was noted. Mild warmth but no effusion of the right knee was noted on exam. Left knee has good range of motion with no warmth or effusion. She has not had any recent falls. She continues to use a cane to assist with ambulation. She uses Biofreeze topically as needed for pain relief.  Primary osteoarthritis of both feet - X-rays are consistent with severe rheumatoid arthritis and osteoarthritis overlap. She is not experiencing any discomfort in her feet at this time. She wears proper fitting shoes.  Spondylosis of lumbar region without myelopathy or radiculopathy: She is not experiencing any discomfort in her lower back currently. No midline spinal tenderness or SI joint tenderness was noted. No symptoms of radiculopathy.  Age-related osteoporosis without current pathological fracture - She takes Fosamax 70 mg 1 tablet by mouth once weekly. she remains on long-term prednisone 4 mg by mouth daily. DEXA ordered on 07/31/19 by Dr. Moshe Cipro: Left femur Neck:  Osteopenia T-score -2.0, BMD 0.764 g/cm2. She has not had any recent falls or fractures. She continues to walk with a cane to assist with ambulation.  Acute cystitis without hematuria: Patient has a history of urosepsis.  She was recently diagnosed with an acute UTI (urine culture on 02/29/20-e. Coli) and she was started on a course of ampicillin. She had a repeat UA and urine culture on 03/08/2020 which remain consistent with a UTI secondary to E. coli. She was advised to continue on ampicillin as prescribed. Her symptoms of dysuria, frequency, and increased urgency have resolved. She will be taking her last 2 doses of  ampicillin today. She will be following up with her PCP to recheck UA and urine culture later this week.  She did not hold methotrexate while taking ampicillin. We discussed the importance of holding methotrexate anytime she develops signs or symptoms of an infection and to resume once the infection has completely cleared. She was advised to have clearance by Dr. Moshe Cipro before resuming methotrexate on Thursday.  Other medical conditions are listed as follows  Systolic murmur  Essential hypertension  Type 2 diabetes, diet controlled ([place])  Orders: Orders Placed This Encounter  Procedures  . COMPLETE METABOLIC PANEL WITH GFR  . CBC with Differential/Platelet   No orders of the defined types were placed in this encounter.     Follow-Up Instructions: Return in about 5 months (around 08/12/2020) for Rheumatoid arthritis, Osteoarthritis.   Ofilia Neas, PA-C  Note - This record has been created using Dragon software.  Chart creation errors have been sought, but may not always  have been located. Such creation errors do not reflect on  the standard of medical care.

## 2020-02-28 ENCOUNTER — Telehealth (INDEPENDENT_AMBULATORY_CARE_PROVIDER_SITE_OTHER): Payer: Medicare HMO | Admitting: Family Medicine

## 2020-02-28 ENCOUNTER — Other Ambulatory Visit: Payer: Self-pay

## 2020-02-28 DIAGNOSIS — E785 Hyperlipidemia, unspecified: Secondary | ICD-10-CM

## 2020-02-28 DIAGNOSIS — M0579 Rheumatoid arthritis with rheumatoid factor of multiple sites without organ or systems involvement: Secondary | ICD-10-CM | POA: Diagnosis not present

## 2020-02-28 DIAGNOSIS — Z1231 Encounter for screening mammogram for malignant neoplasm of breast: Secondary | ICD-10-CM

## 2020-02-28 DIAGNOSIS — R519 Headache, unspecified: Secondary | ICD-10-CM

## 2020-02-28 DIAGNOSIS — R7303 Prediabetes: Secondary | ICD-10-CM | POA: Diagnosis not present

## 2020-02-28 DIAGNOSIS — I10 Essential (primary) hypertension: Secondary | ICD-10-CM

## 2020-02-28 MED ORDER — FOLIC ACID 1 MG PO TABS: 1.0000 mg | ORAL_TABLET | Freq: Every day | ORAL | 3 refills | Status: DC

## 2020-02-28 NOTE — Progress Notes (Signed)
Virtual Visit via Telephone Note  I connected with [name] on 02/28/20 at 10:40 AM EST by telephone and verified that I am speaking with the correct person using two identifiers.  Location: Patient: home Provider: office   I discussed the limitations, risks, security and privacy concerns of performing an evaluation and management service by telephone and the availability of in person appointments. I also discussed with the patient that there may be a patient responsible charge related to this service. The patient expressed understanding and agreed to proceed.   History of Present Illness: Headache x 7 to 10 days intermittent, mainly at night, no sinus pressure fever or drainage, no anxiety, relieved by tylenol 2 tabs and goes away Denies recent fever or chills. Denies sinus pressure, nasal congestion, ear pain or sore throat. Denies chest congestion, productive cough or wheezing. Denies chest pains, palpitations and leg swelling Denies abdominal pain, nausea, vomiting,diarrhea or constipation.   Denies dysuria, frequency, hesitancy or incontinence. Chronic  joint pain, swelling and limitation in mobility.well controlled  Denies  seizures, numbness, or tingling. Denies depression, anxiety or insomnia. Denies skin break down or rash.       Observations/Objective: There were no vitals taken for this visit. Good communication with no confusion and intact memory. Alert and oriented x 3 No signs of respiratory distress during speech    Assessment and Plan: Frontal headache Responds to tylenol,likely related to changes in environment as in allergy mediated  Essential hypertension, benign DASH diet and commitment to daily physical activity for a minimum of 30 minutes discussed and encouraged, as a part of hypertension management. The importance of attaining a healthy weight is also discussed.  BP/Weight 02/29/2020 02/08/2020 10/24/2019 10/19/2019 10/03/2019 08/08/2019 7/62/8315   Systolic BP 176 160 737 106 269 485 462  Diastolic BP 64 64 70 70 64 54 72  Wt. (Lbs) 132.4 135.2 134.12 131 137.4 135.6 134  BMI 26.74 27.31 27.09 26.46 27.75 27.39 27.06   Controlled, no change in medication     Hyperlipidemia LDL goal <100 Hyperlipidemia:Low fat diet discussed and encouraged.   Lipid Panel  Lab Results  Component Value Date   CHOL 146 10/24/2019   HDL 56 10/24/2019   LDLCALC 69 10/24/2019   TRIG 128 10/24/2019   CHOLHDL 2.6 10/24/2019  Updated lab needed at/ before next visit.      Rheumatoid arthritis with rheumatoid factor of multiple sites without organ or systems involvement ([place]) Controlled and managed by Rheumatology  Prediabetes Patient educated about the importance of limiting  Carbohydrate intake , the need to commit to daily physical activity for a minimum of 30 minutes , and to commit weight loss. The fact that changes in all these areas will reduce or eliminate all together the development of diabetes is stressed.  Updated lab needed at/ before next visit.  Diabetic Labs Latest Ref Rng & Units 02/02/2020 10/24/2019 10/03/2019 05/09/2019 04/06/2019  HbA1c 4.0 - 5.6 % - 6.0(A) - - -  Microalbumin mg/dL - - - - -  Micro/Creat Ratio <30 mcg/mg creat - - - - -  Chol <200 mg/dL - 146 - - -  HDL > OR = 50 mg/dL - 56 - - -  Calc LDL mg/dL (calc) - 69 - - -  Triglycerides <150 mg/dL - 128 - - -  Creatinine 0.57 - 1.00 mg/dL 0.96 - 0.86 1.00 0.90   BP/Weight 02/29/2020 02/08/2020 10/24/2019 10/19/2019 10/03/2019 08/08/2019 07/08/5007  Systolic BP 381 829 937 169 144 126  417  Diastolic BP 64 64 70 70 64 54 72  Wt. (Lbs) 132.4 135.2 134.12 131 137.4 135.6 134  BMI 26.74 27.31 27.09 26.46 27.75 27.39 27.06   Foot/eye exam completion dates Latest Ref Rng & Units 11/22/2019 05/08/2019  Eye Exam No Retinopathy No Retinopathy -  Foot Form Completion - - Done        Follow Up Instructions:    I discussed the assessment and treatment plan with the  patient. The patient was provided an opportunity to ask questions and all were answered. The patient agreed with the plan and demonstrated an understanding of the instructions.   The patient was advised to call back or seek an in-person evaluation if the symptoms worsen or if the condition fails to improve as anticipated.  I provided 24  minutes of non-face-to-face time during this encounter.   [name], MD

## 2020-02-28 NOTE — Patient Instructions (Addendum)
F/U first week in September,flu vaccine at visit, call if you need me before  Please schedule mammogram due 06/30 or after at checkout  Fasting lipid, cmp and EGFR last week in August  Please be careful not to fall again    Fall Prevention in the Home, Adult Falls can cause injuries and can happen to people of all ages. There are many things you can do to make your home safe and to help prevent falls. Ask for help when making these changes. What actions can I take to prevent falls? General Instructions  Use good lighting in all rooms. Replace any light bulbs that burn out.  Turn on the lights in dark areas. Use night-lights.  Keep items that you use often in easy-to-reach places. Lower the shelves around your home if needed.  Set up your furniture so you have a clear path. Avoid moving your furniture around.  Do not have throw rugs or other things on the floor that can make you trip.  Avoid walking on wet floors.  If any of your floors are uneven, fix them.  Add color or contrast paint or tape to clearly mark and help you see: ? Grab bars or handrails. ? First and last steps of staircases. ? Where the edge of each step is.  If you use a stepladder: ? Make sure that it is fully opened. Do not climb a closed stepladder. ? Make sure the sides of the stepladder are locked in place. ? Ask someone to hold the stepladder while you use it.  Know where your pets are when moving through your home. What can I do in the bathroom?  Keep the floor dry. Clean up any water on the floor right away.  Remove soap buildup in the tub or shower.  Use nonskid mats or decals on the floor of the tub or shower.  Attach bath mats securely with double-sided, nonslip rug tape.  If you need to sit down in the shower, use a plastic, nonslip stool.  Install grab bars by the toilet and in the tub and shower. Do not use towel bars as grab bars.      What can I do in the bedroom?  Make sure  that you have a light by your bed that is easy to reach.  Do not use any sheets or blankets for your bed that hang to the floor.  Have a firm chair with side arms that you can use for support when you get dressed. What can I do in the kitchen?  Clean up any spills right away.  If you need to reach something above you, use a step stool with a grab bar.  Keep electrical cords out of the way.  Do not use floor polish or wax that makes floors slippery. What can I do with my stairs?  Do not leave any items on the stairs.  Make sure that you have a light switch at the top and the bottom of the stairs.  Make sure that there are handrails on both sides of the stairs. Fix handrails that are broken or loose.  Install nonslip stair treads on all your stairs.  Avoid having throw rugs at the top or bottom of the stairs.  Choose a carpet that does not hide the edge of the steps on the stairs.  Check carpeting to make sure that it is firmly attached to the stairs. Fix carpet that is loose or worn. What can I do on the  outside of my home?  Use bright outdoor lighting.  Fix the edges of walkways and driveways and fix any cracks.  Remove anything that might make you trip as you walk through a door, such as a raised step or threshold.  Trim any bushes or trees on paths to your home.  Check to see if handrails are loose or broken and that both sides of all steps have handrails.  Install guardrails along the edges of any raised decks and porches.  Clear paths of anything that can make you trip, such as tools or rocks.  Have leaves, snow, or ice cleared regularly.  Use sand or salt on paths during winter.  Clean up any spills in your garage right away. This includes grease or oil spills. What other actions can I take?  Wear shoes that: ? Have a low heel. Do not wear high heels. ? Have rubber bottoms. ? Feel good on your feet and fit well. ? Are closed at the toe. Do not wear  open-toe sandals.  Use tools that help you move around if needed. These include: ? Canes. ? Walkers. ? Scooters. ? Crutches.  Review your medicines with your doctor. Some medicines can make you feel dizzy. This can increase your chance of falling. Ask your doctor what else you can do to help prevent falls. Where to find more information  Centers for Disease Control and Prevention, STEAD[employer]: http://www.wolf.info/  National Institute on Aging: http://kim-miller.com/ Contact a doctor if:  You are afraid of falling at home.  You feel weak, drowsy, or dizzy at home.  You fall at home. Summary  There are many simple things that you can do to make your home safe and to help prevent falls.  Ways to make your home safe include removing things that can make you trip and installing grab bars in the bathroom.  Ask for help when making these changes in your home. This information is not intended to replace advice given to you by your health care provider. Make sure you discuss any questions you have with your health care provider. Document Revised: 07/26/2019 Document Reviewed: 07/26/2019 Elsevier Patient Education  [place].

## 2020-02-29 ENCOUNTER — Other Ambulatory Visit: Payer: Self-pay

## 2020-02-29 ENCOUNTER — Telehealth (INDEPENDENT_AMBULATORY_CARE_PROVIDER_SITE_OTHER): Payer: Medicare HMO | Admitting: Family Medicine

## 2020-02-29 ENCOUNTER — Encounter: Payer: Self-pay | Admitting: Family Medicine

## 2020-02-29 VITALS — BP 128/64 | Ht 59.0 in | Wt 132.4 lb

## 2020-02-29 DIAGNOSIS — R519 Headache, unspecified: Secondary | ICD-10-CM | POA: Insufficient documentation

## 2020-02-29 DIAGNOSIS — R300 Dysuria: Secondary | ICD-10-CM | POA: Diagnosis not present

## 2020-02-29 DIAGNOSIS — N3001 Acute cystitis with hematuria: Secondary | ICD-10-CM | POA: Diagnosis not present

## 2020-02-29 DIAGNOSIS — R7303 Prediabetes: Secondary | ICD-10-CM

## 2020-02-29 LAB — POCT URINALYSIS DIP (CLINITEK)
Bilirubin, UA: NEGATIVE
Glucose, UA: NEGATIVE mg/dL
Ketones, POC UA: NEGATIVE mg/dL
Nitrite, UA: NEGATIVE
Spec Grav, UA: 1.015 (ref 1.010–1.025)
Urobilinogen, UA: 0.2 E.U./dL
pH, UA: 6 (ref 5.0–8.0)

## 2020-02-29 MED ORDER — AMPICILLIN 500 MG PO CAPS: 500.0000 mg | ORAL_CAPSULE | Freq: Three times a day (TID) | ORAL | 0 refills | Status: DC

## 2020-02-29 NOTE — Progress Notes (Signed)
Virtual Visit via Telephone Note  I connected with Stacy Meyers on 02/29/20 at  1:20 PM EST by telephone and verified that I am speaking with the correct person using two identifiers.  Location: Patient: home Provider: office   I discussed the limitations, risks, security and privacy concerns of performing an evaluation and management service by telephone and the availability of in person appointments. I also discussed with the patient that there may be a patient responsible charge related to this service. The patient expressed understanding and agreed to proceed.   History of Present Illness: 1 day h/o frequency, and bad odor this am had to go to the bathroom multiple times with small amounts of urine with pain. No fever, chills or flank pain , has been hospitalized with urosepsis in the past , so she is very cautious   Observations/Objective: BP 128/64   Ht 4\' 11"  (1.499 m)   Wt 132 lb 6.4 oz (60.1 kg)   BMI 26.74 kg/m  Good communication with no confusion and intact memory. Alert and oriented x 3 No signs of respiratory distress during speech    Assessment and Plan:  Acute cystitis with hematuria Symptomatic with abn UAand past h/o urosepsis, ampicillin prescribed and will follow c/s   Follow Up Instructions:    I discussed the assessment and treatment plan with the patient. The patient was provided an opportunity to ask questions and all were answered. The patient agreed with the plan and demonstrated an understanding of the instructions.   The patient was advised to call back or seek an in-person evaluation if the symptoms worsen or if the condition fails to improve as anticipated.  I provided 10 minutes of non-face-to-face time during this encounter.   [name], MD

## 2020-02-29 NOTE — Assessment & Plan Note (Signed)
DASH diet and commitment to daily physical activity for a minimum of 30 minutes discussed and encouraged, as a part of hypertension management. The importance of attaining a healthy weight is also discussed.  BP/Weight 02/29/2020 02/08/2020 10/24/2019 10/19/2019 10/03/2019 08/08/2019 9/52/8413  Systolic BP 244 010 272 536 644 034 742  Diastolic BP 64 64 70 70 64 54 72  Wt. (Lbs) 132.4 135.2 134.12 131 137.4 135.6 134  BMI 26.74 27.31 27.09 26.46 27.75 27.39 27.06   Controlled, no change in medication

## 2020-02-29 NOTE — Assessment & Plan Note (Signed)
Patient educated about the importance of limiting  Carbohydrate intake , the need to commit to daily physical activity for a minimum of 30 minutes , and to commit weight loss. The fact that changes in all these areas will reduce or eliminate all together the development of diabetes is stressed.  Updated lab needed at/ before next visit.  Diabetic Labs Latest Ref Rng & Units 02/02/2020 10/24/2019 10/03/2019 05/09/2019 04/06/2019  HbA1c 4.0 - 5.6 % - 6.0(A) - - -  Microalbumin mg/dL - - - - -  Micro/Creat Ratio <30 mcg/mg creat - - - - -  Chol <200 mg/dL - 146 - - -  HDL > OR = 50 mg/dL - 56 - - -  Calc LDL mg/dL (calc) - 69 - - -  Triglycerides <150 mg/dL - 128 - - -  Creatinine 0.57 - 1.00 mg/dL 0.96 - 0.86 1.00 0.90   BP/Weight 02/29/2020 02/08/2020 10/24/2019 10/19/2019 10/03/2019 08/08/2019 7/61/6073  Systolic BP 710 626 948 546 270 350 093  Diastolic BP 64 64 70 70 64 54 72  Wt. (Lbs) 132.4 135.2 134.12 131 137.4 135.6 134  BMI 26.74 27.31 27.09 26.46 27.75 27.39 27.06   Foot/eye exam completion dates Latest Ref Rng & Units 11/22/2019 05/08/2019  Eye Exam No Retinopathy No Retinopathy -  Foot Form Completion - - Done

## 2020-02-29 NOTE — Assessment & Plan Note (Signed)
Hyperlipidemia:Low fat diet discussed and encouraged.   Lipid Panel  Lab Results  Component Value Date   CHOL 146 10/24/2019   HDL 56 10/24/2019   LDLCALC 69 10/24/2019   TRIG 128 10/24/2019   CHOLHDL 2.6 10/24/2019     Updated lab needed at/ before next visit.  

## 2020-02-29 NOTE — Assessment & Plan Note (Signed)
Controlled and managed by Rheumatology

## 2020-02-29 NOTE — Patient Instructions (Signed)
F/U as before , call if you need me sooner  Five day course of ampicillin is prescribed for a presumed UTI, since you are symptomatic  We will call next monay with final report  Please make sure that you drink 64 ouces water daily and empty your bladder often to help to clear theinfection  Thanks for choosing [facility], we consider it a privelige to serve you.

## 2020-02-29 NOTE — Assessment & Plan Note (Signed)
Responds to tylenol,likely related to changes in environment as in allergy mediated

## 2020-03-01 NOTE — Assessment & Plan Note (Signed)
Symptomatic with abn UAand past h/o urosepsis, ampicillin prescribed and will follow c/s

## 2020-03-03 LAB — URINE CULTURE

## 2020-03-06 ENCOUNTER — Other Ambulatory Visit: Payer: Self-pay | Admitting: Family Medicine

## 2020-03-06 ENCOUNTER — Other Ambulatory Visit: Payer: Self-pay | Admitting: Rheumatology

## 2020-03-06 DIAGNOSIS — E1169 Type 2 diabetes mellitus with other specified complication: Secondary | ICD-10-CM

## 2020-03-06 DIAGNOSIS — E785 Hyperlipidemia, unspecified: Secondary | ICD-10-CM

## 2020-03-06 DIAGNOSIS — M0579 Rheumatoid arthritis with rheumatoid factor of multiple sites without organ or systems involvement: Secondary | ICD-10-CM

## 2020-03-06 DIAGNOSIS — I10 Essential (primary) hypertension: Secondary | ICD-10-CM

## 2020-03-06 DIAGNOSIS — R7989 Other specified abnormal findings of blood chemistry: Secondary | ICD-10-CM

## 2020-03-06 DIAGNOSIS — E559 Vitamin D deficiency, unspecified: Secondary | ICD-10-CM

## 2020-03-06 DIAGNOSIS — E669 Obesity, unspecified: Secondary | ICD-10-CM

## 2020-03-06 NOTE — Telephone Encounter (Signed)
Last Visit: 10/03/2019 Next Visit: 03/12/3020  Current Dose per office note on 10/03/2019, prednisone 1 mg 4 tablets daily Dx:  Rheumatoid arthritis with rheumatoid factor of multiple sites without organ or systems involvement  Last Fill: 12/18/2019  Okay to refill Prednisone?

## 2020-03-07 ENCOUNTER — Telehealth: Payer: Self-pay

## 2020-03-07 DIAGNOSIS — R7303 Prediabetes: Secondary | ICD-10-CM | POA: Diagnosis not present

## 2020-03-07 NOTE — Telephone Encounter (Signed)
OK to rept urine c/s pls ask if pt hasany symptoms

## 2020-03-07 NOTE — Telephone Encounter (Signed)
Called and left message to call back.

## 2020-03-07 NOTE — Telephone Encounter (Signed)
Patient daughter called [name] asking if she could do another blood test and uti check just to make sure her urine is all clear since has finished her antibiotics .  Please contact daughter at [phone].

## 2020-03-07 NOTE — Telephone Encounter (Signed)
He may re submit CCUA with reflex c/S, no indication for blood draw

## 2020-03-07 NOTE — Telephone Encounter (Signed)
Grandaughter is wanting to be absolutely sure the infection is gone where the patient had a similar thing happen before and ended up getting sepsis. She wants to bring her to leave a urine culture and also blood work that detects sepsis to make sure that's not happening again. Please advise

## 2020-03-08 ENCOUNTER — Other Ambulatory Visit (INDEPENDENT_AMBULATORY_CARE_PROVIDER_SITE_OTHER): Payer: Medicare HMO

## 2020-03-08 ENCOUNTER — Other Ambulatory Visit: Payer: Self-pay

## 2020-03-08 ENCOUNTER — Other Ambulatory Visit: Payer: Self-pay | Admitting: Family Medicine

## 2020-03-08 DIAGNOSIS — N3001 Acute cystitis with hematuria: Secondary | ICD-10-CM | POA: Diagnosis not present

## 2020-03-08 DIAGNOSIS — N3000 Acute cystitis without hematuria: Secondary | ICD-10-CM

## 2020-03-08 LAB — POCT URINALYSIS DIP (CLINITEK)
Bilirubin, UA: NEGATIVE
Glucose, UA: NEGATIVE mg/dL
Ketones, POC UA: NEGATIVE mg/dL
Nitrite, UA: NEGATIVE
POC PROTEIN,UA: NEGATIVE
Spec Grav, UA: 1.015 (ref 1.010–1.025)
Urobilinogen, UA: 0.2 E.U./dL
pH, UA: 7 (ref 5.0–8.0)

## 2020-03-08 MED ORDER — AMPICILLIN 500 MG PO CAPS: 500.0000 mg | ORAL_CAPSULE | Freq: Three times a day (TID) | ORAL | 0 refills | Status: DC

## 2020-03-08 NOTE — Telephone Encounter (Signed)
Left message to bring Stacy Meyers in for CCUA  with reflex culture

## 2020-03-09 LAB — MICROALBUMIN / CREATININE URINE RATIO
Creatinine, Urine: 15.6 mg/dL
Microalb/Creat Ratio: 167 mg/g creat — ABNORMAL HIGH (ref 0–29)
Microalbumin, Urine: 26 ug/mL

## 2020-03-11 LAB — URINE CULTURE

## 2020-03-12 ENCOUNTER — Encounter: Payer: Self-pay | Admitting: Physician Assistant

## 2020-03-12 ENCOUNTER — Other Ambulatory Visit: Payer: Self-pay

## 2020-03-12 ENCOUNTER — Ambulatory Visit: Payer: Medicare HMO | Admitting: Physician Assistant

## 2020-03-12 VITALS — BP 155/78 | HR 49 | Resp 13 | Ht 59.5 in | Wt 137.2 lb

## 2020-03-12 DIAGNOSIS — M47816 Spondylosis without myelopathy or radiculopathy, lumbar region: Secondary | ICD-10-CM | POA: Diagnosis not present

## 2020-03-12 DIAGNOSIS — Z79899 Other long term (current) drug therapy: Secondary | ICD-10-CM

## 2020-03-12 DIAGNOSIS — M19071 Primary osteoarthritis, right ankle and foot: Secondary | ICD-10-CM | POA: Diagnosis not present

## 2020-03-12 DIAGNOSIS — M19011 Primary osteoarthritis, right shoulder: Secondary | ICD-10-CM

## 2020-03-12 DIAGNOSIS — M19012 Primary osteoarthritis, left shoulder: Secondary | ICD-10-CM

## 2020-03-12 DIAGNOSIS — M170 Bilateral primary osteoarthritis of knee: Secondary | ICD-10-CM | POA: Diagnosis not present

## 2020-03-12 DIAGNOSIS — I10 Essential (primary) hypertension: Secondary | ICD-10-CM

## 2020-03-12 DIAGNOSIS — M0579 Rheumatoid arthritis with rheumatoid factor of multiple sites without organ or systems involvement: Secondary | ICD-10-CM

## 2020-03-12 DIAGNOSIS — M19042 Primary osteoarthritis, left hand: Secondary | ICD-10-CM

## 2020-03-12 DIAGNOSIS — N3000 Acute cystitis without hematuria: Secondary | ICD-10-CM | POA: Diagnosis not present

## 2020-03-12 DIAGNOSIS — E119 Type 2 diabetes mellitus without complications: Secondary | ICD-10-CM

## 2020-03-12 DIAGNOSIS — M810 Age-related osteoporosis without current pathological fracture: Secondary | ICD-10-CM

## 2020-03-12 DIAGNOSIS — M19072 Primary osteoarthritis, left ankle and foot: Secondary | ICD-10-CM

## 2020-03-12 DIAGNOSIS — R011 Cardiac murmur, unspecified: Secondary | ICD-10-CM

## 2020-03-12 DIAGNOSIS — M19041 Primary osteoarthritis, right hand: Secondary | ICD-10-CM

## 2020-03-12 NOTE — Patient Instructions (Signed)
Please stop methotrexate if you develop signs or symptoms of an infection and resume once the infection has completely cleared     Standing Labs We placed an order today for your standing lab work.   Please have your standing labs drawn in June and every 3 months   If possible, please have your labs drawn 2 weeks prior to your appointment so that the provider can discuss your results at your appointment.  We have open lab daily Monday through Thursday from 1:30-4:30 PM and Friday from 1:30-4:00 PM at the office of Dr. Bo Merino, [facility].   Please be advised, all patients with office appointments requiring lab work will take precedents over walk-in lab work.  If possible, please come for your lab work on Monday and Friday afternoons, as you may experience shorter wait times. The office is located at [address], [place], [place], [place] [address] No appointment is necessary.   Labs are drawn by [facility]. Please bring your co-pay at the time of your lab draw.  You may receive a bill from [facility] for your lab work.  If you wish to have your labs drawn at another location, please call the office 24 hours in advance to send orders.  If you have any questions regarding directions or hours of operation,  please call [phone].   As a reminder, please drink plenty of water prior to coming for your lab work. Thanks!     COVID-19 vaccine recommendations:   COVID-19 vaccine is recommended for everyone (unless you are allergic to a vaccine component), even if you are on a medication that suppresses your immune system.   If you are on Methotrexate, hold the medication for 1 week after each vaccine. Hold Methotrexate for 2 weeks after the single dose COVID-19 vaccine.   Do not take Tylenol or any anti-inflammatory medications (NSAIDs) 24 hours prior to the COVID-19 vaccination.   There is no direct evidence about the efficacy of the COVID-19 vaccine in individuals  who are on medications that suppress the immune system.   Even if you are fully vaccinated, and you are on any medications that suppress your immune system, please continue to wear a mask, maintain at least six feet social distance and practice hand hygiene.   If you develop a COVID-19 infection, please contact your PCP or our office to determine if you need monoclonal antibody infusion.    Please see the following web sites for updated information.   [email or web address]

## 2020-03-13 LAB — COMPLETE METABOLIC PANEL WITH GFR
AG Ratio: 1.8 (calc) (ref 1.0–2.5)
ALT: 8 U/L (ref 6–29)
AST: 14 U/L (ref 10–35)
Albumin: 4.3 g/dL (ref 3.6–5.1)
Alkaline phosphatase (APISO): 62 U/L (ref 37–153)
BUN: 15 mg/dL (ref 7–25)
CO2: 28 mmol/L (ref 20–32)
Calcium: 10.7 mg/dL — ABNORMAL HIGH (ref 8.6–10.4)
Chloride: 105 mmol/L (ref 98–110)
Creat: 0.87 mg/dL (ref 0.60–0.88)
GFR, Est African American: 70 mL/min/{1.73_m2} (ref >=60)
GFR, Est Non African American: 60 mL/min/{1.73_m2} (ref >=60)
Globulin: 2.4 g/dL (calc) (ref 1.9–3.7)
Glucose, Bld: 96 mg/dL (ref 65–99)
Potassium: 4.5 mmol/L (ref 3.5–5.3)
Sodium: 143 mmol/L (ref 135–146)
Total Bilirubin: 0.5 mg/dL (ref 0.2–1.2)
Total Protein: 6.7 g/dL (ref 6.1–8.1)

## 2020-03-13 LAB — CBC WITH DIFFERENTIAL/PLATELET
Absolute Monocytes: 338 cells/uL (ref 200–950)
Basophils Absolute: 50 cells/uL (ref 0–200)
Basophils Relative: 0.7 %
Eosinophils Absolute: 101 cells/uL (ref 15–500)
Eosinophils Relative: 1.4 %
HCT: 40.1 % (ref 35.0–45.0)
Hemoglobin: 13.4 g/dL (ref 11.7–15.5)
Lymphs Abs: 1181 cells/uL (ref 850–3900)
MCH: 31 pg (ref 27.0–33.0)
MCHC: 33.4 g/dL (ref 32.0–36.0)
MCV: 92.8 fL (ref 80.0–100.0)
MPV: 9 fL (ref 7.5–12.5)
Monocytes Relative: 4.7 %
Neutro Abs: 5530 cells/uL (ref 1500–7800)
Neutrophils Relative %: 76.8 %
Platelets: 283 10*3/uL (ref 140–400)
RBC: 4.32 10*6/uL (ref 3.80–5.10)
RDW: 13.2 % (ref 11.0–15.0)
Total Lymphocyte: 16.4 %
WBC: 7.2 10*3/uL (ref 3.8–10.8)

## 2020-03-13 NOTE — Progress Notes (Signed)
Calcium is elevated-10.7. Please advise the patient to avoid a calcium supplement.  Rest of CMP WNL.  CBC WNL.    Please forward lab work to PCP.

## 2020-03-14 ENCOUNTER — Other Ambulatory Visit: Payer: Self-pay

## 2020-03-14 DIAGNOSIS — N3001 Acute cystitis with hematuria: Secondary | ICD-10-CM

## 2020-03-17 LAB — URINE CULTURE

## 2020-03-18 ENCOUNTER — Other Ambulatory Visit: Payer: Self-pay | Admitting: Family Medicine

## 2020-03-18 DIAGNOSIS — N390 Urinary tract infection, site not specified: Secondary | ICD-10-CM

## 2020-03-19 ENCOUNTER — Other Ambulatory Visit: Payer: Self-pay

## 2020-03-19 MED ORDER — NITROFURANTOIN MONOHYD MACRO 100 MG PO CAPS
100.0000 mg | ORAL_CAPSULE | Freq: Two times a day (BID) | ORAL | 0 refills | Status: DC
Start: 2020-03-19 — End: 2020-05-01

## 2020-03-26 ENCOUNTER — Telehealth: Payer: Self-pay

## 2020-03-26 ENCOUNTER — Other Ambulatory Visit: Payer: Self-pay

## 2020-03-26 DIAGNOSIS — N3000 Acute cystitis without hematuria: Secondary | ICD-10-CM

## 2020-03-26 NOTE — Telephone Encounter (Signed)
Patient aware.

## 2020-03-26 NOTE — Telephone Encounter (Signed)
Yes, she can come in for a urine culture at the lab. We don't usually do it but she has had a persistent uti

## 2020-03-26 NOTE — Telephone Encounter (Signed)
Patient called she states she finished her antibiotic last night for a uti and is wanting to know if Dr Moshe Cipro wants her to come do a urine to be sure its gone I told her I dont think we usually do that unless she is still having issues ph# [phone]

## 2020-03-27 DIAGNOSIS — M1990 Unspecified osteoarthritis, unspecified site: Secondary | ICD-10-CM | POA: Diagnosis not present

## 2020-03-27 DIAGNOSIS — E663 Overweight: Secondary | ICD-10-CM | POA: Diagnosis not present

## 2020-03-27 DIAGNOSIS — M069 Rheumatoid arthritis, unspecified: Secondary | ICD-10-CM | POA: Diagnosis not present

## 2020-03-27 DIAGNOSIS — Z008 Encounter for other general examination: Secondary | ICD-10-CM | POA: Diagnosis not present

## 2020-03-27 DIAGNOSIS — H04129 Dry eye syndrome of unspecified lacrimal gland: Secondary | ICD-10-CM | POA: Diagnosis not present

## 2020-03-27 DIAGNOSIS — G8929 Other chronic pain: Secondary | ICD-10-CM | POA: Diagnosis not present

## 2020-03-27 DIAGNOSIS — M810 Age-related osteoporosis without current pathological fracture: Secondary | ICD-10-CM | POA: Diagnosis not present

## 2020-03-27 DIAGNOSIS — I10 Essential (primary) hypertension: Secondary | ICD-10-CM | POA: Diagnosis not present

## 2020-03-27 DIAGNOSIS — Z6828 Body mass index (BMI) 28.0-28.9, adult: Secondary | ICD-10-CM | POA: Diagnosis not present

## 2020-03-27 DIAGNOSIS — J302 Other seasonal allergic rhinitis: Secondary | ICD-10-CM | POA: Diagnosis not present

## 2020-03-27 DIAGNOSIS — B009 Herpesviral infection, unspecified: Secondary | ICD-10-CM | POA: Diagnosis not present

## 2020-03-28 ENCOUNTER — Telehealth: Payer: Self-pay

## 2020-03-28 LAB — URINE CULTURE

## 2020-03-28 NOTE — Telephone Encounter (Signed)
Patients grand daughter calling they dropped off a urine sample earlier in the week and have never gotten results from that cb# [phone]

## 2020-03-28 NOTE — Telephone Encounter (Signed)
Aware there is no infection

## 2020-04-23 ENCOUNTER — Ambulatory Visit: Payer: Medicare HMO | Admitting: Family Medicine

## 2020-04-25 ENCOUNTER — Other Ambulatory Visit: Payer: Self-pay | Admitting: Family Medicine

## 2020-04-25 ENCOUNTER — Other Ambulatory Visit: Payer: Self-pay | Admitting: Physician Assistant

## 2020-04-25 ENCOUNTER — Other Ambulatory Visit: Payer: Self-pay

## 2020-04-25 ENCOUNTER — Telehealth: Payer: Self-pay

## 2020-04-25 DIAGNOSIS — I10 Essential (primary) hypertension: Secondary | ICD-10-CM

## 2020-04-25 DIAGNOSIS — E785 Hyperlipidemia, unspecified: Secondary | ICD-10-CM

## 2020-04-25 DIAGNOSIS — E559 Vitamin D deficiency, unspecified: Secondary | ICD-10-CM

## 2020-04-25 DIAGNOSIS — E1169 Type 2 diabetes mellitus with other specified complication: Secondary | ICD-10-CM

## 2020-04-25 DIAGNOSIS — M0579 Rheumatoid arthritis with rheumatoid factor of multiple sites without organ or systems involvement: Secondary | ICD-10-CM

## 2020-04-25 DIAGNOSIS — E669 Obesity, unspecified: Secondary | ICD-10-CM

## 2020-04-25 DIAGNOSIS — R7989 Other specified abnormal findings of blood chemistry: Secondary | ICD-10-CM

## 2020-04-25 MED ORDER — POTASSIUM CHLORIDE CRYS ER 20 MEQ PO TBCR: 20.0000 meq | EXTENDED_RELEASE_TABLET | Freq: Every day | ORAL | 1 refills | Status: DC

## 2020-04-25 NOTE — Telephone Encounter (Signed)
Patient called need med refills:  KLOR-CON M20 20 MEQ tablet  ( she is completely out of this medicine)  methotrexate (RHEUMATREX) 2.5 MG tablet  (she has 2 more weeks left on this one)  Patient said she will come in for her appt next week see Dr Moshe Cipro.  Pharmacy: [facility]

## 2020-04-25 NOTE — Telephone Encounter (Signed)
Klor Con refilled. Pt informed that she will need to call her rheumatologist for the other medication.

## 2020-05-01 ENCOUNTER — Ambulatory Visit (INDEPENDENT_AMBULATORY_CARE_PROVIDER_SITE_OTHER): Payer: Medicare HMO | Admitting: Family Medicine

## 2020-05-01 ENCOUNTER — Encounter: Payer: Self-pay | Admitting: Family Medicine

## 2020-05-01 ENCOUNTER — Other Ambulatory Visit: Payer: Self-pay

## 2020-05-01 VITALS — BP 168/72 | HR 66 | Temp 97.4°F | Ht 59.0 in | Wt 140.0 lb

## 2020-05-01 DIAGNOSIS — I10 Essential (primary) hypertension: Secondary | ICD-10-CM | POA: Diagnosis not present

## 2020-05-01 DIAGNOSIS — E785 Hyperlipidemia, unspecified: Secondary | ICD-10-CM | POA: Diagnosis not present

## 2020-05-01 DIAGNOSIS — M0579 Rheumatoid arthritis with rheumatoid factor of multiple sites without organ or systems involvement: Secondary | ICD-10-CM

## 2020-05-01 DIAGNOSIS — I5032 Chronic diastolic (congestive) heart failure: Secondary | ICD-10-CM

## 2020-05-01 DIAGNOSIS — E663 Overweight: Secondary | ICD-10-CM

## 2020-05-01 DIAGNOSIS — R300 Dysuria: Secondary | ICD-10-CM | POA: Diagnosis not present

## 2020-05-01 LAB — POCT URINALYSIS DIPSTICK
Bilirubin, UA: NEGATIVE
Blood, UA: NEGATIVE
Glucose, UA: NEGATIVE
Ketones, UA: NEGATIVE
Leukocytes, UA: NEGATIVE
Nitrite, UA: NEGATIVE
Protein, UA: NEGATIVE
Spec Grav, UA: 1.01 (ref 1.010–1.025)
Urobilinogen, UA: 0.2 E.U./dL
pH, UA: 6 (ref 5.0–8.0)

## 2020-05-01 NOTE — Patient Instructions (Addendum)
F/U in 4 weeks, re evaluate blood pressure, please bring ALL medications that you are taking to the visit  Amlodipine 5 mg once daily at the same time each day is your blood pressure medication   MMSE today to evaluate memory loss.  CCUA today to check for infection, if abnormal we will send for culture  Be careful not to fall  Thanks for choosing [facility], we consider it a privelige to serve you.

## 2020-05-02 ENCOUNTER — Ambulatory Visit (INDEPENDENT_AMBULATORY_CARE_PROVIDER_SITE_OTHER): Payer: Medicare HMO | Admitting: Urology

## 2020-05-02 ENCOUNTER — Encounter: Payer: Self-pay | Admitting: Urology

## 2020-05-02 VITALS — BP 146/85 | HR 52 | Temp 97.8°F | Ht 59.0 in | Wt 137.0 lb

## 2020-05-02 DIAGNOSIS — N3020 Other chronic cystitis without hematuria: Secondary | ICD-10-CM | POA: Diagnosis not present

## 2020-05-02 DIAGNOSIS — N3946 Mixed incontinence: Secondary | ICD-10-CM

## 2020-05-02 DIAGNOSIS — Z87442 Personal history of urinary calculi: Secondary | ICD-10-CM

## 2020-05-02 LAB — URINALYSIS, ROUTINE W REFLEX MICROSCOPIC
Bilirubin, UA: NEGATIVE
Glucose, UA: NEGATIVE
Ketones, UA: NEGATIVE
Nitrite, UA: NEGATIVE
Specific Gravity, UA: 1.02 (ref 1.005–1.030)
Urobilinogen, Ur: 0.2 mg/dL (ref 0.2–1.0)
pH, UA: 6.5 (ref 5.0–7.5)

## 2020-05-02 LAB — MICROSCOPIC EXAMINATION

## 2020-05-02 NOTE — Progress Notes (Signed)
Urological Symptom Review  Patient is experiencing the following symptoms: Frequent urination Hard to postpone urination Get up at night to urinate Urinary tract infection   Review of Systems  Gastrointestinal (upper)  : Negative for upper GI symptoms  Gastrointestinal (lower) : Negative for lower GI symptoms  Constitutional : Negative for symptoms  Skin: Negative for skin symptoms  Eyes: Negative for eye symptoms  Ear/Nose/Throat : Negative for Ear/Nose/Throat symptoms  Hematologic/Lymphatic: Negative for Hematologic/Lymphatic symptoms  Cardiovascular : Negative for cardiovascular symptoms  Respiratory : Negative for respiratory symptoms  Endocrine: Negative for endocrine symptoms  Musculoskeletal: Back pain Joint pain  Neurological: Negative for neurological symptoms  Psychologic: Negative for psychiatric symptoms

## 2020-05-02 NOTE — Progress Notes (Signed)
Subjective: 1. Chronic cystitis   2. History of nephrolithiasis   3. Mixed incontinence      Consult requested by Dr. [name].   Stacy Meyers is an 87 yo female who is sent by Dr. Moshe Cipro for recurrent e. Coli UTI's with multiple positive cultures since 2/21 with the last on 03/14/20.   She had e. Coli sepsis in 3/21.   She had a 1.4cm right proximal stone with obstruction in 3/21 she was stented and then had ureteroscopy on 05/09/19.  She had a tethered stent and remembers pulling it out but she never followed up with Dr. Claudia Desanctis after the procedure.  She has had no follow up imaging.  She currently has no dysuria but she has nocturia and frequency with urgency with UUI.  She has had no hematuria. She has occasional intermittent right low back pain.  Her urine was clear on 05/01/20.  ROS:  ROS  Allergies  Allergen Reactions  . Aspirin Other (See Comments)    REACTION: burns stomach (uncoated)  . Sudafed [Pseudoephedrine Hcl] Other (See Comments)    Increased joint pain    Past Medical History:  Diagnosis Date  . Acid reflux   . Acute cystitis with hematuria 04/11/2011  . Anemia   . ARF (acute renal failure) ([place]) 12/14/2010  . Arthritis   . CKD (chronic kidney disease), stage II    previously seen by nephrologist--- dr Florene Glen,  pt released per lov in epic 09-24-2017  . Diabetes mellitus, type II ([place])   . Diverticulosis of colon   . Essential hypertension, benign    followed by pcp   (pt had nuclear stress test (in epic under media) dated 05-25-2008 showed normal perfusion w/ no evidence ischemia, ef 83%  . Heart disease   . History of bradycardia    previously seen by cardiologist, dr branch, pt released per lov 08-11-2018 in epic, bradycardia resolved  . History of sepsis    04-03-2019 hospital admission w/ urosepsis due to obstructive uropathy due to right ureter stone  . Hypokalemia   . Lipoma of colon    ascending  . Lumbar spondylosis   . Mixed hyperlipidemia   .  Nephrolithiasis    per CT 04-03-2019 nonobstructive bilateral stones  . Ocular herpes   . Osteoarthritis    followed by dr deveshwar--- hands, shoulders, feet  . Osteoporosis   . Rheumatoid arthritis(714.0)    rheumotologist-- dr s. Estanislado Pandy---  multiple sites, treated with methotrexate/ prednisone  . Right nephrolithiasis-1.4cm Moderate right-sided urinary tract obstruction/hydronephrosis 04/03/2019  . Right ureteral calculus   . Seasonal allergies   . Sepsis ([place]) 04/03/2019  . Sepsis secondary to UTI /Nephrolithiasis with obstructive uropathy 04/03/2019  . SUI (stress urinary incontinence, female)   . Uterine fibroid     Past Surgical History:  Procedure Laterality Date  . BUNIONECTOMY Right 2012  . CATARACT EXTRACTION W/ INTRAOCULAR LENS  IMPLANT, BILATERAL  2009  . CHOLECYSTECTOMY  12/17/2010   Procedure: LAPAROSCOPIC CHOLECYSTECTOMY;  Surgeon: Donato Heinz;  Location: [facility];  Service: General;  Laterality: N/A;  . COLONOSCOPY  05/22/2011   Procedure: COLONOSCOPY;  Surgeon: Rogene Houston, MD;  Location: [facility];  Service: Endoscopy;  Laterality: N/A;  1045  . CYSTOSCOPY WITH RETROGRADE PYELOGRAM, URETEROSCOPY AND STENT PLACEMENT Right 05/09/2019   Procedure: CYSTOSCOPY WITH RETROGRADE PYELOGRAM, URETEROSCOPY AND STENT PLACEMENT;  Surgeon: Robley Fries, MD;  Location: [facility];  Service: Urology;  Laterality: Right;  1 HR  .  CYSTOSCOPY WITH STENT PLACEMENT Right 04/03/2019   Procedure: CYSTOSCOPY, RIGHT RETROGRADE PYELOGRAM  WITH STENT PLACEMENT;  Surgeon: Robley Fries, MD;  Location: [facility];  Service: Urology;  Laterality: Right;  . EXCISION BONE CYST Right 05/06/08   RIGHT SHOULDER  . GANGLION CYST EXCISION Left 06/02/2012   Procedure: Excision ganglion left dorsal wrist;  Surgeon: Sanjuana Kava, MD;  Location: [facility];  Service: Orthopedics;  Laterality: Left;  . HOLMIUM LASER APPLICATION Right 1/0/1751   Procedure: HOLMIUM LASER APPLICATION;   Surgeon: Robley Fries, MD;  Location: [facility];  Service: Urology;  Laterality: Right;  . kidney stones    . LUMBAR MICRODISCECTOMY  07-16-2000   @[facility]    L 2  -3  . POSTERIOR LAMINECTOMY / DECOMPRESSION LUMBAR SPINE  06-26-2009   @[facility]    w/ LATERAL MASS FUSION , L3 -- 5  . SHOULDER ARTHROSCOPY WITH DISTAL CLAVICLE RESECTION Right 01-10-2009   @[facility]    w/ DEBRIDEMENT AND EXCISION RECURRENT GANGLION CYST    Social History   Socioeconomic History  . Marital status: Widowed    Spouse name: Not on file  . Number of children: 1  . Years of education: Not on file  . Highest education level: Not on file  Occupational History  . Occupation: Retired    Comment: [employer]  Tobacco Use  . Smoking status: Never Smoker  . Smokeless tobacco: Never Used  Vaping Use  . Vaping Use: Never used  Substance and Sexual Activity  . Alcohol use: No  . Drug use: Never  . Sexual activity: Not Currently    Birth control/protection: Post-menopausal  Other Topics Concern  . Not on file  Social History Narrative   Husband passed away May 06, 2016. It has been financially harder to pay the bills since his passing, but she is making it. Has grandchildren around which makes it easier.    Social Determinants of Health   Financial Resource Strain: Low Risk   . Difficulty of Paying Living Expenses: Not hard at all  Food Insecurity: No Food Insecurity  . Worried About Charity fundraiser in the Last Year: Never true  . Ran Out of Food in the Last Year: Never true  Transportation Needs: No Transportation Needs  . Lack of Transportation (Medical): No  . Lack of Transportation (Non-Medical): No  Physical Activity: Insufficiently Active  . Days of Exercise per Week: 3 days  . Minutes of Exercise per Session: 30 min  Stress: No Stress Concern Present  . Feeling of Stress : Not at all  Social Connections: Moderately Integrated  . Frequency of Communication with Friends and Family: More than  three times a week  . Frequency of Social Gatherings with Friends and Family: More than three times a week  . Attends Religious Services: More than 4 times per year  . Active Member of Clubs or Organizations: Yes  . Attends Archivist Meetings: 1 to 4 times per year  . Marital Status: Widowed  Intimate Partner Violence: Not on file    Family History  Problem Relation Age of Onset  . Stroke Mother   . Cancer Father   . Diabetes Sister   . Diabetes Sister   . Kidney failure Brother   . Kidney failure Brother   . Pancreatic cancer Sister   . Kidney Stones Sister        Only one Kidney  . Arthritis Sister   . Heart disease Daughter  Anti-infectives: Anti-infectives (From admission, onward)   None      Current Outpatient Medications  Medication Sig Dispense Refill  . acetaminophen (TYLENOL) 500 MG tablet Take 1,000 mg by mouth every 6 (six) hours as needed (for pain).     Marland Kitchen acyclovir (ZOVIRAX) 400 MG tablet TAKE 1 TABLET BY MOUTH TWICE A DAY 180 tablet 1  . alendronate (FOSAMAX) 70 MG tablet TAKE 1 TABLET BY MOUTH EVERY 7 DAYS. TAKE WITH A FULL GLASS OF WATER ON AN EMPTY STOMACH. 12 tablet 12  . amLODipine (NORVASC) 5 MG tablet TAKE 1 TABLET BY MOUTH EVERY DAY 90 tablet 0  . folic acid (FOLVITE) 1 MG tablet Take 1 tablet (1 mg total) by mouth daily. 90 tablet 3  . methotrexate (RHEUMATREX) 2.5 MG tablet TAKE 3 TABLETS BY MOUTH ONCE A WEEK. TAKE AS DIRECTED CAUTION:CHEMOTHERAPY. PROTECT FROM LIGHT 36 tablet 0  . montelukast (SINGULAIR) 10 MG tablet TAKE 1 TABLET BY MOUTH EVERYDAY AT BEDTIME 90 tablet 1  . potassium chloride SA (KLOR-CON M20) 20 MEQ tablet Take 1 tablet (20 mEq total) by mouth daily. 90 tablet 1  . predniSONE (DELTASONE) 1 MG tablet TAKE 4 TABLETS (4 MG TOTAL) BY MOUTH DAILY WITH BREAKFAST. 360 tablet 0  . Propylene Glycol (SYSTANE BALANCE OP) Place 1 drop into both eyes daily as needed (for dry eyes).     . rosuvastatin (CRESTOR) 5 MG tablet TAKE 1  TABLET BY MOUTH EVERY OTHER DAY 45 tablet 0  . UNABLE TO FIND Diabetic shoes x 1 inserts x 3  Dx e11.9 1 each 0  . UNABLE TO FIND Diabetic Shoes x1 pair inserts x3 pair Dx code E11.9 1 each 0   No current facility-administered medications for this visit.     Objective: Vital signs in last 24 hours: BP (!) 146/85   Pulse (!) 52   Temp 97.8 F (36.6 C)   Ht 4\' 11"  (1.499 m)   Wt 137 lb (62.1 kg)   BMI 27.67 kg/m   Intake/Output from previous day: No intake/output data recorded. Intake/Output this shift: @IOTHISSHIFT @   Physical Exam Vitals reviewed.  Constitutional:      Appearance: Normal appearance. She is obese.  Cardiovascular:     Rate and Rhythm: Normal rate and regular rhythm.     Heart sounds: Normal heart sounds.  Pulmonary:     Effort: Pulmonary effort is normal. No respiratory distress.     Breath sounds: Normal breath sounds.  Abdominal:     General: Abdomen is flat.     Palpations: Abdomen is soft.     Tenderness: There is abdominal tenderness (mild diffuse). There is no right CVA tenderness or left CVA tenderness.  Neurological:     Mental Status: She is alert.     Lab Results:  Results for orders placed or performed in visit on 05/02/20 (from the past 24 hour(s))  Urinalysis, Routine w reflex microscopic     Status: Abnormal   Collection Time: 05/02/20  2:44 PM  Result Value Ref Range   Specific Gravity, UA 1.020 1.005 - 1.030   pH, UA 6.5 5.0 - 7.5   Color, UA Yellow Yellow   Appearance Ur Clear Clear   Leukocytes,UA Trace (A) Negative   Protein,UA Trace (A) Negative/Trace   Glucose, UA Negative Negative   Ketones, UA Negative Negative   RBC, UA 1+ (A) Negative   Bilirubin, UA Negative Negative   Urobilinogen, Ur 0.2 0.2 - 1.0 mg/dL   Nitrite, UA  Negative Negative   Microscopic Examination See below:    Narrative   Performed at:  [facility] [address] [place], [place]  [id number] Lab Director: Mina Marble MT,  Phone:  [phone]  Microscopic Examination     Status: Abnormal   Collection Time: 05/02/20  2:44 PM   Urine  Result Value Ref Range   WBC, UA 6-10 (A) 0 - 5 /hpf   RBC 3-10 (A) 0 - 2 /hpf   Epithelial Cells (non renal) 0-10 0 - 10 /hpf   Renal Epithel, UA 0-10 (A) None seen /hpf   Bacteria, UA Few None seen/Few   Narrative   Performed at:  [facility] [address], [place], [place]  [id number] Lab Director: [place], Phone:  [phone]    BMET No results for input(s): NA, K, CL, CO2, GLUCOSE, BUN, CREATININE, CALCIUM in the last 72 hours. PT/INR No results for input(s): LABPROT, INR in the last 72 hours. ABG No results for input(s): PHART, HCO3 in the last 72 hours.  Invalid input(s): PCO2, PO2  Studies/Results: No results found.   Assessment/Plan: Recurrent UTI with prior sepsis.   Her UA was clear on 4/28 but has some WBC and RBC's today and her last infection was 6 weeks ago.   I will get a culture today and have her return in 6 weeks to reassess.  History of stones.   I will get a KUB and renal US to assess for recurrent stones.    Mixed incontinence.   I have recommended she elevated her legs in the afternoon to try to reduce the nocturia.   I will consider additional therapy at f/u.   No orders of the defined types were placed in this encounter.    Orders Placed This Encounter  Procedures  . Urine Culture  . Microscopic Examination  . US RENAL    Standing Status:   Future    Standing Expiration Date:   06/01/2020    Order Specific Question:   Reason for Exam (SYMPTOM  OR DIAGNOSIS REQUIRED)    Answer:   history of stones with recurrent UTI    Order Specific Question:   Preferred imaging location?    Answer:   [facility]    Standing Status:   Future    Standing Expiration Date:   06/01/2020    Order Specific Question:   Reason for Exam (SYMPTOM  OR DIAGNOSIS REQUIRED)    Answer:   history of stones with  recurrent UTI's.    Order Specific Question:   Preferred imaging location?    Answer:   [facility]    Order Specific Question:   Radiology Contrast Protocol - do NOT remove file path    Answer:   [email or web address]  . Urinalysis, Routine w reflex microscopic     Return in about 6 weeks (around 06/13/2020).    CC: Dr. [name].     Irine Seal 05/02/2020 [phone]

## 2020-05-03 ENCOUNTER — Encounter: Payer: Self-pay | Admitting: Family Medicine

## 2020-05-03 DIAGNOSIS — R300 Dysuria: Secondary | ICD-10-CM | POA: Insufficient documentation

## 2020-05-03 NOTE — Assessment & Plan Note (Signed)
Symptomatic mildly wih h/o recurrent/ resistant UTI, CCUA in office normal, has urology appt in am

## 2020-05-03 NOTE — Assessment & Plan Note (Signed)
Elevated at visit, re eval in 4 weeks DASH diet and commitment to daily physical activity for a minimum of 30 minutes discussed and encouraged, as a part of hypertension management. The importance of attaining a healthy weight is also discussed.  BP/Weight 05/02/2020 05/01/2020 03/12/2020 02/29/2020 02/08/2020 10/24/2019 29/92/4268  Systolic BP 341 962 229 798 921 194 174  Diastolic BP 85 72 78 64 64 70 70  Wt. (Lbs) 137 140 137.2 132.4 135.2 134.12 131  BMI 27.67 28.28 27.25 26.74 27.31 27.09 26.46

## 2020-05-03 NOTE — Assessment & Plan Note (Signed)
Hyperlipidemia:Low fat diet discussed and encouraged.   Lipid Panel  Lab Results  Component Value Date   CHOL 146 10/24/2019   HDL 56 10/24/2019   LDLCALC 69 10/24/2019   TRIG 128 10/24/2019   CHOLHDL 2.6 10/24/2019   Controlled, no change in medication Updated lab needed at/ before next visit.

## 2020-05-03 NOTE — Assessment & Plan Note (Signed)
Stable and controlled, managed by Rheumatology

## 2020-05-03 NOTE — Assessment & Plan Note (Signed)
  Patient re-educated about  the importance of commitment to a  minimum of 150 minutes of exercise per week as able.  The importance of healthy food choices with portion control discussed, as well as eating regularly and within a 12 hour window most days. The need to choose "clean , green" food 50 to 75% of the time is discussed, as well as to make water the primary drink and set a goal of 64 ounces water daily.    Weight /BMI 05/02/2020 05/01/2020 03/12/2020  WEIGHT 137 lb 140 lb 137 lb 3.2 oz  HEIGHT 4\' 11"  4\' 11"  4' 11.5"  BMI 27.67 kg/m2 28.28 kg/m2 27.25 kg/m2

## 2020-05-03 NOTE — Progress Notes (Signed)
Stacy Meyers     MRN: [id number]      DOB: 03-May-1932   HPI Stacy Meyers is here for follow up and re-evaluation of chronic medical conditions, medication management and review of any available recent lab and radiology data.  Preventive health is updated, specifically  Cancer screening and Immunization.   Questions or concerns regarding consultations or procedures which the PT has had in the interim are  addressed. The PT denies any adverse reactions to current medications since the last visit.  There are no new concerns.  There are no specific complaints   ROS Denies recent fever or chills. Denies sinus pressure, nasal congestion, ear pain or sore throat. Denies chest congestion, productive cough or wheezing. Denies chest pains, palpitations and leg swelling Denies abdominal pain, nausea, vomiting,diarrhea or constipation.   C/o mild  Dysuria, denies  frequency, hesitancy or incontinence. Denies joint pain, swelling and limitation in mobility. Denies headaches, seizures, numbness, or tingling. Denies depression, anxiety or insomnia. Denies skin break down or rash.   PE  BP (!) 168/72   Pulse 66   Temp (!) 97.4 F (36.3 C) (Temporal)   Ht 4\' 11"  (1.499 m)   Wt 140 lb (63.5 kg)   SpO2 96%   BMI 28.28 kg/m   Patient alert and oriented and in no cardiopulmonary distress.  HEENT: No facial asymmetry, EOMI,     Neck supple .  Chest: Clear to auscultation bilaterally.  CVS: S1, S2 no murmurs, no S3.Regular rate.  ABD: Soft non tender.   Ext: No edema  MS: decreased  ROM spine, shoulders, hips and knees.Joit deformity of small joints of fingers  Skin: Intact, no ulcerations or rash noted.  Psych: Good eye contact, normal affect. Memory intact not anxious or depressed appearing.  CNS: CN 2-12 intact, power,  normal throughout.no focal deficits noted.   Assessment & Plan  Essential hypertension, benign Elevated at visit, re eval in 4 weeks DASH diet and commitment  to daily physical activity for a minimum of 30 minutes discussed and encouraged, as a part of hypertension management. The importance of attaining a healthy weight is also discussed.  BP/Weight 05/02/2020 05/01/2020 03/12/2020 02/29/2020 02/08/2020 10/24/2019 78/29/5621  Systolic BP 308 657 846 962 952 841 324  Diastolic BP 85 72 78 64 64 70 70  Wt. (Lbs) 137 140 137.2 132.4 135.2 134.12 131  BMI 27.67 28.28 27.25 26.74 27.31 27.09 26.46       Overweight (BMI 25.0-29.9)  Patient re-educated about  the importance of commitment to a  minimum of 150 minutes of exercise per week as able.  The importance of healthy food choices with portion control discussed, as well as eating regularly and within a 12 hour window most days. The need to choose "clean , green" food 50 to 75% of the time is discussed, as well as to make water the primary drink and set a goal of 64 ounces water daily.    Weight /BMI 05/02/2020 05/01/2020 03/12/2020  WEIGHT 137 lb 140 lb 137 lb 3.2 oz  HEIGHT 4\' 11"  4\' 11"  4' 11.5"  BMI 27.67 kg/m2 28.28 kg/m2 27.25 kg/m2      Hyperlipidemia LDL goal <100 Hyperlipidemia:Low fat diet discussed and encouraged.   Lipid Panel  Lab Results  Component Value Date   CHOL 146 10/24/2019   HDL 56 10/24/2019   LDLCALC 69 10/24/2019   TRIG 128 10/24/2019   CHOLHDL 2.6 10/24/2019   Controlled, no change in medication Updated  lab needed at/ before next visit.     Rheumatoid arthritis with rheumatoid factor of multiple sites without organ or systems involvement ([place]) Stable and controlled, managed by Rheumatology  H/o Chronic heart failure with preserved ejection fraction (HFpEF) ([place]) Stable, no s/s of decompensation  Dysuria Symptomatic mildly wih h/o recurrent/ resistant UTI, CCUA in office normal, has urology appt in am

## 2020-05-03 NOTE — Assessment & Plan Note (Signed)
Stable , no s/s of decompensation 

## 2020-05-05 LAB — URINE CULTURE

## 2020-05-07 ENCOUNTER — Telehealth: Payer: Self-pay

## 2020-05-07 NOTE — Telephone Encounter (Signed)
-----   Message from Irine Seal, MD sent at 05/06/2020  4:06 PM EDT ----- Mx species.  No treatment.

## 2020-05-07 NOTE — Telephone Encounter (Signed)
Granddaughter Shavalia B. Dalbert Batman made aware.

## 2020-05-16 ENCOUNTER — Other Ambulatory Visit: Payer: Self-pay

## 2020-05-16 ENCOUNTER — Ambulatory Visit (HOSPITAL_COMMUNITY)
Admission: RE | Admit: 2020-05-16 | Discharge: 2020-05-16 | Disposition: A | Discharge status: Home / Self Care | Payer: Medicare HMO | Source: Ambulatory Visit | Attending: Urology | Admitting: Urology

## 2020-05-16 DIAGNOSIS — Z87442 Personal history of urinary calculi: Secondary | ICD-10-CM | POA: Insufficient documentation

## 2020-05-16 DIAGNOSIS — N200 Calculus of kidney: Secondary | ICD-10-CM | POA: Diagnosis not present

## 2020-05-16 DIAGNOSIS — N281 Cyst of kidney, acquired: Secondary | ICD-10-CM | POA: Diagnosis not present

## 2020-05-18 ENCOUNTER — Other Ambulatory Visit: Payer: Self-pay | Admitting: Family Medicine

## 2020-05-21 ENCOUNTER — Telehealth: Payer: Self-pay

## 2020-05-21 NOTE — Telephone Encounter (Signed)
-----   Message from Irine Seal, MD sent at 05/21/2020  9:42 AM EDT ----- She has non-obstructing stones in the kidneys.   We can discuss further at f/u.

## 2020-05-21 NOTE — Telephone Encounter (Signed)
Patient notified of results.

## 2020-05-22 ENCOUNTER — Other Ambulatory Visit: Payer: Self-pay

## 2020-05-22 DIAGNOSIS — M0579 Rheumatoid arthritis with rheumatoid factor of multiple sites without organ or systems involvement: Secondary | ICD-10-CM

## 2020-05-22 MED ORDER — METHOTREXATE 2.5 MG PO TABS: ORAL_TABLET | ORAL | 0 refills | Status: DC

## 2020-05-30 ENCOUNTER — Other Ambulatory Visit: Payer: Self-pay | Admitting: Physician Assistant

## 2020-05-30 ENCOUNTER — Encounter: Payer: Self-pay | Admitting: Family Medicine

## 2020-05-30 ENCOUNTER — Other Ambulatory Visit: Payer: Self-pay

## 2020-05-30 ENCOUNTER — Other Ambulatory Visit: Payer: Self-pay | Admitting: Family Medicine

## 2020-05-30 ENCOUNTER — Ambulatory Visit (INDEPENDENT_AMBULATORY_CARE_PROVIDER_SITE_OTHER): Payer: Medicare HMO | Admitting: Family Medicine

## 2020-05-30 VITALS — BP 148/78 | HR 50 | Resp 15 | Ht 59.0 in | Wt 140.0 lb

## 2020-05-30 DIAGNOSIS — E663 Overweight: Secondary | ICD-10-CM | POA: Diagnosis not present

## 2020-05-30 DIAGNOSIS — E785 Hyperlipidemia, unspecified: Secondary | ICD-10-CM

## 2020-05-30 DIAGNOSIS — I10 Essential (primary) hypertension: Secondary | ICD-10-CM

## 2020-05-30 DIAGNOSIS — E559 Vitamin D deficiency, unspecified: Secondary | ICD-10-CM

## 2020-05-30 DIAGNOSIS — E669 Obesity, unspecified: Secondary | ICD-10-CM

## 2020-05-30 DIAGNOSIS — M0579 Rheumatoid arthritis with rheumatoid factor of multiple sites without organ or systems involvement: Secondary | ICD-10-CM

## 2020-05-30 DIAGNOSIS — M25561 Pain in right knee: Secondary | ICD-10-CM | POA: Diagnosis not present

## 2020-05-30 DIAGNOSIS — R7301 Impaired fasting glucose: Secondary | ICD-10-CM | POA: Diagnosis not present

## 2020-05-30 DIAGNOSIS — R7303 Prediabetes: Secondary | ICD-10-CM | POA: Diagnosis not present

## 2020-05-30 DIAGNOSIS — R7989 Other specified abnormal findings of blood chemistry: Secondary | ICD-10-CM

## 2020-05-30 MED ORDER — HYDROCHLOROTHIAZIDE 12.5 MG PO CAPS: 12.5000 mg | ORAL_CAPSULE | Freq: Every day | ORAL | 2 refills | Status: DC

## 2020-05-30 MED ORDER — PREDNISONE 5 MG PO TABS: 5.0000 mg | ORAL_TABLET | Freq: Two times a day (BID) | ORAL | 0 refills | Status: AC

## 2020-05-30 NOTE — Assessment & Plan Note (Signed)
Hyperlipidemia:Low fat diet discussed and encouraged.   Lipid Panel  Lab Results  Component Value Date   CHOL 146 10/24/2019   HDL 56 10/24/2019   LDLCALC 69 10/24/2019   TRIG 128 10/24/2019   CHOLHDL 2.6 10/24/2019     Updated lab needed at/ before next visit.

## 2020-05-30 NOTE — Telephone Encounter (Signed)
Next Visit: 08/14/2020  Last Visit: 03/12/2020   Last Fill: 03/06/2020  Dx:  Rheumatoid arthritis with rheumatoid factor of multiple sites without organ or systems involvement   Current Dose per office note on 03/12/2020,  prednisone 1 mg 4 tablets daily  Okay to refill prednisone?

## 2020-05-30 NOTE — Patient Instructions (Addendum)
F/U in end August , re evaluate blood pressure and flu vaccine, call if you need me before  New additional medication for blood pressure is HCTZ 12.5 mg one capsule daily, continue amlodipine as before   For right knee pain, new is prednisone 5 mg one twice daily for 5 days only, after this go back to prednisone 4 mg once daily  Labs today, HBA1C, lipid, cmp and eGFr and Vit D  Careful not to fall  Thanks for choosing [facility], we consider it a privelige to serve you.

## 2020-05-30 NOTE — Assessment & Plan Note (Signed)
Uncontrolled , add HCTZ, re eval in 2 months DASH diet and commitment to daily physical activity for a minimum of 30 minutes discussed and encouraged, as a part of hypertension management. The importance of attaining a healthy weight is also discussed.  BP/Weight 05/30/2020 05/02/2020 05/01/2020 03/12/2020 02/29/2020 02/08/2020 74/71/8550  Systolic BP 158 682 574 935 521 747 159  Diastolic BP 78 85 72 78 64 64 70  Wt. (Lbs) 140 137 140 137.2 132.4 135.2 134.12  BMI 28.28 27.67 28.28 27.25 26.74 27.31 27.09

## 2020-05-30 NOTE — Assessment & Plan Note (Signed)
Patient educated about the importance of limiting  Carbohydrate intake , the need to commit to daily physical activity for a minimum of 30 minutes , and to commit weight loss. The fact that changes in all these areas will reduce or eliminate all together the development of diabetes is stressed.   Diabetic Labs Latest Ref Rng & Units 03/12/2020 03/07/2020 02/02/2020 10/24/2019 10/03/2019  HbA1c 4.0 - 5.6 % - - - 6.0(A) -  Microalbumin mg/dL - - - - -  Micro/Creat Ratio 0 - 29 mg/g creat - 167(H) - - -  Chol <200 mg/dL - - - 146 -  HDL > OR = 50 mg/dL - - - 56 -  Calc LDL mg/dL (calc) - - - 69 -  Triglycerides <150 mg/dL - - - 128 -  Creatinine 0.60 - 0.88 mg/dL 0.87 - 0.96 - 0.86   BP/Weight 05/30/2020 05/02/2020 05/01/2020 03/12/2020 02/29/2020 02/08/2020 85/27/7824  Systolic BP 235 361 443 154 008 676 195  Diastolic BP 78 85 72 78 64 64 70  Wt. (Lbs) 140 137 140 137.2 132.4 135.2 134.12  BMI 28.28 27.67 28.28 27.25 26.74 27.31 27.09   Foot/eye exam completion dates Latest Ref Rng & Units 11/22/2019 05/08/2019  Eye Exam No Retinopathy No Retinopathy -  Foot Form Completion - - Done    Updated lab needed at/ before next visit.

## 2020-05-30 NOTE — Assessment & Plan Note (Signed)
Current flare , short course of higher dose of prednisone, then resume maintainace dose of 4 mg daily

## 2020-05-30 NOTE — Progress Notes (Signed)
Stacy Meyers     MRN: [id number]      DOB: 1932-10-13   HPI Stacy Meyers is here  For follow up of uncontrolled blood pressure, and she c/o right leg and posterior knee no inciting trauma   ROS Denies recent fever or chills. Denies sinus pressure, nasal congestion, ear pain or sore throat. Denies chest congestion, productive cough or wheezing. Denies chest pains, palpitations and leg swelling Denies abdominal pain, nausea, vomiting,diarrhea or constipation.   Denies dysuria, frequency, hesitancy or incontinence.  Denies headaches, seizures, numbness, or tingling. Denies depression, anxiety or insomnia. Denies skin break down or rash.   PE  BP (!) 148/78   Pulse (!) 50   Resp 15   Ht 4\' 11"  (1.499 m)   Wt 140 lb (63.5 kg)   SpO2 96%   BMI 28.28 kg/m   Patient alert and oriented and in no cardiopulmonary distress.  HEENT: No facial asymmetry, EOMI,     Neck supple .  Chest: Clear to auscultation bilaterally.  CVS: S1, S2 systolic  murmurs, no S3.Regular rate.  ABD: Soft non tender.   Ext: No edema  MS: decreased  ROM spine, shoulders, hips and knees.Tender over ant right knee  Skin: Intact, no ulcerations or rash noted.  Psych: Good eye contact, normal affect. Memory intact not anxious or depressed appearing.  CNS: CN 2-12 intact, power,  normal throughout.no focal deficits noted.   Assessment & Plan  Essential hypertension, benign Uncontrolled , add HCTZ, re eval in 2 months DASH diet and commitment to daily physical activity for a minimum of 30 minutes discussed and encouraged, as a part of hypertension management. The importance of attaining a healthy weight is also discussed.  BP/Weight 05/30/2020 05/02/2020 05/01/2020 03/12/2020 02/29/2020 02/08/2020 25/36/6440  Systolic BP 347 425 956 387 564 332 951  Diastolic BP 78 85 72 78 64 64 70  Wt. (Lbs) 140 137 140 137.2 132.4 135.2 134.12  BMI 28.28 27.67 28.28 27.25 26.74 27.31 27.09        Prediabetes Patient educated about the importance of limiting  Carbohydrate intake , the need to commit to daily physical activity for a minimum of 30 minutes , and to commit weight loss. The fact that changes in all these areas will reduce or eliminate all together the development of diabetes is stressed.   Diabetic Labs Latest Ref Rng & Units 03/12/2020 03/07/2020 02/02/2020 10/24/2019 10/03/2019  HbA1c 4.0 - 5.6 % - - - 6.0(A) -  Microalbumin mg/dL - - - - -  Micro/Creat Ratio 0 - 29 mg/g creat - 167(H) - - -  Chol <200 mg/dL - - - 146 -  HDL > OR = 50 mg/dL - - - 56 -  Calc LDL mg/dL (calc) - - - 69 -  Triglycerides <150 mg/dL - - - 128 -  Creatinine 0.60 - 0.88 mg/dL 0.87 - 0.96 - 0.86   BP/Weight 05/30/2020 05/02/2020 05/01/2020 03/12/2020 02/29/2020 02/08/2020 88/41/6606  Systolic BP 301 601 093 235 573 220 254  Diastolic BP 78 85 72 78 64 64 70  Wt. (Lbs) 140 137 140 137.2 132.4 135.2 134.12  BMI 28.28 27.67 28.28 27.25 26.74 27.31 27.09   Foot/eye exam completion dates Latest Ref Rng & Units 11/22/2019 05/08/2019  Eye Exam No Retinopathy No Retinopathy -  Foot Form Completion - - Done    Updated lab needed at/ before next visit.   Hyperlipidemia LDL goal <100 Hyperlipidemia:Low fat diet discussed and encouraged.  Lipid Panel  Lab Results  Component Value Date   CHOL 146 10/24/2019   HDL 56 10/24/2019   LDLCALC 69 10/24/2019   TRIG 128 10/24/2019   CHOLHDL 2.6 10/24/2019     Updated lab needed at/ before next visit.   Overweight (BMI 25.0-29.9)  Patient re-educated about  the importance of commitment to a  minimum of 150 minutes of exercise per week as able.  The importance of healthy food choices with portion control discussed, as well as eating regularly and within a 12 hour window most days. The need to choose "clean , green" food 50 to 75% of the time is discussed, as well as to make water the primary drink and set a goal of 64 ounces water daily.     Weight /BMI 05/30/2020 05/02/2020 05/01/2020  WEIGHT 140 lb 137 lb 140 lb  HEIGHT 4\' 11"  4\' 11"  4\' 11"   BMI 28.28 kg/m2 27.67 kg/m2 28.28 kg/m2      Right knee pain Current flare , short course of higher dose of prednisone, then resume maintainace dose of 4 mg daily

## 2020-05-30 NOTE — Assessment & Plan Note (Signed)
  Patient re-educated about  the importance of commitment to a  minimum of 150 minutes of exercise per week as able.  The importance of healthy food choices with portion control discussed, as well as eating regularly and within a 12 hour window most days. The need to choose "clean , green" food 50 to 75% of the time is discussed, as well as to make water the primary drink and set a goal of 64 ounces water daily.    Weight /BMI 05/30/2020 05/02/2020 05/01/2020  WEIGHT 140 lb 137 lb 140 lb  HEIGHT 4\' 11"  4\' 11"  4\' 11"   BMI 28.28 kg/m2 27.67 kg/m2 28.28 kg/m2

## 2020-05-31 ENCOUNTER — Ambulatory Visit (HOSPITAL_COMMUNITY): Payer: Medicare HMO

## 2020-05-31 LAB — LIPID PANEL
Chol/HDL Ratio: 2.4 ratio (ref 0.0–4.4)
Cholesterol, Total: 146 mg/dL (ref 100–199)
HDL: 61 mg/dL (ref >39)
LDL Chol Calc (NIH): 69 mg/dL (ref 0–99)
Triglycerides: 83 mg/dL (ref 0–149)
VLDL Cholesterol Cal: 16 mg/dL (ref 5–40)

## 2020-05-31 LAB — CMP14+EGFR
ALT: 10 IU/L (ref 0–32)
AST: 16 IU/L (ref 0–40)
Albumin/Globulin Ratio: 1.9 (ref 1.2–2.2)
Albumin: 4.4 g/dL (ref 3.6–4.6)
Alkaline Phosphatase: 70 IU/L (ref 44–121)
BUN/Creatinine Ratio: 29 — ABNORMAL HIGH (ref 12–28)
BUN: 25 mg/dL (ref 8–27)
Bilirubin Total: 0.6 mg/dL (ref 0.0–1.2)
CO2: 22 mmol/L (ref 20–29)
Calcium: 10.3 mg/dL (ref 8.7–10.3)
Chloride: 103 mmol/L (ref 96–106)
Creatinine, Ser: 0.85 mg/dL (ref 0.57–1.00)
Globulin, Total: 2.3 g/dL (ref 1.5–4.5)
Glucose: 92 mg/dL (ref 65–99)
Potassium: 4.2 mmol/L (ref 3.5–5.2)
Sodium: 139 mmol/L (ref 134–144)
Total Protein: 6.7 g/dL (ref 6.0–8.5)
eGFR: 67 mL/min/{1.73_m2} (ref >59)

## 2020-05-31 LAB — HEMOGLOBIN A1C
Est. average glucose Bld gHb Est-mCnc: 134 mg/dL
Hgb A1c MFr Bld: 6.3 % — ABNORMAL HIGH (ref 4.8–5.6)

## 2020-05-31 LAB — VITAMIN D 25 HYDROXY (VIT D DEFICIENCY, FRACTURES): Vit D, 25-Hydroxy: 30.2 ng/mL (ref 30.0–100.0)

## 2020-06-07 ENCOUNTER — Other Ambulatory Visit: Payer: Self-pay | Admitting: Family Medicine

## 2020-07-03 ENCOUNTER — Other Ambulatory Visit: Payer: Self-pay | Admitting: Family Medicine

## 2020-07-04 ENCOUNTER — Ambulatory Visit (INDEPENDENT_AMBULATORY_CARE_PROVIDER_SITE_OTHER): Payer: Medicare HMO | Admitting: Urology

## 2020-07-04 ENCOUNTER — Encounter: Payer: Self-pay | Admitting: Urology

## 2020-07-04 ENCOUNTER — Other Ambulatory Visit: Payer: Self-pay

## 2020-07-04 VITALS — BP 142/58 | HR 59

## 2020-07-04 DIAGNOSIS — Z87440 Personal history of urinary (tract) infections: Secondary | ICD-10-CM

## 2020-07-04 DIAGNOSIS — N200 Calculus of kidney: Secondary | ICD-10-CM | POA: Diagnosis not present

## 2020-07-04 DIAGNOSIS — N3946 Mixed incontinence: Secondary | ICD-10-CM | POA: Diagnosis not present

## 2020-07-04 DIAGNOSIS — N281 Cyst of kidney, acquired: Secondary | ICD-10-CM | POA: Diagnosis not present

## 2020-07-04 LAB — URINALYSIS, ROUTINE W REFLEX MICROSCOPIC
Bilirubin, UA: NEGATIVE
Glucose, UA: NEGATIVE
Ketones, UA: NEGATIVE
Nitrite, UA: NEGATIVE
Protein,UA: NEGATIVE
Specific Gravity, UA: <1.005 — ABNORMAL LOW (ref 1.005–1.030)
Urobilinogen, Ur: 0.2 mg/dL (ref 0.2–1.0)
pH, UA: 6 (ref 5.0–7.5)

## 2020-07-04 LAB — MICROSCOPIC EXAMINATION
Epithelial Cells (non renal): >10 /hpf — AB (ref 0–10)
Renal Epithel, UA: NONE SEEN /hpf

## 2020-07-04 NOTE — Progress Notes (Signed)
Subjective: 1. Mixed incontinence   2. Renal stones   3. Bilateral renal cysts   4. Personal history of urinary infection     07/04/20: Stacy Meyers returns today in f/u.   Her culture on 05/02/20 was negative.   She had a renal US that showed bilateral renal stones without obstruction on 05/18/20:  She had some right back pain that was bruised.  That lasted 2-3 days but has resolved.  She has no dysuria or hematuria.  She has nocturia up to 3x.   She continues to wear a pad.   Her UA has 6-10 WBC and few bacteria.    05/02/20: Stacy Meyers is an 87 yo female who is sent by Dr. Moshe Cipro for recurrent e. Coli UTI's with multiple positive cultures since 2/21 with the last on 03/14/20.   She had e. Coli sepsis in 3/21.   She had a 1.4cm right proximal stone with obstruction in 3/21 she was stented and then had ureteroscopy on 05/09/19.  She had a tethered stent and remembers pulling it out but she never followed up with Dr. Claudia Desanctis after the procedure.  She has had no follow up imaging.  She currently has no dysuria but she has nocturia and frequency with urgency with UUI.  She has had no hematuria. She has occasional intermittent right low back pain.  Her urine was clear on 05/01/20.  ROS:  ROS  Allergies  Allergen Reactions   Aspirin Other (See Comments)    REACTION: burns stomach (uncoated)   Sudafed [Pseudoephedrine Hcl] Other (See Comments)    Increased joint pain    Past Medical History:  Diagnosis Date   Acid reflux    Acute cystitis with hematuria 04/11/2011   Anemia    ARF (acute renal failure) ([place]) 12/14/2010   Arthritis    CKD (chronic kidney disease), stage II    previously seen by nephrologist--- dr Florene Glen,  pt released per lov in epic 09-24-2017   Diabetes mellitus, type II ([place])    Diverticulosis of colon    Essential hypertension, benign    followed by pcp   (pt had nuclear stress test (in epic under media) dated 05-25-2008 showed normal perfusion w/ no evidence ischemia, ef 83%   Heart  disease    History of bradycardia    previously seen by cardiologist, dr branch, pt released per lov 08-11-2018 in epic, bradycardia resolved   History of sepsis    04-03-2019 hospital admission w/ urosepsis due to obstructive uropathy due to right ureter stone   Hypokalemia    Lipoma of colon    ascending   Lumbar spondylosis    Mixed hyperlipidemia    Nephrolithiasis    per CT 04-03-2019 nonobstructive bilateral stones   Ocular herpes    Osteoarthritis    followed by dr Estanislado Pandy--- hands, shoulders, feet   Osteoporosis    Rheumatoid arthritis(714.0)    rheumotologist-- dr s. Estanislado Pandy---  multiple sites, treated with methotrexate/ prednisone   Right nephrolithiasis-1.4cm Moderate right-sided urinary tract obstruction/hydronephrosis 04/03/2019   Right ureteral calculus    Seasonal allergies    Sepsis ([place]) 04/03/2019   Sepsis secondary to UTI /Nephrolithiasis with obstructive uropathy 04/03/2019   SUI (stress urinary incontinence, female)    Uterine fibroid     Past Surgical History:  Procedure Laterality Date   BUNIONECTOMY Right 2012   CATARACT EXTRACTION W/ INTRAOCULAR LENS  IMPLANT, BILATERAL  2009   CHOLECYSTECTOMY  12/17/2010   Procedure: LAPAROSCOPIC CHOLECYSTECTOMY;  Surgeon: Donato Heinz;  Location: [facility];  Service: General;  Laterality: N/A;   COLONOSCOPY  05/22/2011   Procedure: COLONOSCOPY;  Surgeon: Rogene Houston, MD;  Location: [facility];  Service: Endoscopy;  Laterality: N/A;  [place], URETEROSCOPY AND STENT PLACEMENT Right 05/09/2019   Procedure: CYSTOSCOPY WITH RETROGRADE PYELOGRAM, URETEROSCOPY AND STENT PLACEMENT;  Surgeon: Robley Fries, MD;  Location[facility];  Service: Urology;  Laterality: Right;  1 HR   CYSTOSCOPY WITH STENT PLACEMENT Right 04/03/2019   Procedure: CYSTOSCOPY, RIGHT RETROGRADE PYELOGRAM  WITH STENT PLACEMENT;  Surgeon: Robley Fries, MD;  Location: [facility];  Service:  Urology;  Laterality: Right;   EXCISION BONE CYST Right 2010   RIGHT SHOULDER   GANGLION CYST EXCISION Left 06/02/2012   Procedure: Excision ganglion left dorsal wrist;  Surgeon: Sanjuana Kava, MD;  Location: [facility];  Service: Orthopedics;  Laterality: Left;   HOLMIUM LASER APPLICATION Right 03/10/4654   Procedure: HOLMIUM LASER APPLICATION;  Surgeon: Robley Fries, MD;  Location: [facility];  Service: Urology;  Laterality: Right;   kidney stones     LUMBAR MICRODISCECTOMY  07-16-2000   @[facility]    L 2  -3   POSTERIOR LAMINECTOMY / DECOMPRESSION LUMBAR SPINE  06-26-2009   @[facility]    w/ LATERAL MASS FUSION , L3 -- 5   SHOULDER ARTHROSCOPY WITH DISTAL CLAVICLE RESECTION Right 01-10-2009   @[facility]    w/ DEBRIDEMENT AND EXCISION RECURRENT GANGLION CYST    Social History   Socioeconomic History   Marital status: Widowed    Spouse name: Not on file   Number of children: 1   Years of education: Not on file   Highest education level: Not on file  Occupational History   Occupation: Retired    Comment: [employer]  Tobacco Use   Smoking status: Never   Smokeless tobacco: Never  Vaping Use   Vaping Use: Never used  Substance and Sexual Activity   Alcohol use: No   Drug use: Never   Sexual activity: Not Currently    Birth control/protection: Post-menopausal  Other Topics Concern   Not on file  Social History Narrative   Husband passed away 04/21/16. It has been financially harder to pay the bills since his passing, but she is making it. Has grandchildren around which makes it easier.    Social Determinants of Health   Financial Resource Strain: Low Risk    Difficulty of Paying Living Expenses: Not hard at all  Food Insecurity: No Food Insecurity   Worried About Charity fundraiser in the Last Year: Never true   [place] in the Last Year: Never true  Transportation Needs: No Transportation Needs   Lack of Transportation (Medical): No   Lack of Transportation  (Non-Medical): No  Physical Activity: Insufficiently Active   Days of Exercise per Week: 3 days   Minutes of Exercise per Session: 30 min  Stress: No Stress Concern Present   Feeling of Stress : Not at all  Social Connections: Moderately Integrated   Frequency of Communication with Friends and Family: More than three times a week   Frequency of Social Gatherings with Friends and Family: More than three times a week   Attends Religious Services: More than 4 times per year   Active Member of [employer] or Organizations: Yes   Attends Archivist Meetings: 1 to 4 times per year   Marital Status: Widowed  Intimate Partner Violence:  Not on file    Family History  Problem Relation Age of Onset   Stroke Mother    Cancer Father    Diabetes Sister    Diabetes Sister    Kidney failure Brother    Kidney failure Brother    Pancreatic cancer Sister    Kidney Stones Sister        Only one Kidney   Arthritis Sister    Heart disease Daughter     Anti-infectives: Anti-infectives (From admission, onward)    None       Current Outpatient Medications  Medication Sig Dispense Refill   acetaminophen (TYLENOL) 500 MG tablet Take 1,000 mg by mouth every 6 (six) hours as needed (for pain).      acyclovir (ZOVIRAX) 400 MG tablet TAKE 1 TABLET BY MOUTH TWICE A DAY 180 tablet 1   alendronate (FOSAMAX) 70 MG tablet TAKE 1 TABLET BY MOUTH EVERY 7 DAYS. TAKE WITH A FULL GLASS OF WATER ON AN EMPTY STOMACH. 12 tablet 12   amLODipine (NORVASC) 5 MG tablet TAKE 1 TABLET BY MOUTH EVERY DAY 90 tablet 0   folic acid (FOLVITE) 1 MG tablet Take 1 tablet (1 mg total) by mouth daily. 90 tablet 3   hydrochlorothiazide (MICROZIDE) 12.5 MG capsule TAKE 1 CAPSULE BY MOUTH EVERY DAY 90 capsule 1   methotrexate (RHEUMATREX) 2.5 MG tablet TAKE 3 TABLETS EACH WEEK ON THE SAME DAY. 36 tablet 0   montelukast (SINGULAIR) 10 MG tablet TAKE 1 TABLET BY MOUTH EVERYDAY AT BEDTIME 90 tablet 1   potassium chloride SA  (KLOR-CON M20) 20 MEQ tablet Take 1 tablet (20 mEq total) by mouth daily. 90 tablet 1   predniSONE (DELTASONE) 1 MG tablet TAKE 4 TABLETS (4 MG TOTAL) BY MOUTH DAILY WITH BREAKFAST 360 tablet 0   Propylene Glycol (SYSTANE BALANCE OP) Place 1 drop into both eyes daily as needed (for dry eyes).      rosuvastatin (CRESTOR) 5 MG tablet TAKE 1 TABLET BY MOUTH EVERY OTHER DAY 45 tablet 0   No current facility-administered medications for this visit.     Objective: Vital signs in last 24 hours: BP (!) 142/58   Pulse (!) 59   Intake/Output from previous day: No intake/output data recorded. Intake/Output this shift: @IOTHISSHIFT @   Physical Exam  Lab Results:  Results for orders placed or performed in visit on 07/04/20 (from the past 24 hour(s))  Urinalysis, Routine w reflex microscopic     Status: Abnormal   Collection Time: 07/04/20 10:28 AM  Result Value Ref Range   Specific Gravity, UA <1.005 (L) 1.005 - 1.030   pH, UA 6.0 5.0 - 7.5   Color, UA Yellow Yellow   Appearance Ur Clear Clear   Leukocytes,UA Trace (A) Negative   Protein,UA Negative Negative/Trace   Glucose, UA Negative Negative   Ketones, UA Negative Negative   RBC, UA Trace (A) Negative   Bilirubin, UA Negative Negative   Urobilinogen, Ur 0.2 0.2 - 1.0 mg/dL   Nitrite, UA Negative Negative   Microscopic Examination See below:    Narrative   Performed at:  [facility] [address], [place], [place]  [id number] Lab Director: Mina Marble MT, Phone:  [phone]  Microscopic Examination     Status: Abnormal   Collection Time: 07/04/20 10:28 AM   Urine  Result Value Ref Range   WBC, UA 6-10 (A) 0 - 5 /hpf   RBC 0-2 0 - 2 /hpf   Epithelial Cells (  non renal) >10 (A) 0 - 10 /hpf   Renal Epithel, UA None seen None seen /hpf   Bacteria, UA Few (A) None seen/Few   Narrative   Performed at:  [facility] [address] [place], [place]  [id number] Lab Director: [place],  Phone:  [phone]     BMET No results for input(s): NA, K, CL, CO2, GLUCOSE, BUN, CREATININE, CALCIUM in the last 72 hours. PT/INR No results for input(s): LABPROT, INR in the last 72 hours. ABG No results for input(s): PHART, HCO3 in the last 72 hours.  Invalid input(s): PCO2, PO2  Studies/Results: CLINICAL DATA:  History of renal calculi and recurrent UTI   EXAM: RENAL / URINARY TRACT ULTRASOUND COMPLETE   COMPARISON:  CT abdomen pelvis 04/03/2019   FINDINGS: Right Kidney:   Renal measurements: 8.3 x 3.6 x 3.4 cm = volume: 54 mL. Diffuse cortical thinning. Increased echogenicity. Multiple shadowing renal calculi measuring up to 0.9 cm. There is a cyst in the superior pole measuring 1.8 cm. No hydronephrosis.   Left Kidney:   Renal measurements: 11.1 x 4.7 x 5.8 cm = volume: 157 mL. Diffuse cortical thinning and increased echogenicity. There is a shadowing calculus measuring 0.7 cm. There is a cyst in the superior pole measuring 2.9 cm no hydronephrosis.   Bladder: Appears normal for degree of bladder distention.   Other:   None.   IMPRESSION: 1. Bilateral right greater than left nonobstructing renal calculi. No hydronephrosis.   2.  Bilateral renal cysts.   3. Increased echogenicity of the bilateral kidneys with cortical thinning, as can be seen in medical renal disease.     Electronically Signed   By: Audie Pinto M.D.   On: 05/18/2020 11:45   Assessment/Plan: Recurrent UTI with prior sepsis.   Her UA today is unremarkable and her last culture was negative.   Renal stones and cysts with bilateral renal atrophy on [place].   I will get a KUB and renal US to assess for growth of the stones in 6 mo.    Mixed incontinence with nocturia.  Stable and not in need of therapy.    No orders of the defined types were placed in this encounter.    Orders Placed This Encounter  Procedures   Microscopic Examination   US RENAL    Standing Status:   Future     Standing Expiration Date:   07/04/2021    Order Specific Question:   Reason for Exam (SYMPTOM  OR DIAGNOSIS REQUIRED)    Answer:   renal stones    Order Specific Question:   Preferred imaging location?    Answer:   [facility]   DG Abd 1 View    Standing Status:   Future    Standing Expiration Date:   07/04/2021    Order Specific Question:   Reason for Exam (SYMPTOM  OR DIAGNOSIS REQUIRED)    Answer:   renal stones    Order Specific Question:   Preferred imaging location?    Answer:   [facility]    Order Specific Question:   Radiology Contrast Protocol - do NOT remove file path    Answer:   [email or web address]   Urinalysis, Routine w reflex microscopic     Return in about 6 months (around 01/03/2021) for with KUB and renal US. .    CC: Dr. [name].     Irine Seal 07/04/2020 [phone]

## 2020-07-04 NOTE — Progress Notes (Signed)

## 2020-07-05 ENCOUNTER — Ambulatory Visit (HOSPITAL_COMMUNITY)
Admission: RE | Admit: 2020-07-05 | Discharge: 2020-07-05 | Disposition: A | Discharge status: Home / Self Care | Payer: Medicare HMO | Source: Ambulatory Visit | Attending: Family Medicine | Admitting: Family Medicine

## 2020-07-05 DIAGNOSIS — Z1231 Encounter for screening mammogram for malignant neoplasm of breast: Secondary | ICD-10-CM | POA: Diagnosis not present

## 2020-07-17 ENCOUNTER — Ambulatory Visit (INDEPENDENT_AMBULATORY_CARE_PROVIDER_SITE_OTHER): Payer: Medicare HMO | Admitting: *Deleted

## 2020-07-17 ENCOUNTER — Other Ambulatory Visit: Payer: Self-pay

## 2020-07-17 DIAGNOSIS — Z0000 Encounter for general adult medical examination without abnormal findings: Secondary | ICD-10-CM | POA: Diagnosis not present

## 2020-07-17 NOTE — Patient Instructions (Signed)
Stacy Meyers , Thank you for taking time to come for your Medicare Wellness Visit. I appreciate your ongoing commitment to your health goals. Please review the following plan we discussed and let me know if I can assist you in the future.   Screening recommendations/referrals:  Mammogram: Completed Due 07-05-21 Bone Density: Completed Due 07-30-24 Recommended yearly ophthalmology/optometry visit for glaucoma screening and checkup Recommended yearly dental visit for hygiene and checkup  Vaccinations: Influenza vaccine: 08-05-20 Pneumococcal vaccine: Completed Tdap vaccine: Due now Shingles vaccine: Due now patient was advised in the past not to take this vaccine per patient     Advanced directives: Advised to make sure office has a copy  Conditions/risks identified: Hypertension, Prediabetes  Next appointment: 1 year    Preventive Care 38 Years and Older, Female Preventive care refers to lifestyle choices and visits with your health care provider that can promote health and wellness. What does preventive care include? A yearly physical exam. This is also called an annual well check. Dental exams once or twice a year. Routine eye exams. Ask your health care provider how often you should have your eyes checked. Personal lifestyle choices, including: Daily care of your teeth and gums. Regular physical activity. Eating a healthy diet. Avoiding tobacco and drug use. Limiting alcohol use. Practicing safe sex. Taking low-dose aspirin every day. Taking vitamin and mineral supplements as recommended by your health care provider. What happens during an annual well check? The services and screenings done by your health care provider during your annual well check will depend on your age, overall health, lifestyle risk factors, and family history of disease. Counseling  Your health care provider may ask you questions about your: Alcohol use. Tobacco use. Drug use. Emotional well-being. Home  and relationship well-being. Sexual activity. Eating habits. History of falls. Memory and ability to understand (cognition). Work and work Statistician. Reproductive health. Screening  You may have the following tests or measurements: Height, weight, and BMI. Blood pressure. Lipid and cholesterol levels. These may be checked every 5 years, or more frequently if you are over 103 years old. Skin check. Lung cancer screening. You may have this screening every year starting at age 7 if you have a 30-pack-year history of smoking and currently smoke or have quit within the past 15 years. Fecal occult blood test (FOBT) of the stool. You may have this test every year starting at age 42. Flexible sigmoidoscopy or colonoscopy. You may have a sigmoidoscopy every 5 years or a colonoscopy every 10 years starting at age 2. Hepatitis C blood test. Hepatitis B blood test. Sexually transmitted disease (STD) testing. Diabetes screening. This is done by checking your blood sugar (glucose) after you have not eaten for a while (fasting). You may have this done every 1-3 years. Bone density scan. This is done to screen for osteoporosis. You may have this done starting at age 25. Mammogram. This may be done every 1-2 years. Talk to your health care provider about how often you should have regular mammograms. Talk with your health care provider about your test results, treatment options, and if necessary, the need for more tests. Vaccines  Your health care provider may recommend certain vaccines, such as: Influenza vaccine. This is recommended every year. Tetanus, diphtheria, and acellular pertussis (Tdap, Td) vaccine. You may need a Td booster every 10 years. Zoster vaccine. You may need this after age 79. Pneumococcal 13-valent conjugate (PCV13) vaccine. One dose is recommended after age 57. Pneumococcal polysaccharide (PPSV23) vaccine. One  dose is recommended after age 61. Talk to your health care provider  about which screenings and vaccines you need and how often you need them. This information is not intended to replace advice given to you by your health care provider. Make sure you discuss any questions you have with your health care provider. Document Released: 01/18/2015 Document Revised: 09/11/2015 Document Reviewed: 10/23/2014 Elsevier Interactive Patient Education  2017 [employer] Prevention in the Home Falls can cause injuries. They can happen to people of all ages. There are many things you can do to make your home safe and to help prevent falls. What can I do on the outside of my home? Regularly fix the edges of walkways and driveways and fix any cracks. Remove anything that might make you trip as you walk through a door, such as a raised step or threshold. Trim any bushes or trees on the path to your home. Use bright outdoor lighting. Clear any walking paths of anything that might make someone trip, such as rocks or tools. Regularly check to see if handrails are loose or broken. Make sure that both sides of any steps have handrails. Any raised decks and porches should have guardrails on the edges. Have any leaves, snow, or ice cleared regularly. Use sand or salt on walking paths during winter. Clean up any spills in your garage right away. This includes oil or grease spills. What can I do in the bathroom? Use night lights. Install grab bars by the toilet and in the tub and shower. Do not use towel bars as grab bars. Use non-skid mats or decals in the tub or shower. If you need to sit down in the shower, use a plastic, non-slip stool. Keep the floor dry. Clean up any water that spills on the floor as soon as it happens. Remove soap buildup in the tub or shower regularly. Attach bath mats securely with double-sided non-slip rug tape. Do not have throw rugs and other things on the floor that can make you trip. What can I do in the bedroom? Use night lights. Make sure  that you have a light by your bed that is easy to reach. Do not use any sheets or blankets that are too big for your bed. They should not hang down onto the floor. Have a firm chair that has side arms. You can use this for support while you get dressed. Do not have throw rugs and other things on the floor that can make you trip. What can I do in the kitchen? Clean up any spills right away. Avoid walking on wet floors. Keep items that you use a lot in easy-to-reach places. If you need to reach something above you, use a strong step stool that has a grab bar. Keep electrical cords out of the way. Do not use floor polish or wax that makes floors slippery. If you must use wax, use non-skid floor wax. Do not have throw rugs and other things on the floor that can make you trip. What can I do with my stairs? Do not leave any items on the stairs. Make sure that there are handrails on both sides of the stairs and use them. Fix handrails that are broken or loose. Make sure that handrails are as long as the stairways. Check any carpeting to make sure that it is firmly attached to the stairs. Fix any carpet that is loose or worn. Avoid having throw rugs at the top or bottom of the  stairs. If you do have throw rugs, attach them to the floor with carpet tape. Make sure that you have a light switch at the top of the stairs and the bottom of the stairs. If you do not have them, ask someone to add them for you. What else can I do to help prevent falls? Wear shoes that: Do not have high heels. Have rubber bottoms. Are comfortable and fit you well. Are closed at the toe. Do not wear sandals. If you use a stepladder: Make sure that it is fully opened. Do not climb a closed stepladder. Make sure that both sides of the stepladder are locked into place. Ask someone to hold it for you, if possible. Clearly mark and make sure that you can see: Any grab bars or handrails. First and last steps. Where the edge of  each step is. Use tools that help you move around (mobility aids) if they are needed. These include: Canes. Walkers. Scooters. Crutches. Turn on the lights when you go into a dark area. Replace any light bulbs as soon as they burn out. Set up your furniture so you have a clear path. Avoid moving your furniture around. If any of your floors are uneven, fix them. If there are any pets around you, be aware of where they are. Review your medicines with your doctor. Some medicines can make you feel dizzy. This can increase your chance of falling. Ask your doctor what other things that you can do to help prevent falls. This information is not intended to replace advice given to you by your health care provider. Make sure you discuss any questions you have with your health care provider. Document Released: 10/18/2008 Document Revised: 05/30/2015 Document Reviewed: 01/26/2014 Elsevier Interactive Patient Education  2017 [employer].

## 2020-07-17 NOTE — Progress Notes (Signed)
Subjective:   Stacy Meyers is a 87 y.o. female who presents for Medicare Annual (Subsequent) preventive examination.  Review of Systems           Objective:    There were no vitals filed for this visit. There is no height or weight on file to calculate BMI.  Advanced Directives 07/17/2019 05/09/2019 04/03/2019 04/03/2019 05/24/2017 05/06/2016 06/02/2012  Does Patient Have a Medical Advance Directive? Yes No Yes Yes Yes Yes Patient does not have advance directive;Patient would not like information  Type of Advance Directive Healthcare Power of [place];Living will [place];Living will Living will [place];Living will -  Does patient want to make changes to medical advance directive? - - No - Patient declined - No - Patient declined No - Patient declined -  Copy of [place] in Chart? No - copy requested - No - copy requested - - No - copy requested -  Would patient like information on creating a medical advance directive? - No - Patient declined - - - - -  Pre-existing out of facility DNR order (yellow form or pink MOST form) - - - - - - No    Current Medications (verified) Outpatient Encounter Medications as of 07/17/2020  Medication Sig   acetaminophen (TYLENOL) 500 MG tablet Take 1,000 mg by mouth every 6 (six) hours as needed (for pain).    acyclovir (ZOVIRAX) 400 MG tablet TAKE 1 TABLET BY MOUTH TWICE A DAY   alendronate (FOSAMAX) 70 MG tablet TAKE 1 TABLET BY MOUTH EVERY 7 DAYS. TAKE WITH A FULL GLASS OF WATER ON AN EMPTY STOMACH.   amLODipine (NORVASC) 5 MG tablet TAKE 1 TABLET BY MOUTH EVERY DAY   folic acid (FOLVITE) 1 MG tablet Take 1 tablet (1 mg total) by mouth daily.   hydrochlorothiazide (MICROZIDE) 12.5 MG capsule TAKE 1 CAPSULE BY MOUTH EVERY DAY   methotrexate (RHEUMATREX) 2.5 MG tablet TAKE 3 TABLETS EACH WEEK ON THE SAME DAY.   montelukast (SINGULAIR) 10 MG tablet TAKE 1 TABLET BY  MOUTH EVERYDAY AT BEDTIME   potassium chloride SA (KLOR-CON M20) 20 MEQ tablet Take 1 tablet (20 mEq total) by mouth daily.   predniSONE (DELTASONE) 1 MG tablet TAKE 4 TABLETS (4 MG TOTAL) BY MOUTH DAILY WITH BREAKFAST   Propylene Glycol (SYSTANE BALANCE OP) Place 1 drop into both eyes daily as needed (for dry eyes).    rosuvastatin (CRESTOR) 5 MG tablet TAKE 1 TABLET BY MOUTH EVERY OTHER DAY   No facility-administered encounter medications on file as of 07/17/2020.    Allergies (verified) Aspirin and Sudafed [pseudoephedrine hcl]   History: Past Medical History:  Diagnosis Date   Acid reflux    Acute cystitis with hematuria 04/11/2011   Anemia    ARF (acute renal failure) ([place]) 12/14/2010   Arthritis    CKD (chronic kidney disease), stage II    previously seen by nephrologist--- dr Florene Glen,  pt released per lov in epic 09-24-2017   Diabetes mellitus, type II ([place])    Diverticulosis of colon    Essential hypertension, benign    followed by pcp   (pt had nuclear stress test (in epic under media) dated 05-25-2008 showed normal perfusion w/ no evidence ischemia, ef 83%   Heart disease    History of bradycardia    previously seen by cardiologist, dr branch, pt released per lov 08-11-2018 in epic, bradycardia resolved   History of  sepsis    04-03-2019 hospital admission w/ urosepsis due to obstructive uropathy due to right ureter stone   Hypokalemia    Lipoma of colon    ascending   Lumbar spondylosis    Mixed hyperlipidemia    Nephrolithiasis    per CT 04-03-2019 nonobstructive bilateral stones   Ocular herpes    Osteoarthritis    followed by dr Estanislado Pandy--- hands, shoulders, feet   Osteoporosis    Rheumatoid arthritis(714.0)    rheumotologist-- dr s. Estanislado Pandy---  multiple sites, treated with methotrexate/ prednisone   Right nephrolithiasis-1.4cm Moderate right-sided urinary tract obstruction/hydronephrosis 04/03/2019   Right ureteral calculus    Seasonal allergies    Sepsis  ([place]) 04/03/2019   Sepsis secondary to UTI /Nephrolithiasis with obstructive uropathy 04/03/2019   SUI (stress urinary incontinence, female)    Uterine fibroid    Past Surgical History:  Procedure Laterality Date   BUNIONECTOMY Right 2012   CATARACT EXTRACTION W/ INTRAOCULAR LENS  IMPLANT, BILATERAL  2009   CHOLECYSTECTOMY  12/17/2010   Procedure: LAPAROSCOPIC CHOLECYSTECTOMY;  Surgeon: Donato Heinz;  Location: [facility];  Service: General;  Laterality: N/A;   COLONOSCOPY  05/22/2011   Procedure: COLONOSCOPY;  Surgeon: Rogene Houston, MD;  Location: [facility];  Service: Endoscopy;  Laterality: N/A;  [place], URETEROSCOPY AND STENT PLACEMENT Right 05/09/2019   Procedure: CYSTOSCOPY WITH RETROGRADE PYELOGRAM, URETEROSCOPY AND STENT PLACEMENT;  Surgeon: Robley Fries, MD;  Location: [facility];  Service: Urology;  Laterality: Right;  1 HR   CYSTOSCOPY WITH STENT PLACEMENT Right 04/03/2019   Procedure: CYSTOSCOPY, RIGHT RETROGRADE PYELOGRAM  WITH STENT PLACEMENT;  Surgeon: Robley Fries, MD;  Location: [facility];  Service: Urology;  Laterality: Right;   EXCISION BONE CYST Right 2010   RIGHT SHOULDER   GANGLION CYST EXCISION Left 06/02/2012   Procedure: Excision ganglion left dorsal wrist;  Surgeon: Sanjuana Kava, MD;  Location: [facility];  Service: Orthopedics;  Laterality: Left;   HOLMIUM LASER APPLICATION Right 5/0/2774   Procedure: HOLMIUM LASER APPLICATION;  Surgeon: Robley Fries, MD;  Location: [facility];  Service: Urology;  Laterality: Right;   kidney stones     LUMBAR MICRODISCECTOMY  07-16-2000   @[facility]    L 2  -3   POSTERIOR LAMINECTOMY / DECOMPRESSION LUMBAR SPINE  06-26-2009   @[facility]    w/ LATERAL MASS FUSION , L3 -- 5   SHOULDER ARTHROSCOPY WITH DISTAL CLAVICLE RESECTION Right 01-10-2009   @[facility]    w/ DEBRIDEMENT AND EXCISION RECURRENT GANGLION CYST   Family History  Problem Relation Age of Onset   Stroke Mother     Cancer Father    Diabetes Sister    Diabetes Sister    Kidney failure Brother    Kidney failure Brother    Pancreatic cancer Sister    Kidney Stones Sister        Only one Kidney   Arthritis Sister    Heart disease Daughter    Social History   Socioeconomic History   Marital status: Widowed    Spouse name: Not on file   Number of children: 1   Years of education: Not on file   Highest education level: Not on file  Occupational History   Occupation: Retired    Comment: [employer]  Tobacco Use   Smoking status: Never   Smokeless tobacco: Never  Vaping Use   Vaping Use: Never used  Substance and Sexual Activity   Alcohol  use: No   Drug use: Never   Sexual activity: Not Currently    Birth control/protection: Post-menopausal  Other Topics Concern   Not on file  Social History Narrative   Husband passed away Apr 28, 2016. It has been financially harder to pay the bills since his passing, but she is making it. Has grandchildren around which makes it easier.    Social Determinants of Health   Financial Resource Strain: Not on file  Food Insecurity: Not on file  Transportation Needs: Not on file  Physical Activity: Not on file  Stress: Not on file  Social Connections: Not on file    Tobacco Counseling Counseling given: Not Answered   Clinical Intake:                 Diabetic?Yes         Activities of Daily Living No flowsheet data found.  Patient Care Team: Fayrene Helper, MD as PCP - General Branch, Alphonse Guild, MD as PCP - Cardiology (Cardiology) Estanislado Emms, MD (Inactive) as Consulting Physician (Nephrology) Bo Merino, MD as Consulting Physician (Rheumatology)  Indicate any recent Medical Services you may have received from other than [facility] providers in the past year (date may be approximate).     Assessment:   This is a routine wellness examination for Kamia.  Hearing/Vision screen No results found.  Dietary issues  and exercise activities discussed:     Goals Addressed   None   Depression Screen PHQ 2/9 Scores 05/01/2020 10/19/2019 07/17/2019 07/17/2019 05/08/2019 04/12/2019 05/27/2018  PHQ - 2 Score 0 0 0 0 0 0 0  PHQ- 9 Score - - - - 0 - -    Fall Risk Fall Risk  05/30/2020 05/01/2020 02/29/2020 02/28/2020 02/28/2020  Falls in the past year? 0 0 - - 1  Comment - - - - -  Number falls in past yr: 0 0 0 0 0  Comment - - - fell in bathtub -  Injury with Fall? 0 0 0 0 0  Risk for fall due to : - No Fall Risks - - -  Follow up - Falls evaluation completed - - -  Comment - - - - -    FALL RISK PREVENTION PERTAINING TO THE HOME:  Any stairs in or around the home? Yes  If so, are there any without handrails? Yes  Home free of loose throw rugs in walkways, pet beds, electrical cords, etc? No  Adequate lighting in your home to reduce risk of falls? No   ASSISTIVE DEVICES UTILIZED TO PREVENT FALLS:  Life alert? No  Use of a cane, walker or w/c? Yes  Grab bars in the bathroom? Yes  Shower chair or bench in shower? Yes  Elevated toilet seat or a handicapped toilet? No   TIMED UP AND GO:  Was the test performed? No .  Length of time to ambulate 10 feet: NA sec.     Cognitive Function: MMSE - Mini Mental State Exam 05/01/2020  Orientation to time 5  Orientation to Place 5  Registration 3  Attention/ Calculation 5  Recall 2  Language- name 2 objects 2  Language- repeat 1  Language- follow 3 step command 3  Language- read & follow direction 1  Write a sentence 1  Copy design 1  Total score 29     6CIT Screen 07/17/2019 05/27/2018 05/24/2017 05/06/2016  What Year? 0 points 0 points 0 points 0 points  What month? 0 points 0  points 0 points 0 points  What time? 0 points 0 points 0 points 0 points  Count back from 20 0 points 0 points 0 points 0 points  Months in reverse 2 points 2 points 0 points 0 points  Repeat phrase 0 points 0 points 0 points 0 points  Total Score 2 2 0 0     Immunizations Immunization History  Administered Date(s) Administered   Fluad Quad(high Dose 65+) 10/19/2019   Influenza Split 11/07/2013   Influenza Whole 10/28/2009, 09/17/2010   Influenza, High Dose Seasonal PF 09/29/2016, 09/15/2018   Influenza,inj,Quad PF,6+ Mos 09/03/2014   Influenza-Unspecified 10/05/2013, 09/29/2016   Moderna Sars-Covid-2 Vaccination 01/17/2019, 02/27/2019, 10/30/2019   PPD Test 05/14/2014, 05/28/2015   Pneumococcal Conjugate-13 08/03/2013   Pneumococcal Polysaccharide-23 08/28/2009   Tdap 04/29/2010    TDAP status: Due, Education has been provided regarding the importance of this vaccine. Advised may receive this vaccine at local pharmacy or Health Dept. Aware to provide a copy of the vaccination record if obtained from local pharmacy or Health Dept. Verbalized acceptance and understanding.  Flu Vaccine status: Up to date  Pneumococcal vaccine status: Up to date  Covid-19 vaccine status: Completed vaccines  Qualifies for Shingles Vaccine? Yes   Zostavax completed No   Shingrix Completed?: No.    Education has been provided regarding the importance of this vaccine. Patient has been advised to call insurance company to determine out of pocket expense if they have not yet received this vaccine. Advised may also receive vaccine at local pharmacy or Health Dept. Verbalized acceptance and understanding.  Screening Tests Health Maintenance  Topic Date Due   Zoster Vaccines- Shingrix (1 of 2) Never done   TETANUS/TDAP  05/01/2021 (Originally 04/28/2020)   INFLUENZA VACCINE  08/05/2020   COVID-19 Vaccine (5 - Booster for Moderna series) 08/28/2020   URINE MICROALBUMIN  03/07/2021   DEXA SCAN  Completed   PNA vac Low Risk Adult  Completed   HPV VACCINES  Aged Out    Health Maintenance  Health Maintenance Due  Topic Date Due   Zoster Vaccines- Shingrix (1 of 2) Never done    Colorectal cancer screening: No longer required.   Mammogram status:  Completed 07-05-20. Repeat every year  Bone Density status: Completed 07-31-19. Results reflect: Bone density results: OSTEOPOROSIS. Repeat every 5 years.  Lung Cancer Screening: (Low Dose CT Chest recommended if Age 16-80 years, 30 pack-year currently smoking OR have quit w/in 15years.) does not qualify.   Lung Cancer Screening Referral: NA  Additional Screening:  Hepatitis C Screening: does not qualify; Completed  Vision Screening: Recommended annual ophthalmology exams for early detection of glaucoma and other disorders of the eye. Is the patient up to date with their annual eye exam?  Yes  Who is the provider or what is the name of the office in which the patient attends annual eye exams? [facility] Hoff If pt is not established with a provider, would they like to be referred to a provider to establish care? No .   Dental Screening: Recommended annual dental exams for proper oral hygiene  Community Resource Referral / Chronic Care Management: CRR required this visit?  No   CCM required this visit?  No      Plan:     I have personally reviewed and noted the following in the patient's chart:   Medical and social history Use of alcohol, tobacco or illicit drugs  Current medications and supplements including opioid prescriptions.  Functional ability  and status Nutritional status Physical activity Advanced directives List of other physicians Hospitalizations, surgeries, and ER visits in previous 12 months Vitals Screenings to include cognitive, depression, and falls Referrals and appointments  In addition, I have reviewed and discussed with patient certain preventive protocols, quality metrics, and best practice recommendations. A written personalized care plan for preventive services as well as general preventive health recommendations were provided to patient.     Shelda Altes, CMA   07/17/2020   Nurse Notes:

## 2020-07-19 ENCOUNTER — Other Ambulatory Visit: Payer: Self-pay | Admitting: Physician Assistant

## 2020-07-31 NOTE — Progress Notes (Signed)
Office Visit Note  Patient: Stacy Meyers             Date of Birth: 10/06/1932           MRN: [id number]             PCP: Fayrene Helper, MD Referring: Fayrene Helper, MD Visit Date: 08/14/2020 Occupation: '@GUAROCC'$ @  Subjective:  Left shoulder pain   History of Present Illness: Stacy Meyers is a 87 y.o. female with history of rheumatoid arthritis and osteoarthritis.  She states she has been having headaches and neck pain.  She states her left shoulder has been hurting as well.  She denies any joint swelling.  She notices some fluid retention at the end of the day.  She states she had some pain in her right knee last night and she used a patch which was helpful.  Activities of Daily Living:  Patient reports morning stiffness for 1 hour.   Patient Reports nocturnal pain.  Difficulty dressing/grooming: Denies Difficulty climbing stairs: Denies Difficulty getting out of chair: Reports Difficulty using hands for taps, buttons, cutlery, and/or writing: Denies  Review of Systems  Constitutional:  Positive for fatigue.  HENT:  Positive for mouth dryness and nose dryness. Negative for mouth sores.   Eyes:  Positive for itching. Negative for pain and dryness.  Respiratory:  Negative for shortness of breath and difficulty breathing.   Cardiovascular:  Negative for chest pain and palpitations.  Gastrointestinal:  Negative for blood in stool, constipation and diarrhea.  Endocrine: Negative for increased urination.  Genitourinary:  Negative for difficulty urinating.  Musculoskeletal:  Positive for joint pain, joint pain, myalgias, morning stiffness, muscle tenderness and myalgias. Negative for joint swelling.  Skin:  Negative for color change, rash and redness.  Allergic/Immunologic: Negative for susceptible to infections.  Neurological:  Positive for numbness, headaches, parasthesias and weakness. Negative for dizziness and memory loss.  Hematological:  Positive for  bruising/bleeding tendency.  Psychiatric/Behavioral:  Positive for decreased concentration. Negative for confusion.    PMFS History:  Patient Active Problem List   Diagnosis Date Noted   Prediabetes 10/28/2019   H/o Chronic heart failure with preserved ejection fraction (HFpEF) ([place]) 99991111   Umbilical hernia without obstruction and without gangrene 12/10/2018   Fibroids 05/08/2016   Spondylosis of lumbar region without myelopathy or radiculopathy 12/24/2015   Solitary bone cyst of right shoulder 05/28/2015   Right knee pain 05/28/2015   Osteoarthritis of both shoulders 05/28/2015   Overweight (BMI 25.0-29.9) 04/28/2014   Ganglion cyst of wrist 99991111   Systolic murmur 123456   Diastolic dysfunction [id number]   Seasonal allergies 08/28/2010   Herpes simplex virus (HSV) infection 09/16/2008   Rheumatoid arthritis with rheumatoid factor of multiple sites without organ or systems involvement ([place]) 09/16/2008   Hyperlipidemia LDL goal <100 05/23/2008   Essential hypertension, benign 05/23/2008    Past Medical History:  Diagnosis Date   Acid reflux    Acute cystitis with hematuria 04/11/2011   Anemia    ARF (acute renal failure) ([place]) 12/14/2010   Arthritis    CKD (chronic kidney disease), stage II    previously seen by nephrologist--- dr Florene Glen,  pt released per lov in epic 09-24-2017   Diabetes mellitus, type II ([place])    Diverticulosis of colon    Essential hypertension, benign    followed by pcp   (pt had nuclear stress test (in epic under media) dated 05-25-2008 showed normal perfusion w/ no evidence ischemia, ef  83%   Heart disease    History of bradycardia    previously seen by cardiologist, dr branch, pt released per lov 08-11-2018 in epic, bradycardia resolved   History of sepsis    04-03-2019 hospital admission w/ urosepsis due to obstructive uropathy due to right ureter stone   Hypokalemia    Lipoma of colon    ascending   Lumbar spondylosis    Mixed  hyperlipidemia    Nephrolithiasis    per CT 04-03-2019 nonobstructive bilateral stones   Ocular herpes    Osteoarthritis    followed by dr Estanislado Pandy--- hands, shoulders, feet   Osteoporosis    Rheumatoid arthritis(714.0)    rheumotologist-- dr s. Estanislado Pandy---  multiple sites, treated with methotrexate/ prednisone   Right nephrolithiasis-1.4cm Moderate right-sided urinary tract obstruction/hydronephrosis 04/03/2019   Right ureteral calculus    Seasonal allergies    Sepsis ([place]) 04/03/2019   Sepsis secondary to UTI /Nephrolithiasis with obstructive uropathy 04/03/2019   SUI (stress urinary incontinence, female)    Uterine fibroid     Family History  Problem Relation Age of Onset   Stroke Mother    Cancer Father    Diabetes Sister    Diabetes Sister    Kidney failure Brother    Kidney failure Brother    Pancreatic cancer Sister    Kidney Stones Sister        Only one Kidney   Arthritis Sister    Heart disease Daughter    Past Surgical History:  Procedure Laterality Date   BUNIONECTOMY Right 2012   CATARACT EXTRACTION W/ INTRAOCULAR LENS  IMPLANT, BILATERAL  2009   CHOLECYSTECTOMY  12/17/2010   Procedure: LAPAROSCOPIC CHOLECYSTECTOMY;  Surgeon: Donato Heinz;  Location: [facility];  Service: General;  Laterality: N/A;   COLONOSCOPY  05/22/2011   Procedure: COLONOSCOPY;  Surgeon: Rogene Houston, MD;  Location: [facility];  Service: Endoscopy;  Laterality: N/A;  [place], URETEROSCOPY AND STENT PLACEMENT Right 05/09/2019   Procedure: CYSTOSCOPY WITH RETROGRADE PYELOGRAM, URETEROSCOPY AND STENT PLACEMENT;  Surgeon: Robley Fries, MD;  Location: [facility];  Service: Urology;  Laterality: Right;  1 HR   CYSTOSCOPY WITH STENT PLACEMENT Right 04/03/2019   Procedure: CYSTOSCOPY, RIGHT RETROGRADE PYELOGRAM  WITH STENT PLACEMENT;  Surgeon: Robley Fries, MD;  Location: [facility];  Service: Urology;  Laterality: Right;   EXCISION BONE  CYST Right 2010   RIGHT SHOULDER   GANGLION CYST EXCISION Left 06/02/2012   Procedure: Excision ganglion left dorsal wrist;  Surgeon: Sanjuana Kava, MD;  Location: [facility];  Service: Orthopedics;  Laterality: Left;   HOLMIUM LASER APPLICATION Right 99991111   Procedure: HOLMIUM LASER APPLICATION;  Surgeon: Robley Fries, MD;  Location: [facility];  Service: Urology;  Laterality: Right;   kidney stones     LUMBAR MICRODISCECTOMY  07-16-2000   '@[facility]'$    L 2  -3   POSTERIOR LAMINECTOMY / DECOMPRESSION LUMBAR SPINE  06-26-2009   '@[facility]'$    w/ LATERAL MASS FUSION , L3 -- 5   SHOULDER ARTHROSCOPY WITH DISTAL CLAVICLE RESECTION Right 01-10-2009   '@[facility]'$    w/ DEBRIDEMENT AND EXCISION RECURRENT GANGLION CYST   Social History   Social History Narrative   Husband passed away April 19, 2016. It has been financially harder to pay the bills since his passing, but she is making it. Has grandchildren around which makes it easier.    Immunization History  Administered Date(s) Administered  Fluad Quad(high Dose 65+) 10/19/2019   Influenza Split 11/07/2013   Influenza Whole 10/28/2009, 09/17/2010   Influenza, High Dose Seasonal PF 09/29/2016, 09/15/2018   Influenza,inj,Quad PF,6+ Mos 09/03/2014   Influenza-Unspecified 10/05/2013, 09/29/2016   Moderna Sars-Covid-2 Vaccination 01/17/2019, 02/27/2019, 10/30/2019   PPD Test 05/14/2014, 05/28/2015   Pneumococcal Conjugate-13 08/03/2013   Pneumococcal Polysaccharide-23 08/28/2009   Tdap 04/29/2010     Objective: Vital Signs: BP (!) 144/85 (BP Location: Left Arm, Patient Position: Sitting, Cuff Size: Normal)   Pulse 90   Ht 4' 11.5" (1.511 m)   Wt 133 lb 6.4 oz (60.5 kg)   BMI 26.49 kg/m    Physical Exam Vitals and nursing note reviewed.  Constitutional:      Appearance: She is well-developed.  HENT:     Head: Normocephalic and atraumatic.  Eyes:     Conjunctiva/sclera: Conjunctivae normal.  Cardiovascular:     Rate and Rhythm: Normal  rate and regular rhythm.     Heart sounds: Normal heart sounds.  Pulmonary:     Effort: Pulmonary effort is normal.     Breath sounds: Normal breath sounds.  Abdominal:     General: Bowel sounds are normal.     Palpations: Abdomen is soft.  Musculoskeletal:     Cervical back: Normal range of motion.  Lymphadenopathy:     Cervical: No cervical adenopathy.  Skin:    General: Skin is warm and dry.     Capillary Refill: Capillary refill takes less than 2 seconds.  Neurological:     Mental Status: She is alert and oriented to person, place, and time.  Psychiatric:        Behavior: Behavior normal.     Musculoskeletal Exam: She has stiffness with range of motion of her cervical spine.  She had limited abduction and internal rotation of her left shoulder.  Right shoulder joint also has limited range of motion.  Elbow joints with good range of motion.  She had bilateral MCP PIP and DIP thickening.  Knee joints were in good range of motion with synovial thickening.  She had no tenderness over ankles or MTPs.  CDAI Exam: CDAI Score: 1.4  Patient Global: 2 mm; Provider Global: 2 mm Swollen: 0 ; Tender: 2  Joint Exam 08/14/2020      Right  Left  Glenohumeral      Tender  Cervical Spine   Tender        Investigation: No additional findings.  Imaging: No results found.  Recent Labs: Lab Results  Component Value Date   WBC 7.2 03/12/2020   HGB 13.4 03/12/2020   PLT 283 03/12/2020   NA 139 05/30/2020   K 4.2 05/30/2020   CL 103 05/30/2020   CO2 22 05/30/2020   GLUCOSE 92 05/30/2020   BUN 25 05/30/2020   CREATININE 0.85 05/30/2020   BILITOT 0.6 05/30/2020   ALKPHOS 70 05/30/2020   AST 16 05/30/2020   ALT 10 05/30/2020   PROT 6.7 05/30/2020   ALBUMIN 4.4 05/30/2020   CALCIUM 10.3 05/30/2020   GFRAA 70 03/12/2020    Speciality Comments: No specialty comments available.  Procedures:  No procedures performed Allergies: Aspirin and Sudafed [pseudoephedrine hcl]    Assessment / Plan:     Visit Diagnoses: Rheumatoid arthritis with rheumatoid factor of multiple sites without organ or systems involvement (HCC)-patient has longstanding history of rheumatoid arthritis with pain and discomfort in multiple joints.  She has been experiencing increased pain in her left shoulder recently.  She  has limited range of motion of her bilateral shoulders.  High risk medication use - Methotrexate 3 tablets by mouth every 7 days, folic acid 1 mg daily, and prednisone 1 mg 4 tablets daily - Plan: CBC with Differential/Platelet, COMPLETE METABOLIC PANEL WITH GFR today and then every 3 months to monitor for drug toxicity.  She was also advised regarding immunization.  Handout was placed in the AVS.  She was advised to stop methotrexate in case she develops an infection and resume medication once the infection resolves.  Primary osteoarthritis of both hands - X-rays are consistent with severe osteoarthritis and rheumatoid arthritis overlap.  She had no active synovitis she has positive synovial thickening.  Primary osteoarthritis of both shoulders-she has limited range of motion of her shoulders.  She has been having discomfort in her left shoulder and has limited abduction.  I advised over-the-counter Salonpas.  No synovitis was noted.  Primary osteoarthritis of both knees-she has severe osteoarthritis in her knee joints with chronic pain.  No warmth or effusion was noted.  Primary osteoarthritis of both feet -  X-rays are consistent with severe rheumatoid arthritis and osteoarthritis overlap.  Spondylosis of lumbar region without myelopathy or radiculopathy-she has chronic lower back pain.  Age-related osteoporosis without current pathological fracture - DEXA ordered on 07/31/19 by Dr. Moshe Cipro: Left femur Neck:  Osteopenia T-score -2.0, BMD 0.764 g/cm2. Fosamax 70 mg 1 tablet by mouth once weekly.  Acute cystitis without hematuria  Essential hypertension-blood pressure is  elevated today.  Have advised her to monitor blood pressure closely.  Systolic murmur  Type 2 diabetes, diet controlled ([place])  Orders: Orders Placed This Encounter  Procedures   CBC with Differential/Platelet   COMPLETE METABOLIC PANEL WITH GFR   No orders of the defined types were placed in this encounter.    Follow-Up Instructions: Return in about 5 months (around 01/14/2021) for Rheumatoid arthritis.   Bo Merino, MD  Note - This record has been created using Editor, commissioning.  Chart creation errors have been sought, but may not always  have been located. Such creation errors do not reflect on  the standard of medical care.

## 2020-08-12 ENCOUNTER — Other Ambulatory Visit: Payer: Self-pay | Admitting: Family Medicine

## 2020-08-12 DIAGNOSIS — I10 Essential (primary) hypertension: Secondary | ICD-10-CM

## 2020-08-12 DIAGNOSIS — R7989 Other specified abnormal findings of blood chemistry: Secondary | ICD-10-CM

## 2020-08-12 DIAGNOSIS — E1169 Type 2 diabetes mellitus with other specified complication: Secondary | ICD-10-CM

## 2020-08-12 DIAGNOSIS — E559 Vitamin D deficiency, unspecified: Secondary | ICD-10-CM

## 2020-08-12 DIAGNOSIS — M0579 Rheumatoid arthritis with rheumatoid factor of multiple sites without organ or systems involvement: Secondary | ICD-10-CM

## 2020-08-12 DIAGNOSIS — E785 Hyperlipidemia, unspecified: Secondary | ICD-10-CM

## 2020-08-14 ENCOUNTER — Other Ambulatory Visit: Payer: Self-pay

## 2020-08-14 ENCOUNTER — Encounter: Payer: Self-pay | Admitting: Rheumatology

## 2020-08-14 ENCOUNTER — Ambulatory Visit: Payer: Medicare HMO | Admitting: Rheumatology

## 2020-08-14 VITALS — BP 144/85 | HR 90 | Ht 59.5 in | Wt 133.4 lb

## 2020-08-14 DIAGNOSIS — M47816 Spondylosis without myelopathy or radiculopathy, lumbar region: Secondary | ICD-10-CM

## 2020-08-14 DIAGNOSIS — M810 Age-related osteoporosis without current pathological fracture: Secondary | ICD-10-CM | POA: Diagnosis not present

## 2020-08-14 DIAGNOSIS — N3000 Acute cystitis without hematuria: Secondary | ICD-10-CM

## 2020-08-14 DIAGNOSIS — M19012 Primary osteoarthritis, left shoulder: Secondary | ICD-10-CM

## 2020-08-14 DIAGNOSIS — R011 Cardiac murmur, unspecified: Secondary | ICD-10-CM

## 2020-08-14 DIAGNOSIS — M0579 Rheumatoid arthritis with rheumatoid factor of multiple sites without organ or systems involvement: Secondary | ICD-10-CM

## 2020-08-14 DIAGNOSIS — M170 Bilateral primary osteoarthritis of knee: Secondary | ICD-10-CM

## 2020-08-14 DIAGNOSIS — M19041 Primary osteoarthritis, right hand: Secondary | ICD-10-CM

## 2020-08-14 DIAGNOSIS — M19042 Primary osteoarthritis, left hand: Secondary | ICD-10-CM

## 2020-08-14 DIAGNOSIS — M19072 Primary osteoarthritis, left ankle and foot: Secondary | ICD-10-CM

## 2020-08-14 DIAGNOSIS — Z79899 Other long term (current) drug therapy: Secondary | ICD-10-CM

## 2020-08-14 DIAGNOSIS — M19011 Primary osteoarthritis, right shoulder: Secondary | ICD-10-CM

## 2020-08-14 DIAGNOSIS — M19071 Primary osteoarthritis, right ankle and foot: Secondary | ICD-10-CM | POA: Diagnosis not present

## 2020-08-14 DIAGNOSIS — E119 Type 2 diabetes mellitus without complications: Secondary | ICD-10-CM

## 2020-08-14 DIAGNOSIS — I10 Essential (primary) hypertension: Secondary | ICD-10-CM | POA: Diagnosis not present

## 2020-08-14 LAB — CBC WITH DIFFERENTIAL/PLATELET
Absolute Monocytes: 563 cells/uL (ref 200–950)
Basophils Absolute: 38 cells/uL (ref 0–200)
Basophils Relative: 0.6 %
Eosinophils Absolute: 102 cells/uL (ref 15–500)
Eosinophils Relative: 1.6 %
HCT: 41.5 % (ref 35.0–45.0)
Hemoglobin: 13.5 g/dL (ref 11.7–15.5)
Lymphs Abs: 2086 cells/uL (ref 850–3900)
MCH: 30.3 pg (ref 27.0–33.0)
MCHC: 32.5 g/dL (ref 32.0–36.0)
MCV: 93.3 fL (ref 80.0–100.0)
MPV: 9.2 fL (ref 7.5–12.5)
Monocytes Relative: 8.8 %
Neutro Abs: 3610 cells/uL (ref 1500–7800)
Neutrophils Relative %: 56.4 %
Platelets: 258 10*3/uL (ref 140–400)
RBC: 4.45 10*6/uL (ref 3.80–5.10)
RDW: 14 % (ref 11.0–15.0)
Total Lymphocyte: 32.6 %
WBC: 6.4 10*3/uL (ref 3.8–10.8)

## 2020-08-14 LAB — COMPLETE METABOLIC PANEL WITH GFR
AG Ratio: 1.7 (calc) (ref 1.0–2.5)
ALT: 8 U/L (ref 6–29)
AST: 13 U/L (ref 10–35)
Albumin: 4.3 g/dL (ref 3.6–5.1)
Alkaline phosphatase (APISO): 54 U/L (ref 37–153)
BUN/Creatinine Ratio: 31 (calc) — ABNORMAL HIGH (ref 6–22)
BUN: 29 mg/dL — ABNORMAL HIGH (ref 7–25)
CO2: 29 mmol/L (ref 20–32)
Calcium: 11 mg/dL — ABNORMAL HIGH (ref 8.6–10.4)
Chloride: 106 mmol/L (ref 98–110)
Creat: 0.95 mg/dL (ref 0.60–0.95)
Globulin: 2.5 g/dL (calc) (ref 1.9–3.7)
Glucose, Bld: 107 mg/dL — ABNORMAL HIGH (ref 65–99)
Potassium: 3.7 mmol/L (ref 3.5–5.3)
Sodium: 144 mmol/L (ref 135–146)
Total Bilirubin: 0.6 mg/dL (ref 0.2–1.2)
Total Protein: 6.8 g/dL (ref 6.1–8.1)
eGFR: 58 mL/min/{1.73_m2} — ABNORMAL LOW (ref >=60)

## 2020-08-14 NOTE — Patient Instructions (Signed)
Standing Labs We placed an order today for your standing lab work.   Please have your standing labs drawn in November and every 3 months  If possible, please have your labs drawn 2 weeks prior to your appointment so that the provider can discuss your results at your appointment.  Please note that you may see your imaging and lab results in [place] before we have reviewed them. We may be awaiting multiple results to interpret others before contacting you. Please allow our office up to 72 hours to thoroughly review all of the results before contacting the office for clarification of your results.  We have open lab daily: Monday through Thursday from 1:30-4:30 PM and Friday from 1:30-4:00 PM at the office of Dr. Bo Merino, [facility].   Please be advised, all patients with office appointments requiring lab work will take precedent over walk-in lab work.  If possible, please come for your lab work on Monday and Friday afternoons, as you may experience shorter wait times. The office is located at [address] [place], [place], [place] [address] No appointment is necessary.   Labs are drawn by [facility]. Please bring your co-pay at the time of your lab draw.  You may receive a bill from [facility] for your lab work.  If you wish to have your labs drawn at another location, please call the office 24 hours in advance to send orders.  If you have any questions regarding directions or hours of operation,  please call [phone].   As a reminder, please drink plenty of water prior to coming for your lab work. Thanks!   COVID-19 vaccine recommendations:   COVID-19 vaccine is recommended for everyone (unless you are allergic to a vaccine component), even if you are on a medication that suppresses your immune system.   If you are on Methotrexate, Cellcept (mycophenolate), Rinvoq, Morrie Sheldon, and Olumiant- hold the medication for 1 week after each vaccine. Hold Methotrexate for 2 weeks  after the single dose COVID-19 vaccine.   If you are on Orencia subcutaneous injection - hold medication one week prior to and one week after the first COVID-19 vaccine dose (only).   If you are on Orencia IV infusions- time vaccination administration so that the first COVID-19 vaccination will occur four weeks after the infusion and postpone the subsequent infusion by one week.   If you are on Cyclophosphamide or Rituxan infusions please contact your doctor prior to receiving the COVID-19 vaccine.   Do not take Tylenol or any anti-inflammatory medications (NSAIDs) 24 hours prior to the COVID-19 vaccination.   There is no direct evidence about the efficacy of the COVID-19 vaccine in individuals who are on medications that suppress the immune system.   Even if you are fully vaccinated, and you are on any medications that suppress your immune system, please continue to wear a mask, maintain at least six feet social distance and practice hand hygiene.   If you develop a COVID-19 infection, please contact your PCP or our office to determine if you need monoclonal antibody infusion.  The booster vaccine is now available for immunocompromised patients.   Please see the following web sites for updated information.   [email or web address]  Vaccines You are taking a medication(s) that can suppress your immune system.  The following immunizations are recommended: Flu annually Covid-19  Td/Tdap (tetanus, diphtheria, pertussis) every 10 years Pneumonia (Prevnar 15 then Pneumovax 23 at least 1 year apart.  Alternatively, can take Prevnar 20 without needing additional  dose) Shingrix (after age 85): 2 doses from 4 weeks to 6 months apart  Please check with your PCP to make sure you are up to date.   If you test POSITIVE for COVID19 and have MILD to MODERATE symptoms: First, call your PCP if you would like to receive COVID19 treatment  AND Hold your medications during the infection and for at least 1 week after your symptoms have resolved: Injectable medication (Benlysta, Cimzia, Cosentyx, Enbrel, Humira, Orencia, Remicade, Simponi, Stelara, Taltz, Tremfya) Methotrexate Leflunomide (Arava) Azathioprine Mycophenolate (Cellcept) Roma Kayser, or Rinvoq Otezla If you take Actemra or Kevzara, you DO NOT need to hold these for COVID19 infection.  If you test POSITIVE for COVID19 and have NO symptoms: First, call your PCP if you would like to receive COVID19 treatment AND Hold your medications for at least 10 days after the day that you tested positive Injectable medication (Benlysta, Cimzia, Cosentyx, Enbrel, Humira, Orencia, Remicade, Simponi, Stelara, Taltz, Tremfya) Methotrexate Leflunomide (Arava) Azathioprine Mycophenolate (Cellcept) Roma Kayser, or Rinvoq Otezla If you take Actemra or Kevzara, you DO NOT need to hold these for COVID19 infection.  If you have signs or symptoms of an infection or start antibiotics: First, call your PCP for workup of your infection. Hold your medication through the infection, until you complete your antibiotics, and until symptoms resolve if you take the following: Injectable medication (Actemra, Benlysta, Cimzia, Cosentyx, Enbrel, Humira, Kevzara, Orencia, Remicade, Simponi, Stelara, Taltz, Tremfya) Methotrexate Leflunomide (Arava) Mycophenolate (Cellcept) Morrie Sheldon, Olumiant, or Rinvoq

## 2020-08-15 ENCOUNTER — Telehealth: Payer: Self-pay

## 2020-08-15 NOTE — Telephone Encounter (Signed)
Patients grand daughter called says her grandmother is having a headache x3 days now with no help from tylenol can we suggest something or call in something for her ph# [phone]

## 2020-08-15 NOTE — Progress Notes (Signed)
CBC is normal.  Glucose is mildly elevated probably not a fasting sample.  Calcium is elevated.  Please advise patient to decrease calcium intake.  GFR is low, most likely due to the use of HCTZ.

## 2020-08-15 NOTE — Telephone Encounter (Signed)
Spoke with pt granddaughter. Appt made.

## 2020-08-16 ENCOUNTER — Ambulatory Visit: Payer: Medicare HMO | Admitting: Nurse Practitioner

## 2020-08-19 ENCOUNTER — Other Ambulatory Visit: Payer: Self-pay

## 2020-08-19 ENCOUNTER — Ambulatory Visit (INDEPENDENT_AMBULATORY_CARE_PROVIDER_SITE_OTHER): Payer: Medicare HMO | Admitting: Podiatry

## 2020-08-19 DIAGNOSIS — M79676 Pain in unspecified toe(s): Secondary | ICD-10-CM | POA: Diagnosis not present

## 2020-08-19 DIAGNOSIS — E0843 Diabetes mellitus due to underlying condition with diabetic autonomic (poly)neuropathy: Secondary | ICD-10-CM

## 2020-08-19 DIAGNOSIS — B351 Tinea unguium: Secondary | ICD-10-CM | POA: Diagnosis not present

## 2020-08-19 NOTE — Progress Notes (Signed)
   SUBJECTIVE Patient with a history of diabetes mellitus presents to office today complaining of elongated, thickened nails that cause pain while ambulating in shoes. She is unable to trim her own nails. Patient is here for further evaluation and treatment.   Past Medical History:  Diagnosis Date   Acid reflux    Acute cystitis with hematuria 04/11/2011   Anemia    ARF (acute renal failure) ([place]) 12/14/2010   Arthritis    CKD (chronic kidney disease), stage II    previously seen by nephrologist--- dr Florene Glen,  pt released per lov in epic 09-24-2017   Diabetes mellitus, type II ([place])    Diverticulosis of colon    Essential hypertension, benign    followed by pcp   (pt had nuclear stress test (in epic under media) dated 05-25-2008 showed normal perfusion w/ no evidence ischemia, ef 83%   Heart disease    History of bradycardia    previously seen by cardiologist, dr branch, pt released per lov 08-11-2018 in epic, bradycardia resolved   History of sepsis    04-03-2019 hospital admission w/ urosepsis due to obstructive uropathy due to right ureter stone   Hypokalemia    Lipoma of colon    ascending   Lumbar spondylosis    Mixed hyperlipidemia    Nephrolithiasis    per CT 04-03-2019 nonobstructive bilateral stones   Ocular herpes    Osteoarthritis    followed by dr Estanislado Pandy--- hands, shoulders, feet   Osteoporosis    Rheumatoid arthritis(714.0)    rheumotologist-- dr s. Estanislado Pandy---  multiple sites, treated with methotrexate/ prednisone   Right nephrolithiasis-1.4cm Moderate right-sided urinary tract obstruction/hydronephrosis 04/03/2019   Right ureteral calculus    Seasonal allergies    Sepsis ([place]) 04/03/2019   Sepsis secondary to UTI /Nephrolithiasis with obstructive uropathy 04/03/2019   SUI (stress urinary incontinence, female)    Uterine fibroid     OBJECTIVE General Patient is awake, alert, and oriented x 3 and in no acute distress. Derm Skin is dry and supple bilateral.  Negative open lesions or macerations. Remaining integument unremarkable. Nails are tender, long, thickened and dystrophic with subungual debris, consistent with onychomycosis, 1-5 bilateral. No signs of infection noted. Vasc  DP and PT pedal pulses palpable bilaterally. Temperature gradient within normal limits.  Neuro Epicritic and protective threshold sensation diminished bilaterally.  Musculoskeletal Exam No symptomatic pedal deformities noted bilateral. Muscular strength within normal limits.  ASSESSMENT 1. Diabetes Mellitus w/ peripheral neuropathy 2. Onychomycosis of nail due to dermatophyte bilateral 3. Pain in foot bilateral  PLAN OF CARE 1. Patient evaluated today. 2. Instructed to maintain good pedal hygiene and foot care. Stressed importance of controlling blood sugar.  3. Mechanical debridement of nails 1-5 bilaterally performed using a nail nipper. Filed with dremel without incident.  4. Return to clinic in 3 mos.     Edrick Kins, DPM [facility]  Dr. Edrick Kins, DPM    [address], [place] [address]                Office [phone]  Fax [phone]

## 2020-08-20 ENCOUNTER — Telehealth: Payer: Self-pay

## 2020-08-20 NOTE — Telephone Encounter (Signed)
What do you recommend?

## 2020-08-20 NOTE — Telephone Encounter (Signed)
Patient grandaughter Stacy Meyers called still having bad headaches is there anything can be called into her pharmacy.  Has a follow up appointment Thursday   Pharmacy: [facility]

## 2020-08-21 NOTE — Telephone Encounter (Signed)
Granddaughter aware

## 2020-08-22 ENCOUNTER — Other Ambulatory Visit: Payer: Self-pay

## 2020-08-22 ENCOUNTER — Ambulatory Visit (INDEPENDENT_AMBULATORY_CARE_PROVIDER_SITE_OTHER): Payer: Medicare HMO | Admitting: Family Medicine

## 2020-08-22 ENCOUNTER — Encounter: Payer: Self-pay | Admitting: Family Medicine

## 2020-08-22 VITALS — BP 160/80 | HR 55 | Resp 16 | Ht 60.0 in | Wt 139.0 lb

## 2020-08-22 DIAGNOSIS — G441 Vascular headache, not elsewhere classified: Secondary | ICD-10-CM | POA: Diagnosis not present

## 2020-08-22 DIAGNOSIS — I10 Essential (primary) hypertension: Secondary | ICD-10-CM

## 2020-08-22 MED ORDER — BUTALBITAL-APAP-CAFFEINE 50-325-40 MG PO TABS: ORAL_TABLET | ORAL | 0 refills | Status: DC

## 2020-08-22 NOTE — Patient Instructions (Addendum)
Annual exam 10/20 or after, call if you need me sooner, re evaluate blood pressure and headaches  STOP hydrochlorothiazide 12.5 mg  STOP Potassium  Start amlodipine 5 mg one every day for blood pressure on 08/23/2020  New for headache is fioricet tablet  Blood pressure is high and this is contributing to your headache  Non fasting chem 7 and EGFR in 4 weeks  Thanks for choosing [facility], we consider it a privelige to serve you.

## 2020-08-22 NOTE — Progress Notes (Signed)
   Stacy Meyers     MRN: [id number]      DOB: November 21, 1932   HPI Stacy Meyers is here WITH A 1 WEEK H/O NAGGING HEADACHE ACROSS HER FOREHEAD, NO NEW WEAKNESS , C/O BILATERAL TINGLING IN HER HANDS, NO DIFFICULTY WITH SPEECH OR WALKING  Has stopped amlodipine in recent times, unclear why ROS Denies recent fever or chills. Denies sinus pressure, nasal congestion, ear pain or sore throat. Denies chest congestion, productive cough or wheezing. Denies chest pains, palpitations and leg swelling Denies abdominal pain, nausea, vomiting,diarrhea or constipation.   Denies dysuria, frequency, hesitancy or incontinence Chronic  joint pain, swelling and limitation in mobility.  Denies depression, anxiety or insomnia. Denies skin break down or rash.   PE  BP (!) 160/80   Pulse (!) 55   Resp 16   Ht 5' (1.524 m)   Wt 139 lb (63 kg)   SpO2 95%   BMI 27.15 kg/m    Patient alert and oriented and in no cardiopulmonary distress.  HEENT: No facial asymmetry, EOMI,     Neck supple .  Chest: Clear to auscultation bilaterally.  CVS: S1, S2 no murmurs, no S3.Regular rate.  ABD: Soft non tender.   Ext: No edema  MS: Adequate ROM spine, shoulders, hips and knees.  Skin: Intact, no ulcerations or rash noted.  Psych: Good eye contact, normal affect. Memory intact not anxious or depressed appearing.  CNS: CN 2-12 intact, power,  normal throughout.no focal deficits noted.   Assessment & Plan  Essential hypertension, benign Uncontrolled with potentially adverse s/e from HCTZ, d/c same and start amlodipine, re eval in 4 to 6 weeks  Headache Trial of fioricet in limited quantity and control BP, no new neurologic symptoms, no focal deficits on exam

## 2020-08-26 ENCOUNTER — Encounter: Payer: Self-pay | Admitting: Family Medicine

## 2020-08-26 DIAGNOSIS — R519 Headache, unspecified: Secondary | ICD-10-CM | POA: Insufficient documentation

## 2020-08-26 NOTE — Assessment & Plan Note (Addendum)
Trial of fioricet in limited quantity and control BP, no new neurologic symptoms, no focal deficits on exam

## 2020-08-26 NOTE — Assessment & Plan Note (Signed)
Uncontrolled with potentially adverse s/e from HCTZ, d/c same and start amlodipine, re eval in 4 to 6 weeks

## 2020-09-11 ENCOUNTER — Ambulatory Visit: Payer: Medicare HMO | Admitting: Family Medicine

## 2020-09-23 ENCOUNTER — Other Ambulatory Visit: Payer: Self-pay | Admitting: Physician Assistant

## 2020-09-23 NOTE — Telephone Encounter (Signed)
Next Visit: 01/15/2021  Last Visit: 08/14/2020  Last Fill: 05/30/2020  Dx: Rheumatoid arthritis with rheumatoid factor of multiple sites without organ or systems involvement   Current Dose per office note on 08/14/2020: prednisone 1 mg 4 tablets daily   Okay to refill Prednisone?

## 2020-10-24 ENCOUNTER — Other Ambulatory Visit: Payer: Self-pay

## 2020-10-24 ENCOUNTER — Ambulatory Visit (INDEPENDENT_AMBULATORY_CARE_PROVIDER_SITE_OTHER): Payer: Medicare HMO | Admitting: Family Medicine

## 2020-10-24 ENCOUNTER — Encounter: Payer: Self-pay | Admitting: Family Medicine

## 2020-10-24 VITALS — BP 142/70 | HR 66 | Resp 18 | Ht 59.0 in | Wt 136.1 lb

## 2020-10-24 DIAGNOSIS — E785 Hyperlipidemia, unspecified: Secondary | ICD-10-CM

## 2020-10-24 DIAGNOSIS — Z0000 Encounter for general adult medical examination without abnormal findings: Secondary | ICD-10-CM | POA: Diagnosis not present

## 2020-10-24 DIAGNOSIS — R2242 Localized swelling, mass and lump, left lower limb: Secondary | ICD-10-CM | POA: Diagnosis not present

## 2020-10-24 DIAGNOSIS — I10 Essential (primary) hypertension: Secondary | ICD-10-CM

## 2020-10-24 DIAGNOSIS — N3000 Acute cystitis without hematuria: Secondary | ICD-10-CM | POA: Diagnosis not present

## 2020-10-24 DIAGNOSIS — R7303 Prediabetes: Secondary | ICD-10-CM

## 2020-10-24 LAB — POCT URINALYSIS DIP (CLINITEK)
Bilirubin, UA: NEGATIVE
Glucose, UA: NEGATIVE mg/dL
Ketones, POC UA: NEGATIVE mg/dL
Leukocytes, UA: NEGATIVE
Nitrite, UA: NEGATIVE
POC PROTEIN,UA: NEGATIVE
Spec Grav, UA: 1.015 (ref 1.010–1.025)
Urobilinogen, UA: 0.2 E.U./dL
pH, UA: 7 (ref 5.0–8.0)

## 2020-10-24 MED ORDER — BUTALBITAL-APAP-CAFFEINE 50-325-40 MG PO TABS: ORAL_TABLET | ORAL | 0 refills | Status: DC

## 2020-10-24 NOTE — Assessment & Plan Note (Signed)

## 2020-10-24 NOTE — Patient Instructions (Addendum)
F/U in early  March, call if you need me sooner  Annual exam in 366 days  CCUA today, and C/s if abnormal  Covid vaccine is needed  Lipid, cmp and eGFr and HBA1C today  Fioricet sent in limited supply for severe headache not responding to tylenol  [place] of swelling on left leg is ordered , we will call with appt info  Thanks for choosing [facility], we consider it a privelige to serve you.

## 2020-10-25 LAB — CMP14+EGFR
ALT: 8 IU/L (ref 0–32)
AST: 15 IU/L (ref 0–40)
Albumin/Globulin Ratio: 1.9 (ref 1.2–2.2)
Albumin: 4.5 g/dL (ref 3.6–4.6)
Alkaline Phosphatase: 64 IU/L (ref 44–121)
BUN/Creatinine Ratio: 23 (ref 12–28)
BUN: 21 mg/dL (ref 8–27)
Bilirubin Total: 0.4 mg/dL (ref 0.0–1.2)
CO2: 25 mmol/L (ref 20–29)
Calcium: 10 mg/dL (ref 8.7–10.3)
Chloride: 103 mmol/L (ref 96–106)
Creatinine, Ser: 0.92 mg/dL (ref 0.57–1.00)
Globulin, Total: 2.4 g/dL (ref 1.5–4.5)
Glucose: 91 mg/dL (ref 70–99)
Potassium: 4.1 mmol/L (ref 3.5–5.2)
Sodium: 142 mmol/L (ref 134–144)
Total Protein: 6.9 g/dL (ref 6.0–8.5)
eGFR: 61 mL/min/{1.73_m2} (ref >59)

## 2020-10-25 LAB — LIPID PANEL
Chol/HDL Ratio: 2.7 ratio (ref 0.0–4.4)
Cholesterol, Total: 157 mg/dL (ref 100–199)
HDL: 59 mg/dL (ref >39)
LDL Chol Calc (NIH): 80 mg/dL (ref 0–99)
Triglycerides: 100 mg/dL (ref 0–149)
VLDL Cholesterol Cal: 18 mg/dL (ref 5–40)

## 2020-10-25 LAB — HEMOGLOBIN A1C
Est. average glucose Bld gHb Est-mCnc: 128 mg/dL
Hgb A1c MFr Bld: 6.1 % — ABNORMAL HIGH (ref 4.8–5.6)

## 2020-10-27 DIAGNOSIS — N3000 Acute cystitis without hematuria: Secondary | ICD-10-CM | POA: Insufficient documentation

## 2020-10-27 LAB — URINE CULTURE

## 2020-10-27 NOTE — Progress Notes (Signed)
    Stacy Meyers     MRN: [id number]      DOB: Sep 29, 1932  HPI: Patient is in for annual physical exam.. C/o painless swelling on left leg for over 1 year Intermittent headaches , needs refill on fioricet  Immunization is reviewed , and  is up to date.Needs covid vaccine   PE: BP (!) 142/70   Pulse 66   Resp 18   Ht 4\' 11"  (1.499 m)   Wt 136 lb 1.9 oz (61.7 kg)   SpO2 97%   BMI 27.49 kg/m   Pleasant  female, alert and oriented x 3, in no cardio-pulmonary distress. Afebrile. HEENT No facial trauma or asymetry. Sinuses non tender.  Extra occullar muscles intact.. External ears normal, . Neck: decreased though adequate ROM, no adenopathy,JVD or thyromegaly.No bruits.  Chest: Clear to ascultation bilaterally.No crackles or wheezes. Non tender to palpation  Cardiovascular system; Heart sounds normal,  S1 and  S2 ,no S3.  Systolic  murmur, no  thrill. Apical beat not displaced Peripheral pulses normal.  Abdomen: Soft, non tender, nNo guarding, tenderness or rebound.   .   Musculoskeletal exam: Decreased  ROM of spine, hips , shoulders and knees.  deformity ,swelling oand crepitus noted. No muscle wasting or atrophy.   Neurologic: Cranial nerves 2 to 12 intact. Power, tone ,sensation and reflexes normal throughout.  disturbance in gait. No tremor.  Skin: Intact, no ulceration, erythema , scaling or rash noted. Lipoma left leg, non tender  Pigmentation normal throughout  Psych; Normal mood and affect. Judgement and concentration normal   Assessment & Plan:  Annual physical exam Annual exam as documented. Counseling done  re healthy lifestyle involving commitment to 150 minutes exercise per week, heart healthy diet, and attaining healthy weight.The importance of adequate sleep also discussed. Regular seat belt use and home safety, is also discussed. Changes in health habits are decided on by the patient with goals and time frames  set for achieving  them. Immunization and cancer screening needs are specifically addressed at this visit.   Acute cystitis without hematuria Recurrent uti, mildly symptomatic, CCUA then c/s if abnormal  Mass of left lower leg Over 12 month history, [place] to further evaluate

## 2020-10-27 NOTE — Assessment & Plan Note (Signed)
Recurrent uti, mildly symptomatic, CCUA then c/s if abnormal

## 2020-10-27 NOTE — Assessment & Plan Note (Signed)
Over 12 month history, [place] to further evaluate

## 2020-10-29 ENCOUNTER — Other Ambulatory Visit: Payer: Self-pay

## 2020-10-29 ENCOUNTER — Ambulatory Visit (HOSPITAL_COMMUNITY)
Admission: RE | Admit: 2020-10-29 | Discharge: 2020-10-29 | Disposition: A | Discharge status: Home / Self Care | Payer: Medicare HMO | Source: Ambulatory Visit | Attending: Family Medicine | Admitting: Family Medicine

## 2020-10-29 ENCOUNTER — Other Ambulatory Visit: Payer: Self-pay | Admitting: Family Medicine

## 2020-10-29 DIAGNOSIS — R2241 Localized swelling, mass and lump, right lower limb: Secondary | ICD-10-CM | POA: Diagnosis not present

## 2020-10-29 DIAGNOSIS — R2242 Localized swelling, mass and lump, left lower limb: Secondary | ICD-10-CM

## 2020-10-31 ENCOUNTER — Ambulatory Visit (HOSPITAL_COMMUNITY): Payer: Medicare HMO

## 2020-12-09 ENCOUNTER — Telehealth: Payer: Self-pay | Admitting: *Deleted

## 2020-12-09 NOTE — Chronic Care Management (AMB) (Signed)
  Chronic Care Management   Outreach Note  12/09/2020 Name: Stacy Meyers MRN: [id number] DOB: 1932/09/18  Stacy Meyers is a 87 y.o. year old female who is a primary care patient of Fayrene Helper, MD. I reached out to Stacy Meyers by phone today in response to a referral sent by Ms. Adah G Burich's primary care provider.  An unsuccessful telephone outreach was attempted today. The patient was referred to the case management team for assistance with care management and care coordination.   Follow Up Plan: The care management team will reach out to the patient again over the next 7 days.  If patient returns call to provider office, please advise to call Robeline* at [phone].*  [facility]  Direct Dial: [phone]

## 2020-12-16 NOTE — Chronic Care Management (AMB) (Signed)
  Chronic Care Management   Note  12/16/2020 Name: Stacy Meyers MRN: [id number] DOB: Oct 19, 1932  Stacy Meyers is a 87 y.o. year old female who is a primary care patient of Fayrene Helper, MD. I reached out to Stacy Meyers by phone today in response to a referral sent by Stacy Meyers's PCP.  Stacy Meyers was given information about Chronic Care Management services today including:  CCM service includes personalized support from designated clinical staff supervised by her physician, including individualized plan of care and coordination with other care providers 24/7 contact phone numbers for assistance for urgent and routine care needs. Service will only be billed when office clinical staff spend 20 minutes or more in a month to coordinate care. Only one practitioner may furnish and bill the service in a calendar month. The patient may stop CCM services at any time (effective at the end of the month) by phone call to the office staff. The patient is responsible for co-pay (up to 20% after annual deductible is met) if co-pay is required by the individual health plan.   Patient agreed to services and verbal consent obtained.   Follow up plan: Telephone appointment with care management team member scheduled for:12/25/20  [facility]  Direct Dial: [phone]

## 2020-12-23 ENCOUNTER — Telehealth: Payer: Self-pay

## 2020-12-23 ENCOUNTER — Other Ambulatory Visit: Payer: Self-pay

## 2020-12-23 ENCOUNTER — Ambulatory Visit (HOSPITAL_COMMUNITY)
Admission: RE | Admit: 2020-12-23 | Discharge: 2020-12-23 | Disposition: A | Discharge status: Home / Self Care | Payer: Medicare HMO | Source: Ambulatory Visit | Attending: Urology | Admitting: Urology

## 2020-12-23 DIAGNOSIS — N200 Calculus of kidney: Secondary | ICD-10-CM | POA: Diagnosis not present

## 2020-12-23 DIAGNOSIS — N281 Cyst of kidney, acquired: Secondary | ICD-10-CM | POA: Diagnosis not present

## 2020-12-23 NOTE — Telephone Encounter (Signed)
-----   Message from Irine Seal, MD sent at 12/23/2020  4:48 PM EST ----- Stable stones and cysts.  F/u as planned.  ----- Message ----- From: Iris Pert, LPN Sent: 11/07/1592   1:08 PM EST To: Irine Seal, MD  Please review

## 2020-12-23 NOTE — Telephone Encounter (Signed)
Patient called with no answer. Detailed message left on voicemail

## 2020-12-25 ENCOUNTER — Ambulatory Visit (INDEPENDENT_AMBULATORY_CARE_PROVIDER_SITE_OTHER): Payer: Medicare HMO | Admitting: Pharmacist

## 2020-12-25 DIAGNOSIS — I10 Essential (primary) hypertension: Secondary | ICD-10-CM

## 2020-12-25 DIAGNOSIS — M818 Other osteoporosis without current pathological fracture: Secondary | ICD-10-CM

## 2020-12-25 DIAGNOSIS — E785 Hyperlipidemia, unspecified: Secondary | ICD-10-CM

## 2020-12-25 DIAGNOSIS — M0579 Rheumatoid arthritis with rheumatoid factor of multiple sites without organ or systems involvement: Secondary | ICD-10-CM

## 2020-12-25 DIAGNOSIS — R7303 Prediabetes: Secondary | ICD-10-CM

## 2020-12-25 NOTE — Patient Instructions (Signed)
Stacy Meyers,  It was great to talk to you today!  Please call me with any questions or concerns.   Visit Information   Following is a copy of your full care plan:  Care Plan : Medication Management  Updates made by Beryle Lathe, Catharine since 12/25/2020 12:00 AM     Problem: HTN, HLD, RA, Osteoporosis, Pre-DM   Priority: High  Onset Date: 12/25/2020     Long-Range Goal: Disease Progression Prevention   Start Date: 12/25/2020  Expected End Date: 03/25/2021  This Visit's Progress: On track  Priority: High  Note:   Current Barriers:  Unable to independently monitor therapeutic efficacy Unable to achieve control of hypertension  Pharmacist Clinical Goal(s):  Through collaboration with PharmD and provider, patient will  Achieve adherence to monitoring guidelines and medication adherence to achieve therapeutic efficacy Achieve control of hypertension as evidenced by improved blood pressure control   Interventions: 1:1 collaboration with Fayrene Helper, MD regarding development and update of comprehensive plan of care as evidenced by provider attestation and co-signature Inter-disciplinary care team collaboration (see longitudinal plan of care) Comprehensive medication review performed; medication list updated in electronic medical record  Pre-diabetes - New goal.: Controlled; Most recent A1c  6.1 Current medications:  none Intolerances: none Taking medications as directed: n/a Side effects thought to be attributed to current medication regimen: n/a Current meal patterns: not discussed today On a statin: yes On aspirin 81 mg daily: no Last microalbumin/creatinine ratio: 167 (03/07/20); on an ACEi/ARB: no Current glucose readings:  not discussed today Continue to monitor blood glucose and A1c every 6-12 months  Hypertension - New goal.: Blood pressure under poor control. Blood pressure is above goal of <140/90 mmHg per 2022 AAFP guidelines. Current medications:  amlodipine 5 mg by mouth once daily and hydrochlorothiazide 12.5 mg by mouth once daily Intolerances: none Taking medications as directed: yes Side effects thought to be attributed to current medication regimen: no Current exercise: not discussed today Current home blood pressure: patient reports she does not check often Encourage dietary sodium restriction/DASH diet Recommend home blood pressure monitoring to discuss at next visit Discussed need for medication compliance Continue current medications as above for now. If blood pressure continues to be elevated, may consider adding low dose ARB due to albuminuria  Hyperlipidemia - New goal.: Controlled. LDL at goal of <100 due to moderate risk given <2 major CV risk factors (hypertension) and 10-year risk <10% per 2020 AACE/ACE guidelines. Triglycerides at goal of <150 per 2020 AACE/ACE guidelines. Current medications: rosuvastatin 5 mg by mouth every other day Intolerances: none Taking medications as directed: yes Side effects thought to be attributed to current medication regimen: no Continue rosuvastatin 5 mg by mouth every other day Encourage dietary reduction of high fat containing foods such as butter, nuts, bacon, egg yolks, etc.  Osteoporosis - New goal.: Followed by Dr. Dorris Fetch (endocrinology) Appropriately managed Current medications: alendronate (Fosamax) 70 mg by mouth once weekly (every Thursday) Patient does not take calcium since calcium blood level elevated earlier this year. Vitamin D level within normal limits. Intolerances: none Taking medications as directed: yes Side effects thought to be attributed to current medication regimen: no Last DEXA (07/31/19): T score = L femur neck T -2.0, R forearm radius T -2.6 Falls reported in last year: 0 Current exercise: not discussed today Continue alendronate (Fosamax) 70 mg by mouth once weekly Recommend walking, low impact aerobic exercise, or strength training  Rheumatoid  arthritis  - New goal:  Followed by Dr. Estanislado Pandy Controlled per patient Current medications: methotrexate 7.5 mg once weekly (every Thursday) + folic acid 1 mg by mouth daily and prednisone 4 mg by mouth daily Continue current management per rheumatology  Patient Goals/Self-Care Activities Patient will:  Focus on medication adherence by keeping up with prescription refills and either using a pill box or reminders to take your medications at the prescribed times Check blood pressure at least once daily, document, and provide at future appointments Engage in dietary modifications by decreased sodium intake and adequate fluid intake (at least 6 cups of fluid per day)  Follow Up Plan: Telephone follow up appointment with care management team member scheduled for: 02/26/21     Consent to CCM Services: [name] was given information about Chronic Care Management services including:  CCM service includes personalized support from designated clinical staff supervised by her physician, including individualized plan of care and coordination with other care providers 24/7 contact phone numbers for assistance for urgent and routine care needs. Service will only be billed when office clinical staff spend 20 minutes or more in a month to coordinate care. Only one practitioner may furnish and bill the service in a calendar month. The patient may stop CCM services at any time (effective at the end of the month) by phone call to the office staff. The patient will be responsible for cost sharing (co-pay) of up to 20% of the service fee (after annual deductible is met).  Patient agreed to services and verbal consent obtained.   Plan: Telephone follow up appointment with care management team member scheduled for:  02/26/21  Kennon Holter, PharmD, BCACP, CPP Clinical Pharmacist Practitioner [facility] [phone]  Please call the care guide team at [phone] if you need to cancel or  reschedule your appointment.   The patient verbalized understanding of instructions, educational materials, and care plan provided today and agreed to receive a mailed copy of patient instructions, educational materials, and care plan.

## 2020-12-25 NOTE — Chronic Care Management (AMB) (Signed)
Chronic Care Management Pharmacy Note  12/25/2020 Name:  Stacy Meyers MRN:  [id number] DOB:  Jul 19, 1932  Summary: Hypertension Blood pressure under poor control. Blood pressure is above goal of <140/90 mmHg per 2022 AAFP guidelines. Current medications: amlodipine 5 mg by mouth once daily and hydrochlorothiazide 12.5 mg by mouth once daily Encourage dietary sodium restriction/DASH diet Recommend home blood pressure monitoring to discuss at next visit Discussed need for medication compliance Continue current medications as above for now. If blood pressure continues to be elevated, may consider adding low dose ARB due to albuminuria  Subjective: Stacy Meyers is an 87 y.o. year old female who is a primary patient of Fayrene Helper, MD.  The CCM team was consulted for assistance with disease management and care coordination needs.    Engaged with patient by telephone for initial visit in response to provider referral for pharmacy case management and/or care coordination services.   Consent to Services:  The patient was given the following information about Chronic Care Management services today, agreed to services, and gave verbal consent: 1. CCM service includes personalized support from designated clinical staff supervised by the primary care provider, including individualized plan of care and coordination with other care providers 2. 24/7 contact phone numbers for assistance for urgent and routine care needs. 3. Service will only be billed when office clinical staff spend 20 minutes or more in a month to coordinate care. 4. Only one practitioner may furnish and bill the service in a calendar month. 5.The patient may stop CCM services at any time (effective at the end of the month) by phone call to the office staff. 6. The patient will be responsible for cost sharing (co-pay) of up to 20% of the service fee (after annual deductible is met). Patient agreed to services and consent  obtained.  Patient Care Team: Fayrene Helper, MD as PCP - General Branch, Alphonse Guild, MD as PCP - Cardiology (Cardiology) Estanislado Emms, MD (Inactive) as Consulting Physician (Nephrology) Bo Merino, MD as Consulting Physician (Rheumatology) Beryle Lathe, [facility] (Pharmacist)  Objective:  Lab Results  Component Value Date   CREATININE 0.92 10/24/2020   CREATININE 0.95 08/14/2020   CREATININE 0.85 05/30/2020    Lab Results  Component Value Date   HGBA1C 6.1 (H) 10/24/2020   Last diabetic Eye exam:  Lab Results  Component Value Date/Time   HMDIABEYEEXA No Retinopathy 11/22/2019 12:00 AM    Last diabetic Foot exam: No results found for: HMDIABFOOTEX      Component Value Date/Time   CHOL 157 10/24/2020 1043   TRIG 100 10/24/2020 1043   HDL 59 10/24/2020 1043   CHOLHDL 2.7 10/24/2020 1043   CHOLHDL 2.6 10/24/2019 1100   VLDL 26 02/24/2016 0832   LDLCALC 80 10/24/2020 1043   LDLCALC 69 10/24/2019 1100    Hepatic Function Latest Ref Rng & Units 10/24/2020 08/14/2020 05/30/2020  Total Protein 6.0 - 8.5 g/dL 6.9 6.8 6.7  Albumin 3.6 - 4.6 g/dL 4.5 - 4.4  AST 0 - 40 IU/L _0 ALT 0 - 32 IU/L _1 Alk Phosphatase 44 - 121 IU/L 64 - 70  Total Bilirubin 0.0 - 1.2 mg/dL 0.4 0.6 0.6  Bilirubin, Direct 0.0 - 0.3 mg/dL - - -    Lab Results  Component Value Date/Time   TSH 2.560 02/02/2020 08:55 AM   TSH 0.98 10/24/2019 11:00 AM   FREET4 1.29 02/02/2020 08:55 AM   FREET4 0.97  12/15/2010 04:59 AM    CBC Latest Ref Rng & Units 08/14/2020 03/12/2020 10/03/2019  WBC 3.8 - 10.8 Thousand/uL 6.4 7.2 7.0  Hemoglobin 11.7 - 15.5 g/dL 13.5 13.4 12.8  Hematocrit 35.0 - 45.0 % 41.5 40.1 38.1  Platelets 140 - 400 Thousand/uL 258 283 277    Lab Results  Component Value Date/Time   VD25OH 30.2 05/30/2020 10:07 AM   VD25OH 45 03/02/2019 08:05 AM    Clinical ASCVD: No  The ASCVD Risk score (Arnett DK, et al., 2019) failed to calculate for the following  reasons:   The 2019 ASCVD risk score is only valid for ages 31 to 42    Social History   Tobacco Use  Smoking Status Never  Smokeless Tobacco Never   BP Readings from Last 3 Encounters:  10/24/20 (!) 142/70  08/22/20 (!) 160/80  08/14/20 (!) 144/85   Pulse Readings from Last 3 Encounters:  10/24/20 66  08/22/20 (!) 55  08/14/20 90   Wt Readings from Last 3 Encounters:  10/24/20 136 lb 1.9 oz (61.7 kg)  08/22/20 139 lb (63 kg)  08/14/20 133 lb 6.4 oz (60.5 kg)    Assessment: Review of patient past medical history, allergies, medications, health status, including review of consultants reports, laboratory and other test data, was performed as part of comprehensive evaluation and provision of chronic care management services.   SDOH:  (Social Determinants of Health) assessments and interventions performed:    CCM Care Plan  Allergies  Allergen Reactions   Aspirin Other (See Comments)    REACTION: burns stomach (uncoated)   Sudafed [Pseudoephedrine Hcl] Other (See Comments)    Increased joint pain    Medications Reviewed Today     Reviewed by Beryle Lathe, [facility] (Pharmacist) on 12/25/20 at 1512  Med List Status: <None>   Medication Order Taking? Sig Documenting Provider Last Dose Status Informant  acetaminophen (TYLENOL) 500 MG tablet [id number] Yes Take 1,000 mg by mouth every 6 (six) hours as needed (for pain).  [provider] Taking Active Family Member  acyclovir (ZOVIRAX) 400 MG tablet [id number] Yes TAKE 1 TABLET BY MOUTH TWICE A DAY Fayrene Helper, MD Taking Active   alendronate (FOSAMAX) 70 MG tablet [id number] Yes TAKE 1 TABLET BY MOUTH EVERY 7 DAYS. TAKE WITH A FULL GLASS OF WATER ON AN EMPTY STOMACH. Cassandria Anger, MD Taking Active            Med Note Vernell Barrier Dec 25, 2020  3:07 PM) Takes every Thursday  amLODipine (NORVASC) 5 MG tablet [id number] Yes TAKE 1 TABLET BY MOUTH EVERY DAY Fayrene Helper, MD Taking  Active   butalbital-acetaminophen-caffeine [facility]) [phone] MG tablet [id number] Yes Take one tablet by mouth once daily, as needed, for severe headache Fayrene Helper, MD Taking Active   folic acid (FOLVITE) 1 MG tablet [id number] Yes Take 1 tablet (1 mg total) by mouth daily. Fayrene Helper, MD Taking Active   hydrochlorothiazide (MICROZIDE) 12.5 MG capsule [id number] Yes Take 12.5 mg by mouth daily. [provider] Taking Active   methotrexate (RHEUMATREX) 2.5 MG tablet [id number] Yes TAKE 3 TABLETS EACH WEEK ON THE SAME DAY. Fayrene Helper, MD Taking Active            Med Note Vernell Barrier Dec 25, 2020  3:09 PM) Dewaine Conger every thursday  montelukast (SINGULAIR) 10 MG tablet [id number] Yes TAKE 1 TABLET BY MOUTH EVERYDAY AT BEDTIME Simpson,  Norwood Levo, MD Taking Active   predniSONE (DELTASONE) 1 MG tablet [id number] Yes TAKE 4 TABLETS (4 MG TOTAL) BY MOUTH DAILY WITH BREAKFAST Ofilia Neas, PA-C Taking Active   Propylene Glycol (SYSTANE BALANCE OP) [id number] Yes Place 1 drop into both eyes daily as needed (for dry eyes).  [provider] Taking Active Family Member  rosuvastatin (CRESTOR) 5 MG tablet [id number] Yes TAKE 1 TABLET BY MOUTH EVERY OTHER DAY Fayrene Helper, MD Taking Active             Patient Active Problem List   Diagnosis Date Noted   Acute cystitis without hematuria 10/27/2020   Mass of left lower leg 10/24/2020   Headache 08/26/2020   Prediabetes 10/28/2019   Annual physical exam 10/24/2019   H/o Chronic heart failure with preserved ejection fraction (HFpEF) ([place]) 94/49/6759   Umbilical hernia without obstruction and without gangrene 12/10/2018   Fibroids 05/08/2016   Spondylosis of lumbar region without myelopathy or radiculopathy 12/24/2015   Solitary bone cyst of right shoulder 05/28/2015   Right knee pain 05/28/2015   Osteoarthritis of both shoulders 05/28/2015   Overweight (BMI 25.0-29.9) 04/28/2014   Ganglion  cyst of wrist 16/38/4665   Systolic murmur 99/35/7017   Diastolic dysfunction 79/39/0300   Seasonal allergies 08/28/2010   Herpes simplex virus (HSV) infection 09/16/2008   Rheumatoid arthritis with rheumatoid factor of multiple sites without organ or systems involvement ([place]) 09/16/2008   Hyperlipidemia LDL goal <100 05/23/2008   Essential hypertension, benign 05/23/2008    Immunization History  Administered Date(s) Administered   Fluad Quad(high Dose 65+) 10/19/2019   Influenza Split 11/07/2013   Influenza Whole 10/28/2009, 09/17/2010   Influenza, High Dose Seasonal PF 09/29/2016, 09/15/2018   Influenza,inj,Quad PF,6+ Mos 09/03/2014   Influenza-Unspecified 10/05/2013, 09/29/2016   Moderna Sars-Covid-2 Vaccination 01/17/2019, 02/27/2019, 10/30/2019   PPD Test 05/14/2014, 05/28/2015   Pneumococcal Conjugate-13 08/03/2013   Pneumococcal Polysaccharide-23 08/28/2009   Tdap 04/29/2010    Conditions to be addressed/monitored: HTN, HLD, and RA and pre-diabetes  Care Plan : Medication Management  Updates made by Beryle Lathe, [place] since 12/25/2020 12:00 AM     Problem: HTN, HLD, RA, Osteoporosis, Pre-DM   Priority: High  Onset Date: 12/25/2020     Long-Range Goal: Disease Progression Prevention   Start Date: 12/25/2020  Expected End Date: 03/25/2021  This Visit's Progress: On track  Priority: High  Note:   Current Barriers:  Unable to independently monitor therapeutic efficacy Unable to achieve control of hypertension  Pharmacist Clinical Goal(s):  Through collaboration with PharmD and provider, patient will  Achieve adherence to monitoring guidelines and medication adherence to achieve therapeutic efficacy Achieve control of hypertension as evidenced by improved blood pressure control   Interventions: 1:1 collaboration with Fayrene Helper, MD regarding development and update of comprehensive plan of care as evidenced by provider attestation and  co-signature Inter-disciplinary care team collaboration (see longitudinal plan of care) Comprehensive medication review performed; medication list updated in electronic medical record  Pre-diabetes - New goal.: Controlled; Most recent A1c  6.1 Current medications:  none Intolerances: none Taking medications as directed: n/a Side effects thought to be attributed to current medication regimen: n/a Current meal patterns: not discussed today On a statin: yes On aspirin 81 mg daily: no Last microalbumin/creatinine ratio: 167 (03/07/20); on an ACEi/ARB: no Current glucose readings:  not discussed today Continue to monitor blood glucose and A1c every 6-12 months  Hypertension - New goal.: Blood pressure under poor control. Blood  pressure is above goal of <140/90 mmHg per 2022 AAFP guidelines. Current medications: amlodipine 5 mg by mouth once daily and hydrochlorothiazide 12.5 mg by mouth once daily Intolerances: none Taking medications as directed: yes Side effects thought to be attributed to current medication regimen: no Current exercise: not discussed today Current home blood pressure: patient reports she does not check often Encourage dietary sodium restriction/DASH diet Recommend home blood pressure monitoring to discuss at next visit Discussed need for medication compliance Continue current medications as above for now. If blood pressure continues to be elevated, may consider adding low dose ARB due to albuminuria  Hyperlipidemia - New goal.: Controlled. LDL at goal of <100 due to moderate risk given <2 major CV risk factors (hypertension) and 10-year risk <10% per 2020 AACE/ACE guidelines. Triglycerides at goal of <150 per 2020 AACE/ACE guidelines. Current medications: rosuvastatin 5 mg by mouth every other day Intolerances: none Taking medications as directed: yes Side effects thought to be attributed to current medication regimen: no Continue rosuvastatin 5 mg by mouth every  other day Encourage dietary reduction of high fat containing foods such as butter, nuts, bacon, egg yolks, etc.  Osteoporosis - New goal.: Followed by Dr. Dorris Fetch (endocrinology) Appropriately managed Current medications: alendronate (Fosamax) 70 mg by mouth once weekly (every Thursday) Patient does not take calcium since calcium blood level elevated earlier this year. Vitamin D level within normal limits. Intolerances: none Taking medications as directed: yes Side effects thought to be attributed to current medication regimen: no Last DEXA (07/31/19): T score = L femur neck T -2.0, R forearm radius T -2.6 Falls reported in last year: 0 Current exercise: not discussed today Continue alendronate (Fosamax) 70 mg by mouth once weekly Recommend walking, low impact aerobic exercise, or strength training  Rheumatoid arthritis  - New goal: Followed by Dr. Estanislado Pandy Controlled per patient Current medications: methotrexate 7.5 mg once weekly (every Thursday) + folic acid 1 mg by mouth daily and prednisone 4 mg by mouth daily Continue current management per rheumatology  Patient Goals/Self-Care Activities Patient will:  Focus on medication adherence by keeping up with prescription refills and either using a pill box or reminders to take your medications at the prescribed times Check blood pressure at least once daily, document, and provide at future appointments Engage in dietary modifications by decreased sodium intake and adequate fluid intake (at least 6 cups of fluid per day)  Follow Up Plan: Telephone follow up appointment with care management team member scheduled for: 02/26/21     Medication Assistance: None required.  Patient affirms current coverage meets needs.  Patient's preferred pharmacy is:  [id number] [place] [id number]: [phone]: [phone] Uses pill box? Yes  Follow Up:   Patient agrees to Care Plan and Follow-up.  Plan: Telephone follow up appointment with care management team member scheduled for:  02/26/21  CKennon Holter PharmD, BQuemado CDavisClinical Pharmacist Practitioner [facility] [phone]

## 2020-12-26 ENCOUNTER — Other Ambulatory Visit: Payer: Self-pay | Admitting: Physician Assistant

## 2020-12-26 ENCOUNTER — Other Ambulatory Visit: Payer: Self-pay | Admitting: Family Medicine

## 2020-12-26 DIAGNOSIS — E785 Hyperlipidemia, unspecified: Secondary | ICD-10-CM

## 2020-12-26 NOTE — Telephone Encounter (Signed)
Next Visit: 01/15/2021   Last Visit: 08/14/2020   Last Fill: 09/23/2020   Dx: Rheumatoid arthritis with rheumatoid factor of multiple sites without organ or systems involvement    Current Dose per office note on 08/14/2020: prednisone 1 mg 4 tablets daily    Okay to refill Prednisone?

## 2021-01-01 NOTE — Progress Notes (Signed)
Office Visit Note  Patient: Stacy Meyers             Date of Birth: 03/25/1932           MRN: [id number]             PCP: Fayrene Helper, MD Referring: Fayrene Helper, MD Visit Date: 01/15/2021 Occupation: @GUAROCC @  Subjective:  Medication monitoring   History of Present Illness: DANNE SCARDINA is a 87 y.o. female with history of seropositive rheumatoid arthritis and osteoarthritis.  Patient is taking methotrexate 3 tablets by mouth once weekly, folic acid 1 mg daily, and prednisone 4 mg daily.  She has not missed any doses recently.  She denies any recent RA flares. She has occasional discomfort and soreness in the right knee but denies any joint swelling  She uses the cane to assist with ambulation.  She denies any recent falls.  She continues to take fosamax 70 mg 1 tablet by mouth once weekly.   She has had intermittent discomfort in her feet at night. She describes the pain as a burning sensation.  She has not been evaluated by her PCP yet.     Activities of Daily Living:  Patient reports morning stiffness for 5 minutes.   Patient Denies nocturnal pain.  Difficulty dressing/grooming: Denies Difficulty climbing stairs: Denies Difficulty getting out of chair: Reports Difficulty using hands for taps, buttons, cutlery, and/or writing: Reports  Review of Systems  Constitutional:  Positive for fatigue.  HENT:  Positive for mouth dryness and nose dryness. Negative for mouth sores.   Eyes:  Positive for dryness. Negative for pain, itching and visual disturbance.  Respiratory:  Negative for cough, hemoptysis, shortness of breath and difficulty breathing.   Cardiovascular:  Negative for chest pain, palpitations, hypertension and swelling in legs/feet.  Gastrointestinal:  Negative for blood in stool, constipation and diarrhea.  Endocrine: Negative for increased urination.  Genitourinary:  Positive for difficulty urinating. Negative for painful urination.  Musculoskeletal:   Positive for joint pain, joint pain, joint swelling, myalgias, morning stiffness, muscle tenderness and myalgias. Negative for muscle weakness.  Skin:  Negative for color change, pallor, rash, hair loss, nodules/bumps, redness, skin tightness, ulcers and sensitivity to sunlight.  Allergic/Immunologic: Negative for susceptible to infections.  Neurological:  Positive for memory loss. Negative for headaches and weakness.  Hematological:  Negative for swollen glands.  Psychiatric/Behavioral:  Negative for depressed mood, confusion and sleep disturbance. The patient is not nervous/anxious.    PMFS History:  Patient Active Problem List   Diagnosis Date Noted   Acute cystitis without hematuria 10/27/2020   Mass of left lower leg 10/24/2020   Headache 08/26/2020   Prediabetes 10/28/2019   Annual physical exam 10/24/2019   H/o Chronic heart failure with preserved ejection fraction (HFpEF) ([place]) 95/63/8756   Umbilical hernia without obstruction and without gangrene 12/10/2018   Fibroids 05/08/2016   Spondylosis of lumbar region without myelopathy or radiculopathy 12/24/2015   Solitary bone cyst of right shoulder 05/28/2015   Right knee pain 05/28/2015   Osteoarthritis of both shoulders 05/28/2015   Overweight (BMI 25.0-29.9) 04/28/2014   Ganglion cyst of wrist 43/32/9518   Systolic murmur 84/16/6063   Diastolic dysfunction 01/60/1093   Seasonal allergies 08/28/2010   Herpes simplex virus (HSV) infection 09/16/2008   Rheumatoid arthritis with rheumatoid factor of multiple sites without organ or systems involvement ([place]) 09/16/2008   Hyperlipidemia LDL goal <100 05/23/2008   Essential hypertension, benign 05/23/2008    Past Medical  History:  Diagnosis Date   Acid reflux    Acute cystitis with hematuria 04/11/2011   Anemia    ARF (acute renal failure) ([place]) 12/14/2010   Arthritis    CKD (chronic kidney disease), stage II    previously seen by nephrologist--- dr Florene Glen,  pt released per lov in  epic 09-24-2017   Diabetes mellitus, type II ([place])    Diverticulosis of colon    Essential hypertension, benign    followed by pcp   (pt had nuclear stress test (in epic under media) dated 05-25-2008 showed normal perfusion w/ no evidence ischemia, ef 83%   Heart disease    History of bradycardia    previously seen by cardiologist, dr branch, pt released per lov 08-11-2018 in epic, bradycardia resolved   History of sepsis    04-03-2019 hospital admission w/ urosepsis due to obstructive uropathy due to right ureter stone   Hypokalemia    Lipoma of colon    ascending   Lumbar spondylosis    Mixed hyperlipidemia    Nephrolithiasis    per CT 04-03-2019 nonobstructive bilateral stones   Ocular herpes    Osteoarthritis    followed by dr Estanislado Pandy--- hands, shoulders, feet   Osteoporosis    Rheumatoid arthritis(714.0)    rheumotologist-- dr s. Estanislado Pandy---  multiple sites, treated with methotrexate/ prednisone   Right nephrolithiasis-1.4cm Moderate right-sided urinary tract obstruction/hydronephrosis 04/03/2019   Right ureteral calculus    Seasonal allergies    Sepsis ([place]) 04/03/2019   Sepsis secondary to UTI /Nephrolithiasis with obstructive uropathy 04/03/2019   SUI (stress urinary incontinence, female)    Uterine fibroid     Family History  Problem Relation Age of Onset   Stroke Mother    Cancer Father    Diabetes Sister    Diabetes Sister    Kidney failure Brother    Kidney failure Brother    Pancreatic cancer Sister    Kidney Stones Sister        Only one Kidney   Arthritis Sister    Heart disease Daughter    Past Surgical History:  Procedure Laterality Date   BUNIONECTOMY Right 2012   CATARACT EXTRACTION W/ INTRAOCULAR LENS  IMPLANT, BILATERAL  2009   CHOLECYSTECTOMY  12/17/2010   Procedure: LAPAROSCOPIC CHOLECYSTECTOMY;  Surgeon: Donato Heinz;  Location: [facility];  Service: General;  Laterality: N/A;   COLONOSCOPY  05/22/2011   Procedure: COLONOSCOPY;  Surgeon:  Rogene Houston, MD;  Location: [facility];  Service: Endoscopy;  Laterality: N/A;  [place], URETEROSCOPY AND STENT PLACEMENT Right 05/09/2019   Procedure: CYSTOSCOPY WITH RETROGRADE PYELOGRAM, URETEROSCOPY AND STENT PLACEMENT;  Surgeon: Robley Fries, MD;  Location: [facility];  Service: Urology;  Laterality: Right;  1 HR   CYSTOSCOPY WITH STENT PLACEMENT Right 04/03/2019   Procedure: CYSTOSCOPY, RIGHT RETROGRADE PYELOGRAM  WITH STENT PLACEMENT;  Surgeon: Robley Fries, MD;  Location: [facility];  Service: Urology;  Laterality: Right;   EXCISION BONE CYST Right 2010   RIGHT SHOULDER   GANGLION CYST EXCISION Left 06/02/2012   Procedure: Excision ganglion left dorsal wrist;  Surgeon: Sanjuana Kava, MD;  Location: [facility];  Service: Orthopedics;  Laterality: Left;   HOLMIUM LASER APPLICATION Right 01/10/1094   Procedure: HOLMIUM LASER APPLICATION;  Surgeon: Robley Fries, MD;  Location: [facility];  Service: Urology;  Laterality: Right;   kidney stones     LUMBAR MICRODISCECTOMY  07-16-2000   @  [facility]   L 2  -3   POSTERIOR LAMINECTOMY / DECOMPRESSION LUMBAR SPINE  06-26-2009   @[facility]    w/ LATERAL MASS FUSION , L3 -- 5   SHOULDER ARTHROSCOPY WITH DISTAL CLAVICLE RESECTION Right 01-10-2009   @[facility]    w/ DEBRIDEMENT AND EXCISION RECURRENT GANGLION CYST   Social History   Social History Narrative   Husband passed away 2016/04/21. It has been financially harder to pay the bills since his passing, but she is making it. Has grandchildren around which makes it easier.    Immunization History  Administered Date(s) Administered   Fluad Quad(high Dose 65+) 10/19/2019   Influenza Split 11/07/2013   Influenza Whole 10/28/2009, 09/17/2010   Influenza, High Dose Seasonal PF 09/29/2016, 09/15/2018   Influenza,inj,Quad PF,6+ Mos 09/03/2014   Influenza-Unspecified 10/05/2013, 09/29/2016   Moderna Sars-Covid-2 Vaccination 01/17/2019,  02/27/2019, 10/30/2019   PPD Test 05/14/2014, 05/28/2015   Pneumococcal Conjugate-13 08/03/2013   Pneumococcal Polysaccharide-23 08/28/2009   Tdap 04/29/2010     Objective: Vital Signs: BP 109/63 (BP Location: Left Arm, Patient Position: Sitting, Cuff Size: Normal)    Pulse (!) 57    Ht 4\' 11"  (1.499 m)    Wt 132 lb 3.2 oz (60 kg)    BMI 26.70 kg/m    Physical Exam Vitals and nursing note reviewed.  Constitutional:      Appearance: She is well-developed.  HENT:     Head: Normocephalic and atraumatic.  Eyes:     Conjunctiva/sclera: Conjunctivae normal.  Pulmonary:     Effort: Pulmonary effort is normal.  Abdominal:     Palpations: Abdomen is soft.  Musculoskeletal:     Cervical back: Normal range of motion.  Skin:    General: Skin is warm and dry.     Capillary Refill: Capillary refill takes less than 2 seconds.  Neurological:     Mental Status: She is alert and oriented to person, place, and time.  Psychiatric:        Behavior: Behavior normal.     Musculoskeletal Exam: C-spine has slightly limited range of motion with lateral rotation.  Both shoulders have slightly limited range of motion with abduction.  She has good internal rotation of both shoulders.  Elbow joints have good range of motion with no tenderness or inflammation.  Thickening of MCP, PIP, and DIP joints.  Some tenderness over the radial aspect of the left wrist.  Knee joints have good range of motion with synovial thickening especially on the right knee.  No warmth or effusion of knee joints noted.  Ankle joints have good range of motion with no tenderness or joint swelling.  No tenderness over MTP joints.  2nd toe hammertoe deformity bilaterally.   CDAI Exam: CDAI Score: -- Patient Global: --; Provider Global: -- Swollen: --; Tender: -- Joint Exam 01/15/2021   No joint exam has been documented for this visit   There is currently no information documented on the homunculus. Go to the Rheumatology activity  and complete the homunculus joint exam.  Investigation: No additional findings.  Imaging: DG Abd 1 View  Result Date: 01/03/2021 CLINICAL DATA:  History of renal stones. EXAM: ABDOMEN - 1 VIEW COMPARISON:  CT without contrast 04/03/2019. FINDINGS: 1.4 cm proximal right ureteral stone previously seen at the level of L4-5 is no longer identified. A 5 mm nonobstructive caliceal stone superimposes in the upper pole of the right kidney. There are no other visible intrarenal stones bilaterally. The bowel pattern is nonobstructive with moderate stool  retention. No new pathologic calcification is seen in the pelvis, with calcified uterine fibroids redemonstrated. Visceral shadows are stable. There is osteopenia, dextroscoliosis and advanced degenerative change of the lumbar spine, mild to moderate hip DJD. Cholecystectomy clips. IMPRESSION: There is a 5 mm nonobstructive caliceal stone in the superior pole right kidney. No other intrarenal stone is seen. There was previously a 1.4 cm L4-5 level proximal right ureteral stone which is no longer identified. There are calcified uterine fibroids with no visible new pathologic pelvic calcification. Constipation. Electronically Signed   By: Telford Nab M.D.   On: 01/03/2021 23:08   US RENAL  Result Date: 12/23/2020 CLINICAL DATA:  Renal calculi EXAM: RENAL / URINARY TRACT ULTRASOUND COMPLETE COMPARISON:  05/18/2020 FINDINGS: Right Kidney: Renal measurements: 9.0 x 3.9 by 3.5 cm = volume: 64.0 mL. Stable increased renal cortical echotexture and renal cortical thinning. No change in the minimally complex 1.5 cm cyst upper pole with dependent calcification. Nonobstructing 7 mm calculus is again identified. No hydronephrosis. Left Kidney: Renal measurements: 11.2 x 4.8 x 4.0 cm = volume: 114.2 mL. Stable increased renal cortical echotexture and renal cortical thinning. Stable exophytic 3.1 cm simple cyst. Stable nonobstructing 4 mm calculus. No hydronephrosis.  Bladder: Appears normal for degree of bladder distention. Other: None. IMPRESSION: 1. Stable bilateral nonobstructing renal calculi. 2. Stable bilateral renal cysts. 3. Stable increased renal cortical echotexture and cortical thinning, consistent with medical renal disease. Electronically Signed   By: Randa Ngo M.D.   On: 12/23/2020 12:58    Recent Labs: Lab Results  Component Value Date   WBC 6.4 08/14/2020   HGB 13.5 08/14/2020   PLT 258 08/14/2020   NA 142 10/24/2020   K 4.1 10/24/2020   CL 103 10/24/2020   CO2 25 10/24/2020   GLUCOSE 91 10/24/2020   BUN 21 10/24/2020   CREATININE 0.92 10/24/2020   BILITOT 0.4 10/24/2020   ALKPHOS 64 10/24/2020   AST 15 10/24/2020   ALT 8 10/24/2020   PROT 6.9 10/24/2020   ALBUMIN 4.5 10/24/2020   CALCIUM 10.0 10/24/2020   GFRAA 70 03/12/2020    Speciality Comments: No specialty comments available.  Procedures:  No procedures performed Allergies: Aspirin and Sudafed [pseudoephedrine hcl]    Assessment / Plan:     Visit Diagnoses: Rheumatoid arthritis with rheumatoid factor of multiple sites without organ or systems involvement ([place]) - longstanding history of rheumatoid arthritis with pain and discomfort in multiple joints: She has no joint tenderness or synovitis on examination today.  She has not had any signs or symptoms of a rheumatoid arthritis flare since her last office visit.  She has clinically been doing well on methotrexate 3 tablets once weekly and prednisone 4 mg daily.  She is tolerating both medications without any side effects and has not missed any doses recently.  She has been experiencing some increased discomfort in her feet intermittently only at night.  She describes the sensation as a burning sensation in her toes which raises the concern for possible neuropathy.  She plans on following up with her PCP to further discuss the symptoms she has been experiencing.  On examination she did not have any tenderness or  inflammation over the MTP joints or PIP joints.  Discussed the importance of continuing to wear proper fitting shoes.  She was advised to notify us if she develops increased joint pain or joint swelling.  She will follow-up in the office in 5 months.  High risk medication use - Methotrexate  3 tablets by mouth every 7 days, folic acid 1 mg daily, and prednisone 1 mg 4 tablets daily.  CBC drawn on 08/14/2020 and CMP updated on 10/24/2020.  She is overdue to update lab work.  Orders for CBC and CMP were released.  Her next lab work will be due in April and every 3 months to monitor for drug toxicity. - Plan: CBC with Differential/Platelet, COMPLETE METABOLIC PANEL WITH GFR She has not had any recent infections.  Discussed the importance of holding methotrexate if she develop signs or symptoms of an infection and to resume once the infection has completely cleared.    Primary osteoarthritis of both hands - X-rays are consistent with severe osteoarthritis and rheumatoid arthritis overlap.  She has PIP and DIP thickening consistent with osteoarthritis of both hands.  Discussed the importance of joint protection and muscle strengthening.  Primary osteoarthritis of both shoulders: She has limited forward flexion and abduction above shoulder joints but had good internal rotation on examination today.  She has not been experiencing any increased discomfort in her shoulder joints recently.  Primary osteoarthritis of both knees: She has good range of motion of both knee joints on examination today.  No warmth or effusion was noted.  Synovial thickening of both knee joints especially the right knee noted.  She has occasional discomfort in her right knee but has not noticed any inflammation.  She continues to use a cane to assist with ambulation.  She has not had any recent falls.  Primary osteoarthritis of both feet - X-rays are consistent with severe rheumatoid arthritis and osteoarthritis overlap.  She is been  experiencing some increased pain in both feet at night.  Her symptoms have been intermittent and she describes the sensation as a burning sensation in her toes.  On examination today she did not have any tenderness over MTP joints.  Hammertoe deformity of both second toes noted.  Some loss of the fat pad under MTPs noted.  Ankle joints have good range of motion with no tenderness or inflammation.  Discussed the importance of wearing proper fitting shoes.  She plans on further discussing her symptoms with her primary care at her upcoming office visit due to the concern for possible neuropathy.  Spondylosis of lumbar region without myelopathy or radiculopathy: She is not experiencing any increased discomfort in her back at this time.  No symptoms of radiculopathy.  Age-related osteoporosis without current pathological fracture - DEXA ordered on 07/31/19 by Dr. Moshe Cipro: Left femur Neck:  Osteopenia T-score -2.0, BMD 0.764 g/cm2.  She continues to take Fosamax 70 mg 1 tablet by mouth once weekly.  She has not had any recent falls or fractures.  She uses a cane to assist with ambulation.  She will be due to update her bone density in July 2023.  Other medical conditions are listed as follows:   Acute cystitis without hematuria  Essential hypertension: BP was 109/63 today in the office.   Systolic murmur  Type 2 diabetes, diet controlled ([place])  Orders: Orders Placed This Encounter  Procedures   CBC with Differential/Platelet   COMPLETE METABOLIC PANEL WITH GFR   No orders of the defined types were placed in this encounter.    Follow-Up Instructions: Return in about 5 months (around 06/15/2021) for Rheumatoid arthritis, Osteoarthritis.   Ofilia Neas, PA-C  Note - This record has been created using Dragon software.  Chart creation errors have been sought, but may not always  have been located. Such creation  errors do not reflect on  the standard of medical care.

## 2021-01-02 ENCOUNTER — Ambulatory Visit: Payer: Medicare HMO | Admitting: Urology

## 2021-01-03 ENCOUNTER — Ambulatory Visit (HOSPITAL_COMMUNITY)
Admission: RE | Admit: 2021-01-03 | Discharge: 2021-01-03 | Disposition: A | Discharge status: Home / Self Care | Payer: Medicare HMO | Source: Ambulatory Visit | Attending: Urology | Admitting: Urology

## 2021-01-03 ENCOUNTER — Other Ambulatory Visit: Payer: Self-pay

## 2021-01-03 DIAGNOSIS — N200 Calculus of kidney: Secondary | ICD-10-CM | POA: Diagnosis not present

## 2021-01-03 DIAGNOSIS — K5900 Constipation, unspecified: Secondary | ICD-10-CM | POA: Diagnosis not present

## 2021-01-04 DIAGNOSIS — M0579 Rheumatoid arthritis with rheumatoid factor of multiple sites without organ or systems involvement: Secondary | ICD-10-CM | POA: Diagnosis not present

## 2021-01-04 DIAGNOSIS — E785 Hyperlipidemia, unspecified: Secondary | ICD-10-CM

## 2021-01-04 DIAGNOSIS — M818 Other osteoporosis without current pathological fracture: Secondary | ICD-10-CM | POA: Diagnosis not present

## 2021-01-04 DIAGNOSIS — I10 Essential (primary) hypertension: Secondary | ICD-10-CM | POA: Diagnosis not present

## 2021-01-09 ENCOUNTER — Encounter: Payer: Self-pay | Admitting: Urology

## 2021-01-09 ENCOUNTER — Other Ambulatory Visit: Payer: Self-pay

## 2021-01-09 ENCOUNTER — Ambulatory Visit: Payer: Medicare HMO | Admitting: Urology

## 2021-01-09 VITALS — BP 161/70 | HR 68 | Wt 136.0 lb

## 2021-01-09 DIAGNOSIS — N281 Cyst of kidney, acquired: Secondary | ICD-10-CM

## 2021-01-09 DIAGNOSIS — Z87440 Personal history of urinary (tract) infections: Secondary | ICD-10-CM

## 2021-01-09 DIAGNOSIS — N200 Calculus of kidney: Secondary | ICD-10-CM

## 2021-01-09 DIAGNOSIS — N3946 Mixed incontinence: Secondary | ICD-10-CM

## 2021-01-09 LAB — URINALYSIS, ROUTINE W REFLEX MICROSCOPIC
Bilirubin, UA: NEGATIVE
Glucose, UA: NEGATIVE
Ketones, UA: NEGATIVE
Leukocytes,UA: NEGATIVE
Nitrite, UA: NEGATIVE
Protein,UA: NEGATIVE
Specific Gravity, UA: 1.01 (ref 1.005–1.030)
Urobilinogen, Ur: 0.2 mg/dL (ref 0.2–1.0)
pH, UA: 6.5 (ref 5.0–7.5)

## 2021-01-09 LAB — MICROSCOPIC EXAMINATION: Renal Epithel, UA: NONE SEEN /hpf

## 2021-01-09 NOTE — Progress Notes (Signed)
Urological Symptom Review  Patient is experiencing the following symptoms: Get up at night to urinate Leakage of urine Stream starts and stops   Review of Systems  Gastrointestinal (upper)  : Indigestion/heartburn  Gastrointestinal (lower) : Diarrhea Negative for lower GI symptoms  Constitutional : Negative for symptoms  Skin: Negative for skin symptoms  Eyes: Blurred vision  Ear/Nose/Throat : Sinus problems  Hematologic/Lymphatic: Easy bruising  Cardiovascular : Negative for cardiovascular symptoms  Respiratory : Negative for respiratory symptoms  Endocrine: Negative for endocrine symptoms  Musculoskeletal: Back pain Joint pain  Neurological: Headaches Dizziness  Psychologic: Negative for psychiatric symptoms

## 2021-01-09 NOTE — Progress Notes (Signed)
Subjective: 1. Bilateral renal cysts   2. Renal stones   3. Mixed incontinence   4. Personal history of urinary infection     01/09/21: Stacy Meyers returns today in f/u.  She had a culture on 10/24/20 that was negative. She has done well without recent UTI's and she has had no flank pain or hematuria.  She has nocturia x 4-5.  She denies daytime incontinence.   Her UA is unremarkable.  KUB and renal US prior to this visit shows stable renal stones and cysts without obstruction.   07/04/20: Stacy Meyers returns today in f/u.   Her culture on 05/02/20 was negative.   She had a renal US that showed bilateral renal stones without obstruction on 05/18/20:  She had some right back pain that was bruised.  That lasted 2-3 days but has resolved.  She has no dysuria or hematuria.  She has nocturia up to 3x.   She continues to wear a pad.   Her UA has 6-10 WBC and few bacteria.    05/02/20: Stacy Meyers is an 88 yo female who is sent by Dr. Moshe Cipro for recurrent e. Coli UTI's with multiple positive cultures since 2/21 with the last on 03/14/20.   She had e. Coli sepsis in 3/21.   She had a 1.4cm right proximal stone with obstruction in 3/21 she was stented and then had ureteroscopy on 05/09/19.  She had a tethered stent and remembers pulling it out but she never followed up with Dr. Claudia Desanctis after the procedure.  She has had no follow up imaging.  She currently has no dysuria but she has nocturia and frequency with urgency with UUI.  She has had no hematuria. She has occasional intermittent right low back pain.  Her urine was clear on 05/01/20.  ROS:  ROS  Allergies  Allergen Reactions   Aspirin Other (See Comments)    REACTION: burns stomach (uncoated)   Sudafed [Pseudoephedrine Hcl] Other (See Comments)    Increased joint pain    Past Medical History:  Diagnosis Date   Acid reflux    Acute cystitis with hematuria 04/11/2011   Anemia    ARF (acute renal failure) ([place]) 12/14/2010   Arthritis    CKD (chronic kidney disease),  stage II    previously seen by nephrologist--- dr Florene Glen,  pt released per lov in epic 09-24-2017   Diabetes mellitus, type II ([place])    Diverticulosis of colon    Essential hypertension, benign    followed by pcp   (pt had nuclear stress test (in epic under media) dated 05-25-2008 showed normal perfusion w/ no evidence ischemia, ef 83%   Heart disease    History of bradycardia    previously seen by cardiologist, dr branch, pt released per lov 08-11-2018 in epic, bradycardia resolved   History of sepsis    04-03-2019 hospital admission w/ urosepsis due to obstructive uropathy due to right ureter stone   Hypokalemia    Lipoma of colon    ascending   Lumbar spondylosis    Mixed hyperlipidemia    Nephrolithiasis    per CT 04-03-2019 nonobstructive bilateral stones   Ocular herpes    Osteoarthritis    followed by dr Estanislado Pandy--- hands, shoulders, feet   Osteoporosis    Rheumatoid arthritis(714.0)    rheumotologist-- dr s. Estanislado Pandy---  multiple sites, treated with methotrexate/ prednisone   Right nephrolithiasis-1.4cm Moderate right-sided urinary tract obstruction/hydronephrosis 04/03/2019   Right ureteral calculus    Seasonal allergies    Sepsis (  [place]) 04/03/2019   Sepsis secondary to UTI /Nephrolithiasis with obstructive uropathy 04/03/2019   SUI (stress urinary incontinence, female)    Uterine fibroid     Past Surgical History:  Procedure Laterality Date   BUNIONECTOMY Right 2012   CATARACT EXTRACTION W/ INTRAOCULAR LENS  IMPLANT, BILATERAL  2009   CHOLECYSTECTOMY  12/17/2010   Procedure: LAPAROSCOPIC CHOLECYSTECTOMY;  Surgeon: Donato Heinz;  Location: [facility];  Service: General;  Laterality: N/A;   COLONOSCOPY  05/22/2011   Procedure: COLONOSCOPY;  Surgeon: Rogene Houston, MD;  Location: [facility];  Service: Endoscopy;  Laterality: N/A;  [place], URETEROSCOPY AND STENT PLACEMENT Right 05/09/2019   Procedure: CYSTOSCOPY WITH RETROGRADE  PYELOGRAM, URETEROSCOPY AND STENT PLACEMENT;  Surgeon: Robley Fries, MD;  Location: [facility];  Service: Urology;  Laterality: Right;  1 HR   CYSTOSCOPY WITH STENT PLACEMENT Right 04/03/2019   Procedure: CYSTOSCOPY, RIGHT RETROGRADE PYELOGRAM  WITH STENT PLACEMENT;  Surgeon: Robley Fries, MD;  Location: [facility];  Service: Urology;  Laterality: Right;   EXCISION BONE CYST Right 2010   RIGHT SHOULDER   GANGLION CYST EXCISION Left 06/02/2012   Procedure: Excision ganglion left dorsal wrist;  Surgeon: Sanjuana Kava, MD;  Location: [facility];  Service: Orthopedics;  Laterality: Left;   HOLMIUM LASER APPLICATION Right 03/11/6438   Procedure: HOLMIUM LASER APPLICATION;  Surgeon: Robley Fries, MD;  [facility];  Service: Urology;  Laterality: Right;   kidney stones     LUMBAR MICRODISCECTOMY  07-16-2000   @[facility]    L 2  -3   POSTERIOR LAMINECTOMY / DECOMPRESSION LUMBAR SPINE  06-26-2009   @[facility]    w/ LATERAL MASS FUSION , L3 -- 5   SHOULDER ARTHROSCOPY WITH DISTAL CLAVICLE RESECTION Right 01-10-2009   @[facility]    w/ DEBRIDEMENT AND EXCISION RECURRENT GANGLION CYST    Social History   Socioeconomic History   Marital status: Widowed    Spouse name: Not on file   Number of children: 1   Years of education: Not on file   Highest education level: Not on file  Occupational History   Occupation: Retired    Comment: [employer]  Tobacco Use   Smoking status: Never   Smokeless tobacco: Never  Vaping Use   Vaping Use: Never used  Substance and Sexual Activity   Alcohol use: No   Drug use: Never   Sexual activity: Not Currently    Birth control/protection: Post-menopausal  Other Topics Concern   Not on file  Social History Narrative   Husband passed away 2016/05/06. It has been financially harder to pay the bills since his passing, but she is making it. Has grandchildren around which makes it easier.    Social Determinants of Health   Financial  Resource Strain: Low Risk    Difficulty of Paying Living Expenses: Not hard at all  Food Insecurity: No Food Insecurity   Worried About Charity fundraiser in the Last Year: Never true   [place] in the Last Year: Never true  Transportation Needs: No Transportation Needs   Lack of Transportation (Medical): No   Lack of Transportation (Non-Medical): No  Physical Activity: Insufficiently Active   Days of Exercise per Week: 3 days   Minutes of Exercise per Session: 20 min  Stress: No Stress Concern Present   Feeling of Stress : Not at all  Social Connections: Moderately Isolated   Frequency of  Communication with Friends and Family: More than three times a week   Frequency of Social Gatherings with Friends and Family: More than three times a week   Attends Religious Services: More than 4 times per year   Active Member of [employer] or Organizations: No   Attends Archivist Meetings: Never   Marital Status: Widowed  Human resources officer Violence: Not At Risk   Fear of Current or Ex-Partner: No   Emotionally Abused: No   Physically Abused: No   Sexually Abused: No    Family History  Problem Relation Age of Onset   Stroke Mother    Cancer Father    Diabetes Sister    Diabetes Sister    Kidney failure Brother    Kidney failure Brother    Pancreatic cancer Sister    Kidney Stones Sister        Only one Kidney   Arthritis Sister    Heart disease Daughter     Anti-infectives: Anti-infectives (From admission, onward)    None       Current Outpatient Medications  Medication Sig Dispense Refill   acetaminophen (TYLENOL) 500 MG tablet Take 1,000 mg by mouth every 6 (six) hours as needed (for pain).      acyclovir (ZOVIRAX) 400 MG tablet TAKE 1 TABLET BY MOUTH TWICE A DAY 180 tablet 1   alendronate (FOSAMAX) 70 MG tablet TAKE 1 TABLET BY MOUTH EVERY 7 DAYS. TAKE WITH A FULL GLASS OF WATER ON AN EMPTY STOMACH. 12 tablet 12   amLODipine (NORVASC) 5 MG tablet TAKE 1 TABLET  BY MOUTH EVERY DAY 90 tablet 0   butalbital-acetaminophen-caffeine (FIORICET) 50-325-40 MG tablet Take one tablet by mouth once daily, as needed, for severe headache 14 tablet 0   folic acid (FOLVITE) 1 MG tablet Take 1 tablet (1 mg total) by mouth daily. 90 tablet 3   hydrochlorothiazide (MICROZIDE) 12.5 MG capsule Take 12.5 mg by mouth daily.     methotrexate (RHEUMATREX) 2.5 MG tablet TAKE 3 TABLETS EACH WEEK ON THE SAME DAY. 36 tablet 0   montelukast (SINGULAIR) 10 MG tablet TAKE 1 TABLET BY MOUTH EVERYDAY AT BEDTIME 90 tablet 1   predniSONE (DELTASONE) 1 MG tablet TAKE 4 TABLETS (4 MG TOTAL) BY MOUTH DAILY WITH BREAKFAST 360 tablet 0   Propylene Glycol (SYSTANE BALANCE OP) Place 1 drop into both eyes daily as needed (for dry eyes).      rosuvastatin (CRESTOR) 5 MG tablet TAKE 1 TABLET BY MOUTH EVERY OTHER DAY 45 tablet 0   No current facility-administered medications for this visit.     Objective: Vital signs in last 24 hours: BP (!) 161/70    Pulse 68    Wt 136 lb (61.7 kg)    BMI 27.47 kg/m   Intake/Output from previous day: No intake/output data recorded. Intake/Output this shift: @IOTHISSHIFT @   Physical Exam  Lab Results:  Results for orders placed or performed in visit on 01/09/21 (from the past 24 hour(s))  Urinalysis, Routine w reflex microscopic     Status: Abnormal   Collection Time: 01/09/21  3:10 PM  Result Value Ref Range   Specific Gravity, UA 1.010 1.005 - 1.030   pH, UA 6.5 5.0 - 7.5   Color, UA Yellow Yellow   Appearance Ur Clear Clear   Leukocytes,UA Negative Negative   Protein,UA Negative Negative/Trace   Glucose, UA Negative Negative   Ketones, UA Negative Negative   RBC, UA Trace (A) Negative   Bilirubin, UA  Negative Negative   Urobilinogen, Ur 0.2 0.2 - 1.0 mg/dL   Nitrite, UA Negative Negative   Microscopic Examination See below:    Narrative   Performed at:  [facility] [address], [place], [place]  [id number] Lab  Director: Mina Marble MT, Phone:  [phone]  Microscopic Examination     Status: Abnormal   Collection Time: 01/09/21  3:10 PM   Urine  Result Value Ref Range   WBC, UA 0-5 0 - 5 /hpf   RBC 0-2 0 - 2 /hpf   Epithelial Cells (non renal) 0-10 0 - 10 /hpf   Renal Epithel, UA None seen None seen /hpf   Bacteria, UA Few (A) None seen/Few   Narrative   Performed at:  [facility] [address] [place], [place]  [id number] Lab Director: Erma, Phone:  [phone]      BMET No results for input(s): NA, K, CL, CO2, GLUCOSE, BUN, CREATININE, CALCIUM in the last 72 hours. PT/INR No results for input(s): LABPROT, INR in the last 72 hours. ABG No results for input(s): PHART, HCO3 in the last 72 hours.  Invalid input(s): PCO2, PO2  Studies/Results:   EXAM: RENAL / URINARY TRACT ULTRASOUND COMPLETE   COMPARISON:  05/18/2020   FINDINGS: Right Kidney:   Renal measurements: 9.0 x 3.9 by 3.5 cm = volume: 64.0 mL. Stable increased renal cortical echotexture and renal cortical thinning. No change in the minimally complex 1.5 cm cyst upper pole with dependent calcification. Nonobstructing 7 mm calculus is again identified. No hydronephrosis.   Left Kidney:   Renal measurements: 11.2 x 4.8 x 4.0 cm = volume: 114.2 mL. Stable increased renal cortical echotexture and renal cortical thinning. Stable exophytic 3.1 cm simple cyst. Stable nonobstructing 4 mm calculus. No hydronephrosis.   Bladder:   Appears normal for degree of bladder distention.   Other:   None.   IMPRESSION: 1. Stable bilateral nonobstructing renal calculi. 2. Stable bilateral renal cysts. 3. Stable increased renal cortical echotexture and cortical thinning, consistent with medical renal disease.     Electronically Signed   By: Randa Ngo M.D.   On: 12/23/2020 12:58 Narrative & Impression  CLINICAL DATA:  History of renal stones.   EXAM: ABDOMEN - 1 VIEW   COMPARISON:  CT  without contrast 04/03/2019.   FINDINGS: 1.4 cm proximal right ureteral stone previously seen at the level of L4-5 is no longer identified.   A 5 mm nonobstructive caliceal stone superimposes in the upper pole of the right kidney. There are no other visible intrarenal stones bilaterally. The bowel pattern is nonobstructive with moderate stool retention. No new pathologic calcification is seen in the pelvis, with calcified uterine fibroids redemonstrated.   Visceral shadows are stable. There is osteopenia, dextroscoliosis and advanced degenerative change of the lumbar spine, mild to moderate hip DJD. Cholecystectomy clips.   IMPRESSION: There is a 5 mm nonobstructive caliceal stone in the superior pole right kidney. No other intrarenal stone is seen. There was previously a 1.4 cm L4-5 level proximal right ureteral stone which is no longer identified. There are calcified uterine fibroids with no visible new pathologic pelvic calcification. Constipation.     Electronically Signed   By: Telford Nab M.D.   On: 01/03/2021 23:08    Assessment/Plan: Recurrent UTI with prior sepsis.   Her UA today is unremarkable and her last culture was negative.   Stable renal stones and cysts with bilateral renal atrophy on  [place] and KUB.    Mixed incontinence with nocturia.  Stable and not in need of therapy.    No orders of the defined types were placed in this encounter.     Orders Placed This Encounter  Procedures   Microscopic Examination   US RENAL    Standing Status:   Future    Standing Expiration Date:   01/09/2022    Order Specific Question:   Reason for Exam (SYMPTOM  OR DIAGNOSIS REQUIRED)    Answer:   renal stones    Order Specific Question:   Preferred imaging location?    Answer:   [facility]   DG Abd 1 View    Standing Status:   Future    Standing Expiration Date:   01/09/2022    Order Specific Question:   Reason for Exam (SYMPTOM  OR DIAGNOSIS REQUIRED)    Answer:    renal stones    Order Specific Question:   Preferred imaging location?    Answer:   [facility]    Order Specific Question:   Radiology Contrast Protocol - do NOT remove file path    Answer:   [email or web address]   Urinalysis, Routine w reflex microscopic     Return in about 1 year (around 01/09/2022) for with KUB and renal US. .    CC: Dr. [name].     Irine Seal 01/09/2021 [id number] ID: Stacy Meyers, female   DOB: 05-13-1932, 88 y.o.   MRN: [id number]

## 2021-01-14 ENCOUNTER — Other Ambulatory Visit: Payer: Self-pay | Admitting: Family Medicine

## 2021-01-14 DIAGNOSIS — M0579 Rheumatoid arthritis with rheumatoid factor of multiple sites without organ or systems involvement: Secondary | ICD-10-CM

## 2021-01-15 ENCOUNTER — Encounter: Payer: Self-pay | Admitting: Physician Assistant

## 2021-01-15 ENCOUNTER — Other Ambulatory Visit: Payer: Self-pay

## 2021-01-15 ENCOUNTER — Ambulatory Visit (INDEPENDENT_AMBULATORY_CARE_PROVIDER_SITE_OTHER): Payer: Medicare PPO | Admitting: Physician Assistant

## 2021-01-15 VITALS — BP 109/63 | HR 57 | Ht 59.0 in | Wt 132.2 lb

## 2021-01-15 DIAGNOSIS — M19041 Primary osteoarthritis, right hand: Secondary | ICD-10-CM | POA: Diagnosis not present

## 2021-01-15 DIAGNOSIS — R011 Cardiac murmur, unspecified: Secondary | ICD-10-CM

## 2021-01-15 DIAGNOSIS — M0579 Rheumatoid arthritis with rheumatoid factor of multiple sites without organ or systems involvement: Secondary | ICD-10-CM

## 2021-01-15 DIAGNOSIS — Z79899 Other long term (current) drug therapy: Secondary | ICD-10-CM

## 2021-01-15 DIAGNOSIS — M810 Age-related osteoporosis without current pathological fracture: Secondary | ICD-10-CM

## 2021-01-15 DIAGNOSIS — M170 Bilateral primary osteoarthritis of knee: Secondary | ICD-10-CM

## 2021-01-15 DIAGNOSIS — E119 Type 2 diabetes mellitus without complications: Secondary | ICD-10-CM

## 2021-01-15 DIAGNOSIS — M19072 Primary osteoarthritis, left ankle and foot: Secondary | ICD-10-CM

## 2021-01-15 DIAGNOSIS — M19042 Primary osteoarthritis, left hand: Secondary | ICD-10-CM

## 2021-01-15 DIAGNOSIS — I10 Essential (primary) hypertension: Secondary | ICD-10-CM

## 2021-01-15 DIAGNOSIS — M19071 Primary osteoarthritis, right ankle and foot: Secondary | ICD-10-CM

## 2021-01-15 DIAGNOSIS — M19011 Primary osteoarthritis, right shoulder: Secondary | ICD-10-CM

## 2021-01-15 DIAGNOSIS — M19012 Primary osteoarthritis, left shoulder: Secondary | ICD-10-CM

## 2021-01-15 DIAGNOSIS — M47816 Spondylosis without myelopathy or radiculopathy, lumbar region: Secondary | ICD-10-CM

## 2021-01-15 LAB — COMPLETE METABOLIC PANEL WITH GFR
AG Ratio: 2 (calc) (ref 1.0–2.5)
ALT: 9 U/L (ref 6–29)
AST: 15 U/L (ref 10–35)
Albumin: 4.3 g/dL (ref 3.6–5.1)
Alkaline phosphatase (APISO): 52 U/L (ref 37–153)
BUN/Creatinine Ratio: 23 (calc) — ABNORMAL HIGH (ref 6–22)
BUN: 24 mg/dL (ref 7–25)
CO2: 33 mmol/L — ABNORMAL HIGH (ref 20–32)
Calcium: 10.5 mg/dL — ABNORMAL HIGH (ref 8.6–10.4)
Chloride: 106 mmol/L (ref 98–110)
Creat: 1.05 mg/dL — ABNORMAL HIGH (ref 0.60–0.95)
Globulin: 2.2 g/dL (calc) (ref 1.9–3.7)
Glucose, Bld: 95 mg/dL (ref 65–99)
Potassium: 3.5 mmol/L (ref 3.5–5.3)
Sodium: 144 mmol/L (ref 135–146)
Total Bilirubin: 0.5 mg/dL (ref 0.2–1.2)
Total Protein: 6.5 g/dL (ref 6.1–8.1)
eGFR: 52 mL/min/{1.73_m2} — ABNORMAL LOW (ref >=60)

## 2021-01-15 LAB — CBC WITH DIFFERENTIAL/PLATELET
Absolute Monocytes: 581 cells/uL (ref 200–950)
Basophils Absolute: 53 cells/uL (ref 0–200)
Basophils Relative: 0.8 %
Eosinophils Absolute: 92 cells/uL (ref 15–500)
Eosinophils Relative: 1.4 %
HCT: 40.3 % (ref 35.0–45.0)
Hemoglobin: 13.5 g/dL (ref 11.7–15.5)
Lymphs Abs: 2468 cells/uL (ref 850–3900)
MCH: 31.5 pg (ref 27.0–33.0)
MCHC: 33.5 g/dL (ref 32.0–36.0)
MCV: 94.2 fL (ref 80.0–100.0)
MPV: 9.2 fL (ref 7.5–12.5)
Monocytes Relative: 8.8 %
Neutro Abs: 3406 cells/uL (ref 1500–7800)
Neutrophils Relative %: 51.6 %
Platelets: 249 10*3/uL (ref 140–400)
RBC: 4.28 10*6/uL (ref 3.80–5.10)
RDW: 13.1 % (ref 11.0–15.0)
Total Lymphocyte: 37.4 %
WBC: 6.6 10*3/uL (ref 3.8–10.8)

## 2021-01-15 NOTE — Patient Instructions (Addendum)
Please stop your methotrexate if you develop signs or symptoms of an infection and resume methotrexate once the infection has completely cleared.     Standing Labs We placed an order today for your standing lab work.   Please have your standing labs drawn in April and every 3 months   If possible, please have your labs drawn 2 weeks prior to your appointment so that the provider can discuss your results at your appointment.  Please note that you may see your imaging and lab results in [facility] before we have reviewed them. We may be awaiting multiple results to interpret others before contacting you. Please allow our office up to 72 hours to thoroughly review all of the results before contacting the office for clarification of your results.  We have open lab daily: Monday through Thursday from 1:30-4:30 PM and Friday from 1:30-4:00 PM at the office of Dr. Bo Merino, [facility].   Please be advised, all patients with office appointments requiring lab work will take precedent over walk-in lab work.  If possible, please come for your lab work on Monday and Friday afternoons, as you may experience shorter wait times. The office is located at [address] [place], [place], [place] [address] No appointment is necessary.   Labs are drawn by [facility]. Please bring your co-pay at the time of your lab draw.  You may receive a bill from [facility] for your lab work.  If you wish to have your labs drawn at another location, please call the office 24 hours in advance to send orders.  If you have any questions regarding directions or hours of operation,  please call [phone].   As a reminder, please drink plenty of water prior to coming for your lab work. Thanks!

## 2021-01-16 ENCOUNTER — Other Ambulatory Visit: Payer: Self-pay | Admitting: *Deleted

## 2021-01-16 DIAGNOSIS — Z79899 Other long term (current) drug therapy: Secondary | ICD-10-CM

## 2021-01-16 DIAGNOSIS — M0579 Rheumatoid arthritis with rheumatoid factor of multiple sites without organ or systems involvement: Secondary | ICD-10-CM

## 2021-01-16 MED ORDER — METHOTREXATE 2.5 MG PO TABS: ORAL_TABLET | ORAL | 0 refills | Status: DC

## 2021-01-16 NOTE — Progress Notes (Signed)
CBC WNL.  Creatinine is borderline elevated-1.05.  GFR is slightly low-52.  She is taking low dose on methotrexate and HCTZ.  Please advise the patient to reduce MTX to 2 tablets weekly. Recheck BMP with GFR in 1 month.  Calcium is borderline elevated-10.5.    Please forward labs to PCP as requested.

## 2021-01-18 ENCOUNTER — Other Ambulatory Visit: Payer: Self-pay | Admitting: Family Medicine

## 2021-01-27 ENCOUNTER — Other Ambulatory Visit: Payer: Self-pay | Admitting: Family Medicine

## 2021-01-27 ENCOUNTER — Other Ambulatory Visit: Payer: Self-pay | Admitting: Rheumatology

## 2021-01-27 DIAGNOSIS — E669 Obesity, unspecified: Secondary | ICD-10-CM

## 2021-01-27 DIAGNOSIS — I10 Essential (primary) hypertension: Secondary | ICD-10-CM

## 2021-01-27 DIAGNOSIS — E559 Vitamin D deficiency, unspecified: Secondary | ICD-10-CM

## 2021-01-27 DIAGNOSIS — M0579 Rheumatoid arthritis with rheumatoid factor of multiple sites without organ or systems involvement: Secondary | ICD-10-CM

## 2021-01-27 DIAGNOSIS — E785 Hyperlipidemia, unspecified: Secondary | ICD-10-CM

## 2021-01-27 DIAGNOSIS — R7989 Other specified abnormal findings of blood chemistry: Secondary | ICD-10-CM

## 2021-01-27 DIAGNOSIS — E1169 Type 2 diabetes mellitus with other specified complication: Secondary | ICD-10-CM

## 2021-01-31 ENCOUNTER — Telehealth: Payer: Self-pay | Admitting: "Endocrinology

## 2021-01-31 NOTE — Telephone Encounter (Signed)
Can you update labs for pt for [facility]

## 2021-01-31 NOTE — Telephone Encounter (Signed)
I spoke to Dr. Dorris Fetch and this patient has had some of the labs done and he wants to wait until her next visit to do more labs and stated no labs for her upcoming visit.

## 2021-01-31 NOTE — Telephone Encounter (Signed)
Ok, I will let pt know

## 2021-02-07 ENCOUNTER — Ambulatory Visit: Payer: Medicare PPO | Admitting: "Endocrinology

## 2021-02-07 ENCOUNTER — Other Ambulatory Visit: Payer: Self-pay

## 2021-02-07 ENCOUNTER — Encounter: Payer: Self-pay | Admitting: "Endocrinology

## 2021-02-07 VITALS — Ht 59.0 in | Wt 134.0 lb

## 2021-02-07 DIAGNOSIS — M810 Age-related osteoporosis without current pathological fracture: Secondary | ICD-10-CM | POA: Diagnosis not present

## 2021-02-07 NOTE — Progress Notes (Signed)
02/07/2021         Endocrinology follow-up note   Past Medical History:  Diagnosis Date   Acid reflux    Acute cystitis with hematuria 04/11/2011   Anemia    ARF (acute renal failure) ([place]) 12/14/2010   Arthritis    CKD (chronic kidney disease), stage II    previously seen by nephrologist--- dr Florene Glen,  pt released per lov in epic 09-24-2017   Diabetes mellitus, type II ([place])    Diverticulosis of colon    Essential hypertension, benign    followed by pcp   (pt had nuclear stress test (in epic under media) dated 05-25-2008 showed normal perfusion w/ no evidence ischemia, ef 83%   Heart disease    History of bradycardia    previously seen by cardiologist, dr branch, pt released per lov 08-11-2018 in epic, bradycardia resolved   History of sepsis    04-03-2019 hospital admission w/ urosepsis due to obstructive uropathy due to right ureter stone   Hypokalemia    Lipoma of colon    ascending   Lumbar spondylosis    Mixed hyperlipidemia    Nephrolithiasis    per CT 04-03-2019 nonobstructive bilateral stones   Ocular herpes    Osteoarthritis    followed by dr Estanislado Pandy--- hands, shoulders, feet   Osteoporosis    Rheumatoid arthritis(714.0)    rheumotologist-- dr s. Estanislado Pandy---  multiple sites, treated with methotrexate/ prednisone   Right nephrolithiasis-1.4cm Moderate right-sided urinary tract obstruction/hydronephrosis 04/03/2019   Right ureteral calculus    Seasonal allergies    Sepsis ([place]) 04/03/2019   Sepsis secondary to UTI /Nephrolithiasis with obstructive uropathy 04/03/2019   SUI (stress urinary incontinence, female)    Uterine fibroid    Past Surgical History:  Procedure Laterality Date   BUNIONECTOMY Right 2012   CATARACT EXTRACTION W/ INTRAOCULAR LENS  IMPLANT, BILATERAL  2009   CHOLECYSTECTOMY  12/17/2010   Procedure: LAPAROSCOPIC CHOLECYSTECTOMY;  Surgeon: Donato Heinz;  Location: [facility];  Service:  General;  Laterality: N/A;   COLONOSCOPY  05/22/2011   Procedure: COLONOSCOPY;  Surgeon: Rogene Houston, MD;  Location: [facility];  Service: Endoscopy;  Laterality: N/A;  [place], URETEROSCOPY AND STENT PLACEMENT Right 05/09/2019   Procedure: CYSTOSCOPY WITH RETROGRADE PYELOGRAM, URETEROSCOPY AND STENT PLACEMENT;  Surgeon: Robley Fries, MD;  Location: [facility];  Service: Urology;  Laterality: Right;  1 HR   CYSTOSCOPY WITH STENT PLACEMENT Right 04/03/2019   Procedure: CYSTOSCOPY, RIGHT RETROGRADE PYELOGRAM  WITH STENT PLACEMENT;  Surgeon: Robley Fries, MD;  Location: [facility];  Service: Urology;  Laterality: Right;   EXCISION BONE CYST Right 2010   RIGHT SHOULDER   GANGLION CYST EXCISION Left 06/02/2012   Procedure: Excision ganglion left dorsal wrist;  Surgeon: Sanjuana Kava, MD;  Location: [facility];  Service: Orthopedics;  Laterality: Left;   HOLMIUM LASER APPLICATION Right 07/10/6431   Procedure: HOLMIUM LASER APPLICATION;  Surgeon: Robley Fries, MD;  Location: [facility];  Service: Urology;  Laterality: Right;   kidney stones     LUMBAR MICRODISCECTOMY  07-16-2000   @[facility]    L 2  -3   POSTERIOR LAMINECTOMY / DECOMPRESSION LUMBAR SPINE  06-26-2009   @[facility]    w/ LATERAL MASS FUSION , L3 -- 5   SHOULDER ARTHROSCOPY WITH DISTAL CLAVICLE RESECTION Right 01-10-2009   @[facility]    w/ DEBRIDEMENT AND EXCISION RECURRENT GANGLION CYST   Social History   Socioeconomic History  Marital status: Widowed    Spouse name: Not on file   Number of children: 1   Years of education: Not on file   Highest education level: Not on file  Occupational History   Occupation: Retired    Comment: [employer]  Tobacco Use   Smoking status: Never   Smokeless tobacco: Never  Vaping Use   Vaping Use: Never used  Substance and Sexual Activity   Alcohol use: No   Drug use: Never   Sexual activity: Not Currently    Birth control/protection:  Post-menopausal  Other Topics Concern   Not on file  Social History Narrative   Husband passed away April 14, 2016. It has been financially harder to pay the bills since his passing, but she is making it. Has grandchildren around which makes it easier.    Social Determinants of Health   Financial Resource Strain: Low Risk    Difficulty of Paying Living Expenses: Not hard at all  Food Insecurity: No Food Insecurity   Worried About Charity fundraiser in the Last Year: Never true   [place] in the Last Year: Never true  Transportation Needs: No Transportation Needs   Lack of Transportation (Medical): No   Lack of Transportation (Non-Medical): No  Physical Activity: Insufficiently Active   Days of Exercise per Week: 3 days   Minutes of Exercise per Session: 20 min  Stress: No Stress Concern Present   Feeling of Stress : Not at all  Social Connections: Moderately Isolated   Frequency of Communication with Friends and Family: More than three times a week   Frequency of Social Gatherings with Friends and Family: More than three times a week   Attends Religious Services: More than 4 times per year   Active Member of [employer] or Organizations: No   Attends Archivist Meetings: Never   Marital Status: Widowed   Outpatient Encounter Medications as of 02/07/2021  Medication Sig   acetaminophen (TYLENOL) 500 MG tablet Take 1,000 mg by mouth every 6 (six) hours as needed (for pain).    acyclovir (ZOVIRAX) 400 MG tablet TAKE 1 TABLET BY MOUTH TWICE A DAY   alendronate (FOSAMAX) 70 MG tablet TAKE 1 TABLET BY MOUTH EVERY 7 DAYS. TAKE WITH A FULL GLASS OF WATER ON AN EMPTY STOMACH.   amLODipine (NORVASC) 5 MG tablet TAKE 1 TABLET BY MOUTH EVERY DAY   butalbital-acetaminophen-caffeine (FIORICET) 50-325-40 MG tablet Take one tablet by mouth once daily, as needed, for severe headache   folic acid (FOLVITE) 1 MG tablet TAKE 1 TABLET BY MOUTH EVERY DAY   hydrochlorothiazide (MICROZIDE) 12.5  MG capsule TAKE 1 CAPSULE BY MOUTH EVERY DAY   methotrexate (RHEUMATREX) 2.5 MG tablet Take 2 tablets by mouth on the same day once weekly.   montelukast (SINGULAIR) 10 MG tablet TAKE 1 TABLET BY MOUTH EVERYDAY AT BEDTIME   predniSONE (DELTASONE) 1 MG tablet TAKE 4 TABLETS (4 MG TOTAL) BY MOUTH DAILY WITH BREAKFAST   Propylene Glycol (SYSTANE BALANCE OP) Place 1 drop into both eyes daily as needed (for dry eyes).    rosuvastatin (CRESTOR) 5 MG tablet TAKE 1 TABLET BY MOUTH EVERY OTHER DAY   No facility-administered encounter medications on file as of 02/07/2021.   ALLERGIES: Allergies  Allergen Reactions   Aspirin Other (See Comments)    REACTION: burns stomach (uncoated)   Sudafed [Pseudoephedrine Hcl] Other (See Comments)    Increased joint pain    VACCINATION STATUS: Immunization History  Administered Date(s) Administered   Fluad Quad(high Dose 65+) 10/19/2019   Influenza Split 11/07/2013   Influenza Whole 10/28/2009, 09/17/2010   Influenza, High Dose Seasonal PF 09/29/2016, 09/15/2018   Influenza,inj,Quad PF,6+ Mos 09/03/2014   Influenza-Unspecified 10/05/2013, 09/29/2016   Moderna Sars-Covid-2 Vaccination 01/17/2019, 02/27/2019, 10/30/2019   PPD Test 05/14/2014, 05/28/2015   Pneumococcal Conjugate-13 08/03/2013   Pneumococcal Polysaccharide-23 08/28/2009   Tdap 04/29/2010     HPI   Stacy Meyers is 88 y.o. female who presents today for follow-up.  She was seen in consultation for osteoporosis during her last visit. PMD:  Fayrene Helper, MD.  Patient was diagnosed with osteoporosis at approximate age of 64 years.   She denies fractures or falls. No dizziness/vertigo/orthostasis. -She came alone today-see notes from prior visit.    She was treated with Fosamax for at least 5 years.  Review of her last 3 bone density studies show that there has been progressive improvement in her bone density.  She continues to tolerate Fosamax 70 mg p.o. weekly.  She has no new  complaints today.  Her labs show mild hypercalcemia associated with high PTH. She denies any prior history of nephrolithiasis nor parathyroid dysfunction. -Review of her medical records indicate a course of hypercalcemia between January and September 2020 which has resolved before her last visit.    No weight bearing exercises.   No history of thyrotoxicosis, she has no new complaints today. She has normal renal function.  Menopause was at 88 y/o.   -She did not report family history of osteoporosis.  She has well-controlled type 2 diabetes.  Because of her history of rheumatoid arthritis, she is on ongoing methotrexate treatment, and intermittent steroids.  -She reports losing 1 1/2 inches of height over time.  Review of Systems  Limited as above.  Objective:    Ht 4\' 11"  (1.499 m)    Wt 134 lb (60.8 kg)    BMI 27.06 kg/m   Wt Readings from Last 3 Encounters:  02/07/21 134 lb (60.8 kg)  01/15/21 132 lb 3.2 oz (60 kg)  01/09/21 136 lb (61.7 kg)    Physical Exam  Constitutional: + normal  weight for height, not in acute distress, normal state of mind, + walks with a cane.   CMP ( most recent) CMP     Component Value Date/Time   NA 144 01/15/2021 0935   NA 142 10/24/2020 1043   K 3.5 01/15/2021 0935   CL 106 01/15/2021 0935   CO2 33 (H) 01/15/2021 0935   GLUCOSE 95 01/15/2021 0935   BUN 24 01/15/2021 0935   BUN 21 10/24/2020 1043   CREATININE 1.05 (H) 01/15/2021 0935   CALCIUM 10.5 (H) 01/15/2021 0935   PROT 6.5 01/15/2021 0935   PROT 6.9 10/24/2020 1043   ALBUMIN 4.5 10/24/2020 1043   AST 15 01/15/2021 0935   ALT 9 01/15/2021 0935   ALKPHOS 64 10/24/2020 1043   BILITOT 0.5 01/15/2021 0935   BILITOT 0.4 10/24/2020 1043   GFRNONAA 60 03/12/2020 1102   GFRAA 70 03/12/2020 1102     Diabetic Labs (most recent): Lab Results  Component Value Date   HGBA1C 6.1 (H) 10/24/2020   HGBA1C 6.3 (H) 05/30/2020   HGBA1C 6.0 (A) 10/24/2019     Lipid Panel ( most  recent) Lipid Panel     Component Value Date/Time   CHOL 157 10/24/2020 1043   TRIG 100 10/24/2020 1043   HDL 59 10/24/2020 1043   CHOLHDL 2.7 10/24/2020  1043   CHOLHDL 2.6 10/24/2019 1100   VLDL 26 02/24/2016 0832   LDLCALC 80 10/24/2020 1043   LDLCALC 69 10/24/2019 1100   LABVLDL 18 10/24/2020 1043      Lab Results  Component Value Date   TSH 2.560 02/02/2020   TSH 0.98 10/24/2019   TSH 1.95 01/13/2018   TSH 2.354 01/24/2015   TSH 1.782 07/31/2013   TSH 0.915 04/16/2011   TSH 0.490 12/15/2010   TSH 1.426 03/03/2010   FREET4 1.29 02/02/2020   FREET4 0.97 12/15/2010          Assessment: 1. Osteoporosis  Plan: 1. Osteoporosis - likely postmenopausal    Review of her last 3 bone density studies showed that she is benefiting from treatment with alendronate a T score improving from -3.1 to  -2.6 on the right forearm radius.   We reviewed her DEXA scans together, and I explained that based on the T scores, she has an increased risk for fractures.  Her next bone density is in July 2023. -She continues to tolerate her current treatment with Fosamax 70 mg p.o. weekly.  She is advised to continue Fosamax 70 mg p.o. weekly, side effects and precautions discussed with her and her grandson in the room.   She is advised to not take any supplemental calcium.  She may benefit from continued vitamin D supplement. She will have repeat PTH/calcium next visit.  She will also have repeat bone density in July 2023. -I discussed with her all fall precautions and avoiding vigorous movements to reduce her risk of fracture.  She is encouraged to continue to use her cane to ambulate.  - we discussed about maintaining a good amount of protein in her diet. -She will have labs including PTH/calcium, CMP, thyroid function test before her next visit in 6 months.  - I advised patient to maintain close follow up with Fayrene Helper, MD for primary care needs.   I spent 30 minutes in the care  of the patient today including review of labs from Thyroid Function, CMP, and other relevant labs ; imaging/biopsy records (current and previous including abstractions from other facilities); face-to-face time discussing  her lab results and symptoms, medications doses, her options of short and long term treatment based on the latest standards of care / guidelines;   and documenting the encounter.  [name]  participated in the discussions, expressed understanding, and voiced agreement with the above plans.  All questions were answered to her satisfaction. she is encouraged to contact clinic should she have any questions or concerns prior to her return visit.  Follow up plan: Return in about 6 months (around 08/07/2021) for F/U with Pre-visit Labs, DXA Scan B4 NV.   Glade Lloyd, MD [facility] [address] [place], [place] [address] Phone: [phone]  Fax: [phone]     02/07/2021, 12:50 PM  This note was partially dictated with voice recognition software. Similar sounding words can be transcribed inadequately or may not  be corrected upon review.

## 2021-02-26 ENCOUNTER — Telehealth: Payer: Self-pay | Admitting: Pharmacist

## 2021-02-26 ENCOUNTER — Telehealth: Payer: Medicare HMO

## 2021-02-26 NOTE — Telephone Encounter (Signed)
°  Care Management   Follow Up Note  02/26/2021 Name: Stacy Meyers MRN: [id number] DOB: 1932-05-11  Referred by: Fayrene Helper, MD Reason for referral : Chronic Care Management (Unsuccessful Telephone Outreach)  An unsuccessful telephone outreach was attempted today. The patient was referred to the case management team for assistance with care management and care coordination.   Follow Up Plan: The care management team will reach out to the patient again over the next 7 days.   Kennon Holter, PharmD, [place], CPP Clinical Pharmacist Practitioner [facility] : [facility] [phone]

## 2021-03-13 ENCOUNTER — Ambulatory Visit (INDEPENDENT_AMBULATORY_CARE_PROVIDER_SITE_OTHER): Payer: Medicare PPO | Admitting: Family Medicine

## 2021-03-13 ENCOUNTER — Encounter (INDEPENDENT_AMBULATORY_CARE_PROVIDER_SITE_OTHER): Payer: Self-pay

## 2021-03-13 ENCOUNTER — Other Ambulatory Visit: Payer: Self-pay

## 2021-03-13 ENCOUNTER — Encounter: Payer: Self-pay | Admitting: Family Medicine

## 2021-03-13 VITALS — BP 120/80 | HR 61 | Resp 16 | Ht 59.0 in | Wt 134.1 lb

## 2021-03-13 DIAGNOSIS — N3000 Acute cystitis without hematuria: Secondary | ICD-10-CM

## 2021-03-13 DIAGNOSIS — R7303 Prediabetes: Secondary | ICD-10-CM

## 2021-03-13 DIAGNOSIS — G441 Vascular headache, not elsewhere classified: Secondary | ICD-10-CM

## 2021-03-13 DIAGNOSIS — I10 Essential (primary) hypertension: Secondary | ICD-10-CM

## 2021-03-13 DIAGNOSIS — M0579 Rheumatoid arthritis with rheumatoid factor of multiple sites without organ or systems involvement: Secondary | ICD-10-CM

## 2021-03-13 DIAGNOSIS — E559 Vitamin D deficiency, unspecified: Secondary | ICD-10-CM

## 2021-03-13 DIAGNOSIS — E785 Hyperlipidemia, unspecified: Secondary | ICD-10-CM

## 2021-03-13 DIAGNOSIS — E663 Overweight: Secondary | ICD-10-CM

## 2021-03-13 MED ORDER — PREDNISONE 5 MG PO TABS: 5.0000 mg | ORAL_TABLET | Freq: Two times a day (BID) | ORAL | 0 refills | Status: AC

## 2021-03-13 NOTE — Assessment & Plan Note (Signed)
Urinary frequency and h/o urosepsis, check CCUA  ?

## 2021-03-13 NOTE — Patient Instructions (Addendum)
Annual exam October 21 or after, call if you need me sooner. Flu vaccine at visit ? ?CCUA in office ? ?Labs today, hBA1C, lipid, cmp and EGFR, TSH and vit D ? ?No changes in medication ? ?Prednisone prescribe d for 3 days due to acute pain in right wrist, NO MORE IRONING!! ?Do NOT take prednisone 4 mg on the 3 days that you  take the 5 mg tablets ? ?[birth year] Birth[birth year], and wishingyou many more!! ?Careful not to fall ? ?Thanks for choosing [facility], we consider it a privelige to serve you. ? ?

## 2021-03-13 NOTE — Assessment & Plan Note (Signed)
Controlled, no change in medication ?DASH diet and commitment to daily physical activity for a minimum of 30 minutes discussed and encouraged, as a part of hypertension management. ?The importance of attaining a healthy weight is also discussed. ? ?BP/Weight 03/13/2021 02/07/2021 01/15/2021 01/09/2021 10/24/2020 08/22/2020 08/14/2020  ?Systolic BP 732 - 202 542 142 160 144  ?Diastolic BP 80 - 63 70 70 80 85  ?Wt. (Lbs) 134.08 134 132.2 136 136.12 139 133.4  ?BMI 27.08 27.06 26.7 27.47 27.49 27.15 26.49  ? ? ? ? ?

## 2021-03-14 ENCOUNTER — Encounter: Payer: Self-pay | Admitting: Family Medicine

## 2021-03-14 LAB — POCT URINALYSIS DIP (CLINITEK)
Bilirubin, UA: NEGATIVE
Blood, UA: NEGATIVE
Glucose, UA: NEGATIVE mg/dL
Ketones, POC UA: NEGATIVE mg/dL
Leukocytes, UA: NEGATIVE
Nitrite, UA: NEGATIVE
POC PROTEIN,UA: NEGATIVE
Spec Grav, UA: 1.02 (ref 1.010–1.025)
Urobilinogen, UA: 0.2 E.U./dL
pH, UA: 6.5 (ref 5.0–8.0)

## 2021-03-14 LAB — CMP14+EGFR
ALT: 8 IU/L (ref 0–32)
AST: 16 IU/L (ref 0–40)
Albumin/Globulin Ratio: 2 (ref 1.2–2.2)
Albumin: 4.3 g/dL (ref 3.6–4.6)
Alkaline Phosphatase: 67 IU/L (ref 44–121)
BUN/Creatinine Ratio: 24 (ref 12–28)
BUN: 23 mg/dL (ref 8–27)
Bilirubin Total: 0.7 mg/dL (ref 0.0–1.2)
CO2: 26 mmol/L (ref 20–29)
Calcium: 10.5 mg/dL — ABNORMAL HIGH (ref 8.7–10.3)
Chloride: 100 mmol/L (ref 96–106)
Creatinine, Ser: 0.94 mg/dL (ref 0.57–1.00)
Globulin, Total: 2.2 g/dL (ref 1.5–4.5)
Glucose: 93 mg/dL (ref 70–99)
Potassium: 3.4 mmol/L — ABNORMAL LOW (ref 3.5–5.2)
Sodium: 139 mmol/L (ref 134–144)
Total Protein: 6.5 g/dL (ref 6.0–8.5)
eGFR: 59 mL/min/{1.73_m2} — ABNORMAL LOW (ref >59)

## 2021-03-14 LAB — VITAMIN D 25 HYDROXY (VIT D DEFICIENCY, FRACTURES): Vit D, 25-Hydroxy: 35 ng/mL (ref 30.0–100.0)

## 2021-03-14 LAB — HEMOGLOBIN A1C
Est. average glucose Bld gHb Est-mCnc: 131 mg/dL
Hgb A1c MFr Bld: 6.2 % — ABNORMAL HIGH (ref 4.8–5.6)

## 2021-03-14 LAB — LIPID PANEL
Chol/HDL Ratio: 2.5 ratio (ref 0.0–4.4)
Cholesterol, Total: 149 mg/dL (ref 100–199)
HDL: 60 mg/dL (ref >39)
LDL Chol Calc (NIH): 68 mg/dL (ref 0–99)
Triglycerides: 120 mg/dL (ref 0–149)
VLDL Cholesterol Cal: 21 mg/dL (ref 5–40)

## 2021-03-14 LAB — TSH: TSH: 1.39 u[IU]/mL (ref 0.450–4.500)

## 2021-03-14 NOTE — Assessment & Plan Note (Signed)
Controlled, no change in medication  

## 2021-03-14 NOTE — Assessment & Plan Note (Signed)
Hyperlipidemia:Low fat diet discussed and encouraged. ? ? ?Lipid Panel  ?Lab Results  ?Component Value Date  ? CHOL 149 03/13/2021  ? HDL 60 03/13/2021  ? [place] 68 03/13/2021  ? TRIG 120 03/13/2021  ? CHOLHDL 2.5 03/13/2021  ? ? ? ?Controlled, no change in medication ? ?

## 2021-03-14 NOTE — Assessment & Plan Note (Signed)
Patient educated about the importance of limiting  Carbohydrate intake , the need to commit to daily physical activity for a minimum of 30 minutes , and to commit weight loss. ?The fact that changes in all these areas will reduce or eliminate all together the development of diabetes is stressed.  ? ?Diabetic Labs Latest Ref Rng & Units 03/13/2021 01/15/2021 10/24/2020 08/14/2020 05/30/2020  ?HbA1c 4.8 - 5.6 % 6.2(H) - 6.1(H) - 6.3(H)  ?Microalbumin mg/dL - - - - -  ?Micro/Creat Ratio 0 - 29 mg/g creat - - - - -  ?Chol 100 - 199 mg/dL 149 - 157 - 146  ?HDL >39 mg/dL 60 - 59 - 61  ?Calc LDL 0 - 99 mg/dL 68 - 80 - 69  ?Triglycerides 0 - 149 mg/dL 120 - 100 - 83  ?Creatinine 0.57 - 1.00 mg/dL 0.94 1.05(H) 0.92 0.95 0.85  ? ?BP/Weight 03/13/2021 02/07/2021 01/15/2021 01/09/2021 10/24/2020 08/22/2020 08/14/2020  ?Systolic BP 098 - 119 147 142 160 144  ?Diastolic BP 80 - 63 70 70 80 85  ?Wt. (Lbs) 134.08 134 132.2 136 136.12 139 133.4  ?BMI 27.08 27.06 26.7 27.47 27.49 27.15 26.49  ? ?Foot/eye exam completion dates Latest Ref Rng & Units 11/22/2019 05/08/2019  ?Eye Exam No Retinopathy No Retinopathy -  ?Foot Form Completion - - Done  ? ? ? ?

## 2021-03-14 NOTE — Assessment & Plan Note (Signed)
Acute flare of right wrist pain, short course of prednisone prescribed ?

## 2021-03-14 NOTE — Progress Notes (Signed)
? ?Stacy Meyers     MRN: [id number]      DOB: 12-24-1932 ? ? ?HPI ?[name] is here for follow up and re-evaluation of chronic medical conditions, medication management and review of any available recent lab and radiology data.  ?Preventive health is updated, specifically  Cancer screening and Immunization.   ?Questions or concerns regarding consultations or procedures which the PT has had in the interim are  addressed. ?The PT denies any adverse reactions to current medications since the last visit.  ? C/o urinary frequency and mild dysuria x 3 days, no flank pain, fevr , chills or malaise ?1 day h/o pain, warmth and swellin of right wrist, aggravated by excessive ironing the night before ? ?ROS ?Denies recent fever or chills. ?Denies sinus pressure, nasal congestion, ear pain or sore throat. ?Denies chest congestion, productive cough or wheezing. ?Denies chest pains, palpitations and leg swelling ?Denies abdominal pain, nausea, vomiting,diarrhea or constipation.   ?Denies headaches, seizures, numbness, or tingling. ?Denies depression, anxiety or insomnia. ?Denies skin break down or rash. ? ? ?PE ? ?BP 120/80   Pulse 61   Resp 16   Ht '4\' 11"'$  (1.499 m)   Wt 134 lb 1.3 oz (60.8 kg)   SpO2 95%   BMI 27.08 kg/m?  ? ?Patient alert and oriented and in no cardiopulmonary distress. ? ?HEENT: No facial asymmetry, EOMI,     Neck supple . ? ?Chest: Clear to auscultation bilaterally. ? ?CVS: S1, S2 no murmurs, no S3.Regular rate. ? ?ABD: Soft non tender.  ? ?Ext: No edema ? ?MS: decreased  ROM spine, shoulders, hips and knees.Marked deformit of wrists and fingers ? ?Skin: Intact, no ulcerations or rash noted. ? ?Psych: Good eye contact, normal affect. Memory intact not anxious or depressed appearing. ? ?CNS: CN 2-12 intact, power,  normal throughout.no focal deficits noted. ? ? ?Assessment & Plan ? ?Essential hypertension, benign ?Controlled, no change in medication ?DASH diet and commitment to daily physical activity  for a minimum of 30 minutes discussed and encouraged, as a part of hypertension management. ?The importance of attaining a healthy weight is also discussed. ? ?BP/Weight 03/13/2021 02/07/2021 01/15/2021 01/09/2021 10/24/2020 08/22/2020 08/14/2020  ?Systolic BP 440 - 347 425 142 160 144  ?Diastolic BP 80 - 63 70 70 80 85  ?Wt. (Lbs) 134.08 134 132.2 136 136.12 139 133.4  ?BMI 27.08 27.06 26.7 27.47 27.49 27.15 26.49  ? ? ? ? ? ?Acute cystitis without hematuria ?Urinary frequency and h/o urosepsis, check CCUA  ? ?Rheumatoid arthritis with rheumatoid factor of multiple sites without organ or systems involvement ([place]) ?Acute flare of right wrist pain, short course of prednisone prescribed ? ?Hyperlipidemia LDL goal <100 ?Hyperlipidemia:Low fat diet discussed and encouraged. ? ? ?Lipid Panel  ?Lab Results  ?Component Value Date  ? CHOL 149 03/13/2021  ? HDL 60 03/13/2021  ? [name] 68 03/13/2021  ? TRIG 120 03/13/2021  ? CHOLHDL 2.5 03/13/2021  ? ? ? ?Controlled, no change in medication ? ? ?Headache ?Controlled, no change in medication ? ? ?Overweight (BMI 25.0-29.9) ? ?Patient re-educated about  the importance of commitment to a  minimum of 150 minutes of exercise per week as able. ? ?The importance of healthy food choices with portion control discussed, as well as eating regularly and within a 12 hour window most days. ?The need to choose "clean , green" food 50 to 75% of the time is discussed, as well as to make water the primary drink and  set a goal of 64 ounces water daily. ? ?  ?Weight /BMI 03/13/2021 02/07/2021 01/15/2021  ?WEIGHT 134 lb 1.3 oz 134 lb 132 lb 3.2 oz  ?HEIGHT '4\' 11"'$  '4\' 11"'$  '4\' 11"'$   ?BMI 27.08 kg/m2 27.06 kg/m2 26.7 kg/m2  ? ? ? ? ?Prediabetes ?Patient educated about the importance of limiting  Carbohydrate intake , the need to commit to daily physical activity for a minimum of 30 minutes , and to commit weight loss. ?The fact that changes in all these areas will reduce or eliminate all together the development of  diabetes is stressed.  ? ?Diabetic Labs Latest Ref Rng & Units 03/13/2021 01/15/2021 10/24/2020 08/14/2020 05/30/2020  ?HbA1c 4.8 - 5.6 % 6.2(H) - 6.1(H) - 6.3(H)  ?Microalbumin mg/dL - - - - -  ?Micro/Creat Ratio 0 - 29 mg/g creat - - - - -  ?Chol 100 - 199 mg/dL 149 - 157 - 146  ?HDL >39 mg/dL 60 - 59 - 61  ?Calc LDL 0 - 99 mg/dL 68 - 80 - 69  ?Triglycerides 0 - 149 mg/dL 120 - 100 - 83  ?Creatinine 0.57 - 1.00 mg/dL 0.94 1.05(H) 0.92 0.95 0.85  ? ?BP/Weight 03/13/2021 02/07/2021 01/15/2021 01/09/2021 10/24/2020 08/22/2020 08/14/2020  ?Systolic BP 045 - 997 741 142 160 144  ?Diastolic BP 80 - 63 70 70 80 85  ?Wt. (Lbs) 134.08 134 132.2 136 136.12 139 133.4  ?BMI 27.08 27.06 26.7 27.47 27.49 27.15 26.49  ? ?Foot/eye exam completion dates Latest Ref Rng & Units 11/22/2019 05/08/2019  ?Eye Exam No Retinopathy No Retinopathy -  ?Foot Form Completion - - Done  ? ? ? ? ?

## 2021-03-14 NOTE — Assessment & Plan Note (Signed)
?  Patient re-educated about  the importance of commitment to a  minimum of 150 minutes of exercise per week as able. ? ?The importance of healthy food choices with portion control discussed, as well as eating regularly and within a 12 hour window most days. ?The need to choose "clean , green" food 50 to 75% of the time is discussed, as well as to make water the primary drink and set a goal of 64 ounces water daily. ? ?  ?Weight /BMI 03/13/2021 02/07/2021 01/15/2021  ?WEIGHT 134 lb 1.3 oz 134 lb 132 lb 3.2 oz  ?HEIGHT '4\' 11"'$  '4\' 11"'$  '4\' 11"'$   ?BMI 27.08 kg/m2 27.06 kg/m2 26.7 kg/m2  ? ? ? ?

## 2021-03-15 ENCOUNTER — Other Ambulatory Visit: Payer: Self-pay | Admitting: Rheumatology

## 2021-03-15 ENCOUNTER — Other Ambulatory Visit: Payer: Self-pay | Admitting: Family Medicine

## 2021-03-15 ENCOUNTER — Other Ambulatory Visit: Payer: Self-pay | Admitting: "Endocrinology

## 2021-03-15 DIAGNOSIS — E669 Obesity, unspecified: Secondary | ICD-10-CM

## 2021-03-15 DIAGNOSIS — M0579 Rheumatoid arthritis with rheumatoid factor of multiple sites without organ or systems involvement: Secondary | ICD-10-CM

## 2021-03-15 DIAGNOSIS — R7989 Other specified abnormal findings of blood chemistry: Secondary | ICD-10-CM

## 2021-03-15 DIAGNOSIS — I10 Essential (primary) hypertension: Secondary | ICD-10-CM

## 2021-03-15 DIAGNOSIS — E785 Hyperlipidemia, unspecified: Secondary | ICD-10-CM

## 2021-03-15 DIAGNOSIS — E1169 Type 2 diabetes mellitus with other specified complication: Secondary | ICD-10-CM

## 2021-03-15 DIAGNOSIS — E559 Vitamin D deficiency, unspecified: Secondary | ICD-10-CM

## 2021-03-17 NOTE — Telephone Encounter (Signed)
Next Visit: 06/19/2021 ? ?Last Visit: 01/15/2021 ? ?Last Fill: 12/26/2020 ? ?Dx: Rheumatoid arthritis with rheumatoid factor of multiple sites without organ or systems involvement  ? ?Current Dose per office note on 01/15/2021: prednisone 1 mg 4 tablets daily ? ?Okay to refill Prednisone?   ?

## 2021-03-24 ENCOUNTER — Telehealth: Payer: Self-pay | Admitting: *Deleted

## 2021-03-24 NOTE — Chronic Care Management (AMB) (Signed)
?  Care Management  ? ?Note ? ?03/24/2021 ?Name: Stacy Meyers MRN: [id number] DOB: [birth year] ? ?Stacy Meyers is a 88 y.o. year old female who is a primary care patient of Fayrene Helper, MD and is actively engaged with the care management team. I reached out to [name] by phone today to assist with re-scheduling a follow up visit with the Pharmacist ? ?Follow up plan: ?Unsuccessful telephone outreach attempt made. A HIPAA compliant phone message was left for the patient providing contact information and requesting a return call.  ?The care management team will reach out to the patient again over the next 7 days.  ?If patient returns call to provider office, please advise to call [place] at [phone]. ? ?Laverda Sorenson  ?Care Guide, Embedded Care Coordination ?[place]  Care Management  ?Direct Dial: [phone] ? ?

## 2021-03-26 NOTE — Chronic Care Management (AMB) (Signed)
?  Care Management  ? ?Note ? ?03/26/2021 ?Name: Stacy Meyers MRN: [id number] DOB: Dec 29, 1932 ? ?Stacy Meyers is a 88 y.o. year old female who is a primary care patient of Fayrene Helper, MD and is actively engaged with the care management team. I reached out to [name] by phone today to assist with re-scheduling a follow up visit with the Pharmacist ? ?Follow up plan: ?Telephone appointment with care management team member scheduled for:03/28/21 ? ?Laverda Sorenson  ?Care Guide, Embedded Care Coordination ?[place]  Care Management  ?Direct Dial: [phone] ? ?

## 2021-03-28 ENCOUNTER — Ambulatory Visit (INDEPENDENT_AMBULATORY_CARE_PROVIDER_SITE_OTHER): Payer: Medicare PPO | Admitting: Pharmacist

## 2021-03-28 DIAGNOSIS — M0579 Rheumatoid arthritis with rheumatoid factor of multiple sites without organ or systems involvement: Secondary | ICD-10-CM

## 2021-03-28 DIAGNOSIS — R7303 Prediabetes: Secondary | ICD-10-CM

## 2021-03-28 DIAGNOSIS — I10 Essential (primary) hypertension: Secondary | ICD-10-CM

## 2021-03-28 DIAGNOSIS — E785 Hyperlipidemia, unspecified: Secondary | ICD-10-CM

## 2021-03-28 NOTE — Chronic Care Management (AMB) (Signed)
? ? ?Chronic Care Management ?Pharmacy Note ? ?03/28/2021 ?Name:  Stacy Meyers MRN:  [id number] DOB:  03-07-1932 ? ?Summary: ?General  ?Patient has difficulty remembering what her medications are for. Spent some time reviewing current medication management and what each of them are for. Patient wrote down what they are for since she has some memory impairment.  ?Patient reports she has not started taking potassium supplementation as she was unsure of instructions. Discussed that primary care provider prescribed this due to low potassium levels on most recent lab work. Patient instructed to take 1 tablet by mouth daily as prescribed by primary care provider. ? ?Hypertension ?BP under improved control at last 2 office visits.  Blood pressure is at goal of <130/80 mmHg per 2017 AHA/ACC guidelines. ?Continue amlodipine 5 mg by mouth once daily and hydrochlorothiazide 12.5 mg by mouth once daily for now. If blood pressure continues to be elevated, may be reasonable to consider adding low dose ARB due to albuminuria. ? ?Subjective: ?Stacy Meyers is an 88 y.o. year old female who is a primary patient of Moshe Cipro, Norwood Levo, MD.  The CCM team was consulted for assistance with disease management and care coordination needs.   ? ?Engaged with patient by telephone for follow up visit in response to provider referral for pharmacy case management and/or care coordination services.  ? ?Consent to Services:  ?The patient was given information about Chronic Care Management services, agreed to services, and gave verbal consent prior to initiation of services.  Please see initial visit note for detailed documentation.  ? ?Patient Care Team: ?Fayrene Helper, MD as PCP - General ?Harl Bowie Alphonse Guild, MD as PCP - Cardiology (Cardiology) ?Estanislado Emms, MD (Inactive) as Consulting Physician (Nephrology) ?Bo Merino, MD as Consulting Physician (Rheumatology) ?Beryle Lathe, [facility] (Pharmacist) ?Kassie Mends, RN as  [facility] ? ?Objective: ? ?Lab Results  ?Component Value Date  ? CREATININE 0.94 03/13/2021  ? CREATININE 1.05 (H) 01/15/2021  ? CREATININE 0.92 10/24/2020  ? ? ?Lab Results  ?Component Value Date  ? HGBA1C 6.2 (H) 03/13/2021  ? ?Last diabetic Eye exam:  ?Lab Results  ?Component Value Date/Time  ? HMDIABEYEEXA No Retinopathy 11/22/2019 12:00 AM  ?  ?Last diabetic Foot exam: No results found for: HMDIABFOOTEX  ? ?   ?Component Value Date/Time  ? CHOL 149 03/13/2021 1051  ? TRIG 120 03/13/2021 1051  ? HDL 60 03/13/2021 1051  ? CHOLHDL 2.5 03/13/2021 1051  ? CHOLHDL 2.6 10/24/2019 1100  ? VLDL 26 02/24/2016 0832  ? [place] 68 03/13/2021 1051  ? [place] 69 10/24/2019 1100  ? ? ? ?  Latest Ref Rng & Units 03/13/2021  ? 10:51 AM 01/15/2021  ?  9:35 AM 10/24/2020  ? 10:43 AM  ?Hepatic Function  ?Total Protein 6.0 - 8.5 g/dL 6.5   6.5   6.9    ?Albumin 3.6 - 4.6 g/dL 4.3    4.5    ?AST 0 - 40 IU/L _0 ?ALT 0 - 32 IU/L _1 ?Alk Phosphatase 44 - 121 IU/L 67    64    ?Total Bilirubin 0.0 - 1.2 mg/dL 0.7   0.5   0.4    ? ? ?Lab Results  ?Component Value Date/Time  ? TSH 1.390 03/13/2021 10:51 AM  ? TSH 2.560 02/02/2020 08:55 AM  ? FREET4 1.29 02/02/2020 08:55 AM  ?  FREET4 0.97 12/15/2010 04:59 AM  ? ? ? ?  Latest Ref Rng & Units 01/15/2021  ?  9:35 AM 08/14/2020  ? 10:30 AM 03/12/2020  ? 11:02 AM  ?CBC  ?WBC 3.8 - 10.8 Thousand/uL 6.6   6.4   7.2    ?Hemoglobin 11.7 - 15.5 g/dL 13.5   13.5   13.4    ?Hematocrit 35.0 - 45.0 % 40.3   41.5   40.1    ?Platelets 140 - 400 Thousand/uL 249   258   283    ? ? ?Lab Results  ?Component Value Date/Time  ? VD25OH 35.0 03/13/2021 10:51 AM  ? VD25OH 30.2 05/30/2020 10:07 AM  ? ? ?Clinical ASCVD: No  ?The ASCVD Risk score (Arnett DK, et al., 2019) failed to calculate for the following reasons: ?  The 2019 ASCVD risk score is only valid for ages 52 to 34   ? ?Social History  ? ?Tobacco Use  ?Smoking Status Never  ?Smokeless Tobacco Never  ? ?BP  Readings from Last 3 Encounters:  ?03/13/21 120/80  ?01/15/21 109/63  ?01/09/21 (!) 161/70  ? ?Pulse Readings from Last 3 Encounters:  ?03/13/21 61  ?01/15/21 (!) 57  ?01/09/21 68  ? ?Wt Readings from Last 3 Encounters:  ?03/13/21 134 lb 1.3 oz (60.8 kg)  ?02/07/21 134 lb (60.8 kg)  ?01/15/21 132 lb 3.2 oz (60 kg)  ? ? ?Assessment: Review of patient past medical history, allergies, medications, health status, including review of consultants reports, laboratory and other test data, was performed as part of comprehensive evaluation and provision of chronic care management services.  ? ?SDOH:  (Social Determinants of Health) assessments and interventions performed:  ? ? ?CCM Care Plan ? ?Allergies  ?Allergen Reactions  ? Aspirin Other (See Comments)  ?  REACTION: burns stomach (uncoated)  ? Sudafed [Pseudoephedrine Hcl] Other (See Comments)  ?  Increased joint pain  ? ? ?Medications Reviewed Today   ? ? Reviewed by Beryle Lathe, [facility] (Pharmacist) on 03/28/21 at 1051  Med List Status: <None>  ? ?Medication Order Taking? Sig Documenting Provider Last Dose Status Informant  ?acetaminophen (TYLENOL) 500 MG tablet [id number] Yes Take 1,000 mg by mouth every 6 (six) hours as needed (for pain).  [provider] Taking Active Family Member  ?acyclovir (ZOVIRAX) 400 MG tablet [id number] Yes TAKE 1 TABLET BY MOUTH TWICE A DAY Fayrene Helper, MD Taking Active   ?alendronate (FOSAMAX) 70 MG tablet [id number] Yes TAKE 1 TABLET BY MOUTH EVERY 7 DAYS. TAKE WITH A FULL GLASS OF WATER ON AN EMPTY STOMACH. Cassandria Anger, MD Taking Active   ?         ?Med Note Beryle Lathe   Fri Mar 28, 2021 10:44 AM) Dewaine Conger every Thursday  ?amLODipine (NORVASC) 5 MG tablet [id number] Yes TAKE 1 TABLET BY MOUTH EVERY DAY Fayrene Helper, MD Taking Active   ?butalbital-acetaminophen-caffeine (FIORICET) 50-325-40 MG tablet [id number] No Take one tablet by mouth once daily, as needed, for severe headache  ?Patient not  taking: Reported on 03/28/2021  ? Fayrene Helper, MD Not Taking Active   ?folic acid (FOLVITE) 1 MG tablet [id number] Yes TAKE 1 TABLET BY MOUTH EVERY DAY Fayrene Helper, MD Taking Active   ?hydrochlorothiazide (MICROZIDE) 12.5 MG capsule [id number] Yes TAKE 1 CAPSULE BY MOUTH EVERY DAY Fayrene Helper, MD Taking Active   ?KLOR-CON M20 20 MEQ tablet [id number] No TAKE 1 TABLET BY MOUTH EVERY DAY  ?Patient not  taking: Reported on 03/28/2021  ? Fayrene Helper, MD Not Taking Active   ?methotrexate (RHEUMATREX) 2.5 MG tablet [id number] Yes TAKE 3 TABLETS BY MOUTH EACH WEEK ON THE SAME DAY. Fayrene Helper, MD Taking Active   ?         ?Med Note Beryle Lathe   Fri Mar 28, 2021 10:45 AM) Dewaine Conger every Thursday  ?montelukast (SINGULAIR) 10 MG tablet [id number] Yes TAKE 1 TABLET BY MOUTH EVERYDAY AT BEDTIME Fayrene Helper, MD Taking Active   ?predniSONE (DELTASONE) 1 MG tablet [id number] Yes TAKE 4 TABLETS (4 MG TOTAL) BY MOUTH DAILY WITH BREAKFAST Ofilia Neas, PA-C Taking Active   ?Propylene Glycol (SYSTANE BALANCE OP) [id number] Yes Place 1 drop into both eyes daily as needed (for dry eyes).  [provider] Taking Active Family Member  ?rosuvastatin (CRESTOR) 5 MG tablet [id number] Yes TAKE 1 TABLET BY MOUTH EVERY OTHER DAY Fayrene Helper, MD Taking Active   ? ?  ?  ? ?  ? ? ?Patient Active Problem List  ? Diagnosis Date Noted  ? Age related osteoporosis 02/07/2021  ? Acute cystitis without hematuria 10/27/2020  ? Mass of left lower leg 10/24/2020  ? Headache 08/26/2020  ? Prediabetes 10/28/2019  ? H/o Chronic heart failure with preserved ejection fraction (HFpEF) ([place]) 04/03/2019  ? Umbilical hernia without obstruction and without gangrene 12/10/2018  ? Fibroids 05/08/2016  ? Spondylosis of lumbar region without myelopathy or radiculopathy 12/24/2015  ? Solitary bone cyst of right shoulder 05/28/2015  ? Right knee pain 05/28/2015  ? Osteoarthritis of both shoulders 05/28/2015   ? Overweight (BMI 25.0-29.9) 04/28/2014  ? Ganglion cyst of wrist 05/11/2012  ? Systolic murmur 42/35/3614  ? Diastolic dysfunction 43/15/4008  ? Hypercalcemia 12/14/2010  ? Seasonal allergies 08/28/2010

## 2021-03-28 NOTE — Patient Instructions (Signed)
Stacy Meyers, ? ?It was great to talk to you today! ? ?Please call me with any questions or concerns. ? ?Visit Information ? ?Following are the goals we discussed today:  ? Goals Addressed   ? ?  ?  ?  ?  ? This Visit's Progress  ?  Medication Management     ?  Patient Goals/Self-Care Activities ?Patient will:  ?Focus on medication adherence by keeping up with prescription refills and either using a pill box or reminders to take your medications at the prescribed times ?Check blood pressure at least once daily, document, and provide at future appointments ?Engage in dietary modifications by decreased sodium intake and adequate fluid intake (at least 6 cups of fluid per day) ? ?  ? ?  ?  ? ?Follow-up plan: Next PCP appointment scheduled for: 10/23/21 ? ?The patient verbalized understanding of instructions, educational materials, and care plan provided today and declined offer to receive copy of patient instructions, educational materials, and care plan.  ? ?Please call the care guide team at [phone] if you need to cancel or reschedule your appointment.  ? ?Kennon Holter, PharmD, BCACP, CPP ?Clinical Pharmacist Practitioner ?[place] ?[phone]  ?

## 2021-04-04 DIAGNOSIS — E785 Hyperlipidemia, unspecified: Secondary | ICD-10-CM

## 2021-04-04 DIAGNOSIS — I10 Essential (primary) hypertension: Secondary | ICD-10-CM | POA: Diagnosis not present

## 2021-04-04 DIAGNOSIS — M0579 Rheumatoid arthritis with rheumatoid factor of multiple sites without organ or systems involvement: Secondary | ICD-10-CM | POA: Diagnosis not present

## 2021-04-11 ENCOUNTER — Other Ambulatory Visit: Payer: Self-pay | Admitting: Physician Assistant

## 2021-05-19 ENCOUNTER — Encounter: Payer: Self-pay | Admitting: Internal Medicine

## 2021-05-19 ENCOUNTER — Ambulatory Visit (INDEPENDENT_AMBULATORY_CARE_PROVIDER_SITE_OTHER): Payer: Medicare PPO | Admitting: Internal Medicine

## 2021-05-19 DIAGNOSIS — J0110 Acute frontal sinusitis, unspecified: Secondary | ICD-10-CM

## 2021-05-19 DIAGNOSIS — Z20822 Contact with and (suspected) exposure to covid-19: Secondary | ICD-10-CM

## 2021-05-19 MED ORDER — AZITHROMYCIN 250 MG PO TABS: ORAL_TABLET | ORAL | 0 refills | Status: AC

## 2021-05-19 NOTE — Progress Notes (Signed)
?  ? ?Virtual Visit via Telephone Note  ? ?This visit type was conducted due to national recommendations for restrictions regarding the COVID-19 Pandemic (e.g. social distancing) in an effort to limit this patient's exposure and mitigate transmission in our community.  Due to her co-morbid illnesses, this patient is at least at moderate risk for complications without adequate follow up.  This format is felt to be most appropriate for this patient at this time.  The patient did not have access to video technology/had technical difficulties with video requiring transitioning to audio format only (telephone).  All issues noted in this document were discussed and addressed.  No physical exam could be performed with this format. ? ?Evaluation Performed:  Follow-up visit ? ?Date:  05/19/2021  ? ?ID:  Stacy Meyers, DOB 1932/05/29, MRN [id number] ? ?Patient Location: Home ?Provider Location: Office/Clinic ? ?Participants: Patient and granddaughter - Stacy Meyers ?Location of Patient: Home ?Location of Provider: Telehealth ?Consent was obtain for visit to be over via telehealth. ?I verified that I am speaking with the correct person using two identifiers. ? ?PCP:  Fayrene Helper, MD  ? ?Chief Complaint: Cough, fever and nasal congestion ? ?History of Present Illness:   ? ?Stacy Meyers is a 87 y.o. female who has a televisit for c/o cough, fever and nasal congestion for the last 4 days. She denies any dyspnea or wheezing. She has tried OTC cough syrup and Alka-Seltzer with no relief. ? ?The patient does have symptoms concerning for COVID-19 infection (fever, chills, cough, or new shortness of breath).  ? ?Past Medical, Surgical, Social History, Allergies, and Medications have been Reviewed. ? ?Past Medical History:  ?Diagnosis Date  ? Acid reflux   ? Acute cystitis with hematuria 04/11/2011  ? Anemia   ? ARF (acute renal failure) ([place]) 12/14/2010  ? Arthritis   ? CKD (chronic kidney disease), stage II   ? previously seen by  nephrologist--- dr Florene Glen,  pt released per lov in epic 09-24-2017  ? Diabetes mellitus, type II ([place])   ? Diverticulosis of colon   ? Essential hypertension, benign   ? followed by pcp   (pt had nuclear stress test (in epic under media) dated 05-25-2008 showed normal perfusion w/ no evidence ischemia, ef 83%  ? Heart disease   ? History of bradycardia   ? previously seen by cardiologist, dr branch, pt released per lov 08-11-2018 in epic, bradycardia resolved  ? History of sepsis   ? 04-03-2019 hospital admission w/ urosepsis due to obstructive uropathy due to right ureter stone  ? Hypokalemia   ? Lipoma of colon   ? ascending  ? Lumbar spondylosis   ? Mixed hyperlipidemia   ? Nephrolithiasis   ? per CT 04-03-2019 nonobstructive bilateral stones  ? Ocular herpes   ? Osteoarthritis   ? followed by dr deveshwar--- hands, shoulders, feet  ? Osteoporosis   ? Rheumatoid arthritis(714.0)   ? rheumotologist-- dr s. Estanislado Pandy---  multiple sites, treated with methotrexate/ prednisone  ? Right nephrolithiasis-1.4cm Moderate right-sided urinary tract obstruction/hydronephrosis 04/03/2019  ? Right ureteral calculus   ? Seasonal allergies   ? Sepsis ([place]) 04/03/2019  ? Sepsis secondary to UTI /Nephrolithiasis with obstructive uropathy 04/03/2019  ? SUI (stress urinary incontinence, female)   ? Uterine fibroid   ? ?Past Surgical History:  ?Procedure Laterality Date  ? BUNIONECTOMY Right 2012  ? CATARACT EXTRACTION W/ INTRAOCULAR LENS  IMPLANT, BILATERAL  2009  ? CHOLECYSTECTOMY  12/17/2010  ? Procedure: LAPAROSCOPIC CHOLECYSTECTOMY;  Surgeon: Donato Heinz;  Location: [facility];  Service: General;  Laterality: N/A;  ? COLONOSCOPY  05/22/2011  ? Procedure: COLONOSCOPY;  Surgeon: Rogene Houston, MD;  Location: [facility];  Service: Endoscopy;  Laterality: N/A;  1045  ? CYSTOSCOPY WITH RETROGRADE PYELOGRAM, URETEROSCOPY AND STENT PLACEMENT Right 05/09/2019  ? Procedure: CYSTOSCOPY WITH RETROGRADE PYELOGRAM, URETEROSCOPY AND STENT  PLACEMENT;  Surgeon: Robley Fries, MD;  Location: [facility];  Service: Urology;  Laterality: Right;  1 HR  ? CYSTOSCOPY WITH STENT PLACEMENT Right 04/03/2019  ? Procedure: CYSTOSCOPY, RIGHT RETROGRADE PYELOGRAM  WITH STENT PLACEMENT;  Surgeon: Robley Fries, MD;  Location: [facility];  Service: Urology;  Laterality: Right;  ? EXCISION BONE CYST Right 2010  ? RIGHT SHOULDER  ? GANGLION CYST EXCISION Left 06/02/2012  ? Procedure: Excision ganglion left dorsal wrist;  Surgeon: Sanjuana Kava, MD;  Location: [facility];  Service: Orthopedics;  Laterality: Left;  ? HOLMIUM LASER APPLICATION Right 07/10/2829  ? Procedure: HOLMIUM LASER APPLICATION;  Surgeon: Robley Fries, MD;  Location: [facility];  Service: Urology;  Laterality: Right;  ? kidney stones    ? LUMBAR MICRODISCECTOMY  07-16-2000   '@[facility]'$   ? L 2  -3  ? POSTERIOR LAMINECTOMY / DECOMPRESSION LUMBAR SPINE  06-26-2009   '@[facility]'$   ? w/ LATERAL MASS FUSION , L3 -- 5  ? SHOULDER ARTHROSCOPY WITH DISTAL CLAVICLE RESECTION Right 01-10-2009   '@[facility]'$   ? w/ DEBRIDEMENT AND EXCISION RECURRENT GANGLION CYST  ?  ? ?Current Meds  ?Medication Sig  ? acetaminophen (TYLENOL) 500 MG tablet Take 1,000 mg by mouth every 6 (six) hours as needed (for pain).   ? acyclovir (ZOVIRAX) 400 MG tablet TAKE 1 TABLET BY MOUTH TWICE A DAY  ? alendronate (FOSAMAX) 70 MG tablet TAKE 1 TABLET BY MOUTH EVERY 7 DAYS. TAKE WITH A FULL GLASS OF WATER ON AN EMPTY STOMACH.  ? amLODipine (NORVASC) 5 MG tablet TAKE 1 TABLET BY MOUTH EVERY DAY  ? butalbital-acetaminophen-caffeine (FIORICET) 50-325-40 MG tablet Take one tablet by mouth once daily, as needed, for severe headache  ? folic acid (FOLVITE) 1 MG tablet TAKE 1 TABLET BY MOUTH EVERY DAY  ? hydrochlorothiazide (MICROZIDE) 12.5 MG capsule TAKE 1 CAPSULE BY MOUTH EVERY DAY  ? KLOR-CON M20 20 MEQ tablet TAKE 1 TABLET BY MOUTH EVERY DAY  ? methotrexate (RHEUMATREX) 2.5 MG tablet TAKE 3 TABLETS BY MOUTH EACH WEEK ON THE SAME  DAY.  ? montelukast (SINGULAIR) 10 MG tablet TAKE 1 TABLET BY MOUTH EVERYDAY AT BEDTIME  ? predniSONE (DELTASONE) 1 MG tablet TAKE 4 TABLETS (4 MG TOTAL) BY MOUTH DAILY WITH BREAKFAST  ? Propylene Glycol (SYSTANE BALANCE OP) Place 1 drop into both eyes daily as needed (for dry eyes).   ? rosuvastatin (CRESTOR) 5 MG tablet TAKE 1 TABLET BY MOUTH EVERY OTHER DAY  ?  ? ?Allergies:   Aspirin and Sudafed [pseudoephedrine hcl]  ? ?ROS:   ?Please see the history of present illness.    ? ?All other systems reviewed and are negative. ? ? ?Labs/Other Tests and Data Reviewed:   ? ?Recent Labs: ?01/15/2021: Hemoglobin 13.5; Platelets 249 ?03/13/2021: ALT 8; BUN 23; Creatinine, Ser 0.94; Potassium 3.4; Sodium 139; TSH 1.390  ? ?Recent Lipid Panel ?Lab Results  ?Component Value Date/Time  ? CHOL 149 03/13/2021 10:51 AM  ? TRIG 120 03/13/2021 10:51 AM  ? HDL 60 03/13/2021 10:51 AM  ? CHOLHDL 2.5 03/13/2021 10:51 AM  ?  CHOLHDL 2.6 10/24/2019 11:00 AM  ? LDLCALC 68 03/13/2021 10:51 AM  ? LDLCALC 69 10/24/2019 11:00 AM  ? ? ?Wt Readings from Last 3 Encounters:  ?03/13/21 134 lb 1.3 oz (60.8 kg)  ?02/07/21 134 lb (60.8 kg)  ?01/15/21 132 lb 3.2 oz (60 kg)  ?  ? ?ASSESSMENT & PLAN:   ? ?Acute sinusitis ?Suspected COVID-19 infection ?Started empiric azithromycin as she is at high risk of bacterial infection due to being on immunosuppressives ?Advised to check home COVID test ?Continue symptomatic treatment with Mucinex or Robitussin ?Nasal saline spray as needed ? ? ?Time:   ?Today, I have spent 9 minutes reviewing the chart, including problem list, medications, and with the patient with telehealth technology discussing the above problems. ? ? ?Medication Adjustments/Labs and Tests Ordered: ?Current medicines are reviewed at length with the patient today.  Concerns regarding medicines are outlined above.  ? ?Tests Ordered: ?No orders of the defined types were placed in this encounter. ? ? ?Medication Changes: ?No orders of the defined  types were placed in this encounter. ? ? ? ?Note: This dictation was prepared with Dragon dictation along with smaller phrase technology. Similar sounding words can be transcribed inadequately or may not be correcte

## 2021-05-22 ENCOUNTER — Ambulatory Visit: Payer: Medicare PPO | Admitting: Family Medicine

## 2021-05-26 ENCOUNTER — Telehealth: Payer: Self-pay

## 2021-05-26 ENCOUNTER — Encounter: Payer: Self-pay | Admitting: Internal Medicine

## 2021-05-26 ENCOUNTER — Ambulatory Visit (INDEPENDENT_AMBULATORY_CARE_PROVIDER_SITE_OTHER): Payer: Medicare PPO | Admitting: Internal Medicine

## 2021-05-26 VITALS — BP 138/78 | HR 59 | Resp 18 | Ht 59.5 in | Wt 134.4 lb

## 2021-05-26 DIAGNOSIS — J209 Acute bronchitis, unspecified: Secondary | ICD-10-CM

## 2021-05-26 MED ORDER — METHYLPREDNISOLONE SODIUM SUCC 125 MG IJ SOLR
125.0000 mg | Freq: Once | INTRAMUSCULAR | Status: AC
  Administered 2021-05-26: 125 mg via INTRAMUSCULAR

## 2021-05-26 MED ORDER — METHYLPREDNISOLONE 4 MG PO TBPK: ORAL_TABLET | ORAL | 0 refills | Status: DC

## 2021-05-26 MED ORDER — MONTELUKAST SODIUM 10 MG PO TABS: 10.0000 mg | ORAL_TABLET | Freq: Every day | ORAL | 1 refills | Status: DC

## 2021-05-26 MED ORDER — ALBUTEROL SULFATE HFA 108 (90 BASE) MCG/ACT IN AERS: 2.0000 | INHALATION_SPRAY | Freq: Four times a day (QID) | RESPIRATORY_TRACT | 0 refills | Status: DC | PRN

## 2021-05-26 MED ORDER — PROMETHAZINE-DM 6.25-15 MG/5ML PO SYRP: 5.0000 mL | ORAL_SOLUTION | Freq: Four times a day (QID) | ORAL | 0 refills | Status: DC | PRN

## 2021-05-26 NOTE — Patient Instructions (Signed)
Please start taking Prednisone as prescribed. Please do not take your current home dose of Prednisone until you complete Prednisone taper.  Please take Singulair for allergies.  Please use Albuterol as needed for shortness of breath or wheezing.

## 2021-05-26 NOTE — Progress Notes (Unsigned)
Acute Office Visit  Subjective:    Patient ID: Stacy Meyers, female    DOB: 05-10-1932, 88 y.o.   MRN: [id number]  Chief Complaint  Patient presents with   Cough    HPI Patient is in today for c/o dry cough for the last 2 weeks, which is associated with dyspnea as well.  She has been taking Mucinex for cough with some relief. She was recently treated with azithromycin for acute sinusitis, which had improved her nasal congestion.  She reports that she has house remodeling undergoing currently.  She denies any fever, chills, chest pain or palpitations.  She was given Singulair for allergies in the past, but it seems  that she has run out of it.  Past Medical History:  Diagnosis Date   Acid reflux    Acute cystitis with hematuria 04/11/2011   Anemia    ARF (acute renal failure) (HCC) 12/14/2010   Arthritis    CKD (chronic kidney disease), stage II    previously seen by nephrologist--- dr Lowell Guitar,  pt released per lov in epic 09-24-2017   Diabetes mellitus, type II (HCC)    Diverticulosis of colon    Essential hypertension, benign    followed by pcp   (pt had nuclear stress test (in epic under media) dated 05-25-2008 showed normal perfusion w/ no evidence ischemia, ef 83%   Heart disease    History of bradycardia    previously seen by cardiologist, dr branch, pt released per lov 08-11-2018 in epic, bradycardia resolved   History of sepsis    04-03-2019 hospital admission w/ urosepsis due to obstructive uropathy due to right ureter stone   Hypokalemia    Lipoma of colon    ascending   Lumbar spondylosis    Mixed hyperlipidemia    Nephrolithiasis    per CT 04-03-2019 nonobstructive bilateral stones   Ocular herpes    Osteoarthritis    followed by dr Corliss Skains--- hands, shoulders, feet   Osteoporosis    Rheumatoid arthritis(714.0)    rheumotologist-- dr s. Corliss Skains---  multiple sites, treated with methotrexate/ prednisone   Right nephrolithiasis-1.4cm Moderate right-sided  urinary tract obstruction/hydronephrosis 04/03/2019   Right ureteral calculus    Seasonal allergies    Sepsis (HCC) 04/03/2019   Sepsis secondary to UTI /Nephrolithiasis with obstructive uropathy 04/03/2019   SUI (stress urinary incontinence, female)    Uterine fibroid     Past Surgical History:  Procedure Laterality Date   BUNIONECTOMY Right 2012   CATARACT EXTRACTION W/ INTRAOCULAR LENS  IMPLANT, BILATERAL  2009   CHOLECYSTECTOMY  12/17/2010   Procedure: LAPAROSCOPIC CHOLECYSTECTOMY;  Surgeon: Fabio Bering;  Location: [facility];  Service: General;  Laterality: N/A;   COLONOSCOPY  05/22/2011   Procedure: COLONOSCOPY;  Surgeon: Malissa Hippo, MD;  Location: [facility];  Service: Endoscopy;  Laterality: N/A;  1045   CYSTOSCOPY WITH RETROGRADE PYELOGRAM, URETEROSCOPY AND STENT PLACEMENT Right 05/09/2019   Procedure: CYSTOSCOPY WITH RETROGRADE PYELOGRAM, URETEROSCOPY AND STENT PLACEMENT;  Surgeon: Noel Christmas, MD;  Location: [facility];  Service: Urology;  Laterality: Right;  1 HR   CYSTOSCOPY WITH STENT PLACEMENT Right 04/03/2019   Procedure: CYSTOSCOPY, RIGHT RETROGRADE PYELOGRAM  WITH STENT PLACEMENT;  Surgeon: Noel Christmas, MD;  Location: [facility];  Service: Urology;  Laterality: Right;   EXCISION BONE CYST Right 2010   RIGHT SHOULDER   GANGLION CYST EXCISION Left 06/02/2012   Procedure: Excision ganglion left dorsal wrist;  Surgeon: Darreld Mclean, MD;  Location: [facility];  Service: Orthopedics;  Laterality: Left;   HOLMIUM LASER APPLICATION Right 05/09/2019   Procedure: HOLMIUM LASER APPLICATION;  Surgeon: Noel Christmas, MD;  Location: [facility];  Service: Urology;  Laterality: Right;   kidney stones     LUMBAR MICRODISCECTOMY  07-16-2000   @[facility]    L 2  -3   POSTERIOR LAMINECTOMY / DECOMPRESSION LUMBAR SPINE  06-26-2009   @[facility]    w/ LATERAL MASS FUSION , L3 -- 5   SHOULDER ARTHROSCOPY WITH DISTAL CLAVICLE RESECTION Right 01-10-2009   @[facility]    w/  DEBRIDEMENT AND EXCISION RECURRENT GANGLION CYST    Family History  Problem Relation Age of Onset   Stroke Mother    Cancer Father    Diabetes Sister    Diabetes Sister    Kidney failure Brother    Kidney failure Brother    Pancreatic cancer Sister    Kidney Stones Sister        Only one Kidney   Arthritis Sister    Heart disease Daughter     Social History   Socioeconomic History   Marital status: Widowed    Spouse name: Not on file   Number of children: 1   Years of education: Not on file   Highest education level: Not on file  Occupational History   Occupation: Retired    Comment: [employer]  Tobacco Use   Smoking status: Never   Smokeless tobacco: Never  Vaping Use   Vaping Use: Never used  Substance and Sexual Activity   Alcohol use: No   Drug use: Never   Sexual activity: Not Currently    Birth control/protection: Post-menopausal  Other Topics Concern   Not on file  Social History Narrative   Husband passed away Apr 22, 2016. It has been financially harder to pay the bills since his passing, but she is making it. Has grandchildren around which makes it easier.    Social Determinants of Health   Financial Resource Strain: Low Risk    Difficulty of Paying Living Expenses: Not hard at all  Food Insecurity: No Food Insecurity   Worried About Programme researcher, broadcasting/film/video in the Last Year: Never true   Ran Out of Food in the Last Year: Never true  Transportation Needs: No Transportation Needs   Lack of Transportation (Medical): No   Lack of Transportation (Non-Medical): No  Physical Activity: Insufficiently Active   Days of Exercise per Week: 3 days   Minutes of Exercise per Session: 20 min  Stress: No Stress Concern Present   Feeling of Stress : Not at all  Social Connections: Moderately Isolated   Frequency of Communication with Friends and Family: More than three times a week   Frequency of Social Gatherings with Friends and Family: More than three times a week    Attends Religious Services: More than 4 times per year   Active Member of [employer] or Organizations: No   Attends Banker Meetings: Never   Marital Status: Widowed  Catering manager Violence: Not At Risk   Fear of Current or Ex-Partner: No   Emotionally Abused: No   Physically Abused: No   Sexually Abused: No    Outpatient Medications Prior to Visit  Medication Sig Dispense Refill   acetaminophen (TYLENOL) 500 MG tablet Take 1,000 mg by mouth every 6 (six) hours as needed (for pain).      acyclovir (ZOVIRAX) 400 MG tablet TAKE 1 TABLET BY MOUTH TWICE A DAY 180  tablet 1   alendronate (FOSAMAX) 70 MG tablet TAKE 1 TABLET BY MOUTH EVERY 7 DAYS. TAKE WITH A FULL GLASS OF WATER ON AN EMPTY STOMACH. 12 tablet 1   amLODipine (NORVASC) 5 MG tablet TAKE 1 TABLET BY MOUTH EVERY DAY 90 tablet 0   butalbital-acetaminophen-caffeine (FIORICET) 50-325-40 MG tablet Take one tablet by mouth once daily, as needed, for severe headache 14 tablet 0   folic acid (FOLVITE) 1 MG tablet TAKE 1 TABLET BY MOUTH EVERY DAY 90 tablet 3   hydrochlorothiazide (MICROZIDE) 12.5 MG capsule TAKE 1 CAPSULE BY MOUTH EVERY DAY 90 capsule 1   KLOR-CON M20 20 MEQ tablet TAKE 1 TABLET BY MOUTH EVERY DAY 90 tablet 1   methotrexate (RHEUMATREX) 2.5 MG tablet TAKE 3 TABLETS BY MOUTH EACH WEEK ON THE SAME DAY. 36 tablet 2   predniSONE (DELTASONE) 1 MG tablet TAKE 4 TABLETS (4 MG TOTAL) BY MOUTH DAILY WITH BREAKFAST 360 tablet 0   Propylene Glycol (SYSTANE BALANCE OP) Place 1 drop into both eyes daily as needed (for dry eyes).      rosuvastatin (CRESTOR) 5 MG tablet TAKE 1 TABLET BY MOUTH EVERY OTHER DAY 45 tablet 0   montelukast (SINGULAIR) 10 MG tablet TAKE 1 TABLET BY MOUTH EVERYDAY AT BEDTIME 90 tablet 1   No facility-administered medications prior to visit.    Allergies  Allergen Reactions   Aspirin Other (See Comments)    REACTION: burns stomach (uncoated)   Sudafed [Pseudoephedrine Hcl] Other (See Comments)     Increased joint pain    Review of Systems  Constitutional:  Negative for chills and fever.  HENT:  Negative for congestion, sinus pressure and sore throat.   Eyes:  Negative for pain and discharge.  Respiratory:  Positive for cough and shortness of breath.   Cardiovascular:  Negative for chest pain and palpitations.  Gastrointestinal:  Negative for abdominal pain, diarrhea, nausea and vomiting.  Endocrine: Negative for polydipsia and polyuria.  Genitourinary:  Negative for dysuria and hematuria.  Musculoskeletal:  Negative for neck pain and neck stiffness.  Skin:  Negative for rash.  Neurological:  Negative for dizziness and weakness.  Psychiatric/Behavioral:  Negative for agitation and behavioral problems.       Objective:    Physical Exam Vitals reviewed.  Constitutional:      General: She is not in acute distress.    Appearance: She is not diaphoretic.  HENT:     Head: Normocephalic and atraumatic.     Nose: Nose normal.     Mouth/Throat:     Mouth: Mucous membranes are moist.  Eyes:     General: No scleral icterus.    Extraocular Movements: Extraocular movements intact.  Cardiovascular:     Rate and Rhythm: Normal rate and regular rhythm.     Pulses: Normal pulses.     Heart sounds: Normal heart sounds. No murmur heard. Pulmonary:     Breath sounds: Wheezing (Mild) present. No rales.  Abdominal:     Palpations: Abdomen is soft.     Tenderness: There is no abdominal tenderness.  Musculoskeletal:     Cervical back: Neck supple. No tenderness.     Right lower leg: No edema.     Left lower leg: No edema.  Skin:    General: Skin is warm.     Findings: No rash.  Neurological:     General: No focal deficit present.     Mental Status: She is alert and oriented to person, place, and  time.  Psychiatric:        Mood and Affect: Mood normal.        Behavior: Behavior normal.    BP 138/78 (BP Location: Right Arm, Patient Position: Sitting, Cuff Size: Normal)    Pulse (!) 59   Resp 18   Ht 4' 11.5" (1.511 m)   Wt 134 lb 6.4 oz (61 kg)   SpO2 96%   BMI 26.69 kg/m  Wt Readings from Last 3 Encounters:  05/26/21 134 lb 6.4 oz (61 kg)  03/13/21 134 lb 1.3 oz (60.8 kg)  02/07/21 134 lb (60.8 kg)        Assessment & Plan:   Problem List Items Addressed This Visit    Visit Diagnoses     Acute bronchitis, unspecified organism    -  Primary Severe coughing with dyspnea and mild wheezing Likely acute bronchitis in the setting of dust exposure at home Solu-Medrol IM in the office today, followed by Medrol Dosepak Promethazine DM syrup as needed for cough Albuterol as needed for dyspnea or wheezing Refilled Singulair for allergies.   Relevant Medications   methylPREDNISolone (MEDROL DOSEPAK) 4 MG TBPK tablet   albuterol (VENTOLIN HFA) 108 (90 Base) MCG/ACT inhaler   montelukast (SINGULAIR) 10 MG tablet   promethazine-dextromethorphan (PROMETHAZINE-DM) 6.25-15 MG/5ML syrup   methylPREDNISolone sodium succinate (SOLU-MEDROL) 125 mg/2 mL injection 125 mg (Completed)        Meds ordered this encounter  Medications   methylPREDNISolone (MEDROL DOSEPAK) 4 MG TBPK tablet    Sig: Take as package instructions.    Dispense:  1 each    Refill:  0   albuterol (VENTOLIN HFA) 108 (90 Base) MCG/ACT inhaler    Sig: Inhale 2 puffs into the lungs every 6 (six) hours as needed for wheezing or shortness of breath.    Dispense:  8 g    Refill:  0    Okay to substitute to generic/formulary Albuterol.   montelukast (SINGULAIR) 10 MG tablet    Sig: Take 1 tablet (10 mg total) by mouth at bedtime.    Dispense:  90 tablet    Refill:  1   promethazine-dextromethorphan (PROMETHAZINE-DM) 6.25-15 MG/5ML syrup    Sig: Take 5 mLs by mouth 4 (four) times daily as needed for cough.    Dispense:  118 mL    Refill:  0   methylPREDNISolone sodium succinate (SOLU-MEDROL) 125 mg/2 mL injection 125 mg     Anabel Halon, MD

## 2021-05-26 NOTE — Telephone Encounter (Signed)
Patient granddaughter Stacy Meyers called said patient has a lot of dust and mold in her house, had to do a whole renovation their bathroom due to mold.  Maybe this is why her coughing.

## 2021-05-26 NOTE — Telephone Encounter (Signed)
Pt had visit 05-26-21 with Dr Posey Pronto discuss with pt before granddaughter called

## 2021-05-27 ENCOUNTER — Ambulatory Visit (INDEPENDENT_AMBULATORY_CARE_PROVIDER_SITE_OTHER): Payer: Medicare PPO | Admitting: *Deleted

## 2021-05-27 DIAGNOSIS — E785 Hyperlipidemia, unspecified: Secondary | ICD-10-CM

## 2021-05-27 DIAGNOSIS — I10 Essential (primary) hypertension: Secondary | ICD-10-CM

## 2021-05-27 NOTE — Chronic Care Management (AMB) (Signed)
Chronic Care Management   CCM RN Visit Note  05/27/2021 Name: Stacy Meyers MRN: [id number] DOB: [birth year]  Subjective: Stacy Meyers is a 88 y.o. year old female who is a primary care patient of Stacy Helper, MD. The care management team was consulted for assistance with disease management and care coordination needs.    Engaged with patient by telephone for initial visit in response to provider referral for case management and/or care coordination services.   Consent to Services:  The patient was given the following information about Chronic Care Management services today, agreed to services, and gave verbal consent: 1. CCM service includes personalized support from designated clinical staff supervised by the primary care provider, including individualized plan of care and coordination with other care providers 2. 24/7 contact phone numbers for assistance for urgent and routine care needs. 3. Service will only be billed when office clinical staff spend 20 minutes or more in a month to coordinate care. 4. Only one practitioner may furnish and bill the service in a calendar month. 5.The patient may stop CCM services at any time (effective at the end of the month) by phone call to the office staff. 6. The patient will be responsible for cost sharing (co-pay) of up to 20% of the service fee (after annual deductible is met). Patient agreed to services and consent obtained.  Patient agreed to services and verbal consent obtained.   Assessment: Review of patient past medical history, allergies, medications, health status, including review of consultants reports, laboratory and other test data, was performed as part of comprehensive evaluation and provision of chronic care management services.   SDOH (Social Determinants of Health) assessments and interventions performed:  SDOH Interventions    Flowsheet Row Most Recent Value  SDOH Interventions   Food Insecurity Interventions Intervention  Not Indicated  Housing Interventions Intervention Not Indicated  Transportation Interventions Intervention Not Indicated        CCM Care Plan  Allergies  Allergen Reactions   Aspirin Other (See Comments)    REACTION: burns stomach (uncoated)   Sudafed [Pseudoephedrine Hcl] Other (See Comments)    Increased joint pain    Outpatient Encounter Medications as of 05/27/2021  Medication Sig Note   acetaminophen (TYLENOL) 500 MG tablet Take 1,000 mg by mouth every 6 (six) hours as needed (for pain).     acyclovir (ZOVIRAX) 400 MG tablet TAKE 1 TABLET BY MOUTH TWICE A DAY    albuterol (VENTOLIN HFA) 108 (90 Base) MCG/ACT inhaler Inhale 2 puffs into the lungs every 6 (six) hours as needed for wheezing or shortness of breath.    alendronate (FOSAMAX) 70 MG tablet TAKE 1 TABLET BY MOUTH EVERY 7 DAYS. TAKE WITH A FULL GLASS OF WATER ON AN EMPTY STOMACH. 03/28/2021: Takes every Thursday   amLODipine (NORVASC) 5 MG tablet TAKE 1 TABLET BY MOUTH EVERY DAY    butalbital-acetaminophen-caffeine (FIORICET) 50-325-40 MG tablet Take one tablet by mouth once daily, as needed, for severe headache    folic acid (FOLVITE) 1 MG tablet TAKE 1 TABLET BY MOUTH EVERY DAY    hydrochlorothiazide (MICROZIDE) 12.5 MG capsule TAKE 1 CAPSULE BY MOUTH EVERY DAY    KLOR-CON M20 20 MEQ tablet TAKE 1 TABLET BY MOUTH EVERY DAY    methotrexate (RHEUMATREX) 2.5 MG tablet TAKE 3 TABLETS BY MOUTH EACH WEEK ON THE SAME DAY. 03/28/2021: Takes every Thursday   methylPREDNISolone (MEDROL DOSEPAK) 4 MG TBPK tablet Take as package instructions.    montelukast (SINGULAIR) 10  MG tablet Take 1 tablet (10 mg total) by mouth at bedtime.    predniSONE (DELTASONE) 1 MG tablet TAKE 4 TABLETS (4 MG TOTAL) BY MOUTH DAILY WITH BREAKFAST    promethazine-dextromethorphan (PROMETHAZINE-DM) 6.25-15 MG/5ML syrup Take 5 mLs by mouth 4 (four) times daily as needed for cough.    Propylene Glycol (SYSTANE BALANCE OP) Place 1 drop into both eyes daily as  needed (for dry eyes).     rosuvastatin (CRESTOR) 5 MG tablet TAKE 1 TABLET BY MOUTH EVERY OTHER DAY    No facility-administered encounter medications on file as of 05/27/2021.    Patient Active Problem List   Diagnosis Date Noted   Age related osteoporosis 02/07/2021   Acute cystitis without hematuria 10/27/2020   Mass of left lower leg 10/24/2020   Headache 08/26/2020   Prediabetes 10/28/2019   H/o Chronic heart failure with preserved ejection fraction (HFpEF) ([place]) 49/70/2637   Umbilical hernia without obstruction and without gangrene 12/10/2018   Fibroids 05/08/2016   Spondylosis of lumbar region without myelopathy or radiculopathy 12/24/2015   Solitary bone cyst of right shoulder 05/28/2015   Right knee pain 05/28/2015   Osteoarthritis of both shoulders 05/28/2015   Overweight (BMI 25.0-29.9) 04/28/2014   Ganglion cyst of wrist 85/88/5027   Systolic murmur 74/12/8784   Diastolic dysfunction 76/72/0947   Hypercalcemia 12/14/2010   Seasonal allergies 08/28/2010   Herpes simplex virus (HSV) infection 09/16/2008   Rheumatoid arthritis with rheumatoid factor of multiple sites without organ or systems involvement ([place]) 09/16/2008   Hyperlipidemia LDL goal <100 05/23/2008   Essential hypertension, benign 05/23/2008    Conditions to be addressed/monitored:HTN and HLD  Care Plan : RN Care Manager Plan of Care  Updates made by Kassie Mends, RN since 05/27/2021 12:00 AM     Problem: No plan of care established for management of chronic disease state  (HTN, HLD)   Priority: High     Long-Range Goal: Development of plan of care for chronic disease management  (HTN, HLD)   Start Date: 05/27/2021  Expected End Date: 11/23/2021  Priority: High  Note:   Current Barriers:  Knowledge Deficits related to plan of care for management of HTN and HLD  Chronic Disease Management support and education needs related to HTN and HLD Spoke with patient's granddaughter Stacy Meyers and  patient, pt reports has grandson lives with her and she has assistance of family members if needed, pt has blood pressure cuff but does not check blood pressure, pt reports she tries to eat healthy, does " a little" exercise, reports her bathroom is currently being remodeled due to mold and mildew, reports she has all medications and taking as prescribed and family assists/ provides oversight with medications as needed.  RNCM Clinical Goal(s):  Patient will verbalize understanding of plan for management of HTN and HLD as evidenced by patient report, review of EHR and  through collaboration with RN Care manager, provider, and care team.   Interventions: 1:1 collaboration with primary care provider regarding development and update of comprehensive plan of care as evidenced by provider attestation and co-signature Inter-disciplinary care team collaboration (see longitudinal plan of care) Evaluation of current treatment plan related to  self management and patient's adherence to plan as established by provider   Hyperlipidemia:  (Status: New goal. Goal on Track (progressing): YES.) Long Term Goal  Lab Results  Component Value Date   CHOL 149 03/13/2021   HDL 60 03/13/2021   LDLCALC 68 03/13/2021   TRIG 120  03/13/2021   CHOLHDL 2.5 03/13/2021     Medication review performed; medication list updated in electronic medical record.  Provider established cholesterol goals reviewed; Counseled on importance of regular laboratory monitoring as prescribed; Provided HLD educational materials; Reviewed role and benefits of statin for ASCVD risk reduction; Reviewed importance of limiting foods high in cholesterol; Reviewed exercise goals and target of 150 minutes per week; Screening for signs and symptoms of depression related to chronic disease state;  Assessed social determinant of health barriers;  Education mailed- heart healthy diet  Hypertension Interventions:  (Status:  New goal. and Goal on  track:  Yes.) Long Term Goal Last practice recorded BP readings:  BP Readings from Last 3 Encounters:  05/26/21 138/78  03/13/21 120/80  01/15/21 109/63  Most recent eGFR/CrCl:  Lab Results  Component Value Date   EGFR 59 (L) 03/13/2021    No components found for: CRCL  Evaluation of current treatment plan related to hypertension self management and patient's adherence to plan as established by provider Reviewed medications with patient and discussed importance of compliance Counseled on the importance of exercise goals with target of 150 minutes per week Discussed plans with patient for ongoing care management follow up and provided patient with direct contact information for care management team Discussed complications of poorly controlled blood pressure such as heart disease, stroke, circulatory complications, vision complications, kidney impairment, sexual dysfunction Screening for signs and symptoms of depression related to chronic disease state  Assessed social determinant of health barriers Education mailed- low sodium diet RN asked patient to check blood pressure at least once weekly and record   Patient Goals/Self-Care Activities: Take medications as prescribed   Attend all scheduled provider appointments Call pharmacy for medication refills 3-7 days in advance of running out of medications Attend church or other social activities Call provider office for new concerns or questions  check blood pressure weekly choose a place to take my blood pressure (home, clinic or office, retail store) write blood pressure results in a log or diary keep a blood pressure log take blood pressure log to all doctor appointments keep all doctor appointments take medications for blood pressure exactly as prescribed eat more whole grains, fruits and vegetables, lean meats and healthy fats - take all medications exactly as prescribed - call doctor with any symptoms you believe are related to  your medicine - call doctor when you experience any new symptoms - go to all doctor appointments as scheduled - adhere to prescribed diet: heart healthy and low sodium Look over education mailed- heart healthy and low sodium diet Please check blood pressure at least weekly and record       Plan:Telephone follow up appointment with care management team member scheduled for:  07/31/21  Jacqlyn Larsen [facility], BSN RN Case Manager [facility] [phone]

## 2021-05-27 NOTE — Patient Instructions (Addendum)
Visit Information   Thank you for taking time to visit with me today. Please don't hesitate to contact me if I can be of assistance to you before our next scheduled telephone appointment.  Following are the goals we discussed today:  Take medications as prescribed   Attend all scheduled provider appointments Call pharmacy for medication refills 3-7 days in advance of running out of medications Attend church or other social activities Call provider office for new concerns or questions  check blood pressure weekly choose a place to take my blood pressure (home, clinic or office, retail store) write blood pressure results in a log or diary keep a blood pressure log take blood pressure log to all doctor appointments keep all doctor appointments take medications for blood pressure exactly as prescribed eat more whole grains, fruits and vegetables, lean meats and healthy fats take all medications exactly as prescribed call doctor with any symptoms you believe are related to your medicine call doctor when you experience any new symptoms go to all doctor appointments as scheduled adhere to prescribed diet: heart healthy and low sodium Look over education mailed- heart healthy and low sodium diet Please check blood pressure at least weekly and record  Our next appointment is by telephone on 07/31/21 at 215 pm  Please call the care guide team at [phone] if you need to cancel or reschedule your appointment.   If you are experiencing a Mental Health or [place] or need someone to talk to, please call the Suicide and Crisis Lifeline: 988 call the [place] National Suicide Prevention Lifeline: 205 784 2115 or TTY: 510-678-5061 TTY 361-133-8663) to talk to a trained counselor call 1-800-273-TALK (toll free, 24 hour hotline) go to [facility] [phone]) call the [place]: [phone] call  911   Following is a copy of your full care plan:  Care Plan : RN Care Manager Plan of Care  Updates made by Kassie Mends, RN since 05/27/2021 12:00 AM     Problem: No plan of care established for management of chronic disease state  (HTN, HLD)   Priority: High     Long-Range Goal: Development of plan of care for chronic disease management  (HTN, HLD)   Start Date: 05/27/2021  Expected End Date: 11/23/2021  Priority: High  Note:   Current Barriers:  Knowledge Deficits related to plan of care for management of HTN and HLD  Chronic Disease Management support and education needs related to HTN and HLD Spoke with patient's granddaughter [name] and patient, pt reports has grandson lives with her and she has assistance of family members if needed, pt has blood pressure cuff but does not check blood pressure, pt reports she tries to eat healthy, does " a little" exercise, reports her bathroom is currently being remodeled due to mold and mildew, reports she has all medications and taking as prescribed and family assists/ provides oversight with medications as needed.  RNCM Clinical Goal(s):  Patient will verbalize understanding of plan for management of HTN and HLD as evidenced by patient report, review of EHR and  through collaboration with RN Care manager, provider, and care team.   Interventions: 1:1 collaboration with primary care provider regarding development and update of comprehensive plan of care as evidenced by provider attestation and co-signature Inter-disciplinary care team collaboration (see longitudinal plan of care) Evaluation of current treatment plan related to  self management and patient's adherence to plan as established by  provider   Hyperlipidemia:  (Status: New goal. Goal on Track (progressing): YES.) Long Term Goal  Lab Results  Component Value Date   CHOL 149 03/13/2021   HDL 60 03/13/2021   LDLCALC 68 03/13/2021   TRIG 120 03/13/2021   CHOLHDL 2.5  03/13/2021     Medication review performed; medication list updated in electronic medical record.  Provider established cholesterol goals reviewed; Counseled on importance of regular laboratory monitoring as prescribed; Provided HLD educational materials; Reviewed role and benefits of statin for ASCVD risk reduction; Reviewed importance of limiting foods high in cholesterol; Reviewed exercise goals and target of 150 minutes per week; Screening for signs and symptoms of depression related to chronic disease state;  Assessed social determinant of health barriers;  Education mailed- heart healthy diet  Hypertension Interventions:  (Status:  New goal. and Goal on track:  Yes.) Long Term Goal Last practice recorded BP readings:  BP Readings from Last 3 Encounters:  05/26/21 138/78  03/13/21 120/80  01/15/21 109/63  Most recent eGFR/CrCl:  Lab Results  Component Value Date   EGFR 59 (L) 03/13/2021    No components found for: CRCL  Evaluation of current treatment plan related to hypertension self management and patient's adherence to plan as established by provider Reviewed medications with patient and discussed importance of compliance Counseled on the importance of exercise goals with target of 150 minutes per week Discussed plans with patient for ongoing care management follow up and provided patient with direct contact information for care management team Discussed complications of poorly controlled blood pressure such as heart disease, stroke, circulatory complications, vision complications, kidney impairment, sexual dysfunction Screening for signs and symptoms of depression related to chronic disease state  Assessed social determinant of health barriers Education mailed- low sodium diet RN asked patient to check blood pressure at least once weekly and record   Patient Goals/Self-Care Activities: Take medications as prescribed   Attend all scheduled provider appointments Call  pharmacy for medication refills 3-7 days in advance of running out of medications Attend church or other social activities Call provider office for new concerns or questions  check blood pressure weekly choose a place to take my blood pressure (home, clinic or office, retail store) write blood pressure results in a log or diary keep a blood pressure log take blood pressure log to all doctor appointments keep all doctor appointments take medications for blood pressure exactly as prescribed eat more whole grains, fruits and vegetables, lean meats and healthy fats - take all medications exactly as prescribed - call doctor with any symptoms you believe are related to your medicine - call doctor when you experience any new symptoms - go to all doctor appointments as scheduled - adhere to prescribed diet: heart healthy and low sodium Look over education mailed- heart healthy and low sodium diet Please check blood pressure at least weekly and record       Consent to CCM Services: Ms. Rockholt was given information about Chronic Care Management services including:  CCM service includes personalized support from designated clinical staff supervised by her physician, including individualized plan of care and coordination with other care providers 24/7 contact phone numbers for assistance for urgent and routine care needs. Service will only be billed when office clinical staff spend 20 minutes or more in a month to coordinate care. Only one practitioner may furnish and bill the service in a calendar month. The patient may stop CCM services at any time (effective at the end of  the month) by phone call to the office staff. The patient will be responsible for cost sharing (co-pay) of up to 20% of the service fee (after annual deductible is met).  Patient agreed to services and verbal consent obtained.   The patient verbalized understanding of instructions, educational materials, and care plan  provided today and agreed to receive a mailed copy of patient instructions, educational materials, and care plan.   Heart-Healthy Eating Plan Many factors influence your heart (coronary) health, including eating and exercise habits. Coronary risk increases with abnormal blood fat (lipid) levels. Heart-healthy meal planning includes limiting unhealthy fats, increasing healthy fats, and making other diet and lifestyle changes. What is my plan? Your health care provider may recommend that you: Limit your fat intake to _________% or less of your total calories each day. Limit your saturated fat intake to _________% or less of your total calories each day. Limit the amount of cholesterol in your diet to less than _________ mg per day. What are tips for following this plan? Cooking Cook foods using methods other than frying. Baking, boiling, grilling, and broiling are all good options. Other ways to reduce fat include: Removing the skin from poultry. Removing all visible fats from meats. Steaming vegetables in water or broth. Meal planning  At meals, imagine dividing your plate into fourths: Fill one-half of your plate with vegetables and green salads. Fill one-fourth of your plate with whole grains. Fill one-fourth of your plate with lean protein foods. Eat 4-5 servings of vegetables per day. One serving equals 1 cup raw or cooked vegetable, or 2 cups raw leafy greens. Eat 4-5 servings of fruit per day. One serving equals 1 medium whole fruit,  cup dried fruit,  cup fresh, frozen, or canned fruit, or  cup 100% fruit juice. Eat more foods that contain soluble fiber. Examples include apples, broccoli, carrots, beans, peas, and barley. Aim to get 25-30 g of fiber per day. Increase your consumption of legumes, nuts, and seeds to 4-5 servings per week. One serving of dried beans or legumes equals  cup cooked, 1 serving of nuts is  cup, and 1 serving of seeds equals 1 tablespoon. Fats Choose  healthy fats more often. Choose monounsaturated and polyunsaturated fats, such as olive and canola oils, flaxseeds, walnuts, almonds, and seeds. Eat more omega-3 fats. Choose salmon, mackerel, sardines, tuna, flaxseed oil, and ground flaxseeds. Aim to eat fish at least 2 times each week. Check food labels carefully to identify foods with trans fats or high amounts of saturated fat. Limit saturated fats. These are found in animal products, such as meats, butter, and cream. Plant sources of saturated fats include palm oil, palm kernel oil, and coconut oil. Avoid foods with partially hydrogenated oils in them. These contain trans fats. Examples are stick margarine, some tub margarines, cookies, crackers, and other baked goods. Avoid fried foods. General information Eat more home-cooked food and less restaurant, buffet, and fast food. Limit or avoid alcohol. Limit foods that are high in starch and sugar. Lose weight if you are overweight. Losing just 5-10% of your body weight can help your overall health and prevent diseases such as diabetes and heart disease. Monitor your salt (sodium) intake, especially if you have high blood pressure. Talk with your health care provider about your sodium intake. Try to incorporate more vegetarian meals weekly. What foods can I eat? Fruits All fresh, canned (in natural juice), or frozen fruits. Vegetables Fresh or frozen vegetables (raw, steamed, roasted, or grilled). [place]  salads. Grains Most grains. Choose whole wheat and whole grains most of the time. Rice and pasta, including brown rice and pastas made with whole wheat. Meats and other proteins Lean, well-trimmed beef, veal, pork, and lamb. Chicken and [place] without skin. All fish and shellfish. Wild duck, rabbit, pheasant, and venison. Egg whites or low-cholesterol egg substitutes. Dried beans, peas, lentils, and tofu. Seeds and most nuts. Dairy Low-fat or nonfat cheeses, including ricotta and mozzarella.  Skim or 1% milk (liquid, powdered, or evaporated). Buttermilk made with low-fat milk. Nonfat or low-fat yogurt. Fats and oils Non-hydrogenated (trans-free) margarines. Vegetable oils, including soybean, sesame, sunflower, olive, peanut, safflower, corn, canola, and cottonseed. Salad dressings or mayonnaise made with a vegetable oil. Beverages Water (mineral or sparkling). Coffee and tea. Diet carbonated beverages. Sweets and desserts Sherbet, gelatin, and fruit ice. Small amounts of dark chocolate. Limit all sweets and desserts. Seasonings and condiments All seasonings and condiments. The items listed above may not be a complete list of foods and beverages you can eat. Contact a dietitian for more options. What foods are not recommended? Fruits Canned fruit in heavy syrup. Fruit in cream or butter sauce. Fried fruit. Limit coconut. Vegetables Vegetables cooked in cheese, cream, or butter sauce. Fried vegetables. Grains Breads made with saturated or trans fats, oils, or whole milk. Croissants. Sweet rolls. Donuts. High-fat crackers, such as cheese crackers. Meats and other proteins Fatty meats, such as hot dogs, ribs, sausage, bacon, rib-eye roast or steak. High-fat deli meats, such as salami and bologna. Caviar. Domestic duck and goose. Organ meats, such as liver. Dairy Cream, sour cream, cream cheese, and creamed cottage cheese. Whole-milk cheeses. Whole or 2% milk (liquid, evaporated, or condensed). Whole buttermilk. Cream sauce or high-fat cheese sauce. Whole-milk yogurt. Fats and oils Meat fat, or shortening. Cocoa butter, hydrogenated oils, palm oil, coconut oil, palm kernel oil. Solid fats and shortenings, including bacon fat, salt pork, lard, and butter. Nondairy cream substitutes. Salad dressings with cheese or sour cream. Beverages Regular sodas and any drinks with added sugar. Sweets and desserts Frosting. Pudding. Cookies. Cakes. Pies. Milk chocolate or white chocolate. Buttered  syrups. Full-fat ice cream or ice cream drinks. The items listed above may not be a complete list of foods and beverages to avoid. Contact a dietitian for more information. Summary Heart-healthy meal planning includes limiting unhealthy fats, increasing healthy fats, and making other diet and lifestyle changes. Lose weight if you are overweight. Losing just 5-10% of your body weight can help your overall health and prevent diseases such as diabetes and heart disease. Focus on eating a balance of foods, including fruits and vegetables, low-fat or nonfat dairy, lean protein, nuts and legumes, whole grains, and heart-healthy oils and fats. This information is not intended to replace advice given to you by your health care provider. Make sure you discuss any questions you have with your health care provider. Document Revised: 05/02/2020 Document Reviewed: 05/02/2020 Elsevier Patient Education  2022 [place]. Low-Sodium Eating Plan Sodium, which is an element that makes up salt, helps you maintain a healthy balance of fluids in your body. Too much sodium can increase your blood pressure and cause fluid and waste to be held in your body. Your health care provider or dietitian may recommend following this plan if you have high blood pressure (hypertension), kidney disease, liver disease, or heart failure. Eating less sodium can help lower your blood pressure, reduce swelling, and protect your heart, liver, and kidneys. What are tips for following this  plan? Reading food labels The Nutrition Facts label lists the amount of sodium in one serving of the food. If you eat more than one serving, you must multiply the listed amount of sodium by the number of servings. Choose foods with less than 140 mg of sodium per serving. Avoid foods with 300 mg of sodium or more per serving. Shopping  Look for lower-sodium products, often labeled as "low-sodium" or "no salt added." Always check the sodium content,  even if foods are labeled as "unsalted" or "no salt added." Buy fresh foods. Avoid canned foods and pre-made or frozen meals. Avoid canned, cured, or processed meats. Buy breads that have less than 80 mg of sodium per slice. Cooking  Eat more home-cooked food and less restaurant, buffet, and fast food. Avoid adding salt when cooking. Use salt-free seasonings or herbs instead of table salt or sea salt. Check with your health care provider or pharmacist before using salt substitutes. Cook with plant-based oils, such as canola, sunflower, or olive oil. Meal planning When eating at a restaurant, ask that your food be prepared with less salt or no salt, if possible. Avoid dishes labeled as brined, pickled, cured, smoked, or made with soy sauce, miso, or teriyaki sauce. Avoid foods that contain MSG (monosodium glutamate). MSG is sometimes added to [place] food, bouillon, and some canned foods. Make meals that can be grilled, baked, poached, roasted, or steamed. These are generally made with less sodium. General information Most people on this plan should limit their sodium intake to 1,500-2,000 mg (milligrams) of sodium each day. What foods should I eat? Fruits Fresh, frozen, or canned fruit. Fruit juice. Vegetables Fresh or frozen vegetables. "No salt added" canned vegetables. "No salt added" tomato sauce and paste. Low-sodium or reduced-sodium tomato and vegetable juice. Grains Low-sodium cereals, including oats, puffed wheat and rice, and shredded wheat. Low-sodium crackers. Unsalted rice. Unsalted pasta. Low-sodium bread. Whole-grain breads and whole-grain pasta. Meats and other proteins Fresh or frozen (no salt added) meat, poultry, seafood, and fish. Low-sodium canned tuna and salmon. Unsalted nuts. Dried peas, beans, and lentils without added salt. Unsalted canned beans. Eggs. Unsalted nut butters. Dairy Milk. Soy milk. Cheese that is naturally low in sodium, such as ricotta cheese, fresh  mozzarella, or Swiss cheese. Low-sodium or reduced-sodium cheese. Cream cheese. Yogurt. Seasonings and condiments Fresh and dried herbs and spices. Salt-free seasonings. Low-sodium mustard and ketchup. Sodium-free salad dressing. Sodium-free light mayonnaise. Fresh or refrigerated horseradish. Lemon juice. Vinegar. Other foods Homemade, reduced-sodium, or low-sodium soups. Unsalted popcorn and pretzels. Low-salt or salt-free chips. The items listed above may not be a complete list of foods and beverages you can eat. Contact a dietitian for more information. What foods should I avoid? Vegetables Sauerkraut, pickled vegetables, and relishes. Olives. [place] fries. Onion rings. Regular canned vegetables (not low-sodium or reduced-sodium). Regular canned tomato sauce and paste (not low-sodium or reduced-sodium). Regular tomato and vegetable juice (not low-sodium or reduced-sodium). Frozen vegetables in sauces. Grains Instant hot cereals. Bread stuffing, pancake, and biscuit mixes. Croutons. Seasoned rice or pasta mixes. Noodle soup cups. Boxed or frozen macaroni and cheese. Regular salted crackers. Self-rising flour. Meats and other proteins Meat or fish that is salted, canned, smoked, spiced, or pickled. Precooked or cured meat, such as sausages or meat loaves. [place] Salines. Ham. Pepperoni. Hot dogs. Corned beef. Chipped beef. Salt pork. Jerky. Pickled herring. Anchovies and sardines. Regular canned tuna. Salted nuts. Dairy Processed cheese and cheese spreads. Hard cheeses. Cheese curds. Blue cheese. Feta cheese. String  cheese. Regular cottage cheese. Buttermilk. Canned milk. Fats and oils Salted butter. Regular margarine. Ghee. Bacon fat. Seasonings and condiments Onion salt, garlic salt, seasoned salt, table salt, and sea salt. Canned and packaged gravies. Worcestershire sauce. Tartar sauce. Barbecue sauce. Teriyaki sauce. Soy sauce, including reduced-sodium. Steak sauce. Fish sauce. Oyster sauce. Cocktail  sauce. Horseradish that you find on the shelf. Regular ketchup and mustard. Meat flavorings and tenderizers. Bouillon cubes. Hot sauce. Pre-made or packaged marinades. Pre-made or packaged taco seasonings. Relishes. Regular salad dressings. Salsa. Other foods Salted popcorn and pretzels. Corn chips and puffs. Potato and tortilla chips. Canned or dried soups. Pizza. Frozen entrees and pot pies. The items listed above may not be a complete list of foods and beverages you should avoid. Contact a dietitian for more information. Summary Eating less sodium can help lower your blood pressure, reduce swelling, and protect your heart, liver, and kidneys. Most people on this plan should limit their sodium intake to 1,500-2,000 mg (milligrams) of sodium each day. Canned, boxed, and frozen foods are high in sodium. Restaurant foods, fast foods, and pizza are also very high in sodium. You also get sodium by adding salt to food. Try to cook at home, eat more fresh fruits and vegetables, and eat less fast food and canned, processed, or prepared foods. This information is not intended to replace advice given to you by your health care provider. Make sure you discuss any questions you have with your health care provider. Document Revised: 01/27/2019 Document Reviewed: 11/23/2018 Elsevier Patient Education  [place].   Telephone follow up appointment with care management team member scheduled for:07/31/21

## 2021-06-04 DIAGNOSIS — I10 Essential (primary) hypertension: Secondary | ICD-10-CM

## 2021-06-04 DIAGNOSIS — E785 Hyperlipidemia, unspecified: Secondary | ICD-10-CM

## 2021-06-05 NOTE — Progress Notes (Signed)
Office Visit Note  Patient: Stacy Meyers             Date of Birth: Sep 05, 1932           MRN: [id number]             PCP: Fayrene Helper, MD Referring: Fayrene Helper, MD Visit Date: 06/19/2021 Occupation: '@GUAROCC'$ @  Subjective:  Medication management  History of Present Illness: Stacy Meyers is a 88 y.o. female with history of rheumatoid arthritis and osteoarthritis.  She states she has been tolerating methotrexate 3 tablets p.o. weekly without any side effects.  She also takes prednisone 4 mg p.o. daily.  She denies any joint pain or joint swelling.  She has intermittent stiffness in her joints.  She has difficulty getting dressed due to limited range of motion of her shoulders.  She also has difficulty climbing stairs and getting up from the chair.  She mobilizes with the help of a cane.  She reports intermittent stiffness in her back.  Activities of Daily Living:  Patient reports morning stiffness for 10 minutes.   Patient Denies nocturnal pain.  Difficulty dressing/grooming: Reports Difficulty climbing stairs: Denies Difficulty getting out of chair: Reports Difficulty using hands for taps, buttons, cutlery, and/or writing: Denies  Review of Systems  Constitutional:  Positive for fatigue.  HENT:  Positive for mouth dryness.   Eyes:  Positive for dryness.  Respiratory:  Positive for shortness of breath.   Cardiovascular:  Positive for swelling in legs/feet.  Gastrointestinal:  Negative for constipation.  Endocrine: Positive for excessive thirst and increased urination.  Genitourinary:  Negative for difficulty urinating.  Musculoskeletal:  Positive for joint pain, gait problem, joint pain, muscle weakness, morning stiffness and muscle tenderness. Negative for joint swelling.  Skin:  Negative for rash.  Allergic/Immunologic: Negative for susceptible to infections.  Neurological:  Positive for weakness.  Hematological:  Positive for bruising/bleeding tendency.   Psychiatric/Behavioral:  Positive for sleep disturbance.     PMFS History:  Patient Active Problem List   Diagnosis Date Noted   Age related osteoporosis 02/07/2021   Acute cystitis without hematuria 10/27/2020   Mass of left lower leg 10/24/2020   Headache 08/26/2020   Prediabetes 10/28/2019   H/o Chronic heart failure with preserved ejection fraction (HFpEF) ([place]) 93/81/8299   Umbilical hernia without obstruction and without gangrene 12/10/2018   Fibroids 05/08/2016   Spondylosis of lumbar region without myelopathy or radiculopathy 12/24/2015   Solitary bone cyst of right shoulder 05/28/2015   Right knee pain 05/28/2015   Osteoarthritis of both shoulders 05/28/2015   Overweight (BMI 25.0-29.9) 04/28/2014   Ganglion cyst of wrist 37/16/9678   Systolic murmur 93/81/0175   Diastolic dysfunction 10/29/8525   Hypercalcemia 12/14/2010   Seasonal allergies 08/28/2010   Herpes simplex virus (HSV) infection 09/16/2008   Rheumatoid arthritis with rheumatoid factor of multiple sites without organ or systems involvement ([place]) 09/16/2008   Hyperlipidemia LDL goal <100 05/23/2008   Essential hypertension, benign 05/23/2008    Past Medical History:  Diagnosis Date   Acid reflux    Acute cystitis with hematuria 04/11/2011   Anemia    ARF (acute renal failure) ([place]) 12/14/2010   Arthritis    CKD (chronic kidney disease), stage II    previously seen by nephrologist--- dr Florene Glen,  pt released per lov in epic 09-24-2017   Diabetes mellitus, type II ([place])    Diverticulosis of colon    Essential hypertension, benign    followed by pcp   (  pt had nuclear stress test (in epic under media) dated 05-25-2008 showed normal perfusion w/ no evidence ischemia, ef 83%   Heart disease    History of bradycardia    previously seen by cardiologist, dr branch, pt released per lov 08-11-2018 in epic, bradycardia resolved   History of sepsis    04-03-2019 hospital admission w/ urosepsis due to obstructive  uropathy due to right ureter stone   Hypokalemia    Lipoma of colon    ascending   Lumbar spondylosis    Mixed hyperlipidemia    Nephrolithiasis    per CT 04-03-2019 nonobstructive bilateral stones   Ocular herpes    Osteoarthritis    followed by dr Estanislado Pandy--- hands, shoulders, feet   Osteoporosis    Rheumatoid arthritis(714.0)    rheumotologist-- dr s. Estanislado Pandy---  multiple sites, treated with methotrexate/ prednisone   Right nephrolithiasis-1.4cm Moderate right-sided urinary tract obstruction/hydronephrosis 04/03/2019   Right ureteral calculus    Seasonal allergies    Sepsis ([place]) 04/03/2019   Sepsis secondary to UTI /Nephrolithiasis with obstructive uropathy 04/03/2019   SUI (stress urinary incontinence, female)    Uterine fibroid     Family History  Problem Relation Age of Onset   Stroke Mother    Cancer Father    Diabetes Sister    Diabetes Sister    Kidney failure Brother    Kidney failure Brother    Pancreatic cancer Sister    Kidney Stones Sister        Only one Kidney   Arthritis Sister    Heart disease Daughter    Past Surgical History:  Procedure Laterality Date   BUNIONECTOMY Right 2012   CATARACT EXTRACTION W/ INTRAOCULAR LENS  IMPLANT, BILATERAL  2009   CHOLECYSTECTOMY  12/17/2010   Procedure: LAPAROSCOPIC CHOLECYSTECTOMY;  Surgeon: Donato Heinz;  Location: [facility];  Service: General;  Laterality: N/A;   COLONOSCOPY  05/22/2011   Procedure: COLONOSCOPY;  Surgeon: Rogene Houston, MD;  Location: [facility];  Service: Endoscopy;  Laterality: N/A;  [place], URETEROSCOPY AND STENT PLACEMENT Right 05/09/2019   Procedure: CYSTOSCOPY WITH RETROGRADE PYELOGRAM, URETEROSCOPY AND STENT PLACEMENT;  Surgeon: Robley Fries, MD;  Location: [facility];  Service: Urology;  Laterality: Right;  1 HR   CYSTOSCOPY WITH STENT PLACEMENT Right 04/03/2019   Procedure: CYSTOSCOPY, RIGHT RETROGRADE PYELOGRAM  WITH STENT  PLACEMENT;  Surgeon: Robley Fries, MD;  Location: [facility];  Service: Urology;  Laterality: Right;   EXCISION BONE CYST Right 2010   RIGHT SHOULDER   GANGLION CYST EXCISION Left 06/02/2012   Procedure: Excision ganglion left dorsal wrist;  Surgeon: Sanjuana Kava, MD;  Location: [facility];  Service: Orthopedics;  Laterality: Left;   HOLMIUM LASER APPLICATION Right 03/12/9022   Procedure: HOLMIUM LASER APPLICATION;  Surgeon: Robley Fries, MD;  Location: [facility];  Service: Urology;  Laterality: Right;   kidney stones     LUMBAR MICRODISCECTOMY  07-16-2000   '@[facility]'$    L 2  -3   POSTERIOR LAMINECTOMY / DECOMPRESSION LUMBAR SPINE  06-26-2009   '@[facility]'$    w/ LATERAL MASS FUSION , L3 -- 5   SHOULDER ARTHROSCOPY WITH DISTAL CLAVICLE RESECTION Right 01-10-2009   '@[facility]'$    w/ DEBRIDEMENT AND EXCISION RECURRENT GANGLION CYST   Social History   Social History Narrative   Husband passed away 04-19-2016. It has been financially harder to pay the bills since his passing, but she is  making it. Has grandchildren around which makes it easier.    Immunization History  Administered Date(s) Administered   Fluad Quad(high Dose 65+) 10/19/2019   Influenza Split 11/07/2013   Influenza Whole 10/28/2009, 09/17/2010   Influenza, High Dose Seasonal PF 09/29/2016, 09/15/2018   Influenza,inj,Quad PF,6+ Mos 09/03/2014   Influenza-Unspecified 10/05/2013, 09/29/2016   Moderna Sars-Covid-2 Vaccination 01/17/2019, 02/27/2019, 10/30/2019   PPD Test 05/14/2014, 05/28/2015   Pneumococcal Conjugate-13 08/03/2013   Pneumococcal Polysaccharide-23 08/28/2009   Tdap 04/29/2010     Objective: Vital Signs: BP 113/62 (BP Location: Left Arm, Patient Position: Sitting, Cuff Size: Normal)   Pulse (!) 47   Resp 16   Ht '4\' 11"'$  (1.499 m)   Wt 128 lb (58.1 kg)   BMI 25.85 kg/m    Physical Exam Vitals and nursing note reviewed.  Constitutional:      Appearance: She is well-developed.  HENT:     Head:  Normocephalic and atraumatic.  Eyes:     Conjunctiva/sclera: Conjunctivae normal.  Cardiovascular:     Rate and Rhythm: Normal rate and regular rhythm.     Heart sounds: Normal heart sounds.  Pulmonary:     Effort: Pulmonary effort is normal.     Breath sounds: Normal breath sounds.  Abdominal:     General: Bowel sounds are normal.     Palpations: Abdomen is soft.  Musculoskeletal:     Cervical back: Normal range of motion.  Lymphadenopathy:     Cervical: No cervical adenopathy.  Skin:    General: Skin is warm and dry.     Capillary Refill: Capillary refill takes less than 2 seconds.  Neurological:     Mental Status: She is alert and oriented to person, place, and time.  Psychiatric:        Behavior: Behavior normal.      Musculoskeletal Exam: She had good range of motion of the cervical spine.  Bilateral shoulder joints abduction was limited to 30 degrees bilaterally.  Elbow joints and wrist joints with good range of motion.  She had bilateral CMC PIP and DIP thickening.  She had bilateral MCP thickening with no synovitis.  Hip joints were difficult to assess in the sitting position.  Good range of motion of bilateral knee joints without any warmth swelling or effusion.  She had no tenderness over ankles or MTPs.  CDAI Exam: CDAI Score: 0.4  Patient Global: 2 mm; Provider Global: 2 mm Swollen: 0 ; Tender: 0  Joint Exam 06/19/2021   No joint exam has been documented for this visit   There is currently no information documented on the homunculus. Go to the Rheumatology activity and complete the homunculus joint exam.  Investigation: No additional findings.  Imaging: No results found.  Recent Labs: Lab Results  Component Value Date   WBC 6.6 01/15/2021   HGB 13.5 01/15/2021   PLT 249 01/15/2021   NA 139 03/13/2021   K 3.4 (L) 03/13/2021   CL 100 03/13/2021   CO2 26 03/13/2021   GLUCOSE 93 03/13/2021   BUN 23 03/13/2021   CREATININE 0.94 03/13/2021   BILITOT 0.7  03/13/2021   ALKPHOS 67 03/13/2021   AST 16 03/13/2021   ALT 8 03/13/2021   PROT 6.5 03/13/2021   ALBUMIN 4.3 03/13/2021   CALCIUM 10.5 (H) 03/13/2021   GFRAA 70 03/12/2020    Speciality Comments: No specialty comments available.  Procedures:  No procedures performed Allergies: Aspirin and Sudafed [pseudoephedrine hcl]   Assessment / Plan:  Visit Diagnoses: Rheumatoid arthritis with rheumatoid factor of multiple sites without organ or systems involvement (Stacy Meyers) - longstanding history of rheumatoid arthritis.  She denies any discomfort in her joints.  There was no synovitis on the examination today.  She has been tolerating methotrexate 3 tablets p.o. weekly and prednisone for milligrams daily without any side effects.  She has been taking her medications on a regular basis.  High risk medication use - Methotrexate 3 tablets by mouth every 7 days, folic acid 1 mg daily, and prednisone 1 mg 4 tablets daily. -Labs obtained on 2 March 13, 2021 were reviewed.  CMP was normal except for elevated calcium.  Vitamin D was normal at 35.  TSH was normal.  We will check labs today and then every 3 months to monitor for drug toxicity.  Plan: CBC with Differential/Platelet, COMPLETE METABOLIC PANEL WITH GFR.  Information regarding immunization was placed in the AVS.  She was advised to stop methotrexate if she develops an infection and resume after the infection resolves.  Primary osteoarthritis of both hands -she has severe osteoarthritis in her hands with bilateral CMC PIP and DIP thickening.  She also had bilateral MCP thickening with no synovitis.  X-rays are consistent with severe osteoarthritis and rheumatoid arthritis overlap.  Joint protection was discussed.  Primary osteoarthritis of both shoulders-she has chronic discomfort in her shoulders.  Shoulder joint abduction was limited to 30 degrees bilaterally.  She has difficulty getting dressed due to limitation of range of motion of her  shoulders.  Primary osteoarthritis of both knees-she denies any discomfort in her knee joints.  She mobilizes with the help of a cane.  Primary osteoarthritis of both feet -she denies discomfort in her feet today.  She had good range of motion of the ankle joints and had no tenderness over MTPs.  X-rays are consistent with severe rheumatoid arthritis and osteoarthritis overlap.  Spondylosis of lumbar region without myelopathy or radiculopathy-she gives history of intermittent pain.  Age-related osteoporosis without current pathological fracture - DEXA ordered on 07/31/19 by Dr. Moshe Cipro: Left femur Neck:  Osteopenia T-score -2.0, BMD 0.764 g/cm2.  She is on alendronate 70 mg p.o. weekly.  Essential hypertension-blood pressure was normal today.  Type 2 diabetes, diet controlled ([facility])  Systolic murmur  Orders: Orders Placed This Encounter  Procedures   CBC with Differential/Platelet   COMPLETE METABOLIC PANEL WITH GFR   No orders of the defined types were placed in this encounter.   Follow-Up Instructions: Return in about 5 months (around 11/19/2021) for Rheumatoid arthritis, Osteoarthritis.   Bo Merino, MD  Note - This record has been created using Editor, commissioning.  Chart creation errors have been sought, but may not always  have been located. Such creation errors do not reflect on  the standard of medical care.

## 2021-06-17 ENCOUNTER — Other Ambulatory Visit: Payer: Self-pay | Admitting: Internal Medicine

## 2021-06-17 DIAGNOSIS — J209 Acute bronchitis, unspecified: Secondary | ICD-10-CM

## 2021-06-19 ENCOUNTER — Ambulatory Visit: Payer: Medicare PPO | Admitting: Rheumatology

## 2021-06-19 ENCOUNTER — Encounter: Payer: Self-pay | Admitting: Rheumatology

## 2021-06-19 VITALS — BP 113/62 | HR 47 | Resp 16 | Ht 59.0 in | Wt 128.0 lb

## 2021-06-19 DIAGNOSIS — I10 Essential (primary) hypertension: Secondary | ICD-10-CM | POA: Diagnosis not present

## 2021-06-19 DIAGNOSIS — M47816 Spondylosis without myelopathy or radiculopathy, lumbar region: Secondary | ICD-10-CM

## 2021-06-19 DIAGNOSIS — M19072 Primary osteoarthritis, left ankle and foot: Secondary | ICD-10-CM

## 2021-06-19 DIAGNOSIS — M19071 Primary osteoarthritis, right ankle and foot: Secondary | ICD-10-CM | POA: Diagnosis not present

## 2021-06-19 DIAGNOSIS — N3000 Acute cystitis without hematuria: Secondary | ICD-10-CM

## 2021-06-19 DIAGNOSIS — Z79899 Other long term (current) drug therapy: Secondary | ICD-10-CM

## 2021-06-19 DIAGNOSIS — M19042 Primary osteoarthritis, left hand: Secondary | ICD-10-CM

## 2021-06-19 DIAGNOSIS — M19012 Primary osteoarthritis, left shoulder: Secondary | ICD-10-CM

## 2021-06-19 DIAGNOSIS — M170 Bilateral primary osteoarthritis of knee: Secondary | ICD-10-CM | POA: Diagnosis not present

## 2021-06-19 DIAGNOSIS — M810 Age-related osteoporosis without current pathological fracture: Secondary | ICD-10-CM | POA: Diagnosis not present

## 2021-06-19 DIAGNOSIS — M19011 Primary osteoarthritis, right shoulder: Secondary | ICD-10-CM | POA: Diagnosis not present

## 2021-06-19 DIAGNOSIS — M0579 Rheumatoid arthritis with rheumatoid factor of multiple sites without organ or systems involvement: Secondary | ICD-10-CM | POA: Diagnosis not present

## 2021-06-19 DIAGNOSIS — M19041 Primary osteoarthritis, right hand: Secondary | ICD-10-CM | POA: Diagnosis not present

## 2021-06-19 DIAGNOSIS — R011 Cardiac murmur, unspecified: Secondary | ICD-10-CM

## 2021-06-19 DIAGNOSIS — E119 Type 2 diabetes mellitus without complications: Secondary | ICD-10-CM

## 2021-06-19 NOTE — Patient Instructions (Signed)
Standing Labs We placed an order today for your standing lab work.   Please have your standing labs drawn in September and every 3 months  If possible, please have your labs drawn 2 weeks prior to your appointment so that the provider can discuss your results at your appointment.  Please note that you may see your imaging and lab results in MyChart before we have reviewed them. We may be awaiting multiple results to interpret others before contacting you. Please allow our office up to 72 hours to thoroughly review all of the results before contacting the office for clarification of your results.  We have open lab daily: Monday through Thursday from 1:30-4:30 PM and Friday from 1:30-4:00 PM at the office of Dr. Mansoor Hillyard, [facility].   Please be advised, all patients with office appointments requiring lab work will take precedent over walk-in lab work.  If possible, please come for your lab work on Monday and Friday afternoons, as you may experience shorter wait times. The office is located at [address], [address], [place], [place] [address] No appointment is necessary.   Labs are drawn by [facility]. Please bring your co-pay at the time of your lab draw.  You may receive a bill from [facility] for your lab work.  Please note if you are on Hydroxychloroquine and and an order has been placed for a Hydroxychloroquine level, you will need to have it drawn 4 hours or more after your last dose.  If you wish to have your labs drawn at another location, please call the office 24 hours in advance to send orders.  If you have any questions regarding directions or hours of operation,  please call [phone].   As a reminder, please drink plenty of water prior to coming for your lab work. Thanks!   Vaccines You are taking a medication(s) that can suppress your immune system.  The following immunizations are recommended: Flu annually Covid-19  Td/Tdap (tetanus, diphtheria,  pertussis) every 10 years Pneumonia (Prevnar 15 then Pneumovax 23 at least 1 year apart.  Alternatively, can take Prevnar 20 without needing additional dose) Shingrix: 2 doses from 4 weeks to 6 months apart  Please check with your PCP to make sure you are up to date.   If you have signs or symptoms of an infection or start antibiotics: First, call your PCP for workup of your infection. Hold your medication through the infection, until you complete your antibiotics, and until symptoms resolve if you take the following: Injectable medication (Actemra, Benlysta, Cimzia, Cosentyx, Enbrel, Humira, Kevzara, Orencia, Remicade, Simponi, Stelara, Taltz, Tremfya) Methotrexate Leflunomide (Arava) Mycophenolate (Cellcept) Xeljanz, Olumiant, or Rinvoq  

## 2021-06-20 LAB — COMPLETE METABOLIC PANEL WITH GFR
AG Ratio: 1.7 (calc) (ref 1.0–2.5)
ALT: 7 U/L (ref 6–29)
AST: 14 U/L (ref 10–35)
Albumin: 4.1 g/dL (ref 3.6–5.1)
Alkaline phosphatase (APISO): 53 U/L (ref 37–153)
BUN: 21 mg/dL (ref 7–25)
CO2: 29 mmol/L (ref 20–32)
Calcium: 10.7 mg/dL — ABNORMAL HIGH (ref 8.6–10.4)
Chloride: 106 mmol/L (ref 98–110)
Creat: 0.89 mg/dL (ref 0.60–0.95)
Globulin: 2.4 g/dL (calc) (ref 1.9–3.7)
Glucose, Bld: 97 mg/dL (ref 65–99)
Potassium: 4 mmol/L (ref 3.5–5.3)
Sodium: 143 mmol/L (ref 135–146)
Total Bilirubin: 0.7 mg/dL (ref 0.2–1.2)
Total Protein: 6.5 g/dL (ref 6.1–8.1)
eGFR: 63 mL/min/{1.73_m2} (ref >=60)

## 2021-06-20 LAB — CBC WITH DIFFERENTIAL/PLATELET
Absolute Monocytes: 488 cells/uL (ref 200–950)
Basophils Absolute: 37 cells/uL (ref 0–200)
Basophils Relative: 0.7 %
Eosinophils Absolute: 111 cells/uL (ref 15–500)
Eosinophils Relative: 2.1 %
HCT: 39.9 % (ref 35.0–45.0)
Hemoglobin: 13.4 g/dL (ref 11.7–15.5)
Lymphs Abs: 2184 cells/uL (ref 850–3900)
MCH: 31.3 pg (ref 27.0–33.0)
MCHC: 33.6 g/dL (ref 32.0–36.0)
MCV: 93.2 fL (ref 80.0–100.0)
MPV: 9.1 fL (ref 7.5–12.5)
Monocytes Relative: 9.2 %
Neutro Abs: 2480 cells/uL (ref 1500–7800)
Neutrophils Relative %: 46.8 %
Platelets: 206 10*3/uL (ref 140–400)
RBC: 4.28 10*6/uL (ref 3.80–5.10)
RDW: 13.7 % (ref 11.0–15.0)
Total Lymphocyte: 41.2 %
WBC: 5.3 10*3/uL (ref 3.8–10.8)

## 2021-06-20 NOTE — Progress Notes (Signed)
CBC is normal.  Calcium is elevated.  Please advise patient to avoid calcium supplement.

## 2021-07-03 ENCOUNTER — Other Ambulatory Visit: Payer: Self-pay | Admitting: Family Medicine

## 2021-07-03 DIAGNOSIS — I10 Essential (primary) hypertension: Secondary | ICD-10-CM

## 2021-07-15 ENCOUNTER — Other Ambulatory Visit: Payer: Self-pay | Admitting: Family Medicine

## 2021-07-15 ENCOUNTER — Other Ambulatory Visit: Payer: Self-pay | Admitting: Physician Assistant

## 2021-07-15 NOTE — Telephone Encounter (Signed)
Next Visit: Due November 2023. Message sent to the front to schedule.   Last Visit: 06/19/2021  Last Fill: 03/17/2021  Dx: Rheumatoid arthritis with rheumatoid factor of multiple sites without organ or systems involvement   Current Dose per office note on 06/19/2021: prednisone 1 mg 4 tablets daily/  Okay to refill Prednisone?

## 2021-07-15 NOTE — Telephone Encounter (Signed)
Please schedule patient a follow up visit. Patient due November 2023. Thanks!  

## 2021-07-18 ENCOUNTER — Ambulatory Visit (INDEPENDENT_AMBULATORY_CARE_PROVIDER_SITE_OTHER): Payer: Medicare PPO

## 2021-07-18 DIAGNOSIS — Z0001 Encounter for general adult medical examination with abnormal findings: Secondary | ICD-10-CM

## 2021-07-18 DIAGNOSIS — Z0100 Encounter for examination of eyes and vision without abnormal findings: Secondary | ICD-10-CM

## 2021-07-18 DIAGNOSIS — Z0000 Encounter for general adult medical examination without abnormal findings: Secondary | ICD-10-CM | POA: Diagnosis not present

## 2021-07-18 DIAGNOSIS — Z1231 Encounter for screening mammogram for malignant neoplasm of breast: Secondary | ICD-10-CM | POA: Diagnosis not present

## 2021-07-18 NOTE — Patient Instructions (Addendum)
  Stacy Meyers , Thank you for taking time to come for your Medicare Wellness Visit. I appreciate your ongoing commitment to your health goals. Please review the following plan we discussed and let me know if I can assist you in the future.   These are the goals we discussed:  Goals      Patient Stated     Get mammogram and bone density on Aug 2 at 10:30am     Prevent falls     Chair rise exercises mailed         This is a list of the screening recommended for you and due dates:  Health Maintenance  Topic Date Due   Tetanus Vaccine  04/28/2020   Urine Protein Check  03/07/2021   Flu Shot  08/05/2021   Pneumonia Vaccine  Completed   DEXA scan (bone density measurement)  Completed   COVID-19 Vaccine  Completed   HPV Vaccine  Aged Out   Zoster (Shingles) Vaccine  Discontinued

## 2021-07-18 NOTE — Progress Notes (Signed)
I connected with  Stacy Meyers on 07/18/21 by a audio enabled telemedicine application and verified that I am speaking with the correct person using two identifiers.  Patient Location: Home  Provider Location: Office/Clinic  I discussed the limitations of evaluation and management by telemedicine. The patient expressed understanding and agreed to proceed.  Subjective:   Stacy Meyers is a 88 y.o. female who presents for Medicare Annual (Subsequent) preventive examination.  Review of Systems     Cardiac Risk Factors include: sedentary lifestyle;hypertension;dyslipidemia     Objective:    Today's Vitals   07/18/21 1428  PainSc: 0-No pain   There is no height or weight on file to calculate BMI.     07/18/2021    2:46 PM 07/17/2020    2:06 PM 07/17/2019    9:53 AM 05/09/2019    9:31 AM 04/03/2019   12:17 PM 04/03/2019    5:16 AM 05/24/2017    9:02 AM  Advanced Directives  Does Patient Have a Medical Advance Directive? No Yes Yes No Yes Yes Yes  Type of Advance Directive  Living will Healthcare Power of [place];Living will Stacy Meyers;Living will Living will  Does patient want to make changes to medical advance directive?  No - Patient declined   No - Patient declined  No - Patient declined  Copy of [place] in Chart?   No - copy requested  No - copy requested    Would patient like information on creating a medical advance directive? Yes (ED - Information included in AVS)   No - Patient declined       Current Medications (verified) Outpatient Encounter Medications as of 07/18/2021  Medication Sig   acetaminophen (TYLENOL) 500 MG tablet Take 1,000 mg by mouth every 6 (six) hours as needed (for pain).    acyclovir (ZOVIRAX) 400 MG tablet TAKE 1 TABLET BY MOUTH TWICE A DAY   albuterol (VENTOLIN HFA) 108 (90 Base) MCG/ACT inhaler TAKE 2 PUFFS BY MOUTH EVERY 6 HOURS AS NEEDED FOR WHEEZE OR SHORTNESS OF BREATH    alendronate (FOSAMAX) 70 MG tablet TAKE 1 TABLET BY MOUTH EVERY 7 DAYS. TAKE WITH A FULL GLASS OF WATER ON AN EMPTY STOMACH.   amLODipine (NORVASC) 5 MG tablet TAKE 1 TABLET BY MOUTH EVERY DAY   butalbital-acetaminophen-caffeine (FIORICET) 50-325-40 MG tablet Take one tablet by mouth once daily, as needed, for severe headache   folic acid (FOLVITE) 1 MG tablet TAKE 1 TABLET BY MOUTH EVERY DAY   hydrochlorothiazide (MICROZIDE) 12.5 MG capsule TAKE 1 CAPSULE BY MOUTH EVERY DAY   KLOR-CON M20 20 MEQ tablet TAKE 1 TABLET BY MOUTH EVERY DAY   methotrexate (RHEUMATREX) 2.5 MG tablet TAKE 3 TABLETS BY MOUTH EACH WEEK ON THE SAME DAY.   montelukast (SINGULAIR) 10 MG tablet Take 1 tablet (10 mg total) by mouth at bedtime.   predniSONE (DELTASONE) 1 MG tablet TAKE 4 TABLETS (4 MG TOTAL) BY MOUTH DAILY WITH BREAKFAST   promethazine-dextromethorphan (PROMETHAZINE-DM) 6.25-15 MG/5ML syrup Take 5 mLs by mouth 4 (four) times daily as needed for cough.   Propylene Glycol (SYSTANE BALANCE OP) Place 1 drop into both eyes daily as needed (for dry eyes).    rosuvastatin (CRESTOR) 5 MG tablet TAKE 1 TABLET BY MOUTH EVERY OTHER DAY   No facility-administered encounter medications on file as of 07/18/2021.    Allergies (verified) Aspirin and Sudafed [pseudoephedrine hcl]   History: Past Medical History:  Diagnosis Date   Acid reflux    Acute cystitis with hematuria 04/11/2011   Anemia    ARF (acute renal failure) ([place]) 12/14/2010   Arthritis    CKD (chronic kidney disease), stage II    previously seen by nephrologist--- dr Florene Glen,  pt released per lov in epic 09-24-2017   Diabetes mellitus, type II ([place])    Diverticulosis of colon    Essential hypertension, benign    followed by pcp   (pt had nuclear stress test (in epic under media) dated 05-25-2008 showed normal perfusion w/ no evidence ischemia, ef 83%   Heart disease    History of bradycardia    previously seen by cardiologist, dr branch, pt released per  lov 08-11-2018 in epic, bradycardia resolved   History of sepsis    04-03-2019 hospital admission w/ urosepsis due to obstructive uropathy due to right ureter stone   Hypokalemia    Lipoma of colon    ascending   Lumbar spondylosis    Mixed hyperlipidemia    Nephrolithiasis    per CT 04-03-2019 nonobstructive bilateral stones   Ocular herpes    Osteoarthritis    followed by dr Estanislado Pandy--- hands, shoulders, feet   Osteoporosis    Rheumatoid arthritis(714.0)    rheumotologist-- dr s. Estanislado Pandy---  multiple sites, treated with methotrexate/ prednisone   Right nephrolithiasis-1.4cm Moderate right-sided urinary tract obstruction/hydronephrosis 04/03/2019   Right ureteral calculus    Seasonal allergies    Sepsis ([place]) 04/03/2019   Sepsis secondary to UTI /Nephrolithiasis with obstructive uropathy 04/03/2019   SUI (stress urinary incontinence, female)    Uterine fibroid    Past Surgical History:  Procedure Laterality Date   BUNIONECTOMY Right 2012   CATARACT EXTRACTION W/ INTRAOCULAR LENS  IMPLANT, BILATERAL  2009   CHOLECYSTECTOMY  12/17/2010   Procedure: LAPAROSCOPIC CHOLECYSTECTOMY;  Surgeon: Donato Heinz;  Location: [facility];  Service: General;  Laterality: N/A;   COLONOSCOPY  05/22/2011   Procedure: COLONOSCOPY;  Surgeon: Rogene Houston, MD;  Location: [facility];  Service: Endoscopy;  Laterality: N/A;  [place], URETEROSCOPY AND STENT PLACEMENT Right 05/09/2019   Procedure: CYSTOSCOPY WITH RETROGRADE PYELOGRAM, URETEROSCOPY AND STENT PLACEMENT;  Surgeon: Robley Fries, MD;  [facility];  Service: Urology;  Laterality: Right;  1 HR   CYSTOSCOPY WITH STENT PLACEMENT Right 04/03/2019   Procedure: CYSTOSCOPY, RIGHT RETROGRADE PYELOGRAM  WITH STENT PLACEMENT;  Surgeon: Robley Fries, MD;  Location: [facility];  Service: Urology;  Laterality: Right;   EXCISION BONE CYST Right 2010   RIGHT SHOULDER   GANGLION CYST EXCISION  Left 06/02/2012   Procedure: Excision ganglion left dorsal wrist;  Surgeon: Sanjuana Kava, MD;  Location: [facility];  Service: Orthopedics;  Laterality: Left;   HOLMIUM LASER APPLICATION Right 05/11/4330   Procedure: HOLMIUM LASER APPLICATION;  Surgeon: Robley Fries, MD;  Location: [facility];  Service: Urology;  Laterality: Right;   kidney stones     LUMBAR MICRODISCECTOMY  07-16-2000   '@[facility]'$    L 2  -3   POSTERIOR LAMINECTOMY / DECOMPRESSION LUMBAR SPINE  06-26-2009   '@[facility]'$    w/ LATERAL MASS FUSION , L3 -- 5   SHOULDER ARTHROSCOPY WITH DISTAL CLAVICLE RESECTION Right 01-10-2009   '@[facility]'$    w/ DEBRIDEMENT AND EXCISION RECURRENT GANGLION CYST   Family History  Problem Relation Age of Onset   Stroke Mother    Cancer Father    Diabetes Sister  Diabetes Sister    Kidney failure Brother    Kidney failure Brother    Pancreatic cancer Sister    Kidney Stones Sister        Only one Kidney   Arthritis Sister    Heart disease Daughter    Social History   Socioeconomic History   Marital status: Widowed    Spouse name: Not on file   Number of children: 1   Years of education: Not on file   Highest education level: Not on file  Occupational History   Occupation: Retired    Comment: [employer]  Tobacco Use   Smoking status: Never   Smokeless tobacco: Never  Vaping Use   Vaping Use: Never used  Substance and Sexual Activity   Alcohol use: No   Drug use: Never   Sexual activity: Not Currently    Birth control/protection: Post-menopausal  Other Topics Concern   Not on file  Social History Narrative   Husband passed away 04-12-16. It has been financially harder to pay the bills since his passing, but she is making it. Has grandchildren around which makes it easier.    Social Determinants of Health   Financial Resource Strain: Low Risk  (07/18/2021)   Overall Financial Resource Strain (CARDIA)    Difficulty of Paying Living Expenses: Not hard at all  Food  Insecurity: No Food Insecurity (07/18/2021)   Hunger Vital Sign    Worried About Running Out of Food in the Last Year: Never true    Ran Out of Food in the Last Year: Never true  Transportation Needs: No Transportation Needs (07/18/2021)   PRAPARE - Hydrologist (Medical): No    Lack of Transportation (Non-Medical): No  Physical Activity: Insufficiently Active (07/18/2021)   Exercise Vital Sign    Days of Exercise per Week: 3 days    Minutes of Exercise per Session: 10 min  Stress: No Stress Concern Present (07/17/2020)   [place]    Feeling of Stress : Not at all  Social Connections: Moderately Isolated (07/17/2020)   Social Connection and Isolation Panel [NHANES]    Frequency of Communication with Friends and Family: More than three times a week    Frequency of Social Gatherings with Friends and Family: More than three times a week    Attends Religious Services: More than 4 times per year    Active Member of [employer] or Organizations: No    Attends Archivist Meetings: Never    Marital Status: Widowed    Tobacco Counseling Counseling given: Not Answered   Clinical Intake:  Pre-visit preparation completed: No  Pain : No/denies pain Pain Score: 0-No pain     Diabetes: No  How often do you need to have someone help you when you read instructions, pamphlets, or other written materials from your doctor or pharmacy?: 2 - Rarely  Diabetic?na  Interpreter Needed?: No      Activities of Daily Living    07/18/2021    2:47 PM  In your present state of health, do you have any difficulty performing the following activities:  Hearing? 0  Vision? 0  Difficulty concentrating or making decisions? 0  Walking or climbing stairs? 1  Dressing or bathing? 0  Doing errands, shopping? 0  Preparing Food and eating ? N  Using the Toilet? N  In the past six months, have you accidently  leaked urine? N  Do  you have problems with loss of bowel control? N  Managing your Medications? N  Managing your Finances? N    Patient Care Team: Fayrene Helper, MD as PCP - General Branch, Alphonse Guild, MD as PCP - Cardiology (Cardiology) Estanislado Emms, MD (Inactive) as Consulting Physician (Nephrology) Bo Merino, MD as Consulting Physician (Rheumatology) Beryle Lathe, [facility] (Inactive) (Pharmacist) Kassie Mends, RN as [place] Management  Indicate any recent Medical Services you may have received from other than [facility] providers in the past year (date may be approximate).     Assessment:   This is a routine wellness examination for Sh[place]issa.  Hearing/Vision screen No results found.  Dietary issues and exercise activities discussed: Current Exercise Habits: The patient does not participate in regular exercise at present   Goals Addressed             This Visit's Progress    Patient Stated       Get mammogram and bone density on Aug 2 at 10:30am     Prevent falls       [place] rise exercises mailed        Depression Screen    05/27/2021   12:49 PM 05/26/2021    9:20 AM 05/19/2021   10:24 AM 03/13/2021   10:06 AM 10/24/2020    9:53 AM 07/17/2020    2:08 PM 07/17/2020    2:04 PM  PHQ 2/9 Scores  PHQ - 2 Score 0 0 0 0 0 0 0  PHQ- 9 Score     0      Fall Risk    05/27/2021   12:49 PM 05/26/2021    9:20 AM 05/19/2021   10:23 AM 03/13/2021   10:06 AM 10/24/2020    9:53 AM  Fall Risk   Falls in the past year? 0 0 0 0 0  Number falls in past yr:  0 0 0   Injury with Fall?  0 0 0   Risk for fall due to :  No Fall Risks No Fall Risks    Follow up  Falls evaluation completed Falls evaluation completed      [place]:  Any stairs in or around the home? No  If so, are there any without handrails? No  Home free of loose throw rugs in walkways, pet beds, electrical cords, etc? Yes  Adequate  lighting in your home to reduce risk of falls? Yes   ASSISTIVE DEVICES UTILIZED TO PREVENT FALLS:  Life alert? No  Use of a cane, walker or w/c? Yes  Grab bars in the bathroom? Yes  Shower chair or bench in shower? Yes  Elevated toilet seat or a handicapped toilet? Yes    Cognitive Function:    07/17/2020    2:08 PM 05/01/2020   10:44 AM  MMSE - Mini Mental State Exam  Not completed: Unable to complete   Orientation to time  5  Orientation to Place  5  Registration  3  Attention/ Calculation  5  Recall  2  Language- name 2 objects  2  Language- repeat  1  Language- follow 3 step command  3  Language- read & follow direction  1  Write a sentence  1  Copy design  1  Total score  29        07/18/2021    2:31 PM 07/17/2020    2:09 PM 07/17/2019    9:56 AM 05/27/2018  8:02 AM 05/24/2017    9:06 AM  6CIT Screen  What Year? 0 points  0 points 0 points 0 points  What month? 0 points  0 points 0 points 0 points  What time? 0 points 0 points 0 points 0 points 0 points  Count back from 20 0 points 0 points 0 points 0 points 0 points  Months in reverse 2 points 2 points 2 points 2 points 0 points  Repeat phrase 0 points 2 points 0 points 0 points 0 points  Total Score 2 points  2 points 2 points 0 points    Immunizations Immunization History  Administered Date(s) Administered   Fluad Quad(high Dose 65+) 10/19/2019   Influenza Split 11/07/2013   Influenza Whole 10/28/2009, 09/17/2010   Influenza, High Dose Seasonal PF 09/29/2016, 09/15/2018   Influenza,inj,Quad PF,6+ Mos 09/03/2014   Influenza-Unspecified 10/05/2013, 09/29/2016   Moderna Sars-Covid-2 Vaccination 01/17/2019, 02/27/2019, 10/30/2019   PPD Test 05/14/2014, 05/28/2015   Pneumococcal Conjugate-13 08/03/2013   Pneumococcal Polysaccharide-23 08/28/2009   Tdap 04/29/2010    TDAP status: Due, Education has been provided regarding the importance of this vaccine. Advised may receive this vaccine at local pharmacy  or Health Dept. Aware to provide a copy of the vaccination record if obtained from local pharmacy or Health Dept. Verbalized acceptance and understanding.  Flu Vaccine status: Up to date  Pneumococcal vaccine status: Up to date  Covid-19 vaccine status: Completed vaccines  Qualifies for Shingles Vaccine? Yes   Zostavax completed No   Shingrix Completed?: No.    Education has been provided regarding the importance of this vaccine. Patient has been advised to call insurance company to determine out of pocket expense if they have not yet received this vaccine. Advised may also receive vaccine at local pharmacy or Health Dept. Verbalized acceptance and understanding.  Screening Tests Health Maintenance  Topic Date Due   TETANUS/TDAP  04/28/2020   URINE MICROALBUMIN  03/07/2021   INFLUENZA VACCINE  08/05/2021   Pneumonia Vaccine 32+ Years old  Completed   DEXA SCAN  Completed   COVID-19 Vaccine  Completed   HPV VACCINES  Aged Out   Zoster Vaccines- Shingrix  Discontinued    Health Maintenance  Health Maintenance Due  Topic Date Due   TETANUS/TDAP  04/28/2020   URINE MICROALBUMIN  03/07/2021    Colorectal cancer screening: No longer required.   Mammogram status: Ordered  . Pt provided with contact info and advised to call to schedule appt.   Bone Density status: Ordered  . Pt provided with contact info and advised to call to schedule appt.  Lung Cancer Screening: (Low Dose CT Chest recommended if Age 65-80 years, 30 pack-year currently smoking OR have quit w/in 15years.) does not qualify.   Lung Cancer Screening Referral: na  Additional Screening:  Hepatitis C Screening: does not qualify; Completed   Vision Screening: Recommended annual ophthalmology exams for early detection of glaucoma and other disorders of the eye. Is the patient up to date with their annual eye exam?  No  Who is the provider or what is the name of the office in which the patient attends annual eye  exams? Dr Jorja Loa If pt is not established with a provider, would they like to be referred to a provider to establish care? Yes .   Dental Screening: Recommended annual dental exams for proper oral hygiene  Community Resource Referral / Chronic Care Management: CRR required this visit?  No   CCM required this visit?  No      Plan:     I have personally reviewed and noted the following in the patient's chart:   Medical and social history Use of alcohol, tobacco or illicit drugs  Current medications and supplements including opioid prescriptions.  Functional ability and status Nutritional status Physical activity Advanced directives List of other physicians Hospitalizations, surgeries, and ER visits in previous 12 months Vitals Screenings to include cognitive, depression, and falls Referrals and appointments  In addition, I have reviewed and discussed with patient certain preventive protocols, quality metrics, and best practice recommendations. A written personalized care plan for preventive services as well as general preventive health recommendations were provided to patient.     Eual Fines, LPN   1/61/0960   Nurse Notes: Schedule your next wellness visit for 1 year at checkout

## 2021-07-31 ENCOUNTER — Ambulatory Visit (INDEPENDENT_AMBULATORY_CARE_PROVIDER_SITE_OTHER): Payer: Medicare PPO | Admitting: *Deleted

## 2021-07-31 DIAGNOSIS — E785 Hyperlipidemia, unspecified: Secondary | ICD-10-CM

## 2021-07-31 DIAGNOSIS — I10 Essential (primary) hypertension: Secondary | ICD-10-CM

## 2021-07-31 NOTE — Patient Instructions (Signed)
Visit Information  Thank you for taking time to visit with me today. Please don't hesitate to contact me if I can be of assistance to you before our next scheduled telephone appointment.  Following are the goals we discussed today:  Take medications as prescribed   Attend all scheduled provider appointments Call pharmacy for medication refills 3-7 days in advance of running out of medications Attend church or other social activities Call provider office for new concerns or questions  check blood pressure weekly choose a place to take my blood pressure (home, clinic or office, retail store) write blood pressure results in a log or diary keep a blood pressure log take blood pressure log to all doctor appointments keep all doctor appointments take medications for blood pressure exactly as prescribed eat more whole grains, fruits and vegetables, lean meats and healthy fats take all medications exactly as prescribed call doctor with any symptoms you believe are related to your medicine call doctor when you experience any new symptoms go to all doctor appointments as scheduled adhere to prescribed diet: heart healthy and low sodium Follow heart healthy and low sodium diet Please check blood pressure at least weekly and record Case closure today, please let your doctor know if you have any care management needs in the future  Please call the care guide team at [phone] if you need to cancel or reschedule your appointment.   If you are experiencing a Mental Health or [place] or need someone to talk to, please call the Suicide and Crisis Lifeline: 988 call the [place] National Suicide Prevention Lifeline: 559-435-0273 or TTY: 612-077-3144 TTY (814)488-9176) to talk to a trained counselor call 1-800-273-TALK (toll free, 24 hour hotline) go to [facility] [address], [place] [id number]) call the [place]:  ([phone] call 911   The patient verbalized understanding of instructions, educational materials, and care plan provided today and DECLINED offer to receive copy of patient instructions, educational materials, and care plan.   Jacqlyn [facility], BSN RN Case Manager [facility] [phone]

## 2021-07-31 NOTE — Chronic Care Management (AMB) (Signed)
Chronic Care Management   CCM RN Visit Note  07/31/2021 Name: Stacy Meyers MRN: [id number] DOB: Sep 13, 1932  Subjective: Stacy Meyers is a 88 y.o. year old female who is a primary care patient of Stacy Helper, MD. The care management team was consulted for assistance with disease management and care coordination needs.    Engaged with patient by telephone for follow up visit in response to provider referral for case management and/or care coordination services.   Consent to Services:  The patient was given information about Chronic Care Management services, agreed to services, and gave verbal consent prior to initiation of services.  Please see initial visit note for detailed documentation.   Patient agreed to services and verbal consent obtained.   Assessment: Review of patient past medical history, allergies, medications, health status, including review of consultants reports, laboratory and other test data, was performed as part of comprehensive evaluation and provision of chronic care management services.   SDOH (Social Determinants of Health) assessments and interventions performed:    CCM Care Plan  Allergies  Allergen Reactions   Aspirin Other (See Comments)    REACTION: burns stomach (uncoated)   Sudafed [Pseudoephedrine Hcl] Other (See Comments)    Increased joint pain    Outpatient Encounter Medications as of 07/31/2021  Medication Sig Note   acetaminophen (TYLENOL) 500 MG tablet Take 1,000 mg by mouth every 6 (six) hours as needed (for pain).     acyclovir (ZOVIRAX) 400 MG tablet TAKE 1 TABLET BY MOUTH TWICE A DAY    albuterol (VENTOLIN HFA) 108 (90 Base) MCG/ACT inhaler TAKE 2 PUFFS BY MOUTH EVERY 6 HOURS AS NEEDED FOR WHEEZE OR SHORTNESS OF BREATH    alendronate (FOSAMAX) 70 MG tablet TAKE 1 TABLET BY MOUTH EVERY 7 DAYS. TAKE WITH A FULL GLASS OF WATER ON AN EMPTY STOMACH. 03/28/2021: Takes every Thursday   amLODipine (NORVASC) 5 MG tablet TAKE 1 TABLET BY MOUTH  EVERY DAY    butalbital-acetaminophen-caffeine (FIORICET) 50-325-40 MG tablet Take one tablet by mouth once daily, as needed, for severe headache    folic acid (FOLVITE) 1 MG tablet TAKE 1 TABLET BY MOUTH EVERY DAY    hydrochlorothiazide (MICROZIDE) 12.5 MG capsule TAKE 1 CAPSULE BY MOUTH EVERY DAY    KLOR-CON M20 20 MEQ tablet TAKE 1 TABLET BY MOUTH EVERY DAY    methotrexate (RHEUMATREX) 2.5 MG tablet TAKE 3 TABLETS BY MOUTH EACH WEEK ON THE SAME DAY. 03/28/2021: Takes every Thursday   montelukast (SINGULAIR) 10 MG tablet Take 1 tablet (10 mg total) by mouth at bedtime.    predniSONE (DELTASONE) 1 MG tablet TAKE 4 TABLETS (4 MG TOTAL) BY MOUTH DAILY WITH BREAKFAST    promethazine-dextromethorphan (PROMETHAZINE-DM) 6.25-15 MG/5ML syrup Take 5 mLs by mouth 4 (four) times daily as needed for cough.    Propylene Glycol (SYSTANE BALANCE OP) Place 1 drop into both eyes daily as needed (for dry eyes).     rosuvastatin (CRESTOR) 5 MG tablet TAKE 1 TABLET BY MOUTH EVERY OTHER DAY    No facility-administered encounter medications on file as of 07/31/2021.    Patient Active Problem List   Diagnosis Date Noted   Age related osteoporosis 02/07/2021   Acute cystitis without hematuria 10/27/2020   Mass of left lower leg 10/24/2020   Headache 08/26/2020   Prediabetes 10/28/2019   H/o Chronic heart failure with preserved ejection fraction (HFpEF) ([place]) 36/62/9476   Umbilical hernia without obstruction and without gangrene 12/10/2018   Fibroids 05/08/2016  Spondylosis of lumbar region without myelopathy or radiculopathy 12/24/2015   Solitary bone cyst of right shoulder 05/28/2015   Right knee pain 05/28/2015   Osteoarthritis of both shoulders 05/28/2015   Overweight (BMI 25.0-29.9) 04/28/2014   Ganglion cyst of wrist 87/86/7672   Systolic murmur 09/47/0962   Diastolic dysfunction 83/66/2947   Hypercalcemia 12/14/2010   Seasonal allergies 08/28/2010   Herpes simplex virus (HSV) infection 09/16/2008    Rheumatoid arthritis with rheumatoid factor of multiple sites without organ or systems involvement ([place]) 09/16/2008   Hyperlipidemia LDL goal <100 05/23/2008   Essential hypertension, benign 05/23/2008    Conditions to be addressed/monitored:HTN and HLD  Care Plan : RN Care Manager Plan of Care  Updates made by Stacy Mends, RN since 07/31/2021 12:00 AM  Completed 07/31/2021   Problem: No plan of care established for management of chronic disease state  (HTN, HLD) Resolved 07/31/2021  Priority: High     Long-Range Goal: Development of plan of care for chronic disease management  (HTN, HLD) Completed 07/31/2021  Start Date: 05/27/2021  Expected End Date: 11/23/2021  Priority: High  Note:   Current Barriers:  Knowledge Deficits related to plan of care for management of HTN and HLD  Chronic Disease Management support and education needs related to HTN and HLD Spoke with patient's granddaughter Stacy Meyers and patient, pt reports has grandson lives with her and she has assistance of family members if needed, pt has blood pressure cuff but does not check blood pressure, pt reports she tries to eat healthy, does " a little" exercise, reports her bathroom is currently being remodeled due to mold and mildew, reports she has all medications and taking as prescribed and family assists/ provides oversight with medications as needed. 07/31/21  Patient reports bathroom remodeling is complete, states she has all medications and taking as prescribed, granddaughter continues to provide oversight, no new concerns reported today.  RNCM Clinical Goal(s):  Patient will verbalize understanding of plan for management of HTN and HLD as evidenced by patient report, review of EHR and  through collaboration with RN Care manager, provider, and care team.   Interventions: 1:1 collaboration with primary care provider regarding development and update of comprehensive plan of care as evidenced by provider  attestation and co-signature Inter-disciplinary care team collaboration (see longitudinal plan of care) Evaluation of current treatment plan related to  self management and patient's adherence to plan as established by provider   Hyperlipidemia:  (Status: New goal. Goal on Track (progressing): YES.) Long Term Goal  Lab Results  Component Value Date   CHOL 149 03/13/2021   HDL 60 03/13/2021   LDLCALC 68 03/13/2021   TRIG 120 03/13/2021   CHOLHDL 2.5 03/13/2021     Medication review performed; medication list updated in electronic medical record.  Counseled on importance of regular laboratory monitoring as prescribed; Provided HLD educational materials; Reviewed role and benefits of statin for ASCVD risk reduction; Reviewed importance of limiting foods high in cholesterol; Reviewed exercise goals and target of 150 minutes per week; Reviewed heart healthy diet  Hypertension Interventions:  (Status:  New goal. and Goal on track:  Yes.) Long Term Goal Last practice recorded BP readings:  BP Readings from Last 3 Encounters:  05/26/21 138/78  03/13/21 120/80  01/15/21 109/63  Most recent eGFR/CrCl:  Lab Results  Component Value Date   EGFR 59 (L) 03/13/2021    No components found for: CRCL  Evaluation of current treatment plan related to hypertension self management and  patient's adherence to plan as established by provider Reviewed medications with patient and discussed importance of compliance Counseled on the importance of exercise goals with target of 150 minutes per week Discussed complications of poorly controlled blood pressure such as heart disease, stroke, circulatory complications, vision complications, kidney impairment, sexual dysfunction Reviewed low sodium diet RN reinforced with patient to check blood pressure at least once weekly and record Reviewed upcoming scheduled appointments for next week with patient and granddaughter Reviewed plan of care with patient and  granddaughter including case closure today   Patient Goals/Self-Care Activities: Take medications as prescribed   Attend all scheduled provider appointments Call pharmacy for medication refills 3-7 days in advance of running out of medications Attend church or other social activities Call provider office for new concerns or questions  check blood pressure weekly choose a place to take my blood pressure (home, clinic or office, retail store) write blood pressure results in a log or diary keep a blood pressure log take blood pressure log to all doctor appointments keep all doctor appointments take medications for blood pressure exactly as prescribed eat more whole grains, fruits and vegetables, lean meats and healthy fats - take all medications exactly as prescribed - call doctor with any symptoms you believe are related to your medicine - call doctor when you experience any new symptoms - go to all doctor appointments as scheduled - adhere to prescribed diet: heart healthy and low sodium Follow heart healthy and low sodium diet Please check blood pressure at least weekly and record Case closure today, please let your doctor know if you have any care management needs in the future       Plan:No further follow up required: case closure today  Jacqlyn Larsen [facility], BSN RN Case Manager [facility] [phone]

## 2021-08-04 DIAGNOSIS — M810 Age-related osteoporosis without current pathological fracture: Secondary | ICD-10-CM | POA: Diagnosis not present

## 2021-08-04 DIAGNOSIS — E785 Hyperlipidemia, unspecified: Secondary | ICD-10-CM

## 2021-08-04 DIAGNOSIS — I10 Essential (primary) hypertension: Secondary | ICD-10-CM

## 2021-08-05 LAB — PTH, INTACT AND CALCIUM
Calcium: 10.1 mg/dL (ref 8.7–10.3)
PTH: 77 pg/mL — ABNORMAL HIGH (ref 15–65)

## 2021-08-05 LAB — VITAMIN D 25 HYDROXY (VIT D DEFICIENCY, FRACTURES): Vit D, 25-Hydroxy: 41.5 ng/mL (ref 30.0–100.0)

## 2021-08-05 LAB — PHOSPHORUS: Phosphorus: 2.5 mg/dL — ABNORMAL LOW (ref 3.0–4.3)

## 2021-08-05 LAB — MAGNESIUM: Magnesium: 2.3 mg/dL (ref 1.6–2.3)

## 2021-08-06 ENCOUNTER — Ambulatory Visit (HOSPITAL_COMMUNITY)
Admission: RE | Admit: 2021-08-06 | Discharge: 2021-08-06 | Disposition: A | Discharge status: Home / Self Care | Payer: Medicare PPO | Source: Ambulatory Visit | Attending: "Endocrinology | Admitting: "Endocrinology

## 2021-08-06 ENCOUNTER — Ambulatory Visit (INDEPENDENT_AMBULATORY_CARE_PROVIDER_SITE_OTHER): Payer: Medicare PPO | Admitting: "Endocrinology

## 2021-08-06 ENCOUNTER — Encounter: Payer: Self-pay | Admitting: "Endocrinology

## 2021-08-06 ENCOUNTER — Ambulatory Visit (HOSPITAL_COMMUNITY)
Admission: RE | Admit: 2021-08-06 | Discharge: 2021-08-06 | Disposition: A | Discharge status: Home / Self Care | Payer: Medicare PPO | Source: Ambulatory Visit | Attending: Family Medicine | Admitting: Family Medicine

## 2021-08-06 VITALS — BP 131/80 | HR 58 | Ht 59.0 in | Wt 131.0 lb

## 2021-08-06 DIAGNOSIS — M810 Age-related osteoporosis without current pathological fracture: Secondary | ICD-10-CM | POA: Insufficient documentation

## 2021-08-06 DIAGNOSIS — R7303 Prediabetes: Secondary | ICD-10-CM

## 2021-08-06 DIAGNOSIS — Z1231 Encounter for screening mammogram for malignant neoplasm of breast: Secondary | ICD-10-CM | POA: Insufficient documentation

## 2021-08-06 DIAGNOSIS — M8589 Other specified disorders of bone density and structure, multiple sites: Secondary | ICD-10-CM | POA: Diagnosis not present

## 2021-08-06 DIAGNOSIS — Z780 Asymptomatic menopausal state: Secondary | ICD-10-CM | POA: Diagnosis not present

## 2021-08-06 NOTE — Progress Notes (Signed)
08/06/2021         Endocrinology follow-up note   Past Medical History:  Diagnosis Date   Acid reflux    Acute cystitis with hematuria 04/11/2011   Anemia    ARF (acute renal failure) ([place]) 12/14/2010   Arthritis    CKD (chronic kidney disease), stage II    previously seen by nephrologist--- dr Florene Glen,  pt released per lov in epic 09-24-2017   Diabetes mellitus, type II ([place])    Diverticulosis of colon    Essential hypertension, benign    followed by pcp   (pt had nuclear stress test (in epic under media) dated 05-25-2008 showed normal perfusion w/ no evidence ischemia, ef 83%   Heart disease    History of bradycardia    previously seen by cardiologist, dr branch, pt released per lov 08-11-2018 in epic, bradycardia resolved   History of sepsis    04-03-2019 hospital admission w/ urosepsis due to obstructive uropathy due to right ureter stone   Hypokalemia    Lipoma of colon    ascending   Lumbar spondylosis    Mixed hyperlipidemia    Nephrolithiasis    per CT 04-03-2019 nonobstructive bilateral stones   Ocular herpes    Osteoarthritis    followed by dr Estanislado Pandy--- hands, shoulders, feet   Osteoporosis    Rheumatoid arthritis(714.0)    rheumotologist-- dr s. Estanislado Pandy---  multiple sites, treated with methotrexate/ prednisone   Right nephrolithiasis-1.4cm Moderate right-sided urinary tract obstruction/hydronephrosis 04/03/2019   Right ureteral calculus    Seasonal allergies    Sepsis ([place]) 04/03/2019   Sepsis secondary to UTI /Nephrolithiasis with obstructive uropathy 04/03/2019   SUI (stress urinary incontinence, female)    Uterine fibroid    Past Surgical History:  Procedure Laterality Date   BUNIONECTOMY Right 2012   CATARACT EXTRACTION W/ INTRAOCULAR LENS  IMPLANT, BILATERAL  2009   CHOLECYSTECTOMY  12/17/2010   Procedure: LAPAROSCOPIC CHOLECYSTECTOMY;  Surgeon: Donato Heinz;  Location: [facility];  Service:  General;  Laterality: N/A;   COLONOSCOPY  05/22/2011   Procedure: COLONOSCOPY;  Surgeon: Rogene Houston, MD;  Location: [facility];  Service: Endoscopy;  Laterality: N/A;  [place], URETEROSCOPY AND STENT PLACEMENT Right 05/09/2019   Procedure: CYSTOSCOPY WITH RETROGRADE PYELOGRAM, URETEROSCOPY AND STENT PLACEMENT;  Surgeon: Robley Fries, MD;  Location: [facility];  Service: Urology;  Laterality: Right;  1 HR   CYSTOSCOPY WITH STENT PLACEMENT Right 04/03/2019   Procedure: CYSTOSCOPY, RIGHT RETROGRADE PYELOGRAM  WITH STENT PLACEMENT;  Surgeon: Robley Fries, MD;  Location: [facility];  Service: Urology;  Laterality: Right;   EXCISION BONE CYST Right 2010   RIGHT SHOULDER   GANGLION CYST EXCISION Left 06/02/2012   Procedure: Excision ganglion left dorsal wrist;  Surgeon: Sanjuana Kava, MD;  Location: [facility];  Service: Orthopedics;  Laterality: Left;   HOLMIUM LASER APPLICATION Right 04/11/9627   Procedure: HOLMIUM LASER APPLICATION;  Surgeon: Robley Fries, MD;  Location: [facility];  Service: Urology;  Laterality: Right;   kidney stones     LUMBAR MICRODISCECTOMY  07-16-2000   '@[facility]'$    L 2  -3   POSTERIOR LAMINECTOMY / DECOMPRESSION LUMBAR SPINE  06-26-2009   '@[facility]'$    w/ LATERAL MASS FUSION , L3 -- 5   SHOULDER ARTHROSCOPY WITH DISTAL CLAVICLE RESECTION Right 01-10-2009   '@[facility]'$    w/ DEBRIDEMENT AND EXCISION RECURRENT GANGLION CYST   Social History   Socioeconomic History  Marital status: Widowed    Spouse name: Not on file   Number of children: 1   Years of education: Not on file   Highest education level: Not on file  Occupational History   Occupation: Retired    Comment: [employer]  Tobacco Use   Smoking status: Never   Smokeless tobacco: Never  Vaping Use   Vaping Use: Never used  Substance and Sexual Activity   Alcohol use: No   Drug use: Never   Sexual activity: Not Currently    Birth control/protection:  Post-menopausal  Other Topics Concern   Not on file  Social History Narrative   Husband passed away May 04, 2016. It has been financially harder to pay the bills since his passing, but she is making it. Has grandchildren around which makes it easier.    Social Determinants of Health   Financial Resource Strain: Low Risk  (07/18/2021)   Overall Financial Resource Strain (CARDIA)    Difficulty of Paying Living Expenses: Not hard at all  Food Insecurity: No Food Insecurity (07/18/2021)   Hunger Vital Sign    Worried About Running Out of Food in the Last Year: Never true    Ran Out of Food in the Last Year: Never true  Transportation Needs: No Transportation Needs (07/18/2021)   PRAPARE - Hydrologist (Medical): No    Lack of Transportation (Non-Medical): No  Physical Activity: Insufficiently Active (07/18/2021)   Exercise Vital Sign    Days of Exercise per Week: 3 days    Minutes of Exercise per Session: 10 min  Stress: No Stress Concern Present (07/17/2020)   [place]    Feeling of Stress : Not at all  Social Connections: Moderately Isolated (07/17/2020)   Social Connection and Isolation Panel [NHANES]    Frequency of Communication with Friends and Family: More than three times a week    Frequency of Social Gatherings with Friends and Family: More than three times a week    Attends Religious Services: More than 4 times per year    Active Member of [employer] or Organizations: No    Attends Archivist Meetings: Never    Marital Status: Widowed   Outpatient Encounter Medications as of 08/06/2021  Medication Sig   acetaminophen (TYLENOL) 500 MG tablet Take 1,000 mg by mouth every 6 (six) hours as needed (for pain).    acyclovir (ZOVIRAX) 400 MG tablet TAKE 1 TABLET BY MOUTH TWICE A DAY   albuterol (VENTOLIN HFA) 108 (90 Base) MCG/ACT inhaler TAKE 2 PUFFS BY MOUTH EVERY 6 HOURS AS NEEDED FOR  WHEEZE OR SHORTNESS OF BREATH   alendronate (FOSAMAX) 70 MG tablet TAKE 1 TABLET BY MOUTH EVERY 7 DAYS. TAKE WITH A FULL GLASS OF WATER ON AN EMPTY STOMACH.   amLODipine (NORVASC) 5 MG tablet TAKE 1 TABLET BY MOUTH EVERY DAY   butalbital-acetaminophen-caffeine (FIORICET) 50-325-40 MG tablet Take one tablet by mouth once daily, as needed, for severe headache   folic acid (FOLVITE) 1 MG tablet TAKE 1 TABLET BY MOUTH EVERY DAY   hydrochlorothiazide (MICROZIDE) 12.5 MG capsule TAKE 1 CAPSULE BY MOUTH EVERY DAY   KLOR-CON M20 20 MEQ tablet TAKE 1 TABLET BY MOUTH EVERY DAY   methotrexate (RHEUMATREX) 2.5 MG tablet TAKE 3 TABLETS BY MOUTH EACH WEEK ON THE SAME DAY.   montelukast (SINGULAIR) 10 MG tablet Take 1 tablet (10 mg total) by mouth at bedtime.   predniSONE (  DELTASONE) 1 MG tablet TAKE 4 TABLETS (4 MG TOTAL) BY MOUTH DAILY WITH BREAKFAST   promethazine-dextromethorphan (PROMETHAZINE-DM) 6.25-15 MG/5ML syrup Take 5 mLs by mouth 4 (four) times daily as needed for cough.   Propylene Glycol (SYSTANE BALANCE OP) Place 1 drop into both eyes daily as needed (for dry eyes).    rosuvastatin (CRESTOR) 5 MG tablet TAKE 1 TABLET BY MOUTH EVERY OTHER DAY   No facility-administered encounter medications on file as of 08/06/2021.   ALLERGIES: Allergies  Allergen Reactions   Aspirin Other (See Comments)    REACTION: burns stomach (uncoated)   Sudafed [Pseudoephedrine Hcl] Other (See Comments)    Increased joint pain    VACCINATION STATUS: Immunization History  Administered Date(s) Administered   Fluad Quad(high Dose 65+) 10/19/2019   Influenza Split 11/07/2013   Influenza Whole 10/28/2009, 09/17/2010   Influenza, High Dose Seasonal PF 09/29/2016, 09/15/2018   Influenza,inj,Quad PF,6+ Mos 09/03/2014   Influenza-Unspecified 10/05/2013, 09/29/2016   Moderna Sars-Covid-2 Vaccination 01/17/2019, 02/27/2019, 10/30/2019   PPD Test 05/14/2014, 05/28/2015   Pneumococcal Conjugate-13 08/03/2013    Pneumococcal Polysaccharide-23 08/28/2009   Tdap 04/29/2010     HPI   Stacy Meyers is 88 y.o. female who presents today for follow-up.  She was seen in consultation for osteoporosis during her last visit. PMD:  Fayrene Helper, MD.  Patient was diagnosed with osteoporosis at approximate age of 18 years.   She denies fractures or falls. No dizziness/vertigo/orthostasis. -She came alone today-see notes from prior visit.    She has been on Fosamax for at least 5 years.  She continues to tolerate this medication.    Review of her last 3 bone density studies show that there has been progressive improvement in her bone density.  Her most recent bone density confirms same.  She has no new complaints today.  Her labs show mild hypercalcemia associated with high PTH. She denies any prior history of nephrolithiasis nor parathyroid dysfunction. -Review of her medical records indicate a course of hypercalcemia between January and September 2020 which has resolved before her last visit.    No weight bearing exercises.   No history of thyrotoxicosis, she has no new complaints today. She has normal renal function.  Menopause was at 88 y/o.   -She did not report family history of osteoporosis.  She has well-controlled type 2 diabetes.  Because of her history of rheumatoid arthritis, she is on ongoing methotrexate treatment, and intermittent steroids.  -She reports losing 1 1/2 inches of height over time.  Review of Systems  Limited as above.  Objective:    BP 131/80   Pulse (!) 58   Ht '4\' 11"'$  (1.499 m)   Wt 131 lb (59.4 kg)   BMI 26.46 kg/m   Wt Readings from Last 3 Encounters:  08/06/21 131 lb (59.4 kg)  06/19/21 128 lb (58.1 kg)  05/26/21 134 lb 6.4 oz (61 kg)    Physical Exam  Constitutional: + normal  weight for height, not in acute distress, normal state of mind, + walks with a cane.   CMP ( most recent) CMP     Component Value Date/Time   NA 143 06/19/2021 1149   NA  139 03/13/2021 1051   K 4.0 06/19/2021 1149   CL 106 06/19/2021 1149   CO2 29 06/19/2021 1149   GLUCOSE 97 06/19/2021 1149   BUN 21 06/19/2021 1149   BUN 23 03/13/2021 1051   CREATININE 0.89 06/19/2021 1149   CALCIUM 10.1 08/04/2021  1136   PROT 6.5 06/19/2021 1149   PROT 6.5 03/13/2021 1051   ALBUMIN 4.3 03/13/2021 1051   AST 14 06/19/2021 1149   ALT 7 06/19/2021 1149   ALKPHOS 67 03/13/2021 1051   BILITOT 0.7 06/19/2021 1149   BILITOT 0.7 03/13/2021 1051   GFRNONAA 60 03/12/2020 1102   GFRAA 70 03/12/2020 1102     Diabetic Labs (most recent): Lab Results  Component Value Date   HGBA1C 6.2 (H) 03/13/2021   HGBA1C 6.1 (H) 10/24/2020   HGBA1C 6.3 (H) 05/30/2020   MICROALBUR 2.7 02/01/2019   MICROALBUR 51.9 (H) 12/08/2018   MICROALBUR 12.4 (H) 01/17/2018     Lipid Panel ( most recent) Lipid Panel     Component Value Date/Time   CHOL 149 03/13/2021 1051   TRIG 120 03/13/2021 1051   HDL 60 03/13/2021 1051   CHOLHDL 2.5 03/13/2021 1051   CHOLHDL 2.6 10/24/2019 1100   VLDL 26 02/24/2016 0832   LDLCALC 68 03/13/2021 1051   LDLCALC 69 10/24/2019 1100   LABVLDL 21 03/13/2021 1051      Lab Results  Component Value Date   TSH 1.390 03/13/2021   TSH 2.560 02/02/2020   TSH 0.98 10/24/2019   TSH 1.95 01/13/2018   TSH 2.354 01/24/2015   TSH 1.782 07/31/2013   TSH 0.915 04/16/2011   TSH 0.490 12/15/2010   TSH 1.426 03/03/2010   FREET4 1.29 02/02/2020   FREET4 0.97 12/15/2010         Bone density study from August 06, 2021 was reviewed. Assessment: 1. Osteoporosis  Plan: 1. Osteoporosis - likely postmenopausal    Review of her last 3 bone density studies showed that she is benefiting from treatment with alendronate a T score improving on her radius.     We reviewed her DEXA scans together, and I explained that based on the T scores, she is responding to her alendronate.  She continues to tolerate this medication and advised to continue alendronate 70 mg p.o.  weekly.   - Side effects and precautions discussed with her and her grandson in the room.   She is advised to not take any supplemental calcium.  She may benefit from continued vitamin D supplement.  Her labs continue to show mild hypercalcemia and mildly elevated PTH.  She will not need intervention at this time.  She is not a surgical candidate.  -I discussed with her all fall precautions and avoiding vigorous movements to reduce her risk of fracture.  She is encouraged to continue to use her cane to ambulate.  - we discussed about maintaining a good amount of protein in her diet. -She will have labs including PTH/calcium, CMP, thyroid function test before her next visit in 6 months.  - I advised patient to maintain close follow up with Fayrene Helper, MD for primary care needs.   I spent 30 minutes in the care of the patient today including review of labs from Thyroid Function, CMP, and other relevant labs ; imaging/biopsy records (current and previous including abstractions from other facilities); face-to-face time discussing  her lab results and symptoms, medications doses, her options of short and long term treatment based on the latest standards of care / guidelines;   and documenting the encounter.  Stacy Meyers  participated in the discussions, expressed understanding, and voiced agreement with the above plans.  All questions were answered to her satisfaction. she is encouraged to contact clinic should she have any questions or concerns prior to her  return visit.   Follow up plan: Return in about 6 months (around 02/06/2022) for F/U with Pre-visit Labs.   Glade Lloyd, MD [facility] [address] [place], [place] [address] Phone: [phone]  Fax: [phone]     08/06/2021, 6:03 PM  This note was partially dictated with voice recognition software. Similar sounding words can be transcribed inadequately or may not  be  corrected upon review.

## 2021-08-07 ENCOUNTER — Ambulatory Visit: Payer: Medicare PPO | Admitting: "Endocrinology

## 2021-09-01 ENCOUNTER — Other Ambulatory Visit: Payer: Self-pay | Admitting: Family Medicine

## 2021-09-01 DIAGNOSIS — E785 Hyperlipidemia, unspecified: Secondary | ICD-10-CM

## 2021-09-01 DIAGNOSIS — R7989 Other specified abnormal findings of blood chemistry: Secondary | ICD-10-CM

## 2021-09-01 DIAGNOSIS — M0579 Rheumatoid arthritis with rheumatoid factor of multiple sites without organ or systems involvement: Secondary | ICD-10-CM

## 2021-09-01 DIAGNOSIS — E1169 Type 2 diabetes mellitus with other specified complication: Secondary | ICD-10-CM

## 2021-09-01 DIAGNOSIS — E559 Vitamin D deficiency, unspecified: Secondary | ICD-10-CM

## 2021-09-01 DIAGNOSIS — I10 Essential (primary) hypertension: Secondary | ICD-10-CM

## 2021-09-05 ENCOUNTER — Telehealth: Payer: Self-pay | Admitting: Family Medicine

## 2021-09-05 ENCOUNTER — Other Ambulatory Visit: Payer: Self-pay | Admitting: Family Medicine

## 2021-09-05 NOTE — Telephone Encounter (Signed)
Pt called stating she is wanting to know if she can please get something for her scalp that is itching very badly?? Please advise

## 2021-09-09 ENCOUNTER — Other Ambulatory Visit: Payer: Self-pay | Admitting: Family Medicine

## 2021-09-09 NOTE — Progress Notes (Signed)
No prescription shampoo noted Will advise oTC

## 2021-09-10 ENCOUNTER — Telehealth: Payer: Self-pay | Admitting: Family Medicine

## 2021-09-10 NOTE — Telephone Encounter (Signed)
Returning phone call °

## 2021-09-10 NOTE — Telephone Encounter (Signed)
LMTRC

## 2021-09-10 NOTE — Telephone Encounter (Signed)
Patient aware of previous tele msg

## 2021-09-15 NOTE — Telephone Encounter (Signed)
[place] daughter aware of below.

## 2021-10-12 ENCOUNTER — Other Ambulatory Visit: Payer: Self-pay | Admitting: Rheumatology

## 2021-10-13 NOTE — Telephone Encounter (Signed)
Next Visit: 11/06/2021   Last Visit: 06/19/2021   Last Fill: 07/15/2021   Dx: Rheumatoid arthritis with rheumatoid factor of multiple sites without organ or systems involvement    Current Dose per office note on 06/19/2021: prednisone 1 mg 4 tablets daily/   Okay to refill Prednisone?

## 2021-10-23 ENCOUNTER — Encounter: Payer: Medicare PPO | Admitting: Family Medicine

## 2021-10-23 NOTE — Progress Notes (Signed)
Office Visit Note  Patient: Stacy Meyers             Date of Birth: 30-Apr-1932           MRN: [id number]             PCP: Fayrene Helper, MD Referring: Fayrene Helper, MD Visit Date: 11/06/2021 Occupation: '@GUAROCC'$ @  Subjective:  Medication monitoring  History of Present Illness: Stacy Meyers is a 88 y.o. female with history of rheumatoid arthritis and osteoarthritis overlap.  She has been taking methotrexate 3 tablets p.o. weekly along with prednisone 4 mg p.o. daily and folic acid 1 mg p.o. daily.  She denies any joint pain or joint inflammation.  She continues to have some stiffness in her joints 9.  She denies any difficulty getting dressed, nocturnal pain or difficulty walking.  She has no difficulty writing.  She has been taking her medications on a regular basis without any interruption.  She is unable to taper prednisone.  Activities of Daily Living:  Patient reports morning stiffness for a few minutes.   Patient Denies nocturnal pain.  Difficulty dressing/grooming: Denies Difficulty climbing stairs: Denies Difficulty getting out of chair: Denies Difficulty using hands for taps, buttons, cutlery, and/or writing: Denies  Review of Systems  Constitutional:  Positive for fatigue.  HENT:  Positive for mouth dryness. Negative for mouth sores.   Eyes:  Positive for dryness.  Respiratory:  Negative for shortness of breath.   Cardiovascular:  Positive for swelling in legs/feet. Negative for chest pain and palpitations.  Gastrointestinal:  Negative for blood in stool, constipation and diarrhea.  Endocrine: Negative for increased urination.  Genitourinary:  Negative for involuntary urination.  Musculoskeletal:  Positive for morning stiffness. Negative for joint pain, gait problem, joint pain, joint swelling, myalgias, muscle weakness, muscle tenderness and myalgias.  Skin:  Positive for sensitivity to sunlight. Negative for color change, rash and hair loss.   Allergic/Immunologic: Negative for susceptible to infections.  Neurological:  Negative for dizziness and headaches.  Hematological:  Negative for swollen glands.  Psychiatric/Behavioral:  Negative for depressed mood and sleep disturbance. The patient is not nervous/anxious.     PMFS History:  Patient Active Problem List   Diagnosis Date Noted   Age related osteoporosis 02/07/2021   Acute cystitis without hematuria 10/27/2020   Mass of left lower leg 10/24/2020   Headache 08/26/2020   Prediabetes 10/28/2019   H/o Chronic heart failure with preserved ejection fraction (HFpEF) ([place]) 22/02/5425   Umbilical hernia without obstruction and without gangrene 12/10/2018   Fibroids 05/08/2016   Spondylosis of lumbar region without myelopathy or radiculopathy 12/24/2015   Solitary bone cyst of right shoulder 05/28/2015   Right knee pain 05/28/2015   Osteoarthritis of both shoulders 05/28/2015   Overweight (BMI 25.0-29.9) 04/28/2014   Ganglion cyst of wrist 06/28/7626   Systolic murmur 31/51/7616   Diastolic dysfunction 07/37/1062   Hypercalcemia 12/14/2010   Seasonal allergies 08/28/2010   Herpes simplex virus (HSV) infection 09/16/2008   Rheumatoid arthritis with rheumatoid factor of multiple sites without organ or systems involvement ([place]) 09/16/2008   Hyperlipidemia LDL goal <100 05/23/2008   Essential hypertension, benign 05/23/2008    Past Medical History:  Diagnosis Date   Acid reflux    Acute cystitis with hematuria 04/11/2011   Anemia    ARF (acute renal failure) ([place]) 12/14/2010   Arthritis    CKD (chronic kidney disease), stage II    previously seen by nephrologist--- dr Florene Glen,  pt released per lov in epic 09-24-2017   Diabetes mellitus, type II ([place])    Diverticulosis of colon    Essential hypertension, benign    followed by pcp   (pt had nuclear stress test (in epic under media) dated 05-25-2008 showed normal perfusion w/ no evidence ischemia, ef 83%   Heart disease     History of bradycardia    previously seen by cardiologist, dr branch, pt released per lov 08-11-2018 in epic, bradycardia resolved   History of sepsis    04-03-2019 hospital admission w/ urosepsis due to obstructive uropathy due to right ureter stone   Hypokalemia    Lipoma of colon    ascending   Lumbar spondylosis    Mixed hyperlipidemia    Nephrolithiasis    per CT 04-03-2019 nonobstructive bilateral stones   Ocular herpes    Osteoarthritis    followed by dr Estanislado Pandy--- hands, shoulders, feet   Osteoporosis    Rheumatoid arthritis(714.0)    rheumotologist-- dr s. Estanislado Pandy---  multiple sites, treated with methotrexate/ prednisone   Right nephrolithiasis-1.4cm Moderate right-sided urinary tract obstruction/hydronephrosis 04/03/2019   Right ureteral calculus    Seasonal allergies    Sepsis ([place]) 04/03/2019   Sepsis secondary to UTI /Nephrolithiasis with obstructive uropathy 04/03/2019   SUI (stress urinary incontinence, female)    Uterine fibroid     Family History  Problem Relation Age of Onset   Stroke Mother    Cancer Father    Diabetes Sister    Diabetes Sister    Kidney failure Brother    Kidney failure Brother    Pancreatic cancer Sister    Kidney Stones Sister        Only one Kidney   Arthritis Sister    Heart disease Daughter    Past Surgical History:  Procedure Laterality Date   BUNIONECTOMY Right 2012   CATARACT EXTRACTION W/ INTRAOCULAR LENS  IMPLANT, BILATERAL  2009   CHOLECYSTECTOMY  12/17/2010   Procedure: LAPAROSCOPIC CHOLECYSTECTOMY;  Surgeon: Donato Heinz;  Location: [facility];  Service: General;  Laterality: N/A;   COLONOSCOPY  05/22/2011   Procedure: COLONOSCOPY;  Surgeon: Rogene Houston, MD;  Location: [facility];  Service: Endoscopy;  Laterality: N/A;  [place], URETEROSCOPY AND STENT PLACEMENT Right 05/09/2019   Procedure: CYSTOSCOPY WITH RETROGRADE PYELOGRAM, URETEROSCOPY AND STENT PLACEMENT;  Surgeon: Robley Fries, MD;  Location[facility];  Service: Urology;  Laterality: Right;  1 HR   CYSTOSCOPY WITH STENT PLACEMENT Right 04/03/2019   Procedure: CYSTOSCOPY, RIGHT RETROGRADE PYELOGRAM  WITH STENT PLACEMENT;  Surgeon: Robley Fries, MD;  Location: [facility];  Service: Urology;  Laterality: Right;   EXCISION BONE CYST Right 2010   RIGHT SHOULDER   GANGLION CYST EXCISION Left 06/02/2012   Procedure: Excision ganglion left dorsal wrist;  Surgeon: Sanjuana Kava, MD;  Location: [facility];  Service: Orthopedics;  Laterality: Left;   HOLMIUM LASER APPLICATION Right 03/07/2023   Procedure: HOLMIUM LASER APPLICATION;  Surgeon: Robley Fries, MD;  Location: [facility];  Service: Urology;  Laterality: Right;   kidney stones     LUMBAR MICRODISCECTOMY  07-16-2000   '@[facility]'$    L 2  -3   POSTERIOR LAMINECTOMY / DECOMPRESSION LUMBAR SPINE  06-26-2009   '@[facility]'$    w/ LATERAL MASS FUSION , L3 -- 5   SHOULDER ARTHROSCOPY WITH DISTAL CLAVICLE RESECTION Right 01-10-2009   '@[facility]'$    w/ DEBRIDEMENT AND EXCISION RECURRENT  GANGLION CYST   Social History   Social History Narrative   Husband passed away 04-12-16. It has been financially harder to pay the bills since his passing, but she is making it. Has grandchildren around which makes it easier.    Immunization History  Administered Date(s) Administered   Fluad Quad(high Dose 65+) 10/19/2019   Influenza Split 11/07/2013   Influenza Whole 10/28/2009, 09/17/2010   Influenza, High Dose Seasonal PF 09/29/2016, 09/15/2018   Influenza,inj,Quad PF,6+ Mos 09/03/2014   Influenza-Unspecified 10/05/2013, 09/29/2016   Moderna Sars-Covid-2 Vaccination 01/17/2019, 02/27/2019, 10/30/2019   PPD Test 05/14/2014, 05/28/2015   Pneumococcal Conjugate-13 08/03/2013   Pneumococcal Polysaccharide-23 08/28/2009   Tdap 04/29/2010     Objective: Vital Signs: BP 119/61 (BP Location: Left Arm, Patient Position: Sitting, Cuff Size: Normal)   Pulse (!) 51    Resp 13   Ht '4\' 11"'$  (1.499 m)   Wt 131 lb (59.4 kg)   BMI 26.46 kg/m    Physical Exam Vitals and nursing note reviewed.  Constitutional:      Appearance: She is well-developed.  HENT:     Head: Normocephalic and atraumatic.  Eyes:     Conjunctiva/sclera: Conjunctivae normal.  Cardiovascular:     Rate and Rhythm: Normal rate and regular rhythm.     Heart sounds: Normal heart sounds.  Pulmonary:     Effort: Pulmonary effort is normal.     Breath sounds: Normal breath sounds.  Abdominal:     General: Bowel sounds are normal.     Palpations: Abdomen is soft.  Musculoskeletal:     Cervical back: Normal range of motion.  Lymphadenopathy:     Cervical: No cervical adenopathy.  Skin:    General: Skin is warm and dry.     Capillary Refill: Capillary refill takes less than 2 seconds.  Neurological:     Mental Status: She is alert and oriented to person, place, and time.  Psychiatric:        Behavior: Behavior normal.      Musculoskeletal Exam: She had limited lateral rotation of the cervical spine.  Shoulder and abduction was limited to 30 degrees bilaterally.  Elbow joints and wrist joints with good range of motion.  She had bilateral PIP and DIP thickening.  She had bilateral CMC thickening.  She had bilateral MCP thickening without any active synovitis.  Hip joints were difficult to assess in the sitting position.  Knee joints were in good range of motion without any warmth swelling or effusion.  There was no tenderness over ankles or MTPs.  CDAI Exam: CDAI Score: -- Patient Global: 1 mm; Provider Global: 2 mm Swollen: --; Tender: -- Joint Exam 11/06/2021   No joint exam has been documented for this visit   There is currently no information documented on the homunculus. Go to the Rheumatology activity and complete the homunculus joint exam.  Investigation: No additional findings.  Imaging: No results found.  Recent Labs: Lab Results  Component Value Date   WBC 5.3  06/19/2021   HGB 13.4 06/19/2021   PLT 206 06/19/2021   NA 143 06/19/2021   K 4.0 06/19/2021   CL 106 06/19/2021   CO2 29 06/19/2021   GLUCOSE 97 06/19/2021   BUN 21 06/19/2021   CREATININE 0.89 06/19/2021   BILITOT 0.7 06/19/2021   ALKPHOS 67 03/13/2021   AST 14 06/19/2021   ALT 7 06/19/2021   PROT 6.5 06/19/2021   ALBUMIN 4.3 03/13/2021   CALCIUM 10.1 08/04/2021  GFRAA 70 03/12/2020    Speciality Comments: No specialty comments available.  Procedures:  No procedures performed Allergies: Aspirin and Sudafed [pseudoephedrine hcl]   Assessment / Plan:     Visit Diagnoses: Rheumatoid arthritis with rheumatoid factor of multiple sites without organ or systems involvement ([place]) - longstanding history of rheumatoid arthritis.  She has been tolerating methotrexate and prednisone combination well.  She denies any joint pain or joint swelling.  She denies any side effects from the medications.  High risk medication use - Methotrexate 3 tablets by mouth every 7 days, folic acid 1 mg daily, and prednisone 1 mg 4 tablets daily. - Plan: CBC with Differential/Platelet, COMPLETE METABOLIC PANEL WITH GFR today and then every 3 months to monitor for drug toxicity.  Information on immunization was placed in the AVS.  She was advised to hold methotrexate if she develops an infection and resume after the infection resolves.  Primary osteoarthritis of both hands -she denies any discomfort in her hands.  She does have severe osteoarthritis and rheumatoid arthritis overlap.  X-rays are consistent with severe osteoarthritis and rheumatoid arthritis overlap.   Primary osteoarthritis of both shoulders-she had limited abduction of bilateral shoulder joints up to 30 degrees bilaterally.  She states she modifies the way she gets stressed.  Primary osteoarthritis of both knees-she had good range of motion of bilateral knee joints.  She has occasional discomfort in her right knee joint.  She ambulates with the  help of a cane.  Primary osteoarthritis of both feet-she had good range of motion of bilateral ankles.  There was no tenderness over MTPs.  She has severe osteoarthritis and rheumatoid arthritis in her feet based on previous radiographic examination.  Spondylosis of lumbar region without myelopathy or radiculopathy-she had no point tenderness over the lumbar spine.  She denies any radiculopathy.  Age-related osteoporosis without current pathological fracture - DEXA ordered on 07/31/19 by Dr. Moshe Cipro: Left femur Neck:  Osteopenia T-score -2.0, BMD 0.764 g/cm2.  She is on alendronate 70 mg p.o. weekly.  Use of calcium rich diet and vitamin D was discussed.  Regular exercise was emphasized.  Type 2 diabetes, diet controlled ([place])  Essential hypertension-blood pressure was normal today.  Systolic murmur  Orders: Orders Placed This Encounter  Procedures   CBC with Differential/Platelet   COMPLETE METABOLIC PANEL WITH GFR   No orders of the defined types were placed in this encounter.    Follow-Up Instructions: Return in about 5 months (around 04/07/2022) for Rheumatoid arthritis, Osteoarthritis, Osteoporosis.   Bo Merino, MD  Note - This record has been created using Editor, commissioning.  Chart creation errors have been sought, but may not always  have been located. Such creation errors do not reflect on  the standard of medical care.

## 2021-10-24 ENCOUNTER — Other Ambulatory Visit: Payer: Self-pay | Admitting: Physician Assistant

## 2021-10-24 ENCOUNTER — Other Ambulatory Visit: Payer: Self-pay | Admitting: Family Medicine

## 2021-10-24 DIAGNOSIS — E1169 Type 2 diabetes mellitus with other specified complication: Secondary | ICD-10-CM

## 2021-10-24 DIAGNOSIS — R7989 Other specified abnormal findings of blood chemistry: Secondary | ICD-10-CM

## 2021-10-24 DIAGNOSIS — E559 Vitamin D deficiency, unspecified: Secondary | ICD-10-CM

## 2021-10-24 DIAGNOSIS — E785 Hyperlipidemia, unspecified: Secondary | ICD-10-CM

## 2021-10-24 DIAGNOSIS — I10 Essential (primary) hypertension: Secondary | ICD-10-CM

## 2021-10-24 DIAGNOSIS — M0579 Rheumatoid arthritis with rheumatoid factor of multiple sites without organ or systems involvement: Secondary | ICD-10-CM

## 2021-10-28 ENCOUNTER — Encounter: Payer: Medicare HMO | Admitting: Family Medicine

## 2021-11-06 ENCOUNTER — Ambulatory Visit: Payer: Medicare PPO | Attending: Rheumatology | Admitting: Rheumatology

## 2021-11-06 ENCOUNTER — Encounter: Payer: Self-pay | Admitting: Rheumatology

## 2021-11-06 VITALS — BP 119/61 | HR 51 | Resp 13 | Ht 59.0 in | Wt 131.0 lb

## 2021-11-06 DIAGNOSIS — R011 Cardiac murmur, unspecified: Secondary | ICD-10-CM

## 2021-11-06 DIAGNOSIS — M19012 Primary osteoarthritis, left shoulder: Secondary | ICD-10-CM

## 2021-11-06 DIAGNOSIS — M170 Bilateral primary osteoarthritis of knee: Secondary | ICD-10-CM | POA: Diagnosis not present

## 2021-11-06 DIAGNOSIS — M19071 Primary osteoarthritis, right ankle and foot: Secondary | ICD-10-CM | POA: Diagnosis not present

## 2021-11-06 DIAGNOSIS — M810 Age-related osteoporosis without current pathological fracture: Secondary | ICD-10-CM | POA: Diagnosis not present

## 2021-11-06 DIAGNOSIS — M0579 Rheumatoid arthritis with rheumatoid factor of multiple sites without organ or systems involvement: Secondary | ICD-10-CM | POA: Diagnosis not present

## 2021-11-06 DIAGNOSIS — M19041 Primary osteoarthritis, right hand: Secondary | ICD-10-CM | POA: Diagnosis not present

## 2021-11-06 DIAGNOSIS — M19011 Primary osteoarthritis, right shoulder: Secondary | ICD-10-CM | POA: Diagnosis not present

## 2021-11-06 DIAGNOSIS — I10 Essential (primary) hypertension: Secondary | ICD-10-CM

## 2021-11-06 DIAGNOSIS — Z79899 Other long term (current) drug therapy: Secondary | ICD-10-CM | POA: Diagnosis not present

## 2021-11-06 DIAGNOSIS — M47816 Spondylosis without myelopathy or radiculopathy, lumbar region: Secondary | ICD-10-CM

## 2021-11-06 DIAGNOSIS — M19072 Primary osteoarthritis, left ankle and foot: Secondary | ICD-10-CM

## 2021-11-06 DIAGNOSIS — E119 Type 2 diabetes mellitus without complications: Secondary | ICD-10-CM | POA: Diagnosis not present

## 2021-11-06 DIAGNOSIS — M19042 Primary osteoarthritis, left hand: Secondary | ICD-10-CM

## 2021-11-06 NOTE — Patient Instructions (Signed)
Standing Labs We placed an order today for your standing lab work.   Please have your standing labs drawn in February and every 3 months  Please have your labs drawn 2 weeks prior to your appointment so that the provider can discuss your lab results at your appointment.  Please note that you may see your imaging and lab results in MyChart before we have reviewed them. We will contact you once all results are reviewed. Please allow our office up to 72 hours to thoroughly review all of the results before contacting the office for clarification of your results.  Lab hours are:   Monday through Thursday from 8:00 am -12:30 pm and 1:00 pm-5:00 pm and Friday from 8:00 am-12:00 pm.  Please be advised, all patients with office appointments requiring lab work will take precedent over walk-in lab work.   Labs are drawn by [facility]. Please bring your co-pay at the time of your lab draw.  You may receive a bill from [facility] for your lab work.  Please note if you are on Hydroxychloroquine and and an order has been placed for a Hydroxychloroquine level, you will need to have it drawn 4 hours or more after your last dose.  If you wish to have your labs drawn at another location, please call the office 24 hours in advance so we can fax the orders.  The office is located at [address], [address], [place], [place] [address] No appointment is necessary.    If you have any questions regarding directions or hours of operation,  please call [phone].   As a reminder, please drink plenty of water prior to coming for your lab work. Thanks!   Vaccines You are taking a medication(s) that can suppress your immune system.  The following immunizations are recommended: Flu annually Covid-19  Td/Tdap (tetanus, diphtheria, pertussis) every 10 years Pneumonia (Prevnar 15 then Pneumovax 23 at least 1 year apart.  Alternatively, can take Prevnar 20 without needing additional dose) Shingrix: 2 doses from 4 weeks  to 6 months apart  Please check with your PCP to make sure you are up to date.   If you have signs or symptoms of an infection or start antibiotics: First, call your PCP for workup of your infection. Hold your medication through the infection, until you complete your antibiotics, and until symptoms resolve if you take the following: Injectable medication (Actemra, Benlysta, Cimzia, Cosentyx, Enbrel, Humira, Kevzara, Orencia, Remicade, Simponi, Stelara, Taltz, Tremfya) Methotrexate Leflunomide (Arava) Mycophenolate (Cellcept) Xeljanz, Olumiant, or Rinvoq  

## 2021-11-07 LAB — COMPLETE METABOLIC PANEL WITH GFR
AG Ratio: 1.7 (calc) (ref 1.0–2.5)
ALT: 7 U/L (ref 6–29)
AST: 15 U/L (ref 10–35)
Albumin: 4.2 g/dL (ref 3.6–5.1)
Alkaline phosphatase (APISO): 51 U/L (ref 37–153)
BUN/Creatinine Ratio: 18 (calc) (ref 6–22)
BUN: 17 mg/dL (ref 7–25)
CO2: 27 mmol/L (ref 20–32)
Calcium: 10.2 mg/dL (ref 8.6–10.4)
Chloride: 107 mmol/L (ref 98–110)
Creat: 0.96 mg/dL — ABNORMAL HIGH (ref 0.60–0.95)
Globulin: 2.5 g/dL (calc) (ref 1.9–3.7)
Glucose, Bld: 130 mg/dL — ABNORMAL HIGH (ref 65–99)
Potassium: 4.3 mmol/L (ref 3.5–5.3)
Sodium: 143 mmol/L (ref 135–146)
Total Bilirubin: 0.6 mg/dL (ref 0.2–1.2)
Total Protein: 6.7 g/dL (ref 6.1–8.1)
eGFR: 57 mL/min/{1.73_m2} — ABNORMAL LOW (ref >=60)

## 2021-11-07 LAB — CBC WITH DIFFERENTIAL/PLATELET
Absolute Monocytes: 291 cells/uL (ref 200–950)
Basophils Absolute: 28 cells/uL (ref 0–200)
Basophils Relative: 0.4 %
Eosinophils Absolute: 7 cells/uL — ABNORMAL LOW (ref 15–500)
Eosinophils Relative: 0.1 %
HCT: 38.5 % (ref 35.0–45.0)
Hemoglobin: 13.3 g/dL (ref 11.7–15.5)
Lymphs Abs: 866 cells/uL (ref 850–3900)
MCH: 31.8 pg (ref 27.0–33.0)
MCHC: 34.5 g/dL (ref 32.0–36.0)
MCV: 92.1 fL (ref 80.0–100.0)
MPV: 9.4 fL (ref 7.5–12.5)
Monocytes Relative: 4.1 %
Neutro Abs: 5907 cells/uL (ref 1500–7800)
Neutrophils Relative %: 83.2 %
Platelets: 270 10*3/uL (ref 140–400)
RBC: 4.18 10*6/uL (ref 3.80–5.10)
RDW: 13.1 % (ref 11.0–15.0)
Total Lymphocyte: 12.2 %
WBC: 7.1 10*3/uL (ref 3.8–10.8)

## 2021-11-07 NOTE — Progress Notes (Signed)
CBC is normal.  Creatinine is mildly elevated and stable.  Glucose is mildly elevated.  Please forward results to her PCP.

## 2021-12-31 ENCOUNTER — Ambulatory Visit (HOSPITAL_COMMUNITY)
Admission: RE | Admit: 2021-12-31 | Discharge: 2021-12-31 | Disposition: A | Discharge status: Home / Self Care | Payer: Medicare PPO | Source: Ambulatory Visit | Attending: Urology | Admitting: Urology

## 2021-12-31 DIAGNOSIS — N200 Calculus of kidney: Secondary | ICD-10-CM | POA: Diagnosis not present

## 2021-12-31 DIAGNOSIS — N281 Cyst of kidney, acquired: Secondary | ICD-10-CM | POA: Insufficient documentation

## 2021-12-31 DIAGNOSIS — Z87442 Personal history of urinary calculi: Secondary | ICD-10-CM | POA: Diagnosis not present

## 2022-01-08 ENCOUNTER — Telehealth: Payer: Self-pay

## 2022-01-08 NOTE — Telephone Encounter (Signed)
Patient was scheduled 01/09/2022 and had to be rescheduled due to Dr. Jeffie Pollock is only here on Thursday. Made patient's granddaughter Stacy Meyers  aware that patient needs a KUB before appt. With Dr.Wrenn 02/05/2022. Granddaughter Stacy Meyers voiced understanding

## 2022-01-09 ENCOUNTER — Ambulatory Visit: Payer: Medicare PPO | Admitting: Urology

## 2022-01-22 ENCOUNTER — Encounter: Payer: Self-pay | Admitting: Family Medicine

## 2022-01-22 ENCOUNTER — Ambulatory Visit (INDEPENDENT_AMBULATORY_CARE_PROVIDER_SITE_OTHER): Payer: Medicare PPO | Admitting: Family Medicine

## 2022-01-22 VITALS — BP 130/70 | HR 55 | Ht 59.5 in | Wt 132.0 lb

## 2022-01-22 DIAGNOSIS — Z8673 Personal history of transient ischemic attack (TIA), and cerebral infarction without residual deficits: Secondary | ICD-10-CM

## 2022-01-22 DIAGNOSIS — E785 Hyperlipidemia, unspecified: Secondary | ICD-10-CM | POA: Diagnosis not present

## 2022-01-22 DIAGNOSIS — I679 Cerebrovascular disease, unspecified: Secondary | ICD-10-CM

## 2022-01-22 DIAGNOSIS — R7303 Prediabetes: Secondary | ICD-10-CM | POA: Diagnosis not present

## 2022-01-22 DIAGNOSIS — Z0001 Encounter for general adult medical examination with abnormal findings: Secondary | ICD-10-CM

## 2022-01-22 DIAGNOSIS — H547 Unspecified visual loss: Secondary | ICD-10-CM | POA: Diagnosis not present

## 2022-01-22 DIAGNOSIS — R9402 Abnormal brain scan: Secondary | ICD-10-CM

## 2022-01-22 DIAGNOSIS — R519 Headache, unspecified: Secondary | ICD-10-CM

## 2022-01-22 DIAGNOSIS — R300 Dysuria: Secondary | ICD-10-CM | POA: Diagnosis not present

## 2022-01-22 LAB — POCT URINALYSIS DIP (CLINITEK)
Bilirubin, UA: NEGATIVE
Glucose, UA: NEGATIVE mg/dL
Leukocytes, UA: NEGATIVE
Nitrite, UA: NEGATIVE
POC PROTEIN,UA: NEGATIVE
Spec Grav, UA: 1.02 (ref 1.010–1.025)
Urobilinogen, UA: 0.2 E.U./dL
pH, UA: 6 (ref 5.0–8.0)

## 2022-01-22 NOTE — Assessment & Plan Note (Signed)
6 week history on avg 4 per week, frontal and right supraorbital, obtain ct scan

## 2022-01-22 NOTE — Assessment & Plan Note (Signed)
Right eye has significantly reduced vision,pt on methotrexate, refer for eye exam

## 2022-01-22 NOTE — Assessment & Plan Note (Signed)
Annual exam as documented.

## 2022-01-22 NOTE — Progress Notes (Signed)
Stacy Meyers     MRN: [id number]      DOB: Jul 23, 1932  HPI: Patient is in for annual physical exam. 6  Week h/o frontal and right sided headache , on average 4 times per week, no new weakness or numbness, no known falls H/o recurrent asymptomatic UTI, will check CCUA, does c/o dysuria, mild x 2 days Will provide form for parking in handicap lot , has severe rheumatoid and osteoarthritis Recent labs,  are reviewed. Immunization is reviewed , and  is up to date   PE: BP 130/70   Pulse (!) 55   Ht 4' 11.5" (1.511 m)   Wt 132 lb (59.9 kg)   SpO2 95%   BMI 26.21 kg/m   Pleasant  female, alert and oriented x 3, in no cardio-pulmonary distress. Afebrile. HEENT No facial trauma or asymetry. Sinuses non tender.  Extra occullar muscles intact.. External ears normal, . Neck: decreased ROM, no adenopathy,JVD or thyromegaly.No bruits.  Chest: Clear to ascultation bilaterally.No crackles or wheezes. Non tender to palpation Heart sounds normal,  S1 and  S2 ,no S3.  Systolic  murmur, no thrill. Apical beat not displaced Peripheral pulses normal.  Abdomen: Soft, non tender, no organomegaly or masses. No bruits. Bowel sounds normal. No guarding, tenderness or rebound.    Musculoskeletal exam: Decreased  ROM of spine, hips , shoulders and knees. deformity ,swelling and  crepitus noted. No muscle wasting or atrophy.   Neurologic: Cranial nerves 2 to 12 intact. Power, tone ,sensation  normal throughout. No disturbance in gait. No tremor.  Skin: Intact, no ulceration, erythema , scaling or rash noted. Pigmentation normal throughout  Psych; Normal mood and affect. Judgement and concentration normal   Assessment & Plan:  Encounter for Medicare annual examination with abnormal findings Annual exam as documented.  Hyperlipidemia LDL goal <100 Hyperlipidemia:Low fat diet discussed and encouraged.   Lipid Panel  Lab Results  Component Value Date   CHOL 149 03/13/2021    HDL 60 03/13/2021   LDLCALC 68 03/13/2021   TRIG 120 03/13/2021   CHOLHDL 2.5 03/13/2021     Updated lab needed at/ before next visit.   Prediabetes Patient educated about the importance of limiting  Carbohydrate intake , the need to commit to daily physical activity for a minimum of 30 minutes , and to commit weight loss. The fact that changes in all these areas will reduce or eliminate all together the development of diabetes is stressed.      Latest Ref Rng & Units 11/06/2021    1:39 PM 06/19/2021   11:49 AM 03/13/2021   10:51 AM 01/15/2021    9:35 AM 10/24/2020   10:43 AM  Diabetic Labs  HbA1c 4.8 - 5.6 %   6.2   6.1   Chol 100 - 199 mg/dL   149   157   HDL >39 mg/dL   60   59   Calc LDL 0 - 99 mg/dL   68   80   Triglycerides 0 - 149 mg/dL   120   100   Creatinine 0.60 - 0.95 mg/dL 0.96  0.89  0.94  1.05  0.92       01/22/2022    4:25 PM 01/22/2022    3:54 PM 01/22/2022    3:52 PM 11/06/2021    1:16 PM 08/06/2021   11:17 AM 06/19/2021   11:24 AM 05/26/2021    9:17 AM  BP/Weight  Systolic BP 297 989 211 941 131  737 366  Diastolic BP 70 65 74 61 80 62 78  Wt. (Lbs)   132 131 131 128 134.4  BMI   26.21 kg/m2 26.46 kg/m2 26.46 kg/m2 25.85 kg/m2 26.69 kg/m2      Latest Ref Rng & Units 11/22/2019   12:00 AM 05/08/2019    8:00 AM  Foot/eye exam completion dates  Eye Exam No Retinopathy No Retinopathy       Foot Form Completion   Done     This result is from an external source.    Updated lab needed at/ before next visit.   Dysuria CCUA abn send for c/s , has h/o recurrent asymptomatic UTI  Reduced vision Right eye has significantly reduced vision,pt on methotrexate, refer for eye exam  New onset of headaches 6 week history on avg 4 per week, frontal and right supraorbital, obtain ct scan

## 2022-01-22 NOTE — Patient Instructions (Addendum)
F/U in 4.5 monhts, call if you need me sooner  CCUA and send for c/s if abnormal  Lipid, hBA1C today  You are being referred to Dr Hadley Pen for eye exam  You are being referred for scan of brain due to 6 week h/o right sided headaches  Medications are sent in, please check your pharmacy.  Handicap sticker today, but you can get permanent sticker  Careful not to fall  Thanks for choosing [facility], we consider it a privelige to serve you.

## 2022-01-22 NOTE — Assessment & Plan Note (Signed)
CCUA abn send for c/s , has h/o recurrent asymptomatic UTI

## 2022-01-22 NOTE — Assessment & Plan Note (Signed)
Hyperlipidemia:Low fat diet discussed and encouraged.   Lipid Panel  Lab Results  Component Value Date   CHOL 149 03/13/2021   HDL 60 03/13/2021   LDLCALC 68 03/13/2021   TRIG 120 03/13/2021   CHOLHDL 2.5 03/13/2021     Updated lab needed at/ before next visit.

## 2022-01-22 NOTE — Assessment & Plan Note (Signed)
Patient educated about the importance of limiting  Carbohydrate intake , the need to commit to daily physical activity for a minimum of 30 minutes , and to commit weight loss. The fact that changes in all these areas will reduce or eliminate all together the development of diabetes is stressed.      Latest Ref Rng & Units 11/06/2021    1:39 PM 06/19/2021   11:49 AM 03/13/2021   10:51 AM 01/15/2021    9:35 AM 10/24/2020   10:43 AM  Diabetic Labs  HbA1c 4.8 - 5.6 %   6.2   6.1   Chol 100 - 199 mg/dL   149   157   HDL >39 mg/dL   60   59   Calc LDL 0 - 99 mg/dL   68   80   Triglycerides 0 - 149 mg/dL   120   100   Creatinine 0.60 - 0.95 mg/dL 0.96  0.89  0.94  1.05  0.92       01/22/2022    4:25 PM 01/22/2022    3:54 PM 01/22/2022    3:52 PM 11/06/2021    1:16 PM 08/06/2021   11:17 AM 06/19/2021   11:24 AM 05/26/2021    9:17 AM  BP/Weight  Systolic BP 381 829 937 169 678 938 101  Diastolic BP 70 65 74 61 80 62 78  Wt. (Lbs)   132 131 131 128 134.4  BMI   26.21 kg/m2 26.46 kg/m2 26.46 kg/m2 25.85 kg/m2 26.69 kg/m2      Latest Ref Rng & Units 11/22/2019   12:00 AM 05/08/2019    8:00 AM  Foot/eye exam completion dates  Eye Exam No Retinopathy No Retinopathy       Foot Form Completion   Done     This result is from an external source.    Updated lab needed at/ before next visit.

## 2022-01-23 ENCOUNTER — Other Ambulatory Visit: Payer: Self-pay | Admitting: Physician Assistant

## 2022-01-23 LAB — LIPID PANEL
Chol/HDL Ratio: 3.9 ratio (ref 0.0–4.4)
Cholesterol, Total: 236 mg/dL — ABNORMAL HIGH (ref 100–199)
HDL: 61 mg/dL (ref >39)
LDL Chol Calc (NIH): 154 mg/dL — ABNORMAL HIGH (ref 0–99)
Triglycerides: 117 mg/dL (ref 0–149)
VLDL Cholesterol Cal: 21 mg/dL (ref 5–40)

## 2022-01-23 LAB — HEMOGLOBIN A1C
Est. average glucose Bld gHb Est-mCnc: 131 mg/dL
Hgb A1c MFr Bld: 6.2 % — ABNORMAL HIGH (ref 4.8–5.6)

## 2022-01-23 NOTE — Telephone Encounter (Signed)
Next Visit: 04/08/2022  Last Visit: 11/06/2021  Last Fill: 10/13/2021  Dx: Rheumatoid arthritis with rheumatoid factor of multiple sites without organ or systems involvement   Current Dose per office note on 11/06/2021: prednisone 1 mg 4 tablets daily   Okay to refill Prednisone?

## 2022-01-24 LAB — URINE CULTURE

## 2022-01-26 ENCOUNTER — Other Ambulatory Visit: Payer: Self-pay

## 2022-01-26 DIAGNOSIS — E785 Hyperlipidemia, unspecified: Secondary | ICD-10-CM

## 2022-01-26 MED ORDER — ROSUVASTATIN CALCIUM 5 MG PO TABS: 5.0000 mg | ORAL_TABLET | ORAL | 0 refills | Status: DC

## 2022-01-28 ENCOUNTER — Other Ambulatory Visit: Payer: Self-pay | Admitting: Family Medicine

## 2022-01-28 ENCOUNTER — Telehealth: Payer: Self-pay | Admitting: Family Medicine

## 2022-01-28 DIAGNOSIS — R519 Headache, unspecified: Secondary | ICD-10-CM

## 2022-01-28 NOTE — Telephone Encounter (Signed)
Insurance denied CT head. Recommended MRI brain strem without contrast. (This one was approved) Ok to change the Ct order to MRI?

## 2022-02-04 ENCOUNTER — Telehealth: Payer: Self-pay | Admitting: Family Medicine

## 2022-02-04 NOTE — Telephone Encounter (Signed)
Called patient will pick up the placard.

## 2022-02-05 ENCOUNTER — Other Ambulatory Visit: Payer: Self-pay

## 2022-02-05 ENCOUNTER — Encounter: Payer: Self-pay | Admitting: Urology

## 2022-02-05 ENCOUNTER — Ambulatory Visit: Payer: Medicare PPO | Admitting: Urology

## 2022-02-05 VITALS — BP 141/76 | HR 80

## 2022-02-05 DIAGNOSIS — Z87442 Personal history of urinary calculi: Secondary | ICD-10-CM | POA: Diagnosis not present

## 2022-02-05 DIAGNOSIS — N281 Cyst of kidney, acquired: Secondary | ICD-10-CM

## 2022-02-05 DIAGNOSIS — N200 Calculus of kidney: Secondary | ICD-10-CM

## 2022-02-05 DIAGNOSIS — Z87440 Personal history of urinary (tract) infections: Secondary | ICD-10-CM | POA: Diagnosis not present

## 2022-02-05 DIAGNOSIS — N3946 Mixed incontinence: Secondary | ICD-10-CM

## 2022-02-05 LAB — URINALYSIS, ROUTINE W REFLEX MICROSCOPIC
Bilirubin, UA: NEGATIVE
Glucose, UA: NEGATIVE
Ketones, UA: NEGATIVE
Nitrite, UA: NEGATIVE
Specific Gravity, UA: 1.025 (ref 1.005–1.030)
Urobilinogen, Ur: 0.2 mg/dL (ref 0.2–1.0)
pH, UA: 6 (ref 5.0–7.5)

## 2022-02-05 LAB — MICROSCOPIC EXAMINATION: Bacteria, UA: NONE SEEN

## 2022-02-05 NOTE — Progress Notes (Signed)
Subjective: 1. Bilateral renal cysts   2. History of nephrolithiasis   3. Personal history of urinary infection   4. Mixed incontinence     02/05/22: Stacy Meyers returns today in /fu for her history of Nocturia with UUI, UTI's renal stones and renal cysts.  A renal US on 12/31/21 showed no stones and only small cysts. She has had no flank pain or hematuria.  She has urgency and frequency with rare incontinence.   01/09/21: Stacy Meyers returns today in f/u.  She had a culture on 10/24/20 that was negative. She has done well without recent UTI's and she has had no flank pain or hematuria.  She has nocturia x 4-5.  She denies daytime incontinence.   Her UA is unremarkable.  KUB and renal US prior to this visit shows stable renal stones and cysts without obstruction.   07/04/20: Stacy Meyers returns today in f/u.   Her culture on 05/02/20 was negative.   She had a renal US that showed bilateral renal stones without obstruction on 05/18/20:  She had some right back pain that was bruised.  That lasted 2-3 days but has resolved.  She has no dysuria or hematuria.  She has nocturia up to 3x.   She continues to wear a pad.   Her UA has 6-10 WBC and few bacteria.    05/02/20: Stacy Meyers is an 89 yo female who is sent by Dr. Moshe Cipro for recurrent e. Coli UTI's with multiple positive cultures since 2/21 with the last on 03/14/20.   She had e. Coli sepsis in 3/21.   She had a 1.4cm right proximal stone with obstruction in 3/21 she was stented and then had ureteroscopy on 05/09/19.  She had a tethered stent and remembers pulling it out but she never followed up with Dr. Claudia Desanctis after the procedure.  She has had no follow up imaging.  She currently has no dysuria but she has nocturia and frequency with urgency with UUI.  She has had no hematuria. She has occasional intermittent right low back pain.  Her urine was clear on 05/01/20.  ROS:  ROS  Allergies  Allergen Reactions   Aspirin Other (See Comments)    REACTION: burns stomach (uncoated)    Sudafed [Pseudoephedrine Hcl] Other (See Comments)    Increased joint pain    Past Medical History:  Diagnosis Date   Acid reflux    Acute cystitis with hematuria 04/11/2011   Anemia    ARF (acute renal failure) ([place]) 12/14/2010   Arthritis    CKD (chronic kidney disease), stage II    previously seen by nephrologist--- dr Florene Glen,  pt released per lov in epic 09-24-2017   Diabetes mellitus, type II ([place])    Diverticulosis of colon    Essential hypertension, benign    followed by pcp   (pt had nuclear stress test (in epic under media) dated 05-25-2008 showed normal perfusion w/ no evidence ischemia, ef 83%   Heart disease    History of bradycardia    previously seen by cardiologist, dr branch, pt released per lov 08-11-2018 in epic, bradycardia resolved   History of sepsis    04-03-2019 hospital admission w/ urosepsis due to obstructive uropathy due to right ureter stone   Hypokalemia    Lipoma of colon    ascending   Lumbar spondylosis    Mixed hyperlipidemia    Nephrolithiasis    per CT 04-03-2019 nonobstructive bilateral stones   Ocular herpes    Osteoarthritis    followed  by dr deveshwar--- hands, shoulders, feet   Osteoporosis    Rheumatoid arthritis(714.0)    rheumotologist-- dr s. Estanislado Pandy---  multiple sites, treated with methotrexate/ prednisone   Right nephrolithiasis-1.4cm Moderate right-sided urinary tract obstruction/hydronephrosis 04/03/2019   Right ureteral calculus    Seasonal allergies    Sepsis ([place]) 04/03/2019   Sepsis secondary to UTI /Nephrolithiasis with obstructive uropathy 04/03/2019   SUI (stress urinary incontinence, female)    Uterine fibroid     Past Surgical History:  Procedure Laterality Date   BUNIONECTOMY Right 2012   CATARACT EXTRACTION W/ INTRAOCULAR LENS  IMPLANT, BILATERAL  2009   CHOLECYSTECTOMY  12/17/2010   Procedure: LAPAROSCOPIC CHOLECYSTECTOMY;  Surgeon: Donato Heinz;  Location: [facility];  Service: General;  Laterality: N/A;    COLONOSCOPY  05/22/2011   Procedure: COLONOSCOPY;  Surgeon: Rogene Houston, MD;  Location: [facility];  Service: Endoscopy;  Laterality: N/A;  [place], URETEROSCOPY AND STENT PLACEMENT Right 05/09/2019   Procedure: CYSTOSCOPY WITH RETROGRADE PYELOGRAM, URETEROSCOPY AND STENT PLACEMENT;  Surgeon: Robley Fries, MD;  Location: [facility];  Service: Urology;  Laterality: Right;  1 HR   CYSTOSCOPY WITH STENT PLACEMENT Right 04/03/2019   Procedure: CYSTOSCOPY, RIGHT RETROGRADE PYELOGRAM  WITH STENT PLACEMENT;  Surgeon: Robley Fries, MD;  Location: [facility];  Service: Urology;  Laterality: Right;   EXCISION BONE CYST Right 2010   RIGHT SHOULDER   GANGLION CYST EXCISION Left 06/02/2012   Procedure: Excision ganglion left dorsal wrist;  Surgeon: Sanjuana Kava, MD;  Location: [facility];  Service: Orthopedics;  Laterality: Left;   HOLMIUM LASER APPLICATION Right 01/07/8784   Procedure: HOLMIUM LASER APPLICATION;  Surgeon: Robley Fries, MD;  Location: [facility];  Service: Urology;  Laterality: Right;   kidney stones     LUMBAR MICRODISCECTOMY  07-16-2000   '@[facility]'$    L 2  -3   POSTERIOR LAMINECTOMY / DECOMPRESSION LUMBAR SPINE  06-26-2009   '@[facility]'$    w/ LATERAL MASS FUSION , L3 -- 5   SHOULDER ARTHROSCOPY WITH DISTAL CLAVICLE RESECTION Right 01-10-2009   '@[facility]'$    w/ DEBRIDEMENT AND EXCISION RECURRENT GANGLION CYST    Social History   Socioeconomic History   Marital status: Widowed    Spouse name: Not on file   Number of children: 1   Years of education: Not on file   Highest education level: Not on file  Occupational History   Occupation: Retired    Comment: [employer]  Tobacco Use   Smoking status: Never    Passive exposure: Past   Smokeless tobacco: Never  Vaping Use   Vaping Use: Never used  Substance and Sexual Activity   Alcohol use: No   Drug use: Never   Sexual activity: Not Currently    Birth control/protection:  Post-menopausal  Other Topics Concern   Not on file  Social History Narrative   Husband passed away 04-14-2016. It has been financially harder to pay the bills since his passing, but she is making it. Has grandchildren around which makes it easier.    Social Determinants of Health   Financial Resource Strain: Low Risk  (07/18/2021)   Overall Financial Resource Strain (CARDIA)    Difficulty of Paying Living Expenses: Not hard at all  Food Insecurity: No Food Insecurity (07/18/2021)   Hunger Vital Sign    Worried About Running Out of Food in the Last Year: Never true    Ran Out  of Food in the Last Year: Never true  Transportation Needs: No Transportation Needs (07/18/2021)   PRAPARE - Hydrologist (Medical): No    Lack of Transportation (Non-Medical): No  Physical Activity: Insufficiently Active (07/18/2021)   Exercise Vital Sign    Days of Exercise per Week: 3 days    Minutes of Exercise per Session: 10 min  Stress: No Stress Concern Present (07/17/2020)   [place]    Feeling of Stress : Not at all  Social Connections: Moderately Isolated (07/17/2020)   Social Connection and Isolation Panel [NHANES]    Frequency of Communication with Friends and Family: More than three times a week    Frequency of Social Gatherings with Friends and Family: More than three times a week    Attends Religious Services: More than 4 times per year    Active Member of [employer] or Organizations: No    Attends Archivist Meetings: Never    Marital Status: Widowed  Intimate Partner Violence: Not At Risk (07/17/2020)   Humiliation, Afraid, Rape, and Kick questionnaire    Fear of Current or Ex-Partner: No    Emotionally Abused: No    Physically Abused: No    Sexually Abused: No    Family History  Problem Relation Age of Onset   Stroke Mother    Cancer Father    Diabetes Sister    Diabetes Sister     Kidney failure Brother    Kidney failure Brother    Pancreatic cancer Sister    Kidney Stones Sister        Only one Kidney   Arthritis Sister    Heart disease Daughter     Anti-infectives: Anti-infectives (From admission, onward)    None       Current Outpatient Medications  Medication Sig Dispense Refill   acetaminophen (TYLENOL) 500 MG tablet Take 1,000 mg by mouth every 6 (six) hours as needed (for pain).      acyclovir (ZOVIRAX) 400 MG tablet TAKE 1 TABLET BY MOUTH TWICE A DAY 180 tablet 1   albuterol (VENTOLIN HFA) 108 (90 Base) MCG/ACT inhaler TAKE 2 PUFFS BY MOUTH EVERY 6 HOURS AS NEEDED FOR WHEEZE OR SHORTNESS OF BREATH 18 each 2   alendronate (FOSAMAX) 70 MG tablet TAKE 1 TABLET BY MOUTH EVERY 7 DAYS. TAKE WITH A FULL GLASS OF WATER ON AN EMPTY STOMACH. 12 tablet 1   amLODipine (NORVASC) 5 MG tablet TAKE 1 TABLET BY MOUTH EVERY DAY 90 tablet 0   butalbital-acetaminophen-caffeine (FIORICET) 50-325-40 MG tablet Take one tablet by mouth once daily, as needed, for severe headache 14 tablet 0   folic acid (FOLVITE) 1 MG tablet TAKE 1 TABLET BY MOUTH EVERY DAY 90 tablet 3   hydrochlorothiazide (MICROZIDE) 12.5 MG capsule TAKE 1 CAPSULE BY MOUTH EVERY DAY 90 capsule 1   KLOR-CON M20 20 MEQ tablet TAKE 1 TABLET BY MOUTH EVERY DAY 90 tablet 1   methotrexate (RHEUMATREX) 2.5 MG tablet TAKE 3 TABLETS BY MOUTH EACH WEEK ON THE SAME DAY. 36 tablet 2   predniSONE (DELTASONE) 1 MG tablet TAKE 4 TABLETS (4 MG TOTAL) BY MOUTH DAILY WITH BREAKFAST 360 tablet 0   promethazine-dextromethorphan (PROMETHAZINE-DM) 6.25-15 MG/5ML syrup Take 5 mLs by mouth 4 (four) times daily as needed for cough. 118 mL 0   Propylene Glycol (SYSTANE BALANCE OP) Place 1 drop into both eyes daily as needed (for dry eyes).  rosuvastatin (CRESTOR) 5 MG tablet Take 1 tablet (5 mg total) by mouth every other day. 45 tablet 0   No current facility-administered medications for this visit.     Objective: Vital  signs in last 24 hours: BP (!) 141/76   Pulse 80   Intake/Output from previous day: No intake/output data recorded. Intake/Output this shift: '@IOTHISSHIFT'$ @   Physical Exam  Lab Results:  Results for orders placed or performed in visit on 02/05/22 (from the past 24 hour(s))  Urinalysis, Routine w reflex microscopic     Status: Abnormal   Collection Time: 02/05/22  1:25 PM  Result Value Ref Range   Specific Gravity, UA 1.025 1.005 - 1.030   pH, UA 6.0 5.0 - 7.5   Color, UA Yellow Yellow   Appearance Ur Clear Clear   Leukocytes,UA Trace (A) Negative   Protein,UA 1+ (A) Negative/Trace   Glucose, UA Negative Negative   Ketones, UA Negative Negative   RBC, UA 2+ (A) Negative   Bilirubin, UA Negative Negative   Urobilinogen, Ur 0.2 0.2 - 1.0 mg/dL   Nitrite, UA Negative Negative   Microscopic Examination See below:    Narrative   Performed at:  [facility] [address], [place], [place]  [id number] Lab Director: Mina Marble [place], Phone:  [phone]  Microscopic Examination     Status: Abnormal   Collection Time: 02/05/22  1:25 PM   Urine  Result Value Ref Range   WBC, UA 6-10 (A) 0 - 5 /hpf   RBC, Urine 3-10 (A) 0 - 2 /hpf   Epithelial Cells (non renal) 0-10 0 - 10 /hpf   Bacteria, UA None seen None seen/Few   Narrative   Performed at:  [facility], [place], [place]  [id number] Lab Director: [place], Phone:  [phone]   Recent Results (from the past 2160 hour(s))  Lipid panel     Status: Abnormal   Collection Time: 01/22/22  4:32 PM  Result Value Ref Range   Cholesterol, Total 236 (H) 100 - 199 mg/dL   Triglycerides 117 0 - 149 mg/dL   HDL 61 >39 mg/dL   VLDL Cholesterol Cal 21 5 - 40 mg/dL   LDL Chol Calc (NIH) 154 (H) 0 - 99 mg/dL   Chol/HDL Ratio 3.9 0.0 - 4.4 ratio    Comment:                                   T. Chol/HDL Ratio                                             Men  Women                                1/2 Avg.Risk  3.4    3.3                                   Avg.Risk  5.0    4.4  2X Avg.Risk  9.6    7.1                                3X Avg.Risk 23.4   11.0   Hemoglobin A1c     Status: Abnormal   Collection Time: 01/22/22  4:32 PM  Result Value Ref Range   Hgb A1c MFr Bld 6.2 (H) 4.8 - 5.6 %    Comment:          Prediabetes: 5.7 - 6.4          Diabetes: >6.4          Glycemic control for adults with diabetes: <7.0    Est. average glucose Bld gHb Est-mCnc 131 mg/dL  POCT URINALYSIS DIP (CLINITEK)     Status: Abnormal   Collection Time: 01/22/22  4:42 PM  Result Value Ref Range   Color, UA yellow yellow   Clarity, UA clear clear   Glucose, UA negative negative mg/dL   Bilirubin, UA negative negative   Ketones, POC UA trace (5) (A) negative mg/dL   Spec Grav, UA 1.020 1.010 - 1.025   Blood, UA trace-intact (A) negative   pH, UA 6.0 5.0 - 8.0   POC PROTEIN,UA negative negative, trace   Urobilinogen, UA 0.2 0.2 or 1.0 E.U./dL   Nitrite, UA Negative Negative   Leukocytes, UA Negative Negative  Urine Culture     Status: None   Collection Time: 01/22/22  4:47 PM   Specimen: Urine   UR  Result Value Ref Range   Urine Culture, Routine Final report    Organism ID, Bacteria Comment     Comment: Culture shows less than 10,000 colony forming units of bacteria per milliliter of urine. This colony count is not generally considered to be clinically significant.   Urinalysis, Routine w reflex microscopic     Status: Abnormal   Collection Time: 02/05/22  1:25 PM  Result Value Ref Range   Specific Gravity, UA 1.025 1.005 - 1.030   pH, UA 6.0 5.0 - 7.5   Color, UA Yellow Yellow   Appearance Ur Clear Clear   Leukocytes,UA Trace (A) Negative   Protein,UA 1+ (A) Negative/Trace   Glucose, UA Negative Negative   Ketones, UA Negative Negative   RBC, UA 2+ (A) Negative   Bilirubin, UA Negative Negative   Urobilinogen, Ur 0.2 0.2 - 1.0 mg/dL    Nitrite, UA Negative Negative   Microscopic Examination See below:     Comment: Microscopic was indicated and was performed.  Microscopic Examination     Status: Abnormal   Collection Time: 02/05/22  1:25 PM   Urine  Result Value Ref Range   WBC, UA 6-10 (A) 0 - 5 /hpf   RBC, Urine 3-10 (A) 0 - 2 /hpf   Epithelial Cells (non renal) 0-10 0 - 10 /hpf   Bacteria, UA None seen None seen/Few       BMET No results for input(s): "NA", "K", "CL", "CO2", "GLUCOSE", "BUN", "CREATININE", "CALCIUM" in the last 72 hours. PT/INR No results for input(s): "LABPROT", "INR" in the last 72 hours. ABG No results for input(s): "PHART", "HCO3" in the last 72 hours.  Invalid input(s): "PCO2", "PO2"  Studies/Results:   US RENAL  Result Date: 12/31/2021 CLINICAL DATA:  Nephrolithiasis.  History of renal cysts. EXAM: RENAL / URINARY TRACT ULTRASOUND COMPLETE COMPARISON:  December 23 2020 renal ultrasound FINDINGS: Right Kidney: Renal measurements: 7.6  x 3.7 x 5.0 cm = volume: 74.1 mL. Diffuse increased echogenicity. Cortical thinning. Contains a 12 mm cyst. No follow-up imaging recommended for the cyst. No stones identified. Left Kidney: Renal measurements: 9.0 x 5.0 x 5.3 cm = volume: 124 mL. Diffuse increased echogenicity. Mild cortical thinning. Contains a 3.4 cm cyst. No follow-up imaging recommended for the cyst. No stones identified. Bladder: Appears normal for degree of bladder distention. Other: None. IMPRESSION: Medical renal disease with bilateral cortical thinning and increased echogenicity. No other significant abnormalities. Electronically Signed   By: Dorise Bullion III M.D.   On: 12/31/2021 15:58      Assessment/Plan: Recurrent UTI with prior sepsis.   Her UA has a few WBC and RBC but her last culture was negative.   History of nephrolithiasis but renal US on 12/27 showed no stones but stable renal cysts and cortical atrophy.     Mixed incontinence with nocturia.  Stable and not in need  of therapy.    No orders of the defined types were placed in this encounter.     Orders Placed This Encounter  Procedures   Microscopic Examination   Urinalysis, Routine w reflex microscopic     Return in about 1 year (around 02/06/2023).    CC: Dr. [name].     Irine Seal 02/06/2022 [phone] ID: Stacy Meyers, female   DOB: 26-Feb-1932, 89 y.o.   MRN: [id number]

## 2022-02-06 ENCOUNTER — Ambulatory Visit: Payer: Medicare PPO | Admitting: "Endocrinology

## 2022-02-18 ENCOUNTER — Ambulatory Visit (HOSPITAL_COMMUNITY)
Admission: RE | Admit: 2022-02-18 | Discharge: 2022-02-18 | Disposition: A | Discharge status: Home / Self Care | Payer: Medicare PPO | Source: Ambulatory Visit | Attending: Family Medicine | Admitting: Family Medicine

## 2022-02-18 DIAGNOSIS — R519 Headache, unspecified: Secondary | ICD-10-CM | POA: Insufficient documentation

## 2022-02-18 DIAGNOSIS — H9190 Unspecified hearing loss, unspecified ear: Secondary | ICD-10-CM | POA: Diagnosis not present

## 2022-02-23 NOTE — Addendum Note (Signed)
Addended by: Fayrene Helper on: 02/23/2022 03:33 AM   Modules accepted: Orders

## 2022-03-05 ENCOUNTER — Telehealth: Payer: Self-pay | Admitting: "Endocrinology

## 2022-03-05 ENCOUNTER — Other Ambulatory Visit: Payer: Self-pay

## 2022-03-05 DIAGNOSIS — M810 Age-related osteoporosis without current pathological fracture: Secondary | ICD-10-CM

## 2022-03-05 NOTE — Telephone Encounter (Signed)
New order in chart 

## 2022-03-05 NOTE — Telephone Encounter (Signed)
Labs need to be updated please

## 2022-03-06 DIAGNOSIS — M810 Age-related osteoporosis without current pathological fracture: Secondary | ICD-10-CM | POA: Diagnosis not present

## 2022-03-07 LAB — COMPREHENSIVE METABOLIC PANEL
ALT: 7 IU/L (ref 0–32)
AST: 16 IU/L (ref 0–40)
Albumin/Globulin Ratio: 1.8 (ref 1.2–2.2)
Albumin: 4.4 g/dL (ref 3.7–4.7)
Alkaline Phosphatase: 66 IU/L (ref 44–121)
BUN/Creatinine Ratio: 23 (ref 12–28)
BUN: 20 mg/dL (ref 8–27)
Bilirubin Total: 0.4 mg/dL (ref 0.0–1.2)
CO2: 22 mmol/L (ref 20–29)
Calcium: 10.4 mg/dL — ABNORMAL HIGH (ref 8.7–10.3)
Chloride: 100 mmol/L (ref 96–106)
Creatinine, Ser: 0.86 mg/dL (ref 0.57–1.00)
Globulin, Total: 2.4 g/dL (ref 1.5–4.5)
Glucose: 180 mg/dL — ABNORMAL HIGH (ref 70–99)
Potassium: 3.5 mmol/L (ref 3.5–5.2)
Sodium: 141 mmol/L (ref 134–144)
Total Protein: 6.8 g/dL (ref 6.0–8.5)
eGFR: 65 mL/min/{1.73_m2} (ref >59)

## 2022-03-07 LAB — PTH, INTACT AND CALCIUM: PTH: 45 pg/mL (ref 15–65)

## 2022-03-09 ENCOUNTER — Encounter: Payer: Self-pay | Admitting: "Endocrinology

## 2022-03-09 ENCOUNTER — Ambulatory Visit (INDEPENDENT_AMBULATORY_CARE_PROVIDER_SITE_OTHER): Payer: Medicare PPO | Admitting: "Endocrinology

## 2022-03-09 VITALS — BP 116/54 | HR 60 | Ht 59.5 in | Wt 130.8 lb

## 2022-03-09 DIAGNOSIS — E782 Mixed hyperlipidemia: Secondary | ICD-10-CM

## 2022-03-09 DIAGNOSIS — R7303 Prediabetes: Secondary | ICD-10-CM | POA: Diagnosis not present

## 2022-03-09 DIAGNOSIS — M810 Age-related osteoporosis without current pathological fracture: Secondary | ICD-10-CM

## 2022-03-09 NOTE — Progress Notes (Signed)
03/09/2022         Endocrinology follow-up note   Past Medical History:  Diagnosis Date   Acid reflux    Acute cystitis with hematuria 04/11/2011   Anemia    ARF (acute renal failure) ([place]) 12/14/2010   Arthritis    CKD (chronic kidney disease), stage II    previously seen by nephrologist--- dr Florene Glen,  pt released per lov in epic 09-24-2017   Diabetes mellitus, type II ([place])    Diverticulosis of colon    Essential hypertension, benign    followed by pcp   (pt had nuclear stress test (in epic under media) dated 05-25-2008 showed normal perfusion w/ no evidence ischemia, ef 83%   Heart disease    History of bradycardia    previously seen by cardiologist, dr branch, pt released per lov 08-11-2018 in epic, bradycardia resolved   History of sepsis    04-03-2019 hospital admission w/ urosepsis due to obstructive uropathy due to right ureter stone   Hypokalemia    Lipoma of colon    ascending   Lumbar spondylosis    Mixed hyperlipidemia    Nephrolithiasis    per CT 04-03-2019 nonobstructive bilateral stones   Ocular herpes    Osteoarthritis    followed by dr Estanislado Pandy--- hands, shoulders, feet   Osteoporosis    Rheumatoid arthritis(714.0)    rheumotologist-- dr s. Estanislado Pandy---  multiple sites, treated with methotrexate/ prednisone   Right nephrolithiasis-1.4cm Moderate right-sided urinary tract obstruction/hydronephrosis 04/03/2019   Right ureteral calculus    Seasonal allergies    Sepsis ([place]) 04/03/2019   Sepsis secondary to UTI /Nephrolithiasis with obstructive uropathy 04/03/2019   SUI (stress urinary incontinence, female)    Uterine fibroid    Past Surgical History:  Procedure Laterality Date   BUNIONECTOMY Right 2012   CATARACT EXTRACTION W/ INTRAOCULAR LENS  IMPLANT, BILATERAL  2009   CHOLECYSTECTOMY  12/17/2010   Procedure: LAPAROSCOPIC CHOLECYSTECTOMY;  Surgeon: Donato Heinz;  Location: [facility];  Service:  General;  Laterality: N/A;   COLONOSCOPY  05/22/2011   Procedure: COLONOSCOPY;  Surgeon: Rogene Houston, MD;  Location: [facility];  Service: Endoscopy;  Laterality: N/A;  [place], URETEROSCOPY AND STENT PLACEMENT Right 05/09/2019   Procedure: CYSTOSCOPY WITH RETROGRADE PYELOGRAM, URETEROSCOPY AND STENT PLACEMENT;  Surgeon: Robley Fries, MD;  Location: [facility];  Service: Urology;  Laterality: Right;  1 HR   CYSTOSCOPY WITH STENT PLACEMENT Right 04/03/2019   Procedure: CYSTOSCOPY, RIGHT RETROGRADE PYELOGRAM  WITH STENT PLACEMENT;  Surgeon: Robley Fries, MD;  Location: [facility];  Service: Urology;  Laterality: Right;   EXCISION BONE CYST Right 2010   RIGHT SHOULDER   GANGLION CYST EXCISION Left 06/02/2012   Procedure: Excision ganglion left dorsal wrist;  Surgeon: Sanjuana Kava, MD;  Location: [facility];  Service: Orthopedics;  Laterality: Left;   HOLMIUM LASER APPLICATION Right [id number]   Procedure: HOLMIUM LASER APPLICATION;  Surgeon: Robley Fries, MD;  Location: [facility];  Service: Urology;  Laterality: Right;   kidney stones     LUMBAR MICRODISCECTOMY  07-16-2000   '@[facility]'$    L 2  -3   POSTERIOR LAMINECTOMY / DECOMPRESSION LUMBAR SPINE  06-26-2009   '@[facility]'$    w/ LATERAL MASS FUSION , L3 -- 5   SHOULDER ARTHROSCOPY WITH DISTAL CLAVICLE RESECTION Right 01-10-2009   '@[facility]'$    w/ DEBRIDEMENT AND EXCISION RECURRENT GANGLION CYST   Social History   Socioeconomic History  Marital status: Widowed    Spouse name: Not on file   Number of children: 1   Years of education: Not on file   Highest education level: Not on file  Occupational History   Occupation: Retired    Comment: [employer]  Tobacco Use   Smoking status: Never    Passive exposure: Past   Smokeless tobacco: Never  Vaping Use   Vaping Use: Never used  Substance and Sexual Activity   Alcohol use: No   Drug use: Never   Sexual activity: Not Currently     Birth control/protection: Post-menopausal  Other Topics Concern   Not on file  Social History Narrative   Husband passed away 2016/04/20. It has been financially harder to pay the bills since his passing, but she is making it. Has grandchildren around which makes it easier.    Social Determinants of Health   Financial Resource Strain: Low Risk  (07/18/2021)   Overall Financial Resource Strain (CARDIA)    Difficulty of Paying Living Expenses: Not hard at all  Food Insecurity: No Food Insecurity (07/18/2021)   Hunger Vital Sign    Worried About Running Out of Food in the Last Year: Never true    Ran Out of Food in the Last Year: Never true  Transportation Needs: No Transportation Needs (07/18/2021)   PRAPARE - Hydrologist (Medical): No    Lack of Transportation (Non-Medical): No  Physical Activity: Insufficiently Active (07/18/2021)   Exercise Vital Sign    Days of Exercise per Week: 3 days    Minutes of Exercise per Session: 10 min  Stress: No Stress Concern Present (07/17/2020)   [place]    Feeling of Stress : Not at all  Social Connections: Moderately Isolated (07/17/2020)   Social Connection and Isolation Panel [NHANES]    Frequency of Communication with Friends and Family: More than three times a week    Frequency of Social Gatherings with Friends and Family: More than three times a week    Attends Religious Services: More than 4 times per year    Active Member of [employer] or Organizations: No    Attends Archivist Meetings: Never    Marital Status: Widowed   Outpatient Encounter Medications as of 03/09/2022  Medication Sig   acetaminophen (TYLENOL) 500 MG tablet Take 1,000 mg by mouth every 6 (six) hours as needed (for pain).    acyclovir (ZOVIRAX) 400 MG tablet TAKE 1 TABLET BY MOUTH TWICE A DAY   albuterol (VENTOLIN HFA) 108 (90 Base) MCG/ACT inhaler TAKE 2 PUFFS BY MOUTH  EVERY 6 HOURS AS NEEDED FOR WHEEZE OR SHORTNESS OF BREATH   alendronate (FOSAMAX) 70 MG tablet TAKE 1 TABLET BY MOUTH EVERY 7 DAYS. TAKE WITH A FULL GLASS OF WATER ON AN EMPTY STOMACH.   amLODipine (NORVASC) 5 MG tablet TAKE 1 TABLET BY MOUTH EVERY DAY   butalbital-acetaminophen-caffeine (FIORICET) 50-325-40 MG tablet Take one tablet by mouth once daily, as needed, for severe headache   folic acid (FOLVITE) 1 MG tablet TAKE 1 TABLET BY MOUTH EVERY DAY   hydrochlorothiazide (MICROZIDE) 12.5 MG capsule TAKE 1 CAPSULE BY MOUTH EVERY DAY   KLOR-CON M20 20 MEQ tablet TAKE 1 TABLET BY MOUTH EVERY DAY   methotrexate (RHEUMATREX) 2.5 MG tablet TAKE 3 TABLETS BY MOUTH EACH WEEK ON THE SAME DAY.   predniSONE (DELTASONE) 1 MG tablet TAKE 4 TABLETS (4 MG TOTAL) BY  MOUTH DAILY WITH BREAKFAST   promethazine-dextromethorphan (PROMETHAZINE-DM) 6.25-15 MG/5ML syrup Take 5 mLs by mouth 4 (four) times daily as needed for cough.   Propylene Glycol (SYSTANE BALANCE OP) Place 1 drop into both eyes daily as needed (for dry eyes).    rosuvastatin (CRESTOR) 5 MG tablet Take 1 tablet (5 mg total) by mouth every other day.   No facility-administered encounter medications on file as of 03/09/2022.   ALLERGIES: Allergies  Allergen Reactions   Aspirin Other (See Comments)    REACTION: burns stomach (uncoated)   Sudafed [Pseudoephedrine Hcl] Other (See Comments)    Increased joint pain    VACCINATION STATUS: Immunization History  Administered Date(s) Administered   Fluad Quad(high Dose 65+) 10/19/2019, 10/01/2021   Influenza Split 11/07/2013   Influenza Whole 10/28/2009, 09/17/2010   Influenza, High Dose Seasonal PF 09/29/2016, 09/15/2018   Influenza,inj,Quad PF,6+ Mos 09/03/2014   Influenza-Unspecified 10/05/2013, 09/29/2016   Moderna Sars-Covid-2 Vaccination 01/17/2019, 02/27/2019, 10/30/2019, 10/19/2021   PPD Test 05/14/2014, 05/28/2015   Pneumococcal Conjugate-13 08/03/2013   Pneumococcal Polysaccharide-23  08/28/2009   Tdap 04/29/2010     HPI   Stacy Meyers is 89 y.o. female who presents today for follow-up.  She was seen in consultation for osteoporosis during her last visit. PMD:  Fayrene Helper, MD.  Patient was diagnosed with osteoporosis at approximate age of 89 years.   She denies fractures or falls. No dizziness/vertigo/orthostasis. -She came alone today, walking with a cane for disequilibrium-see notes from prior visit.    She has been on Fosamax for at least 5 years.  She continues to tolerate alendronate.    Review of her last 3 bone density studies show that there has been progressive improvement in her bone density hide bone density was from August 2023, confirming the same findings.  She has no new complaints today.  She continues to have mild hypercalcemia associated with high normal PTH.  She denies any prior history of nephrolithiasis nor parathyroid dysfunction. -Review of her medical records indicate a course of hypercalcemia between January and September 2020 which has resolved before her last visit.    No weight bearing exercises.   No history of thyrotoxicosis, she has no new complaints today. She has normal renal function.  Menopause was at 89 y/o.   -She did not report family history of osteoporosis.  She has well-controlled type 2 diabetes.  Because of her history of rheumatoid arthritis, she is on ongoing methotrexate treatment, and intermittent steroids.  -She reports losing 1 1/2 inches of height over time.  Review of Systems  Limited as above.  Objective:    BP (!) 116/54   Pulse 60   Ht 4' 11.5" (1.511 m)   Wt 130 lb 12.8 oz (59.3 kg)   BMI 25.98 kg/m   Wt Readings from Last 3 Encounters:  03/09/22 130 lb 12.8 oz (59.3 kg)  01/22/22 132 lb (59.9 kg)  11/06/21 131 lb (59.4 kg)    Physical Exam  Constitutional: + normal  weight for height, not in acute distress, normal state of mind, + walks with a cane.   CMP ( most recent) CMP      Component Value Date/Time   NA 141 03/06/2022 1007   K 3.5 03/06/2022 1007   CL 100 03/06/2022 1007   CO2 22 03/06/2022 1007   GLUCOSE 180 (H) 03/06/2022 1007   GLUCOSE 130 (H) 11/06/2021 1339   BUN 20 03/06/2022 1007   CREATININE 0.86 03/06/2022 1007  CREATININE 0.96 (H) 11/06/2021 1339   CALCIUM 10.4 (H) 03/06/2022 1007   PROT 6.8 03/06/2022 1007   ALBUMIN 4.4 03/06/2022 1007   AST 16 03/06/2022 1007   ALT 7 03/06/2022 1007   ALKPHOS 66 03/06/2022 1007   BILITOT 0.4 03/06/2022 1007   GFRNONAA 60 03/12/2020 1102   GFRAA 70 03/12/2020 1102     Diabetic Labs (most recent): Lab Results  Component Value Date   HGBA1C 6.2 (H) 01/22/2022   HGBA1C 6.2 (H) 03/13/2021   HGBA1C 6.1 (H) 10/24/2020   MICROALBUR 2.7 02/01/2019   MICROALBUR 51.9 (H) 12/08/2018   MICROALBUR 12.4 (H) 01/17/2018     Lipid Panel ( most recent) Lipid Panel     Component Value Date/Time   CHOL 236 (H) 01/22/2022 1632   TRIG 117 01/22/2022 1632   HDL 61 01/22/2022 1632   CHOLHDL 3.9 01/22/2022 1632   CHOLHDL 2.6 10/24/2019 1100   VLDL 26 02/24/2016 0832   LDLCALC 154 (H) 01/22/2022 1632   LDLCALC 69 10/24/2019 1100   LABVLDL 21 01/22/2022 1632      Lab Results  Component Value Date   TSH 1.390 03/13/2021   TSH 2.560 02/02/2020   TSH 0.98 10/24/2019   TSH 1.95 01/13/2018   TSH 2.354 01/24/2015   TSH 1.782 07/31/2013   TSH 0.915 04/16/2011   TSH 0.490 12/15/2010   TSH 1.426 03/03/2010   FREET4 1.29 02/02/2020   FREET4 0.97 12/15/2010    Latest Reference Range & Units 01/22/22 16:32 03/06/22 10:07  Glucose 70 - 99 mg/dL  180 (H)  Hemoglobin A1C 4.8 - 5.6 % 6.2 (H)   Est. average glucose Bld gHb Est-mCnc mg/dL 131   PTH, Intact 15 - 65 pg/mL  45  PTH Interp   Comment  (H): Data is abnormally high  Bone density study from August 06, 2021 was reviewed. Assessment: 1. Osteoporosis  Plan: 1. Osteoporosis - likely postmenopausal    Review of her last 3 bone density studies showed  that she is benefiting from treatment with alendronate a T score improving on her radius.     We reviewed her DEXA scans together, and I explained that based on the T scores, she is responding to her alendronate.  She continues to tolerate  her alendronate 70 mg p.o. weekly.  - Side effects and precautions discussed with her, she will be kept on this medication for another year or 2, before she will be considered for drug holiday.    She is advised to not take any supplemental calcium.  She will continue to benefit from low-dose vitamin D supplement.   Her labs continue to show mild hypercalcemia and now much better PTH at 45.     She will not need intervention at this time.  She is not a surgical candidate.  -I discussed with her all fall precautions and avoiding vigorous movements to reduce her risk of fracture.  She is encouraged to continue to use her cane to ambulate.  - we discussed about maintaining a good amount of protein in her diet. -She will have labs including PTH/calcium, CMP, thyroid function test before her next visit in 6 months.  - I advised patient to maintain close follow up with Fayrene Helper, MD for primary care needs.  I spent  21  minutes in the care of the patient today including review of labs from Thyroid Function, CMP, and other relevant labs ; imaging/biopsy records (current and previous including abstractions from other facilities);  face-to-face time discussing  her lab results and symptoms, medications doses, her options of short and long term treatment based on the latest standards of care / guidelines;   and documenting the encounter.  [name]  participated in the discussions, expressed understanding, and voiced agreement with the above plans.  All questions were answered to her satisfaction. she is encouraged to contact clinic should she have any questions or concerns prior to her return visit.    Follow up plan: Return in about 6 months (around  09/09/2022) for Fasting Labs  in AM B4 8.   Glade Lloyd, MD [facility] [address] [place], [place] [address] Phone: [phone]  Fax: [phone]     03/09/2022, 3:40 PM  This note was partially dictated with voice recognition software. Similar sounding words can be transcribed inadequately or may not  be corrected upon review.

## 2022-03-11 ENCOUNTER — Other Ambulatory Visit: Payer: Self-pay | Admitting: Family Medicine

## 2022-03-25 NOTE — Progress Notes (Unsigned)
Office Visit Note  Patient: Stacy Meyers             Date of Birth: 1932-05-28           MRN: [id number]             PCP: Fayrene Helper, MD Referring: Fayrene Helper, MD Visit Date: 04/08/2022 Occupation: @GUAROCC @  Subjective:  Medication monitoring   History of Present Illness: Stacy Meyers is a 89 y.o. female with history of seropositive rheumatoid arthritis and osteoarthritis.  Patient is taking Methotrexate 3 tablets by mouth every 7 days, folic acid 1 mg daily, and prednisone 1 mg 4 tablets daily.  She is tolerating combination therapy without any side effects and has not missed any doses recently.  She denies any recent rheumatoid arthritis flares.  She states that her morning stiffness has been lasting for about 30 minutes daily.  She states that last evening she was having some soreness in the left ankle but states that it is better this morning.  She denies any joint swelling.  She has not any recent injury or fall.  She continues to use a cane to assist with ambulation.  She denies any recent or recurrent infections.  She denies any new medical conditions.  She remains on Fosamax 70 mg 1 tablet by mouth once weekly as prescribed by Dr. Dorris Fetch.      Activities of Daily Living:  Patient reports morning stiffness for 30 minutes.   Patient Denies nocturnal pain.  Difficulty dressing/grooming: Reports Difficulty climbing stairs: Denies Difficulty getting out of chair: Denies Difficulty using hands for taps, buttons, cutlery, and/or writing: Denies  Review of Systems  Constitutional:  Positive for fatigue.  HENT:  Positive for mouth dryness. Negative for mouth sores and nose dryness.   Eyes:  Positive for dryness. Negative for pain and visual disturbance.  Respiratory:  Negative for cough, hemoptysis and difficulty breathing.   Cardiovascular:  Negative for chest pain, palpitations, hypertension and swelling in legs/feet.  Gastrointestinal:  Negative for blood in  stool, constipation and diarrhea.  Endocrine: Positive for increased urination.  Genitourinary:  Positive for involuntary urination. Negative for painful urination.  Musculoskeletal:  Positive for joint pain, gait problem, joint pain, joint swelling and morning stiffness. Negative for myalgias, muscle weakness, muscle tenderness and myalgias.  Skin:  Positive for hair loss. Negative for color change, pallor, rash, nodules/bumps, skin tightness, ulcers and sensitivity to sunlight.  Allergic/Immunologic: Negative for susceptible to infections.  Neurological:  Positive for dizziness and headaches. Negative for numbness and weakness.  Hematological:  Negative for swollen glands.  Psychiatric/Behavioral:  Negative for depressed mood and sleep disturbance. The patient is not nervous/anxious.     PMFS History:  Patient Active Problem List   Diagnosis Date Noted   Encounter for Medicare annual examination with abnormal findings 01/22/2022   Reduced vision 01/22/2022   New onset of headaches 01/22/2022   Age related osteoporosis 02/07/2021   Acute cystitis without hematuria 10/27/2020   Mass of left lower leg 10/24/2020   Headache 08/26/2020   Dysuria 05/03/2020   Prediabetes 10/28/2019   H/o Chronic heart failure with preserved ejection fraction (HFpEF) ([place]) 99991111   Umbilical hernia without obstruction and without gangrene 12/10/2018   Fibroids 05/08/2016   Spondylosis of lumbar region without myelopathy or radiculopathy 12/24/2015   Solitary bone cyst of right shoulder 05/28/2015   Right knee pain 05/28/2015   Osteoarthritis of both shoulders 05/28/2015   Overweight (BMI 25.0-29.9) 04/28/2014  Ganglion cyst of wrist 99991111   Systolic murmur [id number]   Diastolic dysfunction [id number]   Hypercalcemia 12/14/2010   Seasonal allergies 08/28/2010   Herpes simplex virus (HSV) infection 09/16/2008   Rheumatoid arthritis with rheumatoid factor of multiple sites without organ or  systems involvement 09/16/2008   Mixed hyperlipidemia 05/23/2008   Essential hypertension, benign 05/23/2008    Past Medical History:  Diagnosis Date   Acid reflux    Acute cystitis with hematuria 04/11/2011   Anemia    ARF (acute renal failure) 12/14/2010   Arthritis    CKD (chronic kidney disease), stage II    previously seen by nephrologist--- dr Florene Glen,  pt released per lov in epic 09-24-2017   Diabetes mellitus, type II    Diverticulosis of colon    Essential hypertension, benign    followed by pcp   (pt had nuclear stress test (in epic under media) dated 05-25-2008 showed normal perfusion w/ no evidence ischemia, ef 83%   Heart disease    History of bradycardia    previously seen by cardiologist, dr branch, pt released per lov 08-11-2018 in epic, bradycardia resolved   History of sepsis    04-03-2019 hospital admission w/ urosepsis due to obstructive uropathy due to right ureter stone   Hypokalemia    Lipoma of colon    ascending   Lumbar spondylosis    Mixed hyperlipidemia    Nephrolithiasis    per CT 04-03-2019 nonobstructive bilateral stones   Ocular herpes    Osteoarthritis    followed by dr Estanislado Pandy--- hands, shoulders, feet   Osteoporosis    Rheumatoid arthritis(714.0)    rheumotologist-- dr s. Estanislado Pandy---  multiple sites, treated with methotrexate/ prednisone   Right nephrolithiasis-1.4cm Moderate right-sided urinary tract obstruction/hydronephrosis 04/03/2019   Right ureteral calculus    Seasonal allergies    Sepsis 04/03/2019   Sepsis secondary to UTI /Nephrolithiasis with obstructive uropathy 04/03/2019   SUI (stress urinary incontinence, female)    Uterine fibroid     Family History  Problem Relation Age of Onset   Stroke Mother    Cancer Father    Diabetes Sister    Diabetes Sister    Kidney failure Brother    Kidney failure Brother    Pancreatic cancer Sister    Kidney Stones Sister        Only one Kidney   Arthritis Sister    Heart disease  Daughter    Past Surgical History:  Procedure Laterality Date   BUNIONECTOMY Right 2012   CATARACT EXTRACTION W/ INTRAOCULAR LENS  IMPLANT, BILATERAL  2009   CHOLECYSTECTOMY  12/17/2010   Procedure: LAPAROSCOPIC CHOLECYSTECTOMY;  Surgeon: Donato Heinz;  Location: [facility];  Service: General;  Laterality: N/A;   COLONOSCOPY  05/22/2011   Procedure: COLONOSCOPY;  Surgeon: Rogene Houston, MD;  Location: [facility];  Service: Endoscopy;  Laterality: N/A;  [place], URETEROSCOPY AND STENT PLACEMENT Right 05/09/2019   Procedure: CYSTOSCOPY WITH RETROGRADE PYELOGRAM, URETEROSCOPY AND STENT PLACEMENT;  Surgeon: Robley Fries, MD;  Location: [facility];  Service: Urology;  Laterality: Right;  1 HR   CYSTOSCOPY WITH STENT PLACEMENT Right 04/03/2019   Procedure: CYSTOSCOPY, RIGHT RETROGRADE PYELOGRAM  WITH STENT PLACEMENT;  Surgeon: Robley Fries, MD;  Location: [facility];  Service: Urology;  Laterality: Right;   EXCISION BONE CYST Right 2010   RIGHT SHOULDER   GANGLION CYST EXCISION Left 06/02/2012   Procedure: Excision ganglion left dorsal  wrist;  Surgeon: Sanjuana Kava, MD;  Location: [facility];  Service: Orthopedics;  Laterality: Left;   HOLMIUM LASER APPLICATION Right 99991111   Procedure: HOLMIUM LASER APPLICATION;  Surgeon: Robley Fries, MD;  Location: [facility];  Service: Urology;  Laterality: Right;   kidney stones     LUMBAR MICRODISCECTOMY  07-16-2000   @[facility]    L 2  -3   POSTERIOR LAMINECTOMY / DECOMPRESSION LUMBAR SPINE  06-26-2009   @[facility]    w/ LATERAL MASS FUSION , L3 -- 5   SHOULDER ARTHROSCOPY WITH DISTAL CLAVICLE RESECTION Right 01-10-2009   @[facility]    w/ DEBRIDEMENT AND EXCISION RECURRENT GANGLION CYST   Social History   Social History Narrative   Husband passed away April 11, 2016. It has been financially harder to pay the bills since his passing, but she is making it. Has grandchildren around which makes it  easier.    Immunization History  Administered Date(s) Administered   Fluad Quad(high Dose 65+) 10/19/2019, 10/01/2021   Influenza Split 11/07/2013   Influenza Whole 10/28/2009, 09/17/2010   Influenza, High Dose Seasonal PF 09/29/2016, 09/15/2018   Influenza,inj,Quad PF,6+ Mos 09/03/2014   Influenza-Unspecified 10/05/2013, 09/29/2016   Moderna Sars-Covid-2 Vaccination 01/17/2019, 02/27/2019, 10/30/2019, 10/19/2021   PPD Test 05/14/2014, 05/28/2015   Pneumococcal Conjugate-13 08/03/2013   Pneumococcal Polysaccharide-23 08/28/2009   Tdap 04/29/2010     Objective: Vital Signs: BP 119/68 (BP Location: Left Arm, Patient Position: Sitting, Cuff Size: Normal)   Pulse 98   Resp 17   Ht 4\' 11"  (1.499 m)   Wt 133 lb (60.3 kg)   BMI 26.86 kg/m    Physical Exam Vitals and nursing note reviewed.  Constitutional:      Appearance: She is well-developed.  HENT:     Head: Normocephalic and atraumatic.  Eyes:     Conjunctiva/sclera: Conjunctivae normal.  Cardiovascular:     Rate and Rhythm: Normal rate and regular rhythm.     Heart sounds: Murmur heard.  Pulmonary:     Effort: Pulmonary effort is normal.     Breath sounds: Normal breath sounds.  Abdominal:     General: Bowel sounds are normal.     Palpations: Abdomen is soft.  Musculoskeletal:     Cervical back: Normal range of motion.  Skin:    General: Skin is warm and dry.     Capillary Refill: Capillary refill takes less than 2 seconds.  Neurological:     Mental Status: She is alert and oriented to person, place, and time.  Psychiatric:        Behavior: Behavior normal.      Musculoskeletal Exam: C-spine has limited ROM with lateral rotation.  Limited shoulder abduction to about 30 to 40 degrees.  Elbow joints have good range of motion with no tenderness or inflammation.  Wrist joints have good range of motion with synovial thickening.  PIP and DIP thickening.  CMC joint thickening.  Thickening of MCP joints but no active  synovitis or tenderness noted.  Hip joints have good range of motion with no groin pain currently.  Right knee has limited extension but no warmth or effusion.  Crepitus noted in the right knee.  Left knee has good range of motion with no warmth or effusion.  Ankle joints have some thickening.  Some tenderness on the lateral aspect of the left ankle but no synovitis was noted.  CDAI Exam: CDAI Score: -- Patient Global: 7 mm; Provider Global: 2 mm Swollen: --; Tender: -- Joint  Exam 04/08/2022   No joint exam has been documented for this visit   There is currently no information documented on the homunculus. Go to the Rheumatology activity and complete the homunculus joint exam.  Investigation: No additional findings.  Imaging: No results found.  Recent Labs: Lab Results  Component Value Date   WBC 7.1 11/06/2021   HGB 13.3 11/06/2021   PLT 270 11/06/2021   NA 141 03/06/2022   K 3.5 03/06/2022   CL 100 03/06/2022   CO2 22 03/06/2022   GLUCOSE 180 (H) 03/06/2022   BUN 20 03/06/2022   CREATININE 0.86 03/06/2022   BILITOT 0.4 03/06/2022   ALKPHOS 66 03/06/2022   AST 16 03/06/2022   ALT 7 03/06/2022   PROT 6.8 03/06/2022   ALBUMIN 4.4 03/06/2022   CALCIUM 10.4 (H) 03/06/2022   GFRAA 70 03/12/2020    Speciality Comments: No specialty comments available.  Procedures:  No procedures performed Allergies: Aspirin and Sudafed [pseudoephedrine hcl]    Assessment / Plan:     Visit Diagnoses: Rheumatoid arthritis with rheumatoid factor of multiple sites without organ or systems involvement - longstanding history of rheumatoid arthritis: She has no active synovitis on examination today.  She has not had any signs or symptoms of a flare recently.  She has been tolerating methotrexate 3 tablets by mouth once weekly and prednisone 4 mg daily as combination therapy.  Her morning stiffness has been lasting about 30 minutes daily.  She continues to use a cane to assist with ambulation.   Overall her symptoms remain stable on the current treatment regimen.  No medication changes will be made at this time.  Refills of methotrexate and prednisone were sent to the pharmacy today.  She was advised to notify us if she develops signs or symptoms of a flare.  She will follow-up in the office in 5 months or sooner if needed.- Plan: methotrexate (RHEUMATREX) 2.5 MG tablet  High risk medication use - Methotrexate 3 tablets by mouth every 7 days, folic acid 1 mg daily, and prednisone 1 mg 4 tablets daily. CMP updated on 03/06/22.   CBC drawn on 11/06/21. CBC released today.  Her next lab work will be due in June and every 3 months to monitor for drug toxicity.  Standing orders for CBC and CMP released today.   No recent or recurrent infections.  Discussed the importance of holding methotrexate if she develops signs or symptoms of an infection and to resume once the infection has completely cleared.   - Plan: CBC with Differential/Platelet, CBC with Differential/Platelet, COMPLETE METABOLIC PANEL WITH GFR  Primary osteoarthritis of both hands: She has PIP, DIP, and CMC thickening consistent with OA of both hands.    Primary osteoarthritis of both shoulders: Limited ROM.  She has chronic stiffness in both shoulders but no increased pain.  No effusion noted.   Primary osteoarthritis of both knees: Right knee has slightly limited extension with crepitus.  Left knee has good ROM with no warmth or effusion.  She is using a cane to assist with ambulation.  No recent falls.   Primary osteoarthritis of both feet - Severe osteoarthritis and rheumatoid arthritis in her feet based on previous radiographic examination.  She has some tenderness on the lateral aspect of the left ankle but no synovitis was noted.  She was having some soreness in the left ankle last night but her symptoms have improved today.  Spondylosis of lumbar region without myelopathy or radiculopathy: She experiences intermittent discomfort  and stiffness in her lower back.  She uses a cane to assist with ambulation.  She had no midline spinal tenderness at this time.  No symptoms of radiculopathy today.  Age-related osteoporosis without current pathological fracture -  DEXA updated on 08/06/21: The BMD measured at Forearm Radius 33% is 0.569 g/cm2 with a T-score of -2.0.  Previous DEXA ordered on 07/31/19 by Dr. Moshe Cipro: Left femur Neck:  Osteopenia T-score -2.0, BMD 0.764 g/cm2.  She is on alendronate 70 mg p.o. weekly prescribed by Dr. Dorris Fetch.    Other medical conditions are listed as follows:  Type 2 diabetes, diet controlled  Essential hypertension: BP was 119/68 today in the office.   Systolic murmur   Orders: Orders Placed This Encounter  Procedures   CBC with Differential/Platelet   CBC with Differential/Platelet   COMPLETE METABOLIC PANEL WITH GFR   Meds ordered this encounter  Medications   methotrexate (RHEUMATREX) 2.5 MG tablet    Sig: TAKE 3 TABLETS BY MOUTH EACH WEEK ON THE SAME DAY.    Dispense:  36 tablet    Refill:  0   predniSONE (DELTASONE) 1 MG tablet    Sig: TAKE 4 TABLETS (4 MG TOTAL) BY MOUTH DAILY WITH BREAKFAST    Dispense:  360 tablet    Refill:  0      Follow-Up Instructions: Return in about 5 months (around 09/08/2022) for Rheumatoid arthritis, Osteoarthritis.   Ofilia Neas, PA-C  Note - This record has been created using Dragon software.  Chart creation errors have been sought, but may not always  have been located. Such creation errors do not reflect on  the standard of medical care.

## 2022-03-30 ENCOUNTER — Encounter: Payer: Self-pay | Admitting: Neurology

## 2022-03-30 ENCOUNTER — Other Ambulatory Visit: Payer: Self-pay | Admitting: Family Medicine

## 2022-03-30 ENCOUNTER — Ambulatory Visit: Payer: Medicare PPO | Admitting: Neurology

## 2022-03-30 VITALS — BP 153/73 | HR 56 | Ht 59.5 in | Wt 131.0 lb

## 2022-03-30 DIAGNOSIS — I6381 Other cerebral infarction due to occlusion or stenosis of small artery: Secondary | ICD-10-CM | POA: Diagnosis not present

## 2022-03-30 DIAGNOSIS — E785 Hyperlipidemia, unspecified: Secondary | ICD-10-CM | POA: Diagnosis not present

## 2022-03-30 DIAGNOSIS — G44229 Chronic tension-type headache, not intractable: Secondary | ICD-10-CM

## 2022-03-30 MED ORDER — GABAPENTIN 100 MG PO CAPS: 100.0000 mg | ORAL_CAPSULE | Freq: Every day | ORAL | 3 refills | Status: DC

## 2022-03-30 MED ORDER — ROSUVASTATIN CALCIUM 10 MG PO TABS: 10.0000 mg | ORAL_TABLET | Freq: Every evening | ORAL | 3 refills | Status: DC

## 2022-03-30 NOTE — Patient Instructions (Signed)
Start gabapentin 100 mg nightly Continue with Tylenol as needed since it is helpful to control your pain Start aspirin enteric-coated 81 mg daily Increase Crestor to 10 mg nightly Continue your other medications Follow-up in 6 months or sooner if worse

## 2022-03-30 NOTE — Progress Notes (Signed)
[facility]  PATIENT: Stacy Meyers DOB: January 30, 1932  REQUESTING CLINICIAN: Fayrene Helper, MD HISTORY FROM: Patient and granddaughter  REASON FOR VISIT: Headaches    HISTORICAL  CHIEF COMPLAINT:  Chief Complaint  Patient presents with   New Patient (Initial Visit)    Rm 12, grandaughter/greatgrandaughter Headaches: daily, neck pillow relieves pain but she doesn't know what causes them    HISTORY OF PRESENT ILLNESS:  This is a 89 year old woman with multiple medical conditions including hypertension, hyperlipidemia, heart disease and kidney disease who is presenting with complaint of headaches.  Patient reports having headaches for the past 6 months, mainly morning headaches.  Headaches are located in the front of her head and they can last hours.  Tylenol usually take care of the headaches.  She has tried Fioricet when her headaches are severe with good result.  She is not taking the medication daily.  She has not been on any preventive medications.  She did have her MRI of her brain done recently, did not show any acute finding but showed multiple small chronic infarct in the bilateral hemisphere likely small vessel disease.  She is not on a daily aspirin but she is taking 5 mg of Crestor every other day.    OTHER MEDICAL CONDITIONS: Hypertension, Hyperlipidemia, Heart disease, Kidney disease    REVIEW OF SYSTEMS: Full 14 system review of systems performed and negative with exception of: As noted in the HPI   ALLERGIES: Allergies  Allergen Reactions   Aspirin Other (See Comments)    REACTION: burns stomach (uncoated)   Sudafed [Pseudoephedrine Hcl] Other (See Comments)    Increased joint pain    HOME MEDICATIONS: Outpatient Medications Prior to Visit  Medication Sig Dispense Refill   acetaminophen (TYLENOL) 500 MG tablet Take 1,000 mg by mouth every 6 (six) hours as needed (for pain).      acyclovir (ZOVIRAX) 400 MG tablet TAKE 1 TABLET BY MOUTH  TWICE A DAY 180 tablet 1   albuterol (VENTOLIN HFA) 108 (90 Base) MCG/ACT inhaler TAKE 2 PUFFS BY MOUTH EVERY 6 HOURS AS NEEDED FOR WHEEZE OR SHORTNESS OF BREATH 18 each 2   alendronate (FOSAMAX) 70 MG tablet TAKE 1 TABLET BY MOUTH EVERY 7 DAYS. TAKE WITH A FULL GLASS OF WATER ON AN EMPTY STOMACH. 12 tablet 1   amLODipine (NORVASC) 5 MG tablet TAKE 1 TABLET BY MOUTH EVERY DAY 90 tablet 0   butalbital-acetaminophen-caffeine (FIORICET) 50-325-40 MG tablet Take one tablet by mouth once daily, as needed, for severe headache 14 tablet 0   folic acid (FOLVITE) 1 MG tablet TAKE 1 TABLET BY MOUTH EVERY DAY 90 tablet 3   hydrochlorothiazide (MICROZIDE) 12.5 MG capsule TAKE 1 CAPSULE BY MOUTH EVERY DAY 90 capsule 1   KLOR-CON M20 20 MEQ tablet TAKE 1 TABLET BY MOUTH EVERY DAY 90 tablet 1   methotrexate (RHEUMATREX) 2.5 MG tablet TAKE 3 TABLETS BY MOUTH EACH WEEK ON THE SAME DAY. 36 tablet 2   predniSONE (DELTASONE) 1 MG tablet TAKE 4 TABLETS (4 MG TOTAL) BY MOUTH DAILY WITH BREAKFAST 360 tablet 0   promethazine-dextromethorphan (PROMETHAZINE-DM) 6.25-15 MG/5ML syrup Take 5 mLs by mouth 4 (four) times daily as needed for cough. 118 mL 0   Propylene Glycol (SYSTANE BALANCE OP) Place 1 drop into both eyes daily as needed (for dry eyes).      rosuvastatin (CRESTOR) 5 MG tablet Take 1 tablet (5 mg total) by mouth every other day. 45 tablet 0  No facility-administered medications prior to visit.    PAST MEDICAL HISTORY: Past Medical History:  Diagnosis Date   Acid reflux    Acute cystitis with hematuria 04/11/2011   Anemia    ARF (acute renal failure) ([place]) 12/14/2010   Arthritis    CKD (chronic kidney disease), stage II    previously seen by nephrologist--- dr Florene Glen,  pt released per lov in epic 09-24-2017   Diabetes mellitus, type II ([place])    Diverticulosis of colon    Essential hypertension, benign    followed by pcp   (pt had nuclear stress test (in epic under media) dated 05-25-2008 showed normal  perfusion w/ no evidence ischemia, ef 83%   Heart disease    History of bradycardia    previously seen by cardiologist, dr branch, pt released per lov 08-11-2018 in epic, bradycardia resolved   History of sepsis    04-03-2019 hospital admission w/ urosepsis due to obstructive uropathy due to right ureter stone   Hypokalemia    Lipoma of colon    ascending   Lumbar spondylosis    Mixed hyperlipidemia    Nephrolithiasis    per CT 04-03-2019 nonobstructive bilateral stones   Ocular herpes    Osteoarthritis    followed by dr Estanislado Pandy--- hands, shoulders, feet   Osteoporosis    Rheumatoid arthritis(714.0)    rheumotologist-- dr s. Estanislado Pandy---  multiple sites, treated with methotrexate/ prednisone   Right nephrolithiasis-1.4cm Moderate right-sided urinary tract obstruction/hydronephrosis 04/03/2019   Right ureteral calculus    Seasonal allergies    Sepsis ([place]) 04/03/2019   Sepsis secondary to UTI /Nephrolithiasis with obstructive uropathy 04/03/2019   SUI (stress urinary incontinence, female)    Uterine fibroid     PAST SURGICAL HISTORY: Past Surgical History:  Procedure Laterality Date   BUNIONECTOMY Right 2012   CATARACT EXTRACTION W/ INTRAOCULAR LENS  IMPLANT, BILATERAL  2009   CHOLECYSTECTOMY  12/17/2010   Procedure: LAPAROSCOPIC CHOLECYSTECTOMY;  Surgeon: Donato Heinz;  Location: [facility];  Service: General;  Laterality: N/A;   COLONOSCOPY  05/22/2011   Procedure: COLONOSCOPY;  Surgeon: Rogene Houston, MD;  Location: [facility];  Service: Endoscopy;  Laterality: N/A;  [place], URETEROSCOPY AND STENT PLACEMENT Right 05/09/2019   Procedure: CYSTOSCOPY WITH RETROGRADE PYELOGRAM, URETEROSCOPY AND STENT PLACEMENT;  Surgeon: Robley Fries, MD;  Location: [facility];  Service: Urology;  Laterality: Right;  1 HR   CYSTOSCOPY WITH STENT PLACEMENT Right 04/03/2019   Procedure: CYSTOSCOPY, RIGHT RETROGRADE PYELOGRAM  WITH STENT  PLACEMENT;  Surgeon: Robley Fries, MD;  Location: [facility];  Service: Urology;  Laterality: Right;   EXCISION BONE CYST Right 2010   RIGHT SHOULDER   GANGLION CYST EXCISION Left 06/02/2012   Procedure: Excision ganglion left dorsal wrist;  Surgeon: Sanjuana Kava, MD;  Location: [facility];  Service: Orthopedics;  Laterality: Left;   HOLMIUM LASER APPLICATION Right [id number]   Procedure: HOLMIUM LASER APPLICATION;  Surgeon: Robley Fries, MD;  Location: [facility];  Service: Urology;  Laterality: Right;   kidney stones     LUMBAR MICRODISCECTOMY  07-16-2000   @[facility]    L 2  -3   POSTERIOR LAMINECTOMY / DECOMPRESSION LUMBAR SPINE  06-26-2009   @[facility]    w/ LATERAL MASS FUSION , L3 -- 5   SHOULDER ARTHROSCOPY WITH DISTAL CLAVICLE RESECTION Right 01-10-2009   @[facility]    w/ DEBRIDEMENT AND EXCISION RECURRENT GANGLION CYST    FAMILY HISTORY:  Family History  Problem Relation Age of Onset   Stroke Mother    Cancer Father    Diabetes Sister    Diabetes Sister    Kidney failure Brother    Kidney failure Brother    Pancreatic cancer Sister    Kidney Stones Sister        Only one Kidney   Arthritis Sister    Heart disease Daughter     SOCIAL HISTORY: Social History   Socioeconomic History   Marital status: Widowed    Spouse name: Not on file   Number of children: 1   Years of education: Not on file   Highest education level: Not on file  Occupational History   Occupation: Retired    Comment: [employer]  Tobacco Use   Smoking status: Never    Passive exposure: Past   Smokeless tobacco: Never  Vaping Use   Vaping Use: Never used  Substance and Sexual Activity   Alcohol use: No   Drug use: Never   Sexual activity: Not Currently    Birth control/protection: Post-menopausal  Other Topics Concern   Not on file  Social History Narrative   Husband passed away 2016-05-06. It has been financially harder to pay the bills since his passing, but she is making it. Has  grandchildren around which makes it easier.    Social Determinants of Health   Financial Resource Strain: Low Risk  (07/18/2021)   Overall Financial Resource Strain (CARDIA)    Difficulty of Paying Living Expenses: Not hard at all  Food Insecurity: No Food Insecurity (07/18/2021)   Hunger Vital Sign    Worried About Running Out of Food in the Last Year: Never true    Ran Out of Food in the Last Year: Never true  Transportation Needs: No Transportation Needs (07/18/2021)   PRAPARE - Hydrologist (Medical): No    Lack of Transportation (Non-Medical): No  Physical Activity: Insufficiently Active (07/18/2021)   Exercise Vital Sign    Days of Exercise per Week: 3 days    Minutes of Exercise per Session: 10 min  Stress: No Stress Concern Present (07/17/2020)   [place]    Feeling of Stress : Not at all  Social Connections: Moderately Isolated (07/17/2020)   Social Connection and Isolation Panel [NHANES]    Frequency of Communication with Friends and Family: More than three times a week    Frequency of Social Gatherings with Friends and Family: More than three times a week    Attends Religious Services: More than 4 times per year    Active Member of [employer] or Organizations: No    Attends Archivist Meetings: Never    Marital Status: Widowed  Intimate Partner Violence: Not At Risk (07/17/2020)   Humiliation, Afraid, Rape, and Kick questionnaire    Fear of Current or Ex-Partner: No    Emotionally Abused: No    Physically Abused: No    Sexually Abused: No    PHYSICAL EXAM  GENERAL EXAM/CONSTITUTIONAL: Vitals:  Vitals:   03/30/22 1518  BP: (!) 153/73  Pulse: (!) 56  Weight: 131 lb (59.4 kg)  Height: 4' 11.5" (1.511 m)   Body mass index is 26.02 kg/m. Wt Readings from Last 3 Encounters:  03/30/22 131 lb (59.4 kg)  03/09/22 130 lb 12.8 oz (59.3 kg)  01/22/22 132 lb (59.9 kg)    Patient is in no distress; well developed, nourished  and groomed; neck is supple  EYES: Visual fields full to confrontation, Extraocular movements intacts,   MUSCULOSKELETAL: Gait, strength, tone, movements noted in Neurologic exam below  NEUROLOGIC: MENTAL STATUS:     07/17/2020    2:08 PM 05/01/2020   10:44 AM  MMSE - Mini Mental State Exam  Not completed: Unable to complete   Orientation to time  5  Orientation to Place  5  Registration  3  Attention/ Calculation  5  Recall  2  Language- name 2 objects  2  Language- repeat  1  Language- follow 3 step command  3  Language- read & follow direction  1  Write a sentence  1  Copy design  1  Total score  29   awake, alert, oriented to person, place and time recent and remote memory intact normal attention and concentration language fluent, comprehension intact, naming intact fund of knowledge appropriate  CRANIAL NERVE:  2nd, 3rd, 4th, 6th - Visual fields full to confrontation, extraocular muscles intact, no nystagmus 5th - facial sensation symmetric 7th - facial strength symmetric 8th - hearing intact 9th - palate elevates symmetrically, uvula midline 11th - shoulder shrug symmetric 12th - tongue protrusion midline  MOTOR:  normal bulk and tone, full strength in the BUE, BLE  SENSORY:  normal and symmetric to light touch  COORDINATION:  finger-nose-finger, fine finger movements normal  REFLEXES:  deep tendon reflexes present and symmetric  GAIT/STATION:  Ambulates with a cane      DIAGNOSTIC DATA (LABS, IMAGING, TESTING) - I reviewed patient records, labs, notes, testing and imaging myself where available.  Lab Results  Component Value Date   WBC 7.1 11/06/2021   HGB 13.3 11/06/2021   HCT 38.5 11/06/2021   MCV 92.1 11/06/2021   PLT 270 11/06/2021      Component Value Date/Time   NA 141 03/06/2022 1007   K 3.5 03/06/2022 1007   CL 100 03/06/2022 1007   CO2 22 03/06/2022 1007   GLUCOSE 180  (H) 03/06/2022 1007   GLUCOSE 130 (H) 11/06/2021 1339   BUN 20 03/06/2022 1007   CREATININE 0.86 03/06/2022 1007   CREATININE 0.96 (H) 11/06/2021 1339   CALCIUM 10.4 (H) 03/06/2022 1007   PROT 6.8 03/06/2022 1007   ALBUMIN 4.4 03/06/2022 1007   AST 16 03/06/2022 1007   ALT 7 03/06/2022 1007   ALKPHOS 66 03/06/2022 1007   BILITOT 0.4 03/06/2022 1007   GFRNONAA 60 03/12/2020 1102   GFRAA 70 03/12/2020 1102   Lab Results  Component Value Date   CHOL 236 (H) 01/22/2022   HDL 61 01/22/2022   LDLCALC 154 (H) 01/22/2022   TRIG 117 01/22/2022   CHOLHDL 3.9 01/22/2022   Lab Results  Component Value Date   HGBA1C 6.2 (H) 01/22/2022   Lab Results  Component Value Date   VITAMINB12 318 12/15/2010   Lab Results  Component Value Date   TSH 1.390 03/13/2021    MRI Brain 02/18/2022 1.  No evidence of an acute intracranial abnormality. 2. Chronic cortical/subcortical infarct within the mid-to-anterior left frontal lobe, new from the prior brain MRI of 05/18/2012. 3. Background mild-to-moderate chronic small vessel ischemic changes within the cerebral white matter, mildly progressed from the prior MRI. 4. Multiple small chronic infarcts within the bilateral cerebellar hemispheres, increased in number from the prior MRI.    ASSESSMENT AND PLAN  89 y.o. year old female with multiple medical conditions including hypertension, hyperlipidemia, heart disease and kidney disease who is  presenting for chronic headache.  Her headaches are well-controlled with Tylenol.  Since there is increase in frequency, we will try her on a low-dose gabapentin nightly and advised patient to continue Tylenol as needed for the headaches. During her workup her MRI showed multiple chronic small bilateral cerebral and cerebellar strokes, etiology likely small vessel disease.  She is already on Crestor 5 mg every other day, will increase to 10 mg nightly and also start the patient on aspirin 81 mg daily. She is  comfortable with plan.  I will see her in 6 months for follow-up or sooner if worse     1. Chronic tension-type headache, not intractable   2. Hyperlipidemia LDL goal <100   3. Cerebrovascular accident (CVA) due to occlusion of small artery ([place])      Patient Instructions  Start gabapentin 100 mg nightly Continue with Tylenol as needed since it is helpful to control your pain Start aspirin enteric-coated 81 mg daily Increase Crestor to 10 mg nightly Continue your other medications Follow-up in 6 months or sooner if worse    No orders of the defined types were placed in this encounter.   Meds ordered this encounter  Medications   gabapentin (NEURONTIN) 100 MG capsule    Sig: Take 1 capsule (100 mg total) by mouth at bedtime.    Dispense:  30 capsule    Refill:  3   rosuvastatin (CRESTOR) 10 MG tablet    Sig: Take 1 tablet (10 mg total) by mouth at bedtime.    Dispense:  90 tablet    Refill:  3    Return in about 6 months (around 09/30/2022).    Alric Ran, MD 03/30/2022, 4:53 PM  [facility] [address], [place], [place] [address] [phone]

## 2022-04-08 ENCOUNTER — Ambulatory Visit: Payer: Medicare PPO | Attending: Physician Assistant | Admitting: Physician Assistant

## 2022-04-08 ENCOUNTER — Encounter: Payer: Self-pay | Admitting: Physician Assistant

## 2022-04-08 VITALS — BP 119/68 | HR 98 | Resp 17 | Ht 59.0 in | Wt 133.0 lb

## 2022-04-08 DIAGNOSIS — M170 Bilateral primary osteoarthritis of knee: Secondary | ICD-10-CM | POA: Diagnosis not present

## 2022-04-08 DIAGNOSIS — Z79899 Other long term (current) drug therapy: Secondary | ICD-10-CM | POA: Diagnosis not present

## 2022-04-08 DIAGNOSIS — R011 Cardiac murmur, unspecified: Secondary | ICD-10-CM

## 2022-04-08 DIAGNOSIS — M47816 Spondylosis without myelopathy or radiculopathy, lumbar region: Secondary | ICD-10-CM

## 2022-04-08 DIAGNOSIS — M19071 Primary osteoarthritis, right ankle and foot: Secondary | ICD-10-CM | POA: Diagnosis not present

## 2022-04-08 DIAGNOSIS — M19041 Primary osteoarthritis, right hand: Secondary | ICD-10-CM | POA: Diagnosis not present

## 2022-04-08 DIAGNOSIS — I10 Essential (primary) hypertension: Secondary | ICD-10-CM

## 2022-04-08 DIAGNOSIS — M0579 Rheumatoid arthritis with rheumatoid factor of multiple sites without organ or systems involvement: Secondary | ICD-10-CM

## 2022-04-08 DIAGNOSIS — M810 Age-related osteoporosis without current pathological fracture: Secondary | ICD-10-CM | POA: Diagnosis not present

## 2022-04-08 DIAGNOSIS — M19011 Primary osteoarthritis, right shoulder: Secondary | ICD-10-CM | POA: Diagnosis not present

## 2022-04-08 DIAGNOSIS — M19012 Primary osteoarthritis, left shoulder: Secondary | ICD-10-CM

## 2022-04-08 DIAGNOSIS — E119 Type 2 diabetes mellitus without complications: Secondary | ICD-10-CM | POA: Diagnosis not present

## 2022-04-08 DIAGNOSIS — M19042 Primary osteoarthritis, left hand: Secondary | ICD-10-CM

## 2022-04-08 DIAGNOSIS — M19072 Primary osteoarthritis, left ankle and foot: Secondary | ICD-10-CM

## 2022-04-08 LAB — CBC WITH DIFFERENTIAL/PLATELET
Absolute Monocytes: 511 cells/uL (ref 200–950)
Basophils Absolute: 63 cells/uL (ref 0–200)
Basophils Relative: 0.9 %
Eosinophils Absolute: 147 cells/uL (ref 15–500)
Eosinophils Relative: 2.1 %
HCT: 36.5 % (ref 35.0–45.0)
Hemoglobin: 12.2 g/dL (ref 11.7–15.5)
Lymphs Abs: 1974 cells/uL (ref 850–3900)
MCH: 31.1 pg (ref 27.0–33.0)
MCHC: 33.4 g/dL (ref 32.0–36.0)
MCV: 93.1 fL (ref 80.0–100.0)
MPV: 9.2 fL (ref 7.5–12.5)
Monocytes Relative: 7.3 %
Neutro Abs: 4305 cells/uL (ref 1500–7800)
Neutrophils Relative %: 61.5 %
Platelets: 258 10*3/uL (ref 140–400)
RBC: 3.92 10*6/uL (ref 3.80–5.10)
RDW: 13.1 % (ref 11.0–15.0)
Total Lymphocyte: 28.2 %
WBC: 7 10*3/uL (ref 3.8–10.8)

## 2022-04-08 MED ORDER — PREDNISONE 1 MG PO TABS: ORAL_TABLET | ORAL | 0 refills | Status: DC

## 2022-04-08 MED ORDER — METHOTREXATE SODIUM 2.5 MG PO TABS: ORAL_TABLET | ORAL | 0 refills | Status: DC

## 2022-04-08 NOTE — Patient Instructions (Signed)
Standing Labs We placed an order today for your standing lab work.   Please have your standing labs drawn in June and every 3 months   Please have your labs drawn 2 weeks prior to your appointment so that the provider can discuss your lab results at your appointment, if possible.  Please note that you may see your imaging and lab results in MyChart before we have reviewed them. We will contact you once all results are reviewed. Please allow our office up to 72 hours to thoroughly review all of the results before contacting the office for clarification of your results.  WALK-IN LAB HOURS  Monday through Thursday from 8:00 am -12:30 pm and 1:00 pm-5:00 pm and Friday from 8:00 am-12:00 pm.  Patients with office visits requiring labs will be seen before walk-in labs.  You may encounter longer than normal wait times. Please allow additional time. Wait times may be shorter on  Monday and Thursday afternoons.  We do not book appointments for walk-in labs. We appreciate your patience and understanding with our staff.   Labs are drawn by [facility]. Please bring your co-pay at the time of your lab draw.  You may receive a bill from [facility] for your lab work.  Please note if you are on Hydroxychloroquine and and an order has been placed for a Hydroxychloroquine level,  you will need to have it drawn 4 hours or more after your last dose.  If you wish to have your labs drawn at another location, please call the office 24 hours in advance so we can fax the orders.  The office is located at [address], [address], [place], [place] [address]   If you have any questions regarding directions or hours of operation,  please call [phone].   As a reminder, please drink plenty of water prior to coming for your lab work. Thanks!  

## 2022-04-09 NOTE — Progress Notes (Signed)
CBC WNL

## 2022-04-20 ENCOUNTER — Other Ambulatory Visit: Payer: Self-pay | Admitting: Family Medicine

## 2022-04-20 DIAGNOSIS — M0579 Rheumatoid arthritis with rheumatoid factor of multiple sites without organ or systems involvement: Secondary | ICD-10-CM

## 2022-04-20 DIAGNOSIS — E1169 Type 2 diabetes mellitus with other specified complication: Secondary | ICD-10-CM

## 2022-04-20 DIAGNOSIS — I10 Essential (primary) hypertension: Secondary | ICD-10-CM

## 2022-04-20 DIAGNOSIS — E785 Hyperlipidemia, unspecified: Secondary | ICD-10-CM

## 2022-04-20 DIAGNOSIS — R7989 Other specified abnormal findings of blood chemistry: Secondary | ICD-10-CM

## 2022-04-20 DIAGNOSIS — E559 Vitamin D deficiency, unspecified: Secondary | ICD-10-CM

## 2022-04-25 ENCOUNTER — Other Ambulatory Visit: Payer: Self-pay | Admitting: Family Medicine

## 2022-04-25 DIAGNOSIS — E785 Hyperlipidemia, unspecified: Secondary | ICD-10-CM

## 2022-05-04 ENCOUNTER — Other Ambulatory Visit: Payer: Self-pay | Admitting: Family Medicine

## 2022-05-04 ENCOUNTER — Other Ambulatory Visit: Payer: Self-pay | Admitting: Physician Assistant

## 2022-05-04 DIAGNOSIS — E559 Vitamin D deficiency, unspecified: Secondary | ICD-10-CM

## 2022-05-04 DIAGNOSIS — M0579 Rheumatoid arthritis with rheumatoid factor of multiple sites without organ or systems involvement: Secondary | ICD-10-CM

## 2022-05-04 DIAGNOSIS — I10 Essential (primary) hypertension: Secondary | ICD-10-CM

## 2022-05-04 DIAGNOSIS — R7989 Other specified abnormal findings of blood chemistry: Secondary | ICD-10-CM

## 2022-05-04 DIAGNOSIS — E669 Obesity, unspecified: Secondary | ICD-10-CM

## 2022-05-04 DIAGNOSIS — E785 Hyperlipidemia, unspecified: Secondary | ICD-10-CM

## 2022-05-27 ENCOUNTER — Other Ambulatory Visit: Payer: Self-pay | Admitting: Physician Assistant

## 2022-05-27 ENCOUNTER — Encounter: Payer: Self-pay | Admitting: Internal Medicine

## 2022-05-27 ENCOUNTER — Other Ambulatory Visit: Payer: Self-pay | Admitting: Family Medicine

## 2022-05-27 ENCOUNTER — Ambulatory Visit (INDEPENDENT_AMBULATORY_CARE_PROVIDER_SITE_OTHER): Payer: Medicare PPO | Admitting: Internal Medicine

## 2022-05-27 VITALS — BP 116/55 | HR 67 | Resp 16 | Ht 59.0 in | Wt 133.0 lb

## 2022-05-27 DIAGNOSIS — M0579 Rheumatoid arthritis with rheumatoid factor of multiple sites without organ or systems involvement: Secondary | ICD-10-CM

## 2022-05-27 DIAGNOSIS — H6690 Otitis media, unspecified, unspecified ear: Secondary | ICD-10-CM | POA: Diagnosis not present

## 2022-05-27 DIAGNOSIS — J301 Allergic rhinitis due to pollen: Secondary | ICD-10-CM | POA: Diagnosis not present

## 2022-05-27 MED ORDER — CETIRIZINE HCL 10 MG PO TABS: 10.0000 mg | ORAL_TABLET | Freq: Every day | ORAL | 1 refills | Status: DC

## 2022-05-27 MED ORDER — AMOXICILLIN-POT CLAVULANATE 875-125 MG PO TABS: 1.0000 | ORAL_TABLET | Freq: Two times a day (BID) | ORAL | 0 refills | Status: DC

## 2022-05-27 MED ORDER — FLUTICASONE PROPIONATE 50 MCG/ACT NA SUSP: 2.0000 | Freq: Every day | NASAL | 0 refills | Status: DC

## 2022-05-27 NOTE — Patient Instructions (Signed)
Thank you, Ms.Sherol Dade for allowing [place] to provide your care today.   I believe your cough is coming from post nasal drip. We will treat your allergies and congestion to improve your cough. You also have an ear infection. Follow up if not improving with treatment below.   Left ear infection  - amoxicillin-clavulanate (AUGMENTIN) 875-125 MG tablet; Take 1 tablet by mouth 2 (two) times daily.  Dispense: 20 tablet; Refill: 0  Seasonal allergic rhinitis due to pollen  - cetirizine (ZYRTEC ALLERGY) 10 MG tablet; Take 1 tablet (10 mg total) by mouth daily.  Dispense: 60 tablet; Refill: 1 - fluticasone (FLONASE) 50 MCG/ACT nasal spray; Place 2 sprays into both nostrils daily.  Dispense: 15.8 mL; Refill: 0    Follow up if symptoms worsen or fail to improve     Thurmon Fair, M.D.

## 2022-05-27 NOTE — Progress Notes (Signed)
   HPI:[name] is a 89 y.o. female who presents for evaluation of cough. The cough started one week ago. She has also lost her appetitie and felt week. She has no shortness of breath. No lung condition ,but does have a albuterol inhaler for PRN shortness of breath. She has nasal congestion. No fever or sick contacts.     Physical Exam: Vitals:   05/27/22 1421  BP: (!) 116/55  Pulse: 67  Resp: 16  SpO2: 92%  Weight: 133 lb (60.3 kg)  Height: 4\' 11"  (1.499 m)     Physical Exam Constitutional:      General: She is not in acute distress.    Appearance: She is not ill-appearing.  HENT:     Right Ear: Tympanic membrane normal.     Left Ear: A middle ear effusion is present. Tympanic membrane is erythematous and bulging.  Cardiovascular:     Rate and Rhythm: Normal rate and regular rhythm.     Heart sounds: No murmur heard. Pulmonary:     Effort: Pulmonary effort is normal.     Breath sounds: No wheezing or rales.      Assessment & Plan:   Stacy Meyers was seen today for cough.  Acute otitis media, unspecified otitis media type Assessment & Plan: Exam showed bulging fluid filled tympanic membrane. - amoxicillin-clavulanate (AUGMENTIN) 875-125 MG tablet; Take 1 tablet by mouth 2 (two) times daily.  Dispense: 20 tablet; Refill: 0   Orders: -     Amoxicillin-Pot Clavulanate; Take 1 tablet by mouth 2 (two) times daily.  Dispense: 20 tablet; Refill: 0  Seasonal allergic rhinitis due to pollen Assessment & Plan: Patient is congested from uncontrolled seasonal allergies.  Prescribed Zyrtec  Start nasal rinses Prescribed Flonase   Orders: -     Cetirizine HCl; Take 1 tablet (10 mg total) by mouth daily.  Dispense: 60 tablet; Refill: 1 -     Fluticasone Propionate; Place 2 sprays into both nostrils daily.  Dispense: 15.8 mL; Refill: 0      Milus Banister, MD

## 2022-05-28 ENCOUNTER — Other Ambulatory Visit (INDEPENDENT_AMBULATORY_CARE_PROVIDER_SITE_OTHER): Payer: Medicare PPO

## 2022-05-28 ENCOUNTER — Telehealth: Payer: Self-pay | Admitting: Family Medicine

## 2022-05-28 DIAGNOSIS — R300 Dysuria: Secondary | ICD-10-CM | POA: Diagnosis not present

## 2022-05-28 LAB — POCT URINALYSIS DIP (CLINITEK)
Bilirubin, UA: NEGATIVE
Glucose, UA: NEGATIVE mg/dL
Ketones, POC UA: NEGATIVE mg/dL
Nitrite, UA: NEGATIVE
POC PROTEIN,UA: 30 — AB
Spec Grav, UA: 1.01 (ref 1.010–1.025)
Urobilinogen, UA: 0.2 E.U./dL
pH, UA: 6 (ref 5.0–8.0)

## 2022-05-28 NOTE — Assessment & Plan Note (Signed)
Patient is congested from uncontrolled seasonal allergies.  Prescribed Zyrtec  Start nasal rinses Prescribed Flonase

## 2022-05-28 NOTE — Telephone Encounter (Signed)
Patient granddaughter is aware

## 2022-05-28 NOTE — Telephone Encounter (Signed)
Aware of labs results

## 2022-05-28 NOTE — Assessment & Plan Note (Addendum)
Exam showed bulging fluid filled tympanic membrane. - amoxicillin-clavulanate (AUGMENTIN) 875-125 MG tablet; Take 1 tablet by mouth 2 (two) times daily.  Dispense: 20 tablet; Refill: 0

## 2022-05-28 NOTE — Telephone Encounter (Signed)
Pts granddaughter called in regards to urine lab results. She does not understand how to read my chart.

## 2022-05-28 NOTE — Telephone Encounter (Signed)
Family aware to bring sample for testing

## 2022-05-28 NOTE — Progress Notes (Signed)
Ampicillin not prescribed as started yesterday on augmentin for ear infection

## 2022-05-28 NOTE — Telephone Encounter (Signed)
Pts granddaughter called and stated pt has developed a UTI. Asked could she come in office to drop off urine sample. Please Follow up with granddaughter as soon as possible. Can leave a detailed msg if needed.

## 2022-05-30 LAB — URINE CULTURE: Organism ID, Bacteria: NO GROWTH

## 2022-06-01 ENCOUNTER — Encounter: Payer: Self-pay | Admitting: Internal Medicine

## 2022-06-02 ENCOUNTER — Telehealth: Payer: Self-pay | Admitting: Family Medicine

## 2022-06-02 NOTE — Telephone Encounter (Signed)
Patient calling test results.

## 2022-06-03 NOTE — Telephone Encounter (Signed)
Patient aware.

## 2022-06-13 ENCOUNTER — Other Ambulatory Visit: Payer: Self-pay | Admitting: Physician Assistant

## 2022-06-13 DIAGNOSIS — M0579 Rheumatoid arthritis with rheumatoid factor of multiple sites without organ or systems involvement: Secondary | ICD-10-CM

## 2022-06-18 ENCOUNTER — Other Ambulatory Visit: Payer: Self-pay

## 2022-06-18 ENCOUNTER — Ambulatory Visit (INDEPENDENT_AMBULATORY_CARE_PROVIDER_SITE_OTHER): Payer: Medicare PPO | Admitting: Family Medicine

## 2022-06-18 ENCOUNTER — Encounter: Payer: Self-pay | Admitting: Family Medicine

## 2022-06-18 VITALS — BP 137/54 | HR 69 | Ht 59.0 in | Wt 134.0 lb

## 2022-06-18 DIAGNOSIS — E663 Overweight: Secondary | ICD-10-CM | POA: Diagnosis not present

## 2022-06-18 DIAGNOSIS — R7303 Prediabetes: Secondary | ICD-10-CM

## 2022-06-18 DIAGNOSIS — I10 Essential (primary) hypertension: Secondary | ICD-10-CM

## 2022-06-18 DIAGNOSIS — E785 Hyperlipidemia, unspecified: Secondary | ICD-10-CM

## 2022-06-18 DIAGNOSIS — G441 Vascular headache, not elsewhere classified: Secondary | ICD-10-CM | POA: Diagnosis not present

## 2022-06-18 DIAGNOSIS — Z1231 Encounter for screening mammogram for malignant neoplasm of breast: Secondary | ICD-10-CM

## 2022-06-18 DIAGNOSIS — M0579 Rheumatoid arthritis with rheumatoid factor of multiple sites without organ or systems involvement: Secondary | ICD-10-CM | POA: Diagnosis not present

## 2022-06-18 DIAGNOSIS — J301 Allergic rhinitis due to pollen: Secondary | ICD-10-CM

## 2022-06-18 DIAGNOSIS — E782 Mixed hyperlipidemia: Secondary | ICD-10-CM

## 2022-06-18 MED ORDER — FLUTICASONE PROPIONATE 50 MCG/ACT NA SUSP: 2.0000 | Freq: Every day | NASAL | 0 refills | Status: DC

## 2022-06-18 MED ORDER — CETIRIZINE HCL 10 MG PO TABS: 10.0000 mg | ORAL_TABLET | Freq: Every day | ORAL | 1 refills | Status: DC

## 2022-06-18 NOTE — Patient Instructions (Addendum)
F/U in 5 months, call if you need me before  Fasting lipid, cmp and eGFr and HBa1c in next 1 week  Gabapentin for headaches is prescribed  by Neurologist is good up uuntil July, so you need to contact the Doctor's office  for refills to last till her September appointment. Calling the pharmacist and asking that they contact the Dr for refills is also very appropriate   Need TdaP  Please schedule Mammogram at checkouit  Thanks for choosing [facility], we consider it a privelige to serve you.' Please get rid of rugs, and wear closed shoes to reduce falls

## 2022-06-22 ENCOUNTER — Other Ambulatory Visit: Payer: Self-pay | Admitting: Internal Medicine

## 2022-06-22 DIAGNOSIS — I10 Essential (primary) hypertension: Secondary | ICD-10-CM | POA: Diagnosis not present

## 2022-06-22 DIAGNOSIS — E785 Hyperlipidemia, unspecified: Secondary | ICD-10-CM | POA: Diagnosis not present

## 2022-06-22 DIAGNOSIS — R7303 Prediabetes: Secondary | ICD-10-CM | POA: Diagnosis not present

## 2022-06-23 LAB — CMP14+EGFR
ALT: 8 IU/L (ref 0–32)
AST: 12 IU/L (ref 0–40)
Albumin: 3.9 g/dL (ref 3.7–4.7)
Alkaline Phosphatase: 66 IU/L (ref 44–121)
BUN/Creatinine Ratio: 23 (ref 12–28)
BUN: 20 mg/dL (ref 8–27)
Bilirubin Total: 0.4 mg/dL (ref 0.0–1.2)
CO2: 25 mmol/L (ref 20–29)
Calcium: 9.9 mg/dL (ref 8.7–10.3)
Chloride: 103 mmol/L (ref 96–106)
Creatinine, Ser: 0.87 mg/dL (ref 0.57–1.00)
Globulin, Total: 2.5 g/dL (ref 1.5–4.5)
Glucose: 141 mg/dL — ABNORMAL HIGH (ref 70–99)
Potassium: 3.5 mmol/L (ref 3.5–5.2)
Sodium: 144 mmol/L (ref 134–144)
Total Protein: 6.4 g/dL (ref 6.0–8.5)
eGFR: 64 mL/min/{1.73_m2} (ref >59)

## 2022-06-23 LAB — LIPID PANEL
Chol/HDL Ratio: 2.1 ratio (ref 0.0–4.4)
Cholesterol, Total: 129 mg/dL (ref 100–199)
HDL: 62 mg/dL (ref >39)
LDL Chol Calc (NIH): 49 mg/dL (ref 0–99)
Triglycerides: 96 mg/dL (ref 0–149)
VLDL Cholesterol Cal: 18 mg/dL (ref 5–40)

## 2022-06-23 LAB — HEMOGLOBIN A1C
Est. average glucose Bld gHb Est-mCnc: 146 mg/dL
Hgb A1c MFr Bld: 6.7 % — ABNORMAL HIGH (ref 4.8–5.6)

## 2022-06-24 NOTE — Assessment & Plan Note (Signed)
Has had neurology eval, maintained on gabapentin which has been excellent in reducing frequency and severity of headache, continue same and keep f/u

## 2022-06-24 NOTE — Assessment & Plan Note (Signed)
Hyperlipidemia:Low fat diet discussed and encouraged.   Lipid Panel  Lab Results  Component Value Date   CHOL 129 06/22/2022   HDL 62 06/22/2022   LDLCALC 49 06/22/2022   TRIG 96 06/22/2022   CHOLHDL 2.1 06/22/2022     Controlled, no change in medication

## 2022-06-24 NOTE — Assessment & Plan Note (Signed)
Cotrolled and managed by Rheumatology

## 2022-06-24 NOTE — Assessment & Plan Note (Signed)
Patient educated about the importance of limiting  Carbohydrate intake , the need to commit to daily physical activity for a minimum of 30 minutes , and to commit weight loss. The fact that changes in all these areas will reduce or eliminate all together the development of diabetes is stressed.  Deteriorated , needs to reduce  sweets and carbs, rept in 5 months     Latest Ref Rng & Units 06/22/2022   12:58 PM 03/06/2022   10:07 AM 01/22/2022    4:32 PM 11/06/2021    1:39 PM 06/19/2021   11:49 AM  Diabetic Labs  HbA1c 4.8 - 5.6 % 6.7   6.2     Chol 100 - 199 mg/dL 161   096     HDL >04 mg/dL 62   61     Calc LDL 0 - 99 mg/dL 49   540     Triglycerides 0 - 149 mg/dL 96   981     Creatinine 0.57 - 1.00 mg/dL 1.91  4.78   2.95  6.21       06/18/2022    3:51 PM 05/27/2022    2:21 PM 04/08/2022   10:56 AM 03/30/2022    3:18 PM 03/09/2022    1:53 PM 02/05/2022   11:08 AM 01/22/2022    4:25 PM  BP/Weight  Systolic BP 137 116 119 153 116 141 130  Diastolic BP 54 55 68 73 54 76 70  Wt. (Lbs) 134 133 133 131 130.8    BMI 27.06 kg/m2 26.86 kg/m2 26.86 kg/m2 26.02 kg/m2 25.98 kg/m2        Latest Ref Rng & Units 11/22/2019   12:00 AM 05/08/2019    8:00 AM  Foot/eye exam completion dates  Eye Exam No Retinopathy No Retinopathy       Foot Form Completion   Done     This result is from an external source.

## 2022-06-24 NOTE — Assessment & Plan Note (Signed)
  Patient re-educated about  the importance of commitment to a  minimum of 150 minutes of exercise per week as able.  The importance of healthy food choices with portion control discussed, as well as eating regularly and within a 12 hour window most days. The need to choose "clean , green" food 50 to 75% of the time is discussed, as well as to make water the primary drink and set a goal of 64 ounces water daily.       06/18/2022    3:51 PM 05/27/2022    2:21 PM 04/08/2022   10:56 AM  Weight /BMI  Weight 134 lb 133 lb 133 lb  Height 4\' 11"  (1.499 m) 4\' 11"  (1.499 m) 4\' 11"  (1.499 m)  BMI 27.06 kg/m2 26.86 kg/m2 26.86 kg/m2

## 2022-06-24 NOTE — Progress Notes (Signed)
Stacy Meyers     MRN: [id number]      DOB: 1932-08-17  Chief Complaint  Patient presents with   Follow-up    Follow up    HPI Stacy Meyers is here for follow up and re-evaluation of chronic medical conditions, medication management and review of any available recent lab and radiology data.  Preventive health is updated, specifically  Cancer screening and Immunization.   Questions or concerns regarding consultations or procedures which the PT has had in the interim are  addressed. The PT denies any adverse reactions to current medications since the last visit.  There are no new concerns.  There are no specific complaints   ROS Denies recent fever or chills. Denies sinus pressure, nasal congestion, ear pain or sore throat. Denies chest congestion, productive cough or wheezing. Denies chest pains, palpitations and leg swelling Denies abdominal pain, nausea, vomiting,diarrhea or constipation.   Denies dysuria, frequency, hesitancy or incontinence. Chronic  joint pain, swelling and limitation in mobility. Denies headaches, seizures, numbness, or tingling. Denies depression, anxiety or insomnia. Denies skin break down or rash.   PE  BP (!) 137/54 (BP Location: Right Arm, Patient Position: Sitting, Cuff Size: Normal)   Pulse 69   Ht 4\' 11"  (1.499 m)   Wt 134 lb (60.8 kg)   SpO2 94%   BMI 27.06 kg/m   Patient alert and oriented and in no cardiopulmonary distress.  HEENT: No facial asymmetry, EOMI,     Neck decreased  ROM.  Chest: Clear to auscultation bilaterally.  CVS: S1, S2 no murmurs, no S3.Regular rate.  ABD: Soft non tender.   Ext: No edema  MS: decreased  ROM spine, shoulders, hips and knees.Severe deformity of fingers  Skin: Intact, no ulcerations or rash noted.  Psych: Good eye contact, normal affect. Memory intact not anxious or depressed appearing.  CNS: CN 2-12 intact, power,  normal throughout.no focal deficits noted.   Assessment &  Plan  Prediabetes Patient educated about the importance of limiting  Carbohydrate intake , the need to commit to daily physical activity for a minimum of 30 minutes , and to commit weight loss. The fact that changes in all these areas will reduce or eliminate all together the development of diabetes is stressed.  Deteriorated , needs to reduce  sweets and carbs, rept in 5 months     Latest Ref Rng & Units 06/22/2022   12:58 PM 03/06/2022   10:07 AM 01/22/2022    4:32 PM 11/06/2021    1:39 PM 06/19/2021   11:49 AM  Diabetic Labs  HbA1c 4.8 - 5.6 % 6.7   6.2     Chol 100 - 199 mg/dL 409   811     HDL >91 mg/dL 62   61     Calc LDL 0 - 99 mg/dL 49   478     Triglycerides 0 - 149 mg/dL 96   295     Creatinine 0.57 - 1.00 mg/dL 6.21  3.08   6.57  8.46       06/18/2022    3:51 PM 05/27/2022    2:21 PM 04/08/2022   10:56 AM 03/30/2022    3:18 PM 03/09/2022    1:53 PM 02/05/2022   11:08 AM 01/22/2022    4:25 PM  BP/Weight  Systolic BP 137 116 119 153 116 141 130  Diastolic BP 54 55 68 73 54 76 70  Wt. (Lbs) 134 133 133 131 130.8  BMI 27.06 kg/m2 26.86 kg/m2 26.86 kg/m2 26.02 kg/m2 25.98 kg/m2        Latest Ref Rng & Units 11/22/2019   12:00 AM 05/08/2019    8:00 AM  Foot/eye exam completion dates  Eye Exam No Retinopathy No Retinopathy       Foot Form Completion   Done     This result is from an external source.      Headache Has had neurology eval, maintained on gabapentin which has been excellent in reducing frequency and severity of headache, continue same and keep f/u  Mixed hyperlipidemia Hyperlipidemia:Low fat diet discussed and encouraged.   Lipid Panel  Lab Results  Component Value Date   CHOL 129 06/22/2022   HDL 62 06/22/2022   LDLCALC 49 06/22/2022   TRIG 96 06/22/2022   CHOLHDL 2.1 06/22/2022     Controlled, no change in medication   Rheumatoid arthritis with rheumatoid factor of multiple sites without organ or systems involvement (HCC) Cotrolled and  managed by Rheumatology  Overweight (BMI 25.0-29.9)  Patient re-educated about  the importance of commitment to a  minimum of 150 minutes of exercise per week as able.  The importance of healthy food choices with portion control discussed, as well as eating regularly and within a 12 hour window most days. The need to choose "clean , green" food 50 to 75% of the time is discussed, as well as to make water the primary drink and set a goal of 64 ounces water daily.       06/18/2022    3:51 PM 05/27/2022    2:21 PM 04/08/2022   10:56 AM  Weight /BMI  Weight 134 lb 133 lb 133 lb  Height 4\' 11"  (1.499 m) 4\' 11"  (1.499 m) 4\' 11"  (1.499 m)  BMI 27.06 kg/m2 26.86 kg/m2 26.86 kg/m2

## 2022-06-27 ENCOUNTER — Other Ambulatory Visit: Payer: Self-pay | Admitting: Family Medicine

## 2022-06-27 ENCOUNTER — Other Ambulatory Visit: Payer: Self-pay | Admitting: Physician Assistant

## 2022-06-27 DIAGNOSIS — J301 Allergic rhinitis due to pollen: Secondary | ICD-10-CM

## 2022-06-27 DIAGNOSIS — M0579 Rheumatoid arthritis with rheumatoid factor of multiple sites without organ or systems involvement: Secondary | ICD-10-CM

## 2022-06-29 NOTE — Telephone Encounter (Signed)
Last Fill: 04/08/2022  Labs: 04/08/2022 CBC WNL, 06/22/2022 CMP- Glucose 141  Next Visit: 09/09/2022  Last Visit: 05/04/2022  DX:  Rheumatoid arthritis with rheumatoid factor of multiple sites without organ or systems involvement   Current Dose per office note 04/08/2022: Methotrexate 3 tablets by mouth every 7 days prednisone 1 mg 4 tablets daily.   Okay to refill Methotrexate and Prednisone?

## 2022-07-20 ENCOUNTER — Other Ambulatory Visit: Payer: Self-pay | Admitting: Family Medicine

## 2022-07-31 ENCOUNTER — Other Ambulatory Visit: Payer: Self-pay | Admitting: Neurology

## 2022-07-31 ENCOUNTER — Other Ambulatory Visit: Payer: Self-pay | Admitting: Family Medicine

## 2022-07-31 DIAGNOSIS — E559 Vitamin D deficiency, unspecified: Secondary | ICD-10-CM

## 2022-07-31 DIAGNOSIS — R7989 Other specified abnormal findings of blood chemistry: Secondary | ICD-10-CM

## 2022-07-31 DIAGNOSIS — E1169 Type 2 diabetes mellitus with other specified complication: Secondary | ICD-10-CM

## 2022-07-31 DIAGNOSIS — I10 Essential (primary) hypertension: Secondary | ICD-10-CM

## 2022-07-31 DIAGNOSIS — M0579 Rheumatoid arthritis with rheumatoid factor of multiple sites without organ or systems involvement: Secondary | ICD-10-CM

## 2022-07-31 DIAGNOSIS — E785 Hyperlipidemia, unspecified: Secondary | ICD-10-CM

## 2022-08-05 ENCOUNTER — Ambulatory Visit (INDEPENDENT_AMBULATORY_CARE_PROVIDER_SITE_OTHER): Payer: Medicare PPO

## 2022-08-05 VITALS — Ht 59.5 in | Wt 137.0 lb

## 2022-08-05 DIAGNOSIS — Z0000 Encounter for general adult medical examination without abnormal findings: Secondary | ICD-10-CM

## 2022-08-05 NOTE — Progress Notes (Signed)
Because this visit was a virtual/telehealth visit,  certain criteria was not obtained, such a blood pressure, CBG if patient is a diabetic, and timed up and go. Any medications not marked as "taking" was not mentioned during the medication reconciliation part of the visit. Any vitals not documented were not able to be obtained due to this being a telehealth visit. Vitals documented are verbally provided by the patient.   Per patient no change in vitals since last visit, unable to obtain new vitals due to telehealth visit.   Subjective:   Stacy Meyers is a 89 y.o. female who presents for Medicare Annual (Subsequent) preventive examination.  Visit Complete: Virtual  I connected with  Sherol Dade on 08/05/22 by a audio enabled telemedicine application and verified that I am speaking with the correct person using two identifiers.  Patient Location: Home  Provider Location: Home Office  I discussed the limitations of evaluation and management by telemedicine. The patient expressed understanding and agreed to proceed.  Patient Medicare AWV questionnaire was completed by the patient on n/a; I have confirmed that all information answered by patient is correct and no changes since this date.  Review of Systems     Cardiac Risk Factors include: advanced age (>77men, >28 women);dyslipidemia;hypertension;sedentary lifestyle     Objective:    Today's Vitals   08/05/22 1300 08/05/22 1302  Weight: 137 lb (62.1 kg)   Height: 4' 11.5" (1.511 m)   PainSc:  0-No pain   Body mass index is 27.21 kg/m.     08/05/2022    1:00 PM 07/18/2021    2:46 PM 07/17/2020    2:06 PM 07/17/2019    9:53 AM 05/09/2019    9:31 AM 04/03/2019   12:17 PM 04/03/2019    5:16 AM  Advanced Directives  Does Patient Have a Medical Advance Directive? No No Yes Yes No Yes Yes  Type of Advance Directive   Living will Healthcare Power of [employer] of [place];Living will Healthcare Power of [place];Living  will  Does patient want to make changes to medical advance directive?   No - Patient declined   No - Patient declined   Copy of Healthcare Power of Attorney in Chart?    No - copy requested  No - copy requested   Would patient like information on creating a medical advance directive? No - Patient declined Yes (ED - Information included in AVS)   No - Patient declined      Current Medications (verified) Outpatient Encounter Medications as of 08/05/2022  Medication Sig   acetaminophen (TYLENOL) 500 MG tablet Take 1,000 mg by mouth every 6 (six) hours as needed (for pain).    acyclovir (ZOVIRAX) 400 MG tablet TAKE 1 TABLET BY MOUTH TWICE A DAY   albuterol (VENTOLIN HFA) 108 (90 Base) MCG/ACT inhaler TAKE 2 PUFFS BY MOUTH EVERY 6 HOURS AS NEEDED FOR WHEEZE OR SHORTNESS OF BREATH   alendronate (FOSAMAX) 70 MG tablet TAKE 1 TABLET BY MOUTH EVERY 7 DAYS. TAKE WITH A FULL GLASS OF WATER ON AN EMPTY STOMACH.   amLODipine (NORVASC) 5 MG tablet TAKE 1 TABLET BY MOUTH EVERY DAY   butalbital-acetaminophen-caffeine (FIORICET) 50-325-40 MG tablet Take one tablet by mouth once daily, as needed, for severe headache   cetirizine (ZYRTEC) 10 MG tablet TAKE 1 TABLET BY MOUTH EVERY DAY   fluticasone (FLONASE) 50 MCG/ACT nasal spray SPRAY 2 SPRAYS INTO EACH NOSTRIL EVERY DAY   folic acid (FOLVITE) 1 MG  tablet TAKE 1 TABLET BY MOUTH EVERY DAY   gabapentin (NEURONTIN) 100 MG capsule TAKE 1 CAPSULE BY MOUTH AT BEDTIME.   hydrochlorothiazide (MICROZIDE) 12.5 MG capsule TAKE 1 CAPSULE BY MOUTH EVERY DAY   KLOR-CON M20 20 MEQ tablet TAKE 1 TABLET BY MOUTH EVERY DAY   methotrexate (RHEUMATREX) 2.5 MG tablet TAKE 3 TABLETS BY MOUTH EACH WEEK ON THE SAME DAY.   montelukast (SINGULAIR) 10 MG tablet TAKE 1 TABLET BY MOUTH EVERYDAY AT BEDTIME   predniSONE (DELTASONE) 1 MG tablet TAKE 4 TABLETS (4 MG TOTAL) BY MOUTH DAILY WITH BREAKFAST   promethazine-dextromethorphan (PROMETHAZINE-DM) 6.25-15 MG/5ML syrup Take 5 mLs by mouth 4  (four) times daily as needed for cough.   Propylene Glycol (SYSTANE BALANCE OP) Place 1 drop into both eyes daily as needed (for dry eyes).    rosuvastatin (CRESTOR) 10 MG tablet Take 1 tablet (10 mg total) by mouth at bedtime.   rosuvastatin (CRESTOR) 5 MG tablet TAKE 1 TABLET BY MOUTH EVERY OTHER DAY   No facility-administered encounter medications on file as of 08/05/2022.    Allergies (verified) Aspirin and Sudafed [pseudoephedrine hcl]   History: Past Medical History:  Diagnosis Date   Acid reflux    Acute cystitis with hematuria 04/11/2011   Anemia    ARF (acute renal failure) (HCC) 12/14/2010   Arthritis    CKD (chronic kidney disease), stage II    previously seen by nephrologist--- dr Lowell Guitar,  pt released per lov in epic 09-24-2017   Diabetes mellitus, type II (HCC)    Diverticulosis of colon    Essential hypertension, benign    followed by pcp   (pt had nuclear stress test (in epic under media) dated 05-25-2008 showed normal perfusion w/ no evidence ischemia, ef 83%   Heart disease    History of bradycardia    previously seen by cardiologist, dr branch, pt released per lov 08-11-2018 in epic, bradycardia resolved   History of sepsis    04-03-2019 hospital admission w/ urosepsis due to obstructive uropathy due to right ureter stone   Hypokalemia    Lipoma of colon    ascending   Lumbar spondylosis    Mixed hyperlipidemia    Nephrolithiasis    per CT 04-03-2019 nonobstructive bilateral stones   Ocular herpes    Osteoarthritis    followed by dr Corliss Skains--- hands, shoulders, feet   Osteoporosis    Rheumatoid arthritis(714.0)    rheumotologist-- dr s. Corliss Skains---  multiple sites, treated with methotrexate/ prednisone   Right nephrolithiasis-1.4cm Moderate right-sided urinary tract obstruction/hydronephrosis 04/03/2019   Right ureteral calculus    Seasonal allergies    Sepsis (HCC) 04/03/2019   Sepsis secondary to UTI /Nephrolithiasis with obstructive uropathy 04/03/2019    SUI (stress urinary incontinence, female)    Uterine fibroid    Past Surgical History:  Procedure Laterality Date   BUNIONECTOMY Right 2012   CATARACT EXTRACTION W/ INTRAOCULAR LENS  IMPLANT, BILATERAL  2009   CHOLECYSTECTOMY  12/17/2010   Procedure: LAPAROSCOPIC CHOLECYSTECTOMY;  Surgeon: Fabio Bering;  Location: [facility];  Service: General;  Laterality: N/A;   COLONOSCOPY  05/22/2011   Procedure: COLONOSCOPY;  Surgeon: Malissa Hippo, MD;  Location: [facility];  Service: Endoscopy;  Laterality: N/A;  1045   CYSTOSCOPY WITH RETROGRADE PYELOGRAM, URETEROSCOPY AND STENT PLACEMENT Right 05/09/2019   Procedure: CYSTOSCOPY WITH RETROGRADE PYELOGRAM, URETEROSCOPY AND STENT PLACEMENT;  Surgeon: Noel Christmas, MD;  Location[facility];  Service: Urology;  Laterality: Right;  1 HR   CYSTOSCOPY WITH STENT PLACEMENT Right 04/03/2019   Procedure: CYSTOSCOPY, RIGHT RETROGRADE PYELOGRAM  WITH STENT PLACEMENT;  Surgeon: Noel Christmas, MD;  Location: [facility];  Service: Urology;  Laterality: Right;   EXCISION BONE CYST Right 2010   RIGHT SHOULDER   GANGLION CYST EXCISION Left 06/02/2012   Procedure: Excision ganglion left dorsal wrist;  Surgeon: Darreld Mclean, MD;  Location: [facility];  Service: Orthopedics;  Laterality: Left;   HOLMIUM LASER APPLICATION Right 05/09/2019   Procedure: HOLMIUM LASER APPLICATION;  Surgeon: Noel Christmas, MD;  Location: [facility];  Service: Urology;  Laterality: Right;   kidney stones     LUMBAR MICRODISCECTOMY  07-16-2000   @[facility]    L 2  -3   POSTERIOR LAMINECTOMY / DECOMPRESSION LUMBAR SPINE  06-26-2009   @[facility]    w/ LATERAL MASS FUSION , L3 -- 5   SHOULDER ARTHROSCOPY WITH DISTAL CLAVICLE RESECTION Right 01-10-2009   @[facility]    w/ DEBRIDEMENT AND EXCISION RECURRENT GANGLION CYST   Family History  Problem Relation Age of Onset   Stroke Mother    Cancer Father    Diabetes Sister    Diabetes Sister    Kidney failure Brother    Kidney  failure Brother    Pancreatic cancer Sister    Kidney Stones Sister        Only one Kidney   Arthritis Sister    Heart disease Daughter    Social History   Socioeconomic History   Marital status: Widowed    Spouse name: Not on file   Number of children: 1   Years of education: Not on file   Highest education level: Not on file  Occupational History   Occupation: Retired    Comment: [employer]  Tobacco Use   Smoking status: Never    Passive exposure: Past   Smokeless tobacco: Never  Vaping Use   Vaping status: Never Used  Substance and Sexual Activity   Alcohol use: No   Drug use: Never   Sexual activity: Not Currently    Birth control/protection: Post-menopausal  Other Topics Concern   Not on file  Social History Narrative   Husband passed away 04-30-2016. It has been financially harder to pay the bills since his passing, but she is making it. Has grandchildren around which makes it easier.    Social Determinants of Health   Financial Resource Strain: Low Risk  (08/05/2022)   Overall Financial Resource Strain (CARDIA)    Difficulty of Paying Living Expenses: Not hard at all  Food Insecurity: No Food Insecurity (08/05/2022)   Hunger Vital Sign    Worried About Running Out of Food in the Last Year: Never true    Ran Out of Food in the Last Year: Never true  Transportation Needs: No Transportation Needs (08/05/2022)   PRAPARE - Administrator, Civil Service (Medical): No    Lack of Transportation (Non-Medical): No  Physical Activity: Insufficiently Active (08/05/2022)   Exercise Vital Sign    Days of Exercise per Week: 3 days    Minutes of Exercise per Session: 10 min  Stress: No Stress Concern Present (08/05/2022)   [employer] of Occupational Health - Occupational Stress Questionnaire    Feeling of Stress : Not at all  Social Connections: Socially Integrated (08/05/2022)   Social Connection and Isolation Panel [NHANES]    Frequency of Communication  with Friends and Family: More than three times a week  Frequency of Social Gatherings with Friends and Family: More than three times a week    Attends Religious Services: More than 4 times per year    Active Member of Clubs or Organizations: Yes    Attends Engineer, structural: More than 4 times per year    Marital Status: Married    Tobacco Counseling Counseling given: Yes   Clinical Intake:  Pre-visit preparation completed: Yes  Pain : No/denies pain Pain Score: 0-No pain     BMI - recorded: 27.21 Nutritional Status: BMI 25 -29 Overweight Nutritional Risks: None Diabetes: No  How often do you need to have someone help you when you read instructions, pamphlets, or other written materials from your doctor or pharmacy?: 1 - Never  Interpreter Needed?: No  Information entered by :: Abby Caellum Mancil, CMA   Activities of Daily Living    08/05/2022    1:14 PM  In your present state of health, do you have any difficulty performing the following activities:  Hearing? 0  Vision? 0  Difficulty concentrating or making decisions? 0  Walking or climbing stairs? 1  Comment uses a cane  Dressing or bathing? 0  Doing errands, shopping? 0  Preparing Food and eating ? N  Using the Toilet? N  In the past six months, have you accidently leaked urine? N  Do you have problems with loss of bowel control? N  Managing your Medications? N  Managing your Finances? N  Housekeeping or managing your Housekeeping? Y  Comment grandchildren help her    Patient Care Team: Kerri Perches, MD as PCP - General Branch, Dorothe Pea, MD as PCP - Cardiology (Cardiology) Lauris Poag, MD as Consulting Physician (Nephrology) Pollyann Savoy, MD as Consulting Physician (Rheumatology) Gavin Pound, [facility] (Inactive) (Pharmacist) Audrie Gallus, RN as [facility] Care Management  Indicate any recent Medical Services you may have received from other than [facility]  providers in the past year (date may be approximate).     Assessment:   This is a routine wellness examination for Leda.  Hearing/Vision screen Hearing Screening - Comments:: Patient denies any hearing difficulties.    Dietary issues and exercise activities discussed:     Goals Addressed             This Visit's Progress    Patient Stated       Remain active and maintain her independence. To have a prosperous year.        Depression Screen    08/05/2022    1:08 PM 06/18/2022    3:52 PM 01/22/2022    3:53 PM 05/27/2021   12:49 PM 05/26/2021    9:20 AM 05/19/2021   10:24 AM 03/13/2021   10:06 AM  PHQ 2/9 Scores  PHQ - 2 Score 0 3 4 0 0 0 0  PHQ- 9 Score 0 4 5        Fall Risk    06/18/2022    3:52 PM 05/27/2022    2:21 PM 01/22/2022    3:53 PM 05/27/2021   12:49 PM 05/26/2021    9:20 AM  Fall Risk   Falls in the past year? 0 0 0 0 0  Number falls in past yr: 0 0 0  0  Injury with Fall? 0 0 0  0  Risk for fall due to : No Fall Risks  No Fall Risks  No Fall Risks  Follow up Falls evaluation completed  Falls evaluation completed  Falls  evaluation completed    MEDICARE RISK AT HOME:  Medicare Risk at Home - 08/05/22 1312     Any stairs in or around the home? No    If so, are there any without handrails? No    Home free of loose throw rugs in walkways, pet beds, electrical cords, etc? Yes    Adequate lighting in your home to reduce risk of falls? Yes    Life alert? No    Use of a cane, walker or w/c? Yes    Grab bars in the bathroom? Yes    Shower chair or bench in shower? Yes    Elevated toilet seat or a handicapped toilet? Yes             TIMED UP AND GO:  Was the test performed?  No    Cognitive Function:    07/17/2020    2:08 PM 05/01/2020   10:44 AM  MMSE - Mini Mental State Exam  Not completed: Unable to complete   Orientation to time  5  Orientation to Place  5  Registration  3  Attention/ Calculation  5  Recall  2  Language- name 2  objects  2  Language- repeat  1  Language- follow 3 step command  3  Language- read & follow direction  1  Write a sentence  1  Copy design  1  Total score  29        08/05/2022    1:05 PM 07/18/2021    2:31 PM 07/17/2020    2:09 PM 07/17/2019    9:56 AM 05/27/2018    8:02 AM  6CIT Screen  What Year? 0 points 0 points  0 points 0 points  What month? 0 points 0 points  0 points 0 points  What time? 0 points 0 points 0 points 0 points 0 points  Count back from 20 0 points 0 points 0 points 0 points 0 points  Months in reverse 2 points 2 points 2 points 2 points 2 points  Repeat phrase 0 points 0 points 2 points 0 points 0 points  Total Score 2 points 2 points  2 points 2 points    Immunizations Immunization History  Administered Date(s) Administered   Fluad Quad(high Dose 65+) 10/19/2019, 10/01/2021   Influenza Split 11/07/2013   Influenza Whole 10/28/2009, 09/17/2010   Influenza, High Dose Seasonal PF 09/29/2016, 09/15/2018   Influenza,inj,Quad PF,6+ Mos 09/03/2014   Influenza-Unspecified 10/05/2013, 09/29/2016   Moderna Sars-Covid-2 Vaccination 01/17/2019, 02/27/2019, 10/30/2019, 10/19/2021   PPD Test 05/14/2014, 05/28/2015   Pneumococcal Conjugate-13 08/03/2013   Pneumococcal Polysaccharide-23 08/28/2009   Tdap 04/29/2010    TDAP status: Due, Education has been provided regarding the importance of this vaccine. Advised may receive this vaccine at local pharmacy or Health Dept. Aware to provide a copy of the vaccination record if obtained from local pharmacy or Health Dept. Verbalized acceptance and understanding.  Flu Vaccine status: Up to date  Pneumococcal vaccine status: Up to date  Covid-19 vaccine status: Information provided on how to obtain vaccines.   Qualifies for Shingles Vaccine? Yes   Zostavax completed No   Shingrix Completed?: No.    Education has been provided regarding the importance of this vaccine. Patient has been advised to call insurance company to  determine out of pocket expense if they have not yet received this vaccine. Advised may also receive vaccine at local pharmacy or Health Dept. Verbalized acceptance and understanding.  Screening Tests Health Maintenance  Topic Date Due   DTaP/Tdap/Td (2 - Td or Tdap) 04/28/2020   COVID-19 Vaccine (7 - 2023-24 season) 12/14/2021   INFLUENZA VACCINE  08/06/2022   Medicare Annual Wellness (AWV)  01/23/2023   Pneumonia Vaccine 29+ Years old  Completed   DEXA SCAN  Completed   HPV VACCINES  Aged Out   Zoster Vaccines- Shingrix  Discontinued    Health Maintenance  Health Maintenance Due  Topic Date Due   DTaP/Tdap/Td (2 - Td or Tdap) 04/28/2020   COVID-19 Vaccine (7 - 2023-24 season) 12/14/2021    Colorectal cancer screening: No longer required.   Mammogram Status: Scheduled for August 12, 2022 at 10:15 am  Bone Density status: Completed 08/06/2021. Results reflect: Bone density results: OSTEOPENIA. Repeat every 2 years.  Lung Cancer Screening: (Low Dose CT Chest recommended if Age 79-80 years, 20 pack-year currently smoking OR have quit w/in 15years.) does not qualify.     Additional Screening:  Hepatitis C Screening: does not qualify;   Vision Screening: Recommended annual ophthalmology exams for early detection of glaucoma and other disorders of the eye. Is the patient up to date with their annual eye exam?  Yes  Who is the provider or what is the name of the office in which the patient attends annual eye exams? Dr. Charise Killian w [facility]   Dental Screening: Recommended annual dental exams for proper oral hygiene  Diabetic Foot Exam: n/a  Community Resource Referral / Chronic Care Management: CRR required this visit?  No   CCM required this visit?  No     Plan:     I have personally reviewed and noted the following in the patient's chart:   Medical and social history Use of alcohol, tobacco or illicit drugs  Current medications and supplements including opioid  prescriptions. Patient is not currently taking opioid prescriptions. Functional ability and status Nutritional status Physical activity Advanced directives List of other physicians Hospitalizations, surgeries, and ER visits in previous 12 months Vitals Screenings to include cognitive, depression, and falls Referrals and appointments  In addition, I have reviewed and discussed with patient certain preventive protocols, quality metrics, and best practice recommendations. A written personalized care plan for preventive services as well as general preventive health recommendations were provided to patient.     Jordan Hawks Elif Yonts, CMA   08/05/2022   After Visit Summary: (Mail) Due to this being a telephonic visit, the after visit summary with patients personalized plan was offered to patient via mail   Nurse Notes: Patient was very pleasant

## 2022-08-05 NOTE — Patient Instructions (Signed)
Stacy Meyers , Thank you for taking time to come for your Medicare Wellness Visit. I appreciate your ongoing commitment to your health goals. Please review the following plan we discussed and let me know if I can assist you in the future.   These are the goals we discussed:  Goals      Patient Stated     Get mammogram and bone density on Aug 2 at 10:30am     Patient Stated     Remain active and maintain her independence. To have a prosperous year.      Prevent falls     Chair rise exercises mailed         This is a list of the screening recommended for you and due dates:  Health Maintenance  Topic Date Due   DTaP/Tdap/Td vaccine (2 - Td or Tdap) 04/28/2020   COVID-19 Vaccine (7 - 2023-24 season) 12/14/2021   Flu Shot  08/06/2022   Medicare Annual Wellness Visit  08/05/2023   Pneumonia Vaccine  Completed   DEXA scan (bone density measurement)  Completed   HPV Vaccine  Aged Out   Zoster (Shingles) Vaccine  Discontinued    Advanced directives: Advance directive discussed with you today. Even though you declined this today, please call our office should you change your mind, and we can give you the proper paperwork for you to fill out. Advance care planning is a way to make decisions about medical care that fits your values in case you are ever unable to make these decisions for yourself.  Information on Advanced Care Planning can be found at [facility] Advance Health Care Directives Advance Health Care Directives ([email or web address]    Conditions/risks identified:  You are due for the vaccines checked below. You may have these done at your preferred pharmacy. Please have them fax the office proof of the vaccines so that we can update your chart.   [x]  Flu (due annually) []  Shingrix (Shingles vaccine) []  Pneumonia Vaccines [x]  TDAP (Tetanus) Vaccine every 10 years [x]  Covid-19   Next appointment: VIRTUAL/TELEPHONE APPOINTMENT Follow up in one year for your annual  wellness visit  September 15, 2023 at 3:00pm telephone visit.    Preventive Care 5 Years and Older, Female Preventive care refers to lifestyle choices and visits with your health care provider that can promote health and wellness. What does preventive care include? A yearly physical exam. This is also called an annual well check. Dental exams once or twice a year. Routine eye exams. Ask your health care provider how often you should have your eyes checked. Personal lifestyle choices, including: Daily care of your teeth and gums. Regular physical activity. Eating a healthy diet. Avoiding tobacco and drug use. Limiting alcohol use. Practicing safe sex. Taking low-dose aspirin every day. Taking vitamin and mineral supplements as recommended by your health care provider. What happens during an annual well check? The services and screenings done by your health care provider during your annual well check will depend on your age, overall health, lifestyle risk factors, and family history of disease. Counseling  Your health care provider may ask you questions about your: Alcohol use. Tobacco use. Drug use. Emotional well-being. Home and relationship well-being. Sexual activity. Eating habits. History of falls. Memory and ability to understand (cognition). Work and work Astronomer. Reproductive health. Screening  You may have the following tests or measurements: Height, weight, and BMI. Blood pressure. Lipid and cholesterol levels. These may be checked every 5 years,  or more frequently if you are over 30 years old. Skin check. Lung cancer screening. You may have this screening every year starting at age 27 if you have a 30-pack-year history of smoking and currently smoke or have quit within the past 15 years. Fecal occult blood test (FOBT) of the stool. You may have this test every year starting at age 75. Flexible sigmoidoscopy or colonoscopy. You may have a sigmoidoscopy every 5  years or a colonoscopy every 10 years starting at age 50. Hepatitis C blood test. Hepatitis B blood test. Sexually transmitted disease (STD) testing. Diabetes screening. This is done by checking your blood sugar (glucose) after you have not eaten for a while (fasting). You may have this done every 1-3 years. Bone density scan. This is done to screen for osteoporosis. You may have this done starting at age 29. Mammogram. This may be done every 1-2 years. Talk to your health care provider about how often you should have regular mammograms. Talk with your health care provider about your test results, treatment options, and if necessary, the need for more tests. Vaccines  Your health care provider may recommend certain vaccines, such as: Influenza vaccine. This is recommended every year. Tetanus, diphtheria, and acellular pertussis (Tdap, Td) vaccine. You may need a Td booster every 10 years. Zoster vaccine. You may need this after age 71. Pneumococcal 13-valent conjugate (PCV13) vaccine. One dose is recommended after age 28. Pneumococcal polysaccharide (PPSV23) vaccine. One dose is recommended after age 74. Talk to your health care provider about which screenings and vaccines you need and how often you need them. This information is not intended to replace advice given to you by your health care provider. Make sure you discuss any questions you have with your health care provider. Document Released: 01/18/2015 Document Revised: 09/11/2015 Document Reviewed: 10/23/2014 Elsevier Interactive Patient Education  2017 [employer].  Fall Prevention in the Home Falls can cause injuries. They can happen to people of all ages. There are many things you can do to make your home safe and to help prevent falls. What can I do on the outside of my home? Regularly fix the edges of walkways and driveways and fix any cracks. Remove anything that might make you trip as you walk through a door, such as a raised  step or threshold. Trim any bushes or trees on the path to your home. Use bright outdoor lighting. Clear any walking paths of anything that might make someone trip, such as rocks or tools. Regularly check to see if handrails are loose or broken. Make sure that both sides of any steps have handrails. Any raised decks and porches should have guardrails on the edges. Have any leaves, snow, or ice cleared regularly. Use sand or salt on walking paths during winter. Clean up any spills in your garage right away. This includes oil or grease spills. What can I do in the bathroom? Use night lights. Install grab bars by the toilet and in the tub and shower. Do not use towel bars as grab bars. Use non-skid mats or decals in the tub or shower. If you need to sit down in the shower, use a plastic, non-slip stool. Keep the floor dry. Clean up any water that spills on the floor as soon as it happens. Remove soap buildup in the tub or shower regularly. Attach bath mats securely with double-sided non-slip rug tape. Do not have throw rugs and other things on the floor that can make you trip.  What can I do in the bedroom? Use night lights. Make sure that you have a light by your bed that is easy to reach. Do not use any sheets or blankets that are too big for your bed. They should not hang down onto the floor. Have a firm chair that has side arms. You can use this for support while you get dressed. Do not have throw rugs and other things on the floor that can make you trip. What can I do in the kitchen? Clean up any spills right away. Avoid walking on wet floors. Keep items that you use a lot in easy-to-reach places. If you need to reach something above you, use a strong step stool that has a grab bar. Keep electrical cords out of the way. Do not use floor polish or wax that makes floors slippery. If you must use wax, use non-skid floor wax. Do not have throw rugs and other things on the floor that can  make you trip. What can I do with my stairs? Do not leave any items on the stairs. Make sure that there are handrails on both sides of the stairs and use them. Fix handrails that are broken or loose. Make sure that handrails are as long as the stairways. Check any carpeting to make sure that it is firmly attached to the stairs. Fix any carpet that is loose or worn. Avoid having throw rugs at the top or bottom of the stairs. If you do have throw rugs, attach them to the floor with carpet tape. Make sure that you have a light switch at the top of the stairs and the bottom of the stairs. If you do not have them, ask someone to add them for you. What else can I do to help prevent falls? Wear shoes that: Do not have high heels. Have rubber bottoms. Are comfortable and fit you well. Are closed at the toe. Do not wear sandals. If you use a stepladder: Make sure that it is fully opened. Do not climb a closed stepladder. Make sure that both sides of the stepladder are locked into place. Ask someone to hold it for you, if possible. Clearly mark and make sure that you can see: Any grab bars or handrails. First and last steps. Where the edge of each step is. Use tools that help you move around (mobility aids) if they are needed. These include: Canes. Walkers. Scooters. Crutches. Turn on the lights when you go into a dark area. Replace any light bulbs as soon as they burn out. Set up your furniture so you have a clear path. Avoid moving your furniture around. If any of your floors are uneven, fix them. If there are any pets around you, be aware of where they are. Review your medicines with your doctor. Some medicines can make you feel dizzy. This can increase your chance of falling. Ask your doctor what other things that you can do to help prevent falls. This information is not intended to replace advice given to you by your health care provider. Make sure you discuss any questions you have with  your health care provider. Document Released: 10/18/2008 Document Revised: 05/30/2015 Document Reviewed: 01/26/2014 Elsevier Interactive Patient Education  2017 [employer].

## 2022-08-12 ENCOUNTER — Ambulatory Visit (HOSPITAL_COMMUNITY): Payer: Medicare PPO

## 2022-08-17 ENCOUNTER — Other Ambulatory Visit: Payer: Self-pay | Admitting: Family Medicine

## 2022-08-17 DIAGNOSIS — J301 Allergic rhinitis due to pollen: Secondary | ICD-10-CM

## 2022-08-21 ENCOUNTER — Other Ambulatory Visit: Payer: Self-pay | Admitting: Family Medicine

## 2022-08-21 ENCOUNTER — Other Ambulatory Visit: Payer: Self-pay | Admitting: Physician Assistant

## 2022-08-21 DIAGNOSIS — E785 Hyperlipidemia, unspecified: Secondary | ICD-10-CM

## 2022-08-21 DIAGNOSIS — M0579 Rheumatoid arthritis with rheumatoid factor of multiple sites without organ or systems involvement: Secondary | ICD-10-CM

## 2022-08-26 ENCOUNTER — Ambulatory Visit (HOSPITAL_COMMUNITY)
Admission: RE | Admit: 2022-08-26 | Discharge: 2022-08-26 | Disposition: A | Discharge status: Home / Self Care | Payer: Medicare PPO | Source: Ambulatory Visit | Attending: Family Medicine | Admitting: Family Medicine

## 2022-08-26 DIAGNOSIS — Z1231 Encounter for screening mammogram for malignant neoplasm of breast: Secondary | ICD-10-CM | POA: Diagnosis not present

## 2022-08-26 NOTE — Progress Notes (Deleted)
Office Visit Note  Patient: Stacy Meyers             Date of Birth: 1932-06-19           MRN: [id number]             PCP: Kerri Perches, MD Referring: Kerri Perches, MD Visit Date: 09/09/2022 Occupation: @GUAROCC @  Subjective:  No chief complaint on file.   History of Present Illness: Stacy Meyers is a 89 y.o. female ***     Activities of Daily Living:  Patient reports morning stiffness for *** {minute/hour:19697}.   Patient {ACTIONS;DENIES/REPORTS:21021675::"Denies"} nocturnal pain.  Difficulty dressing/grooming: {ACTIONS;DENIES/REPORTS:21021675::"Denies"} Difficulty climbing stairs: {ACTIONS;DENIES/REPORTS:21021675::"Denies"} Difficulty getting out of chair: {ACTIONS;DENIES/REPORTS:21021675::"Denies"} Difficulty using hands for taps, buttons, cutlery, and/or writing: {ACTIONS;DENIES/REPORTS:21021675::"Denies"}  No Rheumatology ROS completed.   PMFS History:  Patient Active Problem List   Diagnosis Date Noted   Acute otitis media 05/27/2022   Seasonal allergic rhinitis due to pollen 05/27/2022   Encounter for Medicare annual examination with abnormal findings 01/22/2022   Reduced vision 01/22/2022   New onset of headaches 01/22/2022   Age related osteoporosis 02/07/2021   Acute cystitis without hematuria 10/27/2020   Mass of left lower leg 10/24/2020   Headache 08/26/2020   Dysuria 05/03/2020   Prediabetes 10/28/2019   H/o Chronic heart failure with preserved ejection fraction (HFpEF) (HCC) 04/03/2019   Umbilical hernia without obstruction and without gangrene 12/10/2018   Fibroids 05/08/2016   Spondylosis of lumbar region without myelopathy or radiculopathy 12/24/2015   Solitary bone cyst of right shoulder 05/28/2015   Right knee pain 05/28/2015   Osteoarthritis of both shoulders 05/28/2015   Overweight (BMI 25.0-29.9) 04/28/2014   Ganglion cyst of wrist 05/11/2012   Systolic murmur 04/16/2011   Diastolic dysfunction 12/15/2010   Hypercalcemia  12/14/2010   Seasonal allergies 08/28/2010   Herpes simplex virus (HSV) infection 09/16/2008   Rheumatoid arthritis with rheumatoid factor of multiple sites without organ or systems involvement (HCC) 09/16/2008   Mixed hyperlipidemia 05/23/2008   Essential hypertension, benign 05/23/2008    Past Medical History:  Diagnosis Date   Acid reflux    Acute cystitis with hematuria 04/11/2011   Anemia    ARF (acute renal failure) (HCC) 12/14/2010   Arthritis    CKD (chronic kidney disease), stage II    previously seen by nephrologist--- dr Lowell Guitar,  pt released per lov in epic 09-24-2017   Diabetes mellitus, type II (HCC)    Diverticulosis of colon    Essential hypertension, benign    followed by pcp   (pt had nuclear stress test (in epic under media) dated 05-25-2008 showed normal perfusion w/ no evidence ischemia, ef 83%   Heart disease    History of bradycardia    previously seen by cardiologist, dr branch, pt released per lov 08-11-2018 in epic, bradycardia resolved   History of sepsis    04-03-2019 hospital admission w/ urosepsis due to obstructive uropathy due to right ureter stone   Hypokalemia    Lipoma of colon    ascending   Lumbar spondylosis    Mixed hyperlipidemia    Nephrolithiasis    per CT 04-03-2019 nonobstructive bilateral stones   Ocular herpes    Osteoarthritis    followed by dr Corliss Skains--- hands, shoulders, feet   Osteoporosis    Rheumatoid arthritis(714.0)    rheumotologist-- dr s. Corliss Skains---  multiple sites, treated with methotrexate/ prednisone   Right nephrolithiasis-1.4cm Moderate right-sided urinary tract obstruction/hydronephrosis 04/03/2019   Right ureteral  calculus    Seasonal allergies    Sepsis (HCC) 04/03/2019   Sepsis secondary to UTI /Nephrolithiasis with obstructive uropathy 04/03/2019   SUI (stress urinary incontinence, female)    Uterine fibroid     Family History  Problem Relation Age of Onset   Stroke Mother    Cancer Father    Diabetes  Sister    Diabetes Sister    Kidney failure Brother    Kidney failure Brother    Pancreatic cancer Sister    Kidney Stones Sister        Only one Kidney   Arthritis Sister    Heart disease Daughter    Past Surgical History:  Procedure Laterality Date   BUNIONECTOMY Right 2012   CATARACT EXTRACTION W/ INTRAOCULAR LENS  IMPLANT, BILATERAL  2009   CHOLECYSTECTOMY  12/17/2010   Procedure: LAPAROSCOPIC CHOLECYSTECTOMY;  Surgeon: Fabio Bering;  Location: [facility];  Service: General;  Laterality: N/A;   COLONOSCOPY  05/22/2011   Procedure: COLONOSCOPY;  Surgeon: Malissa Hippo, MD;  Location: [facility];  Service: Endoscopy;  Laterality: N/A;  1045   CYSTOSCOPY WITH RETROGRADE PYELOGRAM, URETEROSCOPY AND STENT PLACEMENT Right 05/09/2019   Procedure: CYSTOSCOPY WITH RETROGRADE PYELOGRAM, URETEROSCOPY AND STENT PLACEMENT;  Surgeon: Noel Christmas, MD;  [facility];  Service: Urology;  Laterality: Right;  1 HR   CYSTOSCOPY WITH STENT PLACEMENT Right 04/03/2019   Procedure: CYSTOSCOPY, RIGHT RETROGRADE PYELOGRAM  WITH STENT PLACEMENT;  Surgeon: Noel Christmas, MD;  Location: [facility];  Service: Urology;  Laterality: Right;   EXCISION BONE CYST Right 2010   RIGHT SHOULDER   GANGLION CYST EXCISION Left 06/02/2012   Procedure: Excision ganglion left dorsal wrist;  Surgeon: Darreld Mclean, MD;  Location: [facility];  Service: Orthopedics;  Laterality: Left;   HOLMIUM LASER APPLICATION Right 05/09/2019   Procedure: HOLMIUM LASER APPLICATION;  Surgeon: Noel Christmas, MD;  Location[facility];  Service: Urology;  Laterality: Right;   kidney stones     LUMBAR MICRODISCECTOMY  07-16-2000   @[facility]    L 2  -3   POSTERIOR LAMINECTOMY / DECOMPRESSION LUMBAR SPINE  06-26-2009   @[facility]    w/ LATERAL MASS FUSION , L3 -- 5   SHOULDER ARTHROSCOPY WITH DISTAL CLAVICLE RESECTION Right 01-10-2009   @[facility]    w/ DEBRIDEMENT AND EXCISION RECURRENT GANGLION CYST   Social History    Social History Narrative   Husband passed away 08-May-2016. It has been financially harder to pay the bills since his passing, but she is making it. Has grandchildren around which makes it easier.    Immunization History  Administered Date(s) Administered   Fluad Quad(high Dose 65+) 10/19/2019, 10/01/2021   Influenza Split 11/07/2013   Influenza Whole 10/28/2009, 09/17/2010   Influenza, High Dose Seasonal PF 09/29/2016, 09/15/2018   Influenza,inj,Quad PF,6+ Mos 09/03/2014   Influenza-Unspecified 10/05/2013, 09/29/2016   Moderna Sars-Covid-2 Vaccination 01/17/2019, 02/27/2019, 10/30/2019, 10/19/2021   PPD Test 05/14/2014, 05/28/2015   Pneumococcal Conjugate-13 08/03/2013   Pneumococcal Polysaccharide-23 08/28/2009   Tdap 04/29/2010     Objective: Vital Signs: There were no vitals taken for this visit.   Physical Exam   Musculoskeletal Exam: ***  CDAI Exam: CDAI Score: -- Patient Global: --; Provider Global: -- Swollen: --; Tender: -- Joint Exam 09/09/2022   No joint exam has been documented for this visit   There is currently no information documented on the homunculus. Go to the Rheumatology activity and complete the  homunculus joint exam.  Investigation: No additional findings.  Imaging: No results found.  Recent Labs: Lab Results  Component Value Date   WBC 7.0 04/08/2022   HGB 12.2 04/08/2022   PLT 258 04/08/2022   NA 144 06/22/2022   K 3.5 06/22/2022   CL 103 06/22/2022   CO2 25 06/22/2022   GLUCOSE 141 (H) 06/22/2022   BUN 20 06/22/2022   CREATININE 0.87 06/22/2022   BILITOT 0.4 06/22/2022   ALKPHOS 66 06/22/2022   AST 12 06/22/2022   ALT 8 06/22/2022   PROT 6.4 06/22/2022   ALBUMIN 3.9 06/22/2022   CALCIUM 9.9 06/22/2022   GFRAA 70 03/12/2020    Speciality Comments: No specialty comments available.  Procedures:  No procedures performed Allergies: Aspirin and Sudafed [pseudoephedrine hcl]   Assessment / Plan:     Visit Diagnoses:  Rheumatoid arthritis with rheumatoid factor of multiple sites without organ or systems involvement (HCC)  High risk medication use  Primary osteoarthritis of both hands  Primary osteoarthritis of both shoulders  Primary osteoarthritis of both knees  Primary osteoarthritis of both feet  Spondylosis of lumbar region without myelopathy or radiculopathy  Age-related osteoporosis without current pathological fracture  Type 2 diabetes, diet controlled (HCC)  Essential hypertension  Systolic murmur  Orders: No orders of the defined types were placed in this encounter.  No orders of the defined types were placed in this encounter.   Face-to-face time spent with patient was *** minutes. Greater than 50% of time was spent in counseling and coordination of care.  Follow-Up Instructions: No follow-ups on file.   Gearldine Bienenstock, PA-C  Note - This record has been created using Dragon software.  Chart creation errors have been sought, but may not always  have been located. Such creation errors do not reflect on  the standard of medical care.

## 2022-08-27 ENCOUNTER — Other Ambulatory Visit (HOSPITAL_COMMUNITY): Payer: Self-pay | Admitting: Family Medicine

## 2022-08-27 DIAGNOSIS — R928 Other abnormal and inconclusive findings on diagnostic imaging of breast: Secondary | ICD-10-CM

## 2022-09-01 DIAGNOSIS — M810 Age-related osteoporosis without current pathological fracture: Secondary | ICD-10-CM | POA: Diagnosis not present

## 2022-09-01 DIAGNOSIS — E782 Mixed hyperlipidemia: Secondary | ICD-10-CM | POA: Diagnosis not present

## 2022-09-02 LAB — COMPREHENSIVE METABOLIC PANEL: Chloride: 106 mmol/L (ref 96–106)

## 2022-09-08 NOTE — Progress Notes (Unsigned)
Office Visit Note  Patient: Stacy Meyers             Date of Birth: [birth year]           MRN: [id number]             PCP: Kerri Perches, MD Referring: Kerri Perches, MD Visit Date: 09/21/2022 Occupation: @GUAROCC @  Subjective:  No chief complaint on file.   History of Present Illness: Stacy Meyers is a 89 y.o. female ***     Activities of Daily Living:  Patient reports morning stiffness for *** {minute/hour:19697}.   Patient {ACTIONS;DENIES/REPORTS:21021675::"Denies"} nocturnal pain.  Difficulty dressing/grooming: {ACTIONS;DENIES/REPORTS:21021675::"Denies"} Difficulty climbing stairs: {ACTIONS;DENIES/REPORTS:21021675::"Denies"} Difficulty getting out of chair: {ACTIONS;DENIES/REPORTS:21021675::"Denies"} Difficulty using hands for taps, buttons, cutlery, and/or writing: {ACTIONS;DENIES/REPORTS:21021675::"Denies"}  No Rheumatology ROS completed.   PMFS History:  Patient Active Problem List   Diagnosis Date Noted   Acute otitis media 05/27/2022   Seasonal allergic rhinitis due to pollen 05/27/2022   Encounter for Medicare annual examination with abnormal findings 01/22/2022   Reduced vision 01/22/2022   New onset of headaches 01/22/2022   Age related osteoporosis 02/07/2021   Acute cystitis without hematuria 10/27/2020   Mass of left lower leg 10/24/2020   Headache 08/26/2020   Dysuria 05/03/2020   Prediabetes 10/28/2019   H/o Chronic heart failure with preserved ejection fraction (HFpEF) (HCC) 04/03/2019   Umbilical hernia without obstruction and without gangrene 12/10/2018   Fibroids 05/08/2016   Spondylosis of lumbar region without myelopathy or radiculopathy 12/24/2015   Solitary bone cyst of right shoulder 05/28/2015   Right knee pain 05/28/2015   Osteoarthritis of both shoulders 05/28/2015   Overweight (BMI 25.0-29.9) 04/28/2014   Ganglion cyst of wrist 05/11/2012   Systolic murmur 04/16/2011   Diastolic dysfunction 12/15/2010   Hypercalcemia  12/14/2010   Seasonal allergies 08/28/2010   Herpes simplex virus (HSV) infection 09/16/2008   Rheumatoid arthritis with rheumatoid factor of multiple sites without organ or systems involvement (HCC) 09/16/2008   Mixed hyperlipidemia 05/23/2008   Essential hypertension, benign 05/23/2008    Past Medical History:  Diagnosis Date   Acid reflux    Acute cystitis with hematuria 04/11/2011   Anemia    ARF (acute renal failure) (HCC) 12/14/2010   Arthritis    CKD (chronic kidney disease), stage II    previously seen by nephrologist--- dr Lowell Guitar,  pt released per lov in epic 09-24-2017   Diabetes mellitus, type II (HCC)    Diverticulosis of colon    Essential hypertension, benign    followed by pcp   (pt had nuclear stress test (in epic under media) dated 05-25-2008 showed normal perfusion w/ no evidence ischemia, ef 83%   Heart disease    History of bradycardia    previously seen by cardiologist, dr branch, pt released per lov 08-11-2018 in epic, bradycardia resolved   History of sepsis    04-03-2019 hospital admission w/ urosepsis due to obstructive uropathy due to right ureter stone   Hypokalemia    Lipoma of colon    ascending   Lumbar spondylosis    Mixed hyperlipidemia    Nephrolithiasis    per CT 04-03-2019 nonobstructive bilateral stones   Ocular herpes    Osteoarthritis    followed by dr Corliss Skains--- hands, shoulders, feet   Osteoporosis    Rheumatoid arthritis(714.0)    rheumotologist-- dr s. Corliss Skains---  multiple sites, treated with methotrexate/ prednisone   Right nephrolithiasis-1.4cm Moderate right-sided urinary tract obstruction/hydronephrosis 04/03/2019   Right ureteral  calculus    Seasonal allergies    Sepsis (HCC) 04/03/2019   Sepsis secondary to UTI /Nephrolithiasis with obstructive uropathy 04/03/2019   SUI (stress urinary incontinence, female)    Uterine fibroid     Family History  Problem Relation Age of Onset   Stroke Mother    Cancer Father    Diabetes  Sister    Diabetes Sister    Kidney failure Brother    Kidney failure Brother    Pancreatic cancer Sister    Kidney Stones Sister        Only one Kidney   Arthritis Sister    Heart disease Daughter    Past Surgical History:  Procedure Laterality Date   BUNIONECTOMY Right 2012   CATARACT EXTRACTION W/ INTRAOCULAR LENS  IMPLANT, BILATERAL  2009   CHOLECYSTECTOMY  12/17/2010   Procedure: LAPAROSCOPIC CHOLECYSTECTOMY;  Surgeon: Fabio Bering;  Location: [facility];  Service: General;  Laterality: N/A;   COLONOSCOPY  05/22/2011   Procedure: COLONOSCOPY;  Surgeon: Malissa Hippo, MD;  Location: [facility];  Service: Endoscopy;  Laterality: N/A;  1045   CYSTOSCOPY WITH RETROGRADE PYELOGRAM, URETEROSCOPY AND STENT PLACEMENT Right 05/09/2019   Procedure: CYSTOSCOPY WITH RETROGRADE PYELOGRAM, URETEROSCOPY AND STENT PLACEMENT;  Surgeon: Noel Christmas, MD;  Location: [facility];  Service: Urology;  Laterality: Right;  1 HR   CYSTOSCOPY WITH STENT PLACEMENT Right 04/03/2019   Procedure: CYSTOSCOPY, RIGHT RETROGRADE PYELOGRAM  WITH STENT PLACEMENT;  Surgeon: Noel Christmas, MD;  Location: [facility];  Service: Urology;  Laterality: Right;   EXCISION BONE CYST Right 2010   RIGHT SHOULDER   GANGLION CYST EXCISION Left 06/02/2012   Procedure: Excision ganglion left dorsal wrist;  Surgeon: Darreld Mclean, MD;  Location: [facility];  Service: Orthopedics;  Laterality: Left;   HOLMIUM LASER APPLICATION Right 05/09/2019   Procedure: HOLMIUM LASER APPLICATION;  Surgeon: Noel Christmas, MD;  Location: [facility];  Service: Urology;  Laterality: Right;   kidney stones     LUMBAR MICRODISCECTOMY  07-16-2000   @[facility]    L 2  -3   POSTERIOR LAMINECTOMY / DECOMPRESSION LUMBAR SPINE  06-26-2009   @[facility]    w/ LATERAL MASS FUSION , L3 -- 5   SHOULDER ARTHROSCOPY WITH DISTAL CLAVICLE RESECTION Right 01-10-2009   @[facility]    w/ DEBRIDEMENT AND EXCISION RECURRENT GANGLION CYST   Social History    Social History Narrative   Husband passed away 25-Apr-2016. It has been financially harder to pay the bills since his passing, but she is making it. Has grandchildren around which makes it easier.    Immunization History  Administered Date(s) Administered   Fluad Quad(high Dose 65+) 10/19/2019, 10/01/2021   Influenza Split 11/07/2013   Influenza Whole 10/28/2009, 09/17/2010   Influenza, High Dose Seasonal PF 09/29/2016, 09/15/2018   Influenza,inj,Quad PF,6+ Mos 09/03/2014   Influenza-Unspecified 10/05/2013, 09/29/2016   Moderna Sars-Covid-2 Vaccination 01/17/2019, 02/27/2019, 10/30/2019, 10/19/2021   PPD Test 05/14/2014, 05/28/2015   Pneumococcal Conjugate-13 08/03/2013   Pneumococcal Polysaccharide-23 08/28/2009   Tdap 04/29/2010     Objective: Vital Signs: There were no vitals taken for this visit.   Physical Exam   Musculoskeletal Exam: ***  CDAI Exam: CDAI Score: -- Patient Global: --; Provider Global: -- Swollen: --; Tender: -- Joint Exam 09/21/2022   No joint exam has been documented for this visit   There is currently no information documented on the homunculus. Go to the Rheumatology activity and complete the  homunculus joint exam.  Investigation: No additional findings.  Imaging: MM 3D SCREENING MAMMOGRAM BILATERAL BREAST  Result Date: 08/27/2022 CLINICAL DATA:  Screening. EXAM: DIGITAL SCREENING BILATERAL MAMMOGRAM WITH TOMOSYNTHESIS AND CAD TECHNIQUE: Bilateral screening digital craniocaudal and mediolateral oblique mammograms were obtained. Bilateral screening digital breast tomosynthesis was performed. The images were evaluated with computer-aided detection. COMPARISON:  Previous exam(s). ACR Breast Density Category b: There are scattered areas of fibroglandular density. FINDINGS: In the left breast, a possible asymmetry warrants further evaluation. In the right breast, no findings suspicious for malignancy. IMPRESSION: Further evaluation is suggested for  possible asymmetry in the left breast. RECOMMENDATION: Diagnostic mammogram and possibly ultrasound of the left breast. (Code:[id number]) The patient will be contacted regarding the findings, and additional imaging will be scheduled. BI-RADS CATEGORY  0: Incomplete: Need additional imaging evaluation. Electronically Signed   By: Emmaline Kluver M.D.   On: 08/27/2022 13:16    Recent Labs: Lab Results  Component Value Date   WBC 7.0 04/08/2022   HGB 12.2 04/08/2022   PLT 258 04/08/2022   NA 144 09/01/2022   K 3.1 (L) 09/01/2022   CL 106 09/01/2022   CO2 26 09/01/2022   GLUCOSE 90 09/01/2022   BUN 15 09/01/2022   CREATININE 0.93 09/01/2022   BILITOT 0.6 09/01/2022   ALKPHOS 55 09/01/2022   AST 15 09/01/2022   ALT 9 09/01/2022   PROT 6.4 09/01/2022   ALBUMIN 4.2 09/01/2022   CALCIUM 10.3 09/01/2022   GFRAA 70 03/12/2020    Speciality Comments: No specialty comments available.  Procedures:  No procedures performed Allergies: Aspirin and Sudafed [pseudoephedrine hcl]   Assessment / Plan:     Visit Diagnoses: Rheumatoid arthritis with rheumatoid factor of multiple sites without organ or systems involvement (HCC)  High risk medication use  Primary osteoarthritis of both hands  Primary osteoarthritis of both shoulders  Primary osteoarthritis of both knees  Primary osteoarthritis of both feet  Spondylosis of lumbar region without myelopathy or radiculopathy  Age-related osteoporosis without current pathological fracture  Type 2 diabetes, diet controlled (HCC)  Essential hypertension  Systolic murmur  Orders: No orders of the defined types were placed in this encounter.  No orders of the defined types were placed in this encounter.   Face-to-face time spent with patient was *** minutes. Greater than 50% of time was spent in counseling and coordination of care.  Follow-Up Instructions: No follow-ups on file.   Gearldine Bienenstock, PA-C  Note - This record has been  created using Dragon software.  Chart creation errors have been sought, but may not always  have been located. Such creation errors do not reflect on  the standard of medical care.

## 2022-09-09 ENCOUNTER — Ambulatory Visit: Payer: Medicare PPO | Admitting: Physician Assistant

## 2022-09-09 ENCOUNTER — Ambulatory Visit (INDEPENDENT_AMBULATORY_CARE_PROVIDER_SITE_OTHER): Payer: Medicare PPO | Admitting: "Endocrinology

## 2022-09-09 ENCOUNTER — Encounter: Payer: Self-pay | Admitting: "Endocrinology

## 2022-09-09 VITALS — BP 130/58 | HR 56 | Ht 59.0 in | Wt 143.6 lb

## 2022-09-09 DIAGNOSIS — M170 Bilateral primary osteoarthritis of knee: Secondary | ICD-10-CM

## 2022-09-09 DIAGNOSIS — E782 Mixed hyperlipidemia: Secondary | ICD-10-CM | POA: Diagnosis not present

## 2022-09-09 DIAGNOSIS — R7303 Prediabetes: Secondary | ICD-10-CM | POA: Diagnosis not present

## 2022-09-09 DIAGNOSIS — R011 Cardiac murmur, unspecified: Secondary | ICD-10-CM

## 2022-09-09 DIAGNOSIS — Z79899 Other long term (current) drug therapy: Secondary | ICD-10-CM

## 2022-09-09 DIAGNOSIS — M0579 Rheumatoid arthritis with rheumatoid factor of multiple sites without organ or systems involvement: Secondary | ICD-10-CM

## 2022-09-09 DIAGNOSIS — M19011 Primary osteoarthritis, right shoulder: Secondary | ICD-10-CM

## 2022-09-09 DIAGNOSIS — M47816 Spondylosis without myelopathy or radiculopathy, lumbar region: Secondary | ICD-10-CM

## 2022-09-09 DIAGNOSIS — I10 Essential (primary) hypertension: Secondary | ICD-10-CM

## 2022-09-09 DIAGNOSIS — M19071 Primary osteoarthritis, right ankle and foot: Secondary | ICD-10-CM

## 2022-09-09 DIAGNOSIS — E876 Hypokalemia: Secondary | ICD-10-CM | POA: Diagnosis not present

## 2022-09-09 DIAGNOSIS — E119 Type 2 diabetes mellitus without complications: Secondary | ICD-10-CM

## 2022-09-09 DIAGNOSIS — M19041 Primary osteoarthritis, right hand: Secondary | ICD-10-CM

## 2022-09-09 DIAGNOSIS — M810 Age-related osteoporosis without current pathological fracture: Secondary | ICD-10-CM

## 2022-09-09 MED ORDER — POTASSIUM CHLORIDE CRYS ER 20 MEQ PO TBCR: 20.0000 meq | EXTENDED_RELEASE_TABLET | Freq: Two times a day (BID) | ORAL | 1 refills | Status: DC

## 2022-09-09 NOTE — Progress Notes (Signed)
09/09/2022         Endocrinology follow-up note   Past Medical History:  Diagnosis Date   Acid reflux    Acute cystitis with hematuria 04/11/2011   Anemia    ARF (acute renal failure) (HCC) 12/14/2010   Arthritis    CKD (chronic kidney disease), stage II    previously seen by nephrologist--- dr Lowell Guitar,  pt released per lov in epic 09-24-2017   Diabetes mellitus, type II (HCC)    Diverticulosis of colon    Essential hypertension, benign    followed by pcp   (pt had nuclear stress test (in epic under media) dated 05-25-2008 showed normal perfusion w/ no evidence ischemia, ef 83%   Heart disease    History of bradycardia    previously seen by cardiologist, dr branch, pt released per lov 08-11-2018 in epic, bradycardia resolved   History of sepsis    04-03-2019 hospital admission w/ urosepsis due to obstructive uropathy due to right ureter stone   Hypokalemia    Lipoma of colon    ascending   Lumbar spondylosis    Mixed hyperlipidemia    Nephrolithiasis    per CT 04-03-2019 nonobstructive bilateral stones   Ocular herpes    Osteoarthritis    followed by dr Corliss Skains--- hands, shoulders, feet   Osteoporosis    Rheumatoid arthritis(714.0)    rheumotologist-- dr s. Corliss Skains---  multiple sites, treated with methotrexate/ prednisone   Right nephrolithiasis-1.4cm Moderate right-sided urinary tract obstruction/hydronephrosis 04/03/2019   Right ureteral calculus    Seasonal allergies    Sepsis (HCC) 04/03/2019   Sepsis secondary to UTI /Nephrolithiasis with obstructive uropathy 04/03/2019   SUI (stress urinary incontinence, female)    Uterine fibroid    Past Surgical History:  Procedure Laterality Date   BUNIONECTOMY Right 2012   CATARACT EXTRACTION W/ INTRAOCULAR LENS  IMPLANT, BILATERAL  2009   CHOLECYSTECTOMY  12/17/2010   Procedure: LAPAROSCOPIC CHOLECYSTECTOMY;  Surgeon: Fabio Bering;  Location: [facility];  Service:  General;  Laterality: N/A;   COLONOSCOPY  05/22/2011   Procedure: COLONOSCOPY;  Surgeon: Malissa Hippo, MD;  Location: [facility];  Service: Endoscopy;  Laterality: N/A;  1045   CYSTOSCOPY WITH RETROGRADE PYELOGRAM, URETEROSCOPY AND STENT PLACEMENT Right 05/09/2019   Procedure: CYSTOSCOPY WITH RETROGRADE PYELOGRAM, URETEROSCOPY AND STENT PLACEMENT;  Surgeon: Noel Christmas, MD;  [facility];  Service: Urology;  Laterality: Right;  1 HR   CYSTOSCOPY WITH STENT PLACEMENT Right 04/03/2019   Procedure: CYSTOSCOPY, RIGHT RETROGRADE PYELOGRAM  WITH STENT PLACEMENT;  Surgeon: Noel Christmas, MD;  Location: [facility];  Service: Urology;  Laterality: Right;   EXCISION BONE CYST Right 2010   RIGHT SHOULDER   GANGLION CYST EXCISION Left 06/02/2012   Procedure: Excision ganglion left dorsal wrist;  Surgeon: Darreld Mclean, MD;  Location: [facility];  Service: Orthopedics;  Laterality: Left;   HOLMIUM LASER APPLICATION Right 05/09/2019   Procedure: HOLMIUM LASER APPLICATION;  Surgeon: Noel Christmas, MD;  Location: [facility];  Service: Urology;  Laterality: Right;   kidney stones     LUMBAR MICRODISCECTOMY  07-16-2000   @[facility]    L 2  -3   POSTERIOR LAMINECTOMY / DECOMPRESSION LUMBAR SPINE  06-26-2009   @[facility]    w/ LATERAL MASS FUSION , L3 -- 5   SHOULDER ARTHROSCOPY WITH DISTAL CLAVICLE RESECTION Right 01-10-2009   @[facility]    w/ DEBRIDEMENT AND EXCISION RECURRENT GANGLION CYST   Social History   Socioeconomic History  Marital status: Widowed    Spouse name: Not on file   Number of children: 1   Years of education: Not on file   Highest education level: Not on file  Occupational History   Occupation: Retired    Comment: [employer]  Tobacco Use   Smoking status: Never    Passive exposure: Past   Smokeless tobacco: Never  Vaping Use   Vaping status: Never Used  Substance and Sexual Activity   Alcohol use: No   Drug use: Never   Sexual activity: Not Currently     Birth control/protection: Post-menopausal  Other Topics Concern   Not on file  Social History Narrative   Husband passed away 04-20-2016. It has been financially harder to pay the bills since his passing, but she is making it. Has grandchildren around which makes it easier.    Social Determinants of Health   Financial Resource Strain: Low Risk  (08/05/2022)   Overall Financial Resource Strain (CARDIA)    Difficulty of Paying Living Expenses: Not hard at all  Food Insecurity: No Food Insecurity (08/05/2022)   Hunger Vital Sign    Worried About Running Out of Food in the Last Year: Never true    Ran Out of Food in the Last Year: Never true  Transportation Needs: No Transportation Needs (08/05/2022)   PRAPARE - Administrator, Civil Service (Medical): No    Lack of Transportation (Non-Medical): No  Physical Activity: Insufficiently Active (08/05/2022)   Exercise Vital Sign    Days of Exercise per Week: 3 days    Minutes of Exercise per Session: 10 min  Stress: No Stress Concern Present (08/05/2022)   [employer] of Occupational Health - Occupational Stress Questionnaire    Feeling of Stress : Not at all  Social Connections: Socially Integrated (08/05/2022)   Social Connection and Isolation Panel [NHANES]    Frequency of Communication with Friends and Family: More than three times a week    Frequency of Social Gatherings with Friends and Family: More than three times a week    Attends Religious Services: More than 4 times per year    Active Member of [employer] or Organizations: Yes    Attends Banker Meetings: More than 4 times per year    Marital Status: Married   Outpatient Encounter Medications as of 09/09/2022  Medication Sig   acetaminophen (TYLENOL) 500 MG tablet Take 1,000 mg by mouth every 6 (six) hours as needed (for pain).    acyclovir (ZOVIRAX) 400 MG tablet TAKE 1 TABLET BY MOUTH TWICE A DAY   albuterol (VENTOLIN HFA) 108 (90 Base) MCG/ACT inhaler  TAKE 2 PUFFS BY MOUTH EVERY 6 HOURS AS NEEDED FOR WHEEZE OR SHORTNESS OF BREATH   alendronate (FOSAMAX) 70 MG tablet TAKE 1 TABLET BY MOUTH EVERY 7 DAYS. TAKE WITH A FULL GLASS OF WATER ON AN EMPTY STOMACH.   amLODipine (NORVASC) 5 MG tablet TAKE 1 TABLET BY MOUTH EVERY DAY   butalbital-acetaminophen-caffeine (FIORICET) 50-325-40 MG tablet Take one tablet by mouth once daily, as needed, for severe headache   cetirizine (ZYRTEC) 10 MG tablet TAKE 1 TABLET BY MOUTH EVERY DAY   fluticasone (FLONASE) 50 MCG/ACT nasal spray SPRAY 2 SPRAYS INTO EACH NOSTRIL EVERY DAY   folic acid (FOLVITE) 1 MG tablet TAKE 1 TABLET BY MOUTH EVERY DAY   gabapentin (NEURONTIN) 100 MG capsule TAKE 1 CAPSULE BY MOUTH AT BEDTIME.   hydrochlorothiazide (MICROZIDE) 12.5 MG capsule TAKE 1 CAPSULE BY  MOUTH EVERY DAY   methotrexate (RHEUMATREX) 2.5 MG tablet TAKE 3 TABLETS BY MOUTH EACH WEEK ON THE SAME DAY.   montelukast (SINGULAIR) 10 MG tablet TAKE 1 TABLET BY MOUTH EVERYDAY AT BEDTIME   potassium chloride SA (KLOR-CON M20) 20 MEQ tablet Take 1 tablet (20 mEq total) by mouth 2 (two) times daily.   predniSONE (DELTASONE) 1 MG tablet TAKE 4 TABLETS (4 MG TOTAL) BY MOUTH DAILY WITH BREAKFAST   promethazine-dextromethorphan (PROMETHAZINE-DM) 6.25-15 MG/5ML syrup Take 5 mLs by mouth 4 (four) times daily as needed for cough.   Propylene Glycol (SYSTANE BALANCE OP) Place 1 drop into both eyes daily as needed (for dry eyes).    rosuvastatin (CRESTOR) 5 MG tablet TAKE 1 TABLET BY MOUTH EVERY OTHER DAY   [DISCONTINUED] KLOR-CON M20 20 MEQ tablet TAKE 1 TABLET BY MOUTH EVERY DAY   [DISCONTINUED] rosuvastatin (CRESTOR) 10 MG tablet Take 1 tablet (10 mg total) by mouth at bedtime.   No facility-administered encounter medications on file as of 09/09/2022.   ALLERGIES: Allergies  Allergen Reactions   Aspirin Other (See Comments)    REACTION: burns stomach (uncoated)   Sudafed [Pseudoephedrine Hcl] Other (See Comments)    Increased  joint pain    VACCINATION STATUS: Immunization History  Administered Date(s) Administered   Fluad Quad(high Dose 65+) 10/19/2019, 10/01/2021   Influenza Split 11/07/2013   Influenza Whole 10/28/2009, 09/17/2010   Influenza, High Dose Seasonal PF 09/29/2016, 09/15/2018   Influenza,inj,Quad PF,6+ Mos 09/03/2014   Influenza-Unspecified 10/05/2013, 09/29/2016   Moderna Sars-Covid-2 Vaccination 01/17/2019, 02/27/2019, 10/30/2019, 10/19/2021   PPD Test 05/14/2014, 05/28/2015   Pneumococcal Conjugate-13 08/03/2013   Pneumococcal Polysaccharide-23 08/28/2009   Tdap 04/29/2010     HPI   Stacy Meyers is 89 y.o. female who presents today for follow-up.  She was seen in consultation for osteoporosis during her last visit. PMD:  Kerri Perches, MD.  Patient was diagnosed with osteoporosis at approximate age of 16 years.   She denies fractures or falls. No dizziness/vertigo/orthostasis. -She came alone today, walking with a cane for disequilibrium-see notes from prior visit.    She has been on Fosamax for at least 5 years.  She continues to tolerate alendronate.    Review of her last 3 bone density studies show that there has been progressive improvement in her bone density hide bone density was from August 2023, confirming the same findings.  She has no new complaints today.  She continues to have mild hypercalcemia associated with high normal PTH.  She denies any prior history of nephrolithiasis nor parathyroid dysfunction. -Review of her medical records indicate a course of hypercalcemia between January and September 2020 which has resolved before her last visit.    No weight bearing exercises.   No history of thyrotoxicosis, she has no new complaints today. She has normal renal function.  Menopause was at 89 y/o.   -She did not report family history of osteoporosis.  She has well-controlled type 2 diabetes.  Because of her history of rheumatoid arthritis, she is on ongoing  methotrexate treatment, and intermittent steroids.  -She reports losing 1 1/2 inches of height over time.  Review of Systems  Limited as above.  Objective:    BP (!) 130/58   Pulse (!) 56   Ht 4\' 11"  (1.499 m)   Wt 143 lb 9.6 oz (65.1 kg)   BMI 29.00 kg/m   Wt Readings from Last 3 Encounters:  09/09/22 143 lb 9.6 oz (65.1 kg)  08/05/22 137 lb (62.1 kg)  06/18/22 134 lb (60.8 kg)    Physical Exam  Constitutional: + normal  weight for height, not in acute distress, normal state of mind, + walks with a cane.   CMP ( most recent) CMP     Component Value Date/Time   NA 144 09/01/2022 1113   K 3.1 (L) 09/01/2022 1113   CL 106 09/01/2022 1113   CO2 26 09/01/2022 1113   GLUCOSE 90 09/01/2022 1113   GLUCOSE 130 (H) 11/06/2021 1339   BUN 15 09/01/2022 1113   CREATININE 0.93 09/01/2022 1113   CREATININE 0.96 (H) 11/06/2021 1339   CALCIUM 10.3 09/01/2022 1113   PROT 6.4 09/01/2022 1113   ALBUMIN 4.2 09/01/2022 1113   AST 15 09/01/2022 1113   ALT 9 09/01/2022 1113   ALKPHOS 55 09/01/2022 1113   BILITOT 0.6 09/01/2022 1113   GFRNONAA 60 03/12/2020 1102   GFRAA 70 03/12/2020 1102     Diabetic Labs (most recent): Lab Results  Component Value Date   HGBA1C 6.7 (H) 06/22/2022   HGBA1C 6.2 (H) 01/22/2022   HGBA1C 6.2 (H) 03/13/2021   MICROALBUR 2.7 02/01/2019   MICROALBUR 51.9 (H) 12/08/2018   MICROALBUR 12.4 (H) 01/17/2018     Lipid Panel ( most recent) Lipid Panel     Component Value Date/Time   CHOL 139 09/01/2022 1113   TRIG 88 09/01/2022 1113   HDL 71 09/01/2022 1113   CHOLHDL 2.0 09/01/2022 1113   CHOLHDL 2.6 10/24/2019 1100   VLDL 26 02/24/2016 0832   LDLCALC 51 09/01/2022 1113   LDLCALC 69 10/24/2019 1100   LABVLDL 17 09/01/2022 1113      Lab Results  Component Value Date   TSH 1.390 03/13/2021   TSH 2.560 02/02/2020   TSH 0.98 10/24/2019   TSH 1.95 01/13/2018   TSH 2.354 01/24/2015   TSH 1.782 07/31/2013   TSH 0.915 04/16/2011   TSH 0.490  12/15/2010   TSH 1.426 03/03/2010   FREET4 1.29 02/02/2020   FREET4 0.97 12/15/2010    Latest Reference Range & Units 01/22/22 16:32 03/06/22 10:07  Glucose 70 - 99 mg/dL  161 (H)  Hemoglobin W9U 4.8 - 5.6 % 6.2 (H)   Est. average glucose Bld gHb Est-mCnc mg/dL 045   PTH, Intact 15 - 65 pg/mL  45  PTH Interp   Comment  (H): Data is abnormally high  Bone density study from August 06, 2021 was reviewed. Assessment: 1. Osteoporosis  2.  Hypokalemia  3.  Hypercalcemia  4.  Hyperlipidemia  Plan: 1. Osteoporosis - likely postmenopausal    Review of her last 3 bone density studies showed that she is benefiting from treatment with alendronate a T score improving on her radius.     We reviewed her DEXA scans together, and I explained that based on the T scores, she is responding to her alendronate.  She continues to tolerate her alendronate 70 mg p.o. weekly.  She is advised to continue on same medication until her next bone density in August 2025.  At that point, she may be considered for drug holiday if she maintains or improves her bone density.  Her serum calcium has corrected.  She is advised to avoid calcium supplement.   She will continue to benefit from low-dose vitamin D .   Her labs continue to show mild hypercalcemia and now much better PTH at 45.     -Hyperkalemia: Not corrected.  I advised her to increase her potassium  supplement to 20 mg twice daily.  -I discussed with her all fall precautions and avoiding vigorous movements to reduce her risk of fracture.  She is encouraged to continue to use her cane to ambulate.  - we discussed about maintaining a good amount of protein in her diet. She will continue to benefit from her statin intervention.  She is advised to continue Crestor 5 mg p.o. nightly.  - I advised patient to maintain close follow up with Kerri Perches, MD for primary care needs.   I spent  25  minutes in the care of the patient today including review of  labs from Thyroid Function, CMP, and other relevant labs ; imaging/biopsy records (current and previous including abstractions from other facilities); face-to-face time discussing  her lab results and symptoms, medications doses, her options of short and long term treatment based on the latest standards of care / guidelines;   and documenting the encounter.  Sherol Dade  participated in the discussions, expressed understanding, and voiced agreement with the above plans.  All questions were answered to her satisfaction. she is encouraged to contact clinic should she have any questions or concerns prior to her return visit.   Follow up plan: Return in about 6 months (around 03/09/2023) for F/U with Pre-visit Labs.   Marquis Lunch, MD [facility] [address] [place], [place] [address] Phone: [phone]  Fax: [phone]     09/09/2022, 12:26 PM  This note was partially dictated with voice recognition software. Similar sounding words can be transcribed inadequately or may not  be corrected upon review.

## 2022-09-14 ENCOUNTER — Ambulatory Visit: Payer: Medicare PPO | Admitting: Neurology

## 2022-09-14 ENCOUNTER — Encounter: Payer: Self-pay | Admitting: Neurology

## 2022-09-14 VITALS — BP 177/69 | HR 53 | Ht 59.0 in | Wt 140.5 lb

## 2022-09-14 DIAGNOSIS — G44229 Chronic tension-type headache, not intractable: Secondary | ICD-10-CM

## 2022-09-14 MED ORDER — GABAPENTIN 100 MG PO CAPS: 100.0000 mg | ORAL_CAPSULE | Freq: Every day | ORAL | 3 refills | Status: AC

## 2022-09-14 MED ORDER — ROSUVASTATIN CALCIUM 5 MG PO TABS: 5.0000 mg | ORAL_TABLET | Freq: Every day | ORAL | 1 refills | Status: DC

## 2022-09-14 NOTE — Patient Instructions (Addendum)
Continue with gabapentin nightly, can consider decreasing the gabapentin to every other day as your headaches improve Decrease Crestor back to 5 mg nightly as your last LDL was 51 Continue your other medications Continue to follow with PCP and return as needed.

## 2022-09-14 NOTE — Progress Notes (Signed)
[facility]  PATIENT: Stacy Meyers DOB: [birth year]  REQUESTING CLINICIAN: Kerri Perches, MD HISTORY FROM: Patient and granddaughter  REASON FOR VISIT: Headaches    HISTORICAL  CHIEF COMPLAINT:  Chief Complaint  Patient presents with   Migraine    Rm12, daughter present  Migraine:0/30 days Ha:1/30 days Triggers:unidentifiable   INTERVAL HISTORY 09/14/2022 Patient presents today for follow-up, she is accompanied by her daughter.  Last visit was in March, and at that time we started her on low-dose gabapentin for chronic headaches.  She reports doing well on gabapentin, in the last couple month she had 1 headache day.  Overall she is doing very well currently no concerns and no additional complaints.   HISTORY OF PRESENT ILLNESS:  This is a 89 year old woman with multiple medical conditions including hypertension, hyperlipidemia, heart disease and kidney disease who is presenting with complaint of headaches.  Patient reports having headaches for the past 6 months, mainly morning headaches.  Headaches are located in the front of her head and they can last hours.  Tylenol usually take care of the headaches.  She has tried Fioricet when her headaches are severe with good result.  She is not taking the medication daily.  She has not been on any preventive medications.  She did have her MRI of her brain done recently, did not show any acute finding but showed multiple small chronic infarct in the bilateral hemisphere likely small vessel disease.  She is not on a daily aspirin but she is taking 5 mg of Crestor every other day.    OTHER MEDICAL CONDITIONS: Hypertension, Hyperlipidemia, Heart disease, Kidney disease    REVIEW OF SYSTEMS: Full 14 system review of systems performed and negative with exception of: As noted in the HPI   ALLERGIES: Allergies  Allergen Reactions   Aspirin Other (See Comments)    REACTION: burns stomach (uncoated)   Sudafed  [Pseudoephedrine Hcl] Other (See Comments)    Increased joint pain    HOME MEDICATIONS: Outpatient Medications Prior to Visit  Medication Sig Dispense Refill   acetaminophen (TYLENOL) 500 MG tablet Take 1,000 mg by mouth every 6 (six) hours as needed (for pain).      albuterol (VENTOLIN HFA) 108 (90 Base) MCG/ACT inhaler TAKE 2 PUFFS BY MOUTH EVERY 6 HOURS AS NEEDED FOR WHEEZE OR SHORTNESS OF BREATH 18 each 2   alendronate (FOSAMAX) 70 MG tablet TAKE 1 TABLET BY MOUTH EVERY 7 DAYS. TAKE WITH A FULL GLASS OF WATER ON AN EMPTY STOMACH. 12 tablet 1   amLODipine (NORVASC) 5 MG tablet TAKE 1 TABLET BY MOUTH EVERY DAY 90 tablet 0   cetirizine (ZYRTEC) 10 MG tablet TAKE 1 TABLET BY MOUTH EVERY DAY 90 tablet 1   fluticasone (FLONASE) 50 MCG/ACT nasal spray SPRAY 2 SPRAYS INTO EACH NOSTRIL EVERY DAY 48 mL 1   folic acid (FOLVITE) 1 MG tablet TAKE 1 TABLET BY MOUTH EVERY DAY 90 tablet 3   hydrochlorothiazide (MICROZIDE) 12.5 MG capsule TAKE 1 CAPSULE BY MOUTH EVERY DAY 90 capsule 1   methotrexate (RHEUMATREX) 2.5 MG tablet TAKE 3 TABLETS BY MOUTH EACH WEEK ON THE SAME DAY. 36 tablet 0   montelukast (SINGULAIR) 10 MG tablet TAKE 1 TABLET BY MOUTH EVERYDAY AT BEDTIME 90 tablet 1   potassium chloride SA (KLOR-CON M20) 20 MEQ tablet Take 1 tablet (20 mEq total) by mouth 2 (two) times daily. 180 tablet 1   predniSONE (DELTASONE) 1 MG tablet TAKE 4 TABLETS (4 MG  TOTAL) BY MOUTH DAILY WITH BREAKFAST 360 tablet 0   promethazine-dextromethorphan (PROMETHAZINE-DM) 6.25-15 MG/5ML syrup Take 5 mLs by mouth 4 (four) times daily as needed for cough. 118 mL 0   Propylene Glycol (SYSTANE BALANCE OP) Place 1 drop into both eyes daily as needed (for dry eyes).      butalbital-acetaminophen-caffeine (FIORICET) 50-325-40 MG tablet Take one tablet by mouth once daily, as needed, for severe headache 14 tablet 0   gabapentin (NEURONTIN) 100 MG capsule TAKE 1 CAPSULE BY MOUTH AT BEDTIME. 30 capsule 1   rosuvastatin (CRESTOR)  10 MG tablet Take 10 mg by mouth daily.     acyclovir (ZOVIRAX) 400 MG tablet TAKE 1 TABLET BY MOUTH TWICE A DAY 180 tablet 1   rosuvastatin (CRESTOR) 5 MG tablet TAKE 1 TABLET BY MOUTH EVERY OTHER DAY (Patient not taking: Reported on 09/14/2022) 45 tablet 0   No facility-administered medications prior to visit.    PAST MEDICAL HISTORY: Past Medical History:  Diagnosis Date   Acid reflux    Acute cystitis with hematuria 04/11/2011   Anemia    ARF (acute renal failure) (HCC) 12/14/2010   Arthritis    CKD (chronic kidney disease), stage II    previously seen by nephrologist--- dr Lowell Guitar,  pt released per lov in epic 09-24-2017   Diabetes mellitus, type II (HCC)    Diverticulosis of colon    Essential hypertension, benign    followed by pcp   (pt had nuclear stress test (in epic under media) dated 05-25-2008 showed normal perfusion w/ no evidence ischemia, ef 83%   Heart disease    History of bradycardia    previously seen by cardiologist, dr branch, pt released per lov 08-11-2018 in epic, bradycardia resolved   History of sepsis    04-03-2019 hospital admission w/ urosepsis due to obstructive uropathy due to right ureter stone   Hypokalemia    Lipoma of colon    ascending   Lumbar spondylosis    Mixed hyperlipidemia    Nephrolithiasis    per CT 04-03-2019 nonobstructive bilateral stones   Ocular herpes    Osteoarthritis    followed by dr Corliss Skains--- hands, shoulders, feet   Osteoporosis    Rheumatoid arthritis(714.0)    rheumotologist-- dr s. Corliss Skains---  multiple sites, treated with methotrexate/ prednisone   Right nephrolithiasis-1.4cm Moderate right-sided urinary tract obstruction/hydronephrosis 04/03/2019   Right ureteral calculus    Seasonal allergies    Sepsis (HCC) 04/03/2019   Sepsis secondary to UTI /Nephrolithiasis with obstructive uropathy 04/03/2019   SUI (stress urinary incontinence, female)    Uterine fibroid     PAST SURGICAL HISTORY: Past Surgical History:   Procedure Laterality Date   BUNIONECTOMY Right 2012   CATARACT EXTRACTION W/ INTRAOCULAR LENS  IMPLANT, BILATERAL  2009   CHOLECYSTECTOMY  12/17/2010   Procedure: LAPAROSCOPIC CHOLECYSTECTOMY;  Surgeon: Fabio Bering;  Location: [facility];  Service: General;  Laterality: N/A;   COLONOSCOPY  05/22/2011   Procedure: COLONOSCOPY;  Surgeon: Malissa Hippo, MD;  Location: [facility];  Service: Endoscopy;  Laterality: N/A;  1045   CYSTOSCOPY WITH RETROGRADE PYELOGRAM, URETEROSCOPY AND STENT PLACEMENT Right 05/09/2019   Procedure: CYSTOSCOPY WITH RETROGRADE PYELOGRAM, URETEROSCOPY AND STENT PLACEMENT;  Surgeon: Noel Christmas, MD;  Location: [facility];  Service: Urology;  Laterality: Right;  1 HR   CYSTOSCOPY WITH STENT PLACEMENT Right 04/03/2019   Procedure: CYSTOSCOPY, RIGHT RETROGRADE PYELOGRAM  WITH STENT PLACEMENT;  Surgeon: Noel Christmas, MD;  Location: [facility];  Service: Urology;  Laterality: Right;   EXCISION BONE CYST Right 2010   RIGHT SHOULDER   GANGLION CYST EXCISION Left 06/02/2012   Procedure: Excision ganglion left dorsal wrist;  Surgeon: Darreld Mclean, MD;  Location: [facility];  Service: Orthopedics;  Laterality: Left;   HOLMIUM LASER APPLICATION Right 05/09/2019   Procedure: HOLMIUM LASER APPLICATION;  Surgeon: Noel Christmas, MD;  Location: [facility];  Service: Urology;  Laterality: Right;   kidney stones     LUMBAR MICRODISCECTOMY  07-16-2000   @[facility]    L 2  -3   POSTERIOR LAMINECTOMY / DECOMPRESSION LUMBAR SPINE  06-26-2009   @[facility]    w/ LATERAL MASS FUSION , L3 -- 5   SHOULDER ARTHROSCOPY WITH DISTAL CLAVICLE RESECTION Right 01-10-2009   @[facility]    w/ DEBRIDEMENT AND EXCISION RECURRENT GANGLION CYST    FAMILY HISTORY: Family History  Problem Relation Age of Onset   Stroke Mother    Cancer Father    Diabetes Sister    Diabetes Sister    Kidney failure Brother    Kidney failure Brother    Pancreatic cancer Sister    Kidney Stones Sister         Only one Kidney   Arthritis Sister    Heart disease Daughter     SOCIAL HISTORY: Social History   Socioeconomic History   Marital status: Widowed    Spouse name: Not on file   Number of children: 1   Years of education: Not on file   Highest education level: Not on file  Occupational History   Occupation: Retired    Comment: [employer]  Tobacco Use   Smoking status: Never    Passive exposure: Past   Smokeless tobacco: Never  Vaping Use   Vaping status: Never Used  Substance and Sexual Activity   Alcohol use: No   Drug use: Never   Sexual activity: Not Currently    Birth control/protection: Post-menopausal  Other Topics Concern   Not on file  Social History Narrative   Husband passed away 04/28/2016. It has been financially harder to pay the bills since his passing, but she is making it. Has grandchildren around which makes it easier.    Social Determinants of Health   Financial Resource Strain: Low Risk  (08/05/2022)   Overall Financial Resource Strain (CARDIA)    Difficulty of Paying Living Expenses: Not hard at all  Food Insecurity: No Food Insecurity (08/05/2022)   Hunger Vital Sign    Worried About Running Out of Food in the Last Year: Never true    Ran Out of Food in the Last Year: Never true  Transportation Needs: No Transportation Needs (08/05/2022)   PRAPARE - Administrator, Civil Service (Medical): No    Lack of Transportation (Non-Medical): No  Physical Activity: Insufficiently Active (08/05/2022)   Exercise Vital Sign    Days of Exercise per Week: 3 days    Minutes of Exercise per Session: 10 min  Stress: No Stress Concern Present (08/05/2022)   [employer] of Occupational Health - Occupational Stress Questionnaire    Feeling of Stress : Not at all  Social Connections: Socially Integrated (08/05/2022)   Social Connection and Isolation Panel [NHANES]    Frequency of Communication with Friends and Family: More than three times a week     Frequency of Social Gatherings with Friends and Family: More than three times a week    Attends Religious Services: More  than 4 times per year    Active Member of Clubs or Organizations: Yes    Attends Banker Meetings: More than 4 times per year    Marital Status: Married  Catering manager Violence: Not At Risk (08/05/2022)   Humiliation, Afraid, Rape, and Kick questionnaire    Fear of Current or Ex-Partner: No    Emotionally Abused: No    Physically Abused: No    Sexually Abused: No    PHYSICAL EXAM  GENERAL EXAM/CONSTITUTIONAL: Vitals:  Vitals:   09/14/22 1455 09/14/22 1458  BP: (!) 174/71 (!) 177/69  Pulse: (!) 53   Weight: 140 lb 8 oz (63.7 kg)   Height: 4\' 11"  (1.499 m)    Body mass index is 28.38 kg/m. Wt Readings from Last 3 Encounters:  09/14/22 140 lb 8 oz (63.7 kg)  09/09/22 143 lb 9.6 oz (65.1 kg)  08/05/22 137 lb (62.1 kg)   Patient is in no distress; well developed, nourished and groomed; neck is supple  MUSCULOSKELETAL: Gait, strength, tone, movements noted in Neurologic exam below  NEUROLOGIC: MENTAL STATUS:     07/17/2020    2:08 PM 05/01/2020   10:44 AM  MMSE - Mini Mental State Exam  Not completed: Unable to complete   Orientation to time  5  Orientation to Place  5  Registration  3  Attention/ Calculation  5  Recall  2  Language- name 2 objects  2  Language- repeat  1  Language- follow 3 step command  3  Language- read & follow direction  1  Write a sentence  1  Copy design  1  Total score  29   awake, alert, oriented to person, place and time recent and remote memory intact normal attention and concentration language fluent, comprehension intact, naming intact fund of knowledge appropriate  CRANIAL NERVE:  2nd, 3rd, 4th, 6th - Visual fields full to confrontation, extraocular muscles intact, no nystagmus 5th - facial sensation symmetric 7th - facial strength symmetric 8th - hearing intact 9th - palate elevates  symmetrically, uvula midline 11th - shoulder shrug symmetric 12th - tongue protrusion midline  MOTOR:  normal bulk and tone, full strength in the BUE, BLE  SENSORY:  normal and symmetric to light touch  COORDINATION:  finger-nose-finger, fine finger movements normal  REFLEXES:  deep tendon reflexes present and symmetric  GAIT/STATION:  Ambulates with a cane     DIAGNOSTIC DATA (LABS, IMAGING, TESTING) - I reviewed patient records, labs, notes, testing and imaging myself where available.  Lab Results  Component Value Date   WBC 7.0 04/08/2022   HGB 12.2 04/08/2022   HCT 36.5 04/08/2022   MCV 93.1 04/08/2022   PLT 258 04/08/2022      Component Value Date/Time   NA 144 09/01/2022 1113   K 3.1 (L) 09/01/2022 1113   CL 106 09/01/2022 1113   CO2 26 09/01/2022 1113   GLUCOSE 90 09/01/2022 1113   GLUCOSE 130 (H) 11/06/2021 1339   BUN 15 09/01/2022 1113   CREATININE 0.93 09/01/2022 1113   CREATININE 0.96 (H) 11/06/2021 1339   CALCIUM 10.3 09/01/2022 1113   PROT 6.4 09/01/2022 1113   ALBUMIN 4.2 09/01/2022 1113   AST 15 09/01/2022 1113   ALT 9 09/01/2022 1113   ALKPHOS 55 09/01/2022 1113   BILITOT 0.6 09/01/2022 1113   GFRNONAA 60 03/12/2020 1102   GFRAA 70 03/12/2020 1102   Lab Results  Component Value Date   CHOL 139 09/01/2022  HDL 71 09/01/2022   LDLCALC 51 09/01/2022   TRIG 88 09/01/2022   CHOLHDL 2.0 09/01/2022   Lab Results  Component Value Date   HGBA1C 6.7 (H) 06/22/2022   Lab Results  Component Value Date   VITAMINB12 318 12/15/2010   Lab Results  Component Value Date   TSH 1.390 03/13/2021    MRI Brain 02/18/2022 1.  No evidence of an acute intracranial abnormality. 2. Chronic cortical/subcortical infarct within the mid-to-anterior left frontal lobe, new from the prior brain MRI of 05/18/2012. 3. Background mild-to-moderate chronic small vessel ischemic changes within the cerebral white matter, mildly progressed from the prior MRI. 4.  Multiple small chronic infarcts within the bilateral cerebellar hemispheres, increased in number from the prior MRI.    ASSESSMENT AND PLAN  89 y.o. year old female with multiple medical conditions including hypertension, hyperlipidemia, heart disease and kidney disease who is presenting for follow up for her chronic headache.  She is doing well on low-dose gabapentin, reports in the past couple months she did have 1 headache day.  Plan for now is to continue gabapentin, if her headaches continue to improve, I have advised daughter to use gabapentin every other day to see how she is doing.  They voiced understanding.  Advised them to continue following up with the PCP and return as needed.   1. Chronic tension-type headache, not intractable     Patient Instructions  Continue with gabapentin nightly, can consider decreasing the gabapentin to every other day as your headaches improve Decrease Crestor back to 5 mg nightly as your last LDL was 51 Continue your other medications Continue to follow with PCP and return as needed.  No orders of the defined types were placed in this encounter.   Meds ordered this encounter  Medications   gabapentin (NEURONTIN) 100 MG capsule    Sig: Take 1 capsule (100 mg total) by mouth at bedtime.    Dispense:  90 capsule    Refill:  3   rosuvastatin (CRESTOR) 5 MG tablet    Sig: Take 1 tablet (5 mg total) by mouth daily.    Dispense:  90 tablet    Refill:  1    Return if symptoms worsen or fail to improve.    Windell Norfolk, MD 09/14/2022, 6:42 PM  [facility] [address] [place], [place] [address] [phone]

## 2022-09-15 ENCOUNTER — Ambulatory Visit (HOSPITAL_COMMUNITY)
Admission: RE | Admit: 2022-09-15 | Discharge: 2022-09-15 | Disposition: A | Discharge status: Home / Self Care | Payer: Medicare PPO | Source: Ambulatory Visit | Attending: Family Medicine | Admitting: Family Medicine

## 2022-09-15 DIAGNOSIS — R928 Other abnormal and inconclusive findings on diagnostic imaging of breast: Secondary | ICD-10-CM | POA: Diagnosis not present

## 2022-09-15 DIAGNOSIS — R92322 Mammographic fibroglandular density, left breast: Secondary | ICD-10-CM | POA: Diagnosis not present

## 2022-09-21 ENCOUNTER — Ambulatory Visit: Payer: Medicare PPO | Attending: Physician Assistant | Admitting: Physician Assistant

## 2022-09-21 ENCOUNTER — Encounter: Payer: Self-pay | Admitting: Physician Assistant

## 2022-09-21 VITALS — BP 123/65 | HR 49 | Resp 18 | Ht 59.5 in | Wt 140.0 lb

## 2022-09-21 DIAGNOSIS — M170 Bilateral primary osteoarthritis of knee: Secondary | ICD-10-CM

## 2022-09-21 DIAGNOSIS — M19042 Primary osteoarthritis, left hand: Secondary | ICD-10-CM

## 2022-09-21 DIAGNOSIS — M19011 Primary osteoarthritis, right shoulder: Secondary | ICD-10-CM

## 2022-09-21 DIAGNOSIS — R011 Cardiac murmur, unspecified: Secondary | ICD-10-CM

## 2022-09-21 DIAGNOSIS — M19012 Primary osteoarthritis, left shoulder: Secondary | ICD-10-CM

## 2022-09-21 DIAGNOSIS — M810 Age-related osteoporosis without current pathological fracture: Secondary | ICD-10-CM | POA: Diagnosis not present

## 2022-09-21 DIAGNOSIS — M0579 Rheumatoid arthritis with rheumatoid factor of multiple sites without organ or systems involvement: Secondary | ICD-10-CM | POA: Diagnosis not present

## 2022-09-21 DIAGNOSIS — Z79899 Other long term (current) drug therapy: Secondary | ICD-10-CM

## 2022-09-21 DIAGNOSIS — M19041 Primary osteoarthritis, right hand: Secondary | ICD-10-CM

## 2022-09-21 DIAGNOSIS — E119 Type 2 diabetes mellitus without complications: Secondary | ICD-10-CM | POA: Diagnosis not present

## 2022-09-21 DIAGNOSIS — M19072 Primary osteoarthritis, left ankle and foot: Secondary | ICD-10-CM

## 2022-09-21 DIAGNOSIS — M19071 Primary osteoarthritis, right ankle and foot: Secondary | ICD-10-CM | POA: Diagnosis not present

## 2022-09-21 DIAGNOSIS — I10 Essential (primary) hypertension: Secondary | ICD-10-CM

## 2022-09-21 DIAGNOSIS — M47816 Spondylosis without myelopathy or radiculopathy, lumbar region: Secondary | ICD-10-CM | POA: Diagnosis not present

## 2022-09-21 LAB — CBC WITH DIFFERENTIAL/PLATELET
Absolute Monocytes: 252 {cells}/uL (ref 200–950)
Basophils Absolute: 58 {cells}/uL (ref 0–200)
Basophils Relative: 0.8 %
Eosinophils Absolute: 79 {cells}/uL (ref 15–500)
Eosinophils Relative: 1.1 %
HCT: 34.2 % — ABNORMAL LOW (ref 35.0–45.0)
Hemoglobin: 11.6 g/dL — ABNORMAL LOW (ref 11.7–15.5)
Lymphs Abs: 1246 {cells}/uL (ref 850–3900)
MCH: 30.7 pg (ref 27.0–33.0)
MCHC: 33.9 g/dL (ref 32.0–36.0)
MCV: 90.5 fL (ref 80.0–100.0)
MPV: 9.3 fL (ref 7.5–12.5)
Monocytes Relative: 3.5 %
Neutro Abs: 5566 {cells}/uL (ref 1500–7800)
Neutrophils Relative %: 77.3 %
Platelets: 248 10*3/uL (ref 140–400)
RBC: 3.78 10*6/uL — ABNORMAL LOW (ref 3.80–5.10)
RDW: 13.9 % (ref 11.0–15.0)
Total Lymphocyte: 17.3 %
WBC: 7.2 10*3/uL (ref 3.8–10.8)

## 2022-09-21 NOTE — Patient Instructions (Addendum)
If you have signs or symptoms of an infection or start antibiotics: First, call your PCP for workup of your infection. Hold your medication through the infection, until you complete your antibiotics, and until symptoms resolve if you take the following: Injectable medication (Actemra, Benlysta, Cimzia, Cosentyx, Enbrel, Humira, Kevzara, Orencia, Remicade, Simponi, Stelara, Taltz, Tremfya) Methotrexate Leflunomide (Arava) Mycophenolate (Cellcept) Harriette Ohara, Olumiant, or Rinvoq  Standing Labs We placed an order today for your standing lab work.   Please have your standing labs drawn in end-November and every 3 months   Please have your labs drawn 2 weeks prior to your appointment so that the provider can discuss your lab results at your appointment, if possible.  Please note that you may see your imaging and lab results in MyChart before we have reviewed them. We will contact you once all results are reviewed. Please allow our office up to 72 hours to thoroughly review all of the results before contacting the office for clarification of your results.  WALK-IN LAB HOURS  Monday through Thursday from 8:00 am -12:30 pm and 1:00 pm-5:00 pm and Friday from 8:00 am-12:00 pm.  Patients with office visits requiring labs will be seen before walk-in labs.  You may encounter longer than normal wait times. Please allow additional time. Wait times may be shorter on  Monday and Thursday afternoons.  We do not book appointments for walk-in labs. We appreciate your patience and understanding with our staff.   Labs are drawn by [facility]. Please bring your co-pay at the time of your lab draw.  You may receive a bill from [facility] for your lab work.  Please note if you are on Hydroxychloroquine and and an order has been placed for a Hydroxychloroquine level,  you will need to have it drawn 4 hours or more after your last dose.  If you wish to have your labs drawn at another location, please call the office 24  hours in advance so we can fax the orders.  The office is located at [address], [address], [place], [place] [address]   If you have any questions regarding directions or hours of operation,  please call [phone].   As a reminder, please drink plenty of water prior to coming for your lab work. Thanks!

## 2022-09-22 NOTE — Progress Notes (Signed)
RBC count, hemoglobin, and hematocrit are low. Rest of CBC WNL.   Please clarify if she has had any blood in her stool? Any sources of bleeding? Consider workup with PCP for anemia.

## 2022-09-25 ENCOUNTER — Ambulatory Visit: Payer: Medicare PPO | Admitting: Orthopedic Surgery

## 2022-10-21 ENCOUNTER — Other Ambulatory Visit: Payer: Self-pay | Admitting: Physician Assistant

## 2022-10-21 NOTE — Telephone Encounter (Signed)
Last Fill: 06/29/2022  Next Visit: 03/03/2023  Last Visit: 09/21/2022  Dx: Rheumatoid arthritis with rheumatoid factor of multiple sites without organ or systems involvement   Current Dose per office note on 09/21/2022: prednisone 1 mg 4 tablets daily   Okay to refill Prednisone?

## 2022-11-08 ENCOUNTER — Other Ambulatory Visit: Payer: Self-pay | Admitting: Family Medicine

## 2022-11-08 ENCOUNTER — Other Ambulatory Visit: Payer: Self-pay | Admitting: Physician Assistant

## 2022-11-08 DIAGNOSIS — M0579 Rheumatoid arthritis with rheumatoid factor of multiple sites without organ or systems involvement: Secondary | ICD-10-CM

## 2022-11-08 DIAGNOSIS — E785 Hyperlipidemia, unspecified: Secondary | ICD-10-CM

## 2022-11-08 DIAGNOSIS — E559 Vitamin D deficiency, unspecified: Secondary | ICD-10-CM

## 2022-11-08 DIAGNOSIS — E1169 Type 2 diabetes mellitus with other specified complication: Secondary | ICD-10-CM

## 2022-11-08 DIAGNOSIS — I10 Essential (primary) hypertension: Secondary | ICD-10-CM

## 2022-11-08 DIAGNOSIS — J301 Allergic rhinitis due to pollen: Secondary | ICD-10-CM

## 2022-11-08 DIAGNOSIS — R7989 Other specified abnormal findings of blood chemistry: Secondary | ICD-10-CM

## 2022-11-09 MED ORDER — METHOTREXATE SODIUM 2.5 MG PO TABS: ORAL_TABLET | ORAL | 0 refills | Status: DC

## 2022-11-09 NOTE — Telephone Encounter (Signed)
Last Fill: 06/29/2022  Labs: 09/01/2022 GFR 59, Potassium 3.1 09/21/2022 RBC count, hemoglobin, and hematocrit are low. Rest of CBC WNL.     Next Visit: 03/03/2023  Last Visit: 09/21/2022  DX:  Rheumatoid arthritis with rheumatoid factor of multiple sites without organ or systems involvement   Current Dose per office note 09/21/2022: Methotrexate 3 tablets by mouth every 7 days   Okay to refill Methotrexate?

## 2022-11-13 ENCOUNTER — Other Ambulatory Visit: Payer: Self-pay | Admitting: Physician Assistant

## 2022-11-18 ENCOUNTER — Ambulatory Visit: Payer: Medicare PPO | Admitting: Family Medicine

## 2022-12-07 ENCOUNTER — Ambulatory Visit (INDEPENDENT_AMBULATORY_CARE_PROVIDER_SITE_OTHER): Payer: Medicare PPO | Admitting: Family Medicine

## 2022-12-07 ENCOUNTER — Encounter: Payer: Self-pay | Admitting: Family Medicine

## 2022-12-07 VITALS — BP 154/77 | HR 50 | Ht 59.5 in | Wt 130.1 lb

## 2022-12-07 DIAGNOSIS — W19XXXA Unspecified fall, initial encounter: Secondary | ICD-10-CM | POA: Insufficient documentation

## 2022-12-07 DIAGNOSIS — T83511A Infection and inflammatory reaction due to indwelling urethral catheter, initial encounter: Secondary | ICD-10-CM | POA: Diagnosis not present

## 2022-12-07 DIAGNOSIS — N390 Urinary tract infection, site not specified: Secondary | ICD-10-CM | POA: Diagnosis not present

## 2022-12-07 DIAGNOSIS — M25551 Pain in right hip: Secondary | ICD-10-CM | POA: Diagnosis not present

## 2022-12-07 NOTE — Patient Instructions (Signed)

## 2022-12-07 NOTE — Assessment & Plan Note (Signed)
Right hip Xray ordered We reviewed non-pharmacological interventions, including the importance of rest, avoiding twisting, improper bending, and straining the lower back. I demonstrated proper body mechanics to prevent further injury and advised alternating between ice and heat therapy for relief. Stretching exercises for both the back and legs were recommended to improve flexibility and support recovery. The patient was advised to follow up if symptoms worsen or persist. The patient expressed understanding of the treatment plan, and all questions were thoroughly addressed.  Can take tylenol PRN for pain

## 2022-12-07 NOTE — Assessment & Plan Note (Addendum)
Urinalysis, NuSwab and urine culture ordered- Awaiting results will follow up.  Discussed managing a UTI at home, it's important to drink plenty of water to help flush out bacteria from your urinary tract. Make sure to urinate frequently and avoid holding your urine. After using the bathroom, always wipe from front to back to prevent bacteria from spreading. Avoid irritants like caffeine, alcohol, spicy foods, and artificial sweeteners, as they can aggravate your bladder. Wearing loose, breathable clothing, especially cotton underwear, can help keep the area dry and reduce bacterial growth. If symptoms persist or worsen follow up.

## 2022-12-07 NOTE — Progress Notes (Signed)
Established Patient Office Visit   Subjective  Patient ID: Stacy Meyers, female    DOB: 08-21-1932  Age: 89 y.o. MRN: [id number]  Chief Complaint  Patient presents with   Acute Visit    Recent Fall on 11/18  Possible UTI- frequent urgency.     She  has a past medical history of Acid reflux, Acute cystitis with hematuria (04/11/2011), Anemia, ARF (acute renal failure) (HCC) (12/14/2010), Arthritis, CKD (chronic kidney disease), stage II, Diabetes mellitus, type II (HCC), Diverticulosis of colon, Essential hypertension, benign, Heart disease, History of bradycardia, History of sepsis, Hypokalemia, Lipoma of colon, Lumbar spondylosis, Mixed hyperlipidemia, Nephrolithiasis, Ocular herpes, Osteoarthritis, Osteoporosis, Rheumatoid arthritis(714.0), Right nephrolithiasis-1.4cm Moderate right-sided urinary tract obstruction/hydronephrosis (04/03/2019), Right ureteral calculus, Seasonal allergies, Sepsis (HCC) (04/03/2019), Sepsis secondary to UTI /Nephrolithiasis with obstructive uropathy (04/03/2019), SUI (stress urinary incontinence, female), and Uterine fibroid.  Fall Incident: The patient reports a fall that occurred on 11/23/2022 in her bedroom while walking. She fell from an unknown height and landed on a hard floor. The point of impact was her right hip, which is now the source of pain rated at 7/10 severity. The pain is aggravated by standing and applying pressure to the injured area. The patient denies associated symptoms such as fever, headaches, hematuria, loss of consciousness, nausea, or numbness. She has been managing the pain with acetaminophen and rest, which have provided significant relief.  Urinary Tract Infection Symptoms: The patient also reports urinary symptoms occurring with every urination, including flank pain, frequency, and urgency. She notes a lack of urine output and denies being sexually active. There is no history of pyelonephritis or kidney stones. Pertinent negatives include  no chills, discharge, hematuria, or nausea. She has tried increasing fluid intake, which has provided only mild relief.    Review of Systems  Eyes:  Negative for blurred vision.  Respiratory:  Negative for shortness of breath.   Cardiovascular:  Negative for chest pain.  Genitourinary:  Positive for flank pain, frequency and urgency. Negative for dysuria and hematuria.  Musculoskeletal:  Positive for falls.  Neurological:  Negative for dizziness.      Objective:     BP (!) 154/77   Pulse (!) 50   Ht 4' 11.5" (1.511 m)   Wt 130 lb 1.3 oz (59 kg)   SpO2 94%   BMI 25.83 kg/m  BP Readings from Last 3 Encounters:  12/07/22 (!) 154/77  09/21/22 123/65  09/14/22 (!) 177/69      Physical Exam Vitals reviewed.  Constitutional:      General: She is not in acute distress.    Appearance: Normal appearance. She is not ill-appearing, toxic-appearing or diaphoretic.  HENT:     Head: Normocephalic.  Eyes:     General:        Right eye: No discharge.        Left eye: No discharge.     Conjunctiva/sclera: Conjunctivae normal.  Cardiovascular:     Rate and Rhythm: Normal rate.     Pulses: Normal pulses.     Heart sounds: Normal heart sounds.  Pulmonary:     Effort: Pulmonary effort is normal. No respiratory distress.     Breath sounds: Normal breath sounds.  Abdominal:     General: Bowel sounds are normal.     Palpations: Abdomen is soft.     Tenderness: There is no abdominal tenderness. There is no right CVA tenderness, left CVA tenderness or guarding.  Skin:    General: Skin is  warm and dry.     Capillary Refill: Capillary refill takes less than 2 seconds.  Neurological:     Mental Status: She is alert.     Coordination: Coordination normal.  Psychiatric:        Mood and Affect: Mood normal.        Behavior: Behavior normal.        Thought Content: Thought content normal.      No results found for any visits on 12/07/22.  The ASCVD Risk score (Arnett DK, et al.,  2019) failed to calculate for the following reasons:   The 2019 ASCVD risk score is only valid for ages 75 to 27   The patient has a prior MI or stroke diagnosis    Assessment & Plan:  Urinary tract infection associated with catheterization of urinary tract, unspecified indwelling urinary catheter type, initial encounter [facility]) Assessment & Plan: Urinalysis, NuSwab and urine culture ordered- Awaiting results will follow up.  Discussed managing a UTI at home, it's important to drink plenty of water to help flush out bacteria from your urinary tract. Make sure to urinate frequently and avoid holding your urine. After using the bathroom, always wipe from front to back to prevent bacteria from spreading. Avoid irritants like caffeine, alcohol, spicy foods, and artificial sweeteners, as they can aggravate your bladder. Wearing loose, breathable clothing, especially cotton underwear, can help keep the area dry and reduce bacterial growth. If symptoms persist or worsen follow up.   Orders: -     Urinalysis -     Urine Culture  Right hip pain -     DG HIP UNILAT W OR W/O PELVIS 2-3 VIEWS RIGHT; Future  Fall, initial encounter Assessment & Plan: Right hip Xray ordered We reviewed non-pharmacological interventions, including the importance of rest, avoiding twisting, improper bending, and straining the lower back. I demonstrated proper body mechanics to prevent further injury and advised alternating between ice and heat therapy for relief. Stretching exercises for both the back and legs were recommended to improve flexibility and support recovery. The patient was advised to follow up if symptoms worsen or persist. The patient expressed understanding of the treatment plan, and all questions were thoroughly addressed.  Can take tylenol PRN for pain     Return if symptoms worsen or fail to improve.   Cruzita Lederer Newman Nip, FNP

## 2022-12-08 LAB — URINALYSIS
Bilirubin, UA: NEGATIVE
Glucose, UA: NEGATIVE
Ketones, UA: NEGATIVE
Nitrite, UA: NEGATIVE
Protein,UA: NEGATIVE
Specific Gravity, UA: 1.025 (ref 1.005–1.030)
Urobilinogen, Ur: 0.2 mg/dL (ref 0.2–1.0)
pH, UA: 5.5 (ref 5.0–7.5)

## 2022-12-09 ENCOUNTER — Telehealth: Payer: Self-pay

## 2022-12-09 NOTE — Telephone Encounter (Signed)
Patient daughter calling about urine results please advise.

## 2022-12-09 NOTE — Telephone Encounter (Signed)
Urinalysis shows signs of dehydration, which means your body needs more fluids. I recommend increasing your water intake throughout the day to stay hydrated. We are still waiting on the results of your urine culture and I will update you as soon as they are available

## 2022-12-10 ENCOUNTER — Other Ambulatory Visit: Payer: Self-pay | Admitting: Family Medicine

## 2022-12-10 LAB — URINE CULTURE

## 2022-12-10 MED ORDER — AMOXICILLIN-POT CLAVULANATE 875-125 MG PO TABS: 1.0000 | ORAL_TABLET | Freq: Two times a day (BID) | ORAL | 0 refills | Status: AC

## 2022-12-10 NOTE — Progress Notes (Signed)
Please inform patient,  The urine culture results are positive for infection The treatment plan includes initiating Augmentin to be taken twice daily for 7 days. Medication sent pharmacy     Advise to drink plenty of water to help flush out bacteria from your urinary tract. Make sure to urinate frequently and avoid holding your urine. After using the bathroom, always wipe from front to back to prevent bacteria from spreading. Avoid irritants like caffeine, alcohol, spicy foods, and artificial sweeteners, as they can aggravate your bladder. Wearing loose, breathable clothing, especially cotton underwear, can help keep the area dry and reduce bacterial growth. If symptoms persist or worsen follow up.

## 2022-12-11 ENCOUNTER — Ambulatory Visit
Admission: EM | Admit: 2022-12-11 | Discharge: 2022-12-11 | Discharge status: Against Medical Advice | Payer: Medicare PPO

## 2022-12-11 ENCOUNTER — Telehealth: Payer: Self-pay | Admitting: Family Medicine

## 2022-12-11 NOTE — Telephone Encounter (Signed)
Received lab results and want to know how to proceed. Please advise

## 2022-12-11 NOTE — ED Notes (Addendum)
Brought pt for triage, VSS. (BP 156/64, HR 66, Sp02 95%, Temp 97.9, Resp 20. pt family reports was seen at PCP for same and has pending urine culture. Reviewed chart and looks like prescription has been already been sent in. Pt and pt family reported, "we were getting run around at pcp office but if all of its been taken care of that's fine, we can leave." Consulted NP and reported if pt has already been seen by PCP and pt is asking to leave to avoid duplicate services that is fine.   Pt family also inquiring about refund of copay. Discussed with front desk, reported that would be possible.   Pt left UC prior to triage being completed. NAD noted. Pt alert, airway patent. steady gait noted with cane that uses at baseline.

## 2022-12-11 NOTE — Telephone Encounter (Signed)
Copied from CRM [id number]. Topic: Clinical - Lab/Test Results >> Dec 10, 2022  3:45 PM Colletta Maryland S wrote: Reason for CRM: pts granddaughter Tera Partridge B. Derrell Lolling) called in stating they have received pts lab results and would like to know how to proceed

## 2022-12-11 NOTE — Telephone Encounter (Signed)
Patient granddaughter Stacy Meyers asked for a call back from the office manager. She is very unhappy with the situation the way it was handle with Stacy Meyers visit for a UTI on 12.02.2024.  She has drove from [place] to bring her grandmother to urgent care today in [place] and got a message a rx was sent into her pharmacy today before getting here for her urgent care appointment. She has called but no one has contacted her about the results. Granddaughter call back # [phone].

## 2022-12-14 ENCOUNTER — Encounter: Payer: Self-pay | Admitting: Family Medicine

## 2022-12-14 NOTE — Telephone Encounter (Signed)
Patient daughter aware. °

## 2022-12-15 NOTE — Telephone Encounter (Signed)
I spoke with granddaughter Ms Stacy Meyers yesterday and subsequently sent a patient message to  her grandmother, Stacy Meyers

## 2023-01-18 ENCOUNTER — Other Ambulatory Visit: Payer: Self-pay | Admitting: Physician Assistant

## 2023-01-18 ENCOUNTER — Other Ambulatory Visit: Payer: Self-pay | Admitting: Family Medicine

## 2023-01-18 ENCOUNTER — Other Ambulatory Visit: Payer: Self-pay | Admitting: Neurology

## 2023-01-18 ENCOUNTER — Other Ambulatory Visit: Payer: Self-pay | Admitting: "Endocrinology

## 2023-01-18 DIAGNOSIS — M0579 Rheumatoid arthritis with rheumatoid factor of multiple sites without organ or systems involvement: Secondary | ICD-10-CM

## 2023-01-19 MED ORDER — PREDNISONE 1 MG PO TABS: ORAL_TABLET | ORAL | 0 refills | Status: DC

## 2023-01-19 NOTE — Telephone Encounter (Signed)
 Last Fill: 10/21/2022   Next Visit: 03/03/2023   Last Visit: 09/21/2022   Dx: Rheumatoid arthritis with rheumatoid factor of multiple sites without organ or systems involvement    Current Dose per office note on 09/21/2022: prednisone  1 mg 4 tablets daily    Okay to refill Prednisone ?

## 2023-02-04 ENCOUNTER — Encounter: Payer: Self-pay | Admitting: Urology

## 2023-02-04 ENCOUNTER — Ambulatory Visit: Payer: Medicare PPO | Admitting: Urology

## 2023-02-04 VITALS — BP 156/61 | HR 52

## 2023-02-04 DIAGNOSIS — N3946 Mixed incontinence: Secondary | ICD-10-CM

## 2023-02-04 DIAGNOSIS — Z87442 Personal history of urinary calculi: Secondary | ICD-10-CM | POA: Diagnosis not present

## 2023-02-04 DIAGNOSIS — Z87440 Personal history of urinary (tract) infections: Secondary | ICD-10-CM | POA: Diagnosis not present

## 2023-02-04 LAB — MICROSCOPIC EXAMINATION: Bacteria, UA: NONE SEEN

## 2023-02-04 LAB — URINALYSIS, ROUTINE W REFLEX MICROSCOPIC
Bilirubin, UA: NEGATIVE
Glucose, UA: NEGATIVE
Ketones, UA: NEGATIVE
Leukocytes,UA: NEGATIVE
Nitrite, UA: NEGATIVE
Protein,UA: NEGATIVE
Specific Gravity, UA: 1.02 (ref 1.005–1.030)
Urobilinogen, Ur: 0.2 mg/dL (ref 0.2–1.0)
pH, UA: 6 (ref 5.0–7.5)

## 2023-02-04 MED ORDER — GEMTESA 75 MG PO TABS: 1.0000 | ORAL_TABLET | Freq: Every day | ORAL | 0 refills | Status: DC

## 2023-02-04 NOTE — Progress Notes (Signed)
Subjective: 1. History of nephrolithiasis   2. Personal history of urinary infection   3. Mixed incontinence     02/04/23: Stacy Meyers returns today in f/u for her history of nocturia with UUI, UTI's, renal stones and cysts.  She is not currently on any therapy for these conditions. She had a citrobacter UTI on 12/07/22 and was treated with augmentin.  She had negative cultures in 5/24 and 2/24. She is doing well today.  She has frequency with urgency with UUI.    02/05/22: Stacy Meyers returns today in /fu for her history of Nocturia with UUI, UTI's renal stones and renal cysts.  A renal US on 12/31/21 showed no stones and only small cysts. She has had no flank pain or hematuria.  She has urgency and frequency with rare incontinence.   01/09/21: Stacy Meyers returns today in f/u.  She had a culture on 10/24/20 that was negative. She has done well without recent UTI's and she has had no flank pain or hematuria.  She has nocturia x 4-5.  She denies daytime incontinence.   Her UA is unremarkable.  KUB and renal US prior to this visit shows stable renal stones and cysts without obstruction.   07/04/20: Stacy Meyers returns today in f/u.   Her culture on 05/02/20 was negative.   She had a renal US that showed bilateral renal stones without obstruction on 05/18/20:  She had some right back pain that was bruised.  That lasted 2-3 days but has resolved.  She has no dysuria or hematuria.  She has nocturia up to 3x.   She continues to wear a pad.   Her UA has 6-10 WBC and few bacteria.    05/02/20: Stacy Meyers is an 89 yo female who is sent by Dr. Lodema Hong for recurrent e. Coli UTI's with multiple positive cultures since 2/21 with the last on 03/14/20.   She had e. Coli sepsis in 3/21.   She had a 1.4cm right proximal stone with obstruction in 3/21 she was stented and then had ureteroscopy on 05/09/19.  She had a tethered stent and remembers pulling it out but she never followed up with Dr. Arita Miss after the procedure.  She has had no follow up  imaging.  She currently has no dysuria but she has nocturia and frequency with urgency with UUI.  She has had no hematuria. She has occasional intermittent right low back pain.  Her urine was clear on 05/01/20.  ROS:  ROS  Allergies  Allergen Reactions   Aspirin Other (See Comments)    REACTION: burns stomach (uncoated)   Sudafed [Pseudoephedrine Hcl] Other (See Comments)    Increased joint pain    Past Medical History:  Diagnosis Date   Acid reflux    Acute cystitis with hematuria 04/11/2011   Anemia    ARF (acute renal failure) (HCC) 12/14/2010   Arthritis    CKD (chronic kidney disease), stage II    previously seen by nephrologist--- dr Lowell Guitar,  pt released per lov in epic 09-24-2017   Diabetes mellitus, type II (HCC)    Diverticulosis of colon    Essential hypertension, benign    followed by pcp   (pt had nuclear stress test (in epic under media) dated 05-25-2008 showed normal perfusion w/ no evidence ischemia, ef 83%   Heart disease    History of bradycardia    previously seen by cardiologist, dr branch, pt released per lov 08-11-2018 in epic, bradycardia resolved   History of sepsis    04-03-2019  hospital admission w/ urosepsis due to obstructive uropathy due to right ureter stone   Hypokalemia    Lipoma of colon    ascending   Lumbar spondylosis    Mixed hyperlipidemia    Nephrolithiasis    per CT 04-03-2019 nonobstructive bilateral stones   Ocular herpes    Osteoarthritis    followed by dr Corliss Skains--- hands, shoulders, feet   Osteoporosis    Rheumatoid arthritis(714.0)    rheumotologist-- dr s. Corliss Skains---  multiple sites, treated with methotrexate/ prednisone   Right nephrolithiasis-1.4cm Moderate right-sided urinary tract obstruction/hydronephrosis 04/03/2019   Right ureteral calculus    Seasonal allergies    Sepsis (HCC) 04/03/2019   Sepsis secondary to UTI /Nephrolithiasis with obstructive uropathy 04/03/2019   SUI (stress urinary incontinence, female)     Uterine fibroid     Past Surgical History:  Procedure Laterality Date   BUNIONECTOMY Right 2012   CATARACT EXTRACTION W/ INTRAOCULAR LENS  IMPLANT, BILATERAL  2009   CHOLECYSTECTOMY  12/17/2010   Procedure: LAPAROSCOPIC CHOLECYSTECTOMY;  Surgeon: Fabio Bering;  Location: [facility];  Service: General;  Laterality: N/A;   COLONOSCOPY  05/22/2011   Procedure: COLONOSCOPY;  Surgeon: Malissa Hippo, MD;  Location: [facility];  Service: Endoscopy;  Laterality: N/A;  1045   CYSTOSCOPY WITH RETROGRADE PYELOGRAM, URETEROSCOPY AND STENT PLACEMENT Right 05/09/2019   Procedure: CYSTOSCOPY WITH RETROGRADE PYELOGRAM, URETEROSCOPY AND STENT PLACEMENT;  Surgeon: Noel Christmas, MD;  Location[facility];  Service: Urology;  Laterality: Right;  1 HR   CYSTOSCOPY WITH STENT PLACEMENT Right 04/03/2019   Procedure: CYSTOSCOPY, RIGHT RETROGRADE PYELOGRAM  WITH STENT PLACEMENT;  Surgeon: Noel Christmas, MD;  Location: [facility];  Service: Urology;  Laterality: Right;   EXCISION BONE CYST Right 2010   RIGHT SHOULDER   GANGLION CYST EXCISION Left 06/02/2012   Procedure: Excision ganglion left dorsal wrist;  Surgeon: Darreld Mclean, MD;  Location: [facility];  Service: Orthopedics;  Laterality: Left;   HOLMIUM LASER APPLICATION Right 05/09/2019   Procedure: HOLMIUM LASER APPLICATION;  Surgeon: Noel Christmas, MD;  Location: [facility];  Service: Urology;  Laterality: Right;   kidney stones     LUMBAR MICRODISCECTOMY  07-16-2000   @[facility]    L 2  -3   POSTERIOR LAMINECTOMY / DECOMPRESSION LUMBAR SPINE  06-26-2009   @[facility]    w/ LATERAL MASS FUSION , L3 -- 5   SHOULDER ARTHROSCOPY WITH DISTAL CLAVICLE RESECTION Right 01-10-2009   @[facility]    w/ DEBRIDEMENT AND EXCISION RECURRENT GANGLION CYST    Social History   Socioeconomic History   Marital status: Widowed    Spouse name: Not on file   Number of children: 1   Years of education: Not on file   Highest education level: Not on file   Occupational History   Occupation: Retired    Comment: [employer]  Tobacco Use   Smoking status: Never    Passive exposure: Past   Smokeless tobacco: Never  Vaping Use   Vaping status: Never Used  Substance and Sexual Activity   Alcohol use: No   Drug use: Never   Sexual activity: Not Currently    Birth control/protection: Post-menopausal  Other Topics Concern   Not on file  Social History Narrative   Husband passed away 2016/04/09. It has been financially harder to pay the bills since his passing, but she is making it. Has grandchildren around which makes it easier.    Social Drivers of Health  Financial Resource Strain: Low Risk  (08/05/2022)   Overall Financial Resource Strain (CARDIA)    Difficulty of Paying Living Expenses: Not hard at all  Food Insecurity: No Food Insecurity (08/05/2022)   Hunger Vital Sign    Worried About Running Out of Food in the Last Year: Never true    Ran Out of Food in the Last Year: Never true  Transportation Needs: No Transportation Needs (08/05/2022)   PRAPARE - Administrator, Civil Service (Medical): No    Lack of Transportation (Non-Medical): No  Physical Activity: Insufficiently Active (08/05/2022)   Exercise Vital Sign    Days of Exercise per Week: 3 days    Minutes of Exercise per Session: 10 min  Stress: No Stress Concern Present (08/05/2022)   [employer] of Occupational Health - Occupational Stress Questionnaire    Feeling of Stress : Not at all  Social Connections: Socially Integrated (08/05/2022)   Social Connection and Isolation Panel [NHANES]    Frequency of Communication with Friends and Family: More than three times a week    Frequency of Social Gatherings with Friends and Family: More than three times a week    Attends Religious Services: More than 4 times per year    Active Member of [employer] or Organizations: Yes    Attends Engineer, structural: More than 4 times per year    Marital Status: Married   Catering manager Violence: Not At Risk (08/05/2022)   Humiliation, Afraid, Rape, and Kick questionnaire    Fear of Current or Ex-Partner: No    Emotionally Abused: No    Physically Abused: No    Sexually Abused: No    Family History  Problem Relation Age of Onset   Stroke Mother    Cancer Father    Diabetes Sister    Diabetes Sister    Kidney failure Brother    Kidney failure Brother    Pancreatic cancer Sister    Kidney Stones Sister        Only one Kidney   Arthritis Sister    Heart disease Daughter     Anti-infectives: Anti-infectives (From admission, onward)    None       Current Outpatient Medications  Medication Sig Dispense Refill   acetaminophen (TYLENOL) 500 MG tablet Take 1,000 mg by mouth every 6 (six) hours as needed (for pain).      albuterol (VENTOLIN HFA) 108 (90 Base) MCG/ACT inhaler TAKE 2 PUFFS BY MOUTH EVERY 6 HOURS AS NEEDED FOR WHEEZE OR SHORTNESS OF BREATH 18 each 2   alendronate (FOSAMAX) 70 MG tablet TAKE 1 TABLET BY MOUTH EVERY 7 DAYS. TAKE WITH A FULL GLASS OF WATER ON AN EMPTY STOMACH. 12 tablet 1   amLODipine (NORVASC) 5 MG tablet TAKE 1 TABLET BY MOUTH EVERY DAY 90 tablet 0   cetirizine (ZYRTEC) 10 MG tablet TAKE 1 TABLET BY MOUTH EVERY DAY 90 tablet 1   fluticasone (FLONASE) 50 MCG/ACT nasal spray SPRAY 2 SPRAYS INTO EACH NOSTRIL EVERY DAY 48 mL 1   folic acid (FOLVITE) 1 MG tablet TAKE 1 TABLET BY MOUTH EVERY DAY 90 tablet 3   gabapentin (NEURONTIN) 100 MG capsule Take 1 capsule (100 mg total) by mouth at bedtime. 90 capsule 3   hydrochlorothiazide (MICROZIDE) 12.5 MG capsule TAKE 1 CAPSULE BY MOUTH EVERY DAY 90 capsule 1   methotrexate (RHEUMATREX) 2.5 MG tablet TAKE 3 TABLETS BY MOUTH EACH WEEK ON THE SAME DAY. 36 tablet 0   montelukast (  SINGULAIR) 10 MG tablet TAKE 1 TABLET BY MOUTH EVERYDAY AT BEDTIME 90 tablet 1   potassium chloride SA (KLOR-CON M20) 20 MEQ tablet TAKE 1 TABLET BY MOUTH TWICE A DAY 180 tablet 0   predniSONE (DELTASONE)  1 MG tablet TAKE 4 TABLETS (4 MG TOTAL) BY MOUTH DAILY WITH BREAKFAST 360 tablet 0   promethazine-dextromethorphan (PROMETHAZINE-DM) 6.25-15 MG/5ML syrup Take 5 mLs by mouth 4 (four) times daily as needed for cough. 118 mL 0   Propylene Glycol (SYSTANE BALANCE OP) Place 1 drop into both eyes daily as needed (for dry eyes).      rosuvastatin (CRESTOR) 5 MG tablet TAKE 1 TABLET (5 MG TOTAL) BY MOUTH DAILY. 90 tablet 1   Vibegron (GEMTESA) 75 MG TABS Take 1 tablet (75 mg total) by mouth daily. 42 tablet 0   No current facility-administered medications for this visit.     Objective: Vital signs in last 24 hours: BP (!) 156/61   Pulse (!) 52   Intake/Output from previous day: No intake/output data recorded. Intake/Output this shift: @IOTHISSHIFT @   Physical Exam  Lab Results:  Results for orders placed or performed in visit on 02/04/23 (from the past 24 hours)  Urinalysis, Routine w reflex microscopic     Status: Abnormal   Collection Time: 02/04/23 10:16 AM  Result Value Ref Range   Specific Gravity, UA 1.020 1.005 - 1.030   pH, UA 6.0 5.0 - 7.5   Color, UA Yellow Yellow   Appearance Ur Clear Clear   Leukocytes,UA Negative Negative   Protein,UA Negative Negative/Trace   Glucose, UA Negative Negative   Ketones, UA Negative Negative   RBC, UA Trace (A) Negative   Bilirubin, UA Negative Negative   Urobilinogen, Ur 0.2 0.2 - 1.0 mg/dL   Nitrite, UA Negative Negative   Microscopic Examination See below:    Narrative   Performed at:  [address] - [facility] [address] [place], [place]  [id number] Lab Director: Chinita Pester MT, Phone:  [phone]  Microscopic Examination     Status: None   Collection Time: 02/04/23 10:16 AM   Urine  Result Value Ref Range   WBC, UA 0-5 0 - 5 /hpf   RBC, Urine 0-2 0 - 2 /hpf   Epithelial Cells (non renal) 0-10 0 - 10 /hpf   Bacteria, UA None seen None seen/Few   Narrative   Performed at:  [address] - [facility] [address] [place], [place]  [id number] Lab Director: Chinita Pester MT, Phone:  [phone]    Recent Results (from the past 2160 hours)  Urinalysis     Status: Abnormal   Collection Time: 12/07/22 11:12 AM  Result Value Ref Range   Specific Gravity, UA 1.025 1.005 - 1.030   pH, UA 5.5 5.0 - 7.5   Color, UA Yellow Yellow   Appearance Ur Clear Clear   Leukocytes,UA 2+ (A) Negative   Protein,UA Negative Negative/Trace   Glucose, UA Negative Negative   Ketones, UA Negative Negative   RBC, UA Trace (A) Negative   Bilirubin, UA Negative Negative   Urobilinogen, Ur 0.2 0.2 - 1.0 mg/dL   Nitrite, UA Negative Negative  Urine Culture     Status: Abnormal   Collection Time: 12/07/22 12:00 PM   Specimen: Urine   UR  Result Value Ref Range   Urine Culture, Routine Final report (A)    Organism ID, Bacteria Citrobacter koseri (A)     Comment: Greater than 100,000 colony forming units  per mL   Antimicrobial Susceptibility Comment     Comment:       ** S = Susceptible; I = Intermediate; R = Resistant **                    P = Positive; N = Negative             MICS are expressed in micrograms per mL    Antibiotic                 RSLT#1    RSLT#2    RSLT#3    RSLT#4 Amoxicillin/Clavulanic Acid    S Cefepime                       S Ceftriaxone                    S Cefuroxime                     I Ciprofloxacin                  S Ertapenem                      S Gentamicin                     S Imipenem                       S Levofloxacin                   S Meropenem                      S Nitrofurantoin                 S Piperacillin/Tazobactam        S Tetracycline                   S Tobramycin                     S Trimethoprim/Sulfa             S   Urinalysis, Routine w reflex microscopic     Status: Abnormal   Collection Time: 02/04/23 10:16 AM  Result Value Ref Range   Specific Gravity, UA 1.020 1.005 - 1.030   pH, UA 6.0 5.0 - 7.5   Color, UA Yellow Yellow   Appearance Ur Clear  Clear   Leukocytes,UA Negative Negative   Protein,UA Negative Negative/Trace   Glucose, UA Negative Negative   Ketones, UA Negative Negative   RBC, UA Trace (A) Negative   Bilirubin, UA Negative Negative   Urobilinogen, Ur 0.2 0.2 - 1.0 mg/dL   Nitrite, UA Negative Negative   Microscopic Examination See below:     Comment: Microscopic was indicated and was performed.  Microscopic Examination     Status: None   Collection Time: 02/04/23 10:16 AM   Urine  Result Value Ref Range   WBC, UA 0-5 0 - 5 /hpf   RBC, Urine 0-2 0 - 2 /hpf   Epithelial Cells (non renal) 0-10 0 - 10 /hpf   Bacteria, UA None seen None seen/Few       BMET No results for input(s): "NA", "K", "CL", "CO2", "GLUCOSE", "BUN", "CREATININE", "CALCIUM" in the last 72 hours.  PT/INR No results for input(s): "LABPROT", "INR" in the last 72 hours. ABG No results for input(s): "PHART", "HCO3" in the last 72 hours.  Invalid input(s): "PCO2", "PO2" UA reviewed.   Recent culture reviewed.  Studies/Results:   No results found.  EPIC records reviewed.   Assessment/Plan: Recurrent UTI with prior sepsis.   She had her last UTI on 12/07/22.   History of nephrolithiasis but renal US on 12/31/21 showed no stones but stable renal cysts and cortical atrophy.     Mixed incontinence with nocturia.  Stable and she is interested in therapy.  I will have her try Gemtesa and reviewed the instructions and side effects.   She will return in a month for a PVR and UA.   Meds ordered this encounter  Medications   Vibegron (GEMTESA) 75 MG TABS    Sig: Take 1 tablet (75 mg total) by mouth daily.    Dispense:  42 tablet    Refill:  0      Orders Placed This Encounter  Procedures   Microscopic Examination   Urinalysis, Routine w reflex microscopic     Return in about 4 weeks (around 03/04/2023) for with Stacy Meyers for a UA and PVR. Marland Kitchen    CC: Dr. [name].     Bjorn Pippin 02/05/2023 [phone] ID: Sherol Dade, female   DOB: 07-29-1932, 89 y.o.   MRN: [id number]

## 2023-02-05 ENCOUNTER — Other Ambulatory Visit: Payer: Self-pay | Admitting: Physician Assistant

## 2023-02-05 ENCOUNTER — Ambulatory Visit (INDEPENDENT_AMBULATORY_CARE_PROVIDER_SITE_OTHER): Payer: Medicare PPO | Admitting: Family Medicine

## 2023-02-05 ENCOUNTER — Encounter: Payer: Self-pay | Admitting: Family Medicine

## 2023-02-05 VITALS — BP 131/75 | HR 72 | Ht 59.0 in | Wt 139.1 lb

## 2023-02-05 DIAGNOSIS — Z1231 Encounter for screening mammogram for malignant neoplasm of breast: Secondary | ICD-10-CM | POA: Diagnosis not present

## 2023-02-05 DIAGNOSIS — E782 Mixed hyperlipidemia: Secondary | ICD-10-CM | POA: Diagnosis not present

## 2023-02-05 DIAGNOSIS — E559 Vitamin D deficiency, unspecified: Secondary | ICD-10-CM

## 2023-02-05 DIAGNOSIS — K429 Umbilical hernia without obstruction or gangrene: Secondary | ICD-10-CM | POA: Diagnosis not present

## 2023-02-05 DIAGNOSIS — I10 Essential (primary) hypertension: Secondary | ICD-10-CM | POA: Diagnosis not present

## 2023-02-05 DIAGNOSIS — R7303 Prediabetes: Secondary | ICD-10-CM

## 2023-02-05 DIAGNOSIS — M0579 Rheumatoid arthritis with rheumatoid factor of multiple sites without organ or systems involvement: Secondary | ICD-10-CM

## 2023-02-05 DIAGNOSIS — Z0001 Encounter for general adult medical examination with abnormal findings: Secondary | ICD-10-CM | POA: Diagnosis not present

## 2023-02-05 NOTE — Assessment & Plan Note (Signed)
Easily reducible supraumbilical hernia, repord as wide neck 10 years ago. Denies pain or nausea or vomit. Will offer evl by gen surg due to expressed concern from grand daughter

## 2023-02-05 NOTE — Patient Instructions (Addendum)
F/U in 6  months, call if you need me sooner  Fasting CBC, lipid, cmp and EGFr, hBa1C, TSH and vit d in next 1 week  Please schedule mammogram at checkout  Yes you do have a hernia ,it is easily reduced. And does not hurt. Keeping a watch on it I believe is best , I am very open to having surgery consult as well for another eye on the problem and will wait to hear from your grand daughter as to her preference  You need covid vaccine and TdAP , both re at the pharmacy  Happy Birthday on 02/18/2021!, and wishing you many more  Thanks for choosing [facility], we consider it a privelige to serve you.

## 2023-02-05 NOTE — Assessment & Plan Note (Signed)
Annual exam as documented. . Immunization and cancer screening needs are specifically addressed at this visit.  

## 2023-02-07 NOTE — Progress Notes (Signed)
    Stacy Meyers     MRN: [id number]      DOB: 11/16/1932  Chief Complaint  Patient presents with   Annual Exam    CPE rheumatology gave her something for her bladder, check hernia    HPI: Patient is in for annual physical exam. Concerns as above Immunization is reviewed , and  updated if needed.   PE: BP 131/75 (BP Location: Right Arm, Patient Position: Sitting, Cuff Size: Large)   Pulse 72   Ht 4\' 11"  (1.499 m)   Wt 139 lb 1.9 oz (63.1 kg)   SpO2 95%   BMI 28.10 kg/m   Pleasant  female, alert and oriented x 3, in no cardio-pulmonary distress. Afebrile. HEENT No facial trauma or asymetry. Sinuses non tender.  Extra occullar muscles intact.. External ears normal, . Neck: decreased ROM , no adenopathy,JVD or thyromegaly.No bruits.  Chest: Clear to ascultation bilaterally.No crackles or wheezes. Non tender to palpation  Breast: Asymptomatic, not examined, mammogram to be scheduled pt  preference.  Cardiovascular system; Heart sounds normal,  S1 and  S2 ,no S3.  Peripheral pulses normal.  Abdomen: Soft, non tender supraumbilical hernia, easily reduced , non tender.  No guarding, tenderness or rebound.     Musculoskeletal exam: Decreased  ROM of spine, hips , shoulders and knees.  deformity , noted.  muscle wasting   Neurologic: Cranial nerves 2 to 12 intact. Power, tone ,sensation  normal throughout. disturbance in gait. No tremor.  Skin: Intact, no ulceration, erythema , scaling or rash noted. Pigmentation normal throughout  Psych; Normal mood and affect. Judgement and concentration normal   Assessment & Plan:  Encounter for Medicare annual examination with abnormal findings Annual exam as documented. Immunization and cancer screening needs are specifically addressed at this visit.   Umbilical hernia without obstruction and without gangrene Easily reducible supraumbilical hernia, repord as wide neck 10 years ago. Denies pain or nausea or vomit.  Will offer evl by gen surg due to expressed concern from grand daughter

## 2023-02-08 ENCOUNTER — Other Ambulatory Visit: Payer: Self-pay | Admitting: *Deleted

## 2023-02-08 ENCOUNTER — Other Ambulatory Visit: Payer: Self-pay | Admitting: Neurology

## 2023-02-08 DIAGNOSIS — M0579 Rheumatoid arthritis with rheumatoid factor of multiple sites without organ or systems involvement: Secondary | ICD-10-CM

## 2023-02-08 DIAGNOSIS — E782 Mixed hyperlipidemia: Secondary | ICD-10-CM | POA: Diagnosis not present

## 2023-02-08 DIAGNOSIS — I10 Essential (primary) hypertension: Secondary | ICD-10-CM | POA: Diagnosis not present

## 2023-02-08 DIAGNOSIS — Z79899 Other long term (current) drug therapy: Secondary | ICD-10-CM

## 2023-02-08 DIAGNOSIS — E785 Hyperlipidemia, unspecified: Secondary | ICD-10-CM

## 2023-02-08 DIAGNOSIS — R7303 Prediabetes: Secondary | ICD-10-CM | POA: Diagnosis not present

## 2023-02-08 DIAGNOSIS — E559 Vitamin D deficiency, unspecified: Secondary | ICD-10-CM | POA: Diagnosis not present

## 2023-02-08 NOTE — Telephone Encounter (Signed)
Dr. Teresa Coombs looks like on crestor 5mg  now instead is this ok to refuse?

## 2023-02-08 NOTE — Telephone Encounter (Signed)
Last Fill: 11/09/2022   Labs: 09/01/2022 GFR 59, Potassium 3.1 09/21/2022 RBC count, hemoglobin, and hematocrit are low. Rest of CBC WNL.      Next Visit: 03/03/2023   Last Visit: 09/21/2022   DX:  Rheumatoid arthritis with rheumatoid factor of multiple sites without organ or systems involvement    Current Dose per office note 09/21/2022: Methotrexate 3 tablets by mouth every 7 days   Patient to update labs at upcoming appointment on 03/03/2023   Okay to refill Methotrexate?

## 2023-02-09 ENCOUNTER — Encounter: Payer: Self-pay | Admitting: Family Medicine

## 2023-02-09 LAB — LIPID PANEL
Chol/HDL Ratio: 2 {ratio} (ref 0.0–4.4)
Cholesterol, Total: 133 mg/dL (ref 100–199)
HDL: 66 mg/dL (ref >39)
LDL Chol Calc (NIH): 51 mg/dL (ref 0–99)
Triglycerides: 83 mg/dL (ref 0–149)
VLDL Cholesterol Cal: 16 mg/dL (ref 5–40)

## 2023-02-09 LAB — CMP14+EGFR
ALT: 9 [IU]/L (ref 0–32)
AST: 19 [IU]/L (ref 0–40)
Albumin: 4.5 g/dL (ref 3.7–4.7)
Alkaline Phosphatase: 61 [IU]/L (ref 44–121)
BUN/Creatinine Ratio: 20 (ref 12–28)
BUN: 20 mg/dL (ref 8–27)
Bilirubin Total: 0.3 mg/dL (ref 0.0–1.2)
CO2: 23 mmol/L (ref 20–29)
Calcium: 10.7 mg/dL — ABNORMAL HIGH (ref 8.7–10.3)
Chloride: 104 mmol/L (ref 96–106)
Creatinine, Ser: 0.99 mg/dL (ref 0.57–1.00)
Globulin, Total: 2.4 g/dL (ref 1.5–4.5)
Glucose: 165 mg/dL — ABNORMAL HIGH (ref 70–99)
Potassium: 4.4 mmol/L (ref 3.5–5.2)
Sodium: 143 mmol/L (ref 134–144)
Total Protein: 6.9 g/dL (ref 6.0–8.5)
eGFR: 55 mL/min/{1.73_m2} — ABNORMAL LOW (ref >59)

## 2023-02-09 LAB — CBC
Hematocrit: 37.1 % (ref 34.0–46.6)
Hemoglobin: 12.5 g/dL (ref 11.1–15.9)
MCH: 30.5 pg (ref 26.6–33.0)
MCHC: 33.7 g/dL (ref 31.5–35.7)
MCV: 91 fL (ref 79–97)
Platelets: 265 10*3/uL (ref 150–450)
RBC: 4.1 x10E6/uL (ref 3.77–5.28)
RDW: 13.9 % (ref 11.7–15.4)
WBC: 7.9 10*3/uL (ref 3.4–10.8)

## 2023-02-09 LAB — TSH: TSH: 0.991 u[IU]/mL (ref 0.450–4.500)

## 2023-02-09 LAB — HEMOGLOBIN A1C
Est. average glucose Bld gHb Est-mCnc: 140 mg/dL
Hgb A1c MFr Bld: 6.5 % — ABNORMAL HIGH (ref 4.8–5.6)

## 2023-02-09 LAB — VITAMIN D 25 HYDROXY (VIT D DEFICIENCY, FRACTURES): Vit D, 25-Hydroxy: 32.4 ng/mL (ref 30.0–100.0)

## 2023-02-11 ENCOUNTER — Other Ambulatory Visit: Payer: Self-pay

## 2023-02-11 DIAGNOSIS — E663 Overweight: Secondary | ICD-10-CM

## 2023-02-17 NOTE — Progress Notes (Signed)
 Office Visit Note  Patient: Stacy Meyers             Date of Birth: 08-02-1932           MRN: [id number]             PCP: Kerri Perches, MD Referring: Kerri Perches, MD Visit Date: 03/03/2023 Occupation: @GUAROCC @  Subjective:  No chief complaint on file.   History of Present Illness: Stacy Meyers is a 89 y.o. female with seropositive rheumatoid arthritis and osteoarthritis.  She returns today after her last visit on September 21, 2022.  She denies having a rheumatoid arthritis flare.  She is on methotrexate 3 tablets p.o. weekly along with folic acid 1 mg daily.  She continues to take prednisone 4 mg daily.    Activities of Daily Living:  Patient reports morning stiffness for 20-30 minutes.   Patient Denies nocturnal pain.  Difficulty dressing/grooming: Denies Difficulty climbing stairs: Denies Difficulty getting out of chair: Denies Difficulty using hands for taps, buttons, cutlery, and/or writing: Denies  Review of Systems  Constitutional:  Positive for fatigue.  HENT:  Positive for mouth dryness. Negative for mouth sores.   Eyes:  Positive for dryness.  Respiratory:  Negative for shortness of breath and difficulty breathing.   Cardiovascular:  Negative for chest pain and palpitations.  Gastrointestinal:  Negative for blood in stool, constipation and diarrhea.  Endocrine: Negative for increased urination.  Genitourinary:  Negative for involuntary urination.  Musculoskeletal:  Positive for joint pain, joint pain and morning stiffness. Negative for gait problem, joint swelling, myalgias, muscle weakness, muscle tenderness and myalgias.  Skin:  Negative for color change, rash, hair loss and sensitivity to sunlight.  Allergic/Immunologic: Negative for susceptible to infections.  Neurological:  Positive for dizziness and headaches.  Hematological:  Negative for swollen glands.  Psychiatric/Behavioral:  Negative for depressed mood and sleep disturbance. The  patient is not nervous/anxious.     PMFS History:  Patient Active Problem List   Diagnosis Date Noted   Fall 12/07/2022   Seasonal allergic rhinitis due to pollen 05/27/2022   Encounter for Medicare annual examination with abnormal findings 01/22/2022   Reduced vision 01/22/2022   New onset of headaches 01/22/2022   Age related osteoporosis 02/07/2021   Mass of left lower leg 10/24/2020   Headache 08/26/2020   Prediabetes 10/28/2019   H/o Chronic heart failure with preserved ejection fraction (HFpEF) (HCC) 04/03/2019   Umbilical hernia without obstruction and without gangrene 12/10/2018   Fibroids 05/08/2016   Spondylosis of lumbar region without myelopathy or radiculopathy 12/24/2015   Solitary bone cyst of right shoulder 05/28/2015   Right knee pain 05/28/2015   Osteoarthritis of both shoulders 05/28/2015   Overweight (BMI 25.0-29.9) 04/28/2014   Ganglion cyst of wrist 05/11/2012   Systolic murmur 04/16/2011   Diastolic dysfunction 12/15/2010   Hypokalemia 12/14/2010   Hypercalcemia 12/14/2010   Seasonal allergies 08/28/2010   Herpes simplex virus (HSV) infection 09/16/2008   Rheumatoid arthritis with rheumatoid factor of multiple sites without organ or systems involvement (HCC) 09/16/2008   Mixed hyperlipidemia 05/23/2008   Essential hypertension, benign 05/23/2008    Past Medical History:  Diagnosis Date   Acid reflux    Acute cystitis with hematuria 04/11/2011   Anemia    ARF (acute renal failure) (HCC) 12/14/2010   Arthritis    CKD (chronic kidney disease), stage II    previously seen by nephrologist--- dr Lowell Guitar,  pt released per lov in epic 09-24-2017  Diabetes mellitus, type II (HCC)    Diverticulosis of colon    Essential hypertension, benign    followed by pcp   (pt had nuclear stress test (in epic under media) dated 05-25-2008 showed normal perfusion w/ no evidence ischemia, ef 83%   Heart disease    History of bradycardia    previously seen by cardiologist,  dr branch, pt released per lov 08-11-2018 in epic, bradycardia resolved   History of sepsis    04-03-2019 hospital admission w/ urosepsis due to obstructive uropathy due to right ureter stone   Hypokalemia    Lipoma of colon    ascending   Lumbar spondylosis    Mixed hyperlipidemia    Nephrolithiasis    per CT 04-03-2019 nonobstructive bilateral stones   Ocular herpes    Osteoarthritis    followed by dr Corliss Skains--- hands, shoulders, feet   Osteoporosis    Rheumatoid arthritis(714.0)    rheumotologist-- dr s. Corliss Skains---  multiple sites, treated with methotrexate/ prednisone   Right nephrolithiasis-1.4cm Moderate right-sided urinary tract obstruction/hydronephrosis 04/03/2019   Right ureteral calculus    Seasonal allergies    Sepsis (HCC) 04/03/2019   Sepsis secondary to UTI /Nephrolithiasis with obstructive uropathy 04/03/2019   SUI (stress urinary incontinence, female)    Uterine fibroid     Family History  Problem Relation Age of Onset   Stroke Mother    Cancer Father    Diabetes Sister    Diabetes Sister    Kidney failure Brother    Kidney failure Brother    Pancreatic cancer Sister    Kidney Stones Sister        Only one Kidney   Arthritis Sister    Heart disease Daughter    Past Surgical History:  Procedure Laterality Date   BUNIONECTOMY Right 2012   CATARACT EXTRACTION W/ INTRAOCULAR LENS  IMPLANT, BILATERAL  2009   CHOLECYSTECTOMY  12/17/2010   Procedure: LAPAROSCOPIC CHOLECYSTECTOMY;  Surgeon: Fabio Bering;  Location: [facility];  Service: General;  Laterality: N/A;   COLONOSCOPY  05/22/2011   Procedure: COLONOSCOPY;  Surgeon: Malissa Hippo, MD;  Location: [facility];  Service: Endoscopy;  Laterality: N/A;  1045   CYSTOSCOPY WITH RETROGRADE PYELOGRAM, URETEROSCOPY AND STENT PLACEMENT Right 05/09/2019   Procedure: CYSTOSCOPY WITH RETROGRADE PYELOGRAM, URETEROSCOPY AND STENT PLACEMENT;  Surgeon: Noel Christmas, MD;  Location: [facility];   Service: Urology;  Laterality: Right;  1 HR   CYSTOSCOPY WITH STENT PLACEMENT Right 04/03/2019   Procedure: CYSTOSCOPY, RIGHT RETROGRADE PYELOGRAM  WITH STENT PLACEMENT;  Surgeon: Noel Christmas, MD;  Location: [facility];  Service: Urology;  Laterality: Right;   EXCISION BONE CYST Right 2010   RIGHT SHOULDER   GANGLION CYST EXCISION Left 06/02/2012   Procedure: Excision ganglion left dorsal wrist;  Surgeon: Darreld Mclean, MD;  Location: [facility];  Service: Orthopedics;  Laterality: Left;   HOLMIUM LASER APPLICATION Right 05/09/2019   Procedure: HOLMIUM LASER APPLICATION;  Surgeon: Noel Christmas, MD;  Location: [facility];  Service: Urology;  Laterality: Right;   kidney stones     LUMBAR MICRODISCECTOMY  07-16-2000   @[facility]    L 2  -3   POSTERIOR LAMINECTOMY / DECOMPRESSION LUMBAR SPINE  06-26-2009   @[facility]    w/ LATERAL MASS FUSION , L3 -- 5   SHOULDER ARTHROSCOPY WITH DISTAL CLAVICLE RESECTION Right 01-10-2009   @[facility]    w/ DEBRIDEMENT AND EXCISION RECURRENT GANGLION CYST   Social History   Social  History Narrative   Husband passed away 2016-04-17. It has been financially harder to pay the bills since his passing, but she is making it. Has grandchildren around which makes it easier.    Immunization History  Administered Date(s) Administered   Fluad Quad(high Dose 65+) 10/19/2019, 09/25/2020, 10/01/2021   Influenza Split 11/07/2013   Influenza Whole 10/28/2009, 09/17/2010   Influenza, High Dose Seasonal PF 09/29/2016, 09/15/2018, 10/01/2021, 09/27/2022   Influenza,inj,Quad PF,6+ Mos 09/03/2014   Influenza-Unspecified 10/05/2013, 09/29/2016   Moderna Covid-19 Fall Seasonal Vaccine 34yrs & older 10/19/2021, 09/27/2022   Moderna Covid-19 Vaccine Bivalent Booster 61yrs & up 11/01/2020   Moderna Sars-Covid-2 Vaccination 01/17/2019, 02/27/2019, 10/30/2019, 04/28/2020, 10/19/2021   PPD Test 05/14/2014, 05/28/2015   Pneumococcal Conjugate-13 08/03/2013   Pneumococcal  Polysaccharide-23 08/28/2009   Tdap 04/29/2010     Objective: Vital Signs: BP 136/84 (BP Location: Left Arm, Patient Position: Sitting, Cuff Size: Normal)   Pulse (!) 53   Resp 12   Ht 4\' 11"  (1.499 m)   Wt 140 lb 6.4 oz (63.7 kg)   BMI 28.36 kg/m    Physical Exam Vitals and nursing note reviewed.  Constitutional:      Appearance: She is well-developed.  HENT:     Head: Normocephalic and atraumatic.  Eyes:     Conjunctiva/sclera: Conjunctivae normal.  Cardiovascular:     Rate and Rhythm: Normal rate and regular rhythm.     Heart sounds: Normal heart sounds.  Pulmonary:     Effort: Pulmonary effort is normal.     Breath sounds: Normal breath sounds.  Abdominal:     General: Bowel sounds are normal.     Palpations: Abdomen is soft.  Musculoskeletal:     Cervical back: Normal range of motion.  Lymphadenopathy:     Cervical: No cervical adenopathy.  Skin:    General: Skin is warm and dry.     Capillary Refill: Capillary refill takes less than 2 seconds.  Neurological:     Mental Status: She is alert and oriented to person, place, and time.  Psychiatric:        Behavior: Behavior normal.      Musculoskeletal Exam: She had limited lateral rotation of the cervical spine.  Thoracic kyphosis was noted.  There was no tenderness over thoracic or lumbar spine.  Shoulder abduction was limited bilaterally to 30 degrees.  Elbow joints were in good range of motion without any warmth swelling or effusion.  There was no tenderness over wrist joints or MCP PIPs and DIPs.  Bilateral ulnar deviation was noted.  Bilateral wrist joint and MCP thickening was noted.  Hip joints could not be assessed in the seated position.  She had no discomfort with range of motion of her knee joints with thickening and limited range of motion of her right knee joint.  There was no tenderness over ankles or MTPs.  CDAI Exam: CDAI Score: -- Patient Global: 20 / 100; Provider Global: 20 / 100 Swollen: --;  Tender: -- Joint Exam 03/03/2023   No joint exam has been documented for this visit   There is currently no information documented on the homunculus. Go to the Rheumatology activity and complete the homunculus joint exam.  Investigation: No additional findings.  Imaging: No results found.  Recent Labs: Lab Results  Component Value Date   WBC 7.9 02/08/2023   HGB 12.5 02/08/2023   PLT 265 02/08/2023   NA 143 02/08/2023   K 4.4 02/08/2023   CL 104 02/08/2023  CO2 23 02/08/2023   GLUCOSE 165 (H) 02/08/2023   BUN 20 02/08/2023   CREATININE 0.99 02/08/2023   BILITOT 0.3 02/08/2023   ALKPHOS 61 02/08/2023   AST 19 02/08/2023   ALT 9 02/08/2023   PROT 6.9 02/08/2023   ALBUMIN 4.5 02/08/2023   CALCIUM 10.7 (H) 02/08/2023   GFRAA 70 03/12/2020   February 08, 2023 lipid panel was within normal limits.  LDL 51 Speciality Comments: No specialty comments available.  Procedures:  No procedures performed Allergies: Aspirin and Sudafed [pseudoephedrine hcl]   Assessment / Plan:     Visit Diagnoses: Rheumatoid arthritis with rheumatoid factor of multiple sites without organ or systems involvement (HCC) - longstanding history of rheumatoid arthritis: Patient reports no discomfort or joint pain today.  She has been taking methotrexate and prednisone on a regular basis without any interruption.  No synovitis was noted on the examination.  Synovial thickening was noted over bilateral wrist joints and MCP joints.  Ulnar deviation was noted in bilateral hands.  High risk medication use - Methotrexate 3 tablets by mouth every 7 days, folic acid 1 mg daily, and prednisone 1 mg 4 tablets daily.  February 08, 2023 CBC and CMP were normal except for elevated glucose and elevated hemoglobin A1c.  Lipid panel was normal.  She was advised to get repeat labs in May and every 3 months.  Information on immunization was placed in the AVS.  She was advised to hold methotrexate if she develops an infection  resume after the infection resolves.  Primary osteoarthritis of both hands-she has rheumatoid arthritis and osteoarthritis overlap with synovial thickening over wrist joints and MCP joints.  Bilateral PIP and DIP thickening was noted.  Joint protection was discussed.  Primary osteoarthritis of both shoulders-she has limited abduction of bilateral shoulder joints up to 30 degrees.  Primary osteoarthritis of both knees-she continue to have discomfort in her knee joints.  Right knee joint thickening and limited active range of motion was noted.  She denied any discomfort.  Primary osteoarthritis of both feet-she had no tenderness or synovitis on the examination today.  Spondylosis of lumbar region without myelopathy or radiculopathy-she denies lower back pain.  She had no point tenderness.  Age-related osteoporosis without current pathological fracture - DEXA 08/06/21:radius 33% 0.569 T-score -2.0.DEXA 07/31/19 Dr. Simpson:Left femur Neck:T-score -2.0, BMD 0.764. alendronate 70 mg p.o. weekly prescribed by her PCP.  Type 2 diabetes, diet controlled (HCC)-hemoglobin A1c was 6.5 on February 08, 2023.  Essential hypertension-blood pressure is normal today at 136/84.  Systolic murmur  Orders: No orders of the defined types were placed in this encounter.  No orders of the defined types were placed in this encounter.    Follow-Up Instructions: Return in about 5 months (around 07/31/2023) for Rheumatoid arthritis.   Pollyann Savoy, MD  Note - This record has been created using Animal nutritionist.  Chart creation errors have been sought, but may not always  have been located. Such creation errors do not reflect on  the standard of medical care.

## 2023-03-03 ENCOUNTER — Other Ambulatory Visit: Payer: Self-pay | Admitting: Rheumatology

## 2023-03-03 ENCOUNTER — Ambulatory Visit: Payer: Medicare PPO | Attending: Rheumatology | Admitting: Rheumatology

## 2023-03-03 ENCOUNTER — Encounter: Payer: Self-pay | Admitting: Rheumatology

## 2023-03-03 VITALS — BP 136/84 | HR 53 | Resp 12 | Ht 59.0 in | Wt 140.4 lb

## 2023-03-03 DIAGNOSIS — M170 Bilateral primary osteoarthritis of knee: Secondary | ICD-10-CM

## 2023-03-03 DIAGNOSIS — M47816 Spondylosis without myelopathy or radiculopathy, lumbar region: Secondary | ICD-10-CM | POA: Diagnosis not present

## 2023-03-03 DIAGNOSIS — E119 Type 2 diabetes mellitus without complications: Secondary | ICD-10-CM | POA: Diagnosis not present

## 2023-03-03 DIAGNOSIS — N3281 Overactive bladder: Secondary | ICD-10-CM | POA: Insufficient documentation

## 2023-03-03 DIAGNOSIS — M810 Age-related osteoporosis without current pathological fracture: Secondary | ICD-10-CM | POA: Diagnosis not present

## 2023-03-03 DIAGNOSIS — M0579 Rheumatoid arthritis with rheumatoid factor of multiple sites without organ or systems involvement: Secondary | ICD-10-CM

## 2023-03-03 DIAGNOSIS — I10 Essential (primary) hypertension: Secondary | ICD-10-CM

## 2023-03-03 DIAGNOSIS — Z79899 Other long term (current) drug therapy: Secondary | ICD-10-CM | POA: Diagnosis not present

## 2023-03-03 DIAGNOSIS — M19011 Primary osteoarthritis, right shoulder: Secondary | ICD-10-CM

## 2023-03-03 DIAGNOSIS — N281 Cyst of kidney, acquired: Secondary | ICD-10-CM | POA: Insufficient documentation

## 2023-03-03 DIAGNOSIS — Z87440 Personal history of urinary (tract) infections: Secondary | ICD-10-CM | POA: Insufficient documentation

## 2023-03-03 DIAGNOSIS — M19072 Primary osteoarthritis, left ankle and foot: Secondary | ICD-10-CM

## 2023-03-03 DIAGNOSIS — M19042 Primary osteoarthritis, left hand: Secondary | ICD-10-CM

## 2023-03-03 DIAGNOSIS — R011 Cardiac murmur, unspecified: Secondary | ICD-10-CM

## 2023-03-03 DIAGNOSIS — M19071 Primary osteoarthritis, right ankle and foot: Secondary | ICD-10-CM

## 2023-03-03 DIAGNOSIS — M19041 Primary osteoarthritis, right hand: Secondary | ICD-10-CM | POA: Diagnosis not present

## 2023-03-03 DIAGNOSIS — M19012 Primary osteoarthritis, left shoulder: Secondary | ICD-10-CM

## 2023-03-03 DIAGNOSIS — N3946 Mixed incontinence: Secondary | ICD-10-CM | POA: Insufficient documentation

## 2023-03-03 DIAGNOSIS — N200 Calculus of kidney: Secondary | ICD-10-CM | POA: Insufficient documentation

## 2023-03-03 NOTE — Telephone Encounter (Signed)
 Last Fill: 02/08/2023 (30 day supply)  Labs: 02/08/2023 Glucose 165, GFR 55, Calcium 10.7  Next Visit: 08/02/2023  Last Visit: 03/03/2023  DX: Rheumatoid arthritis with rheumatoid factor of multiple sites without organ or systems involvement   Current Dose per office note 03/03/2023: Methotrexate 3 tablets by mouth every 7 days   Okay to refill Methotrexate?

## 2023-03-03 NOTE — Progress Notes (Unsigned)
 Name: Stacy Meyers DOB: 08-14-1932 MRN: [id number]  History of Present Illness: Stacy Meyers is a 89 y.o. female who presents today for follow up visit at [facility] . She is accompanied by her grandson Delanie. - GU history: 1. Recurrent UTI. 2. OAB with urinary frequency, nocturia, urgency, and urge incontinence. 3. Stress urinary incontinence. 4. Kidney stones. - 12/31/2021: RUS negative for stones. 5. Renal cysts. - 12/31/2021: RUS showed a 3.4 cm left renal cyst and a 12 mm right renal cyst.  Urine culture results in past 12+ months: - 01/22/2022: Negative - 05/28/2022: Negative - 12/07/2022: Positive for Citrobacter koseri  At last visit with Dr. Annabell Howells on 02/04/2023: Started trial of Gemtesa 75 mg daily.   Today: She reports symptomatic improvement since starting Gemtesa (Vibegron) 75 mg daily. She reports decreased urgency, nocturia, and urge incontinence. She denies hesitancy, straining to void, or sensations of incomplete emptying. She would like to continue that medication.   She denies any stone episodes in past 6 months.    Fall Screening: Do you usually have a device to assist in your mobility? Yes - cane   Medications: Current Outpatient Medications  Medication Sig Dispense Refill   acetaminophen (TYLENOL) 500 MG tablet Take 1,000 mg by mouth every 6 (six) hours as needed (for pain).      albuterol (VENTOLIN HFA) 108 (90 Base) MCG/ACT inhaler TAKE 2 PUFFS BY MOUTH EVERY 6 HOURS AS NEEDED FOR WHEEZE OR SHORTNESS OF BREATH 18 each 2   alendronate (FOSAMAX) 70 MG tablet TAKE 1 TABLET BY MOUTH EVERY 7 DAYS. TAKE WITH A FULL GLASS OF WATER ON AN EMPTY STOMACH. 12 tablet 1   amLODipine (NORVASC) 5 MG tablet TAKE 1 TABLET BY MOUTH EVERY DAY 90 tablet 0   cetirizine (ZYRTEC) 10 MG tablet TAKE 1 TABLET BY MOUTH EVERY DAY 90 tablet 1   fluticasone (FLONASE) 50 MCG/ACT nasal spray SPRAY 2 SPRAYS INTO EACH NOSTRIL EVERY DAY 48 mL 1   folic acid (FOLVITE) 1  MG tablet TAKE 1 TABLET BY MOUTH EVERY DAY 90 tablet 3   gabapentin (NEURONTIN) 100 MG capsule Take 1 capsule (100 mg total) by mouth at bedtime. 90 capsule 3   hydrochlorothiazide (MICROZIDE) 12.5 MG capsule TAKE 1 CAPSULE BY MOUTH EVERY DAY 90 capsule 1   methotrexate (RHEUMATREX) 2.5 MG tablet Take 3 tablets (7.5 mg total) by mouth once a week. 36 tablet 0   montelukast (SINGULAIR) 10 MG tablet TAKE 1 TABLET BY MOUTH EVERYDAY AT BEDTIME 90 tablet 1   potassium chloride SA (KLOR-CON M20) 20 MEQ tablet TAKE 1 TABLET BY MOUTH TWICE A DAY 180 tablet 0   predniSONE (DELTASONE) 1 MG tablet TAKE 4 TABLETS (4 MG TOTAL) BY MOUTH DAILY WITH BREAKFAST 360 tablet 0   promethazine-dextromethorphan (PROMETHAZINE-DM) 6.25-15 MG/5ML syrup Take 5 mLs by mouth 4 (four) times daily as needed for cough. 118 mL 0   Propylene Glycol (SYSTANE BALANCE OP) Place 1 drop into both eyes daily as needed (for dry eyes).      rosuvastatin (CRESTOR) 5 MG tablet TAKE 1 TABLET (5 MG TOTAL) BY MOUTH DAILY. 90 tablet 1   Vibegron (GEMTESA) 75 MG TABS Take 1 tablet (75 mg total) by mouth daily. 90 tablet 3   No current facility-administered medications for this visit.    Allergies: Allergies  Allergen Reactions   Aspirin Other (See Comments)    REACTION: burns stomach (uncoated)   Sudafed [Pseudoephedrine Hcl] Other (See Comments)  Increased joint pain    Past Medical History:  Diagnosis Date   Acid reflux    Acute cystitis with hematuria 04/11/2011   Anemia    ARF (acute renal failure) (HCC) 12/14/2010   Arthritis    CKD (chronic kidney disease), stage II    previously seen by nephrologist--- dr Lowell Guitar,  pt released per lov in epic 09-24-2017   Diabetes mellitus, type II (HCC)    Diverticulosis of colon    Essential hypertension, benign    followed by pcp   (pt had nuclear stress test (in epic under media) dated 05-25-2008 showed normal perfusion w/ no evidence ischemia, ef 83%   Heart disease    History of  bradycardia    previously seen by cardiologist, dr branch, pt released per lov 08-11-2018 in epic, bradycardia resolved   History of sepsis    04-03-2019 hospital admission w/ urosepsis due to obstructive uropathy due to right ureter stone   Hypokalemia    Lipoma of colon    ascending   Lumbar spondylosis    Mixed hyperlipidemia    Nephrolithiasis    per CT 04-03-2019 nonobstructive bilateral stones   Ocular herpes    Osteoarthritis    followed by dr Corliss Skains--- hands, shoulders, feet   Osteoporosis    Rheumatoid arthritis(714.0)    rheumotologist-- dr s. Corliss Skains---  multiple sites, treated with methotrexate/ prednisone   Right nephrolithiasis-1.4cm Moderate right-sided urinary tract obstruction/hydronephrosis 04/03/2019   Right ureteral calculus    Seasonal allergies    Sepsis (HCC) 04/03/2019   Sepsis secondary to UTI /Nephrolithiasis with obstructive uropathy 04/03/2019   SUI (stress urinary incontinence, female)    Uterine fibroid    Past Surgical History:  Procedure Laterality Date   BUNIONECTOMY Right 2012   CATARACT EXTRACTION W/ INTRAOCULAR LENS  IMPLANT, BILATERAL  2009   CHOLECYSTECTOMY  12/17/2010   Procedure: LAPAROSCOPIC CHOLECYSTECTOMY;  Surgeon: Fabio Bering;  Location: [facility];  Service: General;  Laterality: N/A;   COLONOSCOPY  05/22/2011   Procedure: COLONOSCOPY;  Surgeon: Malissa Hippo, MD;  Location: [facility];  Service: Endoscopy;  Laterality: N/A;  1045   CYSTOSCOPY WITH RETROGRADE PYELOGRAM, URETEROSCOPY AND STENT PLACEMENT Right 05/09/2019   Procedure: CYSTOSCOPY WITH RETROGRADE PYELOGRAM, URETEROSCOPY AND STENT PLACEMENT;  Surgeon: Noel Christmas, MD;  Location: [facility];  Service: Urology;  Laterality: Right;  1 HR   CYSTOSCOPY WITH STENT PLACEMENT Right 04/03/2019   Procedure: CYSTOSCOPY, RIGHT RETROGRADE PYELOGRAM  WITH STENT PLACEMENT;  Surgeon: Noel Christmas, MD;  Location: [facility];  Service: Urology;  Laterality: Right;    EXCISION BONE CYST Right 2010   RIGHT SHOULDER   GANGLION CYST EXCISION Left 06/02/2012   Procedure: Excision ganglion left dorsal wrist;  Surgeon: Darreld Mclean, MD;  Location: [facility];  Service: Orthopedics;  Laterality: Left;   HOLMIUM LASER APPLICATION Right 05/09/2019   Procedure: HOLMIUM LASER APPLICATION;  Surgeon: Noel Christmas, MD;  Location: [facility];  Service: Urology;  Laterality: Right;   kidney stones     LUMBAR MICRODISCECTOMY  07-16-2000   @[facility]    L 2  -3   POSTERIOR LAMINECTOMY / DECOMPRESSION LUMBAR SPINE  06-26-2009   @[facility]    w/ LATERAL MASS FUSION , L3 -- 5   SHOULDER ARTHROSCOPY WITH DISTAL CLAVICLE RESECTION Right 01-10-2009   @[facility]    w/ DEBRIDEMENT AND EXCISION RECURRENT GANGLION CYST   Family History  Problem Relation Age of Onset   Stroke Mother  Cancer Father    Diabetes Sister    Diabetes Sister    Kidney failure Brother    Kidney failure Brother    Pancreatic cancer Sister    Kidney Stones Sister        Only one Kidney   Arthritis Sister    Heart disease Daughter    Social History   Socioeconomic History   Marital status: Widowed    Spouse name: Not on file   Number of children: 1   Years of education: Not on file   Highest education level: Not on file  Occupational History   Occupation: Retired    Comment: [employer]  Tobacco Use   Smoking status: Never    Passive exposure: Past   Smokeless tobacco: Never  Vaping Use   Vaping status: Never Used  Substance and Sexual Activity   Alcohol use: No   Drug use: Never   Sexual activity: Not Currently    Birth control/protection: Post-menopausal  Other Topics Concern   Not on file  Social History Narrative   Husband passed away 04/23/16. It has been financially harder to pay the bills since his passing, but she is making it. Has grandchildren around which makes it easier.    Social Drivers of Corporate investment banker Strain: Low Risk  (08/05/2022)   Overall Financial  Resource Strain (CARDIA)    Difficulty of Paying Living Expenses: Not hard at all  Food Insecurity: No Food Insecurity (08/05/2022)   Hunger Vital Sign    Worried About Running Out of Food in the Last Year: Never true    Ran Out of Food in the Last Year: Never true  Transportation Needs: No Transportation Needs (08/05/2022)   PRAPARE - Administrator, Civil Service (Medical): No    Lack of Transportation (Non-Medical): No  Physical Activity: Insufficiently Active (08/05/2022)   Exercise Vital Sign    Days of Exercise per Week: 3 days    Minutes of Exercise per Session: 10 min  Stress: No Stress Concern Present (08/05/2022)   [employer] of Occupational Health - Occupational Stress Questionnaire    Feeling of Stress : Not at all  Social Connections: Socially Integrated (08/05/2022)   Social Connection and Isolation Panel [NHANES]    Frequency of Communication with Friends and Family: More than three times a week    Frequency of Social Gatherings with Friends and Family: More than three times a week    Attends Religious Services: More than 4 times per year    Active Member of [employer] or Organizations: Yes    Attends Engineer, structural: More than 4 times per year    Marital Status: Married  Catering manager Violence: Not At Risk (08/05/2022)   Humiliation, Afraid, Rape, and Kick questionnaire    Fear of Current or Ex-Partner: No    Emotionally Abused: No    Physically Abused: No    Sexually Abused: No    Review of Systems Constitutional: Patient denies any unintentional weight loss or change in strength lntegumentary: Patient denies any rashes or pruritus Cardiovascular: Patient denies chest pain or syncope Respiratory: Patient denies shortness of breath Gastrointestinal: Patient denies nausea, vomiting, constipation, or diarrhea Musculoskeletal: Patient denies muscle cramps or weakness Neurologic: Patient denies convulsions or seizures Allergic/Immunologic:  Patient denies recent allergic reaction(s) Hematologic/Lymphatic: Patient denies bleeding tendencies Endocrine: Patient denies heat/cold intolerance  GU: As per HPI.  OBJECTIVE Vitals:   03/05/23 1046  BP: (!) 141/58  Pulse: Marland Kitchen)  53  Temp: 97.7 F (36.5 C)   There is no height or weight on file to calculate BMI.  Physical Examination Constitutional: No obvious distress; patient is non-toxic appearing  Cardiovascular: No visible lower extremity edema.  Respiratory: The patient does not have audible wheezing/stridor; respirations do not appear labored  Gastrointestinal: Abdomen non-distended Musculoskeletal: Normal ROM of UEs  Skin: No obvious rashes/open sores  Neurologic: CN 2-12 grossly intact Psychiatric: Answered questions appropriately with normal affect  Hematologic/Lymphatic/Immunologic: No obvious bruises or sites of spontaneous bleeding  PVR: 0 ml  ASSESSMENT OAB (overactive bladder) - Plan: Urinalysis, Routine w reflex microscopic, BLADDER SCAN AMB NON-IMAGING, Vibegron (GEMTESA) 75 MG TABS  Mixed stress and urge urinary incontinence - Plan: Urinalysis, Routine w reflex microscopic, BLADDER SCAN AMB NON-IMAGING, Vibegron (GEMTESA) 75 MG TABS  Renal stones  Nocturia - Plan: Vibegron (GEMTESA) 75 MG TABS  Urinary urgency - Plan: Vibegron (GEMTESA) 75 MG TABS  OAB symptoms well managed on Gemtesa; will continue.   Discussed option to get KUB or RUS to recheck stone burden; she elected to hold off on that and will notify provider if stone symptoms arise.   We agreed to plan for follow up in 6 months or sooner if needed. Patient verbalized understanding of and agreement with current plan. All questions were answered.  PLAN Advised the following: 1. Continue Gemtesa (Vibegron) 75 mg daily. 2. Return in about 6 months (around 09/02/2023) for UA, PVR, & f/u with Evette Georges NP.  Orders Placed This Encounter  Procedures   Urinalysis, Routine w reflex microscopic    BLADDER SCAN AMB NON-IMAGING    It has been explained that the patient is to follow regularly with their PCP in addition to all other providers involved in their care and to follow instructions provided by these respective offices. Patient advised to contact urology clinic if any urologic-pertaining questions, concerns, new symptoms or problems arise in the interim period.  There are no Patient Instructions on file for this visit.  Electronically signed by:  Donnita Falls, FNP   03/05/23    11:13 AM

## 2023-03-03 NOTE — Patient Instructions (Signed)
 Standing Labs We placed an order today for your standing lab work.   Please have your standing labs drawn in  May and every 3 months  Please have your labs drawn 2 weeks prior to your appointment so that the provider can discuss your lab results at your appointment, if possible.  Please note that you may see your imaging and lab results in MyChart before we have reviewed them. We will contact you once all results are reviewed. Please allow our office up to 72 hours to thoroughly review all of the results before contacting the office for clarification of your results.  WALK-IN LAB HOURS  Monday through Thursday from 8:00 am -12:30 pm and 1:00 pm-5:00 pm and Friday from 8:00 am-12:00 pm.  Patients with office visits requiring labs will be seen before walk-in labs.  You may encounter longer than normal wait times. Please allow additional time. Wait times may be shorter on  Monday and Thursday afternoons.  We do not book appointments for walk-in labs. We appreciate your patience and understanding with our staff.   Labs are drawn by [facility]. Please bring your co-pay at the time of your lab draw.  You may receive a bill from [facility] for your lab work.  Please note if you are on Hydroxychloroquine and and an order has been placed for a Hydroxychloroquine level,  you will need to have it drawn 4 hours or more after your last dose.  If you wish to have your labs drawn at another location, please call the office 24 hours in advance so we can fax the orders.  The office is located at [address], [address], [place], [place] [address]   If you have any questions regarding directions or hours of operation,  please call [phone].   As a reminder, please drink plenty of water prior to coming for your lab work. Thanks!   Vaccines You are taking a medication(s) that can suppress your immune system.  The following immunizations are recommended: Flu annually Covid-19  RSV Td/Tdap (tetanus,  diphtheria, pertussis) every 10 years Pneumonia (Prevnar 15 then Pneumovax 23 at least 1 year apart.  Alternatively, can take Prevnar 20 without needing additional dose) Shingrix: 2 doses from 4 weeks to 6 months apart  Please check with your PCP to make sure you are up to date.   If you have signs or symptoms of an infection or start antibiotics: First, call your PCP for workup of your infection. Hold your medication through the infection, until you complete your antibiotics, and until symptoms resolve if you take the following: Injectable medication (Actemra, Benlysta, Cimzia, Cosentyx, Enbrel, Humira, Kevzara, Orencia, Remicade, Simponi, Stelara, Taltz, Tremfya) Methotrexate Leflunomide (Arava) Mycophenolate (Cellcept) Harriette Ohara, Olumiant, or Rinvoq

## 2023-03-05 ENCOUNTER — Ambulatory Visit: Payer: Medicare PPO | Admitting: Urology

## 2023-03-05 ENCOUNTER — Encounter: Payer: Self-pay | Admitting: Urology

## 2023-03-05 VITALS — BP 141/58 | HR 53 | Temp 97.7°F

## 2023-03-05 DIAGNOSIS — N3946 Mixed incontinence: Secondary | ICD-10-CM | POA: Diagnosis not present

## 2023-03-05 DIAGNOSIS — N3281 Overactive bladder: Secondary | ICD-10-CM

## 2023-03-05 DIAGNOSIS — Z87442 Personal history of urinary calculi: Secondary | ICD-10-CM

## 2023-03-05 DIAGNOSIS — N200 Calculus of kidney: Secondary | ICD-10-CM

## 2023-03-05 DIAGNOSIS — R351 Nocturia: Secondary | ICD-10-CM | POA: Diagnosis not present

## 2023-03-05 DIAGNOSIS — R3915 Urgency of urination: Secondary | ICD-10-CM

## 2023-03-05 LAB — URINALYSIS, ROUTINE W REFLEX MICROSCOPIC
Bilirubin, UA: NEGATIVE
Glucose, UA: NEGATIVE
Ketones, UA: NEGATIVE
Leukocytes,UA: NEGATIVE
Nitrite, UA: NEGATIVE
Protein,UA: NEGATIVE
Specific Gravity, UA: <1.005 — ABNORMAL LOW (ref 1.005–1.030)
Urobilinogen, Ur: 0.2 mg/dL (ref 0.2–1.0)
pH, UA: 6 (ref 5.0–7.5)

## 2023-03-05 LAB — MICROSCOPIC EXAMINATION
Bacteria, UA: NONE SEEN
WBC, UA: NONE SEEN /HPF (ref 0–5)

## 2023-03-05 LAB — BLADDER SCAN AMB NON-IMAGING: Scan Result: 0

## 2023-03-05 MED ORDER — GEMTESA 75 MG PO TABS: 1.0000 | ORAL_TABLET | Freq: Every day | ORAL | 3 refills | Status: DC

## 2023-03-06 ENCOUNTER — Other Ambulatory Visit: Payer: Self-pay | Admitting: Physician Assistant

## 2023-03-06 ENCOUNTER — Other Ambulatory Visit: Payer: Self-pay | Admitting: Family Medicine

## 2023-03-06 DIAGNOSIS — M0579 Rheumatoid arthritis with rheumatoid factor of multiple sites without organ or systems involvement: Secondary | ICD-10-CM

## 2023-03-06 DIAGNOSIS — E1169 Type 2 diabetes mellitus with other specified complication: Secondary | ICD-10-CM

## 2023-03-06 DIAGNOSIS — I10 Essential (primary) hypertension: Secondary | ICD-10-CM

## 2023-03-06 DIAGNOSIS — E559 Vitamin D deficiency, unspecified: Secondary | ICD-10-CM

## 2023-03-06 DIAGNOSIS — E785 Hyperlipidemia, unspecified: Secondary | ICD-10-CM

## 2023-03-06 DIAGNOSIS — R7989 Other specified abnormal findings of blood chemistry: Secondary | ICD-10-CM

## 2023-03-08 ENCOUNTER — Other Ambulatory Visit: Payer: Self-pay | Admitting: Neurology

## 2023-03-08 ENCOUNTER — Other Ambulatory Visit: Payer: Self-pay | Admitting: Family Medicine

## 2023-03-08 DIAGNOSIS — J301 Allergic rhinitis due to pollen: Secondary | ICD-10-CM

## 2023-03-08 DIAGNOSIS — E785 Hyperlipidemia, unspecified: Secondary | ICD-10-CM

## 2023-03-09 DIAGNOSIS — E663 Overweight: Secondary | ICD-10-CM | POA: Diagnosis not present

## 2023-03-09 DIAGNOSIS — R7303 Prediabetes: Secondary | ICD-10-CM | POA: Diagnosis not present

## 2023-03-10 LAB — BMP8+EGFR
BUN/Creatinine Ratio: 27 (ref 12–28)
BUN: 28 mg/dL — ABNORMAL HIGH (ref 8–27)
CO2: 23 mmol/L (ref 20–29)
Calcium: 10.5 mg/dL — ABNORMAL HIGH (ref 8.7–10.3)
Chloride: 104 mmol/L (ref 96–106)
Creatinine, Ser: 1.04 mg/dL — ABNORMAL HIGH (ref 0.57–1.00)
Glucose: 92 mg/dL (ref 70–99)
Potassium: 4.2 mmol/L (ref 3.5–5.2)
Sodium: 142 mmol/L (ref 134–144)
eGFR: 51 mL/min/{1.73_m2} — ABNORMAL LOW (ref >59)

## 2023-03-10 LAB — HEMOGLOBIN A1C
Est. average glucose Bld gHb Est-mCnc: 140 mg/dL
Hgb A1c MFr Bld: 6.5 % — ABNORMAL HIGH (ref 4.8–5.6)

## 2023-03-15 ENCOUNTER — Encounter: Payer: Self-pay | Admitting: "Endocrinology

## 2023-03-15 ENCOUNTER — Ambulatory Visit (INDEPENDENT_AMBULATORY_CARE_PROVIDER_SITE_OTHER): Payer: Medicare PPO | Admitting: "Endocrinology

## 2023-03-15 VITALS — BP 152/60 | HR 52 | Ht 59.0 in | Wt 141.6 lb

## 2023-03-15 DIAGNOSIS — E782 Mixed hyperlipidemia: Secondary | ICD-10-CM

## 2023-03-15 DIAGNOSIS — E119 Type 2 diabetes mellitus without complications: Secondary | ICD-10-CM | POA: Diagnosis not present

## 2023-03-15 DIAGNOSIS — M810 Age-related osteoporosis without current pathological fracture: Secondary | ICD-10-CM | POA: Diagnosis not present

## 2023-03-15 DIAGNOSIS — E876 Hypokalemia: Secondary | ICD-10-CM

## 2023-03-15 NOTE — Patient Instructions (Signed)

## 2023-03-15 NOTE — Progress Notes (Signed)
 03/15/2023         Endocrinology follow-up note   Past Medical History:  Diagnosis Date   Acid reflux    Acute cystitis with hematuria 04/11/2011   Anemia    ARF (acute renal failure) (HCC) 12/14/2010   Arthritis    CKD (chronic kidney disease), stage II    previously seen by nephrologist--- dr Lowell Guitar,  pt released per lov in epic 09-24-2017   Diabetes mellitus, type II (HCC)    Diverticulosis of colon    Essential hypertension, benign    followed by pcp   (pt had nuclear stress test (in epic under media) dated 05-25-2008 showed normal perfusion w/ no evidence ischemia, ef 83%   Heart disease    History of bradycardia    previously seen by cardiologist, dr branch, pt released per lov 08-11-2018 in epic, bradycardia resolved   History of sepsis    04-03-2019 hospital admission w/ urosepsis due to obstructive uropathy due to right ureter stone   Hypokalemia    Lipoma of colon    ascending   Lumbar spondylosis    Mixed hyperlipidemia    Nephrolithiasis    per CT 04-03-2019 nonobstructive bilateral stones   Ocular herpes    Osteoarthritis    followed by dr Corliss Skains--- hands, shoulders, feet   Osteoporosis    Rheumatoid arthritis(714.0)    rheumotologist-- dr s. Corliss Skains---  multiple sites, treated with methotrexate/ prednisone   Right nephrolithiasis-1.4cm Moderate right-sided urinary tract obstruction/hydronephrosis 04/03/2019   Right ureteral calculus    Seasonal allergies    Sepsis (HCC) 04/03/2019   Sepsis secondary to UTI /Nephrolithiasis with obstructive uropathy 04/03/2019   SUI (stress urinary incontinence, female)    Uterine fibroid    Past Surgical History:  Procedure Laterality Date   BUNIONECTOMY Right 2012   CATARACT EXTRACTION W/ INTRAOCULAR LENS  IMPLANT, BILATERAL  2009   CHOLECYSTECTOMY  12/17/2010   Procedure: LAPAROSCOPIC CHOLECYSTECTOMY;  Surgeon: Fabio Bering;  Location: [facility];  Service:  General;  Laterality: N/A;   COLONOSCOPY  05/22/2011   Procedure: COLONOSCOPY;  Surgeon: Malissa Hippo, MD;  Location: [facility];  Service: Endoscopy;  Laterality: N/A;  1045   CYSTOSCOPY WITH RETROGRADE PYELOGRAM, URETEROSCOPY AND STENT PLACEMENT Right 05/09/2019   Procedure: CYSTOSCOPY WITH RETROGRADE PYELOGRAM, URETEROSCOPY AND STENT PLACEMENT;  Surgeon: Noel Christmas, MD;  Location: [facility];  Service: Urology;  Laterality: Right;  1 HR   CYSTOSCOPY WITH STENT PLACEMENT Right 04/03/2019   Procedure: CYSTOSCOPY, RIGHT RETROGRADE PYELOGRAM  WITH STENT PLACEMENT;  Surgeon: Noel Christmas, MD;  Location: [facility];  Service: Urology;  Laterality: Right;   EXCISION BONE CYST Right 2010   RIGHT SHOULDER   GANGLION CYST EXCISION Left 06/02/2012   Procedure: Excision ganglion left dorsal wrist;  Surgeon: Darreld Mclean, MD;  Location: [facility];  Service: Orthopedics;  Laterality: Left;   HOLMIUM LASER APPLICATION Right 05/09/2019   Procedure: HOLMIUM LASER APPLICATION;  Surgeon: Noel Christmas, MD;  Location: [facility];  Service: Urology;  Laterality: Right;   kidney stones     LUMBAR MICRODISCECTOMY  07-16-2000   @[facility]    L 2  -3   POSTERIOR LAMINECTOMY / DECOMPRESSION LUMBAR SPINE  06-26-2009   @[facility]    w/ LATERAL MASS FUSION , L3 -- 5   SHOULDER ARTHROSCOPY WITH DISTAL CLAVICLE RESECTION Right 01-10-2009   @[facility]    w/ DEBRIDEMENT AND EXCISION RECURRENT GANGLION CYST   Social History   Socioeconomic History  Marital status: Widowed    Spouse name: Not on file   Number of children: 1   Years of education: Not on file   Highest education level: Not on file  Occupational History   Occupation: Retired    Comment: [employer]  Tobacco Use   Smoking status: Never    Passive exposure: Past   Smokeless tobacco: Never  Vaping Use   Vaping status: Never Used  Substance and Sexual Activity   Alcohol use: No   Drug use: Never   Sexual activity: Not Currently     Birth control/protection: Post-menopausal  Other Topics Concern   Not on file  Social History Narrative   Husband passed away Apr 09, 2016. It has been financially harder to pay the bills since his passing, but she is making it. Has grandchildren around which makes it easier.    Social Drivers of Corporate investment banker Strain: Low Risk  (08/05/2022)   Overall Financial Resource Strain (CARDIA)    Difficulty of Paying Living Expenses: Not hard at all  Food Insecurity: No Food Insecurity (08/05/2022)   Hunger Vital Sign    Worried About Running Out of Food in the Last Year: Never true    Ran Out of Food in the Last Year: Never true  Transportation Needs: No Transportation Needs (08/05/2022)   PRAPARE - Administrator, Civil Service (Medical): No    Lack of Transportation (Non-Medical): No  Physical Activity: Insufficiently Active (08/05/2022)   Exercise Vital Sign    Days of Exercise per Week: 3 days    Minutes of Exercise per Session: 10 min  Stress: No Stress Concern Present (08/05/2022)   [employer] of Occupational Health - Occupational Stress Questionnaire    Feeling of Stress : Not at all  Social Connections: Socially Integrated (08/05/2022)   Social Connection and Isolation Panel [NHANES]    Frequency of Communication with Friends and Family: More than three times a week    Frequency of Social Gatherings with Friends and Family: More than three times a week    Attends Religious Services: More than 4 times per year    Active Member of [employer] or Organizations: Yes    Attends Banker Meetings: More than 4 times per year    Marital Status: Married   Outpatient Encounter Medications as of 03/15/2023  Medication Sig   acetaminophen (TYLENOL) 500 MG tablet Take 1,000 mg by mouth every 6 (six) hours as needed (for pain).    albuterol (VENTOLIN HFA) 108 (90 Base) MCG/ACT inhaler TAKE 2 PUFFS BY MOUTH EVERY 6 HOURS AS NEEDED FOR WHEEZE OR SHORTNESS OF  BREATH   alendronate (FOSAMAX) 70 MG tablet TAKE 1 TABLET BY MOUTH EVERY 7 DAYS. TAKE WITH A FULL GLASS OF WATER ON AN EMPTY STOMACH.   amLODipine (NORVASC) 5 MG tablet TAKE 1 TABLET BY MOUTH EVERY DAY   cetirizine (ZYRTEC) 10 MG tablet TAKE 1 TABLET BY MOUTH EVERY DAY   fluticasone (FLONASE) 50 MCG/ACT nasal spray SPRAY 2 SPRAYS INTO EACH NOSTRIL EVERY DAY   folic acid (FOLVITE) 1 MG tablet TAKE 1 TABLET BY MOUTH EVERY DAY   gabapentin (NEURONTIN) 100 MG capsule Take 1 capsule (100 mg total) by mouth at bedtime.   hydrochlorothiazide (MICROZIDE) 12.5 MG capsule TAKE 1 CAPSULE BY MOUTH EVERY DAY   methotrexate (RHEUMATREX) 2.5 MG tablet Take 3 tablets (7.5 mg total) by mouth once a week.   montelukast (SINGULAIR) 10 MG tablet TAKE 1 TABLET  BY MOUTH EVERYDAY AT BEDTIME   potassium chloride SA (KLOR-CON M20) 20 MEQ tablet TAKE 1 TABLET BY MOUTH TWICE A DAY   predniSONE (DELTASONE) 1 MG tablet TAKE 4 TABLETS (4 MG TOTAL) BY MOUTH DAILY WITH BREAKFAST   promethazine-dextromethorphan (PROMETHAZINE-DM) 6.25-15 MG/5ML syrup Take 5 mLs by mouth 4 (four) times daily as needed for cough.   Propylene Glycol (SYSTANE BALANCE OP) Place 1 drop into both eyes daily as needed (for dry eyes).    rosuvastatin (CRESTOR) 5 MG tablet TAKE 1 TABLET (5 MG TOTAL) BY MOUTH DAILY.   Vibegron (GEMTESA) 75 MG TABS Take 1 tablet (75 mg total) by mouth daily.   No facility-administered encounter medications on file as of 03/15/2023.   ALLERGIES: Allergies  Allergen Reactions   Aspirin Other (See Comments)    REACTION: burns stomach (uncoated)   Sudafed [Pseudoephedrine Hcl] Other (See Comments)    Increased joint pain    VACCINATION STATUS: Immunization History  Administered Date(s) Administered   Fluad Quad(high Dose 65+) 10/19/2019, 09/25/2020, 10/01/2021   Influenza Split 11/07/2013   Influenza Whole 10/28/2009, 09/17/2010   Influenza, High Dose Seasonal PF 09/29/2016, 09/15/2018, 10/01/2021, 09/27/2022    Influenza,inj,Quad PF,6+ Mos 09/03/2014   Influenza-Unspecified 10/05/2013, 09/29/2016   Moderna Covid-19 Fall Seasonal Vaccine 55yrs & older 10/19/2021, 09/27/2022   Moderna Covid-19 Vaccine Bivalent Booster 30yrs & up 11/01/2020   Moderna Sars-Covid-2 Vaccination 01/17/2019, 02/27/2019, 10/30/2019, 04/28/2020, 10/19/2021   PPD Test 05/14/2014, 05/28/2015   Pneumococcal Conjugate-13 08/03/2013   Pneumococcal Polysaccharide-23 08/28/2009   Tdap 04/29/2010     HPI   Stacy Meyers is 89 y.o. female who presents today for follow-up.  She was seen in consultation for osteoporosis during her last visit. PMD:  Kerri Perches, MD.  Patient was diagnosed with osteoporosis at approximate age of 32 years.   She denies fractures or falls. No dizziness/vertigo/orthostasis. -She came accompanied by her son-in-law, walking with a cane.   She has no new complaints today.  She has been on Fosamax for at least 5.5 years.  She continues to tolerate alendronate.    Review of her last 3 bone density studies show that there has been progressive improvement in her bone density hide bone density was from August 2023, confirming the same findings.   She continues to have mild hypercalcemia associated with inappropriately normal normal  - high PTH.  She denies any prior history of nephrolithiasis nor parathyroid dysfunction. -Review of her medical records indicate a course of mild hypercalcemia intermittently since 2020.    No weight bearing exercises.   No history of thyrotoxicosis, she has no new complaints today. She has normal renal function.  Menopause was at 89 y/o.   -She did not report family history of osteoporosis.  She has well-controlled type 2 diabetes.  Because of her history of rheumatoid arthritis, she is on ongoing methotrexate treatment, and intermittent steroids.  -She reports losing 1 1/2 inches of height over time. -Her medical records also show A1c in the range of type 2 diabetes  (6.5-6.7%) not currently on medications.  Review of Systems  Limited as above.  Objective:    BP (!) 152/60   Pulse (!) 52   Ht 4\' 11"  (1.499 m)   Wt 141 lb 9.6 oz (64.2 kg)   BMI 28.60 kg/m   Wt Readings from Last 3 Encounters:  03/15/23 141 lb 9.6 oz (64.2 kg)  03/03/23 140 lb 6.4 oz (63.7 kg)  02/05/23 139 lb 1.9 oz (63.1  kg)    Physical Exam  Constitutional: + normal  weight for height, not in acute distress, normal state of mind, + walks with a cane.   CMP ( most recent) CMP     Component Value Date/Time   NA 142 03/09/2023 1340   K 4.2 03/09/2023 1340   CL 104 03/09/2023 1340   CO2 23 03/09/2023 1340   GLUCOSE 92 03/09/2023 1340   GLUCOSE 130 (H) 11/06/2021 1339   BUN 28 (H) 03/09/2023 1340   CREATININE 1.04 (H) 03/09/2023 1340   CREATININE 0.96 (H) 11/06/2021 1339   CALCIUM 10.5 (H) 03/09/2023 1340   PROT 6.9 02/08/2023 1615   ALBUMIN 4.5 02/08/2023 1615   AST 19 02/08/2023 1615   ALT 9 02/08/2023 1615   ALKPHOS 61 02/08/2023 1615   BILITOT 0.3 02/08/2023 1615   GFRNONAA 60 03/12/2020 1102   GFRAA 70 03/12/2020 1102     Diabetic Labs (most recent): Lab Results  Component Value Date   HGBA1C 6.5 (H) 03/09/2023   HGBA1C 6.5 (H) 02/08/2023   HGBA1C 6.7 (H) 06/22/2022   MICROALBUR 2.7 02/01/2019   MICROALBUR 51.9 (H) 12/08/2018   MICROALBUR 12.4 (H) 01/17/2018     Lipid Panel ( most recent) Lipid Panel     Component Value Date/Time   CHOL 133 02/08/2023 1615   TRIG 83 02/08/2023 1615   HDL 66 02/08/2023 1615   CHOLHDL 2.0 02/08/2023 1615   CHOLHDL 2.6 10/24/2019 1100   VLDL 26 02/24/2016 0832   LDLCALC 51 02/08/2023 1615   LDLCALC 69 10/24/2019 1100   LABVLDL 16 02/08/2023 1615      Lab Results  Component Value Date   TSH 0.991 02/08/2023   TSH 1.390 03/13/2021   TSH 2.560 02/02/2020   TSH 0.98 10/24/2019   TSH 1.95 01/13/2018   TSH 2.354 01/24/2015   TSH 1.782 07/31/2013   TSH 0.915 04/16/2011   TSH 0.490 12/15/2010   TSH  1.426 03/03/2010   FREET4 1.29 02/02/2020   FREET4 0.97 12/15/2010    Latest Reference Range & Units 01/22/22 16:32 03/06/22 10:07  Glucose 70 - 99 mg/dL  161 (H)  Hemoglobin W9U 4.8 - 5.6 % 6.2 (H)   Est. average glucose Bld gHb Est-mCnc mg/dL 045   PTH, Intact 15 - 65 pg/mL  45  PTH Interp   Comment  (H): Data is abnormally high  Bone density study from August 06, 2021 was reviewed. Assessment: 1. Osteoporosis  2.  Hypokalemia  3.  Hypercalcemia  4.  Hyperlipidemia  Plan: 1. Osteoporosis - likely postmenopausal    Review of her last 3 bone density studies showed that she is benefiting from treatment with alendronate a T score improving on her radius.     We reviewed her DEXA scans together, and I explained that based on the T scores, she is responding to her alendronate.  She continues to tolerate her alendronate 70 mg p.o. weekly.  She is advised to continue on same until next bone density in August 2025.     At that point, she may be considered for drug holiday if she maintains or improves her bone density.  Her serum calcium remains mildly elevated, likely indicative of mild hyperparathyroidism.  She is advised to avoid calcium supplement.   She will continue to benefit from low-dose vitamin D .     She will have repeat calcium/ PTH before her next visit.  -Hypokalemia: Corrected to potassium of 4.2 mEq/L.  She is benefiting from  her low-dose potassium supplement, advised to continue potassium chloride 20 mg twice a day.  -I discussed with her all fall precautions and avoiding vigorous movements to reduce her risk of fracture.  She is encouraged to continue to use her cane to ambulate.  - we discussed about maintaining a good amount of protein in her diet. She will continue to benefit from her statin intervention.  She is advised to continue Crestor 5 mg p.o. nightly.  Regarding her type 2 diabetes, will not need medication coverage as diabetic she is advised to minimize  consumption of processed foods.  - she acknowledges that there is a room for improvement in her food and drink choices. - Suggestion is made for her to avoid simple carbohydrates  from her diet including Cakes, Sweet Desserts, Ice Cream, Soda (diet and regular), Sweet Tea, Candies, Chips, Cookies, Store Bought Juices, Alcohol , Artificial Sweeteners,  Coffee Creamer, and "Sugar-free" Products, Lemonade. This will help patient to have more stable blood glucose profile and potentially avoid unintended weight gain.   - I advised patient to maintain close follow up with Kerri Perches, MD for primary care needs.   I spent  25  minutes in the care of the patient today including review of labs from Thyroid Function, CMP, and other relevant labs ; imaging/biopsy records (current and previous including abstractions from other facilities); face-to-face time discussing  her lab results and symptoms, medications doses, her options of short and long term treatment based on the latest standards of care / guidelines;   and documenting the encounter.  Sherol Dade  participated in the discussions, expressed understanding, and voiced agreement with the above plans.  All questions were answered to her satisfaction. she is encouraged to contact clinic should she have any questions or concerns prior to her return visit.    Follow up plan: Return in about 6 months (around 09/15/2023) for F/U with Pre-visit Labs, A1c -NV, DXA Scan B4 NV.   Marquis Lunch, MD [facility] [address] [place], [place] [address] Phone: [phone]  Fax: [phone]     03/15/2023, 12:45 PM  This note was partially dictated with voice recognition software. Similar sounding words can be transcribed inadequately or may not  be corrected upon review.

## 2023-03-21 ENCOUNTER — Other Ambulatory Visit: Payer: Self-pay | Admitting: Rheumatology

## 2023-03-21 DIAGNOSIS — M0579 Rheumatoid arthritis with rheumatoid factor of multiple sites without organ or systems involvement: Secondary | ICD-10-CM

## 2023-04-20 ENCOUNTER — Encounter: Payer: Self-pay | Admitting: Internal Medicine

## 2023-04-20 ENCOUNTER — Ambulatory Visit (INDEPENDENT_AMBULATORY_CARE_PROVIDER_SITE_OTHER): Admitting: Internal Medicine

## 2023-04-20 VITALS — BP 172/77 | HR 82 | Wt 142.4 lb

## 2023-04-20 DIAGNOSIS — M06332 Rheumatoid nodule, left wrist: Secondary | ICD-10-CM | POA: Diagnosis not present

## 2023-04-20 NOTE — Progress Notes (Signed)
   Acute Office Visit  Subjective:     Patient ID: Stacy Meyers, female    DOB: 31-Aug-1932, 89 y.o.   MRN: [id number]  Chief Complaint  Patient presents with   Cyst    Knot on left wrist ,not painful, appeared three days ago    Stacy Meyers presents today for an acute visit for evaluation of a nontender knot on her left wrist.  She first noticed the knot 3 days ago.  Denies recent trauma or impaired function of the wrist since the appearance of the knot.  She has a known history of RA and OA overlap with synovial thickening over both wrists and MCP joints of both hands.  She is followed by rheumatology, last seen 2/26 for follow-up and is prescribed methotrexate.  Review of Systems  Musculoskeletal:        Painless knot on left wrist      Objective:    BP (!) 172/77   Pulse 82   Wt 142 lb 6.4 oz (64.6 kg)   SpO2 94%   BMI 28.76 kg/m   Physical Exam Musculoskeletal:     Comments: There is a firm, nontender mass along the dorsal aspect of the left wrist at the radiocarpal joint.  Bilateral ulnar deviation of the wrists noted.       Assessment & Plan:   Problem List Items Addressed This Visit       Rheumatoid nodule of left wrist (HCC) - Primary   Presenting today for evaluation of a painless knot along the dorsal aspect of the left wrist at the radiocarpal junction.  The appearance of this lesion in the context of her medical history is most consistent with a rheumatoid nodule.  Treatment options reviewed.  We discussed that in the absence of discomfort there is no indication for treatment currently.  She will continue to monitor the lesion for resolution and follow-up with her PCP or rheumatologist if it increases in size or becomes painful.  Otherwise, she is scheduled for routine follow-up with her PCP on 8/1.      Return if symptoms worsen or fail to improve.  Tobi Fortes, MD

## 2023-04-20 NOTE — Patient Instructions (Signed)
 It was a pleasure to see you today.  Thank you for giving us  the opportunity to be involved in your care.  Below is a brief recap of your visit and next steps.  We will plan to see you again on 08/06/23.  Summary The nodule on your wrist appears most consistent with a rheumatoid nodule. No treatment is indicated for now in the absence of discomfort. Follow up as scheduled with Dr. Rodolph Clap on 08/06/23.

## 2023-04-20 NOTE — Assessment & Plan Note (Signed)
 Presenting today for evaluation of a painless knot along the dorsal aspect of the left wrist at the radiocarpal junction.  The appearance of this lesion in the context of her medical history is most consistent with a rheumatoid nodule.  Treatment options reviewed.  We discussed that in the absence of discomfort there is no indication for treatment currently.  She will continue to monitor the lesion for resolution and follow-up with her PCP or rheumatologist if it increases in size or becomes painful.  Otherwise, she is scheduled for routine follow-up with her PCP on 8/1.

## 2023-04-23 ENCOUNTER — Other Ambulatory Visit: Payer: Self-pay | Admitting: Physician Assistant

## 2023-04-23 NOTE — Telephone Encounter (Signed)
 Last Fill: 01/19/2023  Next Visit: 08/02/2023  Last Visit: 03/03/2023  DX: Rheumatoid arthritis with rheumatoid factor of multiple sites without organ or systems involvement   Current Dose per office note on 03/03/2023: prednisone  1 mg 4 tablets daily.   Okay to refill prednisone ?

## 2023-06-08 ENCOUNTER — Other Ambulatory Visit: Payer: Self-pay | Admitting: Neurology

## 2023-06-08 ENCOUNTER — Other Ambulatory Visit: Payer: Self-pay | Admitting: "Endocrinology

## 2023-06-08 ENCOUNTER — Other Ambulatory Visit: Payer: Self-pay | Admitting: Family Medicine

## 2023-06-08 ENCOUNTER — Other Ambulatory Visit: Payer: Self-pay | Admitting: Rheumatology

## 2023-06-08 DIAGNOSIS — E785 Hyperlipidemia, unspecified: Secondary | ICD-10-CM

## 2023-06-08 DIAGNOSIS — E1169 Type 2 diabetes mellitus with other specified complication: Secondary | ICD-10-CM

## 2023-06-08 DIAGNOSIS — M0579 Rheumatoid arthritis with rheumatoid factor of multiple sites without organ or systems involvement: Secondary | ICD-10-CM

## 2023-06-08 DIAGNOSIS — I10 Essential (primary) hypertension: Secondary | ICD-10-CM

## 2023-06-08 DIAGNOSIS — E559 Vitamin D deficiency, unspecified: Secondary | ICD-10-CM

## 2023-06-08 DIAGNOSIS — R7989 Other specified abnormal findings of blood chemistry: Secondary | ICD-10-CM

## 2023-06-09 ENCOUNTER — Other Ambulatory Visit: Payer: Self-pay | Admitting: *Deleted

## 2023-06-09 DIAGNOSIS — Z79899 Other long term (current) drug therapy: Secondary | ICD-10-CM

## 2023-06-09 NOTE — Telephone Encounter (Signed)
 Last Fill: 03/03/23  Labs: 02/08/2023  CBC WNL Glucose 165 eGFR 55 Calcium  10.7  LMOM for patient to update labs   Next Visit: 08/02/2023  Last Visit: 03/03/2023  DX: Rheumatoid arthritis with rheumatoid factor of multiple sites without organ or systems involvement   Current Dose per office note 03/03/2023: Methotrexate  3 tablets by mouth every 7 days   Okay to refill Methotrexate ?

## 2023-06-10 DIAGNOSIS — Z79899 Other long term (current) drug therapy: Secondary | ICD-10-CM | POA: Diagnosis not present

## 2023-06-12 ENCOUNTER — Ambulatory Visit: Payer: Self-pay | Admitting: Rheumatology

## 2023-06-12 LAB — CMP14+EGFR
ALT: 8 IU/L (ref 0–32)
AST: 17 IU/L (ref 0–40)
Albumin: 4.2 g/dL (ref 3.7–4.7)
Alkaline Phosphatase: 61 IU/L (ref 44–121)
BUN/Creatinine Ratio: 25 (ref 12–28)
BUN: 24 mg/dL (ref 8–27)
Bilirubin Total: 0.5 mg/dL (ref 0.0–1.2)
CO2: 22 mmol/L (ref 20–29)
Calcium: 10.2 mg/dL (ref 8.7–10.3)
Chloride: 104 mmol/L (ref 96–106)
Creatinine, Ser: 0.96 mg/dL (ref 0.57–1.00)
Globulin, Total: 2.2 g/dL (ref 1.5–4.5)
Glucose: 89 mg/dL (ref 70–99)
Potassium: 3.7 mmol/L (ref 3.5–5.2)
Sodium: 142 mmol/L (ref 134–144)
Total Protein: 6.4 g/dL (ref 6.0–8.5)
eGFR: 57 mL/min/{1.73_m2} — ABNORMAL LOW (ref >59)

## 2023-06-12 LAB — CBC WITH DIFFERENTIAL/PLATELET
Basophils Absolute: 0.1 10*3/uL (ref 0.0–0.2)
Basos: 1 %
EOS (ABSOLUTE): 0.1 10*3/uL (ref 0.0–0.4)
Eos: 2 %
Hematocrit: 36.9 % (ref 34.0–46.6)
Hemoglobin: 12 g/dL (ref 11.1–15.9)
Immature Grans (Abs): 0 10*3/uL (ref 0.0–0.1)
Immature Granulocytes: 0 %
Lymphocytes Absolute: 2.2 10*3/uL (ref 0.7–3.1)
Lymphs: 41 %
MCH: 30.1 pg (ref 26.6–33.0)
MCHC: 32.5 g/dL (ref 31.5–35.7)
MCV: 93 fL (ref 79–97)
Monocytes Absolute: 0.4 10*3/uL (ref 0.1–0.9)
Monocytes: 8 %
Neutrophils Absolute: 2.5 10*3/uL (ref 1.4–7.0)
Neutrophils: 48 %
Platelets: 243 10*3/uL (ref 150–450)
RBC: 3.99 x10E6/uL (ref 3.77–5.28)
RDW: 13.8 % (ref 11.7–15.4)
WBC: 5.3 10*3/uL (ref 3.4–10.8)

## 2023-06-12 NOTE — Progress Notes (Signed)
 CBC and CMP are stable.

## 2023-06-28 ENCOUNTER — Other Ambulatory Visit: Payer: Self-pay | Admitting: Physician Assistant

## 2023-06-28 ENCOUNTER — Telehealth: Payer: Self-pay | Admitting: Neurology

## 2023-06-28 DIAGNOSIS — M0579 Rheumatoid arthritis with rheumatoid factor of multiple sites without organ or systems involvement: Secondary | ICD-10-CM

## 2023-06-28 NOTE — Telephone Encounter (Signed)
 Last Fill: 06/09/2023 (30 day supply)  Labs: 06/10/2023 CBC and CMP are stable.   Next Visit: 08/02/2023  Last Visit: 03/03/2023  DX: Rheumatoid arthritis with rheumatoid factor of multiple sites without organ or systems involvement   Current Dose per office note 03/03/2023: Methotrexate  3 tablets by mouth every 7 days   Okay to refill Methotrexate ?

## 2023-07-11 ENCOUNTER — Other Ambulatory Visit: Payer: Self-pay | Admitting: Physician Assistant

## 2023-07-11 DIAGNOSIS — M0579 Rheumatoid arthritis with rheumatoid factor of multiple sites without organ or systems involvement: Secondary | ICD-10-CM

## 2023-07-12 MED ORDER — PREDNISONE 1 MG PO TABS: ORAL_TABLET | ORAL | 0 refills | Status: DC

## 2023-07-12 NOTE — Telephone Encounter (Signed)
 Last Fill: 04/23/2023  Next Visit: 08/02/2023  Last Visit: 03/03/2023  Dx: Rheumatoid arthritis with rheumatoid factor of multiple sites without organ or systems involvement   Current Dose per office note on 03/03/2023: prednisone  1 mg 4 tablets daily.   Okay to refill Prednisone ?

## 2023-07-13 NOTE — Telephone Encounter (Signed)
 Pt is needing this filled as soon as possible due to her being out already for 3 days. Please advise.

## 2023-07-13 NOTE — Telephone Encounter (Signed)
 refilled

## 2023-07-19 NOTE — Progress Notes (Unsigned)
 Office Visit Note  Patient: Stacy Meyers             Date of Birth: [birth year]           MRN: [id number]             PCP: Antonetta Rollene BRAVO, MD Referring: Antonetta Rollene BRAVO, MD Visit Date: 08/02/2023 Occupation: @GUAROCC @  Subjective:  Medication monitoring  History of Present Illness: Stacy Meyers is a 89 y.o. female with history of seropositive rheumatoid arthritis.  The patient was accompanied by her granddaughter during the office visit today.  Patient remains on  Methotrexate  3 tablets by mouth every 7 days, folic acid  1 mg daily, and prednisone  1 mg 4 tablets daily.  She continues to tolerate methotrexate  and prednisone  without any side effects and has not had any recent gaps in therapy.  She denies any signs or symptoms of a rheumatoid arthritis flare.  She has been experiencing increased discomfort in the buttocks region bilaterally.  She has been sitting for prolonged periods of time while at home which may be exacerbating her symptoms.  She has been using a rollator walker for support.  She denies any recent falls. She denies any recent or recurrent infections.     Activities of Daily Living:  Patient denies any morning stiffness  Patient Reports nocturnal pain.  Difficulty dressing/grooming: Denies Difficulty climbing stairs: Denies Difficulty getting out of chair: Denies Difficulty using hands for taps, buttons, cutlery, and/or writing: Denies  Review of Systems  Constitutional:  Negative for fatigue.  HENT:  Positive for mouth dryness. Negative for mouth sores.   Eyes:  Positive for dryness.  Respiratory:  Negative for shortness of breath.   Cardiovascular:  Negative for chest pain and palpitations.  Gastrointestinal:  Negative for blood in stool, constipation and diarrhea.  Endocrine: Negative for increased urination.  Genitourinary:  Negative for involuntary urination.  Musculoskeletal:  Positive for joint pain, gait problem, joint pain, joint swelling,  myalgias and myalgias. Negative for muscle weakness, morning stiffness and muscle tenderness.  Skin:  Negative for color change, rash, hair loss and sensitivity to sunlight.  Allergic/Immunologic: Negative for susceptible to infections.  Neurological:  Negative for dizziness and headaches.  Hematological:  Negative for swollen glands.  Psychiatric/Behavioral:  Negative for depressed mood and sleep disturbance. The patient is not nervous/anxious.     PMFS History:  Patient Active Problem List   Diagnosis Date Noted  . Rheumatoid nodule of left wrist (HCC) 04/20/2023  . OAB (overactive bladder) 03/03/2023  . Mixed stress and urge urinary incontinence 03/03/2023  . Kidney stones 03/03/2023  . Renal cyst 03/03/2023  . History of recurrent UTIs 03/03/2023  . Fall 12/07/2022  . Seasonal allergic rhinitis due to pollen 05/27/2022  . Reduced vision 01/22/2022  . New onset of headaches 01/22/2022  . Age related osteoporosis 02/07/2021  . Mass of left lower leg 10/24/2020  . Headache 08/26/2020  . Prediabetes 10/28/2019  . H/o Chronic heart failure with preserved ejection fraction (HFpEF) (HCC) 04/03/2019  . Umbilical hernia without obstruction and without gangrene 12/10/2018  . Fibroids 05/08/2016  . Spondylosis of lumbar region without myelopathy or radiculopathy 12/24/2015  . Solitary bone cyst of right shoulder 05/28/2015  . Right knee pain 05/28/2015  . Osteoarthritis of both shoulders 05/28/2015  . Overweight (BMI 25.0-29.9) 04/28/2014  . Ganglion cyst of wrist 05/11/2012  . Systolic murmur 04/16/2011  . Diastolic dysfunction 12/15/2010  . Hypokalemia 12/14/2010  . Hypercalcemia 12/14/2010  .  Seasonal allergies 08/28/2010  . Herpes simplex virus (HSV) infection 09/16/2008  . Rheumatoid arthritis with rheumatoid factor of multiple sites without organ or systems involvement (HCC) 09/16/2008  . Mixed hyperlipidemia 05/23/2008  . Essential hypertension, benign 05/23/2008    Past  Medical History:  Diagnosis Date  . Acid reflux   . Acute cystitis with hematuria 04/11/2011  . Anemia   . ARF (acute renal failure) (HCC) 12/14/2010  . Arthritis   . CKD (chronic kidney disease), stage II    previously seen by nephrologist--- dr perri,  pt released per lov in epic 09-24-2017  . Diabetes mellitus, type II (HCC)   . Diverticulosis of colon   . Essential hypertension, benign    followed by pcp   (pt had nuclear stress test (in epic under media) dated 05-25-2008 showed normal perfusion w/ no evidence ischemia, ef 83%  . Heart disease   . History of bradycardia    previously seen by cardiologist, dr branch, pt released per lov 08-11-2018 in epic, bradycardia resolved  . History of sepsis    04-03-2019 hospital admission w/ urosepsis due to obstructive uropathy due to right ureter stone  . Hypokalemia   . Lipoma of colon    ascending  . Lumbar spondylosis   . Mixed hyperlipidemia   . Nephrolithiasis    per CT 04-03-2019 nonobstructive bilateral stones  . Ocular herpes   . Osteoarthritis    followed by dr deveshwar--- hands, shoulders, feet  . Osteoporosis   . Rheumatoid arthritis(714.0)    rheumotologist-- dr s. dolphus---  multiple sites, treated with methotrexate / prednisone   . Right nephrolithiasis-1.4cm Moderate right-sided urinary tract obstruction/hydronephrosis 04/03/2019  . Right ureteral calculus   . Seasonal allergies   . Sepsis (HCC) 04/03/2019  . Sepsis secondary to UTI /Nephrolithiasis with obstructive uropathy 04/03/2019  . SUI (stress urinary incontinence, female)   . Uterine fibroid     Family History  Problem Relation Age of Onset  . Stroke Mother   . Cancer Father   . Diabetes Sister   . Diabetes Sister   . Kidney failure Brother   . Kidney failure Brother   . Pancreatic cancer Sister   . Kidney Stones Sister        Only one Kidney  . Arthritis Sister   . Heart disease Daughter    Past Surgical History:  Procedure Laterality Date  .  BUNIONECTOMY Right 2012  . CATARACT EXTRACTION W/ INTRAOCULAR LENS  IMPLANT, BILATERAL  2009  . CHOLECYSTECTOMY  12/17/2010   Procedure: LAPAROSCOPIC CHOLECYSTECTOMY;  Surgeon: Brent C Ziegler;  Location: [facility];  Service: General;  Laterality: N/A;  . COLONOSCOPY  05/22/2011   Procedure: COLONOSCOPY;  Surgeon: Claudis RAYMOND Rivet, MD;  Location: [facility];  Service: Endoscopy;  Laterality: N/A;  1045  . CYSTOSCOPY WITH RETROGRADE PYELOGRAM, URETEROSCOPY AND STENT PLACEMENT Right 05/09/2019   Procedure: CYSTOSCOPY WITH RETROGRADE PYELOGRAM, URETEROSCOPY AND STENT PLACEMENT;  Surgeon: Elisabeth Valli BIRCH, MD;  Location: [facility];  Service: Urology;  Laterality: Right;  1 HR  . CYSTOSCOPY WITH STENT PLACEMENT Right 04/03/2019   Procedure: CYSTOSCOPY, RIGHT RETROGRADE PYELOGRAM  WITH STENT PLACEMENT;  Surgeon: Elisabeth Valli BIRCH, MD;  Location: [facility];  Service: Urology;  Laterality: Right;  . EXCISION BONE CYST Right 2010   RIGHT SHOULDER  . GANGLION CYST EXCISION Left 06/02/2012   Procedure: Excision ganglion left dorsal wrist;  Surgeon: Lemond Stable, MD;  Location: [facility];  Service: Orthopedics;  Laterality: Left;  .  HOLMIUM LASER APPLICATION Right 05/09/2019   Procedure: HOLMIUM LASER APPLICATION;  Surgeon: Elisabeth Valli BIRCH, MD;  Location: [facility];  Service: Urology;  Laterality: Right;  . kidney stones    . LUMBAR MICRODISCECTOMY  07-16-2000   @[facility]    L 2  -3  . POSTERIOR LAMINECTOMY / DECOMPRESSION LUMBAR SPINE  06-26-2009   @[facility]    w/ LATERAL MASS FUSION , L3 -- 5  . SHOULDER ARTHROSCOPY WITH DISTAL CLAVICLE RESECTION Right 01-10-2009   @[facility]    w/ DEBRIDEMENT AND EXCISION RECURRENT GANGLION CYST   Social History   Social History Narrative   Husband passed away 04-22-16. It has been financially harder to pay the bills since his passing, but she is making it. Has grandchildren around which makes it easier.    Immunization History  Administered Date(s)  Administered  . Fluad Quad(high Dose 65+) 10/19/2019, 09/25/2020, 10/01/2021  . Influenza Split 11/07/2013  . Influenza Whole 10/28/2009, 09/17/2010  . Influenza, High Dose Seasonal PF 09/29/2016, 09/15/2018, 10/01/2021, 09/27/2022  . Influenza,inj,Quad PF,6+ Mos 09/03/2014  . Influenza-Unspecified 10/05/2013, 09/29/2016  . Moderna Covid-19 Fall Seasonal Vaccine 7yrs & older 10/19/2021, 09/27/2022  . Moderna Covid-19 Vaccine Bivalent Booster 41yrs & up 11/01/2020  . Moderna Sars-Covid-2 Vaccination 01/17/2019, 02/27/2019, 10/30/2019, 04/28/2020, 10/19/2021  . PPD Test 05/14/2014, 05/28/2015  . Pneumococcal Conjugate-13 08/03/2013  . Pneumococcal Polysaccharide-23 08/28/2009  . Tdap 04/29/2010     Objective: Vital Signs: BP (!) 149/68 (BP Location: Left Arm, Patient Position: Sitting, Cuff Size: Normal)   Pulse (!) 54   Resp 18   Ht 4' 11 (1.499 m)   Wt 140 lb (63.5 kg)   BMI 28.28 kg/m    Physical Exam Vitals and nursing note reviewed.  Constitutional:      Appearance: She is well-developed.  HENT:     Head: Normocephalic and atraumatic.  Eyes:     Conjunctiva/sclera: Conjunctivae normal.  Cardiovascular:     Rate and Rhythm: Normal rate and regular rhythm.     Heart sounds: Normal heart sounds.  Pulmonary:     Effort: Pulmonary effort is normal.     Breath sounds: Normal breath sounds.  Abdominal:     General: Bowel sounds are normal.     Palpations: Abdomen is soft.  Musculoskeletal:     Cervical back: Normal range of motion.  Lymphadenopathy:     Cervical: No cervical adenopathy.  Skin:    General: Skin is warm and dry.     Capillary Refill: Capillary refill takes less than 2 seconds.  Neurological:     Mental Status: She is alert and oriented to person, place, and time.  Psychiatric:        Behavior: Behavior normal.      Musculoskeletal Exam: C-spine has limited range of motion with lateral rotation.  Thoracic kyphosis noted.  Shoulder abduction limited  to about 30 degrees bilaterally.  Elbow joints have good range of motion with no tenderness along the joint line.  Cyst versus nodule on the radial aspect of the left wrist.  No tenderness or synovitis over MCP joints.  Ulnar deviation noted.  Knee joints have good ROM with no warmth or effusion.  Ankle joints have good ROM with no tenderness.  Pedal edema in bilateral LE.  No evidence of achilles tendonitis or plantar fasciitis.   CDAI Exam: CDAI Score: -- Patient Global: --; Provider Global: -- Swollen: --; Tender: -- Joint Exam 08/02/2023   No joint exam has been documented for  this visit   There is currently no information documented on the homunculus. Go to the Rheumatology activity and complete the homunculus joint exam.  Investigation: No additional findings.  Imaging: No results found.  Recent Labs: Lab Results  Component Value Date   WBC 6.3 07/30/2023   HGB 11.8 07/30/2023   PLT 268 07/30/2023   NA 143 07/30/2023   K 3.7 07/30/2023   CL 104 07/30/2023   CO2 21 07/30/2023   GLUCOSE 91 07/30/2023   BUN 28 (H) 07/30/2023   CREATININE 1.00 07/30/2023   BILITOT 0.5 07/30/2023   ALKPHOS 69 07/30/2023   AST 13 07/30/2023   ALT 6 07/30/2023   PROT 6.3 07/30/2023   ALBUMIN  4.3 07/30/2023   CALCIUM  10.3 07/30/2023   GFRAA 70 03/12/2020    Speciality Comments: No specialty comments available.  Procedures:  No procedures performed Allergies: Aspirin  and Sudafed [pseudoephedrine hcl]    Assessment / Plan:     Visit Diagnoses: Rheumatoid arthritis with rheumatoid factor of multiple sites without organ or systems involvement (HCC) - longstanding history of rheumatoid arthritis: She has no synovitis on examination today.  She has been doing well on low-dose methotrexate  3 tablets by mouth once weekly and folic acid  1 mg daily.  She also remains on prednisone  4 mg daily and has been unable to taper.  She has no active inflammation on examination today.  Patient has a  possible cyst versus nodule on the radial aspect of the left wrist which is nontender and has not changed in size over the past 2 months.  No extensor tenosynovitis was noted at this time.  Overall her symptoms have been manageable on the current treatment regimen.  No medication changes will be made at this time.  She was advised to notify us  if she develops any new or worsening symptoms. She will follow-up in the office in 5 months or sooner if needed.  High risk medication use - Methotrexate  3 tablets by mouth every 7 days, folic acid  1 mg daily, and prednisone  1 mg 4 tablets daily. CBC and CMP updated on 07/30/23.  Her next lab work will be due at the end of October and every 3 months to monitor for drug toxicity. Hgb A1C 6.5% on 03/09/23.  Lipid panel updated on 02/08/23.  No recent or recurrent infections.  Discussed the importance of holding methotrexate  if she develops signs or symptoms of an infection and to resume once the infection has completely cleared.  Primary osteoarthritis of both hands: Ulnar deviation noted.  Cyst vs. Nodule on the radial aspect of the left wrist.  No synovitis or extensor tenosynovitis noted.  Primary osteoarthritis of both shoulders: Severely limited mobility-limited abduction to about 30 degrees.   Primary osteoarthritis of both knees: She has good range of motion of both knee joints on examination today.  No warmth or effusion noted.  Using a rollator walker to assist with ambulation.  Primary osteoarthritis of both feet: She is not experiencing any increased discomfort in her feet at this time.  She is wearing proper fitting shoes.  Pedal edema noted bilateral lower extremities.  Ischial bursitis of right side: She has tenderness over the ischial bursa bilaterally.  She has difficulty sitting for prolonged periods of time due to the discomfort.  Offered a referral to PT. Discussed the importance of using a cushion for support while seated.   Ischial bursitis of  left side: She has tenderness over the ischial bursa bilaterally.  Different treatment options  were discussed.    Spondylosis of lumbar region without myelopathy or radiculopathy: Limited mobility.  Using a walker to assist with ambulation.    Age-related osteoporosis without current pathological fracture - DEXA 08/06/21:radius 33% 0.569 T-score -2.0.DEXA 07/31/19 Dr. Simpson:Left femur Neck:T-score -2.0, BMD 0.764.  The patient and her granddaughter unsure if she has been taking Fosamax  on a weekly basis.  She has upcoming appointment with her PCP on Friday and plans to further discuss at that time. Due to update DEXA in August 2025.  Other medical conditions are listed as follows:   Type 2 diabetes, diet controlled (HCC)  Essential hypertension: Blood pressure was elevated today in the office and was rechecked prior to leaving.  Patient was advised to monitor blood pressure closely and to reach out to PCP if remains elevated.  Systolic murmur  Orders: No orders of the defined types were placed in this encounter.  No orders of the defined types were placed in this encounter.    Follow-Up Instructions: Return in about 5 months (around 01/02/2024) for Rheumatoid arthritis.   Waddell CHRISTELLA Craze, PA-C  Note - This record has been created using Dragon software.  Chart creation errors have been sought, but may not always  have been located. Such creation errors do not reflect on  the standard of medical care.

## 2023-07-20 ENCOUNTER — Other Ambulatory Visit: Payer: Self-pay | Admitting: Family Medicine

## 2023-07-30 DIAGNOSIS — Z79899 Other long term (current) drug therapy: Secondary | ICD-10-CM | POA: Diagnosis not present

## 2023-07-31 ENCOUNTER — Ambulatory Visit: Payer: Self-pay | Admitting: Rheumatology

## 2023-07-31 LAB — CMP14+EGFR
ALT: 6 IU/L (ref 0–32)
AST: 13 IU/L (ref 0–40)
Albumin: 4.3 g/dL (ref 3.7–4.7)
Alkaline Phosphatase: 69 IU/L (ref 44–121)
BUN/Creatinine Ratio: 28 (ref 12–28)
BUN: 28 mg/dL — ABNORMAL HIGH (ref 8–27)
Bilirubin Total: 0.5 mg/dL (ref 0.0–1.2)
CO2: 21 mmol/L (ref 20–29)
Calcium: 10.3 mg/dL (ref 8.7–10.3)
Chloride: 104 mmol/L (ref 96–106)
Creatinine, Ser: 1 mg/dL (ref 0.57–1.00)
Globulin, Total: 2 g/dL (ref 1.5–4.5)
Glucose: 91 mg/dL (ref 70–99)
Potassium: 3.7 mmol/L (ref 3.5–5.2)
Sodium: 143 mmol/L (ref 134–144)
Total Protein: 6.3 g/dL (ref 6.0–8.5)
eGFR: 54 mL/min/1.73 — ABNORMAL LOW (ref >59)

## 2023-07-31 LAB — CBC WITH DIFFERENTIAL/PLATELET
Basophils Absolute: 0 x10E3/uL (ref 0.0–0.2)
Basos: 1 %
EOS (ABSOLUTE): 0.1 x10E3/uL (ref 0.0–0.4)
Eos: 2 %
Hematocrit: 37.8 % (ref 34.0–46.6)
Hemoglobin: 11.8 g/dL (ref 11.1–15.9)
Immature Grans (Abs): 0 x10E3/uL (ref 0.0–0.1)
Immature Granulocytes: 0 %
Lymphocytes Absolute: 2.2 x10E3/uL (ref 0.7–3.1)
Lymphs: 34 %
MCH: 29.7 pg (ref 26.6–33.0)
MCHC: 31.2 g/dL — ABNORMAL LOW (ref 31.5–35.7)
MCV: 95 fL (ref 79–97)
Monocytes Absolute: 0.5 x10E3/uL (ref 0.1–0.9)
Monocytes: 8 %
Neutrophils Absolute: 3.5 x10E3/uL (ref 1.4–7.0)
Neutrophils: 55 %
Platelets: 268 x10E3/uL (ref 150–450)
RBC: 3.97 x10E6/uL (ref 3.77–5.28)
RDW: 14.3 % (ref 11.7–15.4)
WBC: 6.3 x10E3/uL (ref 3.4–10.8)

## 2023-07-31 NOTE — Progress Notes (Signed)
 CBC and CMP are stable.  Creatinine remains low.  Please forward results to her PCP.

## 2023-08-02 ENCOUNTER — Ambulatory Visit: Payer: Medicare PPO | Attending: Physician Assistant | Admitting: Physician Assistant

## 2023-08-02 ENCOUNTER — Encounter: Payer: Self-pay | Admitting: Physician Assistant

## 2023-08-02 VITALS — BP 149/68 | HR 54 | Resp 18 | Ht 59.0 in | Wt 140.0 lb

## 2023-08-02 DIAGNOSIS — M19011 Primary osteoarthritis, right shoulder: Secondary | ICD-10-CM

## 2023-08-02 DIAGNOSIS — M19041 Primary osteoarthritis, right hand: Secondary | ICD-10-CM

## 2023-08-02 DIAGNOSIS — M0579 Rheumatoid arthritis with rheumatoid factor of multiple sites without organ or systems involvement: Secondary | ICD-10-CM

## 2023-08-02 DIAGNOSIS — M19042 Primary osteoarthritis, left hand: Secondary | ICD-10-CM

## 2023-08-02 DIAGNOSIS — Z79899 Other long term (current) drug therapy: Secondary | ICD-10-CM

## 2023-08-02 DIAGNOSIS — M19072 Primary osteoarthritis, left ankle and foot: Secondary | ICD-10-CM

## 2023-08-02 DIAGNOSIS — M47816 Spondylosis without myelopathy or radiculopathy, lumbar region: Secondary | ICD-10-CM

## 2023-08-02 DIAGNOSIS — M7071 Other bursitis of hip, right hip: Secondary | ICD-10-CM

## 2023-08-02 DIAGNOSIS — I10 Essential (primary) hypertension: Secondary | ICD-10-CM

## 2023-08-02 DIAGNOSIS — E119 Type 2 diabetes mellitus without complications: Secondary | ICD-10-CM

## 2023-08-02 DIAGNOSIS — M170 Bilateral primary osteoarthritis of knee: Secondary | ICD-10-CM | POA: Diagnosis not present

## 2023-08-02 DIAGNOSIS — R011 Cardiac murmur, unspecified: Secondary | ICD-10-CM

## 2023-08-02 DIAGNOSIS — M810 Age-related osteoporosis without current pathological fracture: Secondary | ICD-10-CM | POA: Diagnosis not present

## 2023-08-02 DIAGNOSIS — M19071 Primary osteoarthritis, right ankle and foot: Secondary | ICD-10-CM | POA: Diagnosis not present

## 2023-08-02 DIAGNOSIS — M19012 Primary osteoarthritis, left shoulder: Secondary | ICD-10-CM

## 2023-08-02 DIAGNOSIS — M7072 Other bursitis of hip, left hip: Secondary | ICD-10-CM

## 2023-08-02 NOTE — Patient Instructions (Addendum)
 Standing Labs We placed an order today for your standing lab work.   Please have your standing labs drawn at the end of October and every 3 months Please have your labs drawn 2 weeks prior to your appointment so that the provider can discuss your lab results at your appointment, if possible.  Please note that you may see your imaging and lab results in MyChart before we have reviewed them. We will contact you once all results are reviewed. Please allow our office up to 72 hours to thoroughly review all of the results before contacting the office for clarification of your results.  WALK-IN LAB HOURS  Monday through Thursday from 8:00 am -12:30 pm and 1:00 pm-4:30 pm and Friday from 8:00 am-12:00 pm.  Patients with office visits requiring labs will be seen before walk-in labs.  You may encounter longer than normal wait times. Please allow additional time. Wait times may be shorter on  Monday and Thursday afternoons.  We do not book appointments for walk-in labs. We appreciate your patience and understanding with our staff.   Labs are drawn by [facility]. Please bring your co-pay at the time of your lab draw.  You may receive a bill from [facility] for your lab work.  Please note if you are on Hydroxychloroquine  and and an order has been placed for a Hydroxychloroquine  level,  you will need to have it drawn 4 hours or more after your last dose.  If you wish to have your labs drawn at another location, please call the office 24 hours in advance so we can fax the orders.  The office is located at [address], [place], [place] [address]   If you have any questions regarding directions or hours of operation,  please call [phone].   As a reminder, please drink plenty of water prior to coming for your lab work. Thanks!

## 2023-08-06 ENCOUNTER — Other Ambulatory Visit (HOSPITAL_COMMUNITY): Payer: Self-pay | Admitting: Family Medicine

## 2023-08-06 ENCOUNTER — Ambulatory Visit (INDEPENDENT_AMBULATORY_CARE_PROVIDER_SITE_OTHER): Payer: Medicare PPO | Admitting: Family Medicine

## 2023-08-06 ENCOUNTER — Encounter: Payer: Self-pay | Admitting: Family Medicine

## 2023-08-06 VITALS — BP 130/72 | HR 57 | Ht 59.0 in | Wt 139.2 lb

## 2023-08-06 DIAGNOSIS — K429 Umbilical hernia without obstruction or gangrene: Secondary | ICD-10-CM

## 2023-08-06 DIAGNOSIS — E782 Mixed hyperlipidemia: Secondary | ICD-10-CM | POA: Diagnosis not present

## 2023-08-06 DIAGNOSIS — I10 Essential (primary) hypertension: Secondary | ICD-10-CM | POA: Diagnosis not present

## 2023-08-06 DIAGNOSIS — Z1231 Encounter for screening mammogram for malignant neoplasm of breast: Secondary | ICD-10-CM

## 2023-08-06 DIAGNOSIS — Z87440 Personal history of urinary (tract) infections: Secondary | ICD-10-CM | POA: Diagnosis not present

## 2023-08-06 DIAGNOSIS — M8580 Other specified disorders of bone density and structure, unspecified site: Secondary | ICD-10-CM | POA: Diagnosis not present

## 2023-08-06 DIAGNOSIS — M67431 Ganglion, right wrist: Secondary | ICD-10-CM

## 2023-08-06 DIAGNOSIS — N6489 Other specified disorders of breast: Secondary | ICD-10-CM

## 2023-08-06 DIAGNOSIS — E1169 Type 2 diabetes mellitus with other specified complication: Secondary | ICD-10-CM | POA: Diagnosis not present

## 2023-08-06 DIAGNOSIS — M0579 Rheumatoid arthritis with rheumatoid factor of multiple sites without organ or systems involvement: Secondary | ICD-10-CM

## 2023-08-06 MED ORDER — ALENDRONATE SODIUM 70 MG PO TABS: 70.0000 mg | ORAL_TABLET | ORAL | 11 refills | Status: AC

## 2023-08-06 NOTE — Patient Instructions (Signed)
 F/U in November   Pls schedule mammogram at checkout  hBA1C today  Once weekly alendronate  for bones has been prescribed  Keep appt with Dr Lenis please  Thanks for choosing [facility], we consider it a privelige to serve you.

## 2023-08-08 ENCOUNTER — Other Ambulatory Visit: Payer: Self-pay | Admitting: "Endocrinology

## 2023-08-08 ENCOUNTER — Encounter: Payer: Self-pay | Admitting: Family Medicine

## 2023-08-08 ENCOUNTER — Other Ambulatory Visit: Payer: Self-pay | Admitting: Physician Assistant

## 2023-08-08 DIAGNOSIS — M8580 Other specified disorders of bone density and structure, unspecified site: Secondary | ICD-10-CM | POA: Insufficient documentation

## 2023-08-08 DIAGNOSIS — E1169 Type 2 diabetes mellitus with other specified complication: Secondary | ICD-10-CM | POA: Insufficient documentation

## 2023-08-08 DIAGNOSIS — M0579 Rheumatoid arthritis with rheumatoid factor of multiple sites without organ or systems involvement: Secondary | ICD-10-CM

## 2023-08-08 DIAGNOSIS — M810 Age-related osteoporosis without current pathological fracture: Secondary | ICD-10-CM | POA: Insufficient documentation

## 2023-08-08 NOTE — Assessment & Plan Note (Signed)
 Controlled, no change in medication

## 2023-08-08 NOTE — Assessment & Plan Note (Signed)
 Currently asymptomatic, frequent voiding and adequate water  intake encouraged

## 2023-08-08 NOTE — Assessment & Plan Note (Signed)
 Diabetes associated with hypertension, hyperlipidemia, and arthritis  [name] is reminded of the importance of commitment to daily physical activity for 30 minutes or more, as able and the need to limit carbohydrate intake to 30 to 60 grams per meal to help with blood sugar control.     [name] is reminded of the importance of daily foot exam, annual eye examination, and good blood sugar, blood pressure and cholesterol control.     Latest Ref Rng & Units 07/30/2023   11:34 AM 06/10/2023    1:52 PM 03/09/2023    1:40 PM 02/08/2023    4:15 PM 09/01/2022   11:13 AM  Diabetic Labs  HbA1c 4.8 - 5.6 %   6.5  6.5    Chol 100 - 199 mg/dL    866  860   HDL >60 mg/dL    66  71   Calc LDL 0 - 99 mg/dL    51  51   Triglycerides 0 - 149 mg/dL    83  88   Creatinine 0.57 - 1.00 mg/dL 8.99  9.03  8.95  9.00  0.93       08/06/2023    3:05 PM 08/06/2023    2:42 PM 08/02/2023   10:16 AM 08/02/2023    9:38 AM 04/20/2023    2:09 PM 03/15/2023   10:23 AM 03/05/2023   10:46 AM  BP/Weight  Systolic BP 130 175 149 149 172 152 141  Diastolic BP 72 70 68 62 77 60 58  Wt. (Lbs)  139.25  140 142.4 141.6   BMI  28.13 kg/m2  28.28 kg/m2 28.76 kg/m2 28.6 kg/m2       Latest Ref Rng & Units 11/22/2019   12:00 AM 05/08/2019    8:00 AM  Foot/eye exam completion dates  Eye Exam No Retinopathy No Retinopathy       Foot Form Completion   Done     This result is from an external source.

## 2023-08-08 NOTE — Progress Notes (Signed)
 Stacy Meyers     MRN: [id number]      DOB: 1932-09-03  No chief complaint on file.   HPI Stacy Meyers is here for follow up and re-evaluation of chronic medical conditions, medication management and review of any available recent lab and radiology data.  Preventive health is updated, specifically  Cancer screening and Immunization.   Questions or concerns regarding consultations or procedures which the PT has had in the interim are  addressed. The PT denies any adverse reactions to current medications since the last visit.  C/o cyst on right wrist painless C/o abdominal hernia painless   ROS Denies recent fever or chills. Denies sinus pressure, nasal congestion, ear pain or sore throat. Denies chest congestion, productive cough or wheezing. Denies chest pains, palpitations and leg swelling Denies abdominal pain, nausea, vomiting,diarrhea or constipation.   Denies dysuria, frequency, hesitancy or incontinence. Chronic  joint pain, swelling and limitation in mobility. Denies headaches, seizures, numbness, or tingling. Denies depression, anxiety or insomnia. Denies skin break down or rash.   PE  BP 130/72   Pulse (!) 57   Ht 4' 11 (1.499 m)   Wt 139 lb 4 oz (63.2 kg)   BMI 28.13 kg/m   Patient alert and oriented and in no cardiopulmonary distress.  HEENT: No facial asymmetry, EOMI,     Neck decreased ROM.  Chest: Clear to auscultation bilaterally.  CVS: S1, S2 ystolic  murmur, no S3.Regular rate.  ABD: Soft non tender.   Ext: No edema  MS: Adequate  though reduced ROM spine, shoulders, hips and knees.  Skin: Intact, no ulcerations or rash noted.  Psych: Good eye contact, normal affect.  not anxious or depressed appearing.  CNS: CN 2-12 intact, power,  normal throughout.no focal deficits noted.   Assessment & Plan  Essential hypertension, benign Controlled, no change in medication   Umbilical hernia without obstruction and without gangrene No pain reported ,  just gets concerned due to large size at times, again reviewed the appropriate management which is observation  Rheumatoid arthritis with rheumatoid factor of multiple sites without organ or systems involvement (HCC) Controlled  on current meds and managed by Rheumatology  History of recurrent UTIs Currently asymptomatic, frequent voiding and adequate water  intake encouraged  Ganglion cyst of wrist Persistent and painless, right wrist  Mixed hyperlipidemia Hyperlipidemia:Low fat diet discussed and encouraged.   Lipid Panel  Lab Results  Component Value Date   CHOL 133 02/08/2023   HDL 66 02/08/2023   LDLCALC 51 02/08/2023   TRIG 83 02/08/2023   CHOLHDL 2.0 02/08/2023       Type 2 diabetes mellitus with other specified complication (HCC) Diabetes associated with hypertension, hyperlipidemia, and arthritis  Stacy Meyers is reminded of the importance of commitment to daily physical activity for 30 minutes or more, as able and the need to limit carbohydrate intake to 30 to 60 grams per meal to help with blood sugar control.     Stacy Meyers is reminded of the importance of daily foot exam, annual eye examination, and good blood sugar, blood pressure and cholesterol control.     Latest Ref Rng & Units 07/30/2023   11:34 AM 06/10/2023    1:52 PM 03/09/2023    1:40 PM 02/08/2023    4:15 PM 09/01/2022   11:13 AM  Diabetic Labs  HbA1c 4.8 - 5.6 %   6.5  6.5    Chol 100 - 199 mg/dL    866  860  HDL >39 mg/dL    66  71   Calc LDL 0 - 99 mg/dL    51  51   Triglycerides 0 - 149 mg/dL    83  88   Creatinine 0.57 - 1.00 mg/dL 8.99  9.03  8.95  9.00  0.93       08/06/2023    3:05 PM 08/06/2023    2:42 PM 08/02/2023   10:16 AM 08/02/2023    9:38 AM 04/20/2023    2:09 PM 03/15/2023   10:23 AM 03/05/2023   10:46 AM  BP/Weight  Systolic BP 130 175 149 149 172 152 141  Diastolic BP 72 70 68 62 77 60 58  Wt. (Lbs)  139.25  140 142.4 141.6   BMI  28.13 kg/m2  28.28 kg/m2 28.76 kg/m2 28.6 kg/m2        Latest Ref Rng & Units 11/22/2019   12:00 AM 05/08/2019    8:00 AM  Foot/eye exam completion dates  Eye Exam No Retinopathy No Retinopathy       Foot Form Completion   Done     This result is from an external source.        Osteopenia Pt on chronic steroids, followed by Endo, has uocoming appt and rept dexa, fosamax  refilled

## 2023-08-08 NOTE — Assessment & Plan Note (Signed)
 Hyperlipidemia:Low fat diet discussed and encouraged.   Lipid Panel  Lab Results  Component Value Date   CHOL 133 02/08/2023   HDL 66 02/08/2023   LDLCALC 51 02/08/2023   TRIG 83 02/08/2023   CHOLHDL 2.0 02/08/2023

## 2023-08-08 NOTE — Assessment & Plan Note (Signed)
 No pain reported , just gets concerned due to large size at times, again reviewed the appropriate management which is observation

## 2023-08-08 NOTE — Assessment & Plan Note (Signed)
 Controlled  on current meds and managed by Rheumatology

## 2023-08-08 NOTE — Assessment & Plan Note (Signed)
 Pt on chronic steroids, followed by Endo, has uocoming appt and rept dexa, fosamax  refilled

## 2023-08-08 NOTE — Assessment & Plan Note (Signed)
 Persistent and painless, right wrist

## 2023-08-09 ENCOUNTER — Other Ambulatory Visit: Payer: Self-pay

## 2023-08-09 ENCOUNTER — Ambulatory Visit (INDEPENDENT_AMBULATORY_CARE_PROVIDER_SITE_OTHER): Payer: Medicare PPO

## 2023-08-09 VITALS — Ht 59.0 in | Wt 138.0 lb

## 2023-08-09 DIAGNOSIS — Z0001 Encounter for general adult medical examination with abnormal findings: Secondary | ICD-10-CM

## 2023-08-09 DIAGNOSIS — Z0000 Encounter for general adult medical examination without abnormal findings: Secondary | ICD-10-CM

## 2023-08-09 DIAGNOSIS — H9193 Unspecified hearing loss, bilateral: Secondary | ICD-10-CM

## 2023-08-09 DIAGNOSIS — E1169 Type 2 diabetes mellitus with other specified complication: Secondary | ICD-10-CM

## 2023-08-09 DIAGNOSIS — E782 Mixed hyperlipidemia: Secondary | ICD-10-CM

## 2023-08-09 NOTE — Patient Instructions (Addendum)
 Stacy Meyers , Thank you for taking time out of your busy schedule to complete your Annual Wellness Visit with me. I enjoyed our conversation and look forward to speaking with you again next year. I, as well as your care team,  appreciate your ongoing commitment to your health goals. Please review the following plan we discussed and let me know if I can assist you in the future. Your Game plan/ To Do List    Referrals: If you haven't heard from the office you've been referred to, please reach out to them at the phone provided.  ENT: [facility] [address] [place] [address] Ph: [phone]  Follow up Visits: We will see or speak with you next year for your Next Medicare AWV with our clinical staff  Clinician Recommendations:  Aim for 30 minutes of exercise or brisk walking, 6-8 glasses of water , and 5 servings of fruits and vegetables each day.    Wishing you many blessings and good health during the next year until our next visit.  -Clelia Trabucco   This is a list of the screenings recommended for you:  Health Maintenance  Topic Date Due   DTaP/Tdap/Td vaccine (2 - Td or Tdap) 04/28/2020   Complete foot exam   05/07/2020   Eye exam for diabetics  11/21/2020   COVID-19 Vaccine (8 - Moderna risk 2024-25 season) 03/27/2023   DEXA scan (bone density measurement)  08/07/2023   Flu Shot  08/06/2023   Mammogram  08/26/2023   Hemoglobin A1C  09/09/2023   Medicare Annual Wellness Visit  08/08/2024   Pneumococcal Vaccine for age over 32  Completed   Hepatitis B Vaccine  Aged Out   HPV Vaccine  Aged Out   Meningitis B Vaccine  Aged Out   Zoster (Shingles) Vaccine  Discontinued    Advanced directives: (Provided) Advance directive discussed with you today. I have provided a copy for you to complete at home and have notarized. Once this is complete, please bring a copy in to our office so we can scan it into your chart.  Advance Care Planning is important  because it:  [x]  Makes sure you receive the medical care that is consistent with your values, goals, and preferences  [x]  It provides guidance to your family and loved ones and reduces their decisional burden about whether or not they are making the right decisions based on your wishes.  Follow the link provided in your after visit summary or read over the paperwork we have mailed to you to help you started getting your Advance Directives in place. If you need assistance in completing these, please reach out to us  so that we can help you!  See attachments for Preventive Care and Fall Prevention Tips.

## 2023-08-09 NOTE — Progress Notes (Signed)
 Subjective:   Stacy Meyers is a 89 y.o. who presents for a Medicare Wellness preventive visit.  As a reminder, Annual Wellness Visits don't include a physical exam, and some assessments may be limited, especially if this visit is performed virtually. We may recommend an in-person follow-up visit with your provider if needed.  Visit Complete: Virtual I connected with  Stacy Meyers on 08/09/23 by a audio enabled telemedicine application and verified that I am speaking with the correct person using two identifiers.  Patient Location: Home  Provider Location: Home Office  I discussed the limitations of evaluation and management by telemedicine. The patient expressed understanding and agreed to proceed.  Vital Signs: Because this visit was a virtual/telehealth visit, some criteria may be missing or patient reported. Any vitals not documented were not able to be obtained and vitals that have been documented are patient reported.  VideoDeclined- This patient declined Librarian, academic. Therefore the visit was completed with audio only.  Persons Participating in Visit: Patient.  AWV Questionnaire: No: Patient Medicare AWV questionnaire was not completed prior to this visit.  Cardiac Risk Factors include: advanced age (>66men, >89 women);diabetes mellitus;dyslipidemia;hypertension;sedentary lifestyle     Objective:    Today's Vitals   08/09/23 1509  Weight: 138 lb (62.6 kg)  Height: 4' 11 (1.499 m)   Body mass index is 27.87 kg/m.     08/09/2023    3:07 PM 08/05/2022    1:00 PM 07/18/2021    2:46 PM 07/17/2020    2:06 PM 07/17/2019    9:53 AM 05/09/2019    9:31 AM 04/03/2019   12:17 PM  Advanced Directives  Does Patient Have a Medical Advance Directive? No No No Yes Yes No Yes  Type of Advance Directive    Living will Healthcare Power of [employer] of [place];Living will  Does patient want to make changes to medical advance directive?     No - Patient declined   No - Patient declined  Copy of Healthcare Power of Attorney in Chart?     No - copy requested  No - copy requested  Would patient like information on creating a medical advance directive? Yes (MAU/Ambulatory/Procedural Areas - Information given) No - Patient declined Yes (ED - Information included in AVS)   No - Patient declined     Current Medications (verified) Outpatient Encounter Medications as of 08/09/2023  Medication Sig   acetaminophen  (TYLENOL ) 500 MG tablet Take 1,000 mg by mouth every 6 (six) hours as needed (for pain).    albuterol  (VENTOLIN  HFA) 108 (90 Base) MCG/ACT inhaler TAKE 2 PUFFS BY MOUTH EVERY 6 HOURS AS NEEDED FOR WHEEZE OR SHORTNESS OF BREATH (Patient taking differently: as needed.)   alendronate  (FOSAMAX ) 70 MG tablet TAKE 1 TABLET BY MOUTH EVERY 7 DAYS. TAKE WITH A FULL GLASS OF WATER  ON AN EMPTY STOMACH.   alendronate  (FOSAMAX ) 70 MG tablet Take 1 tablet (70 mg total) by mouth every 7 (seven) days. Take with a full glass of water  on an empty stomach.   amLODipine  (NORVASC ) 5 MG tablet TAKE 1 TABLET BY MOUTH EVERY DAY   aspirin  EC 81 MG tablet Take 81 mg by mouth daily. Swallow whole.   cetirizine  (ZYRTEC ) 10 MG tablet TAKE 1 TABLET BY MOUTH EVERY DAY   fluticasone  (FLONASE ) 50 MCG/ACT nasal spray SPRAY 2 SPRAYS INTO EACH NOSTRIL EVERY DAY   folic acid  (FOLVITE ) 1 MG tablet TAKE 1 TABLET BY MOUTH EVERY DAY  gabapentin  (NEURONTIN ) 100 MG capsule Take 1 capsule (100 mg total) by mouth at bedtime.   hydrochlorothiazide  (MICROZIDE ) 12.5 MG capsule TAKE 1 CAPSULE BY MOUTH EVERY DAY   methotrexate  (RHEUMATREX) 2.5 MG tablet TAKE 3 TABLETS BY MOUTH ONE TIME PER WEEK   montelukast  (SINGULAIR ) 10 MG tablet TAKE 1 TABLET BY MOUTH EVERYDAY AT BEDTIME   potassium chloride  SA (KLOR-CON  M) 20 MEQ tablet TAKE 1 TABLET BY MOUTH TWICE A DAY   predniSONE  (DELTASONE ) 1 MG tablet TAKE 4 TABLETS (4 MG TOTAL) BY MOUTH DAILY WITH BREAKFAST   Propylene Glycol (SYSTANE  BALANCE OP) Place 1 drop into both eyes daily as needed (for dry eyes).    rosuvastatin  (CRESTOR ) 5 MG tablet TAKE 1 TABLET (5 MG TOTAL) BY MOUTH DAILY.   Vibegron  (GEMTESA ) 75 MG TABS Take 1 tablet (75 mg total) by mouth daily.   No facility-administered encounter medications on file as of 08/09/2023.    Allergies (verified) Aspirin  and Sudafed [pseudoephedrine hcl]   History: Past Medical History:  Diagnosis Date   Acid reflux    Acute cystitis with hematuria 04/11/2011   Anemia    ARF (acute renal failure) (HCC) 12/14/2010   Arthritis    CKD (chronic kidney disease), stage II    previously seen by nephrologist--- dr perri,  pt released per lov in epic 09-24-2017   Diabetes mellitus, type II (HCC)    Diverticulosis of colon    Essential hypertension, benign    followed by pcp   (pt had nuclear stress test (in epic under media) dated 05-25-2008 showed normal perfusion w/ no evidence ischemia, ef 83%   Heart disease    History of bradycardia    previously seen by cardiologist, dr branch, pt released per lov 08-11-2018 in epic, bradycardia resolved   History of sepsis    04-03-2019 hospital admission w/ urosepsis due to obstructive uropathy due to right ureter stone   Hypokalemia    Lipoma of colon    ascending   Lumbar spondylosis    Mixed hyperlipidemia    Nephrolithiasis    per CT 04-03-2019 nonobstructive bilateral stones   Ocular herpes    Osteoarthritis    followed by dr dolphus--- hands, shoulders, feet   Osteoporosis    Rheumatoid arthritis(714.0)    rheumotologist-- dr s. dolphus---  multiple sites, treated with methotrexate / prednisone    Right nephrolithiasis-1.4cm Moderate right-sided urinary tract obstruction/hydronephrosis 04/03/2019   Right ureteral calculus    Seasonal allergies    Sepsis (HCC) 04/03/2019   Sepsis secondary to UTI /Nephrolithiasis with obstructive uropathy 04/03/2019   SUI (stress urinary incontinence, female)    Uterine fibroid    Past  Surgical History:  Procedure Laterality Date   BUNIONECTOMY Right 2012   CATARACT EXTRACTION W/ INTRAOCULAR LENS  IMPLANT, BILATERAL  2009   CHOLECYSTECTOMY  12/17/2010   Procedure: LAPAROSCOPIC CHOLECYSTECTOMY;  Surgeon: Thresa JAYSON Pulling;  Location: [facility];  Service: General;  Laterality: N/A;   COLONOSCOPY  05/22/2011   Procedure: COLONOSCOPY;  Surgeon: Claudis RAYMOND Rivet, MD;  Location: [facility];  Service: Endoscopy;  Laterality: N/A;  1045   CYSTOSCOPY WITH RETROGRADE PYELOGRAM, URETEROSCOPY AND STENT PLACEMENT Right 05/09/2019   Procedure: CYSTOSCOPY WITH RETROGRADE PYELOGRAM, URETEROSCOPY AND STENT PLACEMENT;  Surgeon: Elisabeth Valli BIRCH, MD;  Location: [facility];  Service: Urology;  Laterality: Right;  1 HR   CYSTOSCOPY WITH STENT PLACEMENT Right 04/03/2019   Procedure: CYSTOSCOPY, RIGHT RETROGRADE PYELOGRAM  WITH STENT PLACEMENT;  Surgeon: Elisabeth,  Valli BIRCH, MD;  Location: [facility];  Service: Urology;  Laterality: Right;   EXCISION BONE CYST Right 2010   RIGHT SHOULDER   GANGLION CYST EXCISION Left 06/02/2012   Procedure: Excision ganglion left dorsal wrist;  Surgeon: Lemond Stable, MD;  Location: [facility];  Service: Orthopedics;  Laterality: Left;   HOLMIUM LASER APPLICATION Right 05/09/2019   Procedure: HOLMIUM LASER APPLICATION;  Surgeon: Elisabeth Valli BIRCH, MD;  Location[facility];  Service: Urology;  Laterality: Right;   kidney stones     LUMBAR MICRODISCECTOMY  07-16-2000   @[facility]    L 2  -3   POSTERIOR LAMINECTOMY / DECOMPRESSION LUMBAR SPINE  06-26-2009   @[facility]    w/ LATERAL MASS FUSION , L3 -- 5   SHOULDER ARTHROSCOPY WITH DISTAL CLAVICLE RESECTION Right 01-10-2009   @[facility]    w/ DEBRIDEMENT AND EXCISION RECURRENT GANGLION CYST   Family History  Problem Relation Age of Onset   Stroke Mother    Cancer Father    Diabetes Sister    Diabetes Sister    Kidney failure Brother    Kidney failure Brother    Pancreatic cancer Sister    Kidney Stones Sister         Only one Kidney   Arthritis Sister    Heart disease Daughter    Social History   Socioeconomic History   Marital status: Widowed    Spouse name: Not on file   Number of children: 1   Years of education: Not on file   Highest education level: Not on file  Occupational History   Occupation: Retired    Comment: [employer]  Tobacco Use   Smoking status: Never    Passive exposure: Past   Smokeless tobacco: Never  Vaping Use   Vaping status: Never Used  Substance and Sexual Activity   Alcohol use: No   Drug use: Never   Sexual activity: Not Currently    Birth control/protection: Post-menopausal  Other Topics Concern   Not on file  Social History Narrative   Husband passed away 04-15-2016. It has been financially harder to pay the bills since his passing, but she is making it. Has grandchildren around which makes it easier.    Social Drivers of Corporate investment banker Strain: Low Risk  (08/09/2023)   Overall Financial Resource Strain (CARDIA)    Difficulty of Paying Living Expenses: Not hard at all  Food Insecurity: No Food Insecurity (08/09/2023)   Hunger Vital Sign    Worried About Running Out of Food in the Last Year: Never true    Ran Out of Food in the Last Year: Never true  Transportation Needs: No Transportation Needs (08/09/2023)   PRAPARE - Administrator, Civil Service (Medical): No    Lack of Transportation (Non-Medical): No  Physical Activity: Insufficiently Active (08/09/2023)   Exercise Vital Sign    Days of Exercise per Week: 3 days    Minutes of Exercise per Session: 10 min  Stress: No Stress Concern Present (08/09/2023)   [employer] of Occupational Health - Occupational Stress Questionnaire    Feeling of Stress: Not at all  Social Connections: Moderately Integrated (08/09/2023)   Social Connection and Isolation Panel    Frequency of Communication with Friends and Family: More than three times a week    Frequency of Social Gatherings with  Friends and Family: More than three times a week    Attends Religious Services: More than 4 times per  year    Active Member of Clubs or Organizations: Yes    Attends Banker Meetings: More than 4 times per year    Marital Status: Widowed    Tobacco Counseling Counseling given: Yes    Clinical Intake:  Pre-visit preparation completed: Yes  Pain : No/denies pain     BMI - recorded: 27.87 Nutritional Risks: None Diabetes: Yes CBG done?: No (telehealth visit.) Did pt. bring in CBG monitor from home?: No  Lab Results  Component Value Date   HGBA1C 6.5 (H) 03/09/2023   HGBA1C 6.5 (H) 02/08/2023   HGBA1C 6.7 (H) 06/22/2022     How often do you need to have someone help you when you read instructions, pamphlets, or other written materials from your doctor or pharmacy?: 1 - Never  Interpreter Needed?: No  Information entered by :: Ramond Darnell W CMA AAMA   Activities of Daily Living     08/09/2023    3:15 PM  In your present state of health, do you have any difficulty performing the following activities:  Hearing? 1  Vision? 0  Difficulty concentrating or making decisions? 0  Walking or climbing stairs? 0  Dressing or bathing? 0  Doing errands, shopping? 0  Preparing Food and eating ? N  Using the Toilet? N  In the past six months, have you accidently leaked urine? N  Do you have problems with loss of bowel control? N  Managing your Medications? N  Comment grand daughter checks behind her.  Managing your Finances? N  Housekeeping or managing your Housekeeping? N    Patient Care Team: Antonetta Rollene BRAVO, MD as PCP - General Dolphus Reiter, MD as Consulting Physician (Rheumatology) Jacinto Lonni PARAS, [facility] (Inactive) (Pharmacist) Aura Mliss LABOR, RN as [facility], Dorn FALCON, MD as Consulting Physician (Cardiology) Darroll Anes, DO (Optometry) Janit Thresa HERO, DPM as Consulting Physician (Podiatry) Lenis Ethelle ORN, MD as Consulting Physician (Endocrinology) Gerldine Lauraine BROCKS, FNP (Urology) Watt Rush, MD as Attending Physician (Urology)  I have updated your Care Teams any recent Medical Services you may have received from other providers in the past year.     Assessment:   This is a routine wellness examination for Stacy Meyers.  Hearing/Vision screen Hearing Screening - Comments:: Patient complains of difficulty with hearing. ENT referral placed. Patient is in agreement with treatment plan. Aware that the office will call with an appointment.   Vision Screening - Comments:: Wears rx glasses - up to date with routine eye exams with  Anes Darroll w/ [facility]   Goals Addressed             This Visit's Progress    Patient Stated       Patient stated I want to have a successful year.        Depression Screen     08/09/2023    3:24 PM 02/05/2023    2:48 PM 08/05/2022    1:08 PM 06/18/2022    3:52 PM 01/22/2022    3:53 PM 05/27/2021   12:49 PM 05/26/2021    9:20 AM  PHQ 2/9 Scores  PHQ - 2 Score 0 0 0 3 4 0 0  PHQ- 9 Score 0  0 4 5      Fall Risk     08/09/2023    3:13 PM 02/05/2023    2:48 PM 12/07/2022   11:18 AM 06/18/2022    3:52 PM 05/27/2022    2:21  PM  Fall Risk   Falls in the past year? 0 0 1 0 0  Number falls in past yr: 0 0 1 0 0  Injury with Fall? 0 0  0 0  Risk for fall due to : Impaired balance/gait;Impaired mobility No Fall Risks Impaired mobility;Impaired balance/gait No Fall Risks   Follow up Falls prevention discussed;Education provided;Falls evaluation completed Falls evaluation completed Follow up appointment Falls evaluation completed     MEDICARE RISK AT HOME:  Medicare Risk at Home Any stairs in or around the home?: Yes If so, are there any without handrails?: No Home free of loose throw rugs in walkways, pet beds, electrical cords, etc?: Yes Adequate lighting in your home to reduce risk of falls?: Yes Life alert?: No Use of a cane,  walker or w/c?: Yes Grab bars in the bathroom?: Yes Shower chair or bench in shower?: Yes Elevated toilet seat or a handicapped toilet?: Yes  TIMED UP AND GO:  Was the test performed?  No  Cognitive Function: 6CIT completed    07/17/2020    2:08 PM 05/01/2020   10:44 AM  MMSE - Mini Mental State Exam  Not completed: Unable to complete   Orientation to time  5  Orientation to Place  5  Registration  3  Attention/ Calculation  5  Recall  2  Language- name 2 objects  2  Language- repeat  1  Language- follow 3 step command  3  Language- read & follow direction  1  Write a sentence  1  Copy design  1  Total score  29        08/09/2023    3:18 PM 08/05/2022    1:05 PM 07/18/2021    2:31 PM 07/17/2020    2:09 PM 07/17/2019    9:56 AM  6CIT Screen  What Year? 0 points 0 points 0 points  0 points  What month? 0 points 0 points 0 points  0 points  What time? 0 points 0 points 0 points 0 points 0 points  Count back from 20 0 points 0 points 0 points 0 points 0 points  Months in reverse 0 points 2 points 2 points 2 points 2 points  Repeat phrase 0 points 0 points 0 points 2 points 0 points  Total Score 0 points 2 points 2 points  2 points    Immunizations Immunization History  Administered Date(s) Administered   Fluad Quad(high Dose 65+) 10/19/2019, 09/25/2020, 10/01/2021   Influenza Split 11/07/2013   Influenza Whole 10/28/2009, 09/17/2010   Influenza, High Dose Seasonal PF 09/29/2016, 09/15/2018, 10/01/2021, 09/27/2022   Influenza,inj,Quad PF,6+ Mos 09/03/2014   Influenza-Unspecified 10/05/2013, 09/29/2016   Moderna Covid-19 Fall Seasonal Vaccine 42yrs & older 10/19/2021, 09/27/2022   Moderna Covid-19 Vaccine Bivalent Booster 67yrs & up 11/01/2020   Moderna Sars-Covid-2 Vaccination 01/17/2019, 02/27/2019, 10/30/2019, 04/28/2020, 10/19/2021   PPD Test 05/14/2014, 05/28/2015   Pneumococcal Conjugate-13 08/03/2013   Pneumococcal Polysaccharide-23 08/28/2009   Tdap 04/29/2010     Screening Tests Health Maintenance  Topic Date Due   DTaP/Tdap/Td (2 - Td or Tdap) 04/28/2020   FOOT EXAM  05/07/2020   OPHTHALMOLOGY EXAM  11/21/2020   COVID-19 Vaccine (8 - Moderna risk 2024-25 season) 03/27/2023   DEXA SCAN  08/07/2023   INFLUENZA VACCINE  08/06/2023   MAMMOGRAM  08/26/2023   HEMOGLOBIN A1C  09/09/2023   Medicare Annual Wellness (AWV)  08/08/2024   Pneumococcal Vaccine: 50+ Years  Completed   Hepatitis B Vaccines  Aged Out   HPV VACCINES  Aged Out   Meningococcal B Vaccine  Aged Out   Zoster Vaccines- Shingrix  Discontinued    Health Maintenance  Health Maintenance Due  Topic Date Due   DTaP/Tdap/Td (2 - Td or Tdap) 04/28/2020   FOOT EXAM  05/07/2020   OPHTHALMOLOGY EXAM  11/21/2020   COVID-19 Vaccine (8 - Moderna risk 2024-25 season) 03/27/2023   DEXA SCAN  08/07/2023   INFLUENZA VACCINE  08/06/2023   Health Maintenance Items Addressed: Patient has an appt with podiatry on Aug 16, 2023 for diabetic foot care. She is aware she needs to call and schedule her diabetic eye exam. Will let her grand daughter know so that can be done. Mammogram and bone density are scheduled for September.   Additional Screening:  Vision Screening: Recommended annual ophthalmology exams for early detection of glaucoma and other disorders of the eye. Would you like a referral to an eye doctor? No    Dental Screening: Recommended annual dental exams for proper oral hygiene  Community Resource Referral / Chronic Care Management: CRR required this visit?  No   CCM required this visit?  No   Plan:    I have personally reviewed and noted the following in the patient's chart:   Medical and social history Use of alcohol, tobacco or illicit drugs  Current medications and supplements including opioid prescriptions. Patient is not currently taking opioid prescriptions. Functional ability and status Nutritional status Physical activity Advanced directives List of  other physicians Hospitalizations, surgeries, and ER visits in previous 12 months Vitals Screenings to include cognitive, depression, and falls Referrals and appointments  In addition, I have reviewed and discussed with patient certain preventive protocols, quality metrics, and best practice recommendations. A written personalized care plan for preventive services as well as general preventive health recommendations were provided to patient.   Temekia Caskey, CMA   08/09/2023   After Visit Summary: (MyChart) Due to this being a telephonic visit, the after visit summary with patients personalized plan was offered to patient via MyChart   Notes: Nothing significant to report at this time.

## 2023-08-09 NOTE — Addendum Note (Signed)
 Addended by: ALLYSON PROFFER A on: 08/09/2023 04:28 PM   Modules accepted: Orders

## 2023-08-16 ENCOUNTER — Ambulatory Visit: Admitting: Podiatry

## 2023-08-23 ENCOUNTER — Ambulatory Visit: Admitting: Podiatry

## 2023-08-25 ENCOUNTER — Encounter: Payer: Self-pay | Admitting: Podiatry

## 2023-08-25 ENCOUNTER — Ambulatory Visit (INDEPENDENT_AMBULATORY_CARE_PROVIDER_SITE_OTHER): Admitting: Podiatry

## 2023-08-25 VITALS — Ht 59.0 in | Wt 138.0 lb

## 2023-08-25 DIAGNOSIS — B351 Tinea unguium: Secondary | ICD-10-CM | POA: Diagnosis not present

## 2023-08-25 DIAGNOSIS — M79674 Pain in right toe(s): Secondary | ICD-10-CM

## 2023-08-25 DIAGNOSIS — M79675 Pain in left toe(s): Secondary | ICD-10-CM

## 2023-08-25 NOTE — Progress Notes (Signed)
 Chief Complaint  Patient presents with   Nail Problem    Pt is here for [facility].    SUBJECTIVE Patient with a history of diabetes mellitus presents to office today complaining of elongated, thickened nails that cause pain while ambulating in shoes.  Patient is unable to trim their own nails. Patient is here for further evaluation and treatment.  Past Medical History:  Diagnosis Date   Acid reflux    Acute cystitis with hematuria 04/11/2011   Anemia    ARF (acute renal failure) (HCC) 12/14/2010   Arthritis    CKD (chronic kidney disease), stage II    previously seen by nephrologist--- dr perri,  pt released per lov in epic 09-24-2017   Diabetes mellitus, type II (HCC)    Diverticulosis of colon    Essential hypertension, benign    followed by pcp   (pt had nuclear stress test (in epic under media) dated 05-25-2008 showed normal perfusion w/ no evidence ischemia, ef 83%   Heart disease    History of bradycardia    previously seen by cardiologist, dr branch, pt released per lov 08-11-2018 in epic, bradycardia resolved   History of sepsis    04-03-2019 hospital admission w/ urosepsis due to obstructive uropathy due to right ureter stone   Hypokalemia    Lipoma of colon    ascending   Lumbar spondylosis    Mixed hyperlipidemia    Nephrolithiasis    per CT 04-03-2019 nonobstructive bilateral stones   Ocular herpes    Osteoarthritis    followed by dr dolphus--- hands, shoulders, feet   Osteoporosis    Rheumatoid arthritis(714.0)    rheumotologist-- dr s. dolphus---  multiple sites, treated with methotrexate / prednisone    Right nephrolithiasis-1.4cm Moderate right-sided urinary tract obstruction/hydronephrosis 04/03/2019   Right ureteral calculus    Seasonal allergies    Sepsis (HCC) 04/03/2019   Sepsis secondary to UTI /Nephrolithiasis with obstructive uropathy 04/03/2019   SUI (stress urinary incontinence, female)    Uterine fibroid     Allergies  Allergen Reactions    Aspirin  Other (See Comments)    REACTION: burns stomach (uncoated)   Sudafed [Pseudoephedrine Hcl] Other (See Comments)    Increased joint pain     OBJECTIVE General Patient is awake, alert, and oriented x 3 and in no acute distress. Derm Skin is dry and supple bilateral. Negative open lesions or macerations. Remaining integument unremarkable. Nails are tender, long, thickened and dystrophic with subungual debris, consistent with onychomycosis, 1-5 bilateral. No signs of infection noted. Vasc  DP and PT pedal pulses palpable bilaterally. Temperature gradient within normal limits.  Neuro Epicritic and protective threshold sensation diminished bilaterally.  Musculoskeletal Exam No symptomatic pedal deformities noted bilateral. Muscular strength within normal limits.  ASSESSMENT 1. Diabetes Mellitus w/ peripheral neuropathy 2.  Pain due to onychomycosis of toenails bilateral  PLAN OF CARE 1. Patient evaluated today. 2. Instructed to maintain good pedal hygiene and foot care. Stressed importance of controlling blood sugar.  3. Mechanical debridement of nails 1-5 bilaterally performed using a nail nipper. Filed with dremel without incident.  4. Return to clinic in 3 mos.     Thresa EMERSON Sar, DPM [facility]  Dr. Thresa EMERSON Sar, DPM    [address]  [place], [place] [address]                Office [phone]  Fax [phone]

## 2023-08-31 ENCOUNTER — Other Ambulatory Visit (HOSPITAL_COMMUNITY)

## 2023-08-31 ENCOUNTER — Encounter (HOSPITAL_COMMUNITY)

## 2023-09-03 ENCOUNTER — Ambulatory Visit: Payer: Medicare PPO | Admitting: Urology

## 2023-09-13 ENCOUNTER — Other Ambulatory Visit: Payer: Self-pay | Admitting: Physician Assistant

## 2023-09-13 DIAGNOSIS — M0579 Rheumatoid arthritis with rheumatoid factor of multiple sites without organ or systems involvement: Secondary | ICD-10-CM

## 2023-09-13 NOTE — Telephone Encounter (Signed)
 Last Fill: 06/28/2023  Labs: 07/30/2023 CBC and CMP are stable. Creatinine remains low. Please forward results to her PCP.   Next Visit: 01/04/2024  Last Visit: 08/02/2023  DX: Rheumatoid arthritis with rheumatoid factor of multiple sites without organ or systems involvement   Current Dose per office note 08/02/2023: Methotrexate  3 tablets by mouth every 7 days   Okay to refill Methotrexate ?

## 2023-09-16 ENCOUNTER — Ambulatory Visit: Admitting: "Endocrinology

## 2023-09-16 DIAGNOSIS — E1169 Type 2 diabetes mellitus with other specified complication: Secondary | ICD-10-CM | POA: Diagnosis not present

## 2023-09-16 DIAGNOSIS — E782 Mixed hyperlipidemia: Secondary | ICD-10-CM | POA: Diagnosis not present

## 2023-09-17 ENCOUNTER — Ambulatory Visit (HOSPITAL_COMMUNITY)
Admission: RE | Admit: 2023-09-17 | Discharge: 2023-09-17 | Disposition: A | Discharge status: Home / Self Care | Source: Ambulatory Visit | Attending: Family Medicine | Admitting: Family Medicine

## 2023-09-17 ENCOUNTER — Ambulatory Visit (HOSPITAL_COMMUNITY)
Admission: RE | Admit: 2023-09-17 | Discharge: 2023-09-17 | Disposition: A | Discharge status: Home / Self Care | Source: Ambulatory Visit | Attending: "Endocrinology | Admitting: "Endocrinology

## 2023-09-17 DIAGNOSIS — M810 Age-related osteoporosis without current pathological fracture: Secondary | ICD-10-CM | POA: Insufficient documentation

## 2023-09-17 DIAGNOSIS — Z1231 Encounter for screening mammogram for malignant neoplasm of breast: Secondary | ICD-10-CM | POA: Diagnosis not present

## 2023-09-17 DIAGNOSIS — Z780 Asymptomatic menopausal state: Secondary | ICD-10-CM | POA: Diagnosis not present

## 2023-09-17 LAB — LIPID PANEL
Chol/HDL Ratio: 2.3 ratio (ref 0.0–4.4)
Cholesterol, Total: 142 mg/dL (ref 100–199)
HDL: 63 mg/dL (ref >39)
LDL Chol Calc (NIH): 62 mg/dL (ref 0–99)
Triglycerides: 91 mg/dL (ref 0–149)
VLDL Cholesterol Cal: 17 mg/dL (ref 5–40)

## 2023-09-17 LAB — HEMOGLOBIN A1C
Est. average glucose Bld gHb Est-mCnc: 131 mg/dL
Hgb A1c MFr Bld: 6.2 % — ABNORMAL HIGH (ref 4.8–5.6)

## 2023-09-21 ENCOUNTER — Telehealth: Payer: Self-pay | Admitting: "Endocrinology

## 2023-09-21 DIAGNOSIS — E876 Hypokalemia: Secondary | ICD-10-CM

## 2023-09-21 NOTE — Telephone Encounter (Signed)
Lab orders updated and sent to [place].

## 2023-09-21 NOTE — Telephone Encounter (Signed)
 Labs need updated

## 2023-09-21 NOTE — Telephone Encounter (Signed)
 If this can be done asap they are going this afternoon or tomorrow morning

## 2023-09-23 ENCOUNTER — Other Ambulatory Visit (HOSPITAL_COMMUNITY)
Admission: RE | Admit: 2023-09-23 | Discharge: 2023-09-23 | Disposition: A | Discharge status: Home / Self Care | Source: Ambulatory Visit | Attending: "Endocrinology | Admitting: "Endocrinology

## 2023-09-23 LAB — COMPREHENSIVE METABOLIC PANEL WITH GFR
ALT: 12 U/L (ref 0–44)
AST: 19 U/L (ref 15–41)
Albumin: 4.1 g/dL (ref 3.5–5.0)
Alkaline Phosphatase: 55 U/L (ref 38–126)
Anion gap: 13 (ref 5–15)
BUN: 21 mg/dL (ref 8–23)
CO2: 24 mmol/L (ref 22–32)
Calcium: 10.2 mg/dL (ref 8.9–10.3)
Chloride: 103 mmol/L (ref 98–111)
Creatinine, Ser: 0.9 mg/dL (ref 0.44–1.00)
GFR, Estimated: >60 mL/min (ref >60)
Glucose, Bld: 101 mg/dL — ABNORMAL HIGH (ref 70–99)
Potassium: 3.6 mmol/L (ref 3.5–5.1)
Sodium: 140 mmol/L (ref 135–145)
Total Bilirubin: 1 mg/dL (ref 0.0–1.2)
Total Protein: 7.1 g/dL (ref 6.5–8.1)

## 2023-09-24 LAB — PTH, INTACT AND CALCIUM
Calcium, Total (PTH): 10.5 mg/dL — ABNORMAL HIGH (ref 8.7–10.3)
PTH: 60 pg/mL (ref 15–65)

## 2023-09-29 ENCOUNTER — Ambulatory Visit: Admitting: "Endocrinology

## 2023-09-29 ENCOUNTER — Encounter: Payer: Self-pay | Admitting: "Endocrinology

## 2023-09-29 DIAGNOSIS — E876 Hypokalemia: Secondary | ICD-10-CM

## 2023-09-29 DIAGNOSIS — M810 Age-related osteoporosis without current pathological fracture: Secondary | ICD-10-CM | POA: Diagnosis not present

## 2023-09-29 NOTE — Progress Notes (Signed)
 09/29/2023        Endocrinology follow-up note   Past Medical History:  Diagnosis Date   Acid reflux    Acute cystitis with hematuria 04/11/2011   Anemia    ARF (acute renal failure) 12/14/2010   Arthritis    CKD (chronic kidney disease), stage II    previously seen by nephrologist--- dr perri,  pt released per lov in epic 09-24-2017   Diabetes mellitus, type II (HCC)    Diverticulosis of colon    Essential hypertension, benign    followed by pcp   (pt had nuclear stress test (in epic under media) dated 05-25-2008 showed normal perfusion w/ no evidence ischemia, ef 83%   Heart disease    History of bradycardia    previously seen by cardiologist, dr branch, pt released per lov 08-11-2018 in epic, bradycardia resolved   History of sepsis    04-03-2019 hospital admission w/ urosepsis due to obstructive uropathy due to right ureter stone   Hypokalemia    Lipoma of colon    ascending   Lumbar spondylosis    Mixed hyperlipidemia    Nephrolithiasis    per CT 04-03-2019 nonobstructive bilateral stones   Ocular herpes    Osteoarthritis    followed by dr dolphus--- hands, shoulders, feet   Osteoporosis    Rheumatoid arthritis(714.0)    rheumotologist-- dr s. dolphus---  multiple sites, treated with methotrexate / prednisone    Right nephrolithiasis-1.4cm Moderate right-sided urinary tract obstruction/hydronephrosis 04/03/2019   Right ureteral calculus    Seasonal allergies    Sepsis (HCC) 04/03/2019   Sepsis secondary to UTI /Nephrolithiasis with obstructive uropathy 04/03/2019   SUI (stress urinary incontinence, female)    Uterine fibroid    Past Surgical History:  Procedure Laterality Date   BUNIONECTOMY Right 2012   CATARACT EXTRACTION W/ INTRAOCULAR LENS  IMPLANT, BILATERAL  2009   CHOLECYSTECTOMY  12/17/2010   Procedure: LAPAROSCOPIC CHOLECYSTECTOMY;  Surgeon: Thresa JAYSON Pulling;  Location: [facility];  Service: General;   Laterality: N/A;   COLONOSCOPY  05/22/2011   Procedure: COLONOSCOPY;  Surgeon: Claudis RAYMOND Rivet, MD;  Location: [facility];  Service: Endoscopy;  Laterality: N/A;  1045   CYSTOSCOPY WITH RETROGRADE PYELOGRAM, URETEROSCOPY AND STENT PLACEMENT Right 05/09/2019   Procedure: CYSTOSCOPY WITH RETROGRADE PYELOGRAM, URETEROSCOPY AND STENT PLACEMENT;  Surgeon: Elisabeth Valli BIRCH, MD;  Location: [facility];  Service: Urology;  Laterality: Right;  1 HR   CYSTOSCOPY WITH STENT PLACEMENT Right 04/03/2019   Procedure: CYSTOSCOPY, RIGHT RETROGRADE PYELOGRAM  WITH STENT PLACEMENT;  Surgeon: Elisabeth Valli BIRCH, MD;  Location: [facility];  Service: Urology;  Laterality: Right;   EXCISION BONE CYST Right 2010   RIGHT SHOULDER   GANGLION CYST EXCISION Left 06/02/2012   Procedure: Excision ganglion left dorsal wrist;  Surgeon: Lemond Stable, MD;  Location: [facility];  Service: Orthopedics;  Laterality: Left;   HOLMIUM LASER APPLICATION Right 05/09/2019   Procedure: HOLMIUM LASER APPLICATION;  Surgeon: Elisabeth Valli BIRCH, MD;  Location: [facility];  Service: Urology;  Laterality: Right;   kidney stones     LUMBAR MICRODISCECTOMY  07-16-2000   @[facility]    L 2  -3   POSTERIOR LAMINECTOMY / DECOMPRESSION LUMBAR SPINE  06-26-2009   @[facility]    w/ LATERAL MASS FUSION , L3 -- 5   SHOULDER ARTHROSCOPY WITH DISTAL CLAVICLE RESECTION Right 01-10-2009   @[facility]    w/ DEBRIDEMENT AND EXCISION RECURRENT GANGLION CYST   Social History   Socioeconomic History  Marital status: Widowed    Spouse name: Not on file   Number of children: 1   Years of education: Not on file   Highest education level: Not on file  Occupational History   Occupation: Retired    Comment: [employer]  Tobacco Use   Smoking status: Never    Passive exposure: Past   Smokeless tobacco: Never  Vaping Use   Vaping status: Never Used  Substance and Sexual Activity   Alcohol use: No   Drug use: Never   Sexual activity: Not Currently    Birth  control/protection: Post-menopausal  Other Topics Concern   Not on file  Social History Narrative   Husband passed away 05-01-2016. It has been financially harder to pay the bills since his passing, but she is making it. Has grandchildren around which makes it easier.    Social Drivers of Corporate investment banker Strain: Low Risk  (08/09/2023)   Overall Financial Resource Strain (CARDIA)    Difficulty of Paying Living Expenses: Not hard at all  Food Insecurity: No Food Insecurity (08/09/2023)   Hunger Vital Sign    Worried About Running Out of Food in the Last Year: Never true    Ran Out of Food in the Last Year: Never true  Transportation Needs: No Transportation Needs (08/09/2023)   PRAPARE - Administrator, Civil Service (Medical): No    Lack of Transportation (Non-Medical): No  Physical Activity: Insufficiently Active (08/09/2023)   Exercise Vital Sign    Days of Exercise per Week: 3 days    Minutes of Exercise per Session: 10 min  Stress: No Stress Concern Present (08/09/2023)   [employer] of Occupational Health - Occupational Stress Questionnaire    Feeling of Stress: Not at all  Social Connections: Moderately Integrated (08/09/2023)   Social Connection and Isolation Panel    Frequency of Communication with Friends and Family: More than three times a week    Frequency of Social Gatherings with Friends and Family: More than three times a week    Attends Religious Services: More than 4 times per year    Active Member of [employer] or Organizations: Yes    Attends Banker Meetings: More than 4 times per year    Marital Status: Widowed   Outpatient Encounter Medications as of 09/29/2023  Medication Sig   acetaminophen  (TYLENOL ) 500 MG tablet Take 1,000 mg by mouth every 6 (six) hours as needed (for pain).    albuterol  (VENTOLIN  HFA) 108 (90 Base) MCG/ACT inhaler TAKE 2 PUFFS BY MOUTH EVERY 6 HOURS AS NEEDED FOR WHEEZE OR SHORTNESS OF BREATH (Patient taking  differently: as needed.)   alendronate  (FOSAMAX ) 70 MG tablet TAKE 1 TABLET BY MOUTH EVERY 7 DAYS. TAKE WITH A FULL GLASS OF WATER  ON AN EMPTY STOMACH.   alendronate  (FOSAMAX ) 70 MG tablet Take 1 tablet (70 mg total) by mouth every 7 (seven) days. Take with a full glass of water  on an empty stomach.   amLODipine  (NORVASC ) 5 MG tablet TAKE 1 TABLET BY MOUTH EVERY DAY   aspirin  EC 81 MG tablet Take 81 mg by mouth daily. Swallow whole.   cetirizine  (ZYRTEC ) 10 MG tablet TAKE 1 TABLET BY MOUTH EVERY DAY   fluticasone  (FLONASE ) 50 MCG/ACT nasal spray SPRAY 2 SPRAYS INTO EACH NOSTRIL EVERY DAY   folic acid  (FOLVITE ) 1 MG tablet TAKE 1 TABLET BY MOUTH EVERY DAY   gabapentin  (NEURONTIN ) 100 MG capsule Take 1 capsule (100  mg total) by mouth at bedtime.   hydrochlorothiazide  (MICROZIDE ) 12.5 MG capsule TAKE 1 CAPSULE BY MOUTH EVERY DAY   methotrexate  (RHEUMATREX) 2.5 MG tablet TAKE 3 TABLETS BY MOUTH ONE TIME PER WEEK   montelukast  (SINGULAIR ) 10 MG tablet TAKE 1 TABLET BY MOUTH EVERYDAY AT BEDTIME   potassium chloride  SA (KLOR-CON  M) 20 MEQ tablet TAKE 1 TABLET BY MOUTH TWICE A DAY   predniSONE  (DELTASONE ) 1 MG tablet TAKE 4 TABLETS (4 MG TOTAL) BY MOUTH DAILY WITH BREAKFAST   Propylene Glycol (SYSTANE BALANCE OP) Place 1 drop into both eyes daily as needed (for dry eyes).    rosuvastatin  (CRESTOR ) 5 MG tablet TAKE 1 TABLET (5 MG TOTAL) BY MOUTH DAILY.   Vibegron  (GEMTESA ) 75 MG TABS Take 1 tablet (75 mg total) by mouth daily.   No facility-administered encounter medications on file as of 09/29/2023.   ALLERGIES: Allergies  Allergen Reactions   Aspirin  Other (See Comments)    REACTION: burns stomach (uncoated)   Sudafed [Pseudoephedrine Hcl] Other (See Comments)    Increased joint pain    VACCINATION STATUS: Immunization History  Administered Date(s) Administered   Fluad Quad(high Dose 65+) 10/19/2019, 09/25/2020, 10/01/2021   INFLUENZA, HIGH DOSE SEASONAL PF 09/29/2016, 09/15/2018,  10/01/2021, 09/27/2022   Influenza Split 11/07/2013   Influenza Whole 10/28/2009, 09/17/2010   Influenza,inj,Quad PF,6+ Mos 09/03/2014   Influenza-Unspecified 10/05/2013, 09/29/2016   Moderna Covid-19 Fall Seasonal Vaccine 37yrs & older 10/19/2021, 09/27/2022   Moderna Covid-19 Vaccine Bivalent Booster 31yrs & up 11/01/2020   Moderna Sars-Covid-2 Vaccination 01/17/2019, 02/27/2019, 10/30/2019, 04/28/2020, 10/19/2021   PPD Test 05/14/2014, 05/28/2015   Pneumococcal Conjugate-13 08/03/2013   Pneumococcal Polysaccharide-23 08/28/2009   Tdap 04/29/2010     HPI   Stacy Meyers is 89 y.o. female who presents today for follow-up.  She was seen in consultation for osteoporosis during her last visit. PMD:  Stacy Rollene BRAVO, MD.  Patient was diagnosed with osteoporosis at approximate age of 18 years.   She denies fractures or falls. No dizziness/vertigo/orthostasis. -She came accompanied by her son-in-law, walking with a cane.   She has no new complaints today.  She has been on Fosamax  for at least 6 years.  She continues to tolerate alendronate .    Review of her last 4 bone density studies show that there has been progressive improvement in her bone density. She did have disproportionately more cortical bone loss,  her most recent bone density from August 2025 showed improvement in that regard as well.  She has no interval falls nor fractures. Her most recent labs showed normal calcium  of 10.2, previously did have mild to moderate hypercalcemia associated with inappropriately normal to slightly high PTH.    She denies any prior history of nephrolithiasis nor parathyroid dysfunction. -Review of her medical records indicate a course of mild hypercalcemia intermittently since 2020.    No weight bearing exercises.   No history of thyrotoxicosis, she has no new complaints today. She has normal renal function.  Menopause was at 89 y/o.   -She did not report family history of osteoporosis.   She has well-controlled type 2 diabetes.  Because of her history of rheumatoid arthritis, she is on ongoing methotrexate  treatment, and intermittent steroids.  -She reports losing 1 1/2 inches of height over time. -Her medical records also show A1c in the range of type 2 diabetes (6.5-6.7%) not currently on medications.  Review of Systems  Limited as above.  Objective:    BP 136/60  Pulse 60   Ht 4' 11 (1.499 m)   Wt 139 lb 3.2 oz (63.1 kg)   BMI 28.11 kg/m   Wt Readings from Last 3 Encounters:  09/29/23 139 lb 3.2 oz (63.1 kg)  08/25/23 138 lb (62.6 kg)  08/09/23 138 lb (62.6 kg)    Physical Exam  Constitutional: + normal  weight for height, not in acute distress, normal state of mind, + walks with a cane.   CMP ( most recent) CMP     Component Value Date/Time   NA 140 09/23/2023 1409   NA 143 07/30/2023 1134   K 3.6 09/23/2023 1409   CL 103 09/23/2023 1409   CO2 24 09/23/2023 1409   GLUCOSE 101 (H) 09/23/2023 1409   BUN 21 09/23/2023 1409   BUN 28 (H) 07/30/2023 1134   CREATININE 0.90 09/23/2023 1409   CREATININE 0.96 (H) 11/06/2021 1339   CALCIUM  10.5 (H) 09/23/2023 1409   CALCIUM  10.2 09/23/2023 1409   PROT 7.1 09/23/2023 1409   PROT 6.3 07/30/2023 1134   ALBUMIN  4.1 09/23/2023 1409   ALBUMIN  4.3 07/30/2023 1134   AST 19 09/23/2023 1409   ALT 12 09/23/2023 1409   ALKPHOS 55 09/23/2023 1409   BILITOT 1.0 09/23/2023 1409   BILITOT 0.5 07/30/2023 1134   GFRNONAA >60 09/23/2023 1409   GFRNONAA 60 03/12/2020 1102   GFRAA 70 03/12/2020 1102     Diabetic Labs (most recent): Lab Results  Component Value Date   HGBA1C 6.2 (H) 09/16/2023   HGBA1C 6.5 (H) 03/09/2023   HGBA1C 6.5 (H) 02/08/2023   MICROALBUR 2.7 02/01/2019   MICROALBUR 51.9 (H) 12/08/2018   MICROALBUR 12.4 (H) 01/17/2018     Lipid Panel ( most recent) Lipid Panel     Component Value Date/Time   CHOL 142 09/16/2023 1351   TRIG 91 09/16/2023 1351   HDL 63 09/16/2023 1351   CHOLHDL 2.3  09/16/2023 1351   CHOLHDL 2.6 10/24/2019 1100   VLDL 26 02/24/2016 0832   LDLCALC 62 09/16/2023 1351   LDLCALC 69 10/24/2019 1100   LABVLDL 17 09/16/2023 1351      Lab Results  Component Value Date   TSH 0.991 02/08/2023   TSH 1.390 03/13/2021   TSH 2.560 02/02/2020   TSH 0.98 10/24/2019   TSH 1.95 01/13/2018   TSH 2.354 01/24/2015   TSH 1.782 07/31/2013   TSH 0.915 04/16/2011   TSH 0.490 12/15/2010   TSH 1.426 03/03/2010   FREET4 1.29 02/02/2020   FREET4 0.97 12/15/2010    Latest Reference Range & Units 01/22/22 16:32 03/06/22 10:07  Glucose 70 - 99 mg/dL  819 (H)  Hemoglobin J8R 4.8 - 5.6 % 6.2 (H)   Est. average glucose Bld gHb Est-mCnc mg/dL 868   PTH, Intact 15 - 65 pg/mL  45  PTH Interp   Comment  (H): Data is abnormally high  Bone density study from August 06, 2021 was reviewed.  Latest bone density from September 2025 was reviewed with her.  Assessment: 1. Osteoporosis  2.    Hypercalcemia  3.  Hyperlipidemia 4.  Hypokalemia  Plan: 1. Osteoporosis - likely postmenopausal    Review of her last 3 bone density studies showed that she is benefiting from treatment with alendronate  a T score improving on her radius.     We reviewed her DEXA scans together, and I explained that based on the T scores, she is responding to her alendronate .  She continues to tolerate her alendronate  70 mg  p.o. weekly.  T-score at distal forearm is -2.7 still consistent with osteoporosis.  She would benefit from staying on Fosamax  as opposed to drug holiday.  She agrees and accepts this decision.  She has normocalcemia at this time.  Her previous presentation was consistent with mild primary hyperparathyroidism.   She is advised to avoid calcium  supplement, her dietary calcium  seems to be adequate.   She will continue to benefit from low-dose vitamin D  .     She will have repeat calcium / PTH before her next visit.  -Hypokalemia: Her potassium currently at 3.6, will continue to benefit  from low-dose potassium supplement-20 mEq p.o. twice daily.     -I discussed with her all fall precautions and avoiding vigorous movements to reduce her risk of fracture.  She is encouraged to continue to use her cane to ambulate.  - we discussed about maintaining a good amount of protein in her diet. She will continue to benefit from her statin intervention.  She is advised to continue Crestor  5 mg p.o. nightly.  Regarding her type 2 diabetes, recent A1c of 6.2%, will not need medication coverage as diabetic she is advised to minimize consumption of processed foods.  - she acknowledges that there is a room for improvement in her food and drink choices. - Suggestion is made for her to avoid simple carbohydrates  from her diet including Cakes, Sweet Desserts, Ice Cream, Soda (diet and regular), Sweet Tea, Candies, Chips, Cookies, Store Bought Juices, Alcohol , Artificial Sweeteners,  Coffee Creamer, and Sugar-free Products, Lemonade. This will help patient to have more stable blood glucose profile and potentially avoid unintended weight gain.   - I advised patient to maintain close follow up with Stacy Rollene BRAVO, MD for primary care needs.   I spent  26  minutes in the care of the patient today including review of labs from Thyroid  Function, CMP, and other relevant labs ; imaging/biopsy records (current and previous including abstractions from other facilities); face-to-face time discussing  her lab results and symptoms, medications doses, her options of short and long term treatment based on the latest standards of care / guidelines;   and documenting the encounter.  Stacy Meyers  participated in the discussions, expressed understanding, and voiced agreement with the above plans.  All questions were answered to her satisfaction. she is encouraged to contact clinic should she have any questions or concerns prior to her return visit.   Follow up plan: Return in about 6 months (around  03/28/2024) for F/U with Pre-visit Labs.   Ranny Earl, MD [facility] [address] [place], [place] [address] Phone: [phone]  Fax: [phone]     09/29/2023, 11:02 AM  This note was partially dictated with voice recognition software. Similar sounding words can be transcribed inadequately or may not  be corrected upon review.

## 2023-10-10 ENCOUNTER — Ambulatory Visit
Admission: RE | Admit: 2023-10-10 | Discharge: 2023-10-10 | Disposition: A | Discharge status: Home / Self Care | Attending: Family Medicine | Admitting: Family Medicine

## 2023-10-10 VITALS — BP 146/65 | HR 75 | Temp 98.3°F | Resp 20

## 2023-10-10 DIAGNOSIS — R319 Hematuria, unspecified: Secondary | ICD-10-CM | POA: Diagnosis not present

## 2023-10-10 DIAGNOSIS — N3281 Overactive bladder: Secondary | ICD-10-CM

## 2023-10-10 LAB — POCT URINE DIPSTICK
Bilirubin, UA: NEGATIVE
Glucose, UA: NEGATIVE mg/dL
Ketones, POC UA: NEGATIVE mg/dL
Leukocytes, UA: NEGATIVE
Nitrite, UA: NEGATIVE
POC PROTEIN,UA: NEGATIVE
Spec Grav, UA: 1.02 (ref 1.010–1.025)
Urobilinogen, UA: 0.2 U/dL
pH, UA: 6.5 (ref 5.0–8.0)

## 2023-10-10 NOTE — ED Provider Notes (Signed)
 [facility]    CSN: [id number] Arrival date & time: 10/10/23  9050      History   Chief Complaint Chief Complaint  Patient presents with   Urinary Frequency    Blood in urine as well with a history of UTI's. - Entered by patient    HPI Stacy Meyers is a 89 y.o. female.   Patient presenting today with 1 day history of trace blood on the tissue after wiping post urination.  Has also had urinary frequency but notes this is not uncommon for her with history of overactive bladder.  She was placed on a medication for her overactive bladder through urologist which was helping but she states it became unaffordable so she has not been on it for quite some time.  Denies fever, chills, suprapubic pressure, abdominal pain, nausea, vomiting, dysuria.  So far not try anything over-the-counter for symptoms.    Past Medical History:  Diagnosis Date   Acid reflux    Acute cystitis with hematuria 04/11/2011   Anemia    ARF (acute renal failure) 12/14/2010   Arthritis    CKD (chronic kidney disease), stage II    previously seen by nephrologist--- dr perri,  pt released per lov in epic 09-24-2017   Diabetes mellitus, type II (HCC)    Diverticulosis of colon    Essential hypertension, benign    followed by pcp   (pt had nuclear stress test (in epic under media) dated 05-25-2008 showed normal perfusion w/ no evidence ischemia, ef 83%   Heart disease    History of bradycardia    previously seen by cardiologist, dr branch, pt released per lov 08-11-2018 in epic, bradycardia resolved   History of sepsis    04-03-2019 hospital admission w/ urosepsis due to obstructive uropathy due to right ureter stone   Hypokalemia    Lipoma of colon    ascending   Lumbar spondylosis    Mixed hyperlipidemia    Nephrolithiasis    per CT 04-03-2019 nonobstructive bilateral stones   Ocular herpes    Osteoarthritis    followed by dr dolphus--- hands, shoulders, feet   Osteoporosis    Rheumatoid  arthritis(714.0)    rheumotologist-- dr s. dolphus---  multiple sites, treated with methotrexate / prednisone    Right nephrolithiasis-1.4cm Moderate right-sided urinary tract obstruction/hydronephrosis 04/03/2019   Right ureteral calculus    Seasonal allergies    Sepsis (HCC) 04/03/2019   Sepsis secondary to UTI /Nephrolithiasis with obstructive uropathy 04/03/2019   SUI (stress urinary incontinence, female)    Uterine fibroid     Patient Active Problem List   Diagnosis Date Noted   Type 2 diabetes mellitus with other specified complication (HCC) 08/08/2023   Osteopenia 08/08/2023   Rheumatoid nodule of left wrist (HCC) 04/20/2023   OAB (overactive bladder) 03/03/2023   Mixed stress and urge urinary incontinence 03/03/2023   Kidney stones 03/03/2023   Renal cyst 03/03/2023   History of recurrent UTIs 03/03/2023   Fall 12/07/2022   Seasonal allergic rhinitis due to pollen 05/27/2022   Reduced vision 01/22/2022   New onset of headaches 01/22/2022   Age related osteoporosis 02/07/2021   Mass of left lower leg 10/24/2020   Headache 08/26/2020   Prediabetes 10/28/2019   H/o Chronic heart failure with preserved ejection fraction (HFpEF) (HCC) 04/03/2019   Umbilical hernia without obstruction and without gangrene 12/10/2018   Fibroids 05/08/2016   Spondylosis of lumbar region without myelopathy or radiculopathy 12/24/2015   Solitary bone cyst of right  shoulder 05/28/2015   Right knee pain 05/28/2015   Osteoarthritis of both shoulders 05/28/2015   Overweight (BMI 25.0-29.9) 04/28/2014   Ganglion cyst of wrist 05/11/2012   Systolic murmur 04/16/2011   Diastolic dysfunction 12/15/2010   Hypokalemia 12/14/2010   Hypercalcemia 12/14/2010   Seasonal allergies 08/28/2010   Herpes simplex virus (HSV) infection 09/16/2008   Rheumatoid arthritis with rheumatoid factor of multiple sites without organ or systems involvement (HCC) 09/16/2008   Mixed hyperlipidemia 05/23/2008   Essential  hypertension, benign 05/23/2008    Past Surgical History:  Procedure Laterality Date   BUNIONECTOMY Right 2012   CATARACT EXTRACTION W/ INTRAOCULAR LENS  IMPLANT, BILATERAL  2009   CHOLECYSTECTOMY  12/17/2010   Procedure: LAPAROSCOPIC CHOLECYSTECTOMY;  Surgeon: Thresa JAYSON Pulling;  Location: [facility];  Service: General;  Laterality: N/A;   COLONOSCOPY  05/22/2011   Procedure: COLONOSCOPY;  Surgeon: Claudis RAYMOND Rivet, MD;  Location: [facility];  Service: Endoscopy;  Laterality: N/A;  1045   CYSTOSCOPY WITH RETROGRADE PYELOGRAM, URETEROSCOPY AND STENT PLACEMENT Right 05/09/2019   Procedure: CYSTOSCOPY WITH RETROGRADE PYELOGRAM, URETEROSCOPY AND STENT PLACEMENT;  Surgeon: Elisabeth Valli BIRCH, MD;  Location: [facility];  Service: Urology;  Laterality: Right;  1 HR   CYSTOSCOPY WITH STENT PLACEMENT Right 04/03/2019   Procedure: CYSTOSCOPY, RIGHT RETROGRADE PYELOGRAM  WITH STENT PLACEMENT;  Surgeon: Elisabeth Valli BIRCH, MD;  Location: [facility];  Service: Urology;  Laterality: Right;   EXCISION BONE CYST Right 2010   RIGHT SHOULDER   GANGLION CYST EXCISION Left 06/02/2012   Procedure: Excision ganglion left dorsal wrist;  Surgeon: Lemond Stable, MD;  Location: [facility];  Service: Orthopedics;  Laterality: Left;   HOLMIUM LASER APPLICATION Right 05/09/2019   Procedure: HOLMIUM LASER APPLICATION;  Surgeon: Elisabeth Valli BIRCH, MD;  Location: [facility];  Service: Urology;  Laterality: Right;   kidney stones     LUMBAR MICRODISCECTOMY  07-16-2000   @[facility]    L 2  -3   POSTERIOR LAMINECTOMY / DECOMPRESSION LUMBAR SPINE  06-26-2009   @[facility]    w/ LATERAL MASS FUSION , L3 -- 5   SHOULDER ARTHROSCOPY WITH DISTAL CLAVICLE RESECTION Right 01-10-2009   @[facility]    w/ DEBRIDEMENT AND EXCISION RECURRENT GANGLION CYST    OB History   No obstetric history on file.      Home Medications    Prior to Admission medications   Medication Sig Start Date End Date Taking? Authorizing Provider  acetaminophen   (TYLENOL ) 500 MG tablet Take 1,000 mg by mouth every 6 (six) hours as needed (for pain).     [provider]  albuterol  (VENTOLIN  HFA) 108 (90 Base) MCG/ACT inhaler TAKE 2 PUFFS BY MOUTH EVERY 6 HOURS AS NEEDED FOR WHEEZE OR SHORTNESS OF BREATH Patient taking differently: as needed. 06/17/21   Antonetta Rollene BRAVO, MD  alendronate  (FOSAMAX ) 70 MG tablet TAKE 1 TABLET BY MOUTH EVERY 7 DAYS. TAKE WITH A FULL GLASS OF WATER  ON AN EMPTY STOMACH. 03/17/21   Nida, Gebreselassie W, MD  alendronate  (FOSAMAX ) 70 MG tablet Take 1 tablet (70 mg total) by mouth every 7 (seven) days. Take with a full glass of water  on an empty stomach. 08/06/23   Antonetta Rollene BRAVO, MD  amLODipine  (NORVASC ) 5 MG tablet TAKE 1 TABLET BY MOUTH EVERY DAY 06/09/23   Antonetta Rollene BRAVO, MD  aspirin  EC 81 MG tablet Take 81 mg by mouth daily. Swallow whole.    [provider]  cetirizine  (ZYRTEC ) 10 MG tablet  TAKE 1 TABLET BY MOUTH EVERY DAY 03/09/23   Antonetta Rollene BRAVO, MD  fluticasone  (FLONASE ) 50 MCG/ACT nasal spray SPRAY 2 SPRAYS INTO EACH NOSTRIL EVERY DAY 08/17/22   Antonetta Rollene BRAVO, MD  folic acid  (FOLVITE ) 1 MG tablet TAKE 1 TABLET BY MOUTH EVERY DAY 03/09/23   Antonetta Rollene BRAVO, MD  gabapentin  (NEURONTIN ) 100 MG capsule Take 1 capsule (100 mg total) by mouth at bedtime. 09/14/22 09/09/23  Camara, Amadou, MD  hydrochlorothiazide  (MICROZIDE ) 12.5 MG capsule TAKE 1 CAPSULE BY MOUTH EVERY DAY 07/20/23   Antonetta Rollene BRAVO, MD  methotrexate  (RHEUMATREX) 2.5 MG tablet TAKE 3 TABLETS BY MOUTH ONE TIME PER WEEK 09/13/23   Cheryl Waddell HERO, PA-C  montelukast  (SINGULAIR ) 10 MG tablet TAKE 1 TABLET BY MOUTH EVERYDAY AT BEDTIME 06/09/23   Antonetta Rollene BRAVO, MD  potassium chloride  SA (KLOR-CON  M) 20 MEQ tablet TAKE 1 TABLET BY MOUTH TWICE A DAY 08/09/23   Nida, Gebreselassie W, MD  predniSONE  (DELTASONE ) 1 MG tablet TAKE 4 TABLETS (4 MG TOTAL) BY MOUTH DAILY WITH BREAKFAST 07/12/23   Cheryl Waddell HERO, PA-C  Propylene Glycol (SYSTANE  BALANCE OP) Place 1 drop into both eyes daily as needed (for dry eyes).     [provider]  rosuvastatin  (CRESTOR ) 5 MG tablet TAKE 1 TABLET (5 MG TOTAL) BY MOUTH DAILY. 07/13/23   Camara, Amadou, MD  Vibegron  (GEMTESA ) 75 MG TABS Take 1 tablet (75 mg total) by mouth daily. 03/05/23   Gerldine Lauraine BROCKS, FNP    Family History Family History  Problem Relation Age of Onset   Stroke Mother    Cancer Father    Diabetes Sister    Diabetes Sister    Kidney failure Brother    Kidney failure Brother    Pancreatic cancer Sister    Kidney Stones Sister        Only one Kidney   Arthritis Sister    Heart disease Daughter     Social History Social History   Tobacco Use   Smoking status: Never    Passive exposure: Past   Smokeless tobacco: Never  Vaping Use   Vaping status: Never Used  Substance Use Topics   Alcohol use: No   Drug use: Never     Allergies   Aspirin  and Sudafed [pseudoephedrine hcl]   Review of Systems Review of Systems Per HPI  Physical Exam Triage Vital Signs ED Triage Vitals [10/10/23 0954]  Encounter Vitals Group     BP (!) 146/65     Girls Systolic BP Percentile      Girls Diastolic BP Percentile      Boys Systolic BP Percentile      Boys Diastolic BP Percentile      Pulse Rate 75     Resp 20     Temp 98.3 F (36.8 C)     Temp src      SpO2 93 %     Weight      Height      Head Circumference      Peak Flow      Pain Score      Pain Loc      Pain Education      Exclude from Growth Chart    No data found.  Updated Vital Signs BP (!) 146/65 (BP Location: Right Arm)   Pulse 75   Temp 98.3 F (36.8 C)   Resp 20   SpO2 93%   Visual Acuity Right Eye Distance:  Left Eye Distance:   Bilateral Distance:    Right Eye Near:   Left Eye Near:    Bilateral Near:     Physical Exam Vitals and nursing note reviewed.  Constitutional:      Appearance: Normal appearance. She is not ill-appearing.  HENT:     Head: Atraumatic.  Eyes:      Extraocular Movements: Extraocular movements intact.     Conjunctiva/sclera: Conjunctivae normal.  Cardiovascular:     Rate and Rhythm: Normal rate.  Pulmonary:     Effort: Pulmonary effort is normal.  Abdominal:     General: Bowel sounds are normal. There is no distension.     Palpations: Abdomen is soft.     Tenderness: There is no abdominal tenderness. There is no right CVA tenderness, left CVA tenderness or guarding.  Musculoskeletal:        General: Normal range of motion.     Cervical back: Normal range of motion and neck supple.  Skin:    General: Skin is warm and dry.  Neurological:     Mental Status: She is alert and oriented to person, place, and time.  Psychiatric:        Mood and Affect: Mood normal.        Thought Content: Thought content normal.        Judgment: Judgment normal.      UC Treatments / Results  Labs (all labs ordered are listed, but only abnormal results are displayed) Labs Reviewed  POCT URINE DIPSTICK - Abnormal; Notable for the following components:      Result Value   Blood, UA trace-intact (*)    All other components within normal limits    EKG   Radiology No results found.  Procedures Procedures (including critical care time)  Medications Ordered in UC Medications - No data to display  Initial Impression / Assessment and Plan / UC Course  I have reviewed the triage vital signs and the nursing notes.  Pertinent labs & imaging results that were available during my care of the patient were reviewed by me and considered in my medical decision making (see chart for details).     Urinalysis today without evidence of urinary tract infection.  Unclear etiology of the trace blood on urinalysis, possibly hydration related as she states she has been very unsuccessful drinking enough fluids.  Increase water  intake, and follow-up with urologist if not resolving.  Return sooner for worsening symptoms.  Final Clinical Impressions(s) / UC  Diagnoses   Final diagnoses:  Hematuria, unspecified type  Overactive bladder     Discharge Instructions      Increase water  intake, follow-up with urology regarding overactive bladder medication and return sooner for recheck if symptoms worsening in any time    ED Prescriptions   None    PDMP not reviewed this encounter.   Stuart Vernell Norris, [place] 10/10/23 1123

## 2023-10-10 NOTE — Discharge Instructions (Signed)
 Increase water  intake, follow-up with urology regarding overactive bladder medication and return sooner for recheck if symptoms worsening in any time

## 2023-10-10 NOTE — ED Triage Notes (Signed)
 Pt reports blood when wiping after urination, x 1 day, urinary frequency. Hx of UTI's

## 2023-11-11 ENCOUNTER — Encounter: Payer: Self-pay | Admitting: Family Medicine

## 2023-11-11 ENCOUNTER — Ambulatory Visit (INDEPENDENT_AMBULATORY_CARE_PROVIDER_SITE_OTHER): Admitting: Family Medicine

## 2023-11-11 VITALS — BP 120/78 | HR 61 | Resp 16 | Ht 59.0 in | Wt 139.0 lb

## 2023-11-11 DIAGNOSIS — E782 Mixed hyperlipidemia: Secondary | ICD-10-CM | POA: Diagnosis not present

## 2023-11-11 DIAGNOSIS — L538 Other specified erythematous conditions: Secondary | ICD-10-CM

## 2023-11-11 DIAGNOSIS — E118 Type 2 diabetes mellitus with unspecified complications: Secondary | ICD-10-CM

## 2023-11-11 DIAGNOSIS — Z23 Encounter for immunization: Secondary | ICD-10-CM | POA: Diagnosis not present

## 2023-11-11 DIAGNOSIS — M810 Age-related osteoporosis without current pathological fracture: Secondary | ICD-10-CM | POA: Diagnosis not present

## 2023-11-11 DIAGNOSIS — I10 Essential (primary) hypertension: Secondary | ICD-10-CM | POA: Diagnosis not present

## 2023-11-11 DIAGNOSIS — E1169 Type 2 diabetes mellitus with other specified complication: Secondary | ICD-10-CM | POA: Diagnosis not present

## 2023-11-11 DIAGNOSIS — M0579 Rheumatoid arthritis with rheumatoid factor of multiple sites without organ or systems involvement: Secondary | ICD-10-CM | POA: Diagnosis not present

## 2023-11-11 NOTE — Patient Instructions (Addendum)
 Annual exam in 18 to 20 weeks  Good foot exam  Good blood pressure  Flu vaccine today  You need eye exam please schedule appointment with Dr Darroll after you leave here  Fasting CBC, lipid, cmp and eGFr, hBA1C 1 week before next appointment

## 2023-11-11 NOTE — Assessment & Plan Note (Signed)
 Diabetes associated with hypertension, hyperlipidemia, and arthritis  Stacy Meyers is reminded of the importance of commitment to daily physical activity for 30 minutes or more, as able and the need to limit carbohydrate intake to 30 to 60 grams per meal to help with blood sugar control.    Stacy Meyers is reminded of the importance of daily foot exam, annual eye examination, and good blood sugar, blood pressure and cholesterol control.     Latest Ref Rng & Units 09/23/2023    2:09 PM 09/16/2023    1:51 PM 07/30/2023   11:34 AM 06/10/2023    1:52 PM 03/09/2023    1:40 PM  Diabetic Labs  HbA1c 4.8 - 5.6 %  6.2    6.5   Chol 100 - 199 mg/dL  857      HDL >60 mg/dL  63      Calc LDL 0 - 99 mg/dL  62      Triglycerides 0 - 149 mg/dL  91      Creatinine 9.55 - 1.00 mg/dL 9.09   8.99  9.03  8.95       11/11/2023   11:13 AM 11/11/2023   10:56 AM 11/11/2023   10:34 AM 10/10/2023    9:54 AM 09/29/2023   10:27 AM 08/25/2023    2:53 PM 08/09/2023    3:09 PM  BP/Weight  Systolic BP 120 132 148 146 136  --  Diastolic BP 78 82 82 65 60  --  Wt. (Lbs)   139  139.2 138 138  BMI   28.07 kg/m2  28.11 kg/m2 27.87 kg/m2 27.87 kg/m2      Latest Ref Rng & Units 11/22/2019   12:00 AM 05/08/2019    8:00 AM  Foot/eye exam completion dates  Eye Exam No Retinopathy No Retinopathy       Foot Form Completion   Done     This result is from an external source.

## 2023-11-12 ENCOUNTER — Other Ambulatory Visit (INDEPENDENT_AMBULATORY_CARE_PROVIDER_SITE_OTHER): Payer: Self-pay | Admitting: Physician Assistant

## 2023-11-12 DIAGNOSIS — H9193 Unspecified hearing loss, bilateral: Secondary | ICD-10-CM

## 2023-11-15 DIAGNOSIS — L538 Other specified erythematous conditions: Secondary | ICD-10-CM | POA: Insufficient documentation

## 2023-11-15 NOTE — Progress Notes (Unsigned)
 Subjective: No diagnosis found.   02/04/23: Stacy Meyers returns today in f/u for her history of nocturia with UUI, UTI's, renal stones and cysts.  She is not currently on any therapy for these conditions. She had a citrobacter UTI on 12/07/22 and was treated with augmentin .  She had negative cultures in 5/24 and 2/24. She is doing well today.  She has frequency with urgency with UUI.    02/05/22: Stacy Meyers returns today in /fu for her history of Nocturia with UUI, UTI's renal stones and renal cysts.  A renal US  on 12/31/21 showed no stones and only small cysts. She has had no flank pain or hematuria.  She has urgency and frequency with rare incontinence.   01/09/21: Stacy Meyers returns today in f/u.  She had a culture on 10/24/20 that was negative. She has done well without recent UTI's and she has had no flank pain or hematuria.  She has nocturia x 4-5.  She denies daytime incontinence.   Her UA is unremarkable.  KUB and renal US  prior to this visit shows stable renal stones and cysts without obstruction.   07/04/20: Stacy Meyers returns today in f/u.   Her culture on 05/02/20 was negative.   She had a renal US  that showed bilateral renal stones without obstruction on 05/18/20:  She had some right back pain that was bruised.  That lasted 2-3 days but has resolved.  She has no dysuria or hematuria.  She has nocturia up to 3x.   She continues to wear a pad.   Her UA has 6-10 WBC and few bacteria.    05/02/20: Stacy Meyers is an 89 yo female who is sent by Dr. Antonetta for recurrent e. Coli UTI's with multiple positive cultures since 2/21 with the last on 03/14/20.   She had e. Coli sepsis in 3/21.   She had a 1.4cm right proximal stone with obstruction in 3/21 she was stented and then had ureteroscopy on 05/09/19.  She had a tethered stent and remembers pulling it out but she never followed up with Dr. Elisabeth after the procedure.  She has had no follow up imaging.  She currently has no dysuria but she has nocturia and frequency with urgency  with UUI.  She has had no hematuria. She has occasional intermittent right low back pain.  Her urine was clear on 05/01/20.  ROS:  ROS  Allergies  Allergen Reactions   Aspirin  Other (See Comments)    REACTION: burns stomach (uncoated)   Sudafed [Pseudoephedrine Hcl] Other (See Comments)    Increased joint pain    Past Medical History:  Diagnosis Date   Acid reflux    Acute cystitis with hematuria 04/11/2011   Anemia    ARF (acute renal failure) 12/14/2010   Arthritis    CKD (chronic kidney disease), stage II    previously seen by nephrologist--- dr perri,  pt released per lov in epic 09-24-2017   Diabetes mellitus, type II (HCC)    Diverticulosis of colon    Essential hypertension, benign    followed by pcp   (pt had nuclear stress test (in epic under media) dated 05-25-2008 showed normal perfusion w/ no evidence ischemia, ef 83%   Heart disease    History of bradycardia    previously seen by cardiologist, dr branch, pt released per lov 08-11-2018 in epic, bradycardia resolved   History of sepsis    04-03-2019 hospital admission w/ urosepsis due to obstructive uropathy due to right ureter stone   Hypokalemia  Lipoma of colon    ascending   Lumbar spondylosis    Mixed hyperlipidemia    Nephrolithiasis    per CT 04-03-2019 nonobstructive bilateral stones   Ocular herpes    Osteoarthritis    followed by dr dolphus--- hands, shoulders, feet   Osteoporosis    Rheumatoid arthritis(714.0)    rheumotologist-- dr s. dolphus---  multiple sites, treated with methotrexate / prednisone    Right nephrolithiasis-1.4cm Moderate right-sided urinary tract obstruction/hydronephrosis 04/03/2019   Right ureteral calculus    Seasonal allergies    Sepsis (HCC) 04/03/2019   Sepsis secondary to UTI /Nephrolithiasis with obstructive uropathy 04/03/2019   SUI (stress urinary incontinence, female)    Uterine fibroid     Past Surgical History:  Procedure Laterality Date   BUNIONECTOMY Right  2012   CATARACT EXTRACTION W/ INTRAOCULAR LENS  IMPLANT, BILATERAL  2009   CHOLECYSTECTOMY  12/17/2010   Procedure: LAPAROSCOPIC CHOLECYSTECTOMY;  Surgeon: Thresa JAYSON Pulling;  Location: [facility];  Service: General;  Laterality: N/A;   COLONOSCOPY  05/22/2011   Procedure: COLONOSCOPY;  Surgeon: Claudis RAYMOND Rivet, MD;  Location: [facility];  Service: Endoscopy;  Laterality: N/A;  1045   CYSTOSCOPY WITH RETROGRADE PYELOGRAM, URETEROSCOPY AND STENT PLACEMENT Right 05/09/2019   Procedure: CYSTOSCOPY WITH RETROGRADE PYELOGRAM, URETEROSCOPY AND STENT PLACEMENT;  Surgeon: Elisabeth Valli BIRCH, MD;  Location: [facility];  Service: Urology;  Laterality: Right;  1 HR   CYSTOSCOPY WITH STENT PLACEMENT Right 04/03/2019   Procedure: CYSTOSCOPY, RIGHT RETROGRADE PYELOGRAM  WITH STENT PLACEMENT;  Surgeon: Elisabeth Valli BIRCH, MD;  Location: [facility];  Service: Urology;  Laterality: Right;   EXCISION BONE CYST Right 2010   RIGHT SHOULDER   GANGLION CYST EXCISION Left 06/02/2012   Procedure: Excision ganglion left dorsal wrist;  Surgeon: Lemond Stable, MD;  Location: [facility];  Service: Orthopedics;  Laterality: Left;   HOLMIUM LASER APPLICATION Right 05/09/2019   Procedure: HOLMIUM LASER APPLICATION;  Surgeon: Elisabeth Valli BIRCH, MD;  Location: [facility];  Service: Urology;  Laterality: Right;   kidney stones     LUMBAR MICRODISCECTOMY  07-16-2000   @[facility]    L 2  -3   POSTERIOR LAMINECTOMY / DECOMPRESSION LUMBAR SPINE  06-26-2009   @[facility]    w/ LATERAL MASS FUSION , L3 -- 5   SHOULDER ARTHROSCOPY WITH DISTAL CLAVICLE RESECTION Right 01-10-2009   @[facility]    w/ DEBRIDEMENT AND EXCISION RECURRENT GANGLION CYST    Social History   Socioeconomic History   Marital status: Widowed    Spouse name: Not on file   Number of children: 1   Years of education: Not on file   Highest education level: Not on file  Occupational History   Occupation: Retired    Comment: [employer]  Tobacco Use   Smoking status:  Never    Passive exposure: Past   Smokeless tobacco: Never  Vaping Use   Vaping status: Never Used  Substance and Sexual Activity   Alcohol use: No   Drug use: Never   Sexual activity: Not Currently    Birth control/protection: Post-menopausal  Other Topics Concern   Not on file  Social History Narrative   Husband passed away 05-08-2016. It has been financially harder to pay the bills since his passing, but she is making it. Has grandchildren around which makes it easier.    Social Drivers of Corporate Investment Banker Strain: Low Risk  (08/09/2023)   Overall Financial Resource Strain (CARDIA)  Difficulty of Paying Living Expenses: Not hard at all  Food Insecurity: No Food Insecurity (08/09/2023)   Hunger Vital Sign    Worried About Running Out of Food in the Last Year: Never true    Ran Out of Food in the Last Year: Never true  Transportation Needs: No Transportation Needs (08/09/2023)   PRAPARE - Administrator, Civil Service (Medical): No    Lack of Transportation (Non-Medical): No  Physical Activity: Insufficiently Active (08/09/2023)   Exercise Vital Sign    Days of Exercise per Week: 3 days    Minutes of Exercise per Session: 10 min  Stress: No Stress Concern Present (08/09/2023)   [employer] of Occupational Health - Occupational Stress Questionnaire    Feeling of Stress: Not at all  Social Connections: Moderately Integrated (08/09/2023)   Social Connection and Isolation Panel    Frequency of Communication with Friends and Family: More than three times a week    Frequency of Social Gatherings with Friends and Family: More than three times a week    Attends Religious Services: More than 4 times per year    Active Member of [employer] or Organizations: Yes    Attends Banker Meetings: More than 4 times per year    Marital Status: Widowed  Intimate Partner Violence: Not At Risk (08/09/2023)   Humiliation, Afraid, Rape, and Kick questionnaire    Fear  of Current or Ex-Partner: No    Emotionally Abused: No    Physically Abused: No    Sexually Abused: No    Family History  Problem Relation Age of Onset   Stroke Mother    Cancer Father    Diabetes Sister    Diabetes Sister    Kidney failure Brother    Kidney failure Brother    Pancreatic cancer Sister    Kidney Stones Sister        Only one Kidney   Arthritis Sister    Heart disease Daughter     Anti-infectives: Anti-infectives (From admission, onward)    None       Current Outpatient Medications  Medication Sig Dispense Refill   acetaminophen  (TYLENOL ) 500 MG tablet Take 1,000 mg by mouth every 6 (six) hours as needed (for pain).      albuterol  (VENTOLIN  HFA) 108 (90 Base) MCG/ACT inhaler TAKE 2 PUFFS BY MOUTH EVERY 6 HOURS AS NEEDED FOR WHEEZE OR SHORTNESS OF BREATH (Patient taking differently: as needed.) 18 each 2   alendronate  (FOSAMAX ) 70 MG tablet TAKE 1 TABLET BY MOUTH EVERY 7 DAYS. TAKE WITH A FULL GLASS OF WATER  ON AN EMPTY STOMACH. 12 tablet 1   alendronate  (FOSAMAX ) 70 MG tablet Take 1 tablet (70 mg total) by mouth every 7 (seven) days. Take with a full glass of water  on an empty stomach. 4 tablet 11   amLODipine  (NORVASC ) 5 MG tablet TAKE 1 TABLET BY MOUTH EVERY DAY 90 tablet 0   aspirin  EC 81 MG tablet Take 81 mg by mouth daily. Swallow whole.     cetirizine  (ZYRTEC ) 10 MG tablet TAKE 1 TABLET BY MOUTH EVERY DAY 90 tablet 1   fluticasone  (FLONASE ) 50 MCG/ACT nasal spray SPRAY 2 SPRAYS INTO EACH NOSTRIL EVERY DAY 48 mL 1   folic acid  (FOLVITE ) 1 MG tablet TAKE 1 TABLET BY MOUTH EVERY DAY 90 tablet 3   gabapentin  (NEURONTIN ) 100 MG capsule Take 1 capsule (100 mg total) by mouth at bedtime. 90 capsule 3   hydrochlorothiazide  (MICROZIDE ) 12.5  MG capsule TAKE 1 CAPSULE BY MOUTH EVERY DAY 90 capsule 1   methotrexate  (RHEUMATREX) 2.5 MG tablet TAKE 3 TABLETS BY MOUTH ONE TIME PER WEEK 36 tablet 0   montelukast  (SINGULAIR ) 10 MG tablet TAKE 1 TABLET BY MOUTH EVERYDAY  AT BEDTIME 90 tablet 1   potassium chloride  SA (KLOR-CON  M) 20 MEQ tablet TAKE 1 TABLET BY MOUTH TWICE A DAY 180 tablet 0   predniSONE  (DELTASONE ) 1 MG tablet TAKE 4 TABLETS (4 MG TOTAL) BY MOUTH DAILY WITH BREAKFAST 360 tablet 0   Propylene Glycol (SYSTANE BALANCE OP) Place 1 drop into both eyes daily as needed (for dry eyes).      rosuvastatin  (CRESTOR ) 5 MG tablet TAKE 1 TABLET (5 MG TOTAL) BY MOUTH DAILY. 90 tablet 1   No current facility-administered medications for this visit.     Objective: Vital signs in last 24 hours: There were no vitals taken for this visit.  Intake/Output from previous day: No intake/output data recorded. Intake/Output this shift: @IOTHISSHIFT @   Physical Exam  Lab Results:  No results found for this or any previous visit (from the past 24 hours).   Recent Results (from the past 2160 hours)  Lipid panel     Status: None   Collection Time: 09/16/23  1:51 PM  Result Value Ref Range   Cholesterol, Total 142 100 - 199 mg/dL   Triglycerides 91 0 - 149 mg/dL   HDL 63 >60 mg/dL   VLDL Cholesterol Cal 17 5 - 40 mg/dL   LDL Chol Calc (NIH) 62 0 - 99 mg/dL   Chol/HDL Ratio 2.3 0.0 - 4.4 ratio    Comment:                                   T. Chol/HDL Ratio                                             Men  Women                               1/2 Avg.Risk  3.4    3.3                                   Avg.Risk  5.0    4.4                                2X Avg.Risk  9.6    7.1                                3X Avg.Risk 23.4   11.0   Hemoglobin A1c     Status: Abnormal   Collection Time: 09/16/23  1:51 PM  Result Value Ref Range   Hgb A1c MFr Bld 6.2 (H) 4.8 - 5.6 %    Comment:          Prediabetes: 5.7 - 6.4          Diabetes: >6.4          Glycemic control for adults with  diabetes: <7.0    Est. average glucose Bld gHb Est-mCnc 131 mg/dL  PTH, intact and calcium      Status: Abnormal   Collection Time: 09/23/23  2:09 PM  Result Value Ref Range   PTH  60 15 - 65 pg/mL   Calcium , Total (PTH) 10.5 (H) 8.7 - 10.3 mg/dL   PTH Interp Comment     Comment: (NOTE) Interpretation                 Intact PTH    Calcium                                 (pg/mL)      (mg/dL) Normal                          15 - 65     8.6 - 10.2 Primary Hyperparathyroidism         >65          >10.2 Secondary Hyperparathyroidism       >65          <10.2 Non-Parathyroid Hypercalcemia       <65          >10.2 Hypoparathyroidism                  <15          < 8.6 Non-Parathyroid Hypocalcemia    15 - 65          < 8.6 Performed At: [facility] [address] [place], [place] [id number] Jennette Shorter MD Ey:[id number]   Comprehensive metabolic panel with GFR     Status: Abnormal   Collection Time: 09/23/23  2:09 PM  Result Value Ref Range   Sodium 140 135 - 145 mmol/L   Potassium 3.6 3.5 - 5.1 mmol/L   Chloride 103 98 - 111 mmol/L   CO2 24 22 - 32 mmol/L   Glucose, Bld 101 (H) 70 - 99 mg/dL    Comment: Glucose reference range applies only to samples taken after fasting for at least 8 hours.   BUN 21 8 - 23 mg/dL   Creatinine, Ser 9.09 0.44 - 1.00 mg/dL   Calcium  10.2 8.9 - 10.3 mg/dL   Total Protein 7.1 6.5 - 8.1 g/dL   Albumin  4.1 3.5 - 5.0 g/dL   AST 19 15 - 41 U/L   ALT 12 0 - 44 U/L   Alkaline Phosphatase 55 38 - 126 U/L   Total Bilirubin 1.0 0.0 - 1.2 mg/dL   GFR, Estimated >39 >39 mL/min    Comment: (NOTE) Calculated using the CKD-EPI Creatinine Equation (2021)    Anion gap 13 5 - 15    Comment: Performed at [facility], [address] [place], [place] [address]  POCT URINE DIPSTICK     Status: Abnormal   Collection Time: 10/10/23 10:02 AM  Result Value Ref Range   Color, UA yellow yellow   Clarity, UA clear clear   Glucose, UA negative negative mg/dL   Bilirubin, UA negative negative   Ketones, POC UA negative negative mg/dL   Spec Grav, UA 8.979 8.989 - 1.025   Blood, UA trace-intact (A) negative   pH, UA 6.5 5.0 - 8.0   POC  PROTEIN,UA negative negative, trace   Urobilinogen, UA 0.2 0.2 or 1.0 E.U./dL   Nitrite, UA Negative Negative   Leukocytes, UA  Negative Negative       BMET No results for input(s): NA, K, CL, CO2, GLUCOSE, BUN, CREATININE, CALCIUM  in the last 72 hours. PT/INR No results for input(s): LABPROT, INR in the last 72 hours. ABG No results for input(s): PHART, HCO3 in the last 72 hours.  Invalid input(s): PCO2, PO2 UA reviewed.   Recent culture reviewed.  Studies/Results:   MM 3D SCREENING MAMMOGRAM BILATERAL BREAST Result Date: 09/21/2023 CLINICAL DATA:  Screening. EXAM: DIGITAL SCREENING BILATERAL MAMMOGRAM WITH TOMOSYNTHESIS AND CAD TECHNIQUE: Bilateral screening digital craniocaudal and mediolateral oblique mammograms were obtained. Bilateral screening digital breast tomosynthesis was performed. The images were evaluated with computer-aided detection. COMPARISON:  Previous exam(s). ACR Breast Density Category b: There are scattered areas of fibroglandular density. FINDINGS: There are no findings suspicious for malignancy. IMPRESSION: No mammographic evidence of malignancy. A result letter of this screening mammogram will be mailed directly to the patient. RECOMMENDATION: Screening mammogram in one year. (Code:SM-B-01Y) BI-RADS CATEGORY  1: Negative. Electronically Signed   By: Reyes Phi M.D.   On: 09/21/2023 10:42   DG Bone Density Result Date: 09/17/2023 EXAM: DUAL X-RAY ABSORPTIOMETRY (DXA) FOR BONE MINERAL DENSITY 09/17/2023 10:02 am CLINICAL DATA:  89 year old Female Postmenopausal. Follow up for osteoporosis on rx with fosamax  Patient is or has been on glucocorticoid therapy. Patient is or has been on bone building therapies. TECHNIQUE: An axial (e.g., hips, spine) and/or appendicular (e.g., radius) exam was performed, as appropriate, using [employer] Psychologist[facility]. Images are obtained for bone mineral density measurement and are not  obtained for diagnostic purposes. [id number] Exclusions: Lumbar spine due to advance degenerative changes COMPARISON:  08/06/2021 FINDINGS: Scan quality: Good. LEFT FEMORAL NECK: BMD (in g/cm2): 0.836 T-score: -1.5 Z-score: 0.2 LEFT TOTAL HIP: BMD (in g/cm2): 0.985 T-score: -0.2 Z-score: 1.4 RIGHT FEMORAL NECK: BMD (in g/cm2): 0.794 T-score: -1.8 Z-score: -0.1 RIGHT TOTAL HIP: BMD (in g/cm2): 0.884 T-score: -1.0 Z-score: 0.6 DUAL-FEMUR TOTAL MEAN: Rate of change from previous exam: 6.1 % RIGHT FOREARM (RADIUS 33%): BMD (in g/cm2): 0.520 T-score: -2.7 Z-score: 0.2 Rate of change from previous exam: -8.5 % FRAX 10-YEAR PROBABILITY OF FRACTURE: FRAX not reported as the lowest BMD is not in the osteopenia range. IMPRESSION: Osteoporosis based on BMD. Fracture risk is unknown due to history of bone building therapy. RECOMMENDATIONS: 1. All patients should optimize calcium  and vitamin D  intake. 2. Consider FDA-approved medical therapies in postmenopausal women and men aged 46 years and older, based on the following: - A hip or vertebral (clinical or morphometric) fracture - T-score less than or equal to -2.5 and secondary causes have been excluded. - Low bone mass (T-score between -1.0 and -2.5) and a 10-year probability of a hip fracture greater than or equal to 3% or a 10-year probability of a major osteoporosis-related fracture greater than or equal to 20% based on the [place] -adapted WHO algorithm. - Clinician judgment and/or patient preferences may indicate treatment for people with 10-year fracture probabilities above or below these levels 3. Patients with diagnosis of osteoporosis or at high risk for fracture should have regular bone mineral density tests. For patients eligible for Medicare, routine testing is allowed once every 2 years. The testing frequency can be increased to one year for patients who have rapidly progressing disease, those who are receiving or discontinuing medical therapy to restore bone mass, or  have additional risk factors. Electronically Signed   By: Dina  Arceo M.D.   On: 09/17/2023 15:12  EPIC records reviewed.   Assessment/Plan: Recurrent UTI with prior sepsis.   She had her last UTI on 12/07/22.   History of nephrolithiasis but renal US  on 12/31/21 showed no stones but stable renal cysts and cortical atrophy.     Mixed incontinence with nocturia.  Stable and she is interested in therapy.  I will have her try Gemtesa  and reviewed the instructions and side effects.   She will return in a month for a PVR and UA.   No orders of the defined types were placed in this encounter.     No orders of the defined types were placed in this encounter.    No follow-ups on file.    CC: Dr. Rollene Pesa.     Garnette HERO Patryck Kilgore 11/15/2023 [phone] ID: Stacy Meyers, female   DOB: 1932-12-07, 89 y.o.   MRN: [id number]

## 2023-11-15 NOTE — Assessment & Plan Note (Signed)
 Hyperlipidemia:Low fat diet discussed and encouraged.   Lipid Panel  Lab Results  Component Value Date   CHOL 142 09/16/2023   HDL 63 09/16/2023   LDLCALC 62 09/16/2023   TRIG 91 09/16/2023   CHOLHDL 2.3 09/16/2023     Controlled, no change in medication Updated lab needed at/ before next visit.

## 2023-11-15 NOTE — Assessment & Plan Note (Signed)
 Controlled on medication with improved function, rheumatology manages

## 2023-11-15 NOTE — Assessment & Plan Note (Signed)
 New onset intermittent bleeding with wiping, recommend softer toilet paper and use of vaseline as needed, until area less irritated/ heals, no obvious skin breakdown and mild erythema present

## 2023-11-15 NOTE — Progress Notes (Signed)
 Stacy Meyers     MRN: [id number]      DOB: 03/29/1932  Chief Complaint  Patient presents with   Hypertension    Follow up    Rectal Bleeding    Pt states she has been seeing blood when wiping after bowel movements x1 week. States there is none in stool or toilet and not every time she wipes     HPI Stacy Meyers is here for follow up and re-evaluation of chronic medical conditions, medication management and review of any available recent lab and radiology data.  Preventive health is updated, specifically  Cancer screening and Immunization.   Questions or concerns regarding consultations or procedures which the PT has had in the interim are  addressed. The PT denies any adverse reactions to current medications since the last visit.  New onset rectal blood intermittently as above  ROS Denies recent fever or chills. Denies sinus pressure, nasal congestion, ear pain or sore throat. Denies chest congestion, productive cough or wheezing. Denies chest pains, palpitations and leg swelling Denies abdominal pain, nausea, vomiting,diarrhea or constipation.   Denies dysuria, frequency, hesitancy or incontinence. Denies uncontrolled joint pain, swelling and limitation in mobility. Denies headaches, seizures, numbness, or tingling. Denies depression, anxiety or insomnia.  PE  BP 120/78   Pulse 61   Resp 16   Ht 4' 11 (1.499 m)   Wt 139 lb (63 kg)   SpO2 96%   BMI 28.07 kg/m   Patient alert and oriented and in no cardiopulmonary distress.  HEENT: No facial asymmetry, EOMI,     Neck decreased ROM .  Chest: Clear to auscultation bilaterally.  CVS: S1, S2 no murmurs, no S3.Regular rate.  ABD: Soft non tender. Rectal/perianal area mild hyperemia  Ext: No edema  MS: decreased  ROM spine, shoulders, hips and knees.Marked joint deformity of digits  Skin: Intact, no ulcerations or rash noted.  Psych: Good eye contact, normal affect. Memory intact not anxious or depressed  appearing.  CNS: CN 2-12 intact, power,  normal throughout.no focal deficits noted.   Assessment & Plan  Type 2 diabetes mellitus with other specified complication (HCC) Diabetes associated with hypertension, hyperlipidemia, and arthritis  Stacy Meyers is reminded of the importance of commitment to daily physical activity for 30 minutes or more, as able and the need to limit carbohydrate intake to 30 to 60 grams per meal to help with blood sugar control.    Stacy Meyers is reminded of the importance of daily foot exam, annual eye examination, and good blood sugar, blood pressure and cholesterol control.     Latest Ref Rng & Units 09/23/2023    2:09 PM 09/16/2023    1:51 PM 07/30/2023   11:34 AM 06/10/2023    1:52 PM 03/09/2023    1:40 PM  Diabetic Labs  HbA1c 4.8 - 5.6 %  6.2    6.5   Chol 100 - 199 mg/dL  857      HDL >60 mg/dL  63      Calc LDL 0 - 99 mg/dL  62      Triglycerides 0 - 149 mg/dL  91      Creatinine 9.55 - 1.00 mg/dL 9.09   8.99  9.03  8.95       11/11/2023   11:13 AM 11/11/2023   10:56 AM 11/11/2023   10:34 AM 10/10/2023    9:54 AM 09/29/2023   10:27 AM 08/25/2023    2:53 PM 08/09/2023  3:09 PM  BP/Weight  Systolic BP 120 132 148 146 136  --  Diastolic BP 78 82 82 65 60  --  Wt. (Lbs)   139  139.2 138 138  BMI   28.07 kg/m2  28.11 kg/m2 27.87 kg/m2 27.87 kg/m2      Latest Ref Rng & Units 11/22/2019   12:00 AM 05/08/2019    8:00 AM  Foot/eye exam completion dates  Eye Exam No Retinopathy No Retinopathy       Foot Form Completion   Done     This result is from an external source.        Immunization due After obtaining informed consent, the influenza vaccine is  administered , with no adverse effect noted at the time of administration.   Rheumatoid arthritis with rheumatoid factor of multiple sites without organ or systems involvement (HCC) Controlled on medication with improved function, rheumatology manages  Mixed hyperlipidemia Hyperlipidemia:Low fat  diet discussed and encouraged.   Lipid Panel  Lab Results  Component Value Date   CHOL 142 09/16/2023   HDL 63 09/16/2023   LDLCALC 62 09/16/2023   TRIG 91 09/16/2023   CHOLHDL 2.3 09/16/2023     Controlled, no change in medication Updated lab needed at/ before next visit.   Perianal erythema New onset intermittent bleeding with wiping, recommend softer toilet paper and use of vaseline as needed, until area less irritated/ heals, no obvious skin breakdown and mild erythema present

## 2023-11-15 NOTE — Assessment & Plan Note (Signed)
 Needs definitive treatment need o f/u on this

## 2023-11-15 NOTE — Assessment & Plan Note (Signed)
 After obtaining informed consent, the influenza vaccine is  administered , with no adverse effect noted at the time of administration.

## 2023-11-16 ENCOUNTER — Ambulatory Visit (INDEPENDENT_AMBULATORY_CARE_PROVIDER_SITE_OTHER): Admitting: Urology

## 2023-11-16 ENCOUNTER — Encounter (INDEPENDENT_AMBULATORY_CARE_PROVIDER_SITE_OTHER): Payer: Self-pay | Admitting: Physician Assistant

## 2023-11-16 ENCOUNTER — Ambulatory Visit (INDEPENDENT_AMBULATORY_CARE_PROVIDER_SITE_OTHER): Admitting: Physician Assistant

## 2023-11-16 ENCOUNTER — Ambulatory Visit (INDEPENDENT_AMBULATORY_CARE_PROVIDER_SITE_OTHER): Admitting: Audiology

## 2023-11-16 VITALS — BP 126/78 | HR 76 | Temp 97.4°F | Ht 59.5 in | Wt 137.0 lb

## 2023-11-16 VITALS — BP 153/68 | HR 81

## 2023-11-16 DIAGNOSIS — H903 Sensorineural hearing loss, bilateral: Secondary | ICD-10-CM

## 2023-11-16 DIAGNOSIS — N3281 Overactive bladder: Secondary | ICD-10-CM | POA: Diagnosis not present

## 2023-11-16 DIAGNOSIS — H6121 Impacted cerumen, right ear: Secondary | ICD-10-CM

## 2023-11-16 DIAGNOSIS — Z87440 Personal history of urinary (tract) infections: Secondary | ICD-10-CM

## 2023-11-16 DIAGNOSIS — N200 Calculus of kidney: Secondary | ICD-10-CM

## 2023-11-16 DIAGNOSIS — N3946 Mixed incontinence: Secondary | ICD-10-CM

## 2023-11-16 LAB — BLADDER SCAN AMB NON-IMAGING: Scan Result: 0

## 2023-11-16 MED ORDER — SOLIFENACIN SUCCINATE 10 MG PO TABS
10.0000 mg | ORAL_TABLET | Freq: Every day | ORAL | 11 refills | Status: AC
Start: 2023-11-16 — End: ?

## 2023-11-16 NOTE — Progress Notes (Unsigned)
 Bladder Scan completed today due to reason of OAB  Patient can void prior to the bladder scan. Bladder scan result: 0  Performed By: Exie T. CMA  Additional notes- Patient is scheduled to follow up with MD

## 2023-11-16 NOTE — Progress Notes (Addendum)
 Dear Dr. Antonetta, Here is my assessment for our mutual patient, Stacy Meyers. Thank you for allowing me the opportunity to care for your patient. Please do not hesitate to contact me should you have any other questions. Sincerely, Chyrl Cohen PA-C  Otolaryngology Clinic Note Referring provider: Dr. Antonetta HPI:  Stacy Meyers is a 89 y.o. female kindly referred by Dr. Antonetta   Discussed the use of AI scribe software for clinical note transcription with the patient, who gave verbal consent to proceed.  History of Present Illness   Stacy Meyers is an 89 year old female who presents with hearing loss. She is accompanied by her son, who lives with her and helps take care of her.  She has been experiencing bilateral hearing loss for the past two to three weeks, which she describes as the worst it has ever been. She is unsure which ear is more affected. There is no ear pain, history of ear trauma, or surgery, and no significant exposure to loud noises.  A recent hearing test indicated some wax buildup in her ears, though she is unsure which ear was affected. She has not had any known issues with earwax in the past.  She lives with her son, who assists in her care, and her daughter also helps out occasionally.           Independent Review of Additional Tests or Records:  Audiological evaluation 11/16/2023    Bilateral sensorineural hearing loss   PMH/Meds/All/SocHx/FamHx/ROS:   Past Medical History:  Diagnosis Date   Acid reflux    Acute cystitis with hematuria 04/11/2011   Anemia    ARF (acute renal failure) 12/14/2010   Arthritis    CKD (chronic kidney disease), stage II    previously seen by nephrologist--- dr perri,  pt released per lov in epic 09-24-2017   Diabetes mellitus, type II (HCC)    Diverticulosis of colon    Essential hypertension, benign    followed by pcp   (pt had nuclear stress test (in epic under media) dated 05-25-2008 showed normal perfusion w/ no evidence  ischemia, ef 83%   Heart disease    History of bradycardia    previously seen by cardiologist, dr branch, pt released per lov 08-11-2018 in epic, bradycardia resolved   History of sepsis    04-03-2019 hospital admission w/ urosepsis due to obstructive uropathy due to right ureter stone   Hypokalemia    Lipoma of colon    ascending   Lumbar spondylosis    Mixed hyperlipidemia    Nephrolithiasis    per CT 04-03-2019 nonobstructive bilateral stones   Ocular herpes    Osteoarthritis    followed by dr dolphus--- hands, shoulders, feet   Osteoporosis    Rheumatoid arthritis(714.0)    rheumotologist-- dr s. dolphus---  multiple sites, treated with methotrexate / prednisone    Right nephrolithiasis-1.4cm Moderate right-sided urinary tract obstruction/hydronephrosis 04/03/2019   Right ureteral calculus    Seasonal allergies    Sepsis (HCC) 04/03/2019   Sepsis secondary to UTI /Nephrolithiasis with obstructive uropathy 04/03/2019   SUI (stress urinary incontinence, female)    Uterine fibroid      Past Surgical History:  Procedure Laterality Date   BUNIONECTOMY Right 2012   CATARACT EXTRACTION W/ INTRAOCULAR LENS  IMPLANT, BILATERAL  2009   CHOLECYSTECTOMY  12/17/2010   Procedure: LAPAROSCOPIC CHOLECYSTECTOMY;  Surgeon: Thresa JAYSON Pulling;  Location: [facility];  Service: General;  Laterality: N/A;   COLONOSCOPY  05/22/2011   Procedure: COLONOSCOPY;  Surgeon: Claudis RAYMOND Rivet, MD;  Location: [facility];  Service: Endoscopy;  Laterality: N/A;  1045   CYSTOSCOPY WITH RETROGRADE PYELOGRAM, URETEROSCOPY AND STENT PLACEMENT Right 05/09/2019   Procedure: CYSTOSCOPY WITH RETROGRADE PYELOGRAM, URETEROSCOPY AND STENT PLACEMENT;  Surgeon: Elisabeth Valli BIRCH, MD;  Location: [facility];  Service: Urology;  Laterality: Right;  1 HR   CYSTOSCOPY WITH STENT PLACEMENT Right 04/03/2019   Procedure: CYSTOSCOPY, RIGHT RETROGRADE PYELOGRAM  WITH STENT PLACEMENT;  Surgeon: Elisabeth Valli BIRCH, MD;   Location: [facility];  Service: Urology;  Laterality: Right;   EXCISION BONE CYST Right 2010   RIGHT SHOULDER   GANGLION CYST EXCISION Left 06/02/2012   Procedure: Excision ganglion left dorsal wrist;  Surgeon: Lemond Stable, MD;  Location: [facility];  Service: Orthopedics;  Laterality: Left;   HOLMIUM LASER APPLICATION Right 05/09/2019   Procedure: HOLMIUM LASER APPLICATION;  Surgeon: Elisabeth Valli BIRCH, MD;  Location: [facility];  Service: Urology;  Laterality: Right;   kidney stones     LUMBAR MICRODISCECTOMY  07-16-2000   @[facility]    L 2  -3   POSTERIOR LAMINECTOMY / DECOMPRESSION LUMBAR SPINE  06-26-2009   @[facility]    w/ LATERAL MASS FUSION , L3 -- 5   SHOULDER ARTHROSCOPY WITH DISTAL CLAVICLE RESECTION Right 01-10-2009   @[facility]    w/ DEBRIDEMENT AND EXCISION RECURRENT GANGLION CYST    Family History  Problem Relation Age of Onset   Stroke Mother    Cancer Father    Diabetes Sister    Diabetes Sister    Kidney failure Brother    Kidney failure Brother    Pancreatic cancer Sister    Kidney Stones Sister        Only one Kidney   Arthritis Sister    Heart disease Daughter      Social Connections: Moderately Integrated (08/09/2023)   Social Connection and Isolation Panel    Frequency of Communication with Friends and Family: More than three times a week    Frequency of Social Gatherings with Friends and Family: More than three times a week    Attends Religious Services: More than 4 times per year    Active Member of [employer] or Organizations: Yes    Attends Banker Meetings: More than 4 times per year    Marital Status: Widowed      Current Outpatient Medications:    acetaminophen  (TYLENOL ) 500 MG tablet, Take 1,000 mg by mouth every 6 (six) hours as needed (for pain). , Disp: , Rfl:    albuterol  (VENTOLIN  HFA) 108 (90 Base) MCG/ACT inhaler, TAKE 2 PUFFS BY MOUTH EVERY 6 HOURS AS NEEDED FOR WHEEZE OR SHORTNESS OF BREATH (Patient taking differently: as needed.), Disp: 18  each, Rfl: 2   alendronate  (FOSAMAX ) 70 MG tablet, TAKE 1 TABLET BY MOUTH EVERY 7 DAYS. TAKE WITH A FULL GLASS OF WATER  ON AN EMPTY STOMACH., Disp: 12 tablet, Rfl: 1   alendronate  (FOSAMAX ) 70 MG tablet, Take 1 tablet (70 mg total) by mouth every 7 (seven) days. Take with a full glass of water  on an empty stomach., Disp: 4 tablet, Rfl: 11   amLODipine  (NORVASC ) 5 MG tablet, TAKE 1 TABLET BY MOUTH EVERY DAY, Disp: 90 tablet, Rfl: 0   aspirin  EC 81 MG tablet, Take 81 mg by mouth daily. Swallow whole., Disp: , Rfl:    cetirizine  (ZYRTEC ) 10 MG tablet, TAKE 1 TABLET BY MOUTH EVERY DAY, Disp: 90 tablet, Rfl: 1   fluticasone  (FLONASE )  50 MCG/ACT nasal spray, SPRAY 2 SPRAYS INTO EACH NOSTRIL EVERY DAY, Disp: 48 mL, Rfl: 1   folic acid  (FOLVITE ) 1 MG tablet, TAKE 1 TABLET BY MOUTH EVERY DAY, Disp: 90 tablet, Rfl: 3   gabapentin  (NEURONTIN ) 100 MG capsule, Take 1 capsule (100 mg total) by mouth at bedtime., Disp: 90 capsule, Rfl: 3   hydrochlorothiazide  (MICROZIDE ) 12.5 MG capsule, TAKE 1 CAPSULE BY MOUTH EVERY DAY, Disp: 90 capsule, Rfl: 1   methotrexate  (RHEUMATREX) 2.5 MG tablet, TAKE 3 TABLETS BY MOUTH ONE TIME PER WEEK, Disp: 36 tablet, Rfl: 0   montelukast  (SINGULAIR ) 10 MG tablet, TAKE 1 TABLET BY MOUTH EVERYDAY AT BEDTIME, Disp: 90 tablet, Rfl: 1   potassium chloride  SA (KLOR-CON  M) 20 MEQ tablet, TAKE 1 TABLET BY MOUTH TWICE A DAY, Disp: 180 tablet, Rfl: 0   predniSONE  (DELTASONE ) 1 MG tablet, TAKE 4 TABLETS (4 MG TOTAL) BY MOUTH DAILY WITH BREAKFAST, Disp: 360 tablet, Rfl: 0   Propylene Glycol (SYSTANE BALANCE OP), Place 1 drop into both eyes daily as needed (for dry eyes). , Disp: , Rfl:    rosuvastatin  (CRESTOR ) 5 MG tablet, TAKE 1 TABLET (5 MG TOTAL) BY MOUTH DAILY., Disp: 90 tablet, Rfl: 1   Physical Exam:   BP 126/78   Pulse 76   Temp (!) 97.4 F (36.3 C)   Ht 4' 11.5 (1.511 m)   Wt 137 lb (62.1 kg)   SpO2 94%   BMI 27.21 kg/m   Pertinent Findings  CN II-XII grossly intact Right  cerumen impaction, left EAC clear and TM intact with well bilateral opaque TM Anterior rhinoscopy: Septum midline; bilateral inferior turbinates with no hypertrophy No lesions of oral cavity/oropharynx; dentition WNL, torus noted  No obviously palpable neck masses/lymphadenopathy/thyromegaly No respiratory distress or stridor   Seprately Identifiable Procedures:  Procedure: bilateral ear microscopy and cerumen removal using microscope (CPT 30789) - Mod 25 Pre-procedure diagnosis: unilateral cerumen impaction right external auditory canal Post-procedure diagnosis: same Indication: right cerumen impaction; given patient's otologic complaints and history as well as for improved and comprehensive examination of external ear and tympanic membrane, bilateral otologic examination using microscope was performed and impacted cerumen removed  Procedure: Patient was placed semi-recumbent. Both ear canals were examined using the microscope with findings above. Cerumen removed from the right external auditory canal using suction and currette with improvement in EAC examination and patency. Left: EAC was patent. TM was intact . Middle ear was aerated. Drainage: none Right: EAC was patent. TM was intact . Middle ear was aerated . Drainage: none Patient tolerated the procedure well.   Impression & Plans:  Jazmon Kos is a 89 y.o. female with the following   Assessment and Plan    Right ear cerumen impaction with bilateral hearing loss Cerumen impaction in the right ear likely exacerbated bilateral hearing loss. - Performed cerumen removal from the right ear. - audio shows bilateral SNHL, recommend hearing aids     - f/u PRN   Thank you for allowing me the opportunity to care for your patient. Please do not hesitate to contact me should you have any other questions.  Sincerely, Chyrl Cohen PA-C [facility] Phone: [phone] Fax: [phone]  11/16/2023, 3:06 PM

## 2023-11-16 NOTE — Progress Notes (Unsigned)
  [address] [place], [place] [address] [phone]  Audiological Evaluation    Name: Stacy Meyers     DOB:   Feb 10, 1932      MRN:   [id number]                                                                                     Service Date: 11/16/2023     Accompanied by: grandson   Patient comes today after Reyes Cohen, PA-C sent a referral for a hearing evaluation due to concerns with hearing loss.   Symptoms Yes Details  Hearing loss  [x]  Hearing loss in both ears  Tinnitus  []    Ear pain/ infections/pressure  []    Balance problems  [x]  A little lightheaded with quick movements  Noise exposure history  []    Previous ear surgeries  []    Family history of hearing loss  []    Amplification  []    Other  []  Wax in the right ear was removed before completing the hearing test    Otoscopy: Right ear: Clear external ear canal and notable landmarks visualized on the tympanic membrane. Left ear:  Clear external ear canal and notable landmarks visualized on the tympanic membrane.  Tympanometry: Right ear: Normal external ear canal volume with normal middle ear pressure and tympanic membrane compliance (Type A). Findings are suggestive of normal middle ear function. Left ear: Normal external ear canal volume with normal middle ear pressure and tympanic membrane compliance (Type A). Findings are suggestive of normal middle ear function.  Hearing Evaluation The hearing test results were completed under headphones and re-checked with inserts and results are deemed to be of good to fair reliability. Test technique:  conventional    Pure tone Audiometry: Right ear- Normal hearing from 365-120-0898 Hz, then moderate to severe sensorineural hearing loss from 3000 Hz - 80000 Hz. Left ear-  Normal hearing from 365-120-0898 Hz, then moderate to severe sensorineural hearing loss from 3000 Hz - 80000 Hz.  Speech Audiometry: Right ear- Speech Reception Threshold (SRT) was obtained at  20 dBHL. Left ear-Speech Reception Threshold (SRT) was obtained at 15 dBHL.   Word Recognition Score Tested using NU-6 (recorded) Right ear: 88% was obtained at a presentation level of 85 dBHL with contralateral masking which is deemed as  good . Left ear: 92% was obtained at a presentation level of 85 dBHL with contralateral masking which is deemed as  excellent.   Impression: There is not a significant difference in pure-tone thresholds between ears. There is not a significant difference in the word recognition score in between ears.    Recommendations: Follow up with ENT as scheduled for today. Return for a hearing evaluation if concerns with hearing changes arise or per MD recommendation. Consider a communication needs assessment after medical clearance for hearing aids is obtained.   Jose Alleyne MARIE LEROUX-MARTINEZ, AUD

## 2023-11-17 LAB — MICROSCOPIC EXAMINATION: Bacteria, UA: NONE SEEN

## 2023-11-17 LAB — URINALYSIS, ROUTINE W REFLEX MICROSCOPIC
Bilirubin, UA: NEGATIVE
Glucose, UA: NEGATIVE
Ketones, UA: NEGATIVE
Nitrite, UA: NEGATIVE
Protein,UA: NEGATIVE
Specific Gravity, UA: 1.01 (ref 1.005–1.030)
Urobilinogen, Ur: 0.2 mg/dL (ref 0.2–1.0)
pH, UA: 7 (ref 5.0–7.5)

## 2023-11-22 ENCOUNTER — Other Ambulatory Visit: Payer: Self-pay | Admitting: Family Medicine

## 2023-11-22 ENCOUNTER — Other Ambulatory Visit: Payer: Self-pay | Admitting: Physician Assistant

## 2023-11-22 DIAGNOSIS — M0579 Rheumatoid arthritis with rheumatoid factor of multiple sites without organ or systems involvement: Secondary | ICD-10-CM

## 2023-11-22 DIAGNOSIS — I10 Essential (primary) hypertension: Secondary | ICD-10-CM

## 2023-11-22 DIAGNOSIS — R7989 Other specified abnormal findings of blood chemistry: Secondary | ICD-10-CM

## 2023-11-22 DIAGNOSIS — E785 Hyperlipidemia, unspecified: Secondary | ICD-10-CM

## 2023-11-22 DIAGNOSIS — E559 Vitamin D deficiency, unspecified: Secondary | ICD-10-CM

## 2023-11-22 DIAGNOSIS — E669 Obesity, unspecified: Secondary | ICD-10-CM

## 2023-11-22 MED ORDER — PREDNISONE 1 MG PO TABS: ORAL_TABLET | ORAL | 0 refills | Status: AC

## 2023-11-22 NOTE — Telephone Encounter (Signed)
 Last Fill: 07/12/2023  Next Visit: 01/04/2024  Last Visit: 08/02/2023  Dx: Rheumatoid arthritis with rheumatoid factor of multiple sites without organ or systems involvement   Current Dose per office note on 08/02/2023: prednisone  1 mg 4 tablets daily   Okay to refill Prednisone ?

## 2023-11-23 ENCOUNTER — Telehealth: Payer: Self-pay

## 2023-11-23 ENCOUNTER — Ambulatory Visit (INDEPENDENT_AMBULATORY_CARE_PROVIDER_SITE_OTHER)

## 2023-11-23 VITALS — BP 145/71 | HR 74 | Ht 59.0 in | Wt 138.1 lb

## 2023-11-23 DIAGNOSIS — J0100 Acute maxillary sinusitis, unspecified: Secondary | ICD-10-CM

## 2023-11-23 MED ORDER — AMOXICILLIN 500 MG PO CAPS: 500.0000 mg | ORAL_CAPSULE | Freq: Three times a day (TID) | ORAL | 0 refills | Status: AC

## 2023-11-23 NOTE — Progress Notes (Unsigned)
 Established Patient Office Visit  Subjective   Patient ID: Stacy Meyers, female    DOB: 1932/06/27  Age: 89 y.o. MRN: [id number]  Chief Complaint  Patient presents with   Medical Management of Chronic Issues    Pt here for a cold for about 8 days now     HPI Discussed the use of AI scribe software for clinical note transcription with the patient, who gave verbal consent to proceed.  History of Present Illness    Stacy Meyers is an 89 year old female who presents with a persistent cold and cough.  Upper respiratory symptoms - Persistent cold symptoms for over one week - Cough with mucus production - Uses saline nasal spray for congestion, especially in dry air  Musculoskeletal pain - Generalized soreness attributed to underlying arthritis  Hearing changes - Recent audiology visit for right ear cerumen removal - Improved hearing following ear wax removal     Patient Active Problem List   Diagnosis Date Noted   Perianal erythema 11/15/2023   Immunization due 11/11/2023   Controlled diabetes mellitus type 2 with complications (HCC) 11/11/2023   Type 2 diabetes mellitus with other specified complication (HCC) 08/08/2023   Osteoporosis 08/08/2023   Rheumatoid nodule of left wrist (HCC) 04/20/2023   OAB (overactive bladder) 03/03/2023   Mixed stress and urge urinary incontinence 03/03/2023   Kidney stones 03/03/2023   Renal cyst 03/03/2023   History of recurrent UTIs 03/03/2023   Fall 12/07/2022   Seasonal allergic rhinitis due to pollen 05/27/2022   Reduced vision 01/22/2022   New onset of headaches 01/22/2022   Age related osteoporosis 02/07/2021   Headache 08/26/2020   Prediabetes 10/28/2019   H/o Chronic heart failure with preserved ejection fraction (HFpEF) (HCC) 04/03/2019   Umbilical hernia without obstruction and without gangrene 12/10/2018   Fibroids 05/08/2016   Spondylosis of lumbar region without myelopathy or radiculopathy 12/24/2015   Solitary bone  cyst of right shoulder 05/28/2015   Right knee pain 05/28/2015   Overweight (BMI 25.0-29.9) 04/28/2014   Ganglion cyst of wrist 05/11/2012   Systolic murmur 04/16/2011   Diastolic dysfunction 12/15/2010   Hypokalemia 12/14/2010   Hypercalcemia 12/14/2010   Seasonal allergies 08/28/2010   Herpes simplex virus (HSV) infection 09/16/2008   Rheumatoid arthritis with rheumatoid factor of multiple sites without organ or systems involvement (HCC) 09/16/2008   Mixed hyperlipidemia 05/23/2008   Essential hypertension, benign 05/23/2008      ROS    Objective:     BP (!) 145/71   Pulse 74   Ht 4' 11 (1.499 m)   Wt 138 lb 1.3 oz (62.6 kg)   SpO2 94%   BMI 27.89 kg/m  BP Readings from Last 3 Encounters:  11/23/23 (!) 145/71  11/16/23 (!) 153/68  11/16/23 126/78   Wt Readings from Last 3 Encounters:  11/23/23 138 lb 1.3 oz (62.6 kg)  11/16/23 137 lb (62.1 kg)  11/11/23 139 lb (63 kg)     Physical Exam Vitals and nursing note reviewed.  Constitutional:      Appearance: Normal appearance.  HENT:     Head: Normocephalic.     Right Ear: Tympanic membrane, ear canal and external ear normal.     Left Ear: Tympanic membrane, ear canal and external ear normal.     Nose:     Right Turbinates: Swollen.     Left Turbinates: Swollen.     Right Sinus: Maxillary sinus tenderness present.     Left Sinus:  Maxillary sinus tenderness present.     Mouth/Throat:     Mouth: Mucous membranes are moist.     Pharynx: Oropharynx is clear.  Eyes:     Extraocular Movements: Extraocular movements intact.     Pupils: Pupils are equal, round, and reactive to light.  Cardiovascular:     Rate and Rhythm: Normal rate and regular rhythm.  Pulmonary:     Effort: Pulmonary effort is normal.     Breath sounds: Normal breath sounds.  Musculoskeletal:     Cervical back: Normal range of motion and neck supple.  Skin:    General: Skin is warm and dry.  Neurological:     Mental Status: She is alert and  oriented to person, place, and time.  Psychiatric:        Mood and Affect: Mood normal.        Thought Content: Thought content normal.      No results found for any visits on 11/23/23.    The ASCVD Risk score (Arnett DK, et al., 2019) failed to calculate for the following reasons:   The 2019 ASCVD risk score is only valid for ages 88 to 54   Risk score cannot be calculated because patient has a medical history suggesting prior/existing ASCVD    Assessment & Plan:   Problem List Items Addressed This Visit   None Visit Diagnoses       Acute non-recurrent maxillary sinusitis    -  Primary   - Prescribed amoxicillin  for 7 days. - Recommended saline nasal spray for congestion relief. - Advised Coricidin for cough, safe for hypertension.   Relevant Medications   amoxicillin  (AMOXIL ) 500 MG capsule       No follow-ups on file.    Leita Longs, FNP

## 2023-11-23 NOTE — Telephone Encounter (Signed)
 Copied from CRM [id number]. Topic: General - Running Late >> Nov 23, 2023 11:16 AM Stacy Meyers wrote: Patient/patient representative is calling because they are running late for an appointment.

## 2023-11-23 NOTE — Patient Instructions (Addendum)
 Recommend Coricidin HBP Cough medicine and saline nasal spray as needed for cough and congestion.

## 2023-12-06 DIAGNOSIS — H04123 Dry eye syndrome of bilateral lacrimal glands: Secondary | ICD-10-CM | POA: Diagnosis not present

## 2023-12-08 ENCOUNTER — Other Ambulatory Visit: Payer: Self-pay | Admitting: "Endocrinology

## 2023-12-08 ENCOUNTER — Other Ambulatory Visit: Payer: Self-pay | Admitting: Neurology

## 2023-12-21 NOTE — Progress Notes (Unsigned)
 "  Office Visit Note  Patient: Stacy Meyers             Date of Birth: 03-22-1932           MRN: [id number]             PCP: Antonetta Rollene BRAVO, MD Referring: Antonetta Rollene BRAVO, MD Visit Date: 01/04/2024 Occupation: Data Unavailable  Subjective:  Medication monitoring   History of Present Illness: Stacy Meyers is a 89 y.o. female with history of seropositive rheumatoid arthritis and osteoarthritis.  Patient remains on  Methotrexate  3 tablets by mouth every 7 days, folic acid  1 mg daily, and prednisone  1 mg 4 tablets daily.  She is tolerating combination therapy without any side effects and has not had any gaps in therapy.  She denies any morning stiffness or difficulty performing ADLs.  She continues to use a cane to assist with ambulation.  She denies any joint swelling at this time.  She denies any signs or symptoms of a rheumatoid arthritis flare recently.  She denies any new medical conditions.  She denies any recent or recurrent infections.   Activities of Daily Living:  Patient reports morning stiffness for  none.   Patient Reports nocturnal pain.  Difficulty dressing/grooming: Denies Difficulty climbing stairs: Denies Difficulty getting out of chair: Reports Difficulty using hands for taps, buttons, cutlery, and/or writing: Reports  Review of Systems  Constitutional:  Negative for fatigue.  HENT:  Positive for mouth dryness. Negative for mouth sores.   Eyes:  Positive for dryness.  Respiratory:  Negative for shortness of breath.   Cardiovascular:  Negative for chest pain and palpitations.  Gastrointestinal:  Negative for blood in stool, constipation and diarrhea.  Endocrine: Negative for increased urination.  Genitourinary:  Negative for involuntary urination.  Musculoskeletal:  Positive for joint pain, joint pain, joint swelling, myalgias and myalgias. Negative for gait problem, muscle weakness, morning stiffness and muscle tenderness.  Skin:  Positive for hair loss.  Negative for color change, rash and sensitivity to sunlight.  Allergic/Immunologic: Negative for susceptible to infections.  Neurological:  Negative for dizziness and headaches.  Hematological:  Negative for swollen glands.  Psychiatric/Behavioral:  Negative for depressed mood and sleep disturbance. The patient is not nervous/anxious.     PMFS History:  Patient Active Problem List   Diagnosis Date Noted   Perianal erythema 11/15/2023   Immunization due 11/11/2023   Controlled diabetes mellitus type 2 with complications (HCC) 11/11/2023   Type 2 diabetes mellitus with other specified complication (HCC) 08/08/2023   Osteoporosis 08/08/2023   Rheumatoid nodule of left wrist (HCC) 04/20/2023   OAB (overactive bladder) 03/03/2023   Mixed stress and urge urinary incontinence 03/03/2023   Kidney stones 03/03/2023   Renal cyst 03/03/2023   History of recurrent UTIs 03/03/2023   Fall 12/07/2022   Seasonal allergic rhinitis due to pollen 05/27/2022   Reduced vision 01/22/2022   New onset of headaches 01/22/2022   Age related osteoporosis 02/07/2021   Headache 08/26/2020   Prediabetes 10/28/2019   H/o Chronic heart failure with preserved ejection fraction (HFpEF) (HCC) 04/03/2019   Umbilical hernia without obstruction and without gangrene 12/10/2018   Fibroids 05/08/2016   Spondylosis of lumbar region without myelopathy or radiculopathy 12/24/2015   Solitary bone cyst of right shoulder 05/28/2015   Right knee pain 05/28/2015   Overweight (BMI 25.0-29.9) 04/28/2014   Ganglion cyst of wrist 05/11/2012   Systolic murmur 04/16/2011   Diastolic dysfunction 12/15/2010   Hypokalemia 12/14/2010  Hypercalcemia 12/14/2010   Seasonal allergies 08/28/2010   Herpes simplex virus (HSV) infection 09/16/2008   Rheumatoid arthritis with rheumatoid factor of multiple sites without organ or systems involvement (HCC) 09/16/2008   Mixed hyperlipidemia 05/23/2008   Essential hypertension, benign  05/23/2008    Past Medical History:  Diagnosis Date   Acid reflux    Acute cystitis with hematuria 04/11/2011   Anemia    ARF (acute renal failure) 12/14/2010   Arthritis    CKD (chronic kidney disease), stage II    previously seen by nephrologist--- dr perri,  pt released per lov in epic 09-24-2017   Diabetes mellitus, type II (HCC)    Diverticulosis of colon    Essential hypertension, benign    followed by pcp   (pt had nuclear stress test (in epic under media) dated 05-25-2008 showed normal perfusion w/ no evidence ischemia, ef 83%   Heart disease    History of bradycardia    previously seen by cardiologist, dr branch, pt released per lov 08-11-2018 in epic, bradycardia resolved   History of sepsis    04-03-2019 hospital admission w/ urosepsis due to obstructive uropathy due to right ureter stone   Hypokalemia    Lipoma of colon    ascending   Lumbar spondylosis    Mixed hyperlipidemia    Nephrolithiasis    per CT 04-03-2019 nonobstructive bilateral stones   Ocular herpes    Osteoarthritis    followed by dr dolphus--- hands, shoulders, feet   Osteoporosis    Rheumatoid arthritis(714.0)    rheumotologist-- dr s. dolphus---  multiple sites, treated with methotrexate / prednisone    Right nephrolithiasis-1.4cm Moderate right-sided urinary tract obstruction/hydronephrosis 04/03/2019   Right ureteral calculus    Seasonal allergies    Sepsis (HCC) 04/03/2019   Sepsis secondary to UTI /Nephrolithiasis with obstructive uropathy 04/03/2019   SUI (stress urinary incontinence, female)    Uterine fibroid     Family History  Problem Relation Age of Onset   Stroke Mother    Cancer Father    Diabetes Sister    Diabetes Sister    Kidney failure Brother    Kidney failure Brother    Pancreatic cancer Sister    Kidney Stones Sister        Only one Kidney   Arthritis Sister    Heart disease Daughter    Past Surgical History:  Procedure Laterality Date   BUNIONECTOMY Right 2012    CATARACT EXTRACTION W/ INTRAOCULAR LENS  IMPLANT, BILATERAL  2009   CHOLECYSTECTOMY  12/17/2010   Procedure: LAPAROSCOPIC CHOLECYSTECTOMY;  Surgeon: Thresa JAYSON Pulling;  Location: [facility];  Service: General;  Laterality: N/A;   COLONOSCOPY  05/22/2011   Procedure: COLONOSCOPY;  Surgeon: Claudis RAYMOND Rivet, MD;  Location: [facility];  Service: Endoscopy;  Laterality: N/A;  1045   CYSTOSCOPY WITH RETROGRADE PYELOGRAM, URETEROSCOPY AND STENT PLACEMENT Right 05/09/2019   Procedure: CYSTOSCOPY WITH RETROGRADE PYELOGRAM, URETEROSCOPY AND STENT PLACEMENT;  Surgeon: Elisabeth Valli BIRCH, MD;  Location: [facility];  Service: Urology;  Laterality: Right;  1 HR   CYSTOSCOPY WITH STENT PLACEMENT Right 04/03/2019   Procedure: CYSTOSCOPY, RIGHT RETROGRADE PYELOGRAM  WITH STENT PLACEMENT;  Surgeon: Elisabeth Valli BIRCH, MD;  Location: [facility];  Service: Urology;  Laterality: Right;   EXCISION BONE CYST Right 2010   RIGHT SHOULDER   GANGLION CYST EXCISION Left 06/02/2012   Procedure: Excision ganglion left dorsal wrist;  Surgeon: Lemond Stable, MD;  Location: [facility];  Service: Orthopedics;  Laterality: Left;   HOLMIUM LASER APPLICATION Right 05/09/2019   Procedure: HOLMIUM LASER APPLICATION;  Surgeon: Elisabeth Valli BIRCH, MD;  Location: [facility];  Service: Urology;  Laterality: Right;   kidney stones     LUMBAR MICRODISCECTOMY  07-16-2000   @[facility]    L 2  -3   POSTERIOR LAMINECTOMY / DECOMPRESSION LUMBAR SPINE  06-26-2009   @[facility]    w/ LATERAL MASS FUSION , L3 -- 5   SHOULDER ARTHROSCOPY WITH DISTAL CLAVICLE RESECTION Right 01-10-2009   @[facility]    w/ DEBRIDEMENT AND EXCISION RECURRENT GANGLION CYST   Social History[1] Social History   Social History Narrative   Husband passed away 2016-05-01. It has been financially harder to pay the bills since his passing, but she is making it. Has grandchildren around which makes it easier.      Immunization History  Administered Date(s) Administered    Fluad Quad(high Dose 65+) 10/19/2019, 09/25/2020, 10/01/2021   INFLUENZA, HIGH DOSE SEASONAL PF 09/29/2016, 09/15/2018, 10/01/2021, 09/27/2022, 11/11/2023   Influenza Split 11/07/2013   Influenza Whole 10/28/2009, 09/17/2010   Influenza,inj,Quad PF,6+ Mos 09/03/2014   Influenza-Unspecified 10/05/2013, 09/29/2016   Moderna Covid-19 Fall Seasonal Vaccine 11yrs & older 10/19/2021, 09/27/2022   Moderna Covid-19 Vaccine Bivalent Booster 57yrs & up 11/01/2020   Moderna Sars-Covid-2 Vaccination 01/17/2019, 02/27/2019, 10/30/2019, 04/28/2020, 10/19/2021   PPD Test 05/14/2014, 05/28/2015   Pneumococcal Conjugate-13 08/03/2013   Pneumococcal Polysaccharide-23 08/28/2009   Tdap 04/29/2010     Objective: Vital Signs: BP 134/88   Pulse (!) 51   Temp (!) 96.9 F (36.1 C)   Resp 17   Ht 4' 11 (1.499 m)   Wt 140 lb 3.2 oz (63.6 kg)   BMI 28.32 kg/m    Physical Exam Vitals and nursing note reviewed.  Constitutional:      Appearance: She is well-developed.  HENT:     Head: Normocephalic and atraumatic.  Eyes:     Conjunctiva/sclera: Conjunctivae normal.  Cardiovascular:     Rate and Rhythm: Normal rate and regular rhythm.     Heart sounds: Normal heart sounds.  Pulmonary:     Effort: Pulmonary effort is normal.     Breath sounds: Normal breath sounds.  Abdominal:     General: Bowel sounds are normal.     Palpations: Abdomen is soft.  Musculoskeletal:     Cervical back: Normal range of motion.  Lymphadenopathy:     Cervical: No cervical adenopathy.  Skin:    General: Skin is warm and dry.     Capillary Refill: Capillary refill takes less than 2 seconds.  Neurological:     Mental Status: She is alert and oriented to person, place, and time.  Psychiatric:        Behavior: Behavior normal.      Musculoskeletal Exam: C-spine has limited range of motion without rotation.  Thoracic kyphosis noted.  Severely limited range of motion of both shoulders with limited abduction to about 30  degrees.  Elbow joints have good range of motion.  Cyst versus nodule on the radial aspect of the left wrist noted.  Ulnar deviation of the MCP joints.  No tenderness or synovitis across MCP or PIP joints.  Knee joints have good range of motion with no warmth or effusion.  Ankle joints have good range of motion with no tenderness.  Pedal edema noted bilateral lower extremities.  CDAI Exam: CDAI Score: -- Patient Global: --; Provider Global: -- Swollen: --; Tender: -- Joint Exam 01/04/2024  No joint exam has been documented for this visit   There is currently no information documented on the homunculus. Go to the Rheumatology activity and complete the homunculus joint exam.  Investigation: No additional findings.  Imaging: No results found.  Recent Labs: Lab Results  Component Value Date   WBC 6.3 07/30/2023   HGB 11.8 07/30/2023   PLT 268 07/30/2023   NA 140 09/23/2023   K 3.6 09/23/2023   CL 103 09/23/2023   CO2 24 09/23/2023   GLUCOSE 101 (H) 09/23/2023   BUN 21 09/23/2023   CREATININE 0.90 09/23/2023   BILITOT 1.0 09/23/2023   ALKPHOS 55 09/23/2023   AST 19 09/23/2023   ALT 12 09/23/2023   PROT 7.1 09/23/2023   ALBUMIN  4.1 09/23/2023   CALCIUM  10.5 (H) 09/23/2023   CALCIUM  10.2 09/23/2023   GFRAA 70 03/12/2020    Speciality Comments: No specialty comments available.  Procedures:  No procedures performed Allergies: Aspirin  and Sudafed [pseudoephedrine hcl]   Assessment / Plan:     Visit Diagnoses: Rheumatoid arthritis with rheumatoid factor of multiple sites without organ or systems involvement (HCC) - longstanding history of rheumatoid arthritis: She has no synovitis on examination today.  She has not had any signs or symptoms of a rheumatoid arthritis flare.  She has not been experiencing any morning stiffness.  She has clinically been doing well taking methotrexate  3 tablets by mouth once weekly and folic acid  1 mg daily.  She remains on prednisone  4 mg daily.   She has not had any gaps in therapy.  No recent or recurrent infections.  No new medical conditions.   No medication changes will be made at this time.  Refills of methotrexate  and folic acid  will be sent to the pharmacy today. She was advised to notify us  if she develops signs or symptoms of a flare.  She will follow-up in the office in 5 months or sooner if needed.  High risk medication use - Methotrexate  3 tablets by mouth every 7 days, folic acid  1 mg daily, and prednisone  1 mg 4 tablets daily.  CBC and CMP updated today. Her next lab work will be due in March and every 3 months to monitor for drug toxicity. Hemoglobin A1c was updated on 09/16/2023. No recent or recurrent infections.  Discussed the importance of holding methotrexate  if she develops signs or symptoms of an infection and to resume once the infection has completely cleared.  - Plan: CBC with Differential/Platelet, Comprehensive metabolic panel with GFR  Primary osteoarthritis of both hands - Ulnar deviation noted.  Cyst vs. Nodule on the radial aspect of the left wrist. No synovitis noted.  Primary osteoarthritis of both shoulders: Severely limited range of motion of both shoulders.  Primary osteoarthritis of both knees: She has good range of motion of both knee joints on examination today.  No warmth or effusion noted.  Primary osteoarthritis of both feet: She has good range of motion of both ankle joints with no tenderness or joint swelling.  Some pedal edema noted in bilateral lower extremities.  Spondylosis of lumbar region without myelopathy or radiculopathy: No symptoms of radiculopathy at this time.  Using cane to assist with ambulation.  Age-related osteoporosis without current pathological fracture - DEXA 08/06/21:radius 33% 0.569 T-score -2.0.DEXA 07/31/19 Dr. Simpson:Left femur Neck:T-score -2.0, BMD 0.764. DEXA updated on 09/17/2023: Left femoral neck T-score -1.5.  Right femoral neck T-score -1.8.  Right forearm  T-score -2.7.  Under the care of Dr. Lenis.  She  is currently taking Fosamax  70 mg 1 tablet by mouth once weekly.  Other medical conditions are listed as follows:  Type 2 diabetes, diet controlled (HCC)  Essential hypertension: Blood pressure was 134/88 today in office.  Systolic murmur    Orders: Orders Placed This Encounter  Procedures   CBC with Differential/Platelet   Comprehensive metabolic panel with GFR   No orders of the defined types were placed in this encounter.   Follow-Up Instructions: Return in about 5 months (around 06/03/2024) for Rheumatoid arthritis.   Waddell CHRISTELLA Craze, PA-C  Note - This record has been created using Dragon software.  Chart creation errors have been sought, but may not always  have been located. Such creation errors do not reflect on  the standard of medical care.     [1]  Social History Tobacco Use   Smoking status: Never    Passive exposure: Past   Smokeless tobacco: Never  Vaping Use   Vaping status: Never Used  Substance Use Topics   Alcohol use: No   Drug use: Never   "

## 2023-12-22 NOTE — Progress Notes (Signed)
° °  12/22/2023  Patient ID: Stacy Meyers, female   DOB: 09/12/1932, 89 y.o.   MRN: [id number]  Pharmacy Quality Measure Review  This patient is appearing on a report for being at risk of failing the adherence measure for cholesterol (statin) medications this calendar year.   Medication: rosuvastatin  5mg  Last fill date: 06/2023 for 90 day supply  Contacted pharmacy to facilitate refills.  Lang Sieve, PharmD, BCGP Clinical Pharmacist  [phone]

## 2023-12-26 ENCOUNTER — Other Ambulatory Visit: Payer: Self-pay | Admitting: Physician Assistant

## 2023-12-26 DIAGNOSIS — Z79899 Other long term (current) drug therapy: Secondary | ICD-10-CM

## 2023-12-26 DIAGNOSIS — M0579 Rheumatoid arthritis with rheumatoid factor of multiple sites without organ or systems involvement: Secondary | ICD-10-CM

## 2023-12-27 NOTE — Telephone Encounter (Signed)
 Last Fill: 09/13/2023  Labs: 09/23/2023 CMP Glucose 101  07/30/2023 CBC MCHC 31.2  Next Visit: 01/04/2024  Last Visit: 08/02/2023  DX:  Rheumatoid arthritis with rheumatoid factor of multiple sites without organ or systems involvement (HCC)   Current Dose per office note 08/02/2023: Methotrexate  3 tablets by mouth every 7 days   Patient to update labs at upcomming appointment.   Okay to refill Methotrexate ?

## 2024-01-04 ENCOUNTER — Encounter: Payer: Self-pay | Admitting: Physician Assistant

## 2024-01-04 ENCOUNTER — Ambulatory Visit: Attending: Physician Assistant | Admitting: Physician Assistant

## 2024-01-04 VITALS — BP 134/88 | HR 51 | Temp 96.9°F | Resp 17 | Ht 59.0 in | Wt 140.2 lb

## 2024-01-04 DIAGNOSIS — M19012 Primary osteoarthritis, left shoulder: Secondary | ICD-10-CM

## 2024-01-04 DIAGNOSIS — M47816 Spondylosis without myelopathy or radiculopathy, lumbar region: Secondary | ICD-10-CM

## 2024-01-04 DIAGNOSIS — M19011 Primary osteoarthritis, right shoulder: Secondary | ICD-10-CM | POA: Diagnosis not present

## 2024-01-04 DIAGNOSIS — I10 Essential (primary) hypertension: Secondary | ICD-10-CM

## 2024-01-04 DIAGNOSIS — E119 Type 2 diabetes mellitus without complications: Secondary | ICD-10-CM

## 2024-01-04 DIAGNOSIS — M19041 Primary osteoarthritis, right hand: Secondary | ICD-10-CM | POA: Diagnosis not present

## 2024-01-04 DIAGNOSIS — M19071 Primary osteoarthritis, right ankle and foot: Secondary | ICD-10-CM | POA: Diagnosis not present

## 2024-01-04 DIAGNOSIS — R011 Cardiac murmur, unspecified: Secondary | ICD-10-CM

## 2024-01-04 DIAGNOSIS — M19042 Primary osteoarthritis, left hand: Secondary | ICD-10-CM | POA: Diagnosis not present

## 2024-01-04 DIAGNOSIS — Z79899 Other long term (current) drug therapy: Secondary | ICD-10-CM

## 2024-01-04 DIAGNOSIS — M810 Age-related osteoporosis without current pathological fracture: Secondary | ICD-10-CM

## 2024-01-04 DIAGNOSIS — M19072 Primary osteoarthritis, left ankle and foot: Secondary | ICD-10-CM

## 2024-01-04 DIAGNOSIS — M0579 Rheumatoid arthritis with rheumatoid factor of multiple sites without organ or systems involvement: Secondary | ICD-10-CM | POA: Diagnosis not present

## 2024-01-04 DIAGNOSIS — M170 Bilateral primary osteoarthritis of knee: Secondary | ICD-10-CM | POA: Diagnosis not present

## 2024-01-04 LAB — COMPREHENSIVE METABOLIC PANEL WITH GFR
AG Ratio: 1.6 (calc) (ref 1.0–2.5)
ALT: 7 U/L (ref 6–29)
AST: 14 U/L (ref 10–35)
Albumin: 4.2 g/dL (ref 3.6–5.1)
Alkaline phosphatase (APISO): 53 U/L (ref 37–153)
BUN/Creatinine Ratio: 31 (calc) — ABNORMAL HIGH (ref 6–22)
BUN: 28 mg/dL — ABNORMAL HIGH (ref 7–25)
CO2: 29 mmol/L (ref 20–32)
Calcium: 10.4 mg/dL (ref 8.6–10.4)
Chloride: 104 mmol/L (ref 98–110)
Creat: 0.89 mg/dL (ref 0.60–0.95)
Globulin: 2.6 g/dL (ref 1.9–3.7)
Glucose, Bld: 103 mg/dL — ABNORMAL HIGH (ref 65–99)
Potassium: 3.8 mmol/L (ref 3.5–5.3)
Sodium: 141 mmol/L (ref 135–146)
Total Bilirubin: 0.5 mg/dL (ref 0.2–1.2)
Total Protein: 6.8 g/dL (ref 6.1–8.1)
eGFR: 62 mL/min/1.73m2

## 2024-01-04 LAB — CBC WITH DIFFERENTIAL/PLATELET
Absolute Lymphocytes: 1978 {cells}/uL (ref 850–3900)
Absolute Monocytes: 404 {cells}/uL (ref 200–950)
Basophils Absolute: 52 {cells}/uL (ref 0–200)
Basophils Relative: 0.6 %
Eosinophils Absolute: 215 {cells}/uL (ref 15–500)
Eosinophils Relative: 2.5 %
HCT: 38 % (ref 35.9–46.0)
Hemoglobin: 12.5 g/dL (ref 11.7–15.5)
MCH: 29.4 pg (ref 27.0–33.0)
MCHC: 32.9 g/dL (ref 31.6–35.4)
MCV: 89.4 fL (ref 81.4–101.7)
MPV: 9.4 fL (ref 7.5–12.5)
Monocytes Relative: 4.7 %
Neutro Abs: 5951 {cells}/uL (ref 1500–7800)
Neutrophils Relative %: 69.2 %
Platelets: 265 Thousand/uL (ref 140–400)
RBC: 4.25 Million/uL (ref 3.80–5.10)
RDW: 13.6 % (ref 11.0–15.0)
Total Lymphocyte: 23 %
WBC: 8.6 Thousand/uL (ref 3.8–10.8)

## 2024-01-04 MED ORDER — FOLIC ACID 1 MG PO TABS: 1.0000 mg | ORAL_TABLET | Freq: Every day | ORAL | 3 refills | Status: AC

## 2024-01-04 MED ORDER — METHOTREXATE SODIUM 2.5 MG PO TABS: 7.5000 mg | ORAL_TABLET | ORAL | 0 refills | Status: AC

## 2024-01-04 NOTE — Patient Instructions (Signed)
 Standing Labs We placed an order today for your standing lab work.   Please have your standing labs drawn in March and every 3 months   Please have your labs drawn 2 weeks prior to your appointment so that the provider can discuss your lab results at your appointment, if possible.  Please note that you may see your imaging and lab results in MyChart before we have reviewed them. We will contact you once all results are reviewed. Please allow our office up to 72 hours to thoroughly review all of the results before contacting the office for clarification of your results.  WALK-IN LAB HOURS  Monday through Thursday from 8:00 am - 4:30 pm and Friday from 8:00 am-12:00 pm.  Patients with office visits requiring labs will be seen before walk-in labs.  You may encounter longer than normal wait times. Please allow additional time. Wait times may be shorter on  Monday and Thursday afternoons.  We do not book appointments for walk-in labs. We appreciate your patience and understanding with our staff.   Labs are drawn by [facility]. Please bring your co-pay at the time of your lab draw.  You may receive a bill from [facility] for your lab work.  Please note if you are on Hydroxychloroquine and and an order has been placed for a Hydroxychloroquine level,  you will need to have it drawn 4 hours or more after your last dose.  If you wish to have your labs drawn at another location, please call the office 24 hours in advance so we can fax the orders.  The office is located at [address], [place], [place] [address]   If you have any questions regarding directions or hours of operation,  please call [phone].   As a reminder, please drink plenty of water  prior to coming for your lab work. Thanks!

## 2024-01-06 ENCOUNTER — Ambulatory Visit: Payer: Self-pay | Admitting: Physician Assistant

## 2024-01-06 NOTE — Progress Notes (Signed)
CBC WNL. CMP stable.

## 2024-01-27 ENCOUNTER — Telehealth: Payer: Self-pay | Admitting: Neurology

## 2024-01-27 NOTE — Telephone Encounter (Signed)
 Appointment made With wait list

## 2024-03-13 ENCOUNTER — Ambulatory Visit: Admitting: Family Medicine

## 2024-03-16 ENCOUNTER — Ambulatory Visit: Admitting: Family Medicine

## 2024-03-28 ENCOUNTER — Ambulatory Visit: Admitting: "Endocrinology

## 2024-06-01 ENCOUNTER — Ambulatory Visit: Admitting: Rheumatology

## 2024-08-09 ENCOUNTER — Ambulatory Visit: Admitting: Neurology

## 2024-08-15 ENCOUNTER — Ambulatory Visit: Admitting: Neurology

## 2024-10-23 ENCOUNTER — Ambulatory Visit: Admitting: Urology
